# Patient Record
Sex: Male | Born: 1950 | Race: Black or African American | Hispanic: No | Marital: Single | Smoking: Former smoker
Health system: Southern US, Community
[De-identification: ages and names within clinical notes are randomized; demographics above are authoritative.]

## PROBLEM LIST (undated history)

## (undated) DIAGNOSIS — I639 Cerebral infarction, unspecified: Secondary | ICD-10-CM

## (undated) DIAGNOSIS — N2889 Other specified disorders of kidney and ureter: Secondary | ICD-10-CM

## (undated) DIAGNOSIS — F32A Depression, unspecified: Secondary | ICD-10-CM

## (undated) DIAGNOSIS — T4145XA Adverse effect of unspecified anesthetic, initial encounter: Secondary | ICD-10-CM

## (undated) DIAGNOSIS — C3490 Malignant neoplasm of unspecified part of unspecified bronchus or lung: Secondary | ICD-10-CM

## (undated) DIAGNOSIS — F329 Major depressive disorder, single episode, unspecified: Secondary | ICD-10-CM

## (undated) DIAGNOSIS — F419 Anxiety disorder, unspecified: Secondary | ICD-10-CM

## (undated) DIAGNOSIS — T8859XA Other complications of anesthesia, initial encounter: Secondary | ICD-10-CM

## (undated) HISTORY — PX: OTHER SURGICAL HISTORY: SHX169

## (undated) HISTORY — PX: COLONOSCOPY W/ BIOPSIES AND POLYPECTOMY: SHX1376

## (undated) HISTORY — DX: Malignant neoplasm of unspecified part of unspecified bronchus or lung: C34.90

## (undated) HISTORY — DX: Other specified disorders of kidney and ureter: N28.89

## (undated) HISTORY — PX: FOOT AMPUTATION: SHX951

## (undated) NOTE — *Deleted (*Deleted)
Pt had 7 beats of Vtach. Pt asymptomatic. On call provider notified.

---

## 2000-12-23 ENCOUNTER — Ambulatory Visit (HOSPITAL_COMMUNITY): Admission: RE | Admit: 2000-12-23 | Discharge: 2000-12-23 | Payer: Self-pay | Admitting: Orthopedic Surgery

## 2000-12-23 ENCOUNTER — Encounter: Payer: Self-pay | Admitting: Orthopedic Surgery

## 2001-05-15 ENCOUNTER — Encounter: Admission: RE | Admit: 2001-05-15 | Discharge: 2001-05-15 | Payer: Self-pay | Admitting: Orthopedic Surgery

## 2001-05-15 ENCOUNTER — Encounter: Payer: Self-pay | Admitting: Orthopedic Surgery

## 2007-11-22 ENCOUNTER — Encounter: Admission: RE | Admit: 2007-11-22 | Discharge: 2007-11-22 | Payer: Self-pay | Admitting: Gastroenterology

## 2008-01-31 ENCOUNTER — Emergency Department (HOSPITAL_COMMUNITY): Admission: EM | Admit: 2008-01-31 | Discharge: 2008-01-31 | Payer: Self-pay | Admitting: Emergency Medicine

## 2008-02-08 ENCOUNTER — Encounter: Payer: Self-pay | Admitting: Internal Medicine

## 2008-02-08 ENCOUNTER — Ambulatory Visit (HOSPITAL_COMMUNITY): Admission: RE | Admit: 2008-02-08 | Discharge: 2008-02-08 | Payer: Self-pay | Admitting: Internal Medicine

## 2008-02-09 ENCOUNTER — Ambulatory Visit: Payer: Self-pay | Admitting: Internal Medicine

## 2008-02-09 ENCOUNTER — Ambulatory Visit (HOSPITAL_COMMUNITY): Admission: RE | Admit: 2008-02-09 | Discharge: 2008-02-09 | Payer: Self-pay | Admitting: Internal Medicine

## 2008-02-09 DIAGNOSIS — R51 Headache: Secondary | ICD-10-CM | POA: Insufficient documentation

## 2008-02-09 DIAGNOSIS — I10 Essential (primary) hypertension: Secondary | ICD-10-CM | POA: Insufficient documentation

## 2008-02-09 DIAGNOSIS — J449 Chronic obstructive pulmonary disease, unspecified: Secondary | ICD-10-CM

## 2008-02-09 DIAGNOSIS — F329 Major depressive disorder, single episode, unspecified: Secondary | ICD-10-CM

## 2008-02-09 DIAGNOSIS — C3411 Malignant neoplasm of upper lobe, right bronchus or lung: Secondary | ICD-10-CM

## 2008-02-09 DIAGNOSIS — E785 Hyperlipidemia, unspecified: Secondary | ICD-10-CM | POA: Insufficient documentation

## 2008-02-09 DIAGNOSIS — R519 Headache, unspecified: Secondary | ICD-10-CM | POA: Insufficient documentation

## 2008-02-09 LAB — CONVERTED CEMR LAB: Prothrombin Time: 12.1 s (ref 10.9–13.3)

## 2008-02-12 ENCOUNTER — Ambulatory Visit: Admission: RE | Admit: 2008-02-12 | Discharge: 2008-02-12 | Payer: Self-pay | Admitting: Internal Medicine

## 2008-02-12 ENCOUNTER — Encounter: Payer: Self-pay | Admitting: Internal Medicine

## 2008-02-12 ENCOUNTER — Ambulatory Visit: Payer: Self-pay | Admitting: Internal Medicine

## 2008-02-13 ENCOUNTER — Ambulatory Visit: Payer: Self-pay | Admitting: Cardiovascular Disease

## 2008-02-14 ENCOUNTER — Ambulatory Visit: Payer: Self-pay | Admitting: Internal Medicine

## 2008-02-15 ENCOUNTER — Ambulatory Visit: Payer: Self-pay | Admitting: Internal Medicine

## 2008-02-15 ENCOUNTER — Encounter: Payer: Self-pay | Admitting: Internal Medicine

## 2008-02-16 ENCOUNTER — Ambulatory Visit: Admission: RE | Admit: 2008-02-16 | Discharge: 2008-04-22 | Payer: Self-pay | Admitting: Radiation Oncology

## 2008-02-26 LAB — CBC WITH DIFFERENTIAL/PLATELET
EOS%: 3.2 % (ref 0.0–7.0)
Eosinophils Absolute: 0.3 10*3/uL (ref 0.0–0.5)
MCH: 27.8 pg — ABNORMAL LOW (ref 28.0–33.4)
MCV: 85.4 fL (ref 81.6–98.0)
MONO%: 4.8 % (ref 0.0–13.0)
NEUT#: 7.1 10*3/uL — ABNORMAL HIGH (ref 1.5–6.5)
RBC: 4.43 10*6/uL (ref 4.20–5.71)
RDW: 14.9 % — ABNORMAL HIGH (ref 11.2–14.6)

## 2008-02-26 LAB — COMPREHENSIVE METABOLIC PANEL
ALT: 14 U/L (ref 0–53)
AST: 24 U/L (ref 0–37)
Alkaline Phosphatase: 118 U/L — ABNORMAL HIGH (ref 39–117)
Sodium: 135 mEq/L (ref 135–145)
Total Bilirubin: 0.4 mg/dL (ref 0.3–1.2)
Total Protein: 6.6 g/dL (ref 6.0–8.3)

## 2008-02-26 LAB — TECHNOLOGIST REVIEW

## 2008-03-04 LAB — COMPREHENSIVE METABOLIC PANEL
AST: 16 U/L (ref 0–37)
Albumin: 3.7 g/dL (ref 3.5–5.2)
Alkaline Phosphatase: 121 U/L — ABNORMAL HIGH (ref 39–117)
Potassium: 4.3 mEq/L (ref 3.5–5.3)
Sodium: 137 mEq/L (ref 135–145)
Total Protein: 7.5 g/dL (ref 6.0–8.3)

## 2008-03-04 LAB — CBC WITH DIFFERENTIAL/PLATELET
Basophils Absolute: 0 10*3/uL (ref 0.0–0.1)
EOS%: 1.9 % (ref 0.0–7.0)
HGB: 12.4 g/dL — ABNORMAL LOW (ref 13.0–17.1)
MCH: 27.8 pg — ABNORMAL LOW (ref 28.0–33.4)
MCV: 85.3 fL (ref 81.6–98.0)
MONO%: 6.6 % (ref 0.0–13.0)
RBC: 4.44 10*6/uL (ref 4.20–5.71)
RDW: 14.7 % — ABNORMAL HIGH (ref 11.2–14.6)

## 2008-03-04 LAB — TECHNOLOGIST REVIEW

## 2008-03-11 LAB — CBC WITH DIFFERENTIAL/PLATELET
BASO%: 0.6 % (ref 0.0–2.0)
LYMPH%: 13.7 % — ABNORMAL LOW (ref 14.0–48.0)
MCHC: 32.4 g/dL (ref 32.0–35.9)
MONO#: 0.2 10*3/uL (ref 0.1–0.9)
Platelets: 440 10*3/uL — ABNORMAL HIGH (ref 145–400)
RBC: 4.46 10*6/uL (ref 4.20–5.71)
WBC: 5.5 10*3/uL (ref 4.0–10.0)

## 2008-03-11 LAB — COMPREHENSIVE METABOLIC PANEL
ALT: 26 U/L (ref 0–53)
AST: 20 U/L (ref 0–37)
Alkaline Phosphatase: 119 U/L — ABNORMAL HIGH (ref 39–117)
CO2: 24 mEq/L (ref 19–32)
Sodium: 138 mEq/L (ref 135–145)
Total Bilirubin: 0.2 mg/dL — ABNORMAL LOW (ref 0.3–1.2)
Total Protein: 7.1 g/dL (ref 6.0–8.3)

## 2008-03-18 LAB — CBC WITH DIFFERENTIAL/PLATELET
BASO%: 0.1 % (ref 0.0–2.0)
EOS%: 1.3 % (ref 0.0–7.0)
LYMPH%: 8.8 % — ABNORMAL LOW (ref 14.0–48.0)
MCH: 28.3 pg (ref 28.0–33.4)
MCHC: 33.1 g/dL (ref 32.0–35.9)
MCV: 85.6 fL (ref 81.6–98.0)
MONO%: 2.5 % (ref 0.0–13.0)
Platelets: 389 10*3/uL (ref 145–400)
RBC: 4.41 10*6/uL (ref 4.20–5.71)
WBC: 4.5 10*3/uL (ref 4.0–10.0)

## 2008-03-18 LAB — COMPREHENSIVE METABOLIC PANEL
ALT: 15 U/L (ref 0–53)
Alkaline Phosphatase: 113 U/L (ref 39–117)
Creatinine, Ser: 0.68 mg/dL (ref 0.40–1.50)
Sodium: 135 mEq/L (ref 135–145)
Total Bilirubin: 0.3 mg/dL (ref 0.3–1.2)
Total Protein: 7.1 g/dL (ref 6.0–8.3)

## 2008-03-25 ENCOUNTER — Encounter: Payer: Self-pay | Admitting: Internal Medicine

## 2008-03-25 LAB — CBC WITH DIFFERENTIAL/PLATELET
Eosinophils Absolute: 0.1 10*3/uL (ref 0.0–0.5)
HCT: 35.9 % — ABNORMAL LOW (ref 38.4–49.9)
LYMPH%: 8.2 % — ABNORMAL LOW (ref 14.0–49.0)
MCHC: 32.9 g/dL (ref 32.0–36.0)
MCV: 83.3 fL (ref 79.3–98.0)
MONO%: 12.9 % (ref 0.0–14.0)
NEUT%: 76.6 % — ABNORMAL HIGH (ref 39.0–75.0)
Platelets: 201 10*3/uL (ref 140–400)
RBC: 4.31 10*6/uL (ref 4.20–5.82)
nRBC: 0 % (ref 0–0)

## 2008-03-25 LAB — COMPREHENSIVE METABOLIC PANEL
Albumin: 3.8 g/dL (ref 3.5–5.2)
Alkaline Phosphatase: 91 U/L (ref 39–117)
Chloride: 106 mEq/L (ref 96–112)
Glucose, Bld: 115 mg/dL — ABNORMAL HIGH (ref 70–99)
Potassium: 4.7 mEq/L (ref 3.5–5.3)
Sodium: 139 mEq/L (ref 135–145)
Total Protein: 6.9 g/dL (ref 6.0–8.3)

## 2008-04-01 ENCOUNTER — Ambulatory Visit: Payer: Self-pay | Admitting: Internal Medicine

## 2008-04-01 LAB — CBC WITH DIFFERENTIAL/PLATELET
BASO%: 0.9 % (ref 0.0–2.0)
Basophils Absolute: 0 10*3/uL (ref 0.0–0.1)
EOS%: 2.2 % (ref 0.0–7.0)
Eosinophils Absolute: 0.1 10*3/uL (ref 0.0–0.5)
HCT: 36.5 % — ABNORMAL LOW (ref 38.4–49.9)
HGB: 12.2 g/dL — ABNORMAL LOW (ref 13.0–17.1)
LYMPH%: 11.5 % — ABNORMAL LOW (ref 14.0–49.0)
MCH: 27.8 pg (ref 27.2–33.4)
MCHC: 33.4 g/dL (ref 32.0–36.0)
MCV: 83.1 fL (ref 79.3–98.0)
MONO#: 0.3 10*3/uL (ref 0.1–0.9)
MONO%: 9.6 % (ref 0.0–14.0)
NEUT#: 2.4 10*3/uL (ref 1.5–6.5)
NEUT%: 75.8 % — ABNORMAL HIGH (ref 39.0–75.0)
Platelets: 240 10*3/uL (ref 140–400)
RBC: 4.39 10*6/uL (ref 4.20–5.82)
RDW: 16.9 % — ABNORMAL HIGH (ref 11.0–14.6)
WBC: 3.2 10*3/uL — ABNORMAL LOW (ref 4.0–10.3)
lymph#: 0.4 10*3/uL — ABNORMAL LOW (ref 0.9–3.3)
nRBC: 0 % (ref 0–0)

## 2008-04-02 ENCOUNTER — Ambulatory Visit: Payer: Self-pay | Admitting: Internal Medicine

## 2008-04-02 DIAGNOSIS — F17200 Nicotine dependence, unspecified, uncomplicated: Secondary | ICD-10-CM | POA: Insufficient documentation

## 2008-04-08 ENCOUNTER — Encounter: Payer: Self-pay | Admitting: Internal Medicine

## 2008-04-08 LAB — COMPREHENSIVE METABOLIC PANEL
Albumin: 4 g/dL (ref 3.5–5.2)
Alkaline Phosphatase: 96 U/L (ref 39–117)
BUN: 9 mg/dL (ref 6–23)
Calcium: 8.8 mg/dL (ref 8.4–10.5)
Creatinine, Ser: 0.63 mg/dL (ref 0.40–1.50)
Glucose, Bld: 121 mg/dL — ABNORMAL HIGH (ref 70–99)
Potassium: 4 mEq/L (ref 3.5–5.3)

## 2008-04-08 LAB — CBC WITH DIFFERENTIAL/PLATELET
BASO%: 0.6 % (ref 0.0–2.0)
HCT: 36.2 % — ABNORMAL LOW (ref 38.4–49.9)
MCHC: 32.9 g/dL (ref 32.0–36.0)
MONO#: 0.2 10*3/uL (ref 0.1–0.9)
RBC: 4.32 10*6/uL (ref 4.20–5.82)
RDW: 17.8 % — ABNORMAL HIGH (ref 11.0–14.6)
WBC: 3.1 10*3/uL — ABNORMAL LOW (ref 4.0–10.3)
lymph#: 0.4 10*3/uL — ABNORMAL LOW (ref 0.9–3.3)
nRBC: 0 % (ref 0–0)

## 2008-04-22 ENCOUNTER — Encounter: Payer: Self-pay | Admitting: Internal Medicine

## 2008-04-22 LAB — CBC WITH DIFFERENTIAL/PLATELET
Basophils Absolute: 0 10*3/uL (ref 0.0–0.1)
Eosinophils Absolute: 0 10*3/uL (ref 0.0–0.5)
HGB: 12.6 g/dL — ABNORMAL LOW (ref 13.0–17.1)
MCV: 85.7 fL (ref 79.3–98.0)
NEUT#: 2.3 10*3/uL (ref 1.5–6.5)
RDW: 19.7 % — ABNORMAL HIGH (ref 11.0–14.6)
lymph#: 0.6 10*3/uL — ABNORMAL LOW (ref 0.9–3.3)

## 2008-04-22 LAB — COMPREHENSIVE METABOLIC PANEL
Albumin: 4 g/dL (ref 3.5–5.2)
CO2: 27 mEq/L (ref 19–32)
Glucose, Bld: 90 mg/dL (ref 70–99)
Potassium: 4.2 mEq/L (ref 3.5–5.3)
Sodium: 137 mEq/L (ref 135–145)
Total Protein: 7 g/dL (ref 6.0–8.3)

## 2008-05-08 ENCOUNTER — Ambulatory Visit (HOSPITAL_COMMUNITY): Admission: RE | Admit: 2008-05-08 | Discharge: 2008-05-08 | Payer: Self-pay | Admitting: Internal Medicine

## 2008-05-13 ENCOUNTER — Encounter: Payer: Self-pay | Admitting: Internal Medicine

## 2008-05-13 LAB — CBC WITH DIFFERENTIAL/PLATELET
BASO%: 0.1 % (ref 0.0–2.0)
Basophils Absolute: 0 10*3/uL (ref 0.0–0.1)
EOS%: 3.4 % (ref 0.0–7.0)
Eosinophils Absolute: 0.2 10*3/uL (ref 0.0–0.5)
HCT: 41.1 % (ref 38.4–49.9)
HGB: 13.5 g/dL (ref 13.0–17.1)
LYMPH%: 13.6 % — ABNORMAL LOW (ref 14.0–49.0)
MCH: 29.9 pg (ref 27.2–33.4)
MCHC: 32.8 g/dL (ref 32.0–36.0)
MCV: 90.9 fL (ref 79.3–98.0)
MONO#: 0.7 10*3/uL (ref 0.1–0.9)
MONO%: 12.8 % (ref 0.0–14.0)
NEUT#: 3.6 10*3/uL (ref 1.5–6.5)
NEUT%: 70.1 % (ref 39.0–75.0)
Platelets: 406 10*3/uL — ABNORMAL HIGH (ref 140–400)
RBC: 4.52 10*6/uL (ref 4.20–5.82)
RDW: 22 % — ABNORMAL HIGH (ref 11.0–14.6)
WBC: 5.2 10*3/uL (ref 4.0–10.3)
lymph#: 0.7 10*3/uL — ABNORMAL LOW (ref 0.9–3.3)

## 2008-05-13 LAB — COMPREHENSIVE METABOLIC PANEL
Albumin: 4.1 g/dL (ref 3.5–5.2)
BUN: 10 mg/dL (ref 6–23)
CO2: 25 mEq/L (ref 19–32)
Calcium: 9.5 mg/dL (ref 8.4–10.5)
Chloride: 104 mEq/L (ref 96–112)
Creatinine, Ser: 0.85 mg/dL (ref 0.40–1.50)
Glucose, Bld: 78 mg/dL (ref 70–99)
Potassium: 4.4 mEq/L (ref 3.5–5.3)

## 2008-05-15 ENCOUNTER — Ambulatory Visit: Payer: Self-pay | Admitting: Internal Medicine

## 2008-05-20 ENCOUNTER — Ambulatory Visit: Payer: Self-pay | Admitting: Internal Medicine

## 2008-05-22 LAB — COMPREHENSIVE METABOLIC PANEL
Albumin: 3.9 g/dL (ref 3.5–5.2)
BUN: 7 mg/dL (ref 6–23)
CO2: 26 mEq/L (ref 19–32)
Glucose, Bld: 89 mg/dL (ref 70–99)
Potassium: 4.4 mEq/L (ref 3.5–5.3)
Sodium: 138 mEq/L (ref 135–145)
Total Protein: 6.6 g/dL (ref 6.0–8.3)

## 2008-05-22 LAB — CBC WITH DIFFERENTIAL/PLATELET
BASO%: 0 % (ref 0.0–2.0)
Basophils Absolute: 0 10*3/uL (ref 0.0–0.1)
EOS%: 0.3 % (ref 0.0–7.0)
HCT: 38.1 % — ABNORMAL LOW (ref 38.4–49.9)
HGB: 12.4 g/dL — ABNORMAL LOW (ref 13.0–17.1)
MONO#: 1.2 10*3/uL — ABNORMAL HIGH (ref 0.1–0.9)
NEUT%: 90.5 % — ABNORMAL HIGH (ref 39.0–75.0)
RDW: 21.6 % — ABNORMAL HIGH (ref 11.0–14.6)
WBC: 22 10*3/uL — ABNORMAL HIGH (ref 4.0–10.3)
lymph#: 0.8 10*3/uL — ABNORMAL LOW (ref 0.9–3.3)

## 2008-05-29 LAB — CBC WITH DIFFERENTIAL/PLATELET
Eosinophils Absolute: 0 10*3/uL (ref 0.0–0.5)
MONO#: 0.2 10*3/uL (ref 0.1–0.9)
NEUT#: 7.4 10*3/uL — ABNORMAL HIGH (ref 1.5–6.5)
RBC: 4.35 10*6/uL (ref 4.20–5.82)
RDW: 21.4 % — ABNORMAL HIGH (ref 11.0–14.6)
WBC: 8.4 10*3/uL (ref 4.0–10.3)
lymph#: 0.8 10*3/uL — ABNORMAL LOW (ref 0.9–3.3)

## 2008-05-29 LAB — COMPREHENSIVE METABOLIC PANEL
Albumin: 4.1 g/dL (ref 3.5–5.2)
Alkaline Phosphatase: 117 U/L (ref 39–117)
CO2: 24 mEq/L (ref 19–32)
Chloride: 105 mEq/L (ref 96–112)
Glucose, Bld: 81 mg/dL (ref 70–99)
Potassium: 4.4 mEq/L (ref 3.5–5.3)
Sodium: 138 mEq/L (ref 135–145)
Total Protein: 6.7 g/dL (ref 6.0–8.3)

## 2008-06-05 ENCOUNTER — Encounter: Payer: Self-pay | Admitting: Internal Medicine

## 2008-06-05 LAB — COMPREHENSIVE METABOLIC PANEL
ALT: 20 U/L (ref 0–53)
AST: 17 U/L (ref 0–37)
Albumin: 4.2 g/dL (ref 3.5–5.2)
Calcium: 9.3 mg/dL (ref 8.4–10.5)
Chloride: 105 mEq/L (ref 96–112)
Potassium: 4.7 mEq/L (ref 3.5–5.3)
Sodium: 136 mEq/L (ref 135–145)

## 2008-06-05 LAB — CBC WITH DIFFERENTIAL/PLATELET
Basophils Absolute: 0 10*3/uL (ref 0.0–0.1)
Eosinophils Absolute: 0 10*3/uL (ref 0.0–0.5)
HCT: 35.8 % — ABNORMAL LOW (ref 38.4–49.9)
LYMPH%: 4.1 % — ABNORMAL LOW (ref 14.0–49.0)
MCV: 88 fL (ref 79.3–98.0)
MONO#: 0.4 10*3/uL (ref 0.1–0.9)
MONO%: 3.1 % (ref 0.0–14.0)
NEUT#: 10.4 10*3/uL — ABNORMAL HIGH (ref 1.5–6.5)
NEUT%: 92.8 % — ABNORMAL HIGH (ref 39.0–75.0)
Platelets: 301 10*3/uL (ref 140–400)
RBC: 4.07 10*6/uL — ABNORMAL LOW (ref 4.20–5.82)
WBC: 11.2 10*3/uL — ABNORMAL HIGH (ref 4.0–10.3)

## 2008-06-12 LAB — CBC WITH DIFFERENTIAL/PLATELET
BASO%: 0.3 % (ref 0.0–2.0)
EOS%: 0.4 % (ref 0.0–7.0)
MCH: 31 pg (ref 27.2–33.4)
MCHC: 32.8 g/dL (ref 32.0–36.0)
MONO%: 8.5 % (ref 0.0–14.0)
NEUT%: 83.6 % — ABNORMAL HIGH (ref 39.0–75.0)
RDW: 19.7 % — ABNORMAL HIGH (ref 11.0–14.6)
lymph#: 1.1 10*3/uL (ref 0.9–3.3)

## 2008-06-12 LAB — COMPREHENSIVE METABOLIC PANEL
AST: 14 U/L (ref 0–37)
Albumin: 3.7 g/dL (ref 3.5–5.2)
Alkaline Phosphatase: 121 U/L — ABNORMAL HIGH (ref 39–117)
BUN: 6 mg/dL (ref 6–23)
Potassium: 4.2 mEq/L (ref 3.5–5.3)
Sodium: 138 mEq/L (ref 135–145)
Total Bilirubin: 0.2 mg/dL — ABNORMAL LOW (ref 0.3–1.2)

## 2008-06-19 LAB — COMPREHENSIVE METABOLIC PANEL
AST: 14 U/L (ref 0–37)
Alkaline Phosphatase: 147 U/L — ABNORMAL HIGH (ref 39–117)
BUN: 13 mg/dL (ref 6–23)
Creatinine, Ser: 0.96 mg/dL (ref 0.40–1.50)
Glucose, Bld: 92 mg/dL (ref 70–99)
Potassium: 4.7 mEq/L (ref 3.5–5.3)
Total Bilirubin: 0.2 mg/dL — ABNORMAL LOW (ref 0.3–1.2)

## 2008-06-19 LAB — CBC WITH DIFFERENTIAL/PLATELET
Basophils Absolute: 0 10*3/uL (ref 0.0–0.1)
EOS%: 0.1 % (ref 0.0–7.0)
HGB: 12.9 g/dL — ABNORMAL LOW (ref 13.0–17.1)
LYMPH%: 7.2 % — ABNORMAL LOW (ref 14.0–49.0)
MCH: 30.9 pg (ref 27.2–33.4)
MCV: 94.8 fL (ref 79.3–98.0)
MONO%: 3.9 % (ref 0.0–14.0)
NEUT%: 88.6 % — ABNORMAL HIGH (ref 39.0–75.0)
Platelets: 302 10*3/uL (ref 140–400)
RDW: 19.3 % — ABNORMAL HIGH (ref 11.0–14.6)

## 2008-06-26 ENCOUNTER — Encounter: Payer: Self-pay | Admitting: Internal Medicine

## 2008-06-26 LAB — CBC WITH DIFFERENTIAL/PLATELET
Basophils Absolute: 0 10*3/uL (ref 0.0–0.1)
EOS%: 1 % (ref 0.0–7.0)
HCT: 38.4 % (ref 38.4–49.9)
HGB: 12.6 g/dL — ABNORMAL LOW (ref 13.0–17.1)
MCH: 30 pg (ref 27.2–33.4)
MCV: 91.4 fL (ref 79.3–98.0)
MONO%: 16.4 % — ABNORMAL HIGH (ref 0.0–14.0)
NEUT%: 68.5 % (ref 39.0–75.0)
lymph#: 0.8 10*3/uL — ABNORMAL LOW (ref 0.9–3.3)

## 2008-06-26 LAB — COMPREHENSIVE METABOLIC PANEL
AST: 13 U/L (ref 0–37)
BUN: 6 mg/dL (ref 6–23)
Calcium: 8.4 mg/dL (ref 8.4–10.5)
Chloride: 108 mEq/L (ref 96–112)
Creatinine, Ser: 0.75 mg/dL (ref 0.40–1.50)
Glucose, Bld: 108 mg/dL — ABNORMAL HIGH (ref 70–99)

## 2008-06-28 ENCOUNTER — Ambulatory Visit: Payer: Self-pay | Admitting: Internal Medicine

## 2008-07-03 LAB — CBC WITH DIFFERENTIAL/PLATELET
Basophils Absolute: 0 10*3/uL (ref 0.0–0.1)
Eosinophils Absolute: 0.2 10*3/uL (ref 0.0–0.5)
HGB: 12.4 g/dL — ABNORMAL LOW (ref 13.0–17.1)
MONO#: 1.1 10*3/uL — ABNORMAL HIGH (ref 0.1–0.9)
NEUT#: 24.7 10*3/uL — ABNORMAL HIGH (ref 1.5–6.5)
Platelets: 364 10*3/uL (ref 140–400)
RBC: 3.93 10*6/uL — ABNORMAL LOW (ref 4.20–5.82)
RDW: 16.1 % — ABNORMAL HIGH (ref 11.0–14.6)
WBC: 26.8 10*3/uL — ABNORMAL HIGH (ref 4.0–10.3)

## 2008-07-03 LAB — COMPREHENSIVE METABOLIC PANEL
Albumin: 3.8 g/dL (ref 3.5–5.2)
BUN: 11 mg/dL (ref 6–23)
CO2: 22 mEq/L (ref 19–32)
Calcium: 8.9 mg/dL (ref 8.4–10.5)
Glucose, Bld: 92 mg/dL (ref 70–99)
Potassium: 4.4 mEq/L (ref 3.5–5.3)
Sodium: 136 mEq/L (ref 135–145)
Total Protein: 6.5 g/dL (ref 6.0–8.3)

## 2008-07-10 LAB — CBC WITH DIFFERENTIAL/PLATELET
BASO%: 0.4 % (ref 0.0–2.0)
Basophils Absolute: 0 10*3/uL (ref 0.0–0.1)
EOS%: 0.8 % (ref 0.0–7.0)
Eosinophils Absolute: 0.1 10*3/uL (ref 0.0–0.5)
HCT: 37.7 % — ABNORMAL LOW (ref 38.4–49.9)
HGB: 12.6 g/dL — ABNORMAL LOW (ref 13.0–17.1)
LYMPH%: 14.1 % (ref 14.0–49.0)
MCH: 30.4 pg (ref 27.2–33.4)
MCHC: 33.4 g/dL (ref 32.0–36.0)
MCV: 90.8 fL (ref 79.3–98.0)
MONO#: 0.7 10*3/uL (ref 0.1–0.9)
MONO%: 8 % (ref 0.0–14.0)
NEUT#: 7.1 10*3/uL — ABNORMAL HIGH (ref 1.5–6.5)
NEUT%: 76.7 % — ABNORMAL HIGH (ref 39.0–75.0)
Platelets: 316 10*3/uL (ref 140–400)
RBC: 4.15 10*6/uL — ABNORMAL LOW (ref 4.20–5.82)
RDW: 15.7 % — ABNORMAL HIGH (ref 11.0–14.6)
WBC: 9.2 10*3/uL (ref 4.0–10.3)
lymph#: 1.3 10*3/uL (ref 0.9–3.3)

## 2008-07-10 LAB — COMPREHENSIVE METABOLIC PANEL
AST: 16 U/L (ref 0–37)
Alkaline Phosphatase: 137 U/L — ABNORMAL HIGH (ref 39–117)
BUN: 9 mg/dL (ref 6–23)
Creatinine, Ser: 0.83 mg/dL (ref 0.40–1.50)
Potassium: 4.4 mEq/L (ref 3.5–5.3)
Total Bilirubin: 0.3 mg/dL (ref 0.3–1.2)

## 2008-07-15 ENCOUNTER — Ambulatory Visit (HOSPITAL_COMMUNITY): Admission: RE | Admit: 2008-07-15 | Discharge: 2008-07-15 | Payer: Self-pay | Admitting: Internal Medicine

## 2008-07-17 ENCOUNTER — Encounter: Payer: Self-pay | Admitting: Internal Medicine

## 2008-07-17 LAB — CBC WITH DIFFERENTIAL/PLATELET
Basophils Absolute: 0 10*3/uL (ref 0.0–0.1)
EOS%: 0 % (ref 0.0–7.0)
Eosinophils Absolute: 0 10*3/uL (ref 0.0–0.5)
HCT: 35.1 % — ABNORMAL LOW (ref 38.4–49.9)
HGB: 11.9 g/dL — ABNORMAL LOW (ref 13.0–17.1)
MCH: 30.6 pg (ref 27.2–33.4)
MCV: 90.2 fL (ref 79.3–98.0)
MONO%: 6.5 % (ref 0.0–14.0)
NEUT#: 6.9 10*3/uL — ABNORMAL HIGH (ref 1.5–6.5)
NEUT%: 86.9 % — ABNORMAL HIGH (ref 39.0–75.0)
Platelets: 331 10*3/uL (ref 140–400)

## 2008-07-24 LAB — CBC WITH DIFFERENTIAL/PLATELET
BASO%: 0.3 % (ref 0.0–2.0)
Eosinophils Absolute: 0.5 10*3/uL (ref 0.0–0.5)
HCT: 37 % — ABNORMAL LOW (ref 38.4–49.9)
LYMPH%: 9.5 % — ABNORMAL LOW (ref 14.0–49.0)
MCHC: 34.1 g/dL (ref 32.0–36.0)
MCV: 96.4 fL (ref 79.3–98.0)
MONO#: 0.5 10*3/uL (ref 0.1–0.9)
MONO%: 7.4 % (ref 0.0–14.0)
NEUT%: 75.6 % — ABNORMAL HIGH (ref 39.0–75.0)
Platelets: 434 10*3/uL — ABNORMAL HIGH (ref 140–400)
RBC: 3.84 10*6/uL — ABNORMAL LOW (ref 4.20–5.82)

## 2008-07-24 LAB — COMPREHENSIVE METABOLIC PANEL
Alkaline Phosphatase: 93 U/L (ref 39–117)
CO2: 22 mEq/L (ref 19–32)
Creatinine, Ser: 0.97 mg/dL (ref 0.40–1.50)
Glucose, Bld: 99 mg/dL (ref 70–99)
Sodium: 137 mEq/L (ref 135–145)
Total Bilirubin: 0.3 mg/dL (ref 0.3–1.2)
Total Protein: 6.3 g/dL (ref 6.0–8.3)

## 2008-10-11 ENCOUNTER — Ambulatory Visit: Payer: Self-pay | Admitting: Internal Medicine

## 2008-10-15 ENCOUNTER — Ambulatory Visit (HOSPITAL_COMMUNITY): Admission: RE | Admit: 2008-10-15 | Discharge: 2008-10-15 | Payer: Self-pay | Admitting: Internal Medicine

## 2008-10-15 LAB — CBC WITH DIFFERENTIAL/PLATELET
Basophils Absolute: 0 10*3/uL (ref 0.0–0.1)
EOS%: 2.7 % (ref 0.0–7.0)
Eosinophils Absolute: 0.1 10*3/uL (ref 0.0–0.5)
HCT: 43.4 % (ref 38.4–49.9)
HGB: 15 g/dL (ref 13.0–17.1)
MCH: 29.2 pg (ref 27.2–33.4)
MCV: 84.4 fL (ref 79.3–98.0)
MONO%: 7.6 % (ref 0.0–14.0)
NEUT#: 2.5 10*3/uL (ref 1.5–6.5)
NEUT%: 61.1 % (ref 39.0–75.0)
Platelets: 335 10*3/uL (ref 140–400)
RDW: 13.9 % (ref 11.0–14.6)

## 2008-10-15 LAB — COMPREHENSIVE METABOLIC PANEL
Albumin: 3.8 g/dL (ref 3.5–5.2)
BUN: 12 mg/dL (ref 6–23)
CO2: 28 mEq/L (ref 19–32)
Glucose, Bld: 90 mg/dL (ref 70–99)
Sodium: 138 mEq/L (ref 135–145)
Total Bilirubin: 0.4 mg/dL (ref 0.3–1.2)
Total Protein: 7 g/dL (ref 6.0–8.3)

## 2008-10-17 ENCOUNTER — Encounter: Payer: Self-pay | Admitting: Internal Medicine

## 2009-01-10 ENCOUNTER — Ambulatory Visit: Payer: Self-pay | Admitting: Internal Medicine

## 2009-01-14 ENCOUNTER — Ambulatory Visit (HOSPITAL_COMMUNITY): Admission: RE | Admit: 2009-01-14 | Discharge: 2009-01-14 | Payer: Self-pay | Admitting: Internal Medicine

## 2009-01-14 LAB — CBC WITH DIFFERENTIAL/PLATELET
Eosinophils Absolute: 0.2 10*3/uL (ref 0.0–0.5)
HGB: 15.9 g/dL (ref 13.0–17.1)
MCV: 90.2 fL (ref 79.3–98.0)
MONO%: 5.8 % (ref 0.0–14.0)
NEUT#: 6.2 10*3/uL (ref 1.5–6.5)
RBC: 5.35 10*6/uL (ref 4.20–5.82)
RDW: 15 % — ABNORMAL HIGH (ref 11.0–14.6)
WBC: 7.7 10*3/uL (ref 4.0–10.3)
lymph#: 0.8 10*3/uL — ABNORMAL LOW (ref 0.9–3.3)

## 2009-01-14 LAB — COMPREHENSIVE METABOLIC PANEL
AST: 19 U/L (ref 0–37)
Alkaline Phosphatase: 102 U/L (ref 39–117)
BUN: 10 mg/dL (ref 6–23)
Creatinine, Ser: 0.85 mg/dL (ref 0.40–1.50)
Glucose, Bld: 100 mg/dL — ABNORMAL HIGH (ref 70–99)
Potassium: 4.2 mEq/L (ref 3.5–5.3)
Total Bilirubin: 0.6 mg/dL (ref 0.3–1.2)

## 2009-01-21 ENCOUNTER — Encounter: Payer: Self-pay | Admitting: Internal Medicine

## 2009-02-27 ENCOUNTER — Telehealth (INDEPENDENT_AMBULATORY_CARE_PROVIDER_SITE_OTHER): Payer: Self-pay | Admitting: *Deleted

## 2009-03-10 ENCOUNTER — Telehealth: Payer: Self-pay | Admitting: Internal Medicine

## 2009-03-25 ENCOUNTER — Ambulatory Visit: Payer: Self-pay | Admitting: Internal Medicine

## 2009-04-14 ENCOUNTER — Ambulatory Visit: Payer: Self-pay | Admitting: Internal Medicine

## 2009-04-21 ENCOUNTER — Encounter: Payer: Self-pay | Admitting: Internal Medicine

## 2009-07-16 ENCOUNTER — Ambulatory Visit: Payer: Self-pay | Admitting: Internal Medicine

## 2009-07-18 ENCOUNTER — Ambulatory Visit (HOSPITAL_COMMUNITY): Admission: RE | Admit: 2009-07-18 | Discharge: 2009-07-18 | Payer: Self-pay | Admitting: Internal Medicine

## 2009-07-18 LAB — CBC WITH DIFFERENTIAL/PLATELET
EOS%: 5.7 % (ref 0.0–7.0)
MCHC: 33.6 g/dL (ref 32.0–36.0)
MONO#: 0.5 10*3/uL (ref 0.1–0.9)
MONO%: 9 % (ref 0.0–14.0)
NEUT%: 60.9 % (ref 39.0–75.0)
RBC: 4.83 10*6/uL (ref 4.20–5.82)
RDW: 13.5 % (ref 11.0–14.6)
WBC: 5.3 10*3/uL (ref 4.0–10.3)
lymph#: 1.3 10*3/uL (ref 0.9–3.3)

## 2009-07-18 LAB — COMPREHENSIVE METABOLIC PANEL
Albumin: 3.7 g/dL (ref 3.5–5.2)
Alkaline Phosphatase: 88 U/L (ref 39–117)
BUN: 6 mg/dL (ref 6–23)
CO2: 25 mEq/L (ref 19–32)
Glucose, Bld: 100 mg/dL — ABNORMAL HIGH (ref 70–99)
Total Bilirubin: 0.6 mg/dL (ref 0.3–1.2)
Total Protein: 7.1 g/dL (ref 6.0–8.3)

## 2009-07-22 ENCOUNTER — Encounter: Payer: Self-pay | Admitting: Internal Medicine

## 2009-10-15 ENCOUNTER — Ambulatory Visit: Payer: Self-pay | Admitting: Internal Medicine

## 2009-10-15 ENCOUNTER — Ambulatory Visit (HOSPITAL_COMMUNITY): Admission: RE | Admit: 2009-10-15 | Discharge: 2009-10-15 | Payer: Self-pay | Admitting: Internal Medicine

## 2009-10-15 LAB — CBC WITH DIFFERENTIAL/PLATELET
Basophils Absolute: 0 10*3/uL (ref 0.0–0.1)
Eosinophils Absolute: 0.2 10*3/uL (ref 0.0–0.5)
HCT: 46.3 % (ref 38.4–49.9)
HGB: 15.3 g/dL (ref 13.0–17.1)
LYMPH%: 25.1 % (ref 14.0–49.0)
MCV: 88.7 fL (ref 79.3–98.0)
MONO#: 0.4 10*3/uL (ref 0.1–0.9)
MONO%: 7.6 % (ref 0.0–14.0)
NEUT#: 2.9 10*3/uL (ref 1.5–6.5)
NEUT%: 62.2 % (ref 39.0–75.0)
Platelets: 346 10*3/uL (ref 140–400)
RBC: 5.22 10*6/uL (ref 4.20–5.82)
WBC: 4.6 10*3/uL (ref 4.0–10.3)
nRBC: 0 % (ref 0–0)

## 2009-10-15 LAB — COMPREHENSIVE METABOLIC PANEL
ALT: 22 U/L (ref 0–53)
CO2: 25 mEq/L (ref 19–32)
Calcium: 9.5 mg/dL (ref 8.4–10.5)
Chloride: 105 mEq/L (ref 96–112)
Creatinine, Ser: 0.9 mg/dL (ref 0.40–1.50)
Glucose, Bld: 116 mg/dL — ABNORMAL HIGH (ref 70–99)
Total Bilirubin: 0.2 mg/dL — ABNORMAL LOW (ref 0.3–1.2)

## 2009-10-22 ENCOUNTER — Encounter: Payer: Self-pay | Admitting: Internal Medicine

## 2009-10-22 LAB — CBC WITH DIFFERENTIAL/PLATELET
BASO%: 0.6 % (ref 0.0–2.0)
Eosinophils Absolute: 0.2 10*3/uL (ref 0.0–0.5)
HCT: 46 % (ref 38.4–49.9)
MCHC: 33.2 g/dL (ref 32.0–36.0)
MONO#: 0.3 10*3/uL (ref 0.1–0.9)
NEUT#: 3.5 10*3/uL (ref 1.5–6.5)
NEUT%: 68.1 % (ref 39.0–75.0)
RBC: 5.07 10*6/uL (ref 4.20–5.82)
WBC: 5.2 10*3/uL (ref 4.0–10.3)
lymph#: 1.1 10*3/uL (ref 0.9–3.3)

## 2009-10-22 LAB — COMPREHENSIVE METABOLIC PANEL
ALT: 17 U/L (ref 0–53)
Albumin: 4.1 g/dL (ref 3.5–5.2)
CO2: 27 mEq/L (ref 19–32)
Calcium: 9.4 mg/dL (ref 8.4–10.5)
Chloride: 106 mEq/L (ref 96–112)
Glucose, Bld: 98 mg/dL (ref 70–99)
Sodium: 140 mEq/L (ref 135–145)
Total Protein: 6.8 g/dL (ref 6.0–8.3)

## 2010-02-09 ENCOUNTER — Ambulatory Visit: Payer: Self-pay | Admitting: Internal Medicine

## 2010-02-11 ENCOUNTER — Ambulatory Visit (HOSPITAL_COMMUNITY)
Admission: RE | Admit: 2010-02-11 | Discharge: 2010-02-11 | Payer: Self-pay | Source: Home / Self Care | Attending: Internal Medicine | Admitting: Internal Medicine

## 2010-02-11 LAB — CMP (CANCER CENTER ONLY)
ALT(SGPT): 20 U/L (ref 10–47)
AST: 21 U/L (ref 11–38)
Albumin: 3.6 g/dL (ref 3.3–5.5)
Alkaline Phosphatase: 97 U/L — ABNORMAL HIGH (ref 26–84)
BUN, Bld: 11 mg/dL (ref 7–22)
CO2: 28 mEq/L (ref 18–33)
Calcium: 9 mg/dL (ref 8.0–10.3)
Chloride: 101 mEq/L (ref 98–108)
Creat: 0.9 mg/dl (ref 0.6–1.2)
Glucose, Bld: 95 mg/dL (ref 73–118)
Potassium: 4.8 mEq/L — ABNORMAL HIGH (ref 3.3–4.7)
Sodium: 137 mEq/L (ref 128–145)
Total Bilirubin: 0.5 mg/dl (ref 0.20–1.60)
Total Protein: 7.6 g/dL (ref 6.4–8.1)

## 2010-02-11 LAB — CBC WITH DIFFERENTIAL/PLATELET
BASO%: 0.8 % (ref 0.0–2.0)
Basophils Absolute: 0 10*3/uL (ref 0.0–0.1)
EOS%: 3.1 % (ref 0.0–7.0)
Eosinophils Absolute: 0.2 10*3/uL (ref 0.0–0.5)
HCT: 44.8 % (ref 38.4–49.9)
HGB: 15.2 g/dL (ref 13.0–17.1)
LYMPH%: 25.3 % (ref 14.0–49.0)
MCH: 29.9 pg (ref 27.2–33.4)
MCHC: 33.9 g/dL (ref 32.0–36.0)
MCV: 88.2 fL (ref 79.3–98.0)
MONO#: 0.4 10*3/uL (ref 0.1–0.9)
MONO%: 8.4 % (ref 0.0–14.0)
NEUT#: 3.2 10*3/uL (ref 1.5–6.5)
NEUT%: 62.4 % (ref 39.0–75.0)
Platelets: 334 10*3/uL (ref 140–400)
RBC: 5.08 10*6/uL (ref 4.20–5.82)
RDW: 13.8 % (ref 11.0–14.6)
WBC: 5.1 10*3/uL (ref 4.0–10.3)
lymph#: 1.3 10*3/uL (ref 0.9–3.3)

## 2010-02-17 ENCOUNTER — Encounter: Payer: Self-pay | Admitting: Internal Medicine

## 2010-02-21 ENCOUNTER — Other Ambulatory Visit: Payer: Self-pay | Admitting: Internal Medicine

## 2010-02-21 DIAGNOSIS — C3490 Malignant neoplasm of unspecified part of unspecified bronchus or lung: Secondary | ICD-10-CM

## 2010-02-22 ENCOUNTER — Encounter: Payer: Self-pay | Admitting: Internal Medicine
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
## 2010-03-05 NOTE — Letter (Signed)
Summary: [facility]  [facility]   Imported By: Sherian Rein 11/26/2009 14:45:22  _____________________________________________________________________  External Attachment:    Type:   Image     Comment:   External Document

## 2010-03-05 NOTE — Letter (Signed)
[facility]   Imported By: Sherian Rein 05/19/2009 10:14:21  _____________________________________________________________________  External Attachment:    Type:   Image     Comment:   External Document

## 2010-03-05 NOTE — Progress Notes (Signed)
Summary: fu needed  Phone Note Outgoing Call   Summary of Call: Cammie Mcgee stage 2 copd. Not seen since april 2010. Pls give fu Initial call taken by: Kalman Shan MD,  March 10, 2009 4:37 PM  Follow-up for Phone Call        [facility] to schedule appt.Carron Curie CMA  March 11, 2009 2:36 PM pt schedueld to see MR on 03/25/09. Carron Curie CMA  March 14, 2009 12:34 PM

## 2010-03-05 NOTE — Letter (Signed)
[facility]   Imported By: Lester [place] 08/12/2009 09:02:10  _____________________________________________________________________  External Attachment:    Type:   Image     Comment:   External Document

## 2010-03-05 NOTE — Progress Notes (Signed)
Summary: Record request  Request for records received from [facility]. Request forwarded to [facility]. Dena Chavis  February 27, 2009 1:26 PM

## 2010-03-05 NOTE — Letter (Signed)
[facility]   Imported By: Lester [place] 03/14/2009 09:44:37  _____________________________________________________________________  External Attachment:    Type:   Image     Comment:   External Document

## 2010-03-25 NOTE — Letter (Signed)
Summary: [facility]  [facility]   Imported By: Lennie Odor 03/19/2010 17:14:51  _____________________________________________________________________  External Attachment:    Type:   Image     Comment:   External Document

## 2010-04-16 LAB — POCT I-STAT, CHEM 8
Chloride: 104 mEq/L (ref 96–112)
Creatinine, Ser: 0.9 mg/dL (ref 0.4–1.5)
Glucose, Bld: 117 mg/dL — ABNORMAL HIGH (ref 70–99)
HCT: 51 % (ref 39.0–52.0)
Hemoglobin: 17.3 g/dL — ABNORMAL HIGH (ref 13.0–17.0)
Potassium: 4.1 mEq/L (ref 3.5–5.1)
Sodium: 139 mEq/L (ref 135–145)

## 2010-06-16 NOTE — Op Note (Signed)
Evan [name], Evan [name]               ACCOUNT NO.:  [id number]   MEDICAL RECORD NO.:  [id number]          PATIENT TYPE:  AMB   LOCATION:  CARD                         FACILITY:  [facility]   PHYSICIAN:  Kalman Shan, MD   DATE OF BIRTH:  07-Feb-1950   DATE OF PROCEDURE:  02/12/2008  DATE OF DISCHARGE:                               OPERATIVE REPORT   TYPE OF PROCEDURE:  Bronchoscopy with transbronchial needle aspiration  and transbronchial biopsy.   INDICATIONS:  Extensor right upper lobe mass and pretracheal and  precarinal lymphadenopathy suspicious for lung cancer.   CONSENT:  Signed informed consent obtained.   PREPROCEDURE ASSESSMENT:  The history and physical was completed less  than 30 days ago.  No interim changes noted.  Is currently NPO.  Denies  complaints.  Of note, he had significant gagging during preparation with  lidocaine topical anesthesia.   PHYSICAL ASSESSMENT:  Height 65 inches, weight 122 pounds, blood  pressure 130/85, pulse of 75, respiratory rate 18.  ASA class II. Upper  airway Mallampati class I to II.  Physical exam was within normal  limits.   PREPROCEDURE ASSESSMENT:  The patient was considered acceptable for IV  moderate sedation, could undergo procedure. Oral route was going to be  used because of significant gagging.  Therefore, we anticipate a lot of  coughing  and difficulty with the procedure.  Maximum low dose of  lidocaine was between 320 and 430 mg.   Preprocedure sedation planned was of topical fentanyl and Versed  moderate sedation.  Goal sedation as RA sedation score of -2. Maximum  dose lidocaine was between 330 and 430 mg.   PROCEDURE NOTE:  The scope was introduced through the oral route at 1:50  p.m.  Immediately the patient coughed.  A lot of lidocaine was applied  on the hypopharynx and the epiglottis and the vocal cords for over 5  minutes.  Vocal cords were normal.  The trachea was entered but again he  started coughing.  The  trachea was normal.  The carina was sharp.  Detailed upper airway exam was done.  This showed endobronchial lesion  in the right upper lobe apical segment entrance.  There was no obvious  bleeding.  Any instrumentation in any of the airways always resulted in  cough despite lidocaine application.   Following this, the transbronchial needle was introduced into the  precarinal area.  A good core was obtained in the precarinal area.  Attention was turned towards the right pretracheal area.  That goal was  to introduce the needle between 1 o'clock and 3 o'clock positions.  Sample was obtained and a needle was withdrawn.  At this point the  patient coughed violently lifting head out of bed.  This then resulted  in the scope and the needle getting dislodged and almost entering  the  right lower lobe because of cough and movements of the patient.  Some  bleeding noticed from the right lower lobe entrance.  This settled  immediately after application of cold saline.   After this attention was turned to the right upper lobe  endobronchial  lesion and the right upper lobe mass.  Under fluoroscopy needle  transbronchial needle aspirates x2 were done.  There is significant  difficulty again putting the needle past the scope due to the awkward  angle of the scope.  Nevertheless, we got two biopsy pieces.  Then  endobronchial biopsies x4 were obtained from the mass under fluoroscopic  guidance.   Following this, again attempt was made to get one more pretracheal node  but as soon as the needle was introduced, it was noticed that blood was  returning so this procedure was abandoned.   The scope was withdrawn at 2:31 p.m. 14 hours and 31 minutes.   COMPLICATIONS:  None noted.   SPECIAL NOTE:  Significant coughing slowing procedure requiring lot of  sedation and hampering the ability to get adequate tissues, especially  transbronchial needles in the pretracheal and the precarinal region   safely.   POSTPROCEDURE PLAN:  1. Get a chest x-ray.  2. Observe in the recovery area for an hour and a half after the last      sedation dose.  3. Follow biopsy report.  4. He will [facility]      Thursday of this coming week at 2 p.m. is his appointment.      Thursday will be February 15, 2008.  5. Await biopsy reports.  6. Postprocedure plan special note.  The pretracheal and the right      upper lobe transbronchial needle aspirates were mixed into the same      bottles so we would not know which site this would be from.      Kalman Shan, MD  Electronically Signed     MR/MEDQ  D:  02/12/2008  T:  02/12/2008  Job:  [id number]   cc:   Lajuana Matte, MD  Fax: [phone]   Lurline Hare, MD   Artist Pais. Kathrynn Running, M.D.  Fax: [phone]   Ines Bloomer, M.D.  [address]  [place]  [place] [address]   Claris Gladden, Thoracic Oncology Coordin

## 2010-06-17 ENCOUNTER — Ambulatory Visit (HOSPITAL_COMMUNITY)
Admission: RE | Admit: 2010-06-17 | Discharge: 2010-06-17 | Disposition: A | Discharge status: Home / Self Care | Payer: Self-pay | Source: Ambulatory Visit | Attending: Internal Medicine | Admitting: Internal Medicine

## 2010-06-17 ENCOUNTER — Other Ambulatory Visit: Payer: Self-pay | Admitting: Internal Medicine

## 2010-06-17 ENCOUNTER — Encounter (HOSPITAL_BASED_OUTPATIENT_CLINIC_OR_DEPARTMENT_OTHER): Payer: Medicare Other | Admitting: Internal Medicine

## 2010-06-17 DIAGNOSIS — C3410 Malignant neoplasm of upper lobe, unspecified bronchus or lung: Secondary | ICD-10-CM

## 2010-06-17 DIAGNOSIS — C3490 Malignant neoplasm of unspecified part of unspecified bronchus or lung: Secondary | ICD-10-CM | POA: Insufficient documentation

## 2010-06-17 LAB — CMP (CANCER CENTER ONLY)
Albumin: 3.4 g/dL (ref 3.3–5.5)
BUN, Bld: 7 mg/dL (ref 7–22)
CO2: 29 mEq/L (ref 18–33)
Calcium: 9.2 mg/dL (ref 8.0–10.3)
Chloride: 98 mEq/L (ref 98–108)
Glucose, Bld: 87 mg/dL (ref 73–118)
Potassium: 4.6 mEq/L (ref 3.3–4.7)
Sodium: 139 mEq/L (ref 128–145)
Total Protein: 7.3 g/dL (ref 6.4–8.1)

## 2010-06-17 LAB — CBC WITH DIFFERENTIAL/PLATELET
Basophils Absolute: 0 10*3/uL (ref 0.0–0.1)
Eosinophils Absolute: 0.2 10*3/uL (ref 0.0–0.5)
HGB: 15 g/dL (ref 13.0–17.1)
MCV: 91.5 fL (ref 79.3–98.0)
MONO#: 0.3 10*3/uL (ref 0.1–0.9)
NEUT#: 4.8 10*3/uL (ref 1.5–6.5)
RBC: 4.94 10*6/uL (ref 4.20–5.82)
RDW: 14.1 % (ref 11.0–14.6)
WBC: 6.8 10*3/uL (ref 4.0–10.3)

## 2010-06-17 MED ORDER — IOHEXOL 300 MG/ML  SOLN
80.0000 mL | Freq: Once | INTRAMUSCULAR | Status: AC | PRN
  Administered 2010-06-17: 80 mL via INTRAVENOUS

## 2010-06-24 ENCOUNTER — Encounter (HOSPITAL_BASED_OUTPATIENT_CLINIC_OR_DEPARTMENT_OTHER): Payer: Medicare Other | Admitting: Internal Medicine

## 2010-06-24 ENCOUNTER — Other Ambulatory Visit: Payer: Self-pay | Admitting: Internal Medicine

## 2010-06-24 DIAGNOSIS — C3410 Malignant neoplasm of upper lobe, unspecified bronchus or lung: Secondary | ICD-10-CM

## 2010-06-24 DIAGNOSIS — C3490 Malignant neoplasm of unspecified part of unspecified bronchus or lung: Secondary | ICD-10-CM

## 2010-06-24 DIAGNOSIS — I10 Essential (primary) hypertension: Secondary | ICD-10-CM

## 2010-11-06 LAB — BASIC METABOLIC PANEL
CO2: 26 mEq/L (ref 19–32)
Calcium: 9.4 mg/dL (ref 8.4–10.5)
Creatinine, Ser: 0.74 mg/dL (ref 0.4–1.5)
Glucose, Bld: 105 mg/dL — ABNORMAL HIGH (ref 70–99)

## 2010-11-06 LAB — CBC
MCHC: 31.6 g/dL (ref 30.0–36.0)
RDW: 14.5 % (ref 11.5–15.5)

## 2010-11-06 LAB — POCT CARDIAC MARKERS: Myoglobin, poc: 111 ng/mL (ref 12–200)

## 2010-12-15 ENCOUNTER — Telehealth: Payer: Self-pay | Admitting: Internal Medicine

## 2010-12-15 NOTE — Telephone Encounter (Signed)
S/w pt, advised him 11/20 md appt has been cancelled due to Epic. Advised pt to keep all other scheduled appts and we will call back later to r/s md appt.

## 2010-12-17 ENCOUNTER — Ambulatory Visit (HOSPITAL_COMMUNITY)
Admission: RE | Admit: 2010-12-17 | Discharge: 2010-12-17 | Disposition: A | Discharge status: Home / Self Care | Payer: Medicare Other | Source: Ambulatory Visit | Attending: Internal Medicine | Admitting: Internal Medicine

## 2010-12-17 ENCOUNTER — Other Ambulatory Visit (HOSPITAL_BASED_OUTPATIENT_CLINIC_OR_DEPARTMENT_OTHER): Payer: Medicare Other | Admitting: Lab

## 2010-12-17 ENCOUNTER — Other Ambulatory Visit: Payer: Self-pay | Admitting: Internal Medicine

## 2010-12-17 ENCOUNTER — Encounter (HOSPITAL_COMMUNITY): Payer: Self-pay

## 2010-12-17 DIAGNOSIS — Z85118 Personal history of other malignant neoplasm of bronchus and lung: Secondary | ICD-10-CM | POA: Insufficient documentation

## 2010-12-17 DIAGNOSIS — C3410 Malignant neoplasm of upper lobe, unspecified bronchus or lung: Secondary | ICD-10-CM

## 2010-12-17 DIAGNOSIS — C3490 Malignant neoplasm of unspecified part of unspecified bronchus or lung: Secondary | ICD-10-CM

## 2010-12-17 DIAGNOSIS — J984 Other disorders of lung: Secondary | ICD-10-CM | POA: Insufficient documentation

## 2010-12-17 LAB — CBC WITH DIFFERENTIAL/PLATELET
Eosinophils Absolute: 0.3 10*3/uL (ref 0.0–0.5)
HCT: 47.9 % (ref 38.4–49.9)
LYMPH%: 20.3 % (ref 14.0–49.0)
MONO#: 0.4 10*3/uL (ref 0.1–0.9)
NEUT#: 3.4 10*3/uL (ref 1.5–6.5)
NEUT%: 66.1 % (ref 39.0–75.0)
Platelets: 319 10*3/uL (ref 140–400)
RBC: 5.24 10*6/uL (ref 4.20–5.82)
WBC: 5.1 10*3/uL (ref 4.0–10.3)

## 2010-12-17 LAB — CMP (CANCER CENTER ONLY)
Albumin: 3.5 g/dL (ref 3.3–5.5)
CO2: 32 mEq/L (ref 18–33)
Calcium: 9.2 mg/dL (ref 8.0–10.3)
Chloride: 96 mEq/L — ABNORMAL LOW (ref 98–108)
Glucose, Bld: 94 mg/dL (ref 73–118)
Sodium: 143 mEq/L (ref 128–145)
Total Bilirubin: 0.6 mg/dl (ref 0.20–1.60)
Total Protein: 7.7 g/dL (ref 6.4–8.1)

## 2010-12-17 MED ORDER — IOHEXOL 300 MG/ML  SOLN
80.0000 mL | Freq: Once | INTRAMUSCULAR | Status: AC | PRN
  Administered 2010-12-17: 80 mL via INTRAVENOUS

## 2011-01-06 ENCOUNTER — Telehealth: Payer: Self-pay | Admitting: Internal Medicine

## 2011-01-06 NOTE — Telephone Encounter (Signed)
S/w pt, gave appt 03/10/11 @ 3pm r/s'd from 11/20 appt cx'd due to Epic.

## 2011-02-08 DIAGNOSIS — F411 Generalized anxiety disorder: Secondary | ICD-10-CM | POA: Diagnosis not present

## 2011-03-10 ENCOUNTER — Ambulatory Visit: Payer: Medicare Other | Admitting: Internal Medicine

## 2011-03-17 ENCOUNTER — Telehealth: Payer: Self-pay | Admitting: Internal Medicine

## 2011-03-17 NOTE — Telephone Encounter (Signed)
called pt as he left a message to r/s  his 2/6 appt,r/s  to 2/26

## 2011-03-30 ENCOUNTER — Ambulatory Visit: Payer: Medicare Other | Admitting: Internal Medicine

## 2011-03-31 ENCOUNTER — Telehealth: Payer: Self-pay | Admitting: Internal Medicine

## 2011-03-31 NOTE — Telephone Encounter (Signed)
pt called and lm to r/s 2/26 appt,called pt back and lm to call back to r/s  aom

## 2011-04-15 ENCOUNTER — Telehealth: Payer: Self-pay | Admitting: Internal Medicine

## 2011-04-15 NOTE — Telephone Encounter (Signed)
pt called to r/s misses appts and new appt is 3/19     aom

## 2011-04-20 ENCOUNTER — Ambulatory Visit (HOSPITAL_BASED_OUTPATIENT_CLINIC_OR_DEPARTMENT_OTHER): Payer: Medicare Other | Admitting: Internal Medicine

## 2011-04-20 ENCOUNTER — Telehealth: Payer: Self-pay | Admitting: Oncology

## 2011-04-20 VITALS — BP 128/77 | HR 84 | Temp 97.5°F | Ht 65.0 in | Wt 143.8 lb

## 2011-04-20 DIAGNOSIS — C3410 Malignant neoplasm of upper lobe, unspecified bronchus or lung: Secondary | ICD-10-CM

## 2011-04-20 NOTE — Progress Notes (Signed)
[facility] [phone]   [phone]  OFFICE PROGRESS NOTE  PRINCIPAL DIAGNOSIS:  Stage IIIA (T2 N2 Mx) non-small cell lung cancer, adenocarcinoma, diagnosed in June 2010.  PRIOR THERAPY:   1. Status post concurrent chemoradiation with weekly carboplatin and paclitaxel; last dose was given April 08, 2008. 2. Status post 3 cycles of consolidation chemotherapy with docetaxel; last dose was given on Jun 26, 2008.  CURRENT THERAPY:  Observation.  INTERVAL HISTORY: Evan [name] 61 y.o. male returns to the clinic today for followup visit. The patient missed his appointment in November 2012. He is feeling fine today with no specific complaints. He denied having any significant weight loss or night sweats. He gained almost 9 pounds since his last visit in May of 2012. He denied having any significant chest pain or shortness of breath, no cough or hemoptysis. He has repeat CT scan of the chest performed in November of 2012 that showed no evidence for disease recurrence.  MEDICAL HISTORY: Past Medical History  Diagnosis Date  . Cancer     lung ca    ALLERGIES:   has no known allergies.  MEDICATIONS:  Current Outpatient Prescriptions  Medication Sig Dispense Refill  . citalopram (CELEXA) 20 MG tablet Take 20 mg by mouth daily.      . hydrOXYzine (VISTARIL) 25 MG capsule Take 25 mg by mouth as needed.        REVIEW OF SYSTEMS:  A comprehensive review of systems was negative.   PHYSICAL EXAMINATION: General appearance: alert, cooperative and no distress Neck: no adenopathy Resp: clear to auscultation bilaterally Cardio: regular rate and rhythm, S1, S2 normal, no murmur, click, rub or gallop GI: soft, non-tender; bowel sounds normal; no masses,  no organomegaly Extremities: extremities normal, atraumatic, no cyanosis or edema  ECOG PERFORMANCE STATUS: 0 - Asymptomatic  Blood pressure 128/77, pulse 84, temperature 97.5 F (36.4 C), temperature  source Oral, height 5\' 5"  (1.651 m), weight 143 lb 12.8 oz (65.227 kg).  LABORATORY DATA: Lab Results  Component Value Date   WBC 5.1 12/17/2010   HGB 15.7 12/17/2010   HCT 47.9 12/17/2010   MCV 91.3 12/17/2010   PLT 319 12/17/2010      Chemistry      Component Value Date/Time   NA 143 12/17/2010 1217   NA 140 10/22/2009 1317   K 4.5 12/17/2010 1217   K 4.3 10/22/2009 1317   CL 96* 12/17/2010 1217   CL 106 10/22/2009 1317   CO2 32 12/17/2010 1217   CO2 27 10/22/2009 1317   BUN 11 12/17/2010 1217   BUN 12 10/22/2009 1317   CREATININE 0.9 12/17/2010 1217   CREATININE 0.94 10/22/2009 1317      Component Value Date/Time   CALCIUM 9.2 12/17/2010 1217   CALCIUM 9.4 10/22/2009 1317   ALKPHOS 89* 12/17/2010 1217   ALKPHOS 88 10/22/2009 1317   AST 19 12/17/2010 1217   AST 17 10/22/2009 1317   ALT 17 10/22/2009 1317   BILITOT 0.60 12/17/2010 1217   BILITOT 0.4 10/22/2009 1317       RADIOGRAPHIC STUDIES: No results found.  ASSESSMENT: This is a very pleasant 61 years old [place] American male with history of stage IIIa non-small cell lung cancer status post course of concurrent chemoradiation followed by consolidation chemotherapy and the patient has been observation since May 2010. He has no evidence of disease progression on his recent scan.  PLAN: I recommended for him continuous observation  for now with repeat CT scan of the chest with contrast in 3 months. He would come back for followup visit at that time. He was advised to call me immediately if he has any concerning symptoms in the interval.  All questions were answered. The patient knows to call the clinic with any problems, questions or concerns. We can certainly see the patient much sooner if necessary.

## 2011-04-20 NOTE — Telephone Encounter (Signed)
appts made and printed for pt aom °

## 2011-07-14 DIAGNOSIS — F331 Major depressive disorder, recurrent, moderate: Secondary | ICD-10-CM | POA: Diagnosis not present

## 2011-07-20 ENCOUNTER — Other Ambulatory Visit (HOSPITAL_BASED_OUTPATIENT_CLINIC_OR_DEPARTMENT_OTHER): Payer: Medicare Other

## 2011-07-20 ENCOUNTER — Ambulatory Visit (HOSPITAL_COMMUNITY)
Admission: RE | Admit: 2011-07-20 | Discharge: 2011-07-20 | Disposition: A | Discharge status: Home / Self Care | Payer: Medicare Other | Source: Ambulatory Visit | Attending: Internal Medicine | Admitting: Internal Medicine

## 2011-07-20 DIAGNOSIS — C3410 Malignant neoplasm of upper lobe, unspecified bronchus or lung: Secondary | ICD-10-CM

## 2011-07-20 DIAGNOSIS — C3490 Malignant neoplasm of unspecified part of unspecified bronchus or lung: Secondary | ICD-10-CM

## 2011-07-20 DIAGNOSIS — R918 Other nonspecific abnormal finding of lung field: Secondary | ICD-10-CM | POA: Diagnosis not present

## 2011-07-20 LAB — CBC WITH DIFFERENTIAL/PLATELET
BASO%: 1.2 % (ref 0.0–2.0)
Eosinophils Absolute: 0.3 10*3/uL (ref 0.0–0.5)
MONO#: 0.3 10*3/uL (ref 0.1–0.9)
NEUT#: 2.9 10*3/uL (ref 1.5–6.5)
Platelets: 334 10*3/uL (ref 140–400)
RBC: 4.98 10*6/uL (ref 4.20–5.82)
RDW: 14.3 % (ref 11.0–14.6)
WBC: 4.6 10*3/uL (ref 4.0–10.3)
lymph#: 1 10*3/uL (ref 0.9–3.3)

## 2011-07-20 LAB — CMP (CANCER CENTER ONLY)
ALT(SGPT): 27 U/L (ref 10–47)
Albumin: 3.5 g/dL (ref 3.3–5.5)
CO2: 28 mEq/L (ref 18–33)
Chloride: 102 mEq/L (ref 98–108)
Glucose, Bld: 111 mg/dL (ref 73–118)
Potassium: 4.8 mEq/L — ABNORMAL HIGH (ref 3.3–4.7)
Sodium: 139 mEq/L (ref 128–145)
Total Protein: 7.7 g/dL (ref 6.4–8.1)

## 2011-07-20 MED ORDER — IOHEXOL 300 MG/ML  SOLN
100.0000 mL | Freq: Once | INTRAMUSCULAR | Status: AC | PRN
  Administered 2011-07-20: 80 mL via INTRAVENOUS

## 2011-07-21 ENCOUNTER — Telehealth: Payer: Self-pay | Admitting: Internal Medicine

## 2011-07-21 ENCOUNTER — Ambulatory Visit (HOSPITAL_BASED_OUTPATIENT_CLINIC_OR_DEPARTMENT_OTHER): Payer: Medicare Other | Admitting: Internal Medicine

## 2011-07-21 DIAGNOSIS — C3410 Malignant neoplasm of upper lobe, unspecified bronchus or lung: Secondary | ICD-10-CM

## 2011-07-21 NOTE — Progress Notes (Signed)
[facility] [phone]   Fax:([phone]  OFFICE PROGRESS NOTE  PRINCIPAL DIAGNOSIS: Stage IIIA (T2 N2 Mx) non-small cell lung cancer, adenocarcinoma, diagnosed in June 2010.   PRIOR THERAPY:  1. Status post concurrent chemoradiation with weekly carboplatin and paclitaxel; last dose was given April 08, 2008. 2. Status post 3 cycles of consolidation chemotherapy with docetaxel; last dose was given on Jun 26, 2008.  CURRENT THERAPY: Observation.  INTERVAL HISTORY: Evan [name] 62 y.o. male returns to the clinic today for routine six-month followup visit. The patient is doing fine today with no specific complaints except for occasional dizzy spells secondary to dehydration. It improves after he drinks some fluids. He denied having any significant chest pain or shortness of breath, no cough or hemoptysis. He has no weight loss or night sweats. No significant headache or blurry vision. He has repeat CT scan of the chest performed recently and he is here today for evaluation and discussion of his scan results.  MEDICAL HISTORY: Past Medical History  Diagnosis Date  . Cancer     lung ca    ALLERGIES:   has no known allergies.  MEDICATIONS:  Current Outpatient Prescriptions  Medication Sig Dispense Refill  . citalopram (CELEXA) 20 MG tablet Take 20 mg by mouth daily.      . hydrOXYzine (VISTARIL) 25 MG capsule Take 25 mg by mouth as needed.       No current facility-administered medications for this visit.   Facility-Administered Medications Ordered in Other Visits  Medication Dose Route Frequency Provider Last Rate Last Dose  . iohexol (OMNIPAQUE) 300 MG/ML solution 100 mL  100 mL Intravenous Once PRN Medication Radiologist, MD   80 mL at 07/20/11 1410    REVIEW OF SYSTEMS:  A comprehensive review of systems was negative except for: Constitutional: positive for Occasional dizzy spells   PHYSICAL EXAMINATION: General appearance: alert,  cooperative and no distress Head: Normocephalic, without obvious abnormality, atraumatic Neck: no adenopathy Lymph nodes: Cervical, supraclavicular, and axillary nodes normal. Resp: clear to auscultation bilaterally Cardio: regular rate and rhythm, S1, S2 normal, no murmur, click, rub or gallop GI: soft, non-tender; bowel sounds normal; no masses,  no organomegaly Extremities: extremities normal, atraumatic, no cyanosis or edema Neurologic: Alert and oriented X 3, normal strength and tone. Normal symmetric reflexes. Normal coordination and gait  ECOG PERFORMANCE STATUS: 0 - Asymptomatic  There were no vitals taken for this visit.  LABORATORY DATA: Lab Results  Component Value Date   WBC 4.6 07/20/2011   HGB 14.7 07/20/2011   HCT 44.9 07/20/2011   MCV 90.1 07/20/2011   PLT 334 07/20/2011      Chemistry      Component Value Date/Time   NA 139 07/20/2011 1312   NA 140 10/22/2009 1317   K 4.8* 07/20/2011 1312   K 4.3 10/22/2009 1317   CL 102 07/20/2011 1312   CL 106 10/22/2009 1317   CO2 28 07/20/2011 1312   CO2 27 10/22/2009 1317   BUN 9 07/20/2011 1312   BUN 12 10/22/2009 1317   CREATININE 1.3* 07/20/2011 1312   CREATININE 0.94 10/22/2009 1317      Component Value Date/Time   CALCIUM 8.9 07/20/2011 1312   CALCIUM 9.4 10/22/2009 1317   ALKPHOS 88* 07/20/2011 1312   ALKPHOS 88 10/22/2009 1317   AST 21 07/20/2011 1312   AST 17 10/22/2009 1317   ALT 17 10/22/2009 1317   BILITOT 0.60 07/20/2011 1312  BILITOT 0.4 10/22/2009 1317       RADIOGRAPHIC STUDIES: Ct Chest W Contrast  07/20/2011  *RADIOLOGY REPORT*  Clinical Data: Lung cancer.  CT CHEST WITH CONTRAST  Technique:  Multidetector CT imaging of the chest was performed following the standard protocol during bolus administration of intravenous contrast.  Contrast: 80mL OMNIPAQUE IOHEXOL 300 MG/ML  SOLN  Comparison: Chest CT 12/17/2010.  Findings: The chest wall is unremarkable and stable.  No supraclavicular or axillary lymphadenopathy.   The thyroid gland appears normal.  The bony thorax is intact.  No destructive bone lesions or spinal canal compromise.  The heart is normal in size.  No pericardial effusion.  No mediastinal or hilar lymphadenopathy.  The esophagus is grossly normal.  The aorta is normal in caliber.  No dissection.  Examination of the lung parenchyma demonstrates changes of radiation fibrosis involving the right paramediastinal lung.  Mild / early emphysematous changes are noted.  No CT findings for recurrent tumor or metastatic pulmonary disease.  No acute pulmonary findings.  The upper abdomen is unremarkable and stable.  No adrenal gland or liver lesions.  IMPRESSION: Stable CT appearance of the chest.  No findings to suggest recurrent tumor or metastatic pulmonary disease.  Original Report Authenticated By: P. Loralie Champagne, M.D.    ASSESSMENT: This is a very pleasant 61 years old African American male with history of stage IIIa non-small cell lung cancer status post concurrent chemoradiation followed by consolidation chemotherapy. He has been observation since May of 2010 with no evidence for disease recurrence.  PLAN: I discussed the scan results with the patient. I recommended for him continuous observation for now with repeat CT scan of the chest in 6 months. He was advised to call me immediately if he has any concerning symptoms in the interval. He was also advised to call the office if he continues to have frequent episodes of the dizziness as I may consider the patient for brain imaging to rule out brain metastasis.  All questions were answered. The patient knows to call the clinic with any problems, questions or concerns. We can certainly see the patient much sooner if necessary.

## 2011-07-21 NOTE — Telephone Encounter (Signed)
Gv pt appt for dec2013.  scheduled ct scan for 12/17@ [facility]

## 2011-08-23 DIAGNOSIS — F331 Major depressive disorder, recurrent, moderate: Secondary | ICD-10-CM | POA: Diagnosis not present

## 2011-08-30 DIAGNOSIS — F331 Major depressive disorder, recurrent, moderate: Secondary | ICD-10-CM | POA: Diagnosis not present

## 2011-10-05 DIAGNOSIS — F331 Major depressive disorder, recurrent, moderate: Secondary | ICD-10-CM | POA: Diagnosis not present

## 2011-10-18 DIAGNOSIS — F331 Major depressive disorder, recurrent, moderate: Secondary | ICD-10-CM | POA: Diagnosis not present

## 2011-11-22 DIAGNOSIS — F331 Major depressive disorder, recurrent, moderate: Secondary | ICD-10-CM | POA: Diagnosis not present

## 2012-01-06 DIAGNOSIS — F331 Major depressive disorder, recurrent, moderate: Secondary | ICD-10-CM | POA: Diagnosis not present

## 2012-01-18 ENCOUNTER — Ambulatory Visit (HOSPITAL_COMMUNITY)
Admission: RE | Admit: 2012-01-18 | Discharge: 2012-01-18 | Disposition: A | Discharge status: Home / Self Care | Payer: Medicare Other | Source: Ambulatory Visit | Attending: Internal Medicine | Admitting: Internal Medicine

## 2012-01-18 ENCOUNTER — Other Ambulatory Visit (HOSPITAL_BASED_OUTPATIENT_CLINIC_OR_DEPARTMENT_OTHER): Payer: Medicare Other | Admitting: Lab

## 2012-01-18 DIAGNOSIS — R0602 Shortness of breath: Secondary | ICD-10-CM | POA: Insufficient documentation

## 2012-01-18 DIAGNOSIS — C3410 Malignant neoplasm of upper lobe, unspecified bronchus or lung: Secondary | ICD-10-CM | POA: Diagnosis not present

## 2012-01-18 DIAGNOSIS — R05 Cough: Secondary | ICD-10-CM | POA: Insufficient documentation

## 2012-01-18 DIAGNOSIS — R059 Cough, unspecified: Secondary | ICD-10-CM | POA: Diagnosis not present

## 2012-01-18 DIAGNOSIS — J438 Other emphysema: Secondary | ICD-10-CM | POA: Diagnosis not present

## 2012-01-18 DIAGNOSIS — C3490 Malignant neoplasm of unspecified part of unspecified bronchus or lung: Secondary | ICD-10-CM | POA: Diagnosis not present

## 2012-01-18 LAB — CBC WITH DIFFERENTIAL/PLATELET
Basophils Absolute: 0 10*3/uL (ref 0.0–0.1)
EOS%: 6.2 % (ref 0.0–7.0)
Eosinophils Absolute: 0.3 10*3/uL (ref 0.0–0.5)
HGB: 14.9 g/dL (ref 13.0–17.1)
LYMPH%: 23.3 % (ref 14.0–49.0)
MCH: 29.1 pg (ref 27.2–33.4)
MCV: 87.3 fL (ref 79.3–98.0)
MONO%: 9.1 % (ref 0.0–14.0)
Platelets: 328 10*3/uL (ref 140–400)
RBC: 5.12 10*6/uL (ref 4.20–5.82)
RDW: 13.7 % (ref 11.0–14.6)

## 2012-01-18 LAB — COMPREHENSIVE METABOLIC PANEL (CC13)
AST: 22 U/L (ref 5–34)
Albumin: 3.4 g/dL — ABNORMAL LOW (ref 3.5–5.0)
Alkaline Phosphatase: 104 U/L (ref 40–150)
Potassium: 3.9 mEq/L (ref 3.5–5.1)
Sodium: 138 mEq/L (ref 136–145)
Total Bilirubin: 0.24 mg/dL (ref 0.20–1.20)
Total Protein: 7.3 g/dL (ref 6.4–8.3)

## 2012-01-18 MED ORDER — IOHEXOL 300 MG/ML  SOLN
80.0000 mL | Freq: Once | INTRAMUSCULAR | Status: AC | PRN
  Administered 2012-01-18: 80 mL via INTRAVENOUS

## 2012-01-20 ENCOUNTER — Telehealth: Payer: Self-pay | Admitting: Internal Medicine

## 2012-01-20 ENCOUNTER — Encounter: Payer: Self-pay | Admitting: Internal Medicine

## 2012-01-20 ENCOUNTER — Ambulatory Visit (HOSPITAL_BASED_OUTPATIENT_CLINIC_OR_DEPARTMENT_OTHER): Payer: Medicare Other | Admitting: Internal Medicine

## 2012-01-20 VITALS — BP 136/88 | HR 84 | Temp 97.8°F | Resp 20 | Ht 65.0 in | Wt 144.3 lb

## 2012-01-20 DIAGNOSIS — C3410 Malignant neoplasm of upper lobe, unspecified bronchus or lung: Secondary | ICD-10-CM

## 2012-01-20 NOTE — Progress Notes (Signed)
[facility] [phone]   [phone]  OFFICE PROGRESS NOTE  PRINCIPAL DIAGNOSIS: Stage IIIA (T2 N2 Mx) non-small cell lung cancer, adenocarcinoma, diagnosed in June 2010.   PRIOR THERAPY:  1. Status post concurrent chemoradiation with weekly carboplatin and paclitaxel; last dose was given April 08, 2008. 2. Status post 3 cycles of consolidation chemotherapy with docetaxel; last dose was given on Jun 26, 2008.  CURRENT THERAPY: Observation.  INTERVAL HISTORY: Evan [name] 61 y.o. male returns to the clinic today for routine six-month followup visit. The patient is feeling fine today with no specific complaints. He denied having any significant chest pain, shortness breath, cough or hemoptysis. He denied having any weight loss or night sweats. The patient had repeat CT scan of the chest performed recently and he is here for evaluation and discussion of his scan results.  MEDICAL HISTORY: Past Medical History  Diagnosis Date  . Cancer     lung ca    ALLERGIES:   has no known allergies.  MEDICATIONS:  Current Outpatient Prescriptions  Medication Sig Dispense Refill  . citalopram (CELEXA) 20 MG tablet Take 20 mg by mouth daily.      . hydrOXYzine (VISTARIL) 25 MG capsule Take 25 mg by mouth as needed.        REVIEW OF SYSTEMS:  A comprehensive review of systems was negative.   PHYSICAL EXAMINATION: General appearance: alert, cooperative and no distress Head: Normocephalic, without obvious abnormality, atraumatic Neck: no adenopathy Resp: clear to auscultation bilaterally Cardio: regular rate and rhythm, S1, S2 normal, no murmur, click, rub or gallop GI: soft, non-tender; bowel sounds normal; no masses,  no organomegaly Extremities: extremities normal, atraumatic, no cyanosis or edema  ECOG PERFORMANCE STATUS: 1 - Symptomatic but completely ambulatory  There were no vitals taken for this visit.  LABORATORY DATA: Lab Results    Component Value Date   WBC 5.3 01/18/2012   HGB 14.9 01/18/2012   HCT 44.7 01/18/2012   MCV 87.3 01/18/2012   PLT 328 01/18/2012      Chemistry      Component Value Date/Time   NA 138 01/18/2012 1110   NA 139 07/20/2011 1312   NA 140 10/22/2009 1317   K 3.9 01/18/2012 1110   K 4.8* 07/20/2011 1312   K 4.3 10/22/2009 1317   CL 105 01/18/2012 1110   CL 102 07/20/2011 1312   CL 106 10/22/2009 1317   CO2 26 01/18/2012 1110   CO2 28 07/20/2011 1312   CO2 27 10/22/2009 1317   BUN 9.0 01/18/2012 1110   BUN 9 07/20/2011 1312   BUN 12 10/22/2009 1317   CREATININE 0.9 01/18/2012 1110   CREATININE 1.3* 07/20/2011 1312   CREATININE 0.94 10/22/2009 1317      Component Value Date/Time   CALCIUM 9.3 01/18/2012 1110   CALCIUM 8.9 07/20/2011 1312   CALCIUM 9.4 10/22/2009 1317   ALKPHOS 104 01/18/2012 1110   ALKPHOS 88* 07/20/2011 1312   ALKPHOS 88 10/22/2009 1317   AST 22 01/18/2012 1110   AST 21 07/20/2011 1312   AST 17 10/22/2009 1317   ALT 29 01/18/2012 1110   ALT 17 10/22/2009 1317   BILITOT 0.24 01/18/2012 1110   BILITOT 0.60 07/20/2011 1312   BILITOT 0.4 10/22/2009 1317       RADIOGRAPHIC STUDIES: Ct Chest W Contrast  01/18/2012  *RADIOLOGY REPORT*  Clinical Data: Follow-up lung cancer status post chemotherapy and radiation therapy completed in 2010.  Cough and shortness of breath.  CT CHEST WITH CONTRAST  Technique:  Multidetector CT imaging of the chest was performed following the standard protocol during bolus administration of intravenous contrast.  Contrast: 80mL OMNIPAQUE IOHEXOL 300 MG/ML  SOLN  Comparison: Prior CT 07/20/2011 and 12/17/2010.  Findings: There are no enlarged mediastinal or hilar lymph nodes. There is no pleural or pericardial effusion.  Paramediastinal radiation changes in the upper right hemithorax are stable.  There is no suspicious pulmonary nodule or endobronchial lesion.  Mild emphysematous changes are stable.  A low density subcutaneous lesion in the upper right  back on image 17 is unchanged, likely a sebaceous cyst.  The visualized upper abdomen appears unremarkable.  There are no suspicious osseous findings.  IMPRESSION: Stable examination with post radiation paramediastinal changes on the right.  No evidence of local recurrence or metastatic disease.   Original Report Authenticated By: Carey Bullocks, M.D.     ASSESSMENT: This is a very pleasant 61 years old African American male with history of stage IIIa non-small cell lung cancer status post concurrent chemoradiation followed by consolidation chemotherapy and has been observation since May of 2010 with no evidence for disease progression.  PLAN: I discussed the scan results with the patient today. I recommended for him to continue on observation with repeat CT scan of the chest in 6 months.  The patient was advised to call me immediately if he has any concerning symptoms in the interval.  All questions were answered. The patient knows to call the clinic with any problems, questions or concerns. We can certainly see the patient much sooner if necessary.

## 2012-01-20 NOTE — Telephone Encounter (Signed)
appts made and printed for pt aom °

## 2012-01-22 ENCOUNTER — Encounter: Payer: Self-pay | Admitting: Internal Medicine

## 2012-01-22 NOTE — Patient Instructions (Signed)
No evidence for disease progression on his recent scan.  Followup in 6 months with repeat CT scan of the chest dated

## 2012-02-28 DIAGNOSIS — F331 Major depressive disorder, recurrent, moderate: Secondary | ICD-10-CM | POA: Diagnosis not present

## 2012-03-20 ENCOUNTER — Ambulatory Visit: Payer: Medicare Other

## 2012-03-20 ENCOUNTER — Ambulatory Visit (INDEPENDENT_AMBULATORY_CARE_PROVIDER_SITE_OTHER): Payer: Medicare Other | Admitting: Family Medicine

## 2012-03-20 VITALS — BP 125/86 | HR 87 | Temp 98.1°F | Resp 18 | Ht 60.5 in | Wt 150.0 lb

## 2012-03-20 DIAGNOSIS — S82899A Other fracture of unspecified lower leg, initial encounter for closed fracture: Secondary | ICD-10-CM | POA: Diagnosis not present

## 2012-03-20 DIAGNOSIS — S8263XA Displaced fracture of lateral malleolus of unspecified fibula, initial encounter for closed fracture: Secondary | ICD-10-CM | POA: Diagnosis not present

## 2012-03-20 DIAGNOSIS — M25569 Pain in unspecified knee: Secondary | ICD-10-CM

## 2012-03-20 DIAGNOSIS — M25539 Pain in unspecified wrist: Secondary | ICD-10-CM

## 2012-03-20 DIAGNOSIS — S82891A Other fracture of right lower leg, initial encounter for closed fracture: Secondary | ICD-10-CM

## 2012-03-20 DIAGNOSIS — M25579 Pain in unspecified ankle and joints of unspecified foot: Secondary | ICD-10-CM

## 2012-03-20 MED ORDER — HYDROCODONE-ACETAMINOPHEN 5-500 MG PO TABS: 1.0000 | ORAL_TABLET | Freq: Three times a day (TID) | ORAL | Status: DC | PRN

## 2012-03-20 NOTE — Patient Instructions (Addendum)
You will see ortho today for your broken ankle.  Use the pain medication as needed but do not combine it with your vistaril.    You will be seen at [facility] orthopedics, with Dr. Rayburn Ma- head over there now   Address: [address] [place], [place] [address]

## 2012-03-20 NOTE — Progress Notes (Addendum)
[facility] [address], [place] [address] [phone]  Date:  03/20/2012   Name:  Evan [name]   DOB:  [birth year]   MRN:  [id number]  PCP:  [name], MD    Chief Complaint: Foot Pain   History of Present Illness:  Evan [name] is a 62 y.o. very pleasant male patient who presents with the following:  He is here with a right ankle injury that occurred 3 days ago- he slipped and fell on the ice.  He had pain right away, and cannot walk on the ankle.  He has been crawling around the house- his daughter brought him in today for evaluation  He has a history of lung cancer- he is being observed at this time  Patient Active Problem List  Diagnosis  . 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 HYPERLIPIDEMIA  . TOBACCO ABUSE  . DEPRESSION  . HYPERTENSION  . COPD  . HEADACHE    Past Medical History  Diagnosis Date  . Cancer     lung ca    History reviewed. No pertinent past surgical history.  History  Substance Use Topics  . Smoking status: Former Games developer  . Smokeless tobacco: Not on file  . Alcohol Use: Not on file    History reviewed. No pertinent family history.  No Known Allergies  Medication list has been reviewed and updated.  Current Outpatient Prescriptions on File Prior to Visit  Medication Sig Dispense Refill  . citalopram (CELEXA) 20 MG tablet Take 20 mg by mouth daily.      . hydrOXYzine (VISTARIL) 25 MG capsule Take 25 mg by mouth as needed.       No current facility-administered medications on file prior to visit.    Review of Systems:  As per HPI- otherwise negative.   Physical Examination: Filed Vitals:   03/20/12 1150  BP: 125/86  Pulse: 87  Temp: 98.1 F (36.7 C)  Resp: 18   Filed Vitals:   03/20/12 1150  Height: 5' 0.5" (1.537 m)  Weight: 150 lb (68.04 kg)   Body mass index is 28.8 kg/(m^2). Ideal Body Weight: Weight in (lb) to have BMI = 25: 129.9  GEN: WDWN, NAD, Non-toxic, A &  O x 3, sitting in WC HEENT: Atraumatic, Normocephalic. Neck supple. No masses, No LAD. Ears and Nose: No external deformity. CV: RRR, No M/G/R. No JVD. No thrill. No extra heart sounds. PULM: CTA B, no wheezes, crackles, rhonchi. No retractions. No resp. distress. No accessory muscle use. EXTR: No c/c/e NEURO in wheel chair PSYCH: Normally interactive. Conversant. Not depressed or anxious appearing.  Calm demeanor.  Right LE: slight tenderness over proximal tibia, tenderenss and swelling over lateral malleolus.  Swelling over dorsal foot but no tenderness.  Achilles intact, ankle joint stable, normal perfusion of foot  [facility] reading (PRIMARY) by  Dr. Patsy Lager. Right tib/ fib: distal fibula fracture, proximally ok Right ankle: distal fibula fracture, comminuted  Right foot:  negative   RIGHT ANKLE - COMPLETE 3+ VIEW  Comparison: None.  Findings: Three views of the right ankle submitted. There is  oblique nondisplaced fracture in distal right fibula. Ankle  mortise is preserved.  IMPRESSION:  Oblique nondisplaced fracture in distal right fibula.  RIGHT FOOT COMPLETE - 3+ VIEW  Comparison: None.  Findings: Three views of the right foot submitted. No acute fracture or subluxation. No radiopaque foreign body.  IMPRESSION: No acute fracture or subluxation.  *RADIOLOGY REPORT*  Clinical Data: Pain post fall  RIGHT TIBIA AND FIBULA - 2 VIEW  Comparison: None.  Findings: Two views of the right tibia-fibula submitted. There is oblique nondisplaced fracture in distal right fibula.  IMPRESSION: Oblique nondisplaced fracture in distal right fibula.   Assessment and Plan: Pain in joint, ankle and foot - Plan: DG Ankle Complete Right, DG Foot Complete Right  Pain in joint, lower leg - Plan: DG Tibia/Fibula Right  Ankle fracture, right - Plan: HYDROcodone-acetaminophen (VICODIN) 5-500 MG per tablet  Placed in posterior splint.  We do not have crutches to fit him at this time.   See patient instructions for more details.   Sent to ortho now for further evaluation, NWB  COPLAND,JESSICA, MD

## 2012-04-05 DIAGNOSIS — S8263XA Displaced fracture of lateral malleolus of unspecified fibula, initial encounter for closed fracture: Secondary | ICD-10-CM | POA: Diagnosis not present

## 2012-04-19 DIAGNOSIS — S8263XA Displaced fracture of lateral malleolus of unspecified fibula, initial encounter for closed fracture: Secondary | ICD-10-CM | POA: Diagnosis not present

## 2012-05-11 DIAGNOSIS — S8263XA Displaced fracture of lateral malleolus of unspecified fibula, initial encounter for closed fracture: Secondary | ICD-10-CM | POA: Diagnosis not present

## 2012-05-21 ENCOUNTER — Encounter (HOSPITAL_COMMUNITY): Payer: Self-pay

## 2012-05-21 ENCOUNTER — Encounter (HOSPITAL_COMMUNITY)
Admission: EM | Disposition: A | Discharge status: Skilled Nursing Facility | Payer: Self-pay | Source: Home / Self Care | Attending: Orthopedic Surgery

## 2012-05-21 ENCOUNTER — Other Ambulatory Visit: Payer: Self-pay

## 2012-05-21 ENCOUNTER — Inpatient Hospital Stay (HOSPITAL_COMMUNITY)
Admission: EM | Admit: 2012-05-21 | Discharge: 2012-05-24 | DRG: 908 | Disposition: A | Discharge status: Skilled Nursing Facility | Payer: Medicare Other | Attending: Orthopedic Surgery | Admitting: Orthopedic Surgery

## 2012-05-21 ENCOUNTER — Encounter (HOSPITAL_COMMUNITY): Payer: Self-pay | Admitting: Anesthesiology

## 2012-05-21 ENCOUNTER — Emergency Department (HOSPITAL_COMMUNITY): Payer: Medicare Other

## 2012-05-21 ENCOUNTER — Emergency Department (HOSPITAL_COMMUNITY): Payer: Medicare Other | Admitting: Anesthesiology

## 2012-05-21 DIAGNOSIS — Z859 Personal history of malignant neoplasm, unspecified: Secondary | ICD-10-CM

## 2012-05-21 DIAGNOSIS — S92309B Fracture of unspecified metatarsal bone(s), unspecified foot, initial encounter for open fracture: Secondary | ICD-10-CM | POA: Diagnosis not present

## 2012-05-21 DIAGNOSIS — R262 Difficulty in walking, not elsewhere classified: Secondary | ICD-10-CM | POA: Diagnosis not present

## 2012-05-21 DIAGNOSIS — S52599A Other fractures of lower end of unspecified radius, initial encounter for closed fracture: Secondary | ICD-10-CM | POA: Diagnosis present

## 2012-05-21 DIAGNOSIS — S98919A Complete traumatic amputation of unspecified foot, level unspecified, initial encounter: Secondary | ICD-10-CM | POA: Diagnosis not present

## 2012-05-21 DIAGNOSIS — Z87891 Personal history of nicotine dependence: Secondary | ICD-10-CM

## 2012-05-21 DIAGNOSIS — S88119A Complete traumatic amputation at level between knee and ankle, unspecified lower leg, initial encounter: Secondary | ICD-10-CM | POA: Diagnosis not present

## 2012-05-21 DIAGNOSIS — Z4889 Encounter for other specified surgical aftercare: Secondary | ICD-10-CM | POA: Diagnosis not present

## 2012-05-21 DIAGNOSIS — M6281 Muscle weakness (generalized): Secondary | ICD-10-CM | POA: Diagnosis not present

## 2012-05-21 DIAGNOSIS — S88911A Complete traumatic amputation of right lower leg, level unspecified, initial encounter: Secondary | ICD-10-CM | POA: Diagnosis not present

## 2012-05-21 DIAGNOSIS — S8253XB Displaced fracture of medial malleolus of unspecified tibia, initial encounter for open fracture type I or II: Secondary | ICD-10-CM | POA: Diagnosis not present

## 2012-05-21 DIAGNOSIS — S52539A Colles' fracture of unspecified radius, initial encounter for closed fracture: Secondary | ICD-10-CM | POA: Diagnosis not present

## 2012-05-21 DIAGNOSIS — F17200 Nicotine dependence, unspecified, uncomplicated: Secondary | ICD-10-CM | POA: Diagnosis not present

## 2012-05-21 DIAGNOSIS — S42309D Unspecified fracture of shaft of humerus, unspecified arm, subsequent encounter for fracture with routine healing: Secondary | ICD-10-CM | POA: Diagnosis not present

## 2012-05-21 DIAGNOSIS — Y9289 Other specified places as the place of occurrence of the external cause: Secondary | ICD-10-CM

## 2012-05-21 DIAGNOSIS — S298XXA Other specified injuries of thorax, initial encounter: Secondary | ICD-10-CM | POA: Diagnosis not present

## 2012-05-21 DIAGNOSIS — S82899A Other fracture of unspecified lower leg, initial encounter for closed fracture: Secondary | ICD-10-CM | POA: Diagnosis not present

## 2012-05-21 DIAGNOSIS — S8291XC Unspecified fracture of right lower leg, initial encounter for open fracture type IIIA, IIIB, or IIIC: Secondary | ICD-10-CM

## 2012-05-21 DIAGNOSIS — S92309A Fracture of unspecified metatarsal bone(s), unspecified foot, initial encounter for closed fracture: Secondary | ICD-10-CM | POA: Diagnosis not present

## 2012-05-21 DIAGNOSIS — IMO0002 Reserved for concepts with insufficient information to code with codable children: Secondary | ICD-10-CM | POA: Diagnosis not present

## 2012-05-21 DIAGNOSIS — R279 Unspecified lack of coordination: Secondary | ICD-10-CM | POA: Diagnosis not present

## 2012-05-21 DIAGNOSIS — F329 Major depressive disorder, single episode, unspecified: Secondary | ICD-10-CM | POA: Diagnosis not present

## 2012-05-21 HISTORY — DX: Depression, unspecified: F32.A

## 2012-05-21 HISTORY — DX: Major depressive disorder, single episode, unspecified: F32.9

## 2012-05-21 HISTORY — PX: AMPUTATION: SHX166

## 2012-05-21 HISTORY — DX: Adverse effect of unspecified anesthetic, initial encounter: T41.45XA

## 2012-05-21 HISTORY — DX: Other complications of anesthesia, initial encounter: T88.59XA

## 2012-05-21 HISTORY — DX: Anxiety disorder, unspecified: F41.9

## 2012-05-21 LAB — POCT I-STAT, CHEM 8
Calcium, Ion: 1.1 mmol/L — ABNORMAL LOW (ref 1.13–1.30)
Creatinine, Ser: 1 mg/dL (ref 0.50–1.35)
Glucose, Bld: 141 mg/dL — ABNORMAL HIGH (ref 70–99)
HCT: 48 % (ref 39.0–52.0)
Hemoglobin: 16.3 g/dL (ref 13.0–17.0)

## 2012-05-21 LAB — COMPREHENSIVE METABOLIC PANEL
Alkaline Phosphatase: 105 U/L (ref 39–117)
BUN: 6 mg/dL (ref 6–23)
CO2: 25 mEq/L (ref 19–32)
Chloride: 99 mEq/L (ref 96–112)
GFR calc Af Amer: >90 mL/min (ref >90)
GFR calc non Af Amer: 79 mL/min — ABNORMAL LOW (ref >90)
Glucose, Bld: 141 mg/dL — ABNORMAL HIGH (ref 70–99)
Potassium: 3.5 mEq/L (ref 3.5–5.1)
Total Bilirubin: 0.2 mg/dL — ABNORMAL LOW (ref 0.3–1.2)
Total Protein: 7.2 g/dL (ref 6.0–8.3)

## 2012-05-21 LAB — CBC
MCHC: 35.8 g/dL (ref 30.0–36.0)
Platelets: 287 10*3/uL (ref 150–400)
RDW: 14.1 % (ref 11.5–15.5)
WBC: 5.2 10*3/uL (ref 4.0–10.5)

## 2012-05-21 LAB — PROTIME-INR: INR: 1.04 (ref 0.00–1.49)

## 2012-05-21 LAB — SAMPLE TO BLOOD BANK

## 2012-05-21 SURGERY — AMPUTATION BELOW KNEE
Anesthesia: General | Site: Arm Lower | Laterality: Right | Wound class: Clean

## 2012-05-21 MED ORDER — TETANUS-DIPHTHERIA TOXOIDS TD 5-2 LFU IM INJ: 0.5000 mL | INJECTION | Freq: Once | INTRAMUSCULAR | Status: DC

## 2012-05-21 MED ORDER — OXYCODONE HCL 5 MG/5ML PO SOLN: 5.0000 mg | Freq: Once | ORAL | Status: DC | PRN

## 2012-05-21 MED ORDER — HYDROXYZINE PAMOATE 25 MG PO CAPS
25.0000 mg | ORAL_CAPSULE | Freq: Three times a day (TID) | ORAL | Status: DC | PRN
  Filled 2012-05-21: qty 1

## 2012-05-21 MED ORDER — FENTANYL CITRATE 0.05 MG/ML IJ SOLN
INTRAMUSCULAR | Status: AC
  Filled 2012-05-21: qty 2

## 2012-05-21 MED ORDER — SODIUM CHLORIDE 0.9 % IV SOLN
INTRAVENOUS | Status: DC
  Administered 2012-05-21: 1000 mL via INTRAVENOUS

## 2012-05-21 MED ORDER — HYDROMORPHONE HCL PF 1 MG/ML IJ SOLN
INTRAMUSCULAR | Status: AC
  Administered 2012-05-21: 0.5 mg via INTRAVENOUS
  Filled 2012-05-21: qty 1

## 2012-05-21 MED ORDER — FENTANYL CITRATE 0.05 MG/ML IJ SOLN
INTRAMUSCULAR | Status: DC | PRN
  Administered 2012-05-21: 100 ug via INTRAVENOUS
  Administered 2012-05-21: 50 ug via INTRAVENOUS

## 2012-05-21 MED ORDER — WHITE PETROLATUM GEL
Status: AC
  Administered 2012-05-21
  Filled 2012-05-21: qty 5

## 2012-05-21 MED ORDER — CEFAZOLIN SODIUM-DEXTROSE 2-3 GM-% IV SOLR
INTRAVENOUS | Status: DC | PRN
  Administered 2012-05-21: 2 g via INTRAVENOUS

## 2012-05-21 MED ORDER — HYDROCODONE-ACETAMINOPHEN 5-325 MG PO TABS
1.0000 | ORAL_TABLET | ORAL | Status: DC | PRN
  Administered 2012-05-23 – 2012-05-24 (×3): 2 via ORAL
  Filled 2012-05-21 (×3): qty 2

## 2012-05-21 MED ORDER — HYDROMORPHONE HCL PF 1 MG/ML IJ SOLN
1.0000 mg | Freq: Once | INTRAMUSCULAR | Status: AC
  Administered 2012-05-21: 1 mg via INTRAVENOUS
  Filled 2012-05-21: qty 1

## 2012-05-21 MED ORDER — HYDROMORPHONE HCL PF 1 MG/ML IJ SOLN
0.2500 mg | INTRAMUSCULAR | Status: DC | PRN
  Administered 2012-05-21 (×2): 0.5 mg via INTRAVENOUS

## 2012-05-21 MED ORDER — OXYCODONE-ACETAMINOPHEN 5-325 MG PO TABS
ORAL_TABLET | ORAL | Status: AC
  Filled 2012-05-21: qty 2

## 2012-05-21 MED ORDER — BISACODYL 5 MG PO TBEC
5.0000 mg | DELAYED_RELEASE_TABLET | Freq: Every day | ORAL | Status: DC | PRN
  Administered 2012-05-22: 5 mg via ORAL
  Filled 2012-05-21: qty 1

## 2012-05-21 MED ORDER — GABAPENTIN 100 MG PO CAPS
100.0000 mg | ORAL_CAPSULE | Freq: Three times a day (TID) | ORAL | Status: DC
  Administered 2012-05-21 – 2012-05-24 (×8): 100 mg via ORAL
  Filled 2012-05-21 (×10): qty 1

## 2012-05-21 MED ORDER — CEFAZOLIN SODIUM 1-5 GM-% IV SOLN
1.0000 g | Freq: Four times a day (QID) | INTRAVENOUS | Status: AC
  Administered 2012-05-21 – 2012-05-22 (×3): 1 g via INTRAVENOUS
  Filled 2012-05-21 (×4): qty 50

## 2012-05-21 MED ORDER — TETANUS-DIPHTH-ACELL PERTUSSIS 5-2.5-18.5 LF-MCG/0.5 IM SUSP
0.5000 mL | Freq: Once | INTRAMUSCULAR | Status: AC
  Administered 2012-05-21: 0.5 mL via INTRAMUSCULAR
  Filled 2012-05-21: qty 0.5

## 2012-05-21 MED ORDER — SODIUM CHLORIDE 0.9 % IV SOLN
INTRAVENOUS | Status: DC | PRN
  Administered 2012-05-21: 17:00:00 via INTRAVENOUS

## 2012-05-21 MED ORDER — ONDANSETRON HCL 4 MG PO TABS: 4.0000 mg | ORAL_TABLET | Freq: Four times a day (QID) | ORAL | Status: DC | PRN

## 2012-05-21 MED ORDER — ONDANSETRON HCL 4 MG/2ML IJ SOLN: 4.0000 mg | Freq: Four times a day (QID) | INTRAMUSCULAR | Status: DC | PRN

## 2012-05-21 MED ORDER — OXYCODONE HCL 5 MG PO TABS: 5.0000 mg | ORAL_TABLET | Freq: Once | ORAL | Status: DC | PRN

## 2012-05-21 MED ORDER — PROMETHAZINE HCL 25 MG/ML IJ SOLN: 6.2500 mg | INTRAMUSCULAR | Status: DC | PRN

## 2012-05-21 MED ORDER — CEFAZOLIN SODIUM-DEXTROSE 2-3 GM-% IV SOLR
2.0000 g | Freq: Once | INTRAVENOUS | Status: AC
  Administered 2012-05-21: 2 g via INTRAVENOUS
  Filled 2012-05-21: qty 50

## 2012-05-21 MED ORDER — ONDANSETRON HCL 4 MG/2ML IJ SOLN
INTRAMUSCULAR | Status: AC
  Administered 2012-05-21: 4 mg
  Filled 2012-05-21: qty 2

## 2012-05-21 MED ORDER — METOCLOPRAMIDE HCL 5 MG/ML IJ SOLN: 5.0000 mg | Freq: Three times a day (TID) | INTRAMUSCULAR | Status: DC | PRN

## 2012-05-21 MED ORDER — DOCUSATE SODIUM 100 MG PO CAPS
100.0000 mg | ORAL_CAPSULE | Freq: Two times a day (BID) | ORAL | Status: DC
  Administered 2012-05-21 – 2012-05-24 (×6): 100 mg via ORAL
  Filled 2012-05-21 (×9): qty 1

## 2012-05-21 MED ORDER — METOCLOPRAMIDE HCL 10 MG PO TABS: 5.0000 mg | ORAL_TABLET | Freq: Three times a day (TID) | ORAL | Status: DC | PRN

## 2012-05-21 MED ORDER — METHOCARBAMOL 100 MG/ML IJ SOLN
500.0000 mg | Freq: Four times a day (QID) | INTRAVENOUS | Status: DC | PRN
  Administered 2012-05-21 – 2012-05-22 (×2): 500 mg via INTRAVENOUS
  Filled 2012-05-21 (×5): qty 5

## 2012-05-21 MED ORDER — LIDOCAINE HCL (CARDIAC) 20 MG/ML IV SOLN
INTRAVENOUS | Status: DC | PRN
  Administered 2012-05-21: 100 mg via INTRAVENOUS

## 2012-05-21 MED ORDER — PATIENT'S GUIDE TO USING COUMADIN BOOK
Freq: Once | Status: AC
  Administered 2012-05-21: 21:00:00
  Filled 2012-05-21: qty 1

## 2012-05-21 MED ORDER — LACTATED RINGERS IV SOLN
INTRAVENOUS | Status: DC | PRN
  Administered 2012-05-21: 18:00:00 via INTRAVENOUS

## 2012-05-21 MED ORDER — PROPOFOL 10 MG/ML IV BOLUS
INTRAVENOUS | Status: DC | PRN
  Administered 2012-05-21: 130 mg via INTRAVENOUS
  Administered 2012-05-21: 20 mg via INTRAVENOUS

## 2012-05-21 MED ORDER — ALBUMIN HUMAN 5 % IV SOLN
INTRAVENOUS | Status: DC | PRN
  Administered 2012-05-21: 18:00:00 via INTRAVENOUS

## 2012-05-21 MED ORDER — SUCCINYLCHOLINE CHLORIDE 20 MG/ML IJ SOLN
INTRAMUSCULAR | Status: DC | PRN
  Administered 2012-05-21: 120 mg via INTRAVENOUS

## 2012-05-21 MED ORDER — SENNOSIDES-DOCUSATE SODIUM 8.6-50 MG PO TABS: 1.0000 | ORAL_TABLET | Freq: Every evening | ORAL | Status: DC | PRN

## 2012-05-21 MED ORDER — WARFARIN - PHARMACIST DOSING INPATIENT: Freq: Every day | Status: DC

## 2012-05-21 MED ORDER — HYDROMORPHONE HCL PF 1 MG/ML IJ SOLN
0.5000 mg | INTRAMUSCULAR | Status: DC | PRN
  Administered 2012-05-21 – 2012-05-23 (×7): 1 mg via INTRAVENOUS
  Filled 2012-05-21 (×7): qty 1

## 2012-05-21 MED ORDER — METHOCARBAMOL 500 MG PO TABS
500.0000 mg | ORAL_TABLET | Freq: Four times a day (QID) | ORAL | Status: DC | PRN
  Administered 2012-05-23 – 2012-05-24 (×5): 500 mg via ORAL
  Filled 2012-05-21 (×6): qty 1

## 2012-05-21 MED ORDER — 0.9 % SODIUM CHLORIDE (POUR BTL) OPTIME
TOPICAL | Status: DC | PRN
  Administered 2012-05-21: 1000 mL

## 2012-05-21 MED ORDER — WARFARIN SODIUM 5 MG PO TABS
5.0000 mg | ORAL_TABLET | Freq: Every day | ORAL | Status: DC
  Administered 2012-05-21: 5 mg via ORAL
  Filled 2012-05-21 (×2): qty 1

## 2012-05-21 MED ORDER — CITALOPRAM HYDROBROMIDE 20 MG PO TABS
20.0000 mg | ORAL_TABLET | Freq: Every day | ORAL | Status: DC
  Administered 2012-05-22 – 2012-05-24 (×3): 20 mg via ORAL
  Filled 2012-05-21 (×3): qty 1

## 2012-05-21 MED ORDER — FENTANYL CITRATE 0.05 MG/ML IJ SOLN
50.0000 ug | Freq: Once | INTRAMUSCULAR | Status: AC
  Administered 2012-05-21: 50 ug via INTRAVENOUS

## 2012-05-21 MED ORDER — OXYCODONE-ACETAMINOPHEN 5-325 MG PO TABS
1.0000 | ORAL_TABLET | ORAL | Status: DC | PRN
  Administered 2012-05-21 – 2012-05-24 (×5): 2 via ORAL
  Filled 2012-05-21 (×4): qty 2

## 2012-05-21 MED ORDER — WARFARIN VIDEO: Freq: Once | Status: DC

## 2012-05-21 SURGICAL SUPPLY — 48 items
BANDAGE ESMARK 6X9 LF (GAUZE/BANDAGES/DRESSINGS) ×1 IMPLANT
BANDAGE GAUZE ELAST BULKY 4 IN (GAUZE/BANDAGES/DRESSINGS) ×3 IMPLANT
BLADE SAW RECIP 87.9 MT (BLADE) ×2 IMPLANT
BLADE SURG 21 STRL SS (BLADE) ×2 IMPLANT
BNDG CMPR 9X6 STRL LF SNTH (GAUZE/BANDAGES/DRESSINGS) ×1
BNDG COHESIVE 4X5 TAN STRL (GAUZE/BANDAGES/DRESSINGS) ×1 IMPLANT
BNDG COHESIVE 6X5 TAN STRL LF (GAUZE/BANDAGES/DRESSINGS) ×2 IMPLANT
BNDG ESMARK 6X9 LF (GAUZE/BANDAGES/DRESSINGS) ×2
CLOTH BEACON ORANGE TIMEOUT ST (SAFETY) ×2 IMPLANT
COVER SURGICAL LIGHT HANDLE (MISCELLANEOUS) ×2 IMPLANT
CUFF TOURNIQUET SINGLE 34IN LL (TOURNIQUET CUFF) IMPLANT
CUFF TOURNIQUET SINGLE 44IN (TOURNIQUET CUFF) IMPLANT
DRAIN PENROSE 1/2X12 LTX STRL (WOUND CARE) IMPLANT
DRAPE EXTREMITY T 121X128X90 (DRAPE) ×2 IMPLANT
DRAPE PROXIMA HALF (DRAPES) ×2 IMPLANT
DRAPE U-SHAPE 47X51 STRL (DRAPES) ×4 IMPLANT
DRSG ADAPTIC 3X8 NADH LF (GAUZE/BANDAGES/DRESSINGS) ×2 IMPLANT
DRSG PAD ABDOMINAL 8X10 ST (GAUZE/BANDAGES/DRESSINGS) ×2 IMPLANT
DURAPREP 26ML APPLICATOR (WOUND CARE) ×2 IMPLANT
ELECT REM PT RETURN 9FT ADLT (ELECTROSURGICAL) ×2
ELECTRODE REM PT RTRN 9FT ADLT (ELECTROSURGICAL) ×1 IMPLANT
EVACUATOR 1/8 PVC DRAIN (DRAIN) IMPLANT
GLOVE BIOGEL PI IND STRL 9 (GLOVE) ×1 IMPLANT
GLOVE BIOGEL PI INDICATOR 9 (GLOVE) ×1
GLOVE SURG ORTHO 9.0 STRL STRW (GLOVE) ×2 IMPLANT
GOWN PREVENTION PLUS XLARGE (GOWN DISPOSABLE) ×2 IMPLANT
GOWN SRG XL XLNG 56XLVL 4 (GOWN DISPOSABLE) ×1 IMPLANT
GOWN STRL NON-REIN XL XLG LVL4 (GOWN DISPOSABLE) ×2
KIT BASIN OR (CUSTOM PROCEDURE TRAY) ×2 IMPLANT
KIT ROOM TURNOVER OR (KITS) ×2 IMPLANT
MANIFOLD NEPTUNE II (INSTRUMENTS) ×1 IMPLANT
NS IRRIG 1000ML POUR BTL (IV SOLUTION) ×2 IMPLANT
PACK GENERAL/GYN (CUSTOM PROCEDURE TRAY) ×2 IMPLANT
PAD ARMBOARD 7.5X6 YLW CONV (MISCELLANEOUS) ×4 IMPLANT
PAD CAST 4YDX4 CTTN HI CHSV (CAST SUPPLIES) IMPLANT
PADDING CAST COTTON 4X4 STRL (CAST SUPPLIES) ×2
SPONGE GAUZE 4X4 12PLY (GAUZE/BANDAGES/DRESSINGS) ×2 IMPLANT
SPONGE LAP 18X18 X RAY DECT (DISPOSABLE) ×1 IMPLANT
STAPLER VISISTAT 35W (STAPLE) IMPLANT
STOCKINETTE IMPERVIOUS LG (DRAPES) ×2 IMPLANT
SUT PDS AB 1 CT  36 (SUTURE)
SUT PDS AB 1 CT 36 (SUTURE) IMPLANT
SUT SILK 2 0 (SUTURE) ×2
SUT SILK 2-0 18XBRD TIE 12 (SUTURE) ×1 IMPLANT
TOWEL OR 17X24 6PK STRL BLUE (TOWEL DISPOSABLE) ×2 IMPLANT
TOWEL OR 17X26 10 PK STRL BLUE (TOWEL DISPOSABLE) ×2 IMPLANT
TUBE ANAEROBIC SPECIMEN COL (MISCELLANEOUS) IMPLANT
WATER STERILE IRR 1000ML POUR (IV SOLUTION) ×2 IMPLANT
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
## 2012-05-21 NOTE — Anesthesia Postprocedure Evaluation (Signed)
Anesthesia Post Note  Patient: Evan [name]  Procedure(s) Performed: Procedure(s) (LRB): AMPUTATION BELOW KNEE (Right)  Anesthesia type: general  Patient location: PACU  Post pain: Pain level controlled  Post assessment: Patient's Cardiovascular Status Stable  Last Vitals:  Filed Vitals:   05/21/12 1933  BP: 170/106  Pulse: 100  Temp: 36.9 C  Resp: 18    Post vital signs: Reviewed and stable  Level of consciousness: sedated  Complications: No apparent anesthesia complications

## 2012-05-21 NOTE — Anesthesia Procedure Notes (Signed)
Procedure Name: Intubation Date/Time: 05/21/2012 5:26 PM Performed by: Alanda Amass A Pre-anesthesia Checklist: Patient identified, Timeout performed, Emergency Drugs available, Suction available and Patient being monitored Patient Re-evaluated:Patient Re-evaluated prior to inductionOxygen Delivery Method: Circle system utilized Preoxygenation: Pre-oxygenation with 100% oxygen Intubation Type: IV induction, Rapid sequence and Combination inhalational/ intravenous induction Laryngoscope Size: Mac and 3 Grade View: Grade I Tube size: 8.0 mm Number of attempts: 1 Airway Equipment and Method: Stylet Secured at: 21 cm Tube secured with: Tape Dental Injury: Teeth and Oropharynx as per pre-operative assessment

## 2012-05-21 NOTE — ED Notes (Signed)
Family at the bedside to discuss plans with Dr. Lajoyce Corners

## 2012-05-21 NOTE — Anesthesia Preprocedure Evaluation (Addendum)
Anesthesia Evaluation  Patient identified by MRN, date of birth, ID band Patient awake    Reviewed: Allergy & Precautions, H&P , NPO status , Patient's Chart, lab work & pertinent test results  Airway Mallampati: II TM Distance: >3 FB Neck ROM: Full    Dental no notable dental hx. (+) Dental Advisory Given   Pulmonary COPDCurrent Smoker,  Lung Cancer   Pulmonary exam normal       Cardiovascular hypertension,     Neuro/Psych PSYCHIATRIC DISORDERS Depression negative neurological ROS     GI/Hepatic negative GI ROS, Neg liver ROS,   Endo/Other  negative endocrine ROS  Renal/GU negative Renal ROS     Musculoskeletal   Abdominal   Peds  Hematology   Anesthesia Other Findings   Reproductive/Obstetrics                          Anesthesia Physical Anesthesia Plan  ASA: III and emergent  Anesthesia Plan: General   Post-op Pain Management:    Induction: Intravenous, Rapid sequence and Cricoid pressure planned  Airway Management Planned: Oral ETT  Additional Equipment:   Intra-op Plan:   Post-operative Plan: Extubation in OR  Informed Consent: I have reviewed the patients History and Physical, chart, labs and discussed the procedure including the risks, benefits and alternatives for the proposed anesthesia with the patient or authorized representative who has indicated his/her understanding and acceptance.   Dental advisory given  Plan Discussed with: CRNA, Anesthesiologist and Surgeon  Anesthesia Plan Comments:         Anesthesia Quick Evaluation
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
## 2012-05-21 NOTE — Transfer of Care (Signed)
Immediate Anesthesia Transfer of Care Note  Patient: Evan [name]  Procedure(s) Performed: Procedure(s): AMPUTATION BELOW KNEE (Right)  Patient Location: PACU  Anesthesia Type:General  Level of Consciousness: sedated  Airway & Oxygen Therapy: Patient Spontanous Breathing  Post-op Assessment: Report given to PACU RN and Post -op Vital signs reviewed and stable  Post vital signs: Reviewed and stable  Complications: No apparent anesthesia complications

## 2012-05-21 NOTE — Anesthesia Postprocedure Evaluation (Signed)
  Anesthesia Post-op Note  Patient: Evan [name]  Procedure(s) Performed: Procedure(s): AMPUTATION BELOW KNEE (Right)  Patient Location: PACU  Anesthesia Type:General  Level of Consciousness: awake  Airway and Oxygen Therapy: Patient Spontanous Breathing  Post-op Pain: mild  Post-op Assessment: Post-op Vital signs reviewed, Patient's Cardiovascular Status Stable, Respiratory Function Stable, Patent Airway and No signs of Nausea or vomiting  Post-op Vital Signs: Reviewed and stable  Complications: No apparent anesthesia complications
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
## 2012-05-22 ENCOUNTER — Inpatient Hospital Stay (HOSPITAL_COMMUNITY): Payer: Medicare Other

## 2012-05-22 ENCOUNTER — Encounter (HOSPITAL_COMMUNITY): Payer: Self-pay | Admitting: General Practice

## 2012-05-22 LAB — GLUCOSE, CAPILLARY
Glucose-Capillary: 150 mg/dL — ABNORMAL HIGH (ref 70–99)
Glucose-Capillary: 120 mg/dL — ABNORMAL HIGH (ref 70–99)

## 2012-05-22 LAB — PROTIME-INR
INR: 1.09 (ref 0.00–1.49)
Prothrombin Time: 14 seconds (ref 11.6–15.2)

## 2012-05-22 LAB — BASIC METABOLIC PANEL
BUN: 7 mg/dL (ref 6–23)
CO2: 29 mEq/L (ref 19–32)
Calcium: 8.7 mg/dL (ref 8.4–10.5)
Chloride: 98 mEq/L (ref 96–112)
Creatinine, Ser: 0.9 mg/dL (ref 0.50–1.35)
GFR calc Af Amer: >90 mL/min (ref >90)
GFR calc non Af Amer: >90 mL/min (ref >90)
Glucose, Bld: 104 mg/dL — ABNORMAL HIGH (ref 70–99)
Potassium: 3.8 mEq/L (ref 3.5–5.1)
Sodium: 134 mEq/L — ABNORMAL LOW (ref 135–145)

## 2012-05-22 MED ORDER — WARFARIN SODIUM 7.5 MG PO TABS
7.5000 mg | ORAL_TABLET | Freq: Once | ORAL | Status: AC
  Administered 2012-05-22: 7.5 mg via ORAL
  Filled 2012-05-22: qty 1
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
## 2012-05-22 NOTE — Progress Notes (Addendum)
Orthopedic Tech Progress Note Patient Details:  Evan [name] 02-28-1950 [id number] Right velcro wrist splint applied. Tolerated well.  Ortho Devices Type of Ortho Device: Velcro wrist splint Ortho Device/Splint Interventions: Application   Asia R Thompson 05/22/2012, 2:54 PM
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
## 2012-05-22 NOTE — Progress Notes (Signed)
UR COMPLETED  

## 2012-05-22 NOTE — Clinical Social Work Placement (Signed)
Clinical Social Work Department  CLINICAL SOCIAL WORK PLACEMENT NOTE  05/22/2012  Patient: Evan [name] Account Number: [id number]  Admit date: 05/21/2012  Clinical Social Worker: Sabino Niemann MSW Date/time: 05/22/2012 11;30 AM  Clinical Social Work is seeking post-discharge placement for this patient at the following level of care: SKILLED NURSING (*CSW will update this form in Epic as items are completed)  05/22/2012 Patient/family provided with [facility] Department of Clinical Social Work's list of facilities offering this level of care within the geographic area requested by the patient (or if unable, by the patient's family).  05/22/2012 Patient/family informed of their freedom to choose among providers that offer the needed level of care, that participate in Medicare, Medicaid or managed care program needed by the patient, have an available bed and are willing to accept the patient.  05/22/2012 Patient/family informed of [facility]' ownership interest in [facility], as well as of the fact that they are under no obligation to receive care at this facility.  PASARR submitted to EDS on  PASARR number received from EDS on  FL2 transmitted to all facilities in geographic area requested by pt/family on 05/22/2012  FL2 transmitted to all facilities within larger geographic area on  Patient informed that his/her managed care company has contracts with or will negotiate with certain facilities, including the following:  Patient/family informed of bed offers received:  Patient chooses bed at  Physician recommends and patient chooses bed at  Patient to be transferred to on  Patient to be transferred to facility by  The following physician request were entered in Epic:  Additional Comments:
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
## 2012-05-22 NOTE — Clinical Social Work Psychosocial (Signed)
Clinical Social Work Department  BRIEF PSYCHOSOCIAL ASSESSMENT  Patient: Evan [name]  Account Number: [id number]   Admit date: 05/21/12 Clinical Social Worker Della Scrivener Riley Kill, MSW Date/Time:  Referred by: Physician Date Referred:  Referred for   SNF Placement   Other Referral:  Interview type: Patient  Other interview type: Patient's daughter was present PSYCHOSOCIAL DATA  Living Status: Alone Admitted from facility:  Level of care:  Primary support name: Aarons,Sharee  Primary support relationship to patient: Daughter Degree of support available:  Strong and vested  CURRENT CONCERNS  Current Concerns   Post-Acute Placement   Other Concerns:  SOCIAL WORK ASSESSMENT / PLAN  CSW met with pt re: PT recommendation for SNF.   Pt lives alone   CSW explained placement process and answered questions.   Pt reports no preference at this time   CSW completed FL2 and initiated SNF search.     Assessment/plan status: Information/Referral to [facility]  Other assessment/ plan:  Information/referral to community resources:  SNF     PATIENT'S/FAMILY'S RESPONSE TO PLAN OF CARE:  Pt  reports he is agreeable to ST SNF in order to increase strength and independence with mobility prior to returning home  Pt verbalized understanding of placement process and appreciation for CSW assist.   Sabino Niemann, MSW [phone]

## 2012-05-23 ENCOUNTER — Encounter (HOSPITAL_COMMUNITY): Payer: Self-pay | Admitting: Orthopedic Surgery

## 2012-05-23 LAB — PROTIME-INR
INR: 1.05 (ref 0.00–1.49)
Prothrombin Time: 13.6 seconds (ref 11.6–15.2)

## 2012-05-23 LAB — BASIC METABOLIC PANEL
Chloride: 97 mEq/L (ref 96–112)
GFR calc Af Amer: >90 mL/min (ref >90)
Potassium: 4.1 mEq/L (ref 3.5–5.1)
Sodium: 134 mEq/L — ABNORMAL LOW (ref 135–145)

## 2012-05-23 LAB — GLUCOSE, CAPILLARY
Glucose-Capillary: 128 mg/dL — ABNORMAL HIGH (ref 70–99)
Glucose-Capillary: 127 mg/dL — ABNORMAL HIGH (ref 70–99)
Glucose-Capillary: 127 mg/dL — ABNORMAL HIGH (ref 70–99)

## 2012-05-23 MED ORDER — WARFARIN SODIUM 7.5 MG PO TABS
7.5000 mg | ORAL_TABLET | Freq: Once | ORAL | Status: AC
  Administered 2012-05-23: 7.5 mg via ORAL
  Filled 2012-05-23: qty 1

## 2012-05-23 MED ORDER — OXYCODONE-ACETAMINOPHEN 5-325 MG PO TABS: 1.0000 | ORAL_TABLET | ORAL | Status: DC | PRN

## 2012-05-23 MED ORDER — ASPIRIN EC 325 MG PO TBEC: 325.0000 mg | DELAYED_RELEASE_TABLET | Freq: Every day | ORAL | Status: DC

## 2012-05-23 NOTE — Discharge Summary (Signed)
Physician Discharge Summary  Patient ID: Evan [name] MRN: [id number] DOB/AGE: [birth year] 62 y.o.  Admit date: 05/21/2012 Discharge date: 05/23/2012  Admission Diagnoses: Traumatic right foot amputation. Right distal radius intra-articular fracture nondisplaced  Discharge Diagnoses: Same Active Problems:   * No active hospital problems. *   Discharged Condition: stable  Hospital Course: Patient's hospital course was essentially remarkable. Patient presented with a traumatic amputation of the right foot. He underwent a right transtibial amputation. Postoperatively patient noticed some wrist pain. Radiograph showed a nondisplaced intra-articular right distal radius fracture. Patient was placed in a splint. Patient did not have the ability to return to home independently and was discharged to short-term skilled nursing.  Consults: None  Significant Diagnostic Studies: labs: Routine labs  Treatments: surgery: Right transtibial amputation and closed treatment for right distal radius fracture.  Discharge Exam: Blood pressure 146/94, pulse 97, temperature 98.9 F (37.2 C), temperature source Oral, resp. rate 18, height 5\' 4"  (1.626 m), weight 63.504 kg (140 lb), SpO2 92.00%. Incision/Wound: dressing clean dry and intact  Disposition: Final discharge disposition not confirmed   Future Appointments Provider Department Dept Phone   07/19/2012 2:00 PM [facility] Only [facility] [phone]   07/19/2012 3:00 PM [facility] [phone]   Patient to arrive 15 minutes prior to appointment time. No solid food 4 hours prior to exam. Liquids and Medicines are okay.   07/24/2012 3:15 PM Si Gaul, MD [facility] [phone]       Medication List    ASK your doctor about these medications       citalopram 20 MG tablet  Commonly known as:  CELEXA  Take 20 mg by mouth daily.            Follow-up Information   Follow up with DUDA,MARCUS V, MD In 2 weeks.   Contact information:   [address] [place] [address] [phone]       Signed: Nadara Mustard 05/23/2012, 6:18 AM

## 2012-05-23 NOTE — Progress Notes (Signed)
Physical Therapy Treatment Patient Details Name: Evan [name] MRN: [id number] DOB: 03-27-1950 Today's Date: 05/23/2012 Time: 4098-1191 PT Time Calculation (min): 20 min  PT Assessment / Plan / Recommendation Comments on Treatment Session  Patient increased ambulation but required vc while ambulating.  Patient glad to participate in ther ex.    Follow Up Recommendations  SNF     Does the patient have the potential to tolerate intense rehabilitation     Barriers to Discharge        Equipment Recommendations  None recommended by PT    Recommendations for Other Services    Frequency Min 3X/week   Plan Discharge plan remains appropriate;Frequency remains appropriate    Precautions / Restrictions Precautions Precautions: Fall Restrictions Weight Bearing Restrictions: Yes RUE Weight Bearing: Non weight bearing RLE Weight Bearing: Non weight bearing   Pertinent Vitals/Pain no apparent distress     Mobility  Bed Mobility Bed Mobility: Supine to Sit;Sitting - Scoot to Edge of Bed Supine to Sit: 6: Modified independent (Device/Increase time);HOB flat Sitting - Scoot to Edge of Bed: 6: Modified independent (Device/Increase time);With rail Details for Bed Mobility Assistance: demo good technique; required min cues for hand placement and sequencing for sit to supine  Transfers Transfers: Sit to Stand;Stand to Sit Sit to Stand: 4: Min assist;From bed;With upper extremity assist Stand to Sit: 4: Min guard;To chair/3-in-1;With armrests;With upper extremity assist Details for Transfer Assistance: required min (A) for intial sit to stand transfer pt requried vc's for hand placement and safety with platform walker and to adhere to NWB status through R wrist.  pt required cues to wait for spta. Ambulation/Gait Ambulation/Gait Assistance: 4: Min guard Ambulation Distance (Feet): 30 Feet Assistive device: Right platform walker Ambulation/Gait Assistance Details: VC to stand upright and  safety with RW. Gait velocity: Decreased.    Exercises General Exercises - Lower Extremity Quad Sets: AROM;Right;10 reps Heel Slides: Right;10 reps;AAROM Hip ABduction/ADduction: AAROM;Right;10 reps Straight Leg Raises: AAROM;Right;10 reps   PT Diagnosis:    PT Problem List:   PT Treatment Interventions:     PT Goals Acute Rehab PT Goals PT Goal: Supine/Side to Sit - Progress: Progressing toward goal PT Goal: Sit to Stand - Progress: Progressing toward goal PT Goal: Ambulate - Progress: Progressing toward goal PT Goal: Perform Home Exercise Program - Progress: Progressing toward goal  Visit Information  Last PT Received On: 05/23/12 Assistance Needed: +1 PT/OT Co-Evaluation/Treatment: Yes    Subjective Data      Cognition  Cognition Arousal/Alertness: Awake/alert Behavior During Therapy: Flat affect Overall Cognitive Status: Within Functional Limits for tasks assessed    Balance     End of Session PT - End of Session Equipment Utilized During Treatment: Gait belt Activity Tolerance: Patient tolerated treatment well Patient left: in chair;with call bell/phone within reach   GP     [facility], Zebedee Segundo JEAN SPTA 05/23/2012, 2:28 PM
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
## 2012-05-23 NOTE — Progress Notes (Signed)
Patient ID: Evan [name], male   DOB: 06-04-1950, 62 y.o.   MRN: [id number] F. L2 completed. Patient's radiographs shows a nondisplaced intra-articular right distal radius fracture. Will treat this with closed treatment in a splint. Patient to be nonweightbearing to the wrist okay for her weightbearing for the forearm with a platform walker. Paperwork completed for discharge to rehabilitation facility.

## 2012-05-23 NOTE — Care Management Note (Signed)
CARE MANAGEMENT NOTE 05/23/2012  Patient:  LEGACY, LACIVITA   Account Number:  [id number]  Date Initiated:  05/23/2012  Documentation initiated by:  Vance Peper  Subjective/Objective Assessment:   62 yr old male s/p traumatic right foot transtibial amputation.     Action/Plan:   Patient will need shortterm rehab at [facility]. Social worker is aware.   Anticipated DC Date:  05/24/2012   Anticipated DC Plan:  SKILLED NURSING FACILITY  In-house referral  Clinical Social Worker      DC Planning Services  CM consult      [facility] Choice  NA   Choice offered to / List presented to:             Status of service:  Completed, signed off Medicare Important Message given?   (If response is "NO", the following Medicare IM given date fields will be blank) Date Medicare IM given:   Date Additional Medicare IM given:    Discharge Disposition:  SKILLED NURSING FACILITY  Per UR Regulation:    If discussed at Long Length of Stay Meetings, dates discussed:    Comments:

## 2012-05-24 DIAGNOSIS — R279 Unspecified lack of coordination: Secondary | ICD-10-CM | POA: Diagnosis not present

## 2012-05-24 DIAGNOSIS — I70269 Atherosclerosis of native arteries of extremities with gangrene, unspecified extremity: Secondary | ICD-10-CM | POA: Diagnosis not present

## 2012-05-24 DIAGNOSIS — R262 Difficulty in walking, not elsewhere classified: Secondary | ICD-10-CM | POA: Diagnosis not present

## 2012-05-24 DIAGNOSIS — I96 Gangrene, not elsewhere classified: Secondary | ICD-10-CM | POA: Diagnosis not present

## 2012-05-24 DIAGNOSIS — M6281 Muscle weakness (generalized): Secondary | ICD-10-CM | POA: Diagnosis not present

## 2012-05-24 DIAGNOSIS — Z5189 Encounter for other specified aftercare: Secondary | ICD-10-CM | POA: Diagnosis not present

## 2012-05-24 DIAGNOSIS — C3490 Malignant neoplasm of unspecified part of unspecified bronchus or lung: Secondary | ICD-10-CM | POA: Diagnosis not present

## 2012-05-24 DIAGNOSIS — F17200 Nicotine dependence, unspecified, uncomplicated: Secondary | ICD-10-CM | POA: Diagnosis not present

## 2012-05-24 DIAGNOSIS — T8140XA Infection following a procedure, unspecified, initial encounter: Secondary | ICD-10-CM | POA: Diagnosis not present

## 2012-05-24 DIAGNOSIS — S42309D Unspecified fracture of shaft of humerus, unspecified arm, subsequent encounter for fracture with routine healing: Secondary | ICD-10-CM | POA: Diagnosis not present

## 2012-05-24 DIAGNOSIS — I10 Essential (primary) hypertension: Secondary | ICD-10-CM | POA: Diagnosis not present

## 2012-05-24 DIAGNOSIS — S42309S Unspecified fracture of shaft of humerus, unspecified arm, sequela: Secondary | ICD-10-CM | POA: Diagnosis not present

## 2012-05-24 DIAGNOSIS — S88119A Complete traumatic amputation at level between knee and ankle, unspecified lower leg, initial encounter: Secondary | ICD-10-CM | POA: Diagnosis not present

## 2012-05-24 DIAGNOSIS — F329 Major depressive disorder, single episode, unspecified: Secondary | ICD-10-CM | POA: Diagnosis not present

## 2012-05-24 DIAGNOSIS — T8789 Other complications of amputation stump: Secondary | ICD-10-CM | POA: Diagnosis not present

## 2012-05-24 DIAGNOSIS — Z85118 Personal history of other malignant neoplasm of bronchus and lung: Secondary | ICD-10-CM | POA: Diagnosis not present

## 2012-05-24 DIAGNOSIS — I739 Peripheral vascular disease, unspecified: Secondary | ICD-10-CM | POA: Diagnosis not present

## 2012-05-24 DIAGNOSIS — Z4889 Encounter for other specified surgical aftercare: Secondary | ICD-10-CM | POA: Diagnosis not present

## 2012-05-24 DIAGNOSIS — IMO0001 Reserved for inherently not codable concepts without codable children: Secondary | ICD-10-CM | POA: Diagnosis not present

## 2012-05-24 LAB — GLUCOSE, CAPILLARY
Glucose-Capillary: 110 mg/dL — ABNORMAL HIGH (ref 70–99)
Glucose-Capillary: 114 mg/dL — ABNORMAL HIGH (ref 70–99)

## 2012-05-24 LAB — BASIC METABOLIC PANEL
CO2: 30 mEq/L (ref 19–32)
Calcium: 9 mg/dL (ref 8.4–10.5)
Chloride: 99 mEq/L (ref 96–112)
Glucose, Bld: 133 mg/dL — ABNORMAL HIGH (ref 70–99)
Potassium: 4 mEq/L (ref 3.5–5.1)
Sodium: 135 mEq/L (ref 135–145)

## 2012-05-24 LAB — PROTIME-INR: INR: 1.77 — ABNORMAL HIGH (ref 0.00–1.49)

## 2012-05-24 MED ORDER — WARFARIN SODIUM 5 MG PO TABS
5.0000 mg | ORAL_TABLET | Freq: Once | ORAL | Status: DC
  Filled 2012-05-24: qty 1

## 2012-05-24 NOTE — Progress Notes (Signed)
Seen and agreed 05/24/2012 Sabrinna Yearwood Elizabeth PTA [phone] pager [phone] office    

## 2012-05-24 NOTE — Progress Notes (Signed)
Seen and agreed 05/24/2012 Evan [name] PTA [phone] pager [phone] office    

## 2012-05-24 NOTE — Progress Notes (Signed)
Called MD re: progress note for SNF facility per SW request.  Left detailed message with my direct line and SW number. Patient to be discharged as ordered.  Will continue to monitor for status changes.

## 2012-05-24 NOTE — Progress Notes (Signed)
Dr Lajoyce Corners returned call.  Will complete progress note today and no changes to discharge instructions.

## 2012-05-24 NOTE — Progress Notes (Signed)
Physical Therapy Treatment Patient Details Name: Evan [name] MRN: [id number] DOB: [birth year] Today's Date: 05/24/2012 Time: 0347-4259 PT Time Calculation (min): 15 min  PT Assessment / Plan / Recommendation Comments on Treatment Session  Patient increased ambulation but required vc while ambulating.  Patient glad to participate in ther ex.    Follow Up Recommendations  SNF     Does the patient have the potential to tolerate intense rehabilitation     Barriers to Discharge        Equipment Recommendations  None recommended by PT    Recommendations for Other Services    Frequency Min 3X/week   Plan Discharge plan remains appropriate;Frequency remains appropriate    Precautions / Restrictions Precautions Precautions: Fall Restrictions Weight Bearing Restrictions: Yes RUE Weight Bearing: Non weight bearing RLE Weight Bearing: Non weight bearing   Pertinent Vitals/Pain no apparent distress     Mobility  Bed Mobility Bed Mobility: Supine to Sit;Sitting - Scoot to Edge of Bed Supine to Sit: 6: Modified independent (Device/Increase time) Sitting - Scoot to Edge of Bed: 6: Modified independent (Device/Increase time) Details for Bed Mobility Assistance: Required min cues to wait for spta for safety. Transfers Transfers: Sit to Stand;Stand to Sit Sit to Stand: 5: Supervision Stand to Sit: 5: Supervision Details for Transfer Assistance: Supervision resulting from weight bearing status. Ambulation/Gait Ambulation/Gait Assistance: 4: Min guard Ambulation Distance (Feet): 50 Feet Assistive device: Right platform walker Ambulation/Gait Assistance Details: VC for safety with RW and to maintain NWB status of UE. Gait velocity: Decreased.    Exercises General Exercises - Lower Extremity Quad Sets: AROM;Right;10 reps Heel Slides: Right;10 reps;AAROM Hip ABduction/ADduction: AAROM;Right;10 reps   PT Diagnosis:    PT Problem List:   PT Treatment Interventions:     PT  Goals Acute Rehab PT Goals PT Goal: Supine/Side to Sit - Progress: Progressing toward goal PT Goal: Sit to Stand - Progress: Progressing toward goal PT Goal: Ambulate - Progress: Progressing toward goal PT Goal: Perform Home Exercise Program - Progress: Progressing toward goal  Visit Information  Last PT Received On: 05/24/12 Assistance Needed: +1    Subjective Data      Cognition  Cognition Arousal/Alertness: Awake/alert Behavior During Therapy: Flat affect Overall Cognitive Status: Within Functional Limits for tasks assessed    Balance     End of Session PT - End of Session Equipment Utilized During Treatment: Gait belt Activity Tolerance: Patient tolerated treatment well Patient left: in chair;with call bell/phone within reach   GP     [facility], Laren Whaling JEAN SPTA 05/24/2012, 12:15 PM

## 2012-05-24 NOTE — Progress Notes (Signed)
Clinical social worker assisted with patient discharge to skilled nursing facility, [facility].  CSW addressed all family questions and concerns. CSW copied chart and added all important documents. CSW also set up patient transportation with [facility]. Clinical Social Worker will sign off for now as social work intervention is no longer needed.  Gradie Butrick, MSW, [phone] 

## 2012-05-24 NOTE — Progress Notes (Addendum)
ANTICOAGULATION CONSULT NOTE - Follow Up Consult  Pharmacy Consult for coumadin Indication: VTE prophylaxis  No Known Allergies  Patient Measurements: Height: 5\' 4"  (162.6 cm) Weight: 140 lb (63.504 kg) IBW/kg (Calculated) : 59.2   Vital Signs: Temp: 98 F (36.7 C) (04/23 0642) Temp src: Oral (04/23 0642) BP: 139/91 mmHg (04/23 0642) Pulse Rate: 98 (04/23 0642)  Labs:  Recent Labs  05/21/12 1547 05/21/12 1604 05/22/12 0735 05/23/12 0548 05/24/12 0550  HGB 14.6 16.3  --   --   --   HCT 40.8 48.0  --   --   --   PLT 287  --   --   --   --   LABPROT 13.5  --  14.0 13.6 20.0*  INR 1.04  --  1.09 1.05 1.77*  CREATININE 1.01 1.00 0.90 0.79 0.79    Estimated Creatinine Clearance: 82.2 ml/min (by C-G formula based on Cr of 0.79).   Assessment: Patient is  a 62 y.o M on coumadin for VTE prophylaxis.  INR is subtherapeutic but increased noticeably from 1.05 to 1.77 today.  No bleeding noted.  Goal of Therapy:  INR 2-3    Plan:  1) decrease coumadin to 5mg  PO x1 today  Branna Cortina P 05/24/2012,12:01 PM

## 2012-05-24 NOTE — Progress Notes (Signed)
Patient requested pain medication before trip to SNF.

## 2012-05-24 NOTE — ED Provider Notes (Signed)
I have personally seen and examined the patient.  I have discussed the plan of care with the resident.  I have reviewed the documentation on PMH/FH/Soc. History.  I have reviewed the documentation of the resident and agree.  Doug Sou, MD 05/24/12 1731
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
## 2012-05-30 ENCOUNTER — Non-Acute Institutional Stay (SKILLED_NURSING_FACILITY): Payer: Medicare Other | Admitting: Internal Medicine

## 2012-05-30 DIAGNOSIS — F329 Major depressive disorder, single episode, unspecified: Secondary | ICD-10-CM

## 2012-05-30 DIAGNOSIS — C3490 Malignant neoplasm of unspecified part of unspecified bronchus or lung: Secondary | ICD-10-CM | POA: Diagnosis not present

## 2012-05-30 DIAGNOSIS — IMO0001 Reserved for inherently not codable concepts without codable children: Secondary | ICD-10-CM

## 2012-05-30 DIAGNOSIS — S52531S Colles' fracture of right radius, sequela: Secondary | ICD-10-CM

## 2012-05-30 DIAGNOSIS — S42309S Unspecified fracture of shaft of humerus, unspecified arm, sequela: Secondary | ICD-10-CM | POA: Diagnosis not present

## 2012-05-30 DIAGNOSIS — S99921S Unspecified injury of right foot, sequela: Secondary | ICD-10-CM

## 2012-06-07 ENCOUNTER — Other Ambulatory Visit: Payer: Self-pay | Admitting: Geriatric Medicine

## 2012-06-07 MED ORDER — OXYCODONE-ACETAMINOPHEN 5-325 MG PO TABS: 1.0000 | ORAL_TABLET | ORAL | Status: DC | PRN

## 2012-06-15 ENCOUNTER — Other Ambulatory Visit (HOSPITAL_COMMUNITY): Payer: Self-pay | Admitting: Orthopedic Surgery

## 2012-06-15 ENCOUNTER — Encounter (HOSPITAL_COMMUNITY): Payer: Self-pay | Admitting: Pharmacy Technician
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
## 2012-06-17 NOTE — Pre-Procedure Instructions (Signed)
Jamonta L Sindt  06/17/2012   Your procedure is scheduled on:  Wednesday, May 21st  Report to [facility] at 0915 AM.  Call this number if you have problems the morning of surgery: [phone]   Remember:   Do not eat food or drink liquids after midnight.   Take these medicines the morning of surgery with A SIP OF WATER: Celexa, Roxicet if needed   Do not wear jewelry, make-up or nail polish.  Do not wear lotions, powders, or perfumes. You may wear deodorant.  Do not shave 48 hours prior to surgery. Men may shave face and neck.  Do not bring valuables to the hospital.  Contacts, dentures or bridgework may not be worn into surgery.  Leave suitcase in the car. After surgery it may be brought to your room.  For patients admitted to the hospital, checkout time is 11:00 AM the day of  discharge.   Patients discharged the day of surgery will not be allowed to drive  home.    Special Instructions: Shower using CHG 2 nights before surgery and the night before surgery.  If you shower the day of surgery use CHG.  Use special wash - you have one bottle of CHG for all showers.  You should use approximately 1/3 of the bottle for each shower.   Please read over the following fact sheets that you were given: Pain Booklet, Coughing and Deep Breathing, MRSA Information and Surgical Site Infection Prevention

## 2012-06-19 ENCOUNTER — Encounter (HOSPITAL_COMMUNITY)
Admission: RE | Admit: 2012-06-19 | Discharge: 2012-06-19 | Disposition: A | Discharge status: Home / Self Care | Payer: Medicare Other | Source: Ambulatory Visit | Attending: Orthopedic Surgery | Admitting: Orthopedic Surgery

## 2012-06-19 ENCOUNTER — Encounter (HOSPITAL_COMMUNITY): Payer: Self-pay

## 2012-06-19 LAB — CBC
Hemoglobin: 12.4 g/dL — ABNORMAL LOW (ref 13.0–17.0)
MCHC: 33.1 g/dL (ref 30.0–36.0)
Platelets: 350 10*3/uL (ref 150–400)
RBC: 4.3 MIL/uL (ref 4.22–5.81)

## 2012-06-19 LAB — COMPREHENSIVE METABOLIC PANEL
ALT: 29 U/L (ref 0–53)
AST: 24 U/L (ref 0–37)
Alkaline Phosphatase: 110 U/L (ref 39–117)
CO2: 25 mEq/L (ref 19–32)
Calcium: 9.5 mg/dL (ref 8.4–10.5)
GFR calc Af Amer: >90 mL/min (ref >90)
Glucose, Bld: 94 mg/dL (ref 70–99)
Potassium: 4.2 mEq/L (ref 3.5–5.1)
Sodium: 137 mEq/L (ref 135–145)
Total Protein: 7.5 g/dL (ref 6.0–8.3)

## 2012-06-19 LAB — APTT: aPTT: 37 s (ref 24–37)

## 2012-06-19 LAB — SURGICAL PCR SCREEN
MRSA, PCR: NEGATIVE
Staphylococcus aureus: POSITIVE — AB
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
## 2012-06-19 NOTE — Progress Notes (Signed)
Pt needs Mupirocin treatment DOS; according to nurse that completed PAT assessment, unable to reach nurse because disconnected three times.

## 2012-06-20 MED ORDER — CEFAZOLIN SODIUM-DEXTROSE 2-3 GM-% IV SOLR
2.0000 g | INTRAVENOUS | Status: AC
  Administered 2012-06-21: 2 g via INTRAVENOUS
  Filled 2012-06-20: qty 50

## 2012-06-21 ENCOUNTER — Encounter (HOSPITAL_COMMUNITY): Payer: Self-pay | Admitting: *Deleted

## 2012-06-21 ENCOUNTER — Ambulatory Visit (HOSPITAL_COMMUNITY): Payer: Medicare Other | Admitting: Anesthesiology

## 2012-06-21 ENCOUNTER — Encounter (HOSPITAL_COMMUNITY)
Admission: RE | Disposition: A | Discharge status: Skilled Nursing Facility | Payer: Self-pay | Source: Ambulatory Visit | Attending: Orthopedic Surgery

## 2012-06-21 ENCOUNTER — Encounter (HOSPITAL_COMMUNITY): Payer: Self-pay | Admitting: Anesthesiology

## 2012-06-21 ENCOUNTER — Ambulatory Visit (HOSPITAL_COMMUNITY)
Admission: RE | Admit: 2012-06-21 | Discharge: 2012-06-21 | Disposition: A | Discharge status: Skilled Nursing Facility | Payer: Medicare Other | Source: Ambulatory Visit | Attending: Orthopedic Surgery | Admitting: Orthopedic Surgery

## 2012-06-21 DIAGNOSIS — T879 Unspecified complications of amputation stump: Secondary | ICD-10-CM

## 2012-06-21 DIAGNOSIS — I96 Gangrene, not elsewhere classified: Secondary | ICD-10-CM | POA: Diagnosis not present

## 2012-06-21 DIAGNOSIS — Y835 Amputation of limb(s) as the cause of abnormal reaction of the patient, or of later complication, without mention of misadventure at the time of the procedure: Secondary | ICD-10-CM | POA: Insufficient documentation

## 2012-06-21 DIAGNOSIS — T8789 Other complications of amputation stump: Secondary | ICD-10-CM | POA: Insufficient documentation

## 2012-06-21 DIAGNOSIS — I739 Peripheral vascular disease, unspecified: Secondary | ICD-10-CM | POA: Insufficient documentation

## 2012-06-21 DIAGNOSIS — T8140XA Infection following a procedure, unspecified, initial encounter: Secondary | ICD-10-CM | POA: Diagnosis not present

## 2012-06-21 DIAGNOSIS — F3289 Other specified depressive episodes: Secondary | ICD-10-CM | POA: Insufficient documentation

## 2012-06-21 DIAGNOSIS — Z79899 Other long term (current) drug therapy: Secondary | ICD-10-CM | POA: Insufficient documentation

## 2012-06-21 DIAGNOSIS — F17200 Nicotine dependence, unspecified, uncomplicated: Secondary | ICD-10-CM | POA: Insufficient documentation

## 2012-06-21 DIAGNOSIS — F329 Major depressive disorder, single episode, unspecified: Secondary | ICD-10-CM | POA: Insufficient documentation

## 2012-06-21 DIAGNOSIS — I70269 Atherosclerosis of native arteries of extremities with gangrene, unspecified extremity: Secondary | ICD-10-CM | POA: Diagnosis not present

## 2012-06-21 DIAGNOSIS — Z85118 Personal history of other malignant neoplasm of bronchus and lung: Secondary | ICD-10-CM | POA: Diagnosis not present

## 2012-06-21 DIAGNOSIS — F411 Generalized anxiety disorder: Secondary | ICD-10-CM | POA: Insufficient documentation

## 2012-06-21 HISTORY — PX: AMPUTATION: SHX166

## 2012-06-21 SURGERY — AMPUTATION BELOW KNEE
Anesthesia: General | Site: Leg Lower | Laterality: Right | Wound class: Dirty or Infected

## 2012-06-21 MED ORDER — OXYCODONE-ACETAMINOPHEN 5-325 MG PO TABS
ORAL_TABLET | ORAL | Status: AC
  Filled 2012-06-21: qty 1

## 2012-06-21 MED ORDER — LACTATED RINGERS IV SOLN
INTRAVENOUS | Status: DC | PRN
  Administered 2012-06-21 (×2): via INTRAVENOUS

## 2012-06-21 MED ORDER — HYDROMORPHONE HCL PF 1 MG/ML IJ SOLN
0.2500 mg | INTRAMUSCULAR | Status: DC | PRN
  Administered 2012-06-21 (×2): 0.5 mg via INTRAVENOUS

## 2012-06-21 MED ORDER — LIDOCAINE HCL (CARDIAC) 20 MG/ML IV SOLN
INTRAVENOUS | Status: DC | PRN
  Administered 2012-06-21: 70 mg via INTRAVENOUS

## 2012-06-21 MED ORDER — FENTANYL CITRATE 0.05 MG/ML IJ SOLN
INTRAMUSCULAR | Status: DC | PRN
  Administered 2012-06-21: 100 ug via INTRAVENOUS

## 2012-06-21 MED ORDER — MIDAZOLAM HCL 5 MG/5ML IJ SOLN
INTRAMUSCULAR | Status: DC | PRN
  Administered 2012-06-21 (×2): 2 mg via INTRAVENOUS

## 2012-06-21 MED ORDER — 0.9 % SODIUM CHLORIDE (POUR BTL) OPTIME
TOPICAL | Status: DC | PRN
  Administered 2012-06-21: 1000 mL

## 2012-06-21 MED ORDER — OXYCODONE-ACETAMINOPHEN 5-325 MG PO TABS
1.0000 | ORAL_TABLET | Freq: Once | ORAL | Status: AC
  Administered 2012-06-21: 1 via ORAL

## 2012-06-21 MED ORDER — MUPIROCIN 2 % EX OINT: TOPICAL_OINTMENT | Freq: Two times a day (BID) | CUTANEOUS | Status: DC

## 2012-06-21 MED ORDER — MUPIROCIN 2 % EX OINT
TOPICAL_OINTMENT | CUTANEOUS | Status: AC
  Filled 2012-06-21: qty 22

## 2012-06-21 MED ORDER — ONDANSETRON HCL 4 MG/2ML IJ SOLN
INTRAMUSCULAR | Status: DC | PRN
  Administered 2012-06-21: 4 mg via INTRAVENOUS

## 2012-06-21 MED ORDER — OXYCODONE-ACETAMINOPHEN 5-325 MG PO TABS: 1.0000 | ORAL_TABLET | ORAL | Status: DC | PRN

## 2012-06-21 MED ORDER — PROPOFOL 10 MG/ML IV BOLUS
INTRAVENOUS | Status: DC | PRN
  Administered 2012-06-21: 200 mg via INTRAVENOUS

## 2012-06-21 MED ORDER — LACTATED RINGERS IV SOLN
INTRAVENOUS | Status: DC
  Administered 2012-06-21: 11:00:00 via INTRAVENOUS

## 2012-06-21 SURGICAL SUPPLY — 47 items
BANDAGE ESMARK 6X9 LF (GAUZE/BANDAGES/DRESSINGS) ×1 IMPLANT
BANDAGE GAUZE ELAST BULKY 4 IN (GAUZE/BANDAGES/DRESSINGS) ×3 IMPLANT
BLADE SAW RECIP 87.9 MT (BLADE) ×1 IMPLANT
BLADE SURG 21 STRL SS (BLADE) ×2 IMPLANT
BNDG CMPR 9X6 STRL LF SNTH (GAUZE/BANDAGES/DRESSINGS)
BNDG COHESIVE 4X5 TAN STRL (GAUZE/BANDAGES/DRESSINGS) ×1 IMPLANT
BNDG COHESIVE 6X5 TAN STRL LF (GAUZE/BANDAGES/DRESSINGS) ×2 IMPLANT
BNDG ESMARK 6X9 LF (GAUZE/BANDAGES/DRESSINGS)
CLOTH BEACON ORANGE TIMEOUT ST (SAFETY) ×2 IMPLANT
COVER SURGICAL LIGHT HANDLE (MISCELLANEOUS) ×2 IMPLANT
CUFF TOURNIQUET SINGLE 34IN LL (TOURNIQUET CUFF) IMPLANT
CUFF TOURNIQUET SINGLE 44IN (TOURNIQUET CUFF) IMPLANT
DRAIN PENROSE 1/2X12 LTX STRL (WOUND CARE) IMPLANT
DRAPE EXTREMITY T 121X128X90 (DRAPE) ×2 IMPLANT
DRAPE PROXIMA HALF (DRAPES) ×4 IMPLANT
DRAPE U-SHAPE 47X51 STRL (DRAPES) ×3 IMPLANT
DRSG ADAPTIC 3X8 NADH LF (GAUZE/BANDAGES/DRESSINGS) ×2 IMPLANT
DRSG PAD ABDOMINAL 8X10 ST (GAUZE/BANDAGES/DRESSINGS) ×3 IMPLANT
DURAPREP 26ML APPLICATOR (WOUND CARE) ×2 IMPLANT
ELECT REM PT RETURN 9FT ADLT (ELECTROSURGICAL) ×2
ELECTRODE REM PT RTRN 9FT ADLT (ELECTROSURGICAL) ×1 IMPLANT
EVACUATOR 1/8 PVC DRAIN (DRAIN) IMPLANT
GLOVE BIOGEL PI IND STRL 9 (GLOVE) ×1 IMPLANT
GLOVE BIOGEL PI INDICATOR 9 (GLOVE) ×1
GLOVE SURG ORTHO 9.0 STRL STRW (GLOVE) ×2 IMPLANT
GOWN PREVENTION PLUS XLARGE (GOWN DISPOSABLE) ×2 IMPLANT
GOWN SRG XL XLNG 56XLVL 4 (GOWN DISPOSABLE) ×1 IMPLANT
GOWN STRL NON-REIN XL XLG LVL4 (GOWN DISPOSABLE) ×2
KIT BASIN OR (CUSTOM PROCEDURE TRAY) ×2 IMPLANT
KIT ROOM TURNOVER OR (KITS) ×2 IMPLANT
MANIFOLD NEPTUNE II (INSTRUMENTS) ×2 IMPLANT
NS IRRIG 1000ML POUR BTL (IV SOLUTION) ×2 IMPLANT
PACK GENERAL/GYN (CUSTOM PROCEDURE TRAY) ×2 IMPLANT
PAD ARMBOARD 7.5X6 YLW CONV (MISCELLANEOUS) ×3 IMPLANT
SPONGE GAUZE 4X4 12PLY (GAUZE/BANDAGES/DRESSINGS) ×2 IMPLANT
SPONGE LAP 18X18 X RAY DECT (DISPOSABLE) ×1 IMPLANT
STAPLER VISISTAT 35W (STAPLE) IMPLANT
STOCKINETTE IMPERVIOUS LG (DRAPES) ×2 IMPLANT
SUT ETHILON 2 0 PSLX (SUTURE) ×2 IMPLANT
SUT PDS AB 1 CT  36 (SUTURE) ×2
SUT PDS AB 1 CT 36 (SUTURE) IMPLANT
SUT SILK 2 0 (SUTURE)
SUT SILK 2-0 18XBRD TIE 12 (SUTURE) ×1 IMPLANT
TOWEL OR 17X24 6PK STRL BLUE (TOWEL DISPOSABLE) ×2 IMPLANT
TOWEL OR 17X26 10 PK STRL BLUE (TOWEL DISPOSABLE) ×2 IMPLANT
TUBE ANAEROBIC SPECIMEN COL (MISCELLANEOUS) IMPLANT
WATER STERILE IRR 1000ML POUR (IV SOLUTION) ×1 IMPLANT

## 2012-06-21 NOTE — Op Note (Signed)
OPERATIVE REPORT  DATE OF SURGERY: 06/21/2012  PATIENT:  Evan [name],  62 y.o. male  PRE-OPERATIVE DIAGNOSIS:  Dehiscence Right Below Knee Amputation  POST-OPERATIVE DIAGNOSIS:  Dehiscence Right Below Knee Amputation  PROCEDURE:  Procedure(s): Revision AMPUTATION BELOW KNEE Right  SURGEON:  Surgeon(s): Nadara Mustard, MD  ANESTHESIA:   general  EBL:  min ML  SPECIMEN:  No Specimen  TOURNIQUET:  * No tourniquets in log *  PROCEDURE DETAILS: Patient is a 62 year old gentleman with severe peripheral vascular disease who presents with gangrenous changes to his right transtibial amputation. Patient has failed conservative treatment with wound care and presents at this time for revision amputation. Risks and benefits were discussed including nonhealing. Patient states he understands and wishes to proceed at this time. Description of procedure patient was brought to the operating room and underwent a general anesthetic. After adequate levels of anesthesia were obtained patient's right lower extremity was prepped using DuraPrep draped into a sterile field. A fishmouth incision was made around the necrotic gangrenous tissue this block of tissue and 2 cm of the distal tibia and fibula were resected in one block of tissue. Electrocautery was used for hemostasis. The was irrigated with normal saline. The wound is closed using 2-0 nylon. The wound is covered with Adaptic orthopedic sponges AB dressing Kerlix and Coban. Patient was extubated taken to the PACU in stable condition.  PLAN OF CARE: Discharge to skilled nursing  PATIENT DISPOSITION:  PACU - hemodynamically stable.   Nadara Mustard, MD 06/21/2012 12:15 PM

## 2012-06-21 NOTE — Preoperative (Signed)
Beta Blockers   Reason not to administer Beta Blockers:Not Applicable 

## 2012-06-21 NOTE — Anesthesia Preprocedure Evaluation (Addendum)
Anesthesia Evaluation  Patient identified by MRN, date of birth, ID band Patient awake    Reviewed: Allergy & Precautions, H&P , NPO status , Patient's Chart, lab work & pertinent test results  Airway Mallampati: II TM Distance: >3 FB Neck ROM: Full    Dental  (+) Dental Advisory Given   Pulmonary COPD COPD inhaler,  breath sounds clear to auscultation        Cardiovascular hypertension, Pt. on medications Rhythm:Regular Rate:Normal  Pt denies HTN and is not taking antihypertensive medications   Neuro/Psych Anxiety Depression    GI/Hepatic negative GI ROS, Neg liver ROS,   Endo/Other  negative endocrine ROS  Renal/GU negative Renal ROS     Musculoskeletal   Abdominal   Peds  Hematology   Anesthesia Other Findings   Reproductive/Obstetrics                          Anesthesia Physical Anesthesia Plan  ASA: III  Anesthesia Plan: General   Post-op Pain Management:    Induction: Intravenous  Airway Management Planned: LMA  Additional Equipment:   Intra-op Plan:   Post-operative Plan: Extubation in OR  Informed Consent: I have reviewed the patients History and Physical, chart, labs and discussed the procedure including the risks, benefits and alternatives for the proposed anesthesia with the patient or authorized representative who has indicated his/her understanding and acceptance.   Dental advisory given  Plan Discussed with: CRNA, Anesthesiologist and Surgeon  Anesthesia Plan Comments:         Anesthesia Quick Evaluation

## 2012-06-21 NOTE — Transfer of Care (Signed)
Immediate Anesthesia Transfer of Care Note  Patient: Evan [name]  Procedure(s) Performed: Procedure(s) with comments: Revision AMPUTATION BELOW KNEE Right (Right) - Revision right Below Knee Amputation  Patient Location: PACU  Anesthesia Type:General  Level of Consciousness: awake, alert , oriented and patient cooperative  Airway & Oxygen Therapy: Patient Spontanous Breathing and Patient connected to nasal cannula oxygen  Post-op Assessment: Report given to PACU RN, Post -op Vital signs reviewed and stable and Patient moving all extremities X 4  Post vital signs: Reviewed and stable  Complications: No apparent anesthesia complications

## 2012-06-21 NOTE — Discharge Summary (Signed)
Discharge back to skilled nursing in stable condition. Continue preoperative medications. New prescription written for Percocet for pain. Keep dressing clean dry and intact. Followup Dr. Lajoyce Corners in 2 weeks. No change with preoperative therapy for activities. Nonweightbearing right lower extremity.

## 2012-06-21 NOTE — H&P (Signed)
Evan [name] is an 62 y.o. male.   Chief Complaint: Dehiscence right transtibial amputation. HPI: Patient is a 62 year old gentleman with severe peripheral vascular disease who presents with dehiscence of the right transtibial amputation. The wound has not healed with conservative treatment and patient presents at this time for revision amputation.  Past Medical History  Diagnosis Date  . Complication of anesthesia     " sometimes I wake up during surgery "  . Anxiety   . Depression   . Cancer     lung ca    Past Surgical History  Procedure Laterality Date  . Foot amputation Right     traumatic right lower extremity  . Amputation Right 05/21/2012    Procedure: AMPUTATION BELOW KNEE;  Surgeon: Nadara Mustard, MD;  Location: [facility] OR;  Service: Orthopedics;  Laterality: Right;  . Upper jaw      SURGERY FOR INFECTION  . Colonoscopy w/ biopsies and polypectomy      No family history on file. Social History:  reports that he has been smoking Cigarettes.  He has a 22.5 pack-year smoking history. He has never used smokeless tobacco. He reports that  drinks alcohol. He reports that he does not use illicit drugs.  Allergies: No Known Allergies  Medications Prior to Admission  Medication Sig Dispense Refill  . citalopram (CELEXA) 20 MG tablet Take 20 mg by mouth daily.      . clonazePAM (KLONOPIN) 0.5 MG tablet Take 0.5 mg by mouth at bedtime as needed (sleep).      [name] oxyCODONE-acetaminophen (ROXICET) 5-325 MG per tablet Take 1 tablet by mouth every 4 (four) hours as needed for pain.  120 tablet  0    Results for orders placed during the hospital encounter of 06/19/12 (from the past 48 hour(s))  APTT     Status: None   Collection Time    06/19/12  2:20 PM      Result Value Range   aPTT 37  24 - 37 seconds   Comment:            IF BASELINE aPTT IS ELEVATED,     SUGGEST PATIENT RISK ASSESSMENT     BE USED TO DETERMINE APPROPRIATE     ANTICOAGULANT THERAPY.  CBC     Status: Abnormal    Collection Time    06/19/12  2:20 PM      Result Value Range   WBC 5.4  4.0 - 10.5 K/uL   RBC 4.30  4.22 - 5.81 MIL/uL   Hemoglobin 12.4 (*) 13.0 - 17.0 g/dL   HCT 16.1 (*) 09.6 - 04.5 %   MCV 87.2  78.0 - 100.0 fL   MCH 28.8  26.0 - 34.0 pg   MCHC 33.1  30.0 - 36.0 g/dL   RDW 40.9  81.1 - 91.4 %   Platelets 350  150 - 400 K/uL  COMPREHENSIVE METABOLIC PANEL     Status: Abnormal   Collection Time    06/19/12  2:20 PM      Result Value Range   Sodium 137  135 - 145 mEq/L   Potassium 4.2  3.5 - 5.1 mEq/L   Chloride 99  96 - 112 mEq/L   CO2 25  19 - 32 mEq/L   Glucose, Bld 94  70 - 99 mg/dL   BUN 10  6 - 23 mg/dL   Creatinine, Ser 7.82  0.50 - 1.35 mg/dL   Calcium 9.5  8.4 - 95.6 mg/dL  Total Protein 7.5  6.0 - 8.3 g/dL   Albumin 3.3 (*) 3.5 - 5.2 g/dL   AST 24  0 - 37 U/L   ALT 29  0 - 53 U/L   Alkaline Phosphatase 110  39 - 117 U/L   Total Bilirubin 0.1 (*) 0.3 - 1.2 mg/dL   GFR calc non Af Amer 88 (*) >90 mL/min   GFR calc Af Amer >90  >90 mL/min   Comment:            The eGFR has been calculated     using the CKD EPI equation.     This calculation has not been     validated in all clinical     situations.     eGFR's persistently     <90 mL/min signify     possible Chronic Kidney Disease.  PROTIME-INR     Status: None   Collection Time    06/19/12  2:20 PM      Result Value Range   Prothrombin Time 12.6  11.6 - 15.2 seconds   INR 0.95  0.00 - 1.49  SURGICAL PCR SCREEN     Status: Abnormal   Collection Time    06/19/12  2:28 PM      Result Value Range   MRSA, PCR NEGATIVE  NEGATIVE   Staphylococcus aureus POSITIVE (*) NEGATIVE   Comment:            The Xpert SA Assay (FDA     approved for NASAL specimens     in patients over 2 years of age),     is one component of     a comprehensive surveillance     program.  Test performance has     been validated by [employer] for patients greater     than or equal to 18 year old.     It is not intended      to diagnose infection nor to     guide or monitor treatment.   No results found.  Review of Systems  All other systems reviewed and are negative.    Blood pressure 151/98, pulse 63, temperature 98.2 F (36.8 C), temperature source Oral, resp. rate 20, SpO2 96.00%. Physical Exam  On examination there is complete dehiscence of the surgical transtibial amputation wound there is necrotic tissue. Assessment/Plan Assessment: Dehiscence right transtibial amputation.  Plan: We'll plan for revision amputation. Risks and benefits were discussed including a higher level amputation. Patient states he understands was to proceed at this time.  Charda Janis V 06/21/2012, 9:47 AM

## 2012-06-21 NOTE — Anesthesia Postprocedure Evaluation (Signed)
  Anesthesia Post-op Note  Patient: Evan [name]  Procedure(s) Performed: Procedure(s) with comments: Revision AMPUTATION BELOW KNEE Right (Right) - Revision right Below Knee Amputation  Patient Location: PACU  Anesthesia Type:General  Level of Consciousness: awake  Airway and Oxygen Therapy: Patient Spontanous Breathing  Post-op Pain: mild  Post-op Assessment: Post-op Vital signs reviewed  Post-op Vital Signs: Reviewed  Complications: No apparent anesthesia complications

## 2012-06-21 NOTE — Anesthesia Procedure Notes (Signed)
Procedure Name: LMA Insertion Date/Time: 06/21/2012 11:33 AM Performed by: Rogelia Boga Pre-anesthesia Checklist: Patient identified, Emergency Drugs available, Suction available, Patient being monitored and Timeout performed Patient Re-evaluated:Patient Re-evaluated prior to inductionOxygen Delivery Method: Circle system utilized Preoxygenation: Pre-oxygenation with 100% oxygen Intubation Type: IV induction LMA: LMA inserted LMA Size: 4.0 Number of attempts: 1 Placement Confirmation: positive ETCO2 and breath sounds checked- equal and bilateral Tube secured with: Tape Dental Injury: Teeth and Oropharynx as per pre-operative assessment

## 2012-06-21 NOTE — Progress Notes (Signed)
Report given to sylvia hunt nurse at [facility] called ([facility]) will be here appro 15 minutes to pick up pt

## 2012-06-21 NOTE — Progress Notes (Signed)
Report given to teresa rn as caregiver 

## 2012-06-22 ENCOUNTER — Encounter (HOSPITAL_COMMUNITY): Payer: Self-pay | Admitting: Orthopedic Surgery

## 2012-06-28 ENCOUNTER — Non-Acute Institutional Stay (SKILLED_NURSING_FACILITY): Payer: Medicare Other | Admitting: Adult Health

## 2012-06-28 DIAGNOSIS — F329 Major depressive disorder, single episode, unspecified: Secondary | ICD-10-CM | POA: Diagnosis not present

## 2012-06-28 DIAGNOSIS — Z5189 Encounter for other specified aftercare: Secondary | ICD-10-CM | POA: Diagnosis not present

## 2012-06-28 DIAGNOSIS — I10 Essential (primary) hypertension: Secondary | ICD-10-CM | POA: Diagnosis not present

## 2012-06-28 DIAGNOSIS — S98911D Complete traumatic amputation of right foot, level unspecified, subsequent encounter: Secondary | ICD-10-CM

## 2012-07-04 ENCOUNTER — Encounter: Payer: Self-pay | Admitting: Adult Health

## 2012-07-04 DIAGNOSIS — R269 Unspecified abnormalities of gait and mobility: Secondary | ICD-10-CM | POA: Diagnosis not present

## 2012-07-04 DIAGNOSIS — I10 Essential (primary) hypertension: Secondary | ICD-10-CM | POA: Diagnosis not present

## 2012-07-04 DIAGNOSIS — Z4789 Encounter for other orthopedic aftercare: Secondary | ICD-10-CM | POA: Diagnosis not present

## 2012-07-04 DIAGNOSIS — S98911A Complete traumatic amputation of right foot, level unspecified, initial encounter: Secondary | ICD-10-CM | POA: Insufficient documentation

## 2012-07-04 DIAGNOSIS — S98919A Complete traumatic amputation of unspecified foot, level unspecified, initial encounter: Secondary | ICD-10-CM | POA: Diagnosis not present

## 2012-07-04 DIAGNOSIS — I739 Peripheral vascular disease, unspecified: Secondary | ICD-10-CM | POA: Diagnosis not present

## 2012-07-04 NOTE — Progress Notes (Signed)
  Subjective:    Patient ID: Evan [name], male    DOB: May 25, 1950, 62 y.o.   MRN: [id number]  HPI This is a 62 year old male who is for discharge home with Home health Nursing, PT, OT and Social Work. DME: Rolling walker and lightweight wheelchair to assist with ADLs. He has been admitted to [facility] on 05/24/12 from [facility] with traumatic right foot amputation and right distal radius intra-articular fracture nondisplaced S/P right transtibial amputation and closed treatment for right distal radius fracture. He had dehiscence of right BKA and had revision amputation of right BKA last 06/21/12. He had been admitted for a short-term rehabilitation.  Review of Systems  Constitutional: Negative.   HENT: Negative.   Eyes: Negative.   Respiratory: Negative for cough and shortness of breath.   Cardiovascular: Negative.   Gastrointestinal: Negative.   Endocrine: Negative.   Genitourinary: Negative.   Neurological: Negative.   Hematological: Negative for adenopathy. Does not bruise/bleed easily.  Psychiatric/Behavioral: Negative.        Objective:   Physical Exam  Nursing note and vitals reviewed. Constitutional: He is oriented to person, place, and time. He appears well-developed and well-nourished.  HENT:  Head: Normocephalic and atraumatic.  Right Ear: External ear normal.  Left Ear: External ear normal.  Eyes: Conjunctivae are normal. Pupils are equal, round, and reactive to light.  Neck: Normal range of motion. Neck supple.  Cardiovascular: Normal rate, regular rhythm and normal heart sounds.   Pulmonary/Chest: Effort normal and breath sounds normal.  Abdominal: Soft. Bowel sounds are normal.  Musculoskeletal: He exhibits no edema and no tenderness.  R BKA stump covered with ace wrap dressing, dry  Neurological: He is alert and oriented to person, place, and time.  Skin: Skin is warm and dry.  Psychiatric: He has a normal mood and affect. His behavior is normal.  Judgment and thought content normal.   LABS: 05/31/12  Wbc 6.1  hgb 11.0  hct 33.9  Bmp nl   Medications reviewed per [facility]    Assessment & Plan:   Traumatic amputation of right foot S/P right transtibial amputation S/P Revision amputation of right BKA  - will have Home health Nursing, PT, OT and Social Worker   DEPRESSION - stable; continue Celexa 20 mg 1 tab PO Q D  HYPERTENSION - well-controlled without any medication

## 2012-07-06 DIAGNOSIS — Z4789 Encounter for other orthopedic aftercare: Secondary | ICD-10-CM | POA: Diagnosis not present

## 2012-07-06 DIAGNOSIS — I10 Essential (primary) hypertension: Secondary | ICD-10-CM | POA: Diagnosis not present

## 2012-07-06 DIAGNOSIS — I739 Peripheral vascular disease, unspecified: Secondary | ICD-10-CM | POA: Diagnosis not present

## 2012-07-06 DIAGNOSIS — S98919A Complete traumatic amputation of unspecified foot, level unspecified, initial encounter: Secondary | ICD-10-CM | POA: Diagnosis not present

## 2012-07-06 DIAGNOSIS — R269 Unspecified abnormalities of gait and mobility: Secondary | ICD-10-CM | POA: Diagnosis not present

## 2012-07-09 DIAGNOSIS — I739 Peripheral vascular disease, unspecified: Secondary | ICD-10-CM | POA: Diagnosis not present

## 2012-07-09 DIAGNOSIS — I10 Essential (primary) hypertension: Secondary | ICD-10-CM | POA: Diagnosis not present

## 2012-07-09 DIAGNOSIS — S98919A Complete traumatic amputation of unspecified foot, level unspecified, initial encounter: Secondary | ICD-10-CM | POA: Diagnosis not present

## 2012-07-09 DIAGNOSIS — Z4789 Encounter for other orthopedic aftercare: Secondary | ICD-10-CM | POA: Diagnosis not present

## 2012-07-09 DIAGNOSIS — R269 Unspecified abnormalities of gait and mobility: Secondary | ICD-10-CM | POA: Diagnosis not present

## 2012-07-11 DIAGNOSIS — Z4789 Encounter for other orthopedic aftercare: Secondary | ICD-10-CM | POA: Diagnosis not present

## 2012-07-11 DIAGNOSIS — I739 Peripheral vascular disease, unspecified: Secondary | ICD-10-CM | POA: Diagnosis not present

## 2012-07-11 DIAGNOSIS — R269 Unspecified abnormalities of gait and mobility: Secondary | ICD-10-CM | POA: Diagnosis not present

## 2012-07-11 DIAGNOSIS — S98919A Complete traumatic amputation of unspecified foot, level unspecified, initial encounter: Secondary | ICD-10-CM | POA: Diagnosis not present

## 2012-07-11 DIAGNOSIS — I10 Essential (primary) hypertension: Secondary | ICD-10-CM | POA: Diagnosis not present

## 2012-07-12 DIAGNOSIS — S98919A Complete traumatic amputation of unspecified foot, level unspecified, initial encounter: Secondary | ICD-10-CM | POA: Diagnosis not present

## 2012-07-12 DIAGNOSIS — I739 Peripheral vascular disease, unspecified: Secondary | ICD-10-CM | POA: Diagnosis not present

## 2012-07-12 DIAGNOSIS — Z4789 Encounter for other orthopedic aftercare: Secondary | ICD-10-CM | POA: Diagnosis not present

## 2012-07-12 DIAGNOSIS — I10 Essential (primary) hypertension: Secondary | ICD-10-CM | POA: Diagnosis not present

## 2012-07-12 DIAGNOSIS — R269 Unspecified abnormalities of gait and mobility: Secondary | ICD-10-CM | POA: Diagnosis not present

## 2012-07-13 DIAGNOSIS — I10 Essential (primary) hypertension: Secondary | ICD-10-CM | POA: Diagnosis not present

## 2012-07-13 DIAGNOSIS — S98919A Complete traumatic amputation of unspecified foot, level unspecified, initial encounter: Secondary | ICD-10-CM | POA: Diagnosis not present

## 2012-07-13 DIAGNOSIS — R269 Unspecified abnormalities of gait and mobility: Secondary | ICD-10-CM | POA: Diagnosis not present

## 2012-07-13 DIAGNOSIS — I739 Peripheral vascular disease, unspecified: Secondary | ICD-10-CM | POA: Diagnosis not present

## 2012-07-13 DIAGNOSIS — Z4789 Encounter for other orthopedic aftercare: Secondary | ICD-10-CM | POA: Diagnosis not present

## 2012-07-15 DIAGNOSIS — Z4789 Encounter for other orthopedic aftercare: Secondary | ICD-10-CM | POA: Diagnosis not present

## 2012-07-15 DIAGNOSIS — R269 Unspecified abnormalities of gait and mobility: Secondary | ICD-10-CM | POA: Diagnosis not present

## 2012-07-15 DIAGNOSIS — I739 Peripheral vascular disease, unspecified: Secondary | ICD-10-CM | POA: Diagnosis not present

## 2012-07-15 DIAGNOSIS — S98919A Complete traumatic amputation of unspecified foot, level unspecified, initial encounter: Secondary | ICD-10-CM | POA: Diagnosis not present

## 2012-07-15 DIAGNOSIS — I10 Essential (primary) hypertension: Secondary | ICD-10-CM | POA: Diagnosis not present

## 2012-07-17 DIAGNOSIS — S98919A Complete traumatic amputation of unspecified foot, level unspecified, initial encounter: Secondary | ICD-10-CM | POA: Diagnosis not present

## 2012-07-17 DIAGNOSIS — R269 Unspecified abnormalities of gait and mobility: Secondary | ICD-10-CM | POA: Diagnosis not present

## 2012-07-17 DIAGNOSIS — I739 Peripheral vascular disease, unspecified: Secondary | ICD-10-CM | POA: Diagnosis not present

## 2012-07-17 DIAGNOSIS — I10 Essential (primary) hypertension: Secondary | ICD-10-CM | POA: Diagnosis not present

## 2012-07-17 DIAGNOSIS — Z4789 Encounter for other orthopedic aftercare: Secondary | ICD-10-CM | POA: Diagnosis not present

## 2012-07-18 DIAGNOSIS — S98919A Complete traumatic amputation of unspecified foot, level unspecified, initial encounter: Secondary | ICD-10-CM | POA: Diagnosis not present

## 2012-07-18 DIAGNOSIS — I10 Essential (primary) hypertension: Secondary | ICD-10-CM | POA: Diagnosis not present

## 2012-07-18 DIAGNOSIS — I739 Peripheral vascular disease, unspecified: Secondary | ICD-10-CM | POA: Diagnosis not present

## 2012-07-18 DIAGNOSIS — R269 Unspecified abnormalities of gait and mobility: Secondary | ICD-10-CM | POA: Diagnosis not present

## 2012-07-18 DIAGNOSIS — Z4789 Encounter for other orthopedic aftercare: Secondary | ICD-10-CM | POA: Diagnosis not present

## 2012-07-19 ENCOUNTER — Other Ambulatory Visit (HOSPITAL_BASED_OUTPATIENT_CLINIC_OR_DEPARTMENT_OTHER): Payer: Medicare Other

## 2012-07-19 ENCOUNTER — Ambulatory Visit (HOSPITAL_COMMUNITY)
Admission: RE | Admit: 2012-07-19 | Discharge: 2012-07-19 | Disposition: A | Discharge status: Home / Self Care | Payer: Medicare Other | Source: Ambulatory Visit | Attending: Internal Medicine | Admitting: Internal Medicine

## 2012-07-19 DIAGNOSIS — C3410 Malignant neoplasm of upper lobe, unspecified bronchus or lung: Secondary | ICD-10-CM | POA: Insufficient documentation

## 2012-07-19 DIAGNOSIS — C3490 Malignant neoplasm of unspecified part of unspecified bronchus or lung: Secondary | ICD-10-CM | POA: Diagnosis not present

## 2012-07-19 LAB — CBC WITH DIFFERENTIAL/PLATELET
Basophils Absolute: 0.1 10*3/uL (ref 0.0–0.1)
Eosinophils Absolute: 0.1 10*3/uL (ref 0.0–0.5)
HCT: 36.7 % — ABNORMAL LOW (ref 38.4–49.9)
HGB: 12.2 g/dL — ABNORMAL LOW (ref 13.0–17.1)
LYMPH%: 19.1 % (ref 14.0–49.0)
MCV: 88.8 fL (ref 79.3–98.0)
MONO#: 0.4 10*3/uL (ref 0.1–0.9)
NEUT#: 3.4 10*3/uL (ref 1.5–6.5)
Platelets: 453 10*3/uL — ABNORMAL HIGH (ref 140–400)
RBC: 4.13 10*6/uL — ABNORMAL LOW (ref 4.20–5.82)
WBC: 4.9 10*3/uL (ref 4.0–10.3)

## 2012-07-19 LAB — COMPREHENSIVE METABOLIC PANEL (CC13)
Alkaline Phosphatase: 109 U/L (ref 40–150)
BUN: 7.8 mg/dL (ref 7.0–26.0)
CO2: 25 mEq/L (ref 22–29)
Creatinine: 0.9 mg/dL (ref 0.7–1.3)
Glucose: 97 mg/dl (ref 70–99)
Total Bilirubin: 0.26 mg/dL (ref 0.20–1.20)

## 2012-07-19 MED ORDER — IOHEXOL 300 MG/ML  SOLN
80.0000 mL | Freq: Once | INTRAMUSCULAR | Status: AC | PRN
  Administered 2012-07-19: 80 mL via INTRAVENOUS

## 2012-07-20 DIAGNOSIS — S98919A Complete traumatic amputation of unspecified foot, level unspecified, initial encounter: Secondary | ICD-10-CM | POA: Diagnosis not present

## 2012-07-20 DIAGNOSIS — Z4789 Encounter for other orthopedic aftercare: Secondary | ICD-10-CM | POA: Diagnosis not present

## 2012-07-20 DIAGNOSIS — I739 Peripheral vascular disease, unspecified: Secondary | ICD-10-CM | POA: Diagnosis not present

## 2012-07-20 DIAGNOSIS — I10 Essential (primary) hypertension: Secondary | ICD-10-CM | POA: Diagnosis not present

## 2012-07-20 DIAGNOSIS — R269 Unspecified abnormalities of gait and mobility: Secondary | ICD-10-CM | POA: Diagnosis not present

## 2012-07-24 ENCOUNTER — Ambulatory Visit (HOSPITAL_BASED_OUTPATIENT_CLINIC_OR_DEPARTMENT_OTHER): Payer: Medicare Other | Admitting: Internal Medicine

## 2012-07-24 ENCOUNTER — Encounter: Payer: Self-pay | Admitting: Internal Medicine

## 2012-07-24 ENCOUNTER — Telehealth: Payer: Self-pay | Admitting: Internal Medicine

## 2012-07-24 DIAGNOSIS — Z4789 Encounter for other orthopedic aftercare: Secondary | ICD-10-CM | POA: Diagnosis not present

## 2012-07-24 DIAGNOSIS — S98919A Complete traumatic amputation of unspecified foot, level unspecified, initial encounter: Secondary | ICD-10-CM | POA: Diagnosis not present

## 2012-07-24 DIAGNOSIS — I739 Peripheral vascular disease, unspecified: Secondary | ICD-10-CM | POA: Diagnosis not present

## 2012-07-24 DIAGNOSIS — C3410 Malignant neoplasm of upper lobe, unspecified bronchus or lung: Secondary | ICD-10-CM | POA: Diagnosis not present

## 2012-07-24 DIAGNOSIS — I10 Essential (primary) hypertension: Secondary | ICD-10-CM | POA: Diagnosis not present

## 2012-07-24 DIAGNOSIS — R269 Unspecified abnormalities of gait and mobility: Secondary | ICD-10-CM | POA: Diagnosis not present
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
## 2012-07-24 NOTE — Telephone Encounter (Signed)
gv and printed appt sched and avs for pt....pt awre cs will contact to sched ct.Marland KitchenMarland Kitchen

## 2012-07-24 NOTE — Progress Notes (Signed)
Still smoking " off and on"

## 2012-07-24 NOTE — Patient Instructions (Signed)
No evidence for disease progression on his recent scan.  Followup visit in 6 months.

## 2012-07-25 DIAGNOSIS — I10 Essential (primary) hypertension: Secondary | ICD-10-CM | POA: Diagnosis not present

## 2012-07-25 DIAGNOSIS — R269 Unspecified abnormalities of gait and mobility: Secondary | ICD-10-CM | POA: Diagnosis not present

## 2012-07-25 DIAGNOSIS — S98919A Complete traumatic amputation of unspecified foot, level unspecified, initial encounter: Secondary | ICD-10-CM | POA: Diagnosis not present

## 2012-07-25 DIAGNOSIS — I739 Peripheral vascular disease, unspecified: Secondary | ICD-10-CM | POA: Diagnosis not present

## 2012-07-25 DIAGNOSIS — Z4789 Encounter for other orthopedic aftercare: Secondary | ICD-10-CM | POA: Diagnosis not present

## 2012-07-27 DIAGNOSIS — S98919A Complete traumatic amputation of unspecified foot, level unspecified, initial encounter: Secondary | ICD-10-CM | POA: Diagnosis not present

## 2012-07-27 DIAGNOSIS — R269 Unspecified abnormalities of gait and mobility: Secondary | ICD-10-CM | POA: Diagnosis not present

## 2012-07-27 DIAGNOSIS — I10 Essential (primary) hypertension: Secondary | ICD-10-CM | POA: Diagnosis not present

## 2012-07-27 DIAGNOSIS — Z4789 Encounter for other orthopedic aftercare: Secondary | ICD-10-CM | POA: Diagnosis not present

## 2012-07-27 DIAGNOSIS — I739 Peripheral vascular disease, unspecified: Secondary | ICD-10-CM | POA: Diagnosis not present

## 2012-07-31 DIAGNOSIS — R269 Unspecified abnormalities of gait and mobility: Secondary | ICD-10-CM | POA: Diagnosis not present

## 2012-07-31 DIAGNOSIS — Z4789 Encounter for other orthopedic aftercare: Secondary | ICD-10-CM | POA: Diagnosis not present

## 2012-07-31 DIAGNOSIS — I10 Essential (primary) hypertension: Secondary | ICD-10-CM | POA: Diagnosis not present

## 2012-07-31 DIAGNOSIS — S98919A Complete traumatic amputation of unspecified foot, level unspecified, initial encounter: Secondary | ICD-10-CM | POA: Diagnosis not present

## 2012-07-31 DIAGNOSIS — I739 Peripheral vascular disease, unspecified: Secondary | ICD-10-CM | POA: Diagnosis not present

## 2012-08-01 DIAGNOSIS — Z4789 Encounter for other orthopedic aftercare: Secondary | ICD-10-CM | POA: Diagnosis not present

## 2012-08-01 DIAGNOSIS — R269 Unspecified abnormalities of gait and mobility: Secondary | ICD-10-CM | POA: Diagnosis not present

## 2012-08-01 DIAGNOSIS — S98919A Complete traumatic amputation of unspecified foot, level unspecified, initial encounter: Secondary | ICD-10-CM | POA: Diagnosis not present

## 2012-08-01 DIAGNOSIS — I10 Essential (primary) hypertension: Secondary | ICD-10-CM | POA: Diagnosis not present

## 2012-08-01 DIAGNOSIS — I739 Peripheral vascular disease, unspecified: Secondary | ICD-10-CM | POA: Diagnosis not present

## 2012-08-02 DIAGNOSIS — R269 Unspecified abnormalities of gait and mobility: Secondary | ICD-10-CM | POA: Diagnosis not present

## 2012-08-02 DIAGNOSIS — I10 Essential (primary) hypertension: Secondary | ICD-10-CM | POA: Diagnosis not present

## 2012-08-02 DIAGNOSIS — Z4789 Encounter for other orthopedic aftercare: Secondary | ICD-10-CM | POA: Diagnosis not present

## 2012-08-02 DIAGNOSIS — I739 Peripheral vascular disease, unspecified: Secondary | ICD-10-CM | POA: Diagnosis not present

## 2012-08-02 DIAGNOSIS — S98919A Complete traumatic amputation of unspecified foot, level unspecified, initial encounter: Secondary | ICD-10-CM | POA: Diagnosis not present

## 2012-08-07 DIAGNOSIS — R269 Unspecified abnormalities of gait and mobility: Secondary | ICD-10-CM | POA: Diagnosis not present

## 2012-08-07 DIAGNOSIS — S98919A Complete traumatic amputation of unspecified foot, level unspecified, initial encounter: Secondary | ICD-10-CM | POA: Diagnosis not present

## 2012-08-07 DIAGNOSIS — Z4789 Encounter for other orthopedic aftercare: Secondary | ICD-10-CM | POA: Diagnosis not present

## 2012-08-07 DIAGNOSIS — I10 Essential (primary) hypertension: Secondary | ICD-10-CM | POA: Diagnosis not present

## 2012-08-07 DIAGNOSIS — I739 Peripheral vascular disease, unspecified: Secondary | ICD-10-CM | POA: Diagnosis not present

## 2012-08-09 DIAGNOSIS — Z4789 Encounter for other orthopedic aftercare: Secondary | ICD-10-CM | POA: Diagnosis not present

## 2012-08-09 DIAGNOSIS — I10 Essential (primary) hypertension: Secondary | ICD-10-CM | POA: Diagnosis not present

## 2012-08-09 DIAGNOSIS — R269 Unspecified abnormalities of gait and mobility: Secondary | ICD-10-CM | POA: Diagnosis not present

## 2012-08-09 DIAGNOSIS — S98919A Complete traumatic amputation of unspecified foot, level unspecified, initial encounter: Secondary | ICD-10-CM | POA: Diagnosis not present

## 2012-08-09 DIAGNOSIS — I739 Peripheral vascular disease, unspecified: Secondary | ICD-10-CM | POA: Diagnosis not present

## 2012-08-14 ENCOUNTER — Telehealth: Payer: Self-pay | Admitting: Dietician

## 2012-08-14 NOTE — Telephone Encounter (Signed)
Brief Outpatient Oncology Nutrition Note  Patient has been identified to be at risk on malnutrition screen.  Wt Readings from Last 10 Encounters:  07/24/12 125 lb 12.8 oz (57.063 kg)  06/28/12 129 lb 6.4 oz (58.695 kg)  06/19/12 132 lb 12.8 oz (60.238 kg)  05/21/12 140 lb (63.504 kg)  05/21/12 140 lb (63.504 kg)  03/20/12 150 lb (68.04 kg)  01/20/12 144 lb 4.8 oz (65.454 kg)  04/20/11 143 lb 12.8 oz (65.227 kg)  03/25/09 141 lb 8 oz (64.184 kg)  05/15/08 128 lb 8 oz (58.287 kg)    Called patient with hx of Stage IIIA non small cell lung cancer, adenocarcinoma.  Patient s/p tramatic amputation of foot April 2014 which resulted in Right BKA.  IBW is 126 lbs.  Patient is at IBW currently.  Patient unavailable.  [facility] contact information.  Oran Rein, RD, LDN

## 2012-11-13 ENCOUNTER — Ambulatory Visit: Payer: Medicare Other | Attending: Orthopedic Surgery | Admitting: Physical Therapy

## 2012-11-13 DIAGNOSIS — R5381 Other malaise: Secondary | ICD-10-CM | POA: Insufficient documentation

## 2012-11-13 DIAGNOSIS — Z4789 Encounter for other orthopedic aftercare: Secondary | ICD-10-CM | POA: Diagnosis not present

## 2012-11-13 DIAGNOSIS — R269 Unspecified abnormalities of gait and mobility: Secondary | ICD-10-CM | POA: Diagnosis not present

## 2012-11-13 DIAGNOSIS — M2550 Pain in unspecified joint: Secondary | ICD-10-CM | POA: Insufficient documentation

## 2012-11-20 ENCOUNTER — Ambulatory Visit: Payer: Medicare Other | Admitting: Physical Therapy

## 2012-11-20 DIAGNOSIS — R5381 Other malaise: Secondary | ICD-10-CM | POA: Diagnosis not present

## 2012-11-20 DIAGNOSIS — R269 Unspecified abnormalities of gait and mobility: Secondary | ICD-10-CM | POA: Diagnosis not present

## 2012-11-20 DIAGNOSIS — M2550 Pain in unspecified joint: Secondary | ICD-10-CM | POA: Diagnosis not present

## 2012-11-20 DIAGNOSIS — Z4789 Encounter for other orthopedic aftercare: Secondary | ICD-10-CM | POA: Diagnosis not present

## 2012-11-22 ENCOUNTER — Ambulatory Visit: Payer: Medicare Other | Admitting: Physical Therapy

## 2012-11-22 DIAGNOSIS — M2550 Pain in unspecified joint: Secondary | ICD-10-CM | POA: Diagnosis not present

## 2012-11-22 DIAGNOSIS — Z4789 Encounter for other orthopedic aftercare: Secondary | ICD-10-CM | POA: Diagnosis not present

## 2012-11-22 DIAGNOSIS — R5381 Other malaise: Secondary | ICD-10-CM | POA: Diagnosis not present

## 2012-11-22 DIAGNOSIS — R269 Unspecified abnormalities of gait and mobility: Secondary | ICD-10-CM | POA: Diagnosis not present

## 2012-11-27 ENCOUNTER — Ambulatory Visit: Payer: Medicare Other | Admitting: Physical Therapy

## 2012-11-27 DIAGNOSIS — R5381 Other malaise: Secondary | ICD-10-CM | POA: Diagnosis not present

## 2012-11-27 DIAGNOSIS — M2550 Pain in unspecified joint: Secondary | ICD-10-CM | POA: Diagnosis not present

## 2012-11-27 DIAGNOSIS — Z4789 Encounter for other orthopedic aftercare: Secondary | ICD-10-CM | POA: Diagnosis not present

## 2012-11-27 DIAGNOSIS — R269 Unspecified abnormalities of gait and mobility: Secondary | ICD-10-CM | POA: Diagnosis not present

## 2012-11-29 ENCOUNTER — Ambulatory Visit: Payer: Medicare Other | Admitting: Physical Therapy

## 2012-11-29 DIAGNOSIS — R269 Unspecified abnormalities of gait and mobility: Secondary | ICD-10-CM | POA: Diagnosis not present

## 2012-11-29 DIAGNOSIS — Z4789 Encounter for other orthopedic aftercare: Secondary | ICD-10-CM | POA: Diagnosis not present

## 2012-11-29 DIAGNOSIS — M2550 Pain in unspecified joint: Secondary | ICD-10-CM | POA: Diagnosis not present

## 2012-11-29 DIAGNOSIS — R5381 Other malaise: Secondary | ICD-10-CM | POA: Diagnosis not present

## 2012-11-30 DIAGNOSIS — S88911A Complete traumatic amputation of right lower leg, level unspecified, initial encounter: Secondary | ICD-10-CM | POA: Diagnosis not present

## 2012-11-30 DIAGNOSIS — S52539A Colles' fracture of unspecified radius, initial encounter for closed fracture: Secondary | ICD-10-CM | POA: Diagnosis not present

## 2012-12-04 ENCOUNTER — Ambulatory Visit: Payer: Medicare Other | Attending: Orthopedic Surgery | Admitting: Physical Therapy

## 2012-12-04 DIAGNOSIS — R269 Unspecified abnormalities of gait and mobility: Secondary | ICD-10-CM | POA: Insufficient documentation

## 2012-12-04 DIAGNOSIS — M2550 Pain in unspecified joint: Secondary | ICD-10-CM | POA: Insufficient documentation

## 2012-12-04 DIAGNOSIS — R5381 Other malaise: Secondary | ICD-10-CM | POA: Diagnosis not present

## 2012-12-04 DIAGNOSIS — Z4789 Encounter for other orthopedic aftercare: Secondary | ICD-10-CM | POA: Diagnosis not present

## 2012-12-06 ENCOUNTER — Ambulatory Visit: Payer: Medicare Other | Admitting: Physical Therapy

## 2012-12-11 ENCOUNTER — Ambulatory Visit: Payer: Medicare Other | Admitting: Physical Therapy

## 2012-12-13 ENCOUNTER — Ambulatory Visit: Payer: Medicare Other | Admitting: Physical Therapy

## 2012-12-18 ENCOUNTER — Ambulatory Visit: Payer: Medicare Other | Admitting: Physical Therapy

## 2012-12-20 ENCOUNTER — Ambulatory Visit: Payer: Medicare Other | Admitting: Physical Therapy

## 2012-12-25 ENCOUNTER — Ambulatory Visit: Payer: Medicare Other | Admitting: Physical Therapy

## 2012-12-27 ENCOUNTER — Ambulatory Visit: Payer: Medicare Other | Admitting: Physical Therapy

## 2013-01-01 ENCOUNTER — Ambulatory Visit: Payer: Medicare Other | Attending: Orthopedic Surgery | Admitting: Physical Therapy

## 2013-01-01 DIAGNOSIS — Z4789 Encounter for other orthopedic aftercare: Secondary | ICD-10-CM | POA: Insufficient documentation

## 2013-01-01 DIAGNOSIS — M2550 Pain in unspecified joint: Secondary | ICD-10-CM | POA: Insufficient documentation

## 2013-01-01 DIAGNOSIS — R5381 Other malaise: Secondary | ICD-10-CM | POA: Diagnosis not present

## 2013-01-01 DIAGNOSIS — R269 Unspecified abnormalities of gait and mobility: Secondary | ICD-10-CM | POA: Insufficient documentation

## 2013-01-03 ENCOUNTER — Ambulatory Visit: Payer: Medicare Other | Admitting: Physical Therapy

## 2013-01-08 ENCOUNTER — Ambulatory Visit: Payer: Medicare Other | Admitting: Physical Therapy

## 2013-01-10 ENCOUNTER — Ambulatory Visit: Payer: Medicare Other | Admitting: Physical Therapy

## 2013-01-15 ENCOUNTER — Ambulatory Visit (HOSPITAL_COMMUNITY)
Admission: RE | Admit: 2013-01-15 | Discharge: 2013-01-15 | Disposition: A | Discharge status: Home / Self Care | Payer: Medicare Other | Source: Ambulatory Visit | Attending: Internal Medicine | Admitting: Internal Medicine

## 2013-01-15 ENCOUNTER — Encounter (HOSPITAL_COMMUNITY): Payer: Self-pay

## 2013-01-15 ENCOUNTER — Encounter: Payer: Medicare Other | Admitting: Physical Therapy

## 2013-01-15 ENCOUNTER — Other Ambulatory Visit (HOSPITAL_BASED_OUTPATIENT_CLINIC_OR_DEPARTMENT_OTHER): Payer: Medicare Other

## 2013-01-15 DIAGNOSIS — R918 Other nonspecific abnormal finding of lung field: Secondary | ICD-10-CM | POA: Diagnosis not present

## 2013-01-15 DIAGNOSIS — Z9221 Personal history of antineoplastic chemotherapy: Secondary | ICD-10-CM | POA: Insufficient documentation

## 2013-01-15 DIAGNOSIS — Z923 Personal history of irradiation: Secondary | ICD-10-CM | POA: Insufficient documentation

## 2013-01-15 DIAGNOSIS — Z85118 Personal history of other malignant neoplasm of bronchus and lung: Secondary | ICD-10-CM | POA: Insufficient documentation

## 2013-01-15 DIAGNOSIS — J479 Bronchiectasis, uncomplicated: Secondary | ICD-10-CM | POA: Insufficient documentation

## 2013-01-15 DIAGNOSIS — C3410 Malignant neoplasm of upper lobe, unspecified bronchus or lung: Secondary | ICD-10-CM

## 2013-01-15 DIAGNOSIS — J701 Chronic and other pulmonary manifestations due to radiation: Secondary | ICD-10-CM | POA: Diagnosis not present

## 2013-01-15 DIAGNOSIS — Y842 Radiological procedure and radiotherapy as the cause of abnormal reaction of the patient, or of later complication, without mention of misadventure at the time of the procedure: Secondary | ICD-10-CM | POA: Insufficient documentation

## 2013-01-15 LAB — COMPREHENSIVE METABOLIC PANEL (CC13)
Alkaline Phosphatase: 105 U/L (ref 40–150)
Anion Gap: 9 mEq/L (ref 3–11)
CO2: 26 mEq/L (ref 22–29)
Creatinine: 0.8 mg/dL (ref 0.7–1.3)
Glucose: 102 mg/dl (ref 70–140)
Total Bilirubin: 0.35 mg/dL (ref 0.20–1.20)

## 2013-01-15 LAB — CBC WITH DIFFERENTIAL/PLATELET
BASO%: 0.7 % (ref 0.0–2.0)
Eosinophils Absolute: 0.2 10*3/uL (ref 0.0–0.5)
HCT: 45.1 % (ref 38.4–49.9)
LYMPH%: 15.7 % (ref 14.0–49.0)
MCHC: 33 g/dL (ref 32.0–36.0)
MCV: 90.8 fL (ref 79.3–98.0)
MONO#: 0.5 10*3/uL (ref 0.1–0.9)
MONO%: 5.2 % (ref 0.0–14.0)
NEUT%: 75.8 % — ABNORMAL HIGH (ref 39.0–75.0)
Platelets: 334 10*3/uL (ref 140–400)
WBC: 8.8 10*3/uL (ref 4.0–10.3)

## 2013-01-15 MED ORDER — IOHEXOL 300 MG/ML  SOLN
80.0000 mL | Freq: Once | INTRAMUSCULAR | Status: AC | PRN
  Administered 2013-01-15: 80 mL via INTRAVENOUS

## 2013-01-16 ENCOUNTER — Ambulatory Visit: Payer: Medicare Other | Admitting: Physical Therapy

## 2013-01-18 ENCOUNTER — Ambulatory Visit: Payer: Medicare Other | Admitting: Physical Therapy

## 2013-01-18 ENCOUNTER — Encounter: Payer: Medicare Other | Admitting: Physical Therapy

## 2013-01-22 ENCOUNTER — Ambulatory Visit (HOSPITAL_BASED_OUTPATIENT_CLINIC_OR_DEPARTMENT_OTHER): Payer: Medicare Other | Admitting: Internal Medicine

## 2013-01-22 ENCOUNTER — Encounter: Payer: Self-pay | Admitting: Internal Medicine

## 2013-01-22 DIAGNOSIS — C3410 Malignant neoplasm of upper lobe, unspecified bronchus or lung: Secondary | ICD-10-CM | POA: Diagnosis not present

## 2013-01-22 NOTE — Patient Instructions (Signed)
Followup visit in 6 months with repeat CT scan of the chest. 

## 2013-01-22 NOTE — Progress Notes (Signed)
[facility] Telephone:([phone]   Fax:([phone]  OFFICE PROGRESS NOTE  [name], MD [address] [place] [address]  PRINCIPAL DIAGNOSIS: Stage IIIA (T2 N2 Mx) non-small cell lung cancer, adenocarcinoma, diagnosed in June 2010.   PRIOR THERAPY:  1. Status post concurrent chemoradiation with weekly carboplatin and paclitaxel; last dose was given April 08, 2008. 2. Status post 3 cycles of consolidation chemotherapy with docetaxel; last dose was given on Jun 26, 2008.  CURRENT THERAPY: Observation.  INTERVAL HISTORY: [name] 62 y.o. male returns to the clinic today for followup visit accompanied his daughter. 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 He is currently undergoing rehabilitation. He denied having any significant chest pain or shortness of breath. He denied having any cough or hemoptysis. He denied having any significant weight loss or night sweats. The patient has no nausea or vomiting. The patient has repeat CT scan of the chest performed recently and he is here for evaluation and discussion of his scan results.  MEDICAL HISTORY: Past Medical History  Diagnosis Date  . Complication of anesthesia     " sometimes I wake up during surgery "  . Anxiety   . Depression   . Cancer     lung ca    ALLERGIES:  has No Known Allergies.  MEDICATIONS:  Current Outpatient Prescriptions  Medication Sig Dispense Refill  . citalopram (CELEXA) 20 MG tablet Take 20 mg by mouth daily.      . clonazePAM (KLONOPIN) 0.5 MG tablet Take 0.5 mg by mouth at bedtime as needed (sleep).       No current facility-administered medications for this visit.    SURGICAL HISTORY:  Past Surgical History  Procedure Laterality Date  . Foot amputation Right     traumatic right lower extremity  . Amputation Right 05/21/2012    Procedure: AMPUTATION BELOW KNEE;  Surgeon: Nadara Mustard, MD;  [facility];   Service: Orthopedics;  Laterality: Right;  . Upper jaw      SURGERY FOR INFECTION  . Colonoscopy w/ biopsies and polypectomy    . Amputation Right 06/21/2012    Procedure: Revision AMPUTATION BELOW KNEE Right;  Surgeon: Nadara Mustard, MD;  [facility];  Service: Orthopedics;  Laterality: Right;  Revision right Below Knee Amputation    REVIEW OF SYSTEMS:  A comprehensive review of systems was negative.   PHYSICAL EXAMINATION: General appearance: alert, cooperative and no distress Head: Normocephalic, without obvious abnormality, atraumatic Neck: no adenopathy Lymph nodes: Cervical, supraclavicular, and axillary nodes normal. Resp: clear to auscultation bilaterally Cardio: regular rate and rhythm, S1, S2 normal, no murmur, click, rub or gallop GI: soft, non-tender; bowel sounds normal; no masses,  no organomegaly Extremities: Right below knee amputation  ECOG PERFORMANCE STATUS: 1 - Symptomatic but completely ambulatory  Blood pressure 137/80, pulse 81, temperature 97.9 F (36.6 C), temperature source Oral, resp. rate 18, height 5\' 5"  (1.651 m), weight 140 lb 3.2 oz (63.594 kg), SpO2 100.00%.  LABORATORY DATA: Lab Results  Component Value Date   WBC 8.8 01/15/2013   HGB 14.9 01/15/2013   HCT 45.1 01/15/2013   MCV 90.8 01/15/2013   PLT 334 01/15/2013      Chemistry      Component Value Date/Time   NA 138 01/15/2013 1342   NA 137 06/19/2012 1420   NA 139 07/20/2011 1312   K 4.6 01/15/2013 1342  K 4.2 06/19/2012 1420   K 4.8* 07/20/2011 1312   CL 104 07/19/2012 1432   CL 99 06/19/2012 1420   CL 102 07/20/2011 1312   CO2 26 01/15/2013 1342   CO2 25 06/19/2012 1420   CO2 28 07/20/2011 1312   BUN 9.2 01/15/2013 1342   BUN 10 06/19/2012 1420   BUN 9 07/20/2011 1312   CREATININE 0.8 01/15/2013 1342   CREATININE 0.95 06/19/2012 1420   CREATININE 1.3* 07/20/2011 1312      Component Value Date/Time   CALCIUM 9.9 01/15/2013 1342   CALCIUM 9.5 06/19/2012 1420   CALCIUM 8.9 07/20/2011  1312   ALKPHOS 105 01/15/2013 1342   ALKPHOS 110 06/19/2012 1420   ALKPHOS 88* 07/20/2011 1312   AST 20 01/15/2013 1342   AST 24 06/19/2012 1420   AST 21 07/20/2011 1312   ALT 19 01/15/2013 1342   ALT 29 06/19/2012 1420   ALT 27 07/20/2011 1312   BILITOT 0.35 01/15/2013 1342   BILITOT 0.1* 06/19/2012 1420   BILITOT 0.60 07/20/2011 1312       RADIOGRAPHIC STUDIES: Ct Chest W Contrast  01/15/2013   CLINICAL DATA:  History of lung cancer diagnosed in January 2010 status post chemotherapy and radiation therapy which are now complete. Shortness of breath.  EXAM: CT CHEST WITH CONTRAST  TECHNIQUE: Multidetector CT imaging of the chest was performed during intravenous contrast administration.  CONTRAST:  80mL OMNIPAQUE IOHEXOL 300 MG/ML  SOLN  COMPARISON:  Chest CT 07/19/2012.  FINDINGS: Mediastinum: Heart size is normal. There is no significant pericardial fluid, thickening or pericardial calcification. No pathologically enlarged mediastinal or hilar lymph nodes. Esophagus is unremarkable in appearance.  Lungs/Pleura: Post procedural changes of radiation therapy are redemonstrated with areas of paramediastinal radiation fibrosis, most pronounced in the medial aspect of the right upper lobe. Associated with this there is chronic dilatation of the bronchus intermedius, and some chronic bronchiectasis within an otherwise chronically collapsed portion of the right upper lobe (both of which are unchanged). No definite suspicious appearing pulmonary nodules or masses are identified to suggest local recurrence of disease or new metastatic disease within the thorax. No acute consolidative airspace disease. No pleural effusions.  Upper Abdomen: Unremarkable.  Musculoskeletal: There are no aggressive appearing lytic or blastic lesions noted in the visualized portions of the skeleton.  IMPRESSION: 1. Stable examination without findings to suggest local recurrence of disease or new metastatic disease in the thorax, as  detailed above.   Electronically Signed   By: Trudie Reed M.D.   On: 01/15/2013 17:05   ASSESSMENT AND PLAN: This is a very pleasant 62 years old [place] American male with history of stage IIIa non-small cell lung cancer status post concurrent chemoradiation followed by 3 cycles of consolidation chemotherapy and has been observation since May of 2010 with no evidence for disease progression. I discussed the scan results with the patient and his daughter. I recommended for him to continue on observation with repeat CT scan of the chest in 6 months. The patient was advised to call immediately if he has any concerning symptoms in the interval.  All questions were answered. The patient knows to call the clinic with any problems, questions or concerns. We can certainly see the patient much sooner if necessary.

## 2013-01-23 ENCOUNTER — Telehealth: Payer: Self-pay | Admitting: Internal Medicine

## 2013-01-23 NOTE — Telephone Encounter (Signed)
s.w. pt and advised on June 2015 appt...mailed pt avs//appt sched and letter

## 2013-01-29 ENCOUNTER — Ambulatory Visit: Payer: Medicare Other | Admitting: Physical Therapy

## 2013-01-30 ENCOUNTER — Encounter: Payer: Medicare Other | Admitting: Physical Therapy

## 2013-01-31 ENCOUNTER — Ambulatory Visit: Payer: Medicare Other | Admitting: Physical Therapy

## 2013-02-05 ENCOUNTER — Ambulatory Visit: Payer: Medicare Other | Attending: Orthopedic Surgery | Admitting: Physical Therapy

## 2013-02-05 DIAGNOSIS — R269 Unspecified abnormalities of gait and mobility: Secondary | ICD-10-CM | POA: Insufficient documentation

## 2013-02-05 DIAGNOSIS — R5381 Other malaise: Secondary | ICD-10-CM | POA: Insufficient documentation

## 2013-02-05 DIAGNOSIS — Z4789 Encounter for other orthopedic aftercare: Secondary | ICD-10-CM | POA: Diagnosis not present

## 2013-02-05 DIAGNOSIS — M2550 Pain in unspecified joint: Secondary | ICD-10-CM | POA: Diagnosis not present

## 2013-02-07 ENCOUNTER — Ambulatory Visit: Payer: Medicare Other | Admitting: Physical Therapy

## 2013-02-07 DIAGNOSIS — R269 Unspecified abnormalities of gait and mobility: Secondary | ICD-10-CM | POA: Diagnosis not present

## 2013-02-07 DIAGNOSIS — Z4789 Encounter for other orthopedic aftercare: Secondary | ICD-10-CM | POA: Diagnosis not present

## 2013-02-07 DIAGNOSIS — R5381 Other malaise: Secondary | ICD-10-CM | POA: Diagnosis not present

## 2013-02-07 DIAGNOSIS — M2550 Pain in unspecified joint: Secondary | ICD-10-CM | POA: Diagnosis not present

## 2013-02-12 ENCOUNTER — Ambulatory Visit: Payer: Medicare Other | Admitting: Physical Therapy

## 2013-02-12 DIAGNOSIS — Z4789 Encounter for other orthopedic aftercare: Secondary | ICD-10-CM | POA: Diagnosis not present

## 2013-02-12 DIAGNOSIS — R5381 Other malaise: Secondary | ICD-10-CM | POA: Diagnosis not present

## 2013-02-12 DIAGNOSIS — M2550 Pain in unspecified joint: Secondary | ICD-10-CM | POA: Diagnosis not present

## 2013-02-12 DIAGNOSIS — R269 Unspecified abnormalities of gait and mobility: Secondary | ICD-10-CM | POA: Diagnosis not present

## 2013-02-14 ENCOUNTER — Ambulatory Visit: Payer: Medicare Other | Admitting: Physical Therapy

## 2013-02-19 ENCOUNTER — Ambulatory Visit: Payer: Medicare Other | Admitting: Physical Therapy

## 2013-02-21 ENCOUNTER — Ambulatory Visit: Payer: Medicare Other | Admitting: Physical Therapy

## 2013-02-21 DIAGNOSIS — R269 Unspecified abnormalities of gait and mobility: Secondary | ICD-10-CM | POA: Diagnosis not present

## 2013-02-21 DIAGNOSIS — M2550 Pain in unspecified joint: Secondary | ICD-10-CM | POA: Diagnosis not present

## 2013-02-21 DIAGNOSIS — Z4789 Encounter for other orthopedic aftercare: Secondary | ICD-10-CM | POA: Diagnosis not present

## 2013-02-21 DIAGNOSIS — R5381 Other malaise: Secondary | ICD-10-CM | POA: Diagnosis not present

## 2013-02-22 ENCOUNTER — Ambulatory Visit: Payer: Medicare Other | Admitting: Physical Therapy

## 2013-02-22 DIAGNOSIS — M2550 Pain in unspecified joint: Secondary | ICD-10-CM | POA: Diagnosis not present

## 2013-02-22 DIAGNOSIS — R5381 Other malaise: Secondary | ICD-10-CM | POA: Diagnosis not present

## 2013-02-22 DIAGNOSIS — R269 Unspecified abnormalities of gait and mobility: Secondary | ICD-10-CM | POA: Diagnosis not present

## 2013-02-22 DIAGNOSIS — Z4789 Encounter for other orthopedic aftercare: Secondary | ICD-10-CM | POA: Diagnosis not present

## 2013-02-26 ENCOUNTER — Ambulatory Visit: Payer: Medicare Other | Admitting: Physical Therapy

## 2013-02-26 DIAGNOSIS — M2550 Pain in unspecified joint: Secondary | ICD-10-CM | POA: Diagnosis not present

## 2013-02-26 DIAGNOSIS — R269 Unspecified abnormalities of gait and mobility: Secondary | ICD-10-CM | POA: Diagnosis not present

## 2013-02-26 DIAGNOSIS — R5381 Other malaise: Secondary | ICD-10-CM | POA: Diagnosis not present

## 2013-02-26 DIAGNOSIS — Z4789 Encounter for other orthopedic aftercare: Secondary | ICD-10-CM | POA: Diagnosis not present

## 2013-02-28 ENCOUNTER — Ambulatory Visit: Payer: Medicare Other | Admitting: Physical Therapy

## 2013-02-28 DIAGNOSIS — M2550 Pain in unspecified joint: Secondary | ICD-10-CM | POA: Diagnosis not present

## 2013-02-28 DIAGNOSIS — Z4789 Encounter for other orthopedic aftercare: Secondary | ICD-10-CM | POA: Diagnosis not present

## 2013-02-28 DIAGNOSIS — R269 Unspecified abnormalities of gait and mobility: Secondary | ICD-10-CM | POA: Diagnosis not present

## 2013-02-28 DIAGNOSIS — R5381 Other malaise: Secondary | ICD-10-CM | POA: Diagnosis not present

## 2013-03-05 ENCOUNTER — Ambulatory Visit: Payer: Medicare Other | Attending: Orthopedic Surgery | Admitting: Physical Therapy

## 2013-03-05 DIAGNOSIS — R5381 Other malaise: Secondary | ICD-10-CM | POA: Diagnosis not present

## 2013-03-05 DIAGNOSIS — Z4789 Encounter for other orthopedic aftercare: Secondary | ICD-10-CM | POA: Diagnosis not present

## 2013-03-05 DIAGNOSIS — R269 Unspecified abnormalities of gait and mobility: Secondary | ICD-10-CM | POA: Diagnosis not present

## 2013-03-05 DIAGNOSIS — M2550 Pain in unspecified joint: Secondary | ICD-10-CM | POA: Diagnosis not present

## 2013-03-07 ENCOUNTER — Ambulatory Visit: Payer: Medicare Other | Admitting: Physical Therapy

## 2013-03-12 ENCOUNTER — Ambulatory Visit: Payer: Medicare Other | Admitting: Physical Therapy

## 2013-03-13 ENCOUNTER — Ambulatory Visit: Payer: Medicare Other | Admitting: Physical Therapy

## 2013-03-15 ENCOUNTER — Ambulatory Visit: Payer: Medicare Other | Admitting: Physical Therapy

## 2013-03-19 ENCOUNTER — Ambulatory Visit: Payer: Medicare Other | Admitting: Physical Therapy

## 2013-03-21 ENCOUNTER — Ambulatory Visit: Payer: Medicare Other | Admitting: Physical Therapy

## 2013-03-26 ENCOUNTER — Ambulatory Visit: Payer: Medicare Other | Admitting: Physical Therapy

## 2013-03-28 ENCOUNTER — Ambulatory Visit: Payer: Medicare Other | Admitting: Physical Therapy

## 2013-04-02 ENCOUNTER — Ambulatory Visit: Payer: Medicare Other | Attending: Orthopedic Surgery | Admitting: Physical Therapy

## 2013-04-02 DIAGNOSIS — R5381 Other malaise: Secondary | ICD-10-CM | POA: Diagnosis not present

## 2013-04-02 DIAGNOSIS — M2550 Pain in unspecified joint: Secondary | ICD-10-CM | POA: Insufficient documentation

## 2013-04-02 DIAGNOSIS — Z4789 Encounter for other orthopedic aftercare: Secondary | ICD-10-CM | POA: Diagnosis not present

## 2013-04-02 DIAGNOSIS — R269 Unspecified abnormalities of gait and mobility: Secondary | ICD-10-CM | POA: Insufficient documentation

## 2013-04-04 ENCOUNTER — Ambulatory Visit: Payer: Medicare Other | Admitting: Physical Therapy

## 2013-04-09 ENCOUNTER — Encounter: Payer: Medicare Other | Admitting: Physical Therapy

## 2013-04-11 ENCOUNTER — Encounter: Payer: Medicare Other | Admitting: Physical Therapy

## 2013-04-24 ENCOUNTER — Ambulatory Visit: Payer: Self-pay | Admitting: Family Medicine

## 2013-04-24 VITALS — BP 104/82 | HR 76 | Temp 98.0°F | Resp 18 | Ht 64.0 in | Wt 136.8 lb

## 2013-04-24 DIAGNOSIS — Z021 Encounter for pre-employment examination: Secondary | ICD-10-CM

## 2013-04-24 DIAGNOSIS — Z0289 Encounter for other administrative examinations: Secondary | ICD-10-CM

## 2013-04-24 NOTE — Patient Instructions (Signed)
Getting your DOT card is going to be a process; we need to get your Skills performance exam and touch base with your pulmonologist.  In addition, you need to obtain glasses or contacts to ensure your far vision is at least 20/40.  Right now your right eye is at 20/50
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
## 2013-05-09 ENCOUNTER — Telehealth: Payer: Self-pay

## 2013-05-09 NOTE — Telephone Encounter (Signed)
Patient came in for DOT exam on 04/24/2013. States that he cannot locate his exam paper and is unsure if we kept it and faxed it to [facility] for him or if misplaced it.   [phone]

## 2013-05-15 NOTE — Telephone Encounter (Signed)
It is ultimately the patient's responsibility to send for to  [facility]. DOT form not scanned into patient chart yet. Will look for it.

## 2013-05-28 DIAGNOSIS — H2510 Age-related nuclear cataract, unspecified eye: Secondary | ICD-10-CM | POA: Diagnosis not present

## 2013-05-28 DIAGNOSIS — H40039 Anatomical narrow angle, unspecified eye: Secondary | ICD-10-CM | POA: Diagnosis not present

## 2013-05-29 ENCOUNTER — Telehealth: Payer: Self-pay

## 2013-05-29 NOTE — Telephone Encounter (Signed)
Patient called stated he seen Dr.Copland for his DOT Physical. Patient stated he completed his eye exam yesterday 05/28/13 and he is faxing over his results today for Dr.Copland to pass his DOT Physical. [phone]

## 2013-05-30 NOTE — Telephone Encounter (Signed)
Dr. Lorelei Pont, this is in your box
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
## 2013-06-04 NOTE — Telephone Encounter (Signed)
He seems to have lost his long form- this was given to him I believe so he could have it updated by optometry.  I did a new long form for him with the information I have available.  He is not qualified as he needs an update from pulmonology and a SPE for his amputated limb.    Will fax to proper agency

## 2013-06-08 ENCOUNTER — Telehealth: Payer: Self-pay

## 2013-06-08 NOTE — Telephone Encounter (Signed)
COPLAND - Pt had paperwork that had to be filled out by you and then sent to [facility] for his DOT physical.  Please call him to let him know about this [phone]

## 2013-06-08 NOTE — Telephone Encounter (Signed)
Called and let him know that I did fax his DOT form.  He does not pass because he still needs his skills performance exam.  He is aware of this and will let me know if he does not hear about the next step

## 2013-06-18 ENCOUNTER — Telehealth: Payer: Self-pay

## 2013-06-18 NOTE — Telephone Encounter (Signed)
PATIENT CALLED TO ASK DR. COPLAND TO FAX PAPER WORK TO [place] IN ORDER TO BE GIVEN THE SKILLS PERFORMANCE EXAM. FAX: [phone] ATTN:MR. BOLLICK * IF PATIENT IS UNABLE TO ANSWER PHONE, [place], [place], AT  [phone]

## 2013-06-18 NOTE — Telephone Encounter (Signed)
Please advise 

## 2013-06-19 ENCOUNTER — Encounter: Payer: Self-pay | Admitting: Family Medicine

## 2013-06-19 NOTE — Telephone Encounter (Signed)
Pt called to give number for information regarding his evaluation for DOT that Dr. Lorelei Pont needs: [phone] said he can be reached at [phone] or [phone].

## 2013-06-19 NOTE — Telephone Encounter (Signed)
Hi- is there a DOT "long form" for this gentleman?  It should be scanned in but I can't locate it.  Thanks!

## 2013-06-19 NOTE — Telephone Encounter (Signed)
Please call them back- I did fax his DOT physical exam.  Do they have the phone number for this person?  Then I can call and try to find out if there is something else that they need

## 2013-06-20 NOTE — Telephone Encounter (Signed)
This is scanned in, was scanned yesterday. Thanks. Amy

## 2013-06-21 NOTE — Telephone Encounter (Signed)
Faxed letter and copy of long form yesterday.  Called today to let him know but both numbers listed are disconnected

## 2013-07-24 ENCOUNTER — Encounter (HOSPITAL_COMMUNITY): Payer: Self-pay

## 2013-07-24 ENCOUNTER — Other Ambulatory Visit (HOSPITAL_BASED_OUTPATIENT_CLINIC_OR_DEPARTMENT_OTHER): Payer: Medicare Other

## 2013-07-24 ENCOUNTER — Ambulatory Visit (HOSPITAL_COMMUNITY)
Admission: RE | Admit: 2013-07-24 | Discharge: 2013-07-24 | Disposition: A | Discharge status: Home / Self Care | Payer: Medicare Other | Source: Ambulatory Visit | Attending: Internal Medicine | Admitting: Internal Medicine

## 2013-07-24 DIAGNOSIS — C3410 Malignant neoplasm of upper lobe, unspecified bronchus or lung: Secondary | ICD-10-CM

## 2013-07-24 DIAGNOSIS — J479 Bronchiectasis, uncomplicated: Secondary | ICD-10-CM | POA: Insufficient documentation

## 2013-07-24 DIAGNOSIS — Z85118 Personal history of other malignant neoplasm of bronchus and lung: Secondary | ICD-10-CM | POA: Insufficient documentation

## 2013-07-24 DIAGNOSIS — C3490 Malignant neoplasm of unspecified part of unspecified bronchus or lung: Secondary | ICD-10-CM | POA: Insufficient documentation

## 2013-07-24 DIAGNOSIS — Z9221 Personal history of antineoplastic chemotherapy: Secondary | ICD-10-CM | POA: Insufficient documentation

## 2013-07-24 LAB — CBC WITH DIFFERENTIAL/PLATELET
BASO%: 0.7 % (ref 0.0–2.0)
Basophils Absolute: 0.1 10*3/uL (ref 0.0–0.1)
EOS%: 5.6 % (ref 0.0–7.0)
Eosinophils Absolute: 0.4 10*3/uL (ref 0.0–0.5)
HCT: 45.7 % (ref 38.4–49.9)
HGB: 14.9 g/dL (ref 13.0–17.1)
LYMPH%: 16.1 % (ref 14.0–49.0)
MCH: 28.9 pg (ref 27.2–33.4)
MCHC: 32.5 g/dL (ref 32.0–36.0)
MCV: 89.1 fL (ref 79.3–98.0)
MONO#: 0.5 10*3/uL (ref 0.1–0.9)
MONO%: 5.8 % (ref 0.0–14.0)
NEUT%: 71.8 % (ref 39.0–75.0)
NEUTROS ABS: 5.6 10*3/uL (ref 1.5–6.5)
PLATELETS: 338 10*3/uL (ref 140–400)
RBC: 5.13 10*6/uL (ref 4.20–5.82)
RDW: 13.9 % (ref 11.0–14.6)
WBC: 7.9 10*3/uL (ref 4.0–10.3)
lymph#: 1.3 10*3/uL (ref 0.9–3.3)

## 2013-07-24 LAB — COMPREHENSIVE METABOLIC PANEL
ALBUMIN: 3.5 g/dL (ref 3.5–5.2)
ALK PHOS: 113 U/L (ref 39–117)
ALT: 14 U/L (ref 0–53)
AST: 14 U/L (ref 0–37)
BILIRUBIN TOTAL: 0.4 mg/dL (ref 0.2–1.2)
BUN: 11 mg/dL (ref 6–23)
CO2: 23 mEq/L (ref 19–32)
Calcium: 9.4 mg/dL (ref 8.4–10.5)
Chloride: 101 mEq/L (ref 96–112)
Creatinine, Ser: 0.86 mg/dL (ref 0.50–1.35)
Glucose, Bld: 104 mg/dL — ABNORMAL HIGH (ref 70–99)
POTASSIUM: 4.4 meq/L (ref 3.5–5.3)
SODIUM: 138 meq/L (ref 135–145)
TOTAL PROTEIN: 7.2 g/dL (ref 6.0–8.3)

## 2013-07-24 MED ORDER — IOHEXOL 300 MG/ML  SOLN
80.0000 mL | Freq: Once | INTRAMUSCULAR | Status: AC | PRN
  Administered 2013-07-24: 80 mL via INTRAVENOUS

## 2013-07-31 ENCOUNTER — Encounter: Payer: Self-pay | Admitting: Internal Medicine

## 2013-07-31 ENCOUNTER — Ambulatory Visit (HOSPITAL_BASED_OUTPATIENT_CLINIC_OR_DEPARTMENT_OTHER): Payer: Medicare Other | Admitting: Internal Medicine

## 2013-07-31 VITALS — BP 121/79 | HR 55 | Temp 97.4°F | Resp 18 | Ht 64.0 in | Wt 137.3 lb

## 2013-07-31 DIAGNOSIS — C3410 Malignant neoplasm of upper lobe, unspecified bronchus or lung: Secondary | ICD-10-CM

## 2013-07-31 DIAGNOSIS — Z85118 Personal history of other malignant neoplasm of bronchus and lung: Secondary | ICD-10-CM

## 2013-07-31 NOTE — Progress Notes (Signed)
[facility] Telephone:(336) [phone]   Fax:(336) [place], [place] [phone]  PRINCIPAL DIAGNOSIS: Stage IIIA (T2 N2 Mx) non-small cell lung cancer, adenocarcinoma, diagnosed in June 2010.   PRIOR THERAPY:  1. Status post concurrent chemoradiation with weekly carboplatin and paclitaxel; last dose was given April 08, 2008. 2. Status post 3 cycles of consolidation chemotherapy with docetaxel; last dose was given on Jun 26, 2008.  CURRENT THERAPY: Observation.  INTERVAL HISTORY: Evan [name] 63 y.o. male returns to the clinic today for followup visit accompanied his daughter. 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 He is now able to walk at least 2 miles every day. He is currently unemployed. He denied having any significant chest pain or shortness of breath. He denied having any cough or hemoptysis. He denied having any significant weight loss or night sweats. The patient has no nausea or vomiting. The patient has repeat CT scan of the chest performed recently and he is here for evaluation and discussion of his scan results.  MEDICAL HISTORY: Past Medical History  Diagnosis Date  . Complication of anesthesia     " sometimes I wake up during surgery "  . Anxiety   . Depression   . Cancer     lung ca    ALLERGIES:  has No Known Allergies.  MEDICATIONS:  No current outpatient prescriptions on file.   No current facility-administered medications for this visit.    SURGICAL HISTORY:  Past Surgical History  Procedure Laterality Date  . Foot amputation Right     traumatic right lower extremity  . Amputation Right 05/21/2012    Procedure: AMPUTATION BELOW KNEE;  Surgeon: Newt Minion, MD;  Location: [facility];  Service: Orthopedics;  Laterality: Right;  . Upper jaw      SURGERY FOR INFECTION  . Colonoscopy w/ biopsies and polypectomy    . Amputation Right  06/21/2012    Procedure: Revision AMPUTATION BELOW KNEE Right;  Surgeon: Newt Minion, MD;  Location: [facility];  Service: Orthopedics;  Laterality: Right;  Revision right Below Knee Amputation    REVIEW OF SYSTEMS:  A comprehensive review of systems was negative.   PHYSICAL EXAMINATION: General appearance: alert, cooperative and no distress Head: Normocephalic, without obvious abnormality, atraumatic Neck: no adenopathy Lymph nodes: Cervical, supraclavicular, and axillary nodes normal. Resp: clear to auscultation bilaterally Cardio: regular rate and rhythm, S1, S2 normal, no murmur, click, rub or gallop GI: soft, non-tender; bowel sounds normal; no masses,  no organomegaly Extremities: Right below knee amputation  ECOG PERFORMANCE STATUS: 1 - Symptomatic but completely ambulatory  Blood pressure 121/79, pulse 55, temperature 97.4 F (36.3 C), temperature source Oral, resp. rate 18, height 5\' 4"  (1.626 m), weight 137 lb 4.8 oz (62.279 kg).  LABORATORY DATA: Lab Results  Component Value Date   WBC 7.9 07/24/2013   HGB 14.9 07/24/2013   HCT 45.7 07/24/2013   MCV 89.1 07/24/2013   PLT 338 07/24/2013      Chemistry      Component Value Date/Time   NA 138 07/24/2013 1454   NA 138 01/15/2013 1342   NA 139 07/20/2011 1312   K 4.4 07/24/2013 1454   K 4.6 01/15/2013 1342   K 4.8* 07/20/2011 1312   CL 101 07/24/2013 1454   CL 104 07/19/2012 1432   CL 102 07/20/2011 1312  CO2 23 07/24/2013 1454   CO2 26 01/15/2013 1342   CO2 28 07/20/2011 1312   BUN 11 07/24/2013 1454   BUN 9.2 01/15/2013 1342   BUN 9 07/20/2011 1312   CREATININE 0.86 07/24/2013 1454   CREATININE 0.8 01/15/2013 1342   CREATININE 1.3* 07/20/2011 1312      Component Value Date/Time   CALCIUM 9.4 07/24/2013 1454   CALCIUM 9.9 01/15/2013 1342   CALCIUM 8.9 07/20/2011 1312   ALKPHOS 113 07/24/2013 1454   ALKPHOS 105 01/15/2013 1342   ALKPHOS 88* 07/20/2011 1312   AST 14 07/24/2013 1454   AST 20 01/15/2013 1342   AST 21 07/20/2011  1312   ALT 14 07/24/2013 1454   ALT 19 01/15/2013 1342   ALT 27 07/20/2011 1312   BILITOT 0.4 07/24/2013 1454   BILITOT 0.35 01/15/2013 1342   BILITOT 0.60 07/20/2011 1312       RADIOGRAPHIC STUDIES: Ct Chest W Contrast  07/24/2013   CLINICAL DATA:  Lung cancer diagnosed 2010.  Chemotherapy complete  EXAM: CT CHEST WITH CONTRAST  TECHNIQUE: Multidetector CT imaging of the chest was performed during intravenous contrast administration.  CONTRAST:  28mL OMNIPAQUE IOHEXOL 300 MG/ML  SOLN  COMPARISON:  CT 01/15/2013  FINDINGS: There is a posttherapy bronchiectasis and consolidation in the medial aspect of the right upper lobe not changed from prior. No suspicious pulmonary nodules.  No axillary or supraclavicular lymphadenopathy. No mediastinal hilar lymphadenopathy. No pericardial fluid.  Limited view of the upper abdomen demonstrates normal adrenal glands. No focal hepatic lesion.  Review of the skeleton demonstrates no aggressive osseous lesion.  IMPRESSION: 1. Stable posttherapy change in the right upper lobe. 2. No evidence of lung cancer recurrence.   Electronically Signed   By: Suzy Bouchard M.D.   On: 07/24/2013 17:12   ASSESSMENT AND PLAN: This is a very pleasant 63 years old [place] American male with history of stage IIIa non-small cell lung cancer status post concurrent chemoradiation followed by 3 cycles of consolidation chemotherapy and has been observation since May of 2010 with no evidence for disease progression. He is now 5 years since his initial diagnosis with no evidence for disease progression. I discussed the scan results with the patient.  I recommended for him to continue on observation with repeat CT scan of the chest in one year.  The patient was advised to call immediately if he has any concerning symptoms in the interval.  All questions were answered. The patient knows to call the clinic with any problems, questions or concerns. We can certainly see the patient much  sooner if necessary.  Disclaimer: This note was dictated with voice recognition software. Similar sounding words can inadvertently be transcribed and may not be corrected upon review.

## 2013-07-31 NOTE — Patient Instructions (Signed)
You Can Quit Smoking If you are ready to quit smoking or are thinking about it, congratulations! You have chosen to help yourself be healthier and live longer! There are lots of different ways to quit smoking. Nicotine gum, nicotine patches, a nicotine inhaler, or nicotine nasal spray can help with physical craving. Hypnosis, support groups, and medicines help break the habit of smoking. TIPS TO GET OFF AND STAY OFF CIGARETTES  Learn to predict your moods. Do not let a bad situation be your excuse to have a cigarette. Some situations in your life might tempt you to have a cigarette.  Ask friends and co-workers not to smoke around you.  Make your home smoke-free.  Never have "just one" cigarette. It leads to wanting another and another. Remind yourself of your decision to quit.  On a card, make a list of your reasons for not smoking. Read it at least the same number of times a day as you have a cigarette. Tell yourself everyday, "I do not want to smoke. I choose not to smoke."  Ask someone at home or work to help you with your plan to quit smoking.  Have something planned after you eat or have a cup of coffee. Take a walk or get other exercise to perk you up. This will help to keep you from overeating.  Try a relaxation exercise to calm you down and decrease your stress. Remember, you may be tense and nervous the first two weeks after you quit. This will pass.  Find new activities to keep your hands busy. Play with a pen, coin, or rubber band. Doodle or draw things on paper.  Brush your teeth right after eating. This will help cut down the craving for the taste of tobacco after meals. You can try mouthwash too.  Try gum, breath mints, or diet candy to keep something in your mouth. IF YOU SMOKE AND WANT TO QUIT:  Do not stock up on cigarettes. Never buy a carton. Wait until one pack is finished before you buy another.  Never carry cigarettes with you at work or at home.  Keep cigarettes  as far away from you as possible. Leave them with someone else.  Never carry matches or a lighter with you.  Ask yourself, "Do I need this cigarette or is this just a reflex?"  Bet with someone that you can quit. Put cigarette money in a piggy bank every morning. If you smoke, you give up the money. If you do not smoke, by the end of the week, you keep the money.  Keep trying. It takes 21 days to change a habit!  Talk to your doctor about using medicines to help you quit. These include nicotine replacement gum, lozenges, or skin patches. Document Released: 11/14/2008 Document Revised: 04/12/2011 Document Reviewed: 11/14/2008 ExitCare Patient Information 2015 [employer]. This information is not intended to replace advice given to you by your health care provider. Make sure you discuss any questions you have with your health care provider.  

## 2013-08-01 ENCOUNTER — Telehealth: Payer: Self-pay | Admitting: Internal Medicine

## 2013-08-01 NOTE — Telephone Encounter (Signed)
Cld pt to confirm apt per 06/30 POF for labs/ov for 2016

## 2014-07-25 ENCOUNTER — Ambulatory Visit (HOSPITAL_COMMUNITY)
Admission: RE | Admit: 2014-07-25 | Discharge: 2014-07-25 | Disposition: A | Discharge status: Home / Self Care | Payer: Medicare Other | Source: Ambulatory Visit | Attending: Internal Medicine | Admitting: Internal Medicine

## 2014-07-25 ENCOUNTER — Encounter (HOSPITAL_COMMUNITY): Payer: Self-pay

## 2014-07-25 ENCOUNTER — Other Ambulatory Visit (HOSPITAL_BASED_OUTPATIENT_CLINIC_OR_DEPARTMENT_OTHER): Payer: Medicare Other

## 2014-07-25 DIAGNOSIS — C3491 Malignant neoplasm of unspecified part of right bronchus or lung: Secondary | ICD-10-CM | POA: Diagnosis not present

## 2014-07-25 DIAGNOSIS — C3411 Malignant neoplasm of upper lobe, right bronchus or lung: Secondary | ICD-10-CM | POA: Diagnosis not present

## 2014-07-25 DIAGNOSIS — Z9221 Personal history of antineoplastic chemotherapy: Secondary | ICD-10-CM | POA: Insufficient documentation

## 2014-07-25 DIAGNOSIS — C3410 Malignant neoplasm of upper lobe, unspecified bronchus or lung: Secondary | ICD-10-CM

## 2014-07-25 DIAGNOSIS — Z85118 Personal history of other malignant neoplasm of bronchus and lung: Secondary | ICD-10-CM | POA: Diagnosis present

## 2014-07-25 DIAGNOSIS — Z923 Personal history of irradiation: Secondary | ICD-10-CM | POA: Insufficient documentation

## 2014-07-25 LAB — CBC WITH DIFFERENTIAL/PLATELET
BASO%: 0.5 % (ref 0.0–2.0)
Basophils Absolute: 0 10*3/uL (ref 0.0–0.1)
EOS%: 3.7 % (ref 0.0–7.0)
Eosinophils Absolute: 0.3 10*3/uL (ref 0.0–0.5)
HEMATOCRIT: 38.8 % (ref 38.4–49.9)
HGB: 12.5 g/dL — ABNORMAL LOW (ref 13.0–17.1)
LYMPH%: 15.7 % (ref 14.0–49.0)
MCH: 27.9 pg (ref 27.2–33.4)
MCHC: 32.3 g/dL (ref 32.0–36.0)
MCV: 86.5 fL (ref 79.3–98.0)
MONO#: 0.6 10*3/uL (ref 0.1–0.9)
MONO%: 7.5 % (ref 0.0–14.0)
NEUT#: 6 10*3/uL (ref 1.5–6.5)
NEUT%: 72.6 % (ref 39.0–75.0)
PLATELETS: 516 10*3/uL — AB (ref 140–400)
RBC: 4.49 10*6/uL (ref 4.20–5.82)
RDW: 13.3 % (ref 11.0–14.6)
WBC: 8.2 10*3/uL (ref 4.0–10.3)
lymph#: 1.3 10*3/uL (ref 0.9–3.3)

## 2014-07-25 LAB — COMPREHENSIVE METABOLIC PANEL (CC13)
ALK PHOS: 97 U/L (ref 40–150)
ALT: 9 U/L (ref 0–55)
ANION GAP: 7 meq/L (ref 3–11)
AST: 12 U/L (ref 5–34)
Albumin: 3 g/dL — ABNORMAL LOW (ref 3.5–5.0)
BILIRUBIN TOTAL: 0.3 mg/dL (ref 0.20–1.20)
BUN: 7.3 mg/dL (ref 7.0–26.0)
CALCIUM: 9.3 mg/dL (ref 8.4–10.4)
CO2: 26 meq/L (ref 22–29)
Chloride: 105 mEq/L (ref 98–109)
Creatinine: 0.8 mg/dL (ref 0.7–1.3)
GLUCOSE: 102 mg/dL (ref 70–140)
Potassium: 4.2 mEq/L (ref 3.5–5.1)
Sodium: 138 mEq/L (ref 136–145)
Total Protein: 7.6 g/dL (ref 6.4–8.3)

## 2014-07-25 MED ORDER — IOHEXOL 300 MG/ML  SOLN
80.0000 mL | Freq: Once | INTRAMUSCULAR | Status: AC | PRN
  Administered 2014-07-25: 80 mL via INTRAVENOUS

## 2014-08-01 ENCOUNTER — Ambulatory Visit (HOSPITAL_BASED_OUTPATIENT_CLINIC_OR_DEPARTMENT_OTHER): Payer: Medicare Other | Admitting: Internal Medicine

## 2014-08-01 ENCOUNTER — Telehealth: Payer: Self-pay | Admitting: Internal Medicine

## 2014-08-01 ENCOUNTER — Encounter: Payer: Self-pay | Admitting: Internal Medicine

## 2014-08-01 VITALS — BP 136/85 | HR 89 | Temp 98.9°F | Resp 18 | Ht 64.0 in | Wt 121.8 lb

## 2014-08-01 DIAGNOSIS — Z85110 Personal history of malignant carcinoid tumor of bronchus and lung: Secondary | ICD-10-CM

## 2014-08-01 DIAGNOSIS — C3492 Malignant neoplasm of unspecified part of left bronchus or lung: Secondary | ICD-10-CM

## 2014-08-01 NOTE — Telephone Encounter (Signed)
per pof to sch pt appt-sent Mohamed an emailt o adv no openings-adv pt will calll after reply

## 2014-08-01 NOTE — Patient Instructions (Signed)
Smoking Cessation Quitting smoking is important to your health and has many advantages. However, it is not always easy to quit since nicotine is a very addictive drug. Oftentimes, people try 3 times or more before being able to quit. This document explains the best ways for you to prepare to quit smoking. Quitting takes hard work and a lot of effort, but you can do it. ADVANTAGES OF QUITTING SMOKING  You will live longer, feel better, and live better.  Your body will feel the impact of quitting smoking almost immediately.  Within 20 minutes, blood pressure decreases. Your pulse returns to its normal level.  After 8 hours, carbon monoxide levels in the blood return to normal. Your oxygen level increases.  After 24 hours, the chance of having a heart attack starts to decrease. Your breath, hair, and body stop smelling like smoke.  After 48 hours, damaged nerve endings begin to recover. Your sense of taste and smell improve.  After 72 hours, the body is virtually free of nicotine. Your bronchial tubes relax and breathing becomes easier.  After 2 to 12 weeks, lungs can hold more air. Exercise becomes easier and circulation improves.  The risk of having a heart attack, stroke, cancer, or lung disease is greatly reduced.  After 1 year, the risk of coronary heart disease is cut in half.  After 5 years, the risk of stroke falls to the same as a nonsmoker.  After 10 years, the risk of lung cancer is cut in half and the risk of other cancers decreases significantly.  After 15 years, the risk of coronary heart disease drops, usually to the level of a nonsmoker.  If you are pregnant, quitting smoking will improve your chances of having a healthy baby.  The people you live with, especially any children, will be healthier.  You will have extra money to spend on things other than cigarettes. QUESTIONS TO THINK ABOUT BEFORE ATTEMPTING TO QUIT You may want to talk about your answers with your  health care provider.  Why do you want to quit?  If you tried to quit in the past, what helped and what did not?  What will be the most difficult situations for you after you quit? How will you plan to handle them?  Who can help you through the tough times? Your family? Friends? A health care provider?  What pleasures do you get from smoking? What ways can you still get pleasure if you quit? Here are some questions to ask your health care provider:  How can you help me to be successful at quitting?  What medicine do you think would be best for me and how should I take it?  What should I do if I need more help?  What is smoking withdrawal like? How can I get information on withdrawal? GET READY  Set a quit date.  Change your environment by getting rid of all cigarettes, ashtrays, matches, and lighters in your home, car, or work. Do not let people smoke in your home.  Review your past attempts to quit. Think about what worked and what did not. GET SUPPORT AND ENCOURAGEMENT You have a better chance of being successful if you have help. You can get support in many ways.  Tell your family, friends, and coworkers that you are going to quit and need their support. Ask them not to smoke around you.  Get individual, group, or telephone counseling and support. Programs are available at local hospitals and health centers. Call   your local health department for information about programs in your area.  Spiritual beliefs and practices may help some smokers quit.  Download a "quit meter" on your computer to keep track of quit statistics, such as how long you have gone without smoking, cigarettes not smoked, and money saved.  Get a self-help book about quitting smoking and staying off tobacco. LEARN NEW SKILLS AND BEHAVIORS  Distract yourself from urges to smoke. Talk to someone, go for a walk, or occupy your time with a task.  Change your normal routine. Take a different route to work.  Drink tea instead of coffee. Eat breakfast in a different place.  Reduce your stress. Take a hot bath, exercise, or read a book.  Plan something enjoyable to do every day. Reward yourself for not smoking.  Explore interactive web-based programs that specialize in helping you quit. GET MEDICINE AND USE IT CORRECTLY Medicines can help you stop smoking and decrease the urge to smoke. Combining medicine with the above behavioral methods and support can greatly increase your chances of successfully quitting smoking.  Nicotine replacement therapy helps deliver nicotine to your body without the negative effects and risks of smoking. Nicotine replacement therapy includes nicotine gum, lozenges, inhalers, nasal sprays, and skin patches. Some may be available over-the-counter and others require a prescription.  Antidepressant medicine helps people abstain from smoking, but how this works is unknown. This medicine is available by prescription.  Nicotinic receptor partial agonist medicine simulates the effect of nicotine in your brain. This medicine is available by prescription. Ask your health care provider for advice about which medicines to use and how to use them based on your health history. Your health care provider will tell you what side effects to look out for if you choose to be on a medicine or therapy. Carefully read the information on the package. Do not use any other product containing nicotine while using a nicotine replacement product.  RELAPSE OR DIFFICULT SITUATIONS Most relapses occur within the first 3 months after quitting. Do not be discouraged if you start smoking again. Remember, most people try several times before finally quitting. You may have symptoms of withdrawal because your body is used to nicotine. You may crave cigarettes, be irritable, feel very hungry, cough often, get headaches, or have difficulty concentrating. The withdrawal symptoms are only temporary. They are strongest  when you first quit, but they will go away within 10-14 days. To reduce the chances of relapse, try to:  Avoid drinking alcohol. Drinking lowers your chances of successfully quitting.  Reduce the amount of caffeine you consume. Once you quit smoking, the amount of caffeine in your body increases and can give you symptoms, such as a rapid heartbeat, sweating, and anxiety.  Avoid smokers because they can make you want to smoke.  Do not let weight gain distract you. Many smokers will gain weight when they quit, usually less than 10 pounds. Eat a healthy diet and stay active. You can always lose the weight gained after you quit.  Find ways to improve your mood other than smoking. FOR MORE INFORMATION  www.smokefree.gov  Document Released: 01/12/2001 Document Revised: 06/04/2013 Document Reviewed: 04/29/2011 ExitCare Patient Information 2015 [employer]. This information is not intended to replace advice given to you by your health care provider. Make sure you discuss any questions you have with your health care provider.  

## 2014-08-01 NOTE — Progress Notes (Signed)
[facility] Telephone:(336) [phone]   Fax:(336) [place], [place] [phone]  PRINCIPAL DIAGNOSIS: Stage IIIA (T2 N2 Mx) non-small cell lung cancer, adenocarcinoma, diagnosed in June 2010.   PRIOR THERAPY:  1. Status post concurrent chemoradiation with weekly carboplatin and paclitaxel; last dose was given April 08, 2008. 2. Status post 3 cycles of consolidation chemotherapy with docetaxel; last dose was given on Jun 26, 2008.  CURRENT THERAPY: Observation.  INTERVAL HISTORY: Evan [name] 64 y.o. male returns to the clinic today for followup visit. He has more complaints today including increasing fatigue, lack of appetite as well as weight loss of around 20 pounds since his last visit year ago. The patient also has mild cough productive of yellowish sputum but no hemoptysis. He also complaining of pain on the left anterior chest wall. The patient has no nausea or vomiting. The patient has repeat CT scan of the chest performed recently and he is here for evaluation and discussion of his scan results.  MEDICAL HISTORY: Past Medical History  Diagnosis Date  . Complication of anesthesia     " sometimes I wake up during surgery "  . Anxiety   . Depression   . Cancer     lung ca    ALLERGIES:  has No Known Allergies.  MEDICATIONS:  No current outpatient prescriptions on file.   No current facility-administered medications for this visit.    SURGICAL HISTORY:  Past Surgical History  Procedure Laterality Date  . Foot amputation Right     traumatic right lower extremity  . Amputation Right 05/21/2012    Procedure: AMPUTATION BELOW KNEE;  Surgeon: Newt Minion, MD;  Location: [facility];  Service: Orthopedics;  Laterality: Right;  . Upper jaw      SURGERY FOR INFECTION  . Colonoscopy w/ biopsies and polypectomy    . Amputation Right 06/21/2012    Procedure: Revision AMPUTATION BELOW KNEE Right;   Surgeon: Newt Minion, MD;  Location: [facility];  Service: Orthopedics;  Laterality: Right;  Revision right Below Knee Amputation    REVIEW OF SYSTEMS:  A comprehensive review of systems was negative except for: Constitutional: positive for anorexia, fatigue and weight loss Respiratory: positive for cough, dyspnea on exertion and pleurisy/chest pain   PHYSICAL EXAMINATION: General appearance: alert, cooperative and no distress Head: Normocephalic, without obvious abnormality, atraumatic Neck: no adenopathy Lymph nodes: Cervical, supraclavicular, and axillary nodes normal. Resp: clear to auscultation bilaterally Cardio: regular rate and rhythm, S1, S2 normal, no murmur, click, rub or gallop GI: soft, non-tender; bowel sounds normal; no masses,  no organomegaly Extremities: Right below knee amputation  ECOG PERFORMANCE STATUS: 1 - Symptomatic but completely ambulatory  Blood pressure 136/85, pulse 89, temperature 98.9 F (37.2 C), temperature source Oral, resp. rate 18, height '5\' 4"'$  (1.626 m), weight 121 lb 12.8 oz (55.248 kg), SpO2 100 %.  LABORATORY DATA: Lab Results  Component Value Date   WBC 8.2 07/25/2014   HGB 12.5* 07/25/2014   HCT 38.8 07/25/2014   MCV 86.5 07/25/2014   PLT 516* 07/25/2014      Chemistry      Component Value Date/Time   NA 138 07/25/2014 0817   NA 138 07/24/2013 1454   NA 139 07/20/2011 1312   K 4.2 07/25/2014 0817   K 4.4 07/24/2013 1454   K 4.8* 07/20/2011 1312   CL 101 07/24/2013 1454   CL  104 07/19/2012 1432   CL 102 07/20/2011 1312   CO2 26 07/25/2014 0817   CO2 23 07/24/2013 1454   CO2 28 07/20/2011 1312   BUN 7.3 07/25/2014 0817   BUN 11 07/24/2013 1454   BUN 9 07/20/2011 1312   CREATININE 0.8 07/25/2014 0817   CREATININE 0.86 07/24/2013 1454   CREATININE 1.3* 07/20/2011 1312      Component Value Date/Time   CALCIUM 9.3 07/25/2014 0817   CALCIUM 9.4 07/24/2013 1454   CALCIUM 8.9 07/20/2011 1312   ALKPHOS 97 07/25/2014 0817   ALKPHOS  113 07/24/2013 1454   ALKPHOS 88* 07/20/2011 1312   AST 12 07/25/2014 0817   AST 14 07/24/2013 1454   AST 21 07/20/2011 1312   ALT 9 07/25/2014 0817   ALT 14 07/24/2013 1454   ALT 27 07/20/2011 1312   BILITOT 0.30 07/25/2014 0817   BILITOT 0.4 07/24/2013 1454   BILITOT 0.60 07/20/2011 1312       RADIOGRAPHIC STUDIES: Ct Chest W Contrast  07/25/2014   CLINICAL DATA:  Right lung cancer diagnosed 2010, XRT/chemotherapy complete  EXAM: CT CHEST WITH CONTRAST  TECHNIQUE: Multidetector CT imaging of the chest was performed during intravenous contrast administration.  CONTRAST:  62m OMNIPAQUE IOHEXOL 300 MG/ML  SOLN  COMPARISON:  07/24/2013  FINDINGS: Mediastinum/Nodes: The heart is top-normal in size. No pericardial effusion.  Mild atherosclerotic calcifications of the aortic arch.  12 mm short axis prevascular node (series 2/ image 23). Additional prevascular nodal tissue (series 2/image 24).  No suspicious hilar or axillary lymphadenopathy.  Visualized thyroid is unremarkable.  Lungs/Pleura: 4.0 x 3.5 x 3.5 cm thick-walled cavitary lesion in the medial left upper lobe (series 2/image 19), with surrounding interlobular septal thickening/ ground-glass opacity, worrisome for recurrence/metastatic disease.  Radiation changes in the posterior right upper lobe/ paramediastinal region (series 5/image 17).  Underlying mild centrilobular and paraseptal emphysematous changes.  No pleural effusion or pneumothorax.  Upper abdomen: Visualized upper abdomen is unremarkable.  Musculoskeletal: Visualized osseous structures are within normal limits.  IMPRESSION: 4.0 cm thick-walled cavitary lesion in the medial left upper lobe, worrisome for recurrence/metastatic disease.  Associated prevascular nodal metastases.  Radiation changes in the posterior right upper lobe/paramediastinal region.   Electronically Signed   By: SJulian HyM.D.   On: 07/25/2014 11:12    ASSESSMENT AND PLAN: This is a very pleasant 64 years old [place] male with history of stage IIIa non-small cell lung cancer status post concurrent chemoradiation followed by 3 cycles of consolidation chemotherapy and has been observation since May of 2010 with no evidence for disease progression. The patient has been observation for the last 6 years with no evidence for progression except the recent finding on the CT scan of the chest performed on 07/25/2014 which showed 4.0 cm thick-walled cavitary lesion in the medial left upper lobe with associated prevascular nodal metastasis worrisome for disease recurrence. I discussed the scan results with the patient.  I recommended for him to complete the staging workup for his condition by ordering a PET scan as well as MRI of the brain. I would see the patient back for follow-up visit in 2 weeks for reevaluation and discussion of his scan results and further recommendation regarding treatment of his condition. The patient was advised to call immediately if he has any concerning symptoms in the interval.  All questions were answered. The patient knows to call the clinic with any problems, questions or concerns. We can certainly see the patient  much sooner if necessary.  Disclaimer: This note was dictated with voice recognition software. Similar sounding words can inadvertently be transcribed and may not be corrected upon review.

## 2014-08-02 ENCOUNTER — Telehealth: Payer: Self-pay | Admitting: Internal Medicine

## 2014-08-02 NOTE — Telephone Encounter (Signed)
per pof to sch pt appt-per reply from [facility] ok to use MD ONLY-cld & spoke to pt and gave pt time & date-pt understood

## 2014-08-07 ENCOUNTER — Other Ambulatory Visit: Payer: Medicare Other

## 2014-08-12 ENCOUNTER — Other Ambulatory Visit (HOSPITAL_BASED_OUTPATIENT_CLINIC_OR_DEPARTMENT_OTHER): Payer: Medicare Other

## 2014-08-12 ENCOUNTER — Other Ambulatory Visit: Payer: Self-pay | Admitting: Internal Medicine

## 2014-08-12 DIAGNOSIS — C3492 Malignant neoplasm of unspecified part of left bronchus or lung: Secondary | ICD-10-CM

## 2014-08-12 DIAGNOSIS — Z85118 Personal history of other malignant neoplasm of bronchus and lung: Secondary | ICD-10-CM

## 2014-08-12 LAB — CBC WITH DIFFERENTIAL/PLATELET
BASO%: 1.2 % (ref 0.0–2.0)
Basophils Absolute: 0.1 10*3/uL (ref 0.0–0.1)
EOS ABS: 0.4 10*3/uL (ref 0.0–0.5)
EOS%: 3.7 % (ref 0.0–7.0)
HEMATOCRIT: 36.7 % — AB (ref 38.4–49.9)
HGB: 12.2 g/dL — ABNORMAL LOW (ref 13.0–17.1)
LYMPH%: 12.6 % — AB (ref 14.0–49.0)
MCH: 28.1 pg (ref 27.2–33.4)
MCHC: 33.2 g/dL (ref 32.0–36.0)
MCV: 84.8 fL (ref 79.3–98.0)
MONO#: 0.5 10*3/uL (ref 0.1–0.9)
MONO%: 5.2 % (ref 0.0–14.0)
NEUT#: 7.5 10*3/uL — ABNORMAL HIGH (ref 1.5–6.5)
NEUT%: 77.3 % — AB (ref 39.0–75.0)
Platelets: 559 10*3/uL — ABNORMAL HIGH (ref 140–400)
RBC: 4.32 10*6/uL (ref 4.20–5.82)
RDW: 13.7 % (ref 11.0–14.6)
WBC: 9.7 10*3/uL (ref 4.0–10.3)
lymph#: 1.2 10*3/uL (ref 0.9–3.3)

## 2014-08-12 LAB — COMPREHENSIVE METABOLIC PANEL (CC13)
ALK PHOS: 97 U/L (ref 40–150)
ALT: 12 U/L (ref 0–55)
AST: 16 U/L (ref 5–34)
Albumin: 3 g/dL — ABNORMAL LOW (ref 3.5–5.0)
Anion Gap: 8 mEq/L (ref 3–11)
BUN: 8 mg/dL (ref 7.0–26.0)
CHLORIDE: 105 meq/L (ref 98–109)
CO2: 27 meq/L (ref 22–29)
Calcium: 9.8 mg/dL (ref 8.4–10.4)
Creatinine: 0.8 mg/dL (ref 0.7–1.3)
Glucose: 103 mg/dl (ref 70–140)
POTASSIUM: 4.2 meq/L (ref 3.5–5.1)
SODIUM: 139 meq/L (ref 136–145)
TOTAL PROTEIN: 7.8 g/dL (ref 6.4–8.3)
Total Bilirubin: 0.25 mg/dL (ref 0.20–1.20)

## 2014-08-14 ENCOUNTER — Ambulatory Visit: Payer: Medicare Other | Admitting: Internal Medicine

## 2014-08-16 ENCOUNTER — Ambulatory Visit (HOSPITAL_COMMUNITY)
Admission: RE | Admit: 2014-08-16 | Discharge: 2014-08-16 | Disposition: A | Discharge status: Home / Self Care | Payer: Medicare Other | Source: Ambulatory Visit | Attending: Internal Medicine | Admitting: Internal Medicine

## 2014-08-16 ENCOUNTER — Encounter (HOSPITAL_COMMUNITY)
Admission: RE | Admit: 2014-08-16 | Discharge: 2014-08-16 | Disposition: A | Discharge status: Home / Self Care | Payer: Medicare Other | Source: Ambulatory Visit | Attending: Internal Medicine | Admitting: Internal Medicine

## 2014-08-16 DIAGNOSIS — C3432 Malignant neoplasm of lower lobe, left bronchus or lung: Secondary | ICD-10-CM | POA: Diagnosis not present

## 2014-08-16 DIAGNOSIS — Z9221 Personal history of antineoplastic chemotherapy: Secondary | ICD-10-CM | POA: Insufficient documentation

## 2014-08-16 DIAGNOSIS — C3490 Malignant neoplasm of unspecified part of unspecified bronchus or lung: Secondary | ICD-10-CM | POA: Diagnosis not present

## 2014-08-16 DIAGNOSIS — Z923 Personal history of irradiation: Secondary | ICD-10-CM | POA: Diagnosis not present

## 2014-08-16 DIAGNOSIS — R51 Headache: Secondary | ICD-10-CM | POA: Insufficient documentation

## 2014-08-16 DIAGNOSIS — C3492 Malignant neoplasm of unspecified part of left bronchus or lung: Secondary | ICD-10-CM

## 2014-08-16 DIAGNOSIS — Z79899 Other long term (current) drug therapy: Secondary | ICD-10-CM | POA: Insufficient documentation

## 2014-08-16 LAB — GLUCOSE, CAPILLARY: Glucose-Capillary: 92 mg/dL (ref 65–99)

## 2014-08-16 MED ORDER — FLUDEOXYGLUCOSE F - 18 (FDG) INJECTION
6.0000 | Freq: Once | INTRAVENOUS | Status: AC | PRN
  Administered 2014-08-16: 6 via INTRAVENOUS

## 2014-08-16 MED ORDER — GADOBENATE DIMEGLUMINE 529 MG/ML IV SOLN
15.0000 mL | Freq: Once | INTRAVENOUS | Status: AC | PRN
  Administered 2014-08-16: 11 mL via INTRAVENOUS

## 2014-08-19 ENCOUNTER — Encounter: Payer: Self-pay | Admitting: Internal Medicine

## 2014-08-19 ENCOUNTER — Telehealth: Payer: Self-pay | Admitting: Internal Medicine

## 2014-08-19 ENCOUNTER — Telehealth: Payer: Self-pay | Admitting: Pulmonary Disease

## 2014-08-19 ENCOUNTER — Ambulatory Visit (HOSPITAL_BASED_OUTPATIENT_CLINIC_OR_DEPARTMENT_OTHER): Payer: Medicare Other | Admitting: Internal Medicine

## 2014-08-19 VITALS — BP 100/72 | HR 93 | Temp 97.3°F | Resp 17 | Ht 64.0 in | Wt 123.5 lb

## 2014-08-19 DIAGNOSIS — Z85118 Personal history of other malignant neoplasm of bronchus and lung: Secondary | ICD-10-CM | POA: Diagnosis not present

## 2014-08-19 DIAGNOSIS — C3412 Malignant neoplasm of upper lobe, left bronchus or lung: Secondary | ICD-10-CM | POA: Diagnosis not present

## 2014-08-19 DIAGNOSIS — C3492 Malignant neoplasm of unspecified part of left bronchus or lung: Secondary | ICD-10-CM

## 2014-08-19 NOTE — Progress Notes (Signed)
[facility] Telephone:([phone]) [phone]   Fax:(336) [place], [place] [address]  PRINCIPAL DIAGNOSIS: 1) new left upper lobe lung mass with left hilar adenopathy suspicious for lung cancer. 2)  Stage IIIA (T2 N2 Mx) non-small cell lung cancer, adenocarcinoma, involving the right upper lobe and mediastinal lymphadenopathy diagnosed in June 2010.   PRIOR THERAPY:  1. Status post concurrent chemoradiation with weekly carboplatin and paclitaxel; last dose was given April 08, 2008. 2. Status post 3 cycles of consolidation chemotherapy with docetaxel; last dose was given on Jun 26, 2008.  CURRENT THERAPY: Observation.  INTERVAL HISTORY: Evan [name] 64 y.o. male returns to the clinic today for followup visit. The patient is feeling fine today with no specific complaints except for the fatigue and weight loss. He was found on the recent CT scan of the chest to have new left upper lobe lung mass in addition to left hilar lymphadenopathy. I ordered several studies for evaluation of his disease including a PET scan as well as MRI of the brain and he is here today for evaluation and discussion of his scan results and further recommendation regarding his condition.. He he continues to complain of pain on the left anterior chest wall. The patient has no nausea or vomiting.   MEDICAL HISTORY: Past Medical History  Diagnosis Date  . Complication of anesthesia     " sometimes I wake up during surgery "  . Anxiety   . Depression   . Cancer     lung ca    ALLERGIES:  has No Known Allergies.  MEDICATIONS:  No current outpatient prescriptions on file.   No current facility-administered medications for this visit.    SURGICAL HISTORY:  Past Surgical History  Procedure Laterality Date  . Foot amputation Right     traumatic right lower extremity  . Amputation Right 05/21/2012    Procedure: AMPUTATION BELOW KNEE;   Surgeon: Newt Minion, MD;  Location: [facility];  Service: Orthopedics;  Laterality: Right;  . Upper jaw      SURGERY FOR INFECTION  . Colonoscopy w/ biopsies and polypectomy    . Amputation Right 06/21/2012    Procedure: Revision AMPUTATION BELOW KNEE Right;  Surgeon: Newt Minion, MD;  Location: [facility];  Service: Orthopedics;  Laterality: Right;  Revision right Below Knee Amputation    REVIEW OF SYSTEMS:  A comprehensive review of systems was negative except for: Constitutional: positive for anorexia, fatigue and weight loss Respiratory: positive for cough, dyspnea on exertion and pleurisy/chest pain   PHYSICAL EXAMINATION: General appearance: alert, cooperative and no distress Head: Normocephalic, without obvious abnormality, atraumatic Neck: no adenopathy Lymph nodes: Cervical, supraclavicular, and axillary nodes normal. Resp: clear to auscultation bilaterally Cardio: regular rate and rhythm, S1, S2 normal, no murmur, click, rub or gallop GI: soft, non-tender; bowel sounds normal; no masses,  no organomegaly Extremities: Right below knee amputation  ECOG PERFORMANCE STATUS: 1 - Symptomatic but completely ambulatory  Blood pressure 100/72, pulse 93, temperature 97.3 F (36.3 C), temperature source Oral, resp. rate 17, height '5\' 4"'$  (1.626 m), weight 123 lb 8 oz (56.019 kg), SpO2 100 %.  LABORATORY DATA: Lab Results  Component Value Date   WBC 9.7 08/12/2014   HGB 12.2* 08/12/2014   HCT 36.7* 08/12/2014   MCV 84.8 08/12/2014   PLT 559* 08/12/2014      Chemistry      Component  Value Date/Time   NA 139 08/12/2014 0854   NA 138 07/24/2013 1454   NA 139 07/20/2011 1312   K 4.2 08/12/2014 0854   K 4.4 07/24/2013 1454   K 4.8* 07/20/2011 1312   CL 101 07/24/2013 1454   CL 104 07/19/2012 1432   CL 102 07/20/2011 1312   CO2 27 08/12/2014 0854   CO2 23 07/24/2013 1454   CO2 28 07/20/2011 1312   BUN 8.0 08/12/2014 0854   BUN 11 07/24/2013 1454   BUN 9 07/20/2011 1312    CREATININE 0.8 08/12/2014 0854   CREATININE 0.86 07/24/2013 1454   CREATININE 1.3* 07/20/2011 1312      Component Value Date/Time   CALCIUM 9.8 08/12/2014 0854   CALCIUM 9.4 07/24/2013 1454   CALCIUM 8.9 07/20/2011 1312   ALKPHOS 97 08/12/2014 0854   ALKPHOS 113 07/24/2013 1454   ALKPHOS 88* 07/20/2011 1312   AST 16 08/12/2014 0854   AST 14 07/24/2013 1454   AST 21 07/20/2011 1312   ALT 12 08/12/2014 0854   ALT 14 07/24/2013 1454   ALT 27 07/20/2011 1312   BILITOT 0.25 08/12/2014 0854   BILITOT 0.4 07/24/2013 1454   BILITOT 0.60 07/20/2011 1312       RADIOGRAPHIC STUDIES: Ct Chest W Contrast  07/25/2014   CLINICAL DATA:  Right lung cancer diagnosed 2010, XRT/chemotherapy complete  EXAM: CT CHEST WITH CONTRAST  TECHNIQUE: Multidetector CT imaging of the chest was performed during intravenous contrast administration.  CONTRAST:  42m OMNIPAQUE IOHEXOL 300 MG/ML  SOLN  COMPARISON:  07/24/2013  FINDINGS: Mediastinum/Nodes: The heart is top-normal in size. No pericardial effusion.  Mild atherosclerotic calcifications of the aortic arch.  12 mm short axis prevascular node (series 2/ image 23). Additional prevascular nodal tissue (series 2/image 24).  No suspicious hilar or axillary lymphadenopathy.  Visualized thyroid is unremarkable.  Lungs/Pleura: 4.0 x 3.5 x 3.5 cm thick-walled cavitary lesion in the medial left upper lobe (series 2/image 19), with surrounding interlobular septal thickening/ ground-glass opacity, worrisome for recurrence/metastatic disease.  Radiation changes in the posterior right upper lobe/ paramediastinal region (series 5/image 17).  Underlying mild centrilobular and paraseptal emphysematous changes.  No pleural effusion or pneumothorax.  Upper abdomen: Visualized upper abdomen is unremarkable.  Musculoskeletal: Visualized osseous structures are within normal limits.  IMPRESSION: 4.0 cm thick-walled cavitary lesion in the medial left upper lobe, worrisome for  recurrence/metastatic disease.  Associated prevascular nodal metastases.  Radiation changes in the posterior right upper lobe/paramediastinal region.   Electronically Signed   By: SJulian HyM.D.   On: 07/25/2014 11:12   Mr BJeri CosWVQContrast  08/16/2014   CLINICAL DATA:  64year old male with. Lung cancer. Headaches. Staging. Subsequent encounter.  EXAM: MRI HEAD WITHOUT AND WITH CONTRAST  TECHNIQUE: Multiplanar, multiecho pulse sequences of the brain and surrounding structures were obtained without and with intravenous contrast.  CONTRAST:  154mMULTIHANCE GADOBENATE DIMEGLUMINE 529 MG/ML IV SOLN  COMPARISON:  Head CT without and with contrast 02/13/2008  FINDINGS: Mildly motion degraded post-contrast imaging. No abnormal enhancement identified. No midline shift, mass effect, or evidence of intracranial mass lesion. No dural thickening.  Normal cerebral volume. No restricted diffusion to suggest acute infarction. No ventriculomegaly, extra-axial collection or acute intracranial hemorrhage. Cervicomedullary junction and pituitary are within normal limits. Major intracranial vascular flow voids are preserved, dominant distal left vertebral artery. Tiny chronic lacunar infarct in the left cerebellum (series 6, image 9). Otherwise gray and white matter signal is within normal  limits for age throughout the brain.  Visible internal auditory structures appear normal. Mastoids are clear. Mild right maxillary sinus mucous retention cyst. Orbits soft tissues appear normal. Visualized scalp soft tissues are within normal limits. Grossly negative visualized cervical spinal cord. Normal visualized bone marrow signal.  IMPRESSION: No acute or metastatic intracranial abnormality.   Electronically Signed   By: Genevie Ann M.D.   On: 08/16/2014 13:38   Nm Pet Image Restag (ps) Skull Base To Thigh  08/16/2014   CLINICAL DATA:  Subsequent treatment strategy for lung carcinoma. Patient diagnosis stage IIIA lung carcinoma  2010. Patient status post radiotherapy and chemotherapy. Recent CT scan demonstrated concerning right upper lobe mass Send For  EXAM: NUCLEAR MEDICINE PET SKULL BASE TO THIGH  TECHNIQUE: 6.0 mCi F-18 FDG was injected intravenously. Full-ring PET imaging was performed from the skull base to thigh after the radiotracer. CT data was obtained and used for attenuation correction and anatomic localization.  FASTING BLOOD GLUCOSE:  Value: 92 mg/dl  COMPARISON:  CT 07/25/2014, 07/24/2013, PET-CT 02/09/2008  FINDINGS: NECK  No hypermetabolic lymph nodes in the neck.  CHEST  Hypermetabolic mass in the left upper lobe along the mediastinal border measures 3.9 x 3.9 cm with SUV max equal 27.8. There is a hypermetabolic suprahilar lymph node on image 69. There is a hypermetabolic prevascular lymph node with better seen on comparison CT of 07/25/2014.  No additional hypermetabolic activity in the thorax. No hypermetabolic activity associated with the perihilar linear consolidation bronchiectasis in the right lung.  ABDOMEN/PELVIS  No abnormal hypermetabolic activity within the liver, pancreas, adrenal glands, or spleen. No hypermetabolic lymph nodes in the abdomen or pelvis.  SKELETON  No focal hypermetabolic activity to suggest skeletal metastasis.  IMPRESSION: 1. Intensely hypermetabolic left upper lobe mass consistent with bronchogenic carcinoma. Mass abuts the pleural surface. 2. Hypermetabolic left hilar and prevascular lymph node 3. Stable right hilar perihilar consolidation and bronchiectasis without evidence of metabolic activity consists with benign radiation change. 4. No evidence distant metastatic disease.   Electronically Signed   By: Suzy Bouchard M.D.   On: 08/16/2014 12:12    ASSESSMENT AND PLAN: This is a very pleasant 64 years old [place] American male with history of stage IIIa non-small cell lung cancer status post concurrent chemoradiation followed by 3 cycles of consolidation chemotherapy and has been  observation since May of 2010 with no evidence for disease progression. The patient has been observation for the last 6 years with no evidence for progression but the recent CT scan of the chest followed by PET scan showed hypermetabolic left upper lobe lung mass consistent with bronchogenic carcinoma in addition to hypermetabolic left hilar and prevascular lymph node. The previously treated right upper lobe and right hilar lymphadenopathy showed no hypermetabolic activity consistent with good response to the previous concurrent chemoradiation. I discussed the imaging studies with the patient today. His MRI of the brain showed no evidence of metastatic disease to the brain. I would refer the patient to Dr. Elsworth Soho for evaluation and consideration of bronchoscopy with endobronchial ultrasound and biopsies. I would see the patient back for follow-up visit in 2 weeks for reevaluation and discussion of his scan results and further recommendation regarding treatment of his condition. The patient was advised to call immediately if he has any concerning symptoms in the interval.  All questions were answered. The patient knows to call the clinic with any problems, questions or concerns. We can certainly see the patient much sooner if necessary.  Disclaimer: This note was dictated with voice recognition software. Similar sounding words can inadvertently be transcribed and may not be corrected upon review.

## 2014-08-19 NOTE — Patient Instructions (Signed)
Smoking Cessation Quitting smoking is important to your health and has many advantages. However, it is not always easy to quit since nicotine is a very addictive drug. Oftentimes, people try 3 times or more before being able to quit. This document explains the best ways for you to prepare to quit smoking. Quitting takes hard work and a lot of effort, but you can do it. ADVANTAGES OF QUITTING SMOKING  You will live longer, feel better, and live better.  Your body will feel the impact of quitting smoking almost immediately.  Within 20 minutes, blood pressure decreases. Your pulse returns to its normal level.  After 8 hours, carbon monoxide levels in the blood return to normal. Your oxygen level increases.  After 24 hours, the chance of having a heart attack starts to decrease. Your breath, hair, and body stop smelling like smoke.  After 48 hours, damaged nerve endings begin to recover. Your sense of taste and smell improve.  After 72 hours, the body is virtually free of nicotine. Your bronchial tubes relax and breathing becomes easier.  After 2 to 12 weeks, lungs can hold more air. Exercise becomes easier and circulation improves.  The risk of having a heart attack, stroke, cancer, or lung disease is greatly reduced.  After 1 year, the risk of coronary heart disease is cut in half.  After 5 years, the risk of stroke falls to the same as a nonsmoker.  After 10 years, the risk of lung cancer is cut in half and the risk of other cancers decreases significantly.  After 15 years, the risk of coronary heart disease drops, usually to the level of a nonsmoker.  If you are pregnant, quitting smoking will improve your chances of having a healthy baby.  The people you live with, especially any children, will be healthier.  You will have extra money to spend on things other than cigarettes. QUESTIONS TO THINK ABOUT BEFORE ATTEMPTING TO QUIT You may want to talk about your answers with your  health care provider.  Why do you want to quit?  If you tried to quit in the past, what helped and what did not?  What will be the most difficult situations for you after you quit? How will you plan to handle them?  Who can help you through the tough times? Your family? Friends? A health care provider?  What pleasures do you get from smoking? What ways can you still get pleasure if you quit? Here are some questions to ask your health care provider:  How can you help me to be successful at quitting?  What medicine do you think would be best for me and how should I take it?  What should I do if I need more help?  What is smoking withdrawal like? How can I get information on withdrawal? GET READY  Set a quit date.  Change your environment by getting rid of all cigarettes, ashtrays, matches, and lighters in your home, car, or work. Do not let people smoke in your home.  Review your past attempts to quit. Think about what worked and what did not. GET SUPPORT AND ENCOURAGEMENT You have a better chance of being successful if you have help. You can get support in many ways.  Tell your family, friends, and coworkers that you are going to quit and need their support. Ask them not to smoke around you.  Get individual, group, or telephone counseling and support. Programs are available at local hospitals and health centers. Call   your local health department for information about programs in your area.  Spiritual beliefs and practices may help some smokers quit.  Download a "quit meter" on your computer to keep track of quit statistics, such as how long you have gone without smoking, cigarettes not smoked, and money saved.  Get a self-help book about quitting smoking and staying off tobacco. LEARN NEW SKILLS AND BEHAVIORS  Distract yourself from urges to smoke. Talk to someone, go for a walk, or occupy your time with a task.  Change your normal routine. Take a different route to work.  Drink tea instead of coffee. Eat breakfast in a different place.  Reduce your stress. Take a hot bath, exercise, or read a book.  Plan something enjoyable to do every day. Reward yourself for not smoking.  Explore interactive web-based programs that specialize in helping you quit. GET MEDICINE AND USE IT CORRECTLY Medicines can help you stop smoking and decrease the urge to smoke. Combining medicine with the above behavioral methods and support can greatly increase your chances of successfully quitting smoking.  Nicotine replacement therapy helps deliver nicotine to your body without the negative effects and risks of smoking. Nicotine replacement therapy includes nicotine gum, lozenges, inhalers, nasal sprays, and skin patches. Some may be available over-the-counter and others require a prescription.  Antidepressant medicine helps people abstain from smoking, but how this works is unknown. This medicine is available by prescription.  Nicotinic receptor partial agonist medicine simulates the effect of nicotine in your brain. This medicine is available by prescription. Ask your health care provider for advice about which medicines to use and how to use them based on your health history. Your health care provider will tell you what side effects to look out for if you choose to be on a medicine or therapy. Carefully read the information on the package. Do not use any other product containing nicotine while using a nicotine replacement product.  RELAPSE OR DIFFICULT SITUATIONS Most relapses occur within the first 3 months after quitting. Do not be discouraged if you start smoking again. Remember, most people try several times before finally quitting. You may have symptoms of withdrawal because your body is used to nicotine. You may crave cigarettes, be irritable, feel very hungry, cough often, get headaches, or have difficulty concentrating. The withdrawal symptoms are only temporary. They are strongest  when you first quit, but they will go away within 10-14 days. To reduce the chances of relapse, try to:  Avoid drinking alcohol. Drinking lowers your chances of successfully quitting.  Reduce the amount of caffeine you consume. Once you quit smoking, the amount of caffeine in your body increases and can give you symptoms, such as a rapid heartbeat, sweating, and anxiety.  Avoid smokers because they can make you want to smoke.  Do not let weight gain distract you. Many smokers will gain weight when they quit, usually less than 10 pounds. Eat a healthy diet and stay active. You can always lose the weight gained after you quit.  Find ways to improve your mood other than smoking. FOR MORE INFORMATION  www.smokefree.gov  Document Released: 01/12/2001 Document Revised: 06/04/2013 Document Reviewed: 04/29/2011 ExitCare Patient Information 2015 [employer]. This information is not intended to replace advice given to you by your health care provider. Make sure you discuss any questions you have with your health care provider.  

## 2014-08-19 NOTE — Telephone Encounter (Signed)
Gave and printed appt sched adna vs for pt for Aug..Sw. Pulmanary..Dr.  MM emailed Dr. Elsworth Soho adn his office will call me and pt with new d.t

## 2014-08-19 NOTE — Telephone Encounter (Signed)
Called and spoke to pt. Appt made with RA on 7.20.16 at 1330. Pt verbalized understanding and denied any further questions or concerns at this time. (per Sharyn Lull ok to DB per RA)  LMTCB for [facility] at Dr. Worthy Flank office.

## 2014-08-20 NOTE — Telephone Encounter (Signed)
Spoke with [facility] and advised of pt's appt with Dr Elsworth Soho on 08/21/14.

## 2014-08-21 ENCOUNTER — Encounter: Payer: Self-pay | Admitting: Pulmonary Disease

## 2014-08-21 ENCOUNTER — Ambulatory Visit (INDEPENDENT_AMBULATORY_CARE_PROVIDER_SITE_OTHER): Payer: Medicare Other | Admitting: Pulmonary Disease

## 2014-08-21 VITALS — BP 128/64 | HR 84 | Ht 65.0 in | Wt 126.0 lb

## 2014-08-21 DIAGNOSIS — J449 Chronic obstructive pulmonary disease, unspecified: Secondary | ICD-10-CM

## 2014-08-21 DIAGNOSIS — J984 Other disorders of lung: Secondary | ICD-10-CM | POA: Insufficient documentation

## 2014-08-21 DIAGNOSIS — R918 Other nonspecific abnormal finding of lung field: Secondary | ICD-10-CM
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
## 2014-08-21 NOTE — Patient Instructions (Signed)
We reviewed lung mass, difficult location & biopsy options Will discuss with radiologist if they can do biopsy

## 2014-08-22 ENCOUNTER — Telehealth: Payer: Self-pay | Admitting: Pulmonary Disease

## 2014-08-22 NOTE — Telephone Encounter (Signed)
Please let him know that I discussed with radiology and have placed a request to proceed with CT-guided biopsy

## 2014-08-22 NOTE — Assessment & Plan Note (Signed)
Unfortunately, left upper lobe mass seems to indicate a new malignancy. The prevascular lymph node is not reachable by ebus, it would be difficult to navigate to the left upper lobe mass-and I do not see an airway close to it on the CT scan. I discussed with radiology-CT-guided biopsy of left upper lobe mass may be the best approach, that would be a 10% chance of pneumothorax or bleeding and I discussed these complications with the patient in detail.

## 2014-08-22 NOTE — Telephone Encounter (Signed)
Patient notified. Patient scheduled to have biopsy done on 08/28/2014 Nothing further needed.

## 2014-08-22 NOTE — Assessment & Plan Note (Signed)
Perform spirometry testing in the future. He denies any dyspnea at present-does not need any broncho-dilators. He does have radiation changes to right upper lobe

## 2014-08-28 ENCOUNTER — Ambulatory Visit (HOSPITAL_COMMUNITY): Admission: RE | Admit: 2014-08-28 | Payer: Medicare Other | Source: Ambulatory Visit

## 2014-08-28 ENCOUNTER — Ambulatory Visit (HOSPITAL_COMMUNITY): Payer: Medicare Other

## 2014-08-29 ENCOUNTER — Other Ambulatory Visit: Payer: Self-pay | Admitting: Radiology

## 2014-08-30 ENCOUNTER — Encounter (HOSPITAL_COMMUNITY): Payer: Self-pay

## 2014-08-30 ENCOUNTER — Other Ambulatory Visit: Payer: Self-pay | Admitting: *Deleted

## 2014-08-30 ENCOUNTER — Ambulatory Visit (HOSPITAL_COMMUNITY): Payer: Medicare Other

## 2014-08-30 ENCOUNTER — Ambulatory Visit (HOSPITAL_COMMUNITY)
Admission: RE | Admit: 2014-08-30 | Discharge: 2014-08-30 | Disposition: A | Discharge status: Home / Self Care | Payer: Medicare Other | Source: Ambulatory Visit | Attending: Pulmonary Disease | Admitting: Pulmonary Disease

## 2014-08-30 DIAGNOSIS — Z833 Family history of diabetes mellitus: Secondary | ICD-10-CM | POA: Insufficient documentation

## 2014-08-30 DIAGNOSIS — Z9221 Personal history of antineoplastic chemotherapy: Secondary | ICD-10-CM | POA: Diagnosis not present

## 2014-08-30 DIAGNOSIS — J984 Other disorders of lung: Secondary | ICD-10-CM

## 2014-08-30 DIAGNOSIS — Z8249 Family history of ischemic heart disease and other diseases of the circulatory system: Secondary | ICD-10-CM | POA: Insufficient documentation

## 2014-08-30 DIAGNOSIS — Z8042 Family history of malignant neoplasm of prostate: Secondary | ICD-10-CM | POA: Insufficient documentation

## 2014-08-30 DIAGNOSIS — Z8673 Personal history of transient ischemic attack (TIA), and cerebral infarction without residual deficits: Secondary | ICD-10-CM | POA: Insufficient documentation

## 2014-08-30 DIAGNOSIS — Z87891 Personal history of nicotine dependence: Secondary | ICD-10-CM | POA: Diagnosis not present

## 2014-08-30 DIAGNOSIS — C3412 Malignant neoplasm of upper lobe, left bronchus or lung: Secondary | ICD-10-CM | POA: Diagnosis not present

## 2014-08-30 DIAGNOSIS — R918 Other nonspecific abnormal finding of lung field: Secondary | ICD-10-CM | POA: Diagnosis not present

## 2014-08-30 LAB — CBC
HCT: 35.1 % — ABNORMAL LOW (ref 39.0–52.0)
HEMOGLOBIN: 11.7 g/dL — AB (ref 13.0–17.0)
MCH: 27.6 pg (ref 26.0–34.0)
MCHC: 33.3 g/dL (ref 30.0–36.0)
MCV: 82.8 fL (ref 78.0–100.0)
Platelets: 680 10*3/uL — ABNORMAL HIGH (ref 150–400)
RBC: 4.24 MIL/uL (ref 4.22–5.81)
RDW: 15.5 % (ref 11.5–15.5)
WBC: 9 10*3/uL (ref 4.0–10.5)

## 2014-08-30 LAB — PROTIME-INR
INR: 1.03 (ref 0.00–1.49)
Prothrombin Time: 13.7 seconds (ref 11.6–15.2)

## 2014-08-30 LAB — APTT: aPTT: 36 seconds (ref 24–37)

## 2014-08-30 MED ORDER — HYDROCODONE-ACETAMINOPHEN 5-325 MG PO TABS
1.0000 | ORAL_TABLET | ORAL | Status: DC | PRN
  Filled 2014-08-30: qty 1

## 2014-08-30 MED ORDER — FENTANYL CITRATE (PF) 100 MCG/2ML IJ SOLN
INTRAMUSCULAR | Status: AC
  Filled 2014-08-30: qty 4

## 2014-08-30 MED ORDER — MIDAZOLAM HCL 2 MG/2ML IJ SOLN
INTRAMUSCULAR | Status: AC
  Filled 2014-08-30: qty 4

## 2014-08-30 MED ORDER — HYDROCODONE-ACETAMINOPHEN 7.5-325 MG PO TABS
1.0000 | ORAL_TABLET | ORAL | Status: DC | PRN
  Filled 2014-08-30: qty 1

## 2014-08-30 MED ORDER — MIDAZOLAM HCL 2 MG/2ML IJ SOLN
INTRAMUSCULAR | Status: AC | PRN
  Administered 2014-08-30: 1 mg via INTRAVENOUS

## 2014-08-30 MED ORDER — SODIUM CHLORIDE 0.9 % IV SOLN
INTRAVENOUS | Status: DC
  Administered 2014-08-30: 11:00:00 via INTRAVENOUS

## 2014-08-30 MED ORDER — FENTANYL CITRATE (PF) 100 MCG/2ML IJ SOLN
INTRAMUSCULAR | Status: AC | PRN
  Administered 2014-08-30: 50 ug via INTRAVENOUS

## 2014-08-30 MED ORDER — LIDOCAINE HCL (PF) 1 % IJ SOLN
INTRAMUSCULAR | Status: AC
  Filled 2014-08-30: qty 30

## 2014-08-30 NOTE — Procedures (Signed)
Successful medial LUL MASS 18 G CORE BX NO COMP STABLE FULL REPORT IN PACS PATH PENDING

## 2014-08-30 NOTE — Discharge Instructions (Signed)
Needle Biopsy of Lung, Care After °Refer to this sheet in the next few weeks. These instructions provide you with information on caring for yourself after your procedure. Your health care provider may also give you more specific instructions. Your treatment has been planned according to current medical practices, but problems sometimes occur. Call your health care provider if you have any problems or questions after your procedure. °WHAT TO EXPECT AFTER THE PROCEDURE °· A bandage will be applied over the area where the needle was inserted. You may be asked to apply pressure to the bandage for several minutes to ensure there is minimal bleeding. °· In most cases, you can leave when your needle biopsy procedure is completed. Do not drive yourself home. Someone else should take you home. °· If you received an IV sedative or general anesthetic, you will be taken to a comfortable place to relax while the medicine wears off. °· If you have upcoming travel scheduled, talk to your health care provider about when it is safe to travel by air after the procedure. °HOME CARE INSTRUCTIONS °· Expect to take it easy for the rest of the day. °· Protect the area where you received the needle biopsy by keeping the bandage in place for as long as instructed. °· You may feel some mild pain or discomfort in the area, but this should stop in a day or two. °· Take medicines only as directed by your health care provider. °SEEK MEDICAL CARE IF:  °· You have pain at the biopsy site that worsens or is not helped by medicine. °· You have swelling or drainage at the needle biopsy site. °· You have a fever. °SEEK IMMEDIATE MEDICAL CARE IF:  °· You have new or worsening shortness of breath. °· You have chest pain. °· You are coughing up blood. °· You have bleeding that does not stop with pressure or a bandage. °· You develop light-headedness or fainting. °Document Released: 11/15/2006 Document Revised: 06/04/2013 Document Reviewed:  06/12/2012 °Exit[employer]® Patient Information ©2015 [employer]. This information is not intended to replace advice given to you by your health care provider. Make sure you discuss any questions you have with your health care provider. ° °

## 2014-08-30 NOTE — H&P (Signed)
Chief Complaint: Patient was seen in consultation today for CT guided biopsy of left upper lobe lung mass at the request of Alva,Rakesh V  Referring Physician(s): Alva,Rakesh V  History of Present Illness: Evan [name] is a 64 y.o. male here today for CT guided biopsy of his left upper lobe mass that is worrisome for recurrent malignancy.  He had a history of Stage 3 non-small cell lung cancer back in 2010 and underwent chemotherapy at that time and as of 2015, he was in remission.  He was just seen in June by Dr. Julien Nordmann and at that time was complaining of increasing fatigue, lack of appetite as well as weight loss of around 20 pounds since his visit year ago.  Today he reports to me that he can walk to the back of the grocery store without getting short of breath.    He denies any fevers, chill, or recent illnesses.  He is not taking any blood thinners.   Past Medical History  Diagnosis Date  . Complication of anesthesia     " sometimes I wake up during surgery "  . Anxiety   . Depression   . Cancer     lung ca    Past Surgical History  Procedure Laterality Date  . Foot amputation Right     traumatic right lower extremity  . Amputation Right 05/21/2012    Procedure: AMPUTATION BELOW KNEE;  Surgeon: Newt Minion, MD;  Location: [place];  Service: Orthopedics;  Laterality: Right;  . Upper jaw      SURGERY FOR INFECTION  . Colonoscopy w/ biopsies and polypectomy    . Amputation Right 06/21/2012    Procedure: Revision AMPUTATION BELOW KNEE Right;  Surgeon: Newt Minion, MD;  Location: [place];  Service: Orthopedics;  Laterality: Right;  Revision right Below Knee Amputation    Allergies: Review of patient's allergies indicates no known allergies.  Medications: Prior to Admission medications   Medication Sig Start Date End Date Taking? Authorizing Provider  aspirin-acetaminophen-caffeine (EXCEDRIN EXTRA STRENGTH) [phone] MG per tablet Take 1-2 tablets by mouth 3  (three) times daily as needed for headache (shoulder pain).    Historical Provider, MD  OVER THE COUNTER MEDICATION Take 480 mLs by mouth daily. Joint juice    Historical Provider, MD     Family History  Problem Relation Age of Onset  . Heart disease Mother   . Prostate cancer Father   . Diabetes Mother     History   Social History  . Marital Status: Single    Spouse Name: N/A  . Number of Children: N/A  . Years of Education: N/A   Social History Main Topics  . Smoking status: Former Smoker -- 0.50 packs/day for 45 years    Types: Cigarettes    Quit date: 06/21/2014  . Smokeless tobacco: Never Used  . Alcohol Use: No     Comment: rare  . Drug Use: No  . Sexual Activity: Not on file   Other Topics Concern  . None   Social History Narrative    ECOG Status: 1 - Symptomatic but completely ambulatory   Review of Systems  Constitutional: Positive for activity change and appetite change. Negative for chills and fatigue.  Respiratory: Positive for cough and shortness of breath. Negative for apnea.   Cardiovascular: Negative for chest pain.  Gastrointestinal: Negative for nausea, vomiting and abdominal pain.  Musculoskeletal: Negative.   Skin: Negative.   Neurological: Negative.  Psychiatric/Behavioral: Negative.     Vital Signs: BP 123/69 mmHg  Pulse 88  Temp(Src) 97.8 F (36.6 C)  Resp 20  Ht '5\' 5"'$  (1.651 m)  Wt 126 lb (57.153 kg)  BMI 20.97 kg/m2  SpO2 100%  Physical Exam  Constitutional: He is oriented to person, place, and time. He appears well-developed and well-nourished.  HENT:  Head: Normocephalic and atraumatic.  Eyes: EOM are normal.  Neck: Normal range of motion. Neck supple.  Cardiovascular: Normal rate, regular rhythm and normal heart sounds.   Pulmonary/Chest: Effort normal and breath sounds normal.  Abdominal: Soft. Bowel sounds are normal. He exhibits no distension. There is no tenderness.  Musculoskeletal: Normal range of motion.    Neurological: He is alert and oriented to person, place, and time.  Skin: Skin is warm and dry.  Psychiatric: He has a normal mood and affect. His behavior is normal. Judgment and thought content normal.  Vitals reviewed.   Mallampati Score:  MD Evaluation Airway: WNL Heart: WNL Abdomen: WNL Chest/ Lungs: WNL ASA  Classification: 3 Mallampati/Airway Score: Two  Imaging: Mr Kizzie Fantasia Contrast  08/16/2014   CLINICAL DATA:  64 year old male with. Lung cancer. Headaches. Staging. Subsequent encounter.  EXAM: MRI HEAD WITHOUT AND WITH CONTRAST  TECHNIQUE: Multiplanar, multiecho pulse sequences of the brain and surrounding structures were obtained without and with intravenous contrast.  CONTRAST:  54m MULTIHANCE GADOBENATE DIMEGLUMINE 529 MG/ML IV SOLN  COMPARISON:  Head CT without and with contrast 02/13/2008  FINDINGS: Mildly motion degraded post-contrast imaging. No abnormal enhancement identified. No midline shift, mass effect, or evidence of intracranial mass lesion. No dural thickening.  Normal cerebral volume. No restricted diffusion to suggest acute infarction. No ventriculomegaly, extra-axial collection or acute intracranial hemorrhage. Cervicomedullary junction and pituitary are within normal limits. Major intracranial vascular flow voids are preserved, dominant distal left vertebral artery. Tiny chronic lacunar infarct in the left cerebellum (series 6, image 9). Otherwise gray and white matter signal is within normal limits for age throughout the brain.  Visible internal auditory structures appear normal. Mastoids are clear. Mild right maxillary sinus mucous retention cyst. Orbits soft tissues appear normal. Visualized scalp soft tissues are within normal limits. Grossly negative visualized cervical spinal cord. Normal visualized bone marrow signal.  IMPRESSION: No acute or metastatic intracranial abnormality.   Electronically Signed   By: HGenevie AnnM.D.   On: 08/16/2014 13:38   Nm Pet  Image Restag (ps) Skull Base To Thigh  08/16/2014   CLINICAL DATA:  Subsequent treatment strategy for lung carcinoma. Patient diagnosis stage IIIA lung carcinoma 2010. Patient status post radiotherapy and chemotherapy. Recent CT scan demonstrated concerning right upper lobe mass Send For  EXAM: NUCLEAR MEDICINE PET SKULL BASE TO THIGH  TECHNIQUE: 6.0 mCi F-18 FDG was injected intravenously. Full-ring PET imaging was performed from the skull base to thigh after the radiotracer. CT data was obtained and used for attenuation correction and anatomic localization.  FASTING BLOOD GLUCOSE:  Value: 92 mg/dl  COMPARISON:  CT 07/25/2014, 07/24/2013, PET-CT 02/09/2008  FINDINGS: NECK  No hypermetabolic lymph nodes in the neck.  CHEST  Hypermetabolic mass in the left upper lobe along the mediastinal border measures 3.9 x 3.9 cm with SUV max equal 27.8. There is a hypermetabolic suprahilar lymph node on image 69. There is a hypermetabolic prevascular lymph node with better seen on comparison CT of 07/25/2014.  No additional hypermetabolic activity in the thorax. No hypermetabolic activity associated with the perihilar linear consolidation bronchiectasis in  the right lung.  ABDOMEN/PELVIS  No abnormal hypermetabolic activity within the liver, pancreas, adrenal glands, or spleen. No hypermetabolic lymph nodes in the abdomen or pelvis.  SKELETON  No focal hypermetabolic activity to suggest skeletal metastasis.  IMPRESSION: 1. Intensely hypermetabolic left upper lobe mass consistent with bronchogenic carcinoma. Mass abuts the pleural surface. 2. Hypermetabolic left hilar and prevascular lymph node 3. Stable right hilar perihilar consolidation and bronchiectasis without evidence of metabolic activity consists with benign radiation change. 4. No evidence distant metastatic disease.   Electronically Signed   By: Suzy Bouchard M.D.   On: 08/16/2014 12:12    Labs:  CBC:  Recent Labs  07/25/14 0817 08/12/14 0854  08/30/14 1015  WBC 8.2 9.7 9.0  HGB 12.5* 12.2* 11.7*  HCT 38.8 36.7* 35.1*  PLT 516* 559* 680*    COAGS: No results for input(s): INR, APTT in the last 8760 hours.  BMP:  Recent Labs  07/25/14 0817 08/12/14 0854  NA 138 139  K 4.2 4.2  CO2 26 27  GLUCOSE 102 103  BUN 7.3 8.0  CALCIUM 9.3 9.8  CREATININE 0.8 0.8    LIVER FUNCTION TESTS:  Recent Labs  07/25/14 0817 08/12/14 0854  BILITOT 0.30 0.25  AST 12 16  ALT 9 12  ALKPHOS 97 97  PROT 7.6 7.8  ALBUMIN 3.0* 3.0*    TUMOR MARKERS: No results for input(s): AFPTM, CEA, CA199, CHROMGRNA in the last 8760 hours.  Assessment and Plan:  Left upper lobe lung mass worrisome for recurrent lung cancer.  Will proceed with CT guided biopsy of the mass today by Dr. Annamaria Boots  Risks and Benefits discussed with the patient including, but not limited to bleeding, hemoptysis, respiratory failure requiring intubation, infection, pneumothorax requiring chest tube placement, stroke from air embolism or even death.  All of the patient's questions were answered, patient is agreeable to proceed. Consent signed and in chart.  Thank you for this interesting consult.  I greatly enjoyed meeting Evan [name] and look forward to participating in their care.  A copy of this report was sent to the requesting provider on this date.  Signed: Murrell Redden PA-C 08/30/2014, 10:50 AM   I spent a total of  30 Minutes  in face to face in clinical consultation, greater than 50% of which was counseling/coordinating care for CT guided biopsy of the left upper lobe mass.

## 2014-09-02 ENCOUNTER — Encounter: Payer: Self-pay | Admitting: Physician Assistant

## 2014-09-02 ENCOUNTER — Ambulatory Visit (HOSPITAL_BASED_OUTPATIENT_CLINIC_OR_DEPARTMENT_OTHER): Payer: Medicare Other | Admitting: Physician Assistant

## 2014-09-02 ENCOUNTER — Other Ambulatory Visit (HOSPITAL_BASED_OUTPATIENT_CLINIC_OR_DEPARTMENT_OTHER): Payer: Medicare Other

## 2014-09-02 ENCOUNTER — Telehealth: Payer: Self-pay | Admitting: Internal Medicine

## 2014-09-02 VITALS — BP 120/82 | HR 85 | Temp 98.0°F | Resp 18 | Ht 65.0 in | Wt 124.3 lb

## 2014-09-02 DIAGNOSIS — R630 Anorexia: Secondary | ICD-10-CM | POA: Diagnosis not present

## 2014-09-02 DIAGNOSIS — R079 Chest pain, unspecified: Secondary | ICD-10-CM | POA: Diagnosis not present

## 2014-09-02 DIAGNOSIS — Z85118 Personal history of other malignant neoplasm of bronchus and lung: Secondary | ICD-10-CM | POA: Diagnosis not present

## 2014-09-02 DIAGNOSIS — R5383 Other fatigue: Secondary | ICD-10-CM | POA: Diagnosis not present

## 2014-09-02 DIAGNOSIS — C3492 Malignant neoplasm of unspecified part of left bronchus or lung: Secondary | ICD-10-CM

## 2014-09-02 DIAGNOSIS — C3412 Malignant neoplasm of upper lobe, left bronchus or lung: Secondary | ICD-10-CM

## 2014-09-02 LAB — COMPREHENSIVE METABOLIC PANEL (CC13)
ALBUMIN: 3.2 g/dL — AB (ref 3.5–5.0)
ALT: 11 U/L (ref 0–55)
AST: 12 U/L (ref 5–34)
Alkaline Phosphatase: 99 U/L (ref 40–150)
Anion Gap: 6 mEq/L (ref 3–11)
BUN: 8.6 mg/dL (ref 7.0–26.0)
CO2: 28 meq/L (ref 22–29)
CREATININE: 0.8 mg/dL (ref 0.7–1.3)
Calcium: 9.6 mg/dL (ref 8.4–10.4)
Chloride: 105 mEq/L (ref 98–109)
EGFR: >90 mL/min/{1.73_m2} (ref >90)
Glucose: 108 mg/dl (ref 70–140)
POTASSIUM: 4.3 meq/L (ref 3.5–5.1)
Sodium: 139 mEq/L (ref 136–145)
Total Protein: 8.2 g/dL (ref 6.4–8.3)

## 2014-09-02 LAB — CBC WITH DIFFERENTIAL/PLATELET
BASO%: 1 % (ref 0.0–2.0)
Basophils Absolute: 0.1 10*3/uL (ref 0.0–0.1)
EOS%: 3 % (ref 0.0–7.0)
Eosinophils Absolute: 0.3 10*3/uL (ref 0.0–0.5)
HCT: 37.9 % — ABNORMAL LOW (ref 38.4–49.9)
HEMOGLOBIN: 12.4 g/dL — AB (ref 13.0–17.1)
LYMPH#: 2.2 10*3/uL (ref 0.9–3.3)
LYMPH%: 23.6 % (ref 14.0–49.0)
MCH: 27.7 pg (ref 27.2–33.4)
MCHC: 32.8 g/dL (ref 32.0–36.0)
MCV: 84.4 fL (ref 79.3–98.0)
MONO#: 0.6 10*3/uL (ref 0.1–0.9)
MONO%: 6.1 % (ref 0.0–14.0)
NEUT#: 6.2 10*3/uL (ref 1.5–6.5)
NEUT%: 66.3 % (ref 39.0–75.0)
Platelets: 744 10*3/uL — ABNORMAL HIGH (ref 140–400)
RBC: 4.49 10*6/uL (ref 4.20–5.82)
RDW: 16 % — ABNORMAL HIGH (ref 11.0–14.6)
WBC: 9.4 10*3/uL (ref 4.0–10.3)

## 2014-09-02 MED ORDER — HYDROCODONE-ACETAMINOPHEN 5-325 MG PO TABS: 1.0000 | ORAL_TABLET | Freq: Four times a day (QID) | ORAL | Status: DC | PRN

## 2014-09-02 NOTE — Telephone Encounter (Signed)
Appointments made and avs printed for patient °

## 2014-09-02 NOTE — Patient Instructions (Signed)
Follow-up plan within the next week to discuss the results of your recent biopsy and treatment options Take the Vicodin as prescribed only as needed for moderate to severe pain

## 2014-09-02 NOTE — Progress Notes (Addendum)
Evan [name]   Fax:([phone]) [place], [place] [address]  PRINCIPAL DIAGNOSIS: 1) new left upper lobe lung mass with left hilar adenopathy suspicious for lung cancer. 2)  Stage IIIA (T2 N2 Mx) non-small cell lung cancer, adenocarcinoma, involving the right upper lobe and mediastinal lymphadenopathy diagnosed in June 2010.   PRIOR THERAPY:  1. Status post concurrent chemoradiation with weekly carboplatin and paclitaxel; last dose was given April 08, 2008. 2. Status post 3 cycles of consolidation chemotherapy with docetaxel; last dose was given on Jun 26, 2008.  CURRENT THERAPY: Observation.  INTERVAL HISTORY: Evan [name] 64 y.o. male returns to the clinic today for followup visit. He recently (08/30/2014) underwent a core needle biopsy of the left upper lobe mass. Pathology results are pending. He complains of pain in the left anterior chest area also affecting his left upper extremity. He denies any previous cardiac history. He has been unable to manage his pain completely with extra strength Tylenol. The patient is feeling fine today with no specific complaints except for the fatigue and weight loss.  The patient has no nausea or vomiting.   MEDICAL HISTORY: Past Medical History  Diagnosis Date  . Complication of anesthesia     " sometimes I wake up during surgery "  . Anxiety   . Depression   . Cancer     lung ca    ALLERGIES:  has No Known Allergies.  MEDICATIONS:  Current Outpatient Prescriptions  Medication Sig Dispense Refill  . OVER THE COUNTER MEDICATION Take 480 mLs by mouth daily. Joint juice    . aspirin-acetaminophen-caffeine (EXCEDRIN EXTRA STRENGTH) 250-250-65 MG per tablet Take 1-2 tablets by mouth 3 (three) times daily as needed for headache (shoulder pain).    [name] HYDROcodone-acetaminophen (NORCO/VICODIN) 5-325 MG per tablet Take 1 tablet by mouth every 6  (six) hours as needed for moderate pain. 30 tablet 0   No current facility-administered medications for this visit.    SURGICAL HISTORY:  Past Surgical History  Procedure Laterality Date  . Foot amputation Right     traumatic right lower extremity  . Amputation Right 05/21/2012    Procedure: AMPUTATION BELOW KNEE;  Surgeon: Newt Minion, MD;  Location: [facility];  Service: Orthopedics;  Laterality: Right;  . Upper jaw      SURGERY FOR INFECTION  . Colonoscopy w/ biopsies and polypectomy    . Amputation Right 06/21/2012    Procedure: Revision AMPUTATION BELOW KNEE Right;  Surgeon: Newt Minion, MD;  Location: [facility];  Service: Orthopedics;  Laterality: Right;  Revision right Below Knee Amputation    REVIEW OF SYSTEMS:  A comprehensive review of systems was negative except for: Constitutional: positive for anorexia, fatigue and weight loss Respiratory: positive for cough, dyspnea on exertion and pleurisy/chest pain   PHYSICAL EXAMINATION: General appearance: alert, cooperative and no distress Head: Normocephalic, without obvious abnormality, atraumatic Neck: no adenopathy Lymph nodes: Cervical, supraclavicular, and axillary nodes normal. Resp: clear to auscultation bilaterally Cardio: regular rate and rhythm, S1, S2 normal, no murmur, click, rub or gallop GI: soft, non-tender; bowel sounds normal; no masses,  no organomegaly Extremities: Right below knee amputation  ECOG PERFORMANCE STATUS: 1 - Symptomatic but completely ambulatory  Blood pressure 120/82, pulse 85, temperature 98 F (36.7 C), temperature source Oral, resp. rate 18, height '5\' 5"'$  (1.651 m), weight 124 lb 4.8 oz (56.382 kg), SpO2 100 %.  LABORATORY DATA: Lab Results  Component Value Date   WBC 9.4 09/02/2014   HGB 12.4* 09/02/2014   HCT 37.9* 09/02/2014   MCV 84.4 09/02/2014   PLT 744* 09/02/2014      Chemistry      Component Value Date/Time   NA 139 09/02/2014 1030   NA 138 07/24/2013 1454   NA 139  07/20/2011 1312   K 4.3 09/02/2014 1030   K 4.4 07/24/2013 1454   K 4.8* 07/20/2011 1312   CL 101 07/24/2013 1454   CL 104 07/19/2012 1432   CL 102 07/20/2011 1312   CO2 28 09/02/2014 1030   CO2 23 07/24/2013 1454   CO2 28 07/20/2011 1312   BUN 8.6 09/02/2014 1030   BUN 11 07/24/2013 1454   BUN 9 07/20/2011 1312   CREATININE 0.8 09/02/2014 1030   CREATININE 0.86 07/24/2013 1454   CREATININE 1.3* 07/20/2011 1312      Component Value Date/Time   CALCIUM 9.6 09/02/2014 1030   CALCIUM 9.4 07/24/2013 1454   CALCIUM 8.9 07/20/2011 1312   ALKPHOS 99 09/02/2014 1030   ALKPHOS 113 07/24/2013 1454   ALKPHOS 88* 07/20/2011 1312   AST 12 09/02/2014 1030   AST 14 07/24/2013 1454   AST 21 07/20/2011 1312   ALT 11 09/02/2014 1030   ALT 14 07/24/2013 1454   ALT 27 07/20/2011 1312   BILITOT <0.20 09/02/2014 1030   BILITOT 0.4 07/24/2013 1454   BILITOT 0.60 07/20/2011 1312       RADIOGRAPHIC STUDIES: Mr Evan [name] Wo Contrast  09-11-14   CLINICAL DATA:  64 year old male with. Lung cancer. Headaches. Staging. Subsequent encounter.  EXAM: MRI HEAD WITHOUT AND WITH CONTRAST  TECHNIQUE: Multiplanar, multiecho pulse sequences of the brain and surrounding structures were obtained without and with intravenous contrast.  CONTRAST:  96m MULTIHANCE GADOBENATE DIMEGLUMINE 529 MG/ML IV SOLN  COMPARISON:  Head CT without and with contrast 02/13/2008  FINDINGS: Mildly motion degraded post-contrast imaging. No abnormal enhancement identified. No midline shift, mass effect, or evidence of intracranial mass lesion. No dural thickening.  Normal cerebral volume. No restricted diffusion to suggest acute infarction. No ventriculomegaly, extra-axial collection or acute intracranial hemorrhage. Cervicomedullary junction and pituitary are within normal limits. Major intracranial vascular flow voids are preserved, dominant distal left vertebral artery. Tiny chronic lacunar infarct in the left cerebellum (series 6, image  9). Otherwise gray and white matter signal is within normal limits for age throughout the brain.  Visible internal auditory structures appear normal. Mastoids are clear. Mild right maxillary sinus mucous retention cyst. Orbits soft tissues appear normal. Visualized scalp soft tissues are within normal limits. Grossly negative visualized cervical spinal cord. Normal visualized bone marrow signal.  IMPRESSION: No acute or metastatic intracranial abnormality.   Electronically Signed   By: HGenevie AnnM.D.   On: 008-10-201613:38   Nm Pet Image Restag (ps) Skull Base To Thigh  708-11-2014  CLINICAL DATA:  Subsequent treatment strategy for lung carcinoma. Patient diagnosis stage IIIA lung carcinoma 2010. Patient status post radiotherapy and chemotherapy. Recent CT scan demonstrated concerning right upper lobe mass Send For  EXAM: NUCLEAR MEDICINE PET SKULL BASE TO THIGH  TECHNIQUE: 6.0 mCi F-18 FDG was injected intravenously. Full-ring PET imaging was performed from the skull base to thigh after the radiotracer. CT data was obtained and used for attenuation correction and anatomic localization.  FASTING BLOOD GLUCOSE:  Value: 92 mg/dl  COMPARISON:  CT 07/25/2014, 07/24/2013, PET-CT 02/09/2008  FINDINGS: NECK  No hypermetabolic lymph nodes in the neck.  CHEST  Hypermetabolic mass in the left upper lobe along the mediastinal border measures 3.9 x 3.9 cm with SUV max equal 27.8. There is a hypermetabolic suprahilar lymph node on image 69. There is a hypermetabolic prevascular lymph node with better seen on comparison CT of 07/25/2014.  No additional hypermetabolic activity in the thorax. No hypermetabolic activity associated with the perihilar linear consolidation bronchiectasis in the right lung.  ABDOMEN/PELVIS  No abnormal hypermetabolic activity within the liver, pancreas, adrenal glands, or spleen. No hypermetabolic lymph nodes in the abdomen or pelvis.  SKELETON  No focal hypermetabolic activity to suggest skeletal  metastasis.  IMPRESSION: 1. Intensely hypermetabolic left upper lobe mass consistent with bronchogenic carcinoma. Mass abuts the pleural surface. 2. Hypermetabolic left hilar and prevascular lymph node 3. Stable right hilar perihilar consolidation and bronchiectasis without evidence of metabolic activity consists with benign radiation change. 4. No evidence distant metastatic disease.   Electronically Signed   By: Suzy Bouchard M.D.   On: 08/16/2014 12:12   Ct Biopsy  08/30/2014   CLINICAL DATA:  Medial left upper lobe cavitary mass which is PET positive.  EXAM: CT GUIDED CORE BIOPSY OF MEDIAL LEFT UPPER LOBE MASS  ANESTHESIA/SEDATION: 1.0  Mg IV Versed; 50 mcg IV Fentanyl  Total Moderate Sedation Time: 10 minutes.  PROCEDURE: The procedure risks, benefits, and alternatives were explained to the patient. Questions regarding the procedure were encouraged and answered. The patient understands and consents to the procedure.  The anterior chest was prepped with ChloraPrepin a sterile fashion, and a sterile drape was applied covering the operative field. A sterile gown and sterile gloves were used for the procedure. Local anesthesia was provided with 1% Lidocaine.  Previous imaging reviewed. Patient positioned supine. Noncontrast localization CT performed. Medial left upper lobe lung mass was localized. Under sterile conditions and local anesthesia, a 17 gauge 6.8 cm access needle was advanced from a left parasternal approach to the medial margin of the left upper lobe mass along the mediastinal contour. Needle position confirmed with CT. 18 gauge core biopsies obtained and placed in formalin. Needle removed. No immediate complication. Patient tolerated the biopsy well.  Complications: None immediate  FINDINGS: CT imaging confirms needle placement in the medial left upper lobe cavitary mass for core biopsy  IMPRESSION: Successful CT-guided medial left upper lobe mass 18 gauge core biopsies   Electronically Signed    By: Jerilynn Mages.  Shick M.D.   On: 08/30/2014 13:09    ASSESSMENT AND PLAN: This is a very pleasant 64 years old [place] American male with history of stage IIIa non-small cell lung cancer status post concurrent chemoradiation followed by 3 cycles of consolidation chemotherapy and has been observation since May of 2010 with no evidence for disease progression. The patient has been observation for the last 6 years with no evidence for progression but the recent CT scan of the chest followed by PET scan showed hypermetabolic left upper lobe lung mass consistent with bronchogenic carcinoma in addition to hypermetabolic left hilar and prevascular lymph node. The previously treated right upper lobe and right hilar lymphadenopathy showed no hypermetabolic activity consistent with good response to the previous concurrent chemoradiation. Patient was discussed with and also seen by Dr. Julien Nordmann. He is status post left upper lobe core needle biopsy the left upper lobe mass. Pathology results are pending. We will plan to have him return in approximately 7 the 10 days to discuss the results of the pathology and  treatment options. For pain management he was given a prescription for Vicodin 5/325 mg tablets one tablet by mouth every 4-6 hours as needed for moderate to severe pain, total 30 with no refill.  The patient was advised to call immediately if he has any concerning symptoms in the interval.  All questions were answered. The patient knows to call the clinic with any problems, questions or concerns. We can certainly see the patient much sooner if necessary.  Carlton Adam, PA-C 09/02/2014   ADDENDUM: Hematology/Oncology Attending: I had a face to face encounter with the patient. I recommended his care plan. This is a very pleasant 64 years old African-American male with questionable new stage III left upper lobe non-small cell lung cancer awaiting tissue diagnosis. He was referred recently to Dr. Elsworth Soho for  consideration of bronchoscopy by Dr. Henderson Cloud felt that the patient may benefit from CT guided core biopsy of the left upper lobe lung mass which was performed last Friday and the final pathology is not available. I will arrange for the patient to come back for follow-up visit in 7-10 days for reevaluation and discussion of his treatment options based on the final pathology. For pain management was giving a refill of Vicodin. He was advised to call immediately if he has any concerning symptoms in the interval.  Disclaimer: This note was dictated with voice recognition software. Similar sounding words can inadvertently be transcribed and may be missed upon review. Eilleen Kempf., MD 09/02/2014

## 2014-09-03 ENCOUNTER — Telehealth: Payer: Self-pay | Admitting: Medical Oncology

## 2014-09-03 NOTE — Telephone Encounter (Signed)
Note done

## 2014-09-03 NOTE — Telephone Encounter (Signed)
Pt is asking for a note to resume driving after CT bx last week.

## 2014-09-10 ENCOUNTER — Telehealth: Payer: Self-pay | Admitting: *Deleted

## 2014-09-10 ENCOUNTER — Encounter: Payer: Self-pay | Admitting: Internal Medicine

## 2014-09-10 ENCOUNTER — Telehealth: Payer: Self-pay | Admitting: Internal Medicine

## 2014-09-10 ENCOUNTER — Ambulatory Visit (HOSPITAL_BASED_OUTPATIENT_CLINIC_OR_DEPARTMENT_OTHER): Payer: Medicare Other | Admitting: Internal Medicine

## 2014-09-10 ENCOUNTER — Other Ambulatory Visit (HOSPITAL_BASED_OUTPATIENT_CLINIC_OR_DEPARTMENT_OTHER): Payer: Medicare Other

## 2014-09-10 VITALS — BP 107/81 | HR 90 | Temp 98.5°F | Resp 18 | Ht 65.0 in | Wt 123.6 lb

## 2014-09-10 DIAGNOSIS — C3412 Malignant neoplasm of upper lobe, left bronchus or lung: Secondary | ICD-10-CM

## 2014-09-10 DIAGNOSIS — Z85118 Personal history of other malignant neoplasm of bronchus and lung: Secondary | ICD-10-CM

## 2014-09-10 DIAGNOSIS — C3492 Malignant neoplasm of unspecified part of left bronchus or lung: Secondary | ICD-10-CM

## 2014-09-10 DIAGNOSIS — M25512 Pain in left shoulder: Secondary | ICD-10-CM

## 2014-09-10 LAB — CBC WITH DIFFERENTIAL/PLATELET
BASO%: 1 % (ref 0.0–2.0)
BASOS ABS: 0.1 10*3/uL (ref 0.0–0.1)
EOS%: 2 % (ref 0.0–7.0)
Eosinophils Absolute: 0.2 10*3/uL (ref 0.0–0.5)
HCT: 35.7 % — ABNORMAL LOW (ref 38.4–49.9)
HGB: 11.6 g/dL — ABNORMAL LOW (ref 13.0–17.1)
LYMPH%: 15.7 % (ref 14.0–49.0)
MCH: 27.5 pg (ref 27.2–33.4)
MCHC: 32.6 g/dL (ref 32.0–36.0)
MCV: 84.3 fL (ref 79.3–98.0)
MONO#: 0.5 10*3/uL (ref 0.1–0.9)
MONO%: 6.4 % (ref 0.0–14.0)
NEUT#: 6.3 10*3/uL (ref 1.5–6.5)
NEUT%: 74.9 % (ref 39.0–75.0)
Platelets: 744 10*3/uL — ABNORMAL HIGH (ref 140–400)
RBC: 4.24 10*6/uL (ref 4.20–5.82)
RDW: 16.4 % — AB (ref 11.0–14.6)
WBC: 8.4 10*3/uL (ref 4.0–10.3)
lymph#: 1.3 10*3/uL (ref 0.9–3.3)

## 2014-09-10 LAB — COMPREHENSIVE METABOLIC PANEL (CC13)
ALT: 9 U/L (ref 0–55)
AST: 12 U/L (ref 5–34)
Albumin: 3.1 g/dL — ABNORMAL LOW (ref 3.5–5.0)
Alkaline Phosphatase: 98 U/L (ref 40–150)
Anion Gap: 7 mEq/L (ref 3–11)
BUN: 9.2 mg/dL (ref 7.0–26.0)
CALCIUM: 9.2 mg/dL (ref 8.4–10.4)
CO2: 26 meq/L (ref 22–29)
CREATININE: 0.7 mg/dL (ref 0.7–1.3)
Chloride: 104 mEq/L (ref 98–109)
EGFR: >90 mL/min/{1.73_m2} (ref >90)
Glucose: 77 mg/dl (ref 70–140)
POTASSIUM: 3.9 meq/L (ref 3.5–5.1)
SODIUM: 138 meq/L (ref 136–145)
Total Protein: 7.6 g/dL (ref 6.4–8.3)

## 2014-09-10 MED ORDER — PROCHLORPERAZINE MALEATE 10 MG PO TABS: 10.0000 mg | ORAL_TABLET | Freq: Four times a day (QID) | ORAL | Status: DC | PRN

## 2014-09-10 NOTE — Telephone Encounter (Signed)
Per staff message and POF I have scheduled appts. Advised scheduler of appts. JMW  

## 2014-09-10 NOTE — Telephone Encounter (Signed)
per pof to sch pt appt-sent MW emailt ot sch pt trmt-gave pt copy of avs-pt aware of appt-adv Radiation will call to sch appt directly with him

## 2014-09-10 NOTE — Progress Notes (Signed)
[place] [phone]   [phone]) [place], [place] [address]  PRINCIPAL DIAGNOSIS: 1) stage IIIA (T2a, N2, M0) non-small cell lung cancer, squamous cell carcinoma diagnosed in August 2016 2)  Stage IIIA (T2 N2 Mx) non-small cell lung cancer, adenocarcinoma, involving the right upper lobe and mediastinal lymphadenopathy diagnosed in June 2010.   PRIOR THERAPY:  1. Status post concurrent chemoradiation with weekly carboplatin and paclitaxel; last dose was given April 08, 2008. 2. Status post 3 cycles of consolidation chemotherapy with docetaxel; last dose was given on Jun 26, 2008.  CURRENT THERAPY: Concurrent chemoradiation with weekly carboplatin for AUC of 2 and paclitaxel 45 MG/M2. First dose expected on 09/23/2014.  INTERVAL HISTORY: Evan [name] 64 y.o. male returns to the clinic today for followup visit. The patient is feeling fine today with no specific complaints except for the fatigue and weight loss as well as mild pain in the left shoulder area. He is currently on treatment with Vicodin with some mild improvement but he gets more relief with ibuprofen. He denied having any cough, shortness of breath or hemoptysis. The patient denied having any significant weight loss or night sweats. He has no nausea or vomiting. His recent biopsy was consistent with invasive squamous cell carcinoma. The patient is here today for evaluation and discussion of his treatment options.  MEDICAL HISTORY: Past Medical History  Diagnosis Date  . Complication of anesthesia     " sometimes I wake up during surgery "  . Anxiety   . Depression   . Cancer     lung ca    ALLERGIES:  has No Known Allergies.  MEDICATIONS:  Current Outpatient Prescriptions  Medication Sig Dispense Refill  . aspirin-acetaminophen-caffeine (EXCEDRIN EXTRA STRENGTH) 250-250-65 MG per tablet Take 1-2 tablets by mouth 3  (three) times daily as needed for headache (shoulder pain).    [name] HYDROcodone-acetaminophen (NORCO/VICODIN) 5-325 MG per tablet Take 1 tablet by mouth every 6 (six) hours as needed for moderate pain. (Patient not taking: Reported on 09/10/2014) 30 tablet 0  . OVER THE COUNTER MEDICATION Take 480 mLs by mouth daily. Joint juice     No current facility-administered medications for this visit.    SURGICAL HISTORY:  Past Surgical History  Procedure Laterality Date  . Foot amputation Right     traumatic right lower extremity  . Amputation Right 05/21/2012    Procedure: AMPUTATION BELOW KNEE;  Surgeon: Newt Minion, MD;  Location: [facility];  Service: Orthopedics;  Laterality: Right;  . Upper jaw      SURGERY FOR INFECTION  . Colonoscopy w/ biopsies and polypectomy    . Amputation Right 06/21/2012    Procedure: Revision AMPUTATION BELOW KNEE Right;  Surgeon: Newt Minion, MD;  Location: [facility];  Service: Orthopedics;  Laterality: Right;  Revision right Below Knee Amputation    REVIEW OF SYSTEMS:  Constitutional: positive for fatigue Eyes: negative Ears, nose, mouth, throat, and face: negative Respiratory: positive for dyspnea on exertion and pleurisy/chest pain Cardiovascular: negative Gastrointestinal: negative Genitourinary:negative Integument/breast: negative Hematologic/lymphatic: negative Musculoskeletal:negative Neurological: negative Behavioral/Psych: negative Endocrine: negative Allergic/Immunologic: negative   PHYSICAL EXAMINATION: General appearance: alert, cooperative, fatigued and no distress Head: Normocephalic, without obvious abnormality, atraumatic Neck: no adenopathy Lymph nodes: Cervical, supraclavicular, and axillary nodes normal. Resp: clear to auscultation bilaterally Back: symmetric, no curvature. ROM normal. No CVA tenderness. Cardio: regular rate and rhythm, S1, S2  normal, no murmur, click, rub or gallop GI: soft, non-tender; bowel sounds normal; no masses,  no  organomegaly Extremities: Right below knee amputation Neurologic: Alert and oriented X 3, normal strength and tone. Normal symmetric reflexes. Normal coordination and gait  ECOG PERFORMANCE STATUS: 1 - Symptomatic but completely ambulatory  Blood pressure 107/81, pulse 90, temperature 98.5 F (36.9 C), temperature source Oral, resp. rate 18, height '5\' 5"'$  (1.651 m), weight 123 lb 9.6 oz (56.065 kg), SpO2 100 %.  LABORATORY DATA: Lab Results  Component Value Date   WBC 8.4 09/10/2014   HGB 11.6* 09/10/2014   HCT 35.7* 09/10/2014   MCV 84.3 09/10/2014   PLT 744* 09/10/2014      Chemistry      Component Value Date/Time   NA 138 09/10/2014 1124   NA 138 07/24/2013 1454   NA 139 07/20/2011 1312   K 3.9 09/10/2014 1124   K 4.4 07/24/2013 1454   K 4.8* 07/20/2011 1312   CL 101 07/24/2013 1454   CL 104 07/19/2012 1432   CL 102 07/20/2011 1312   CO2 26 09/10/2014 1124   CO2 23 07/24/2013 1454   CO2 28 07/20/2011 1312   BUN 9.2 09/10/2014 1124   BUN 11 07/24/2013 1454   BUN 9 07/20/2011 1312   CREATININE 0.7 09/10/2014 1124   CREATININE 0.86 07/24/2013 1454   CREATININE 1.3* 07/20/2011 1312      Component Value Date/Time   CALCIUM 9.2 09/10/2014 1124   CALCIUM 9.4 07/24/2013 1454   CALCIUM 8.9 07/20/2011 1312   ALKPHOS 98 09/10/2014 1124   ALKPHOS 113 07/24/2013 1454   ALKPHOS 88* 07/20/2011 1312   AST 12 09/10/2014 1124   AST 14 07/24/2013 1454   AST 21 07/20/2011 1312   ALT 9 09/10/2014 1124   ALT 14 07/24/2013 1454   ALT 27 07/20/2011 1312   BILITOT <0.20 09/10/2014 1124   BILITOT 0.4 07/24/2013 1454   BILITOT 0.60 07/20/2011 1312       RADIOGRAPHIC STUDIES: Mr [name] Wo Contrast  Sep 03, 2014   CLINICAL DATA:  64 year old male with. Lung cancer. Headaches. Staging. Subsequent encounter.  EXAM: MRI HEAD WITHOUT AND WITH CONTRAST  TECHNIQUE: Multiplanar, multiecho pulse sequences of the brain and surrounding structures were obtained without and with intravenous  contrast.  CONTRAST:  22m MULTIHANCE GADOBENATE DIMEGLUMINE 529 MG/ML IV SOLN  COMPARISON:  Head CT without and with contrast 02/13/2008  FINDINGS: Mildly motion degraded post-contrast imaging. No abnormal enhancement identified. No midline shift, mass effect, or evidence of intracranial mass lesion. No dural thickening.  Normal cerebral volume. No restricted diffusion to suggest acute infarction. No ventriculomegaly, extra-axial collection or acute intracranial hemorrhage. Cervicomedullary junction and pituitary are within normal limits. Major intracranial vascular flow voids are preserved, dominant distal left vertebral artery. Tiny chronic lacunar infarct in the left cerebellum (series 6, image 9). Otherwise gray and white matter signal is within normal limits for age throughout the brain.  Visible internal auditory structures appear normal. Mastoids are clear. Mild right maxillary sinus mucous retention cyst. Orbits soft tissues appear normal. Visualized scalp soft tissues are within normal limits. Grossly negative visualized cervical spinal cord. Normal visualized bone marrow signal.  IMPRESSION: No acute or metastatic intracranial abnormality.   Electronically Signed   By: HGenevie AnnM.D.   On: 008/03/1611:38   Nm Pet Image Restag (ps) Skull Base To Thigh  7August 02, 2016  CLINICAL DATA:  Subsequent treatment strategy for lung carcinoma. Patient diagnosis stage IIIA lung  carcinoma 2010. Patient status post radiotherapy and chemotherapy. Recent CT scan demonstrated concerning right upper lobe mass Send For  EXAM: NUCLEAR MEDICINE PET SKULL BASE TO THIGH  TECHNIQUE: 6.0 mCi F-18 FDG was injected intravenously. Full-ring PET imaging was performed from the skull base to thigh after the radiotracer. CT data was obtained and used for attenuation correction and anatomic localization.  FASTING BLOOD GLUCOSE:  Value: 92 mg/dl  COMPARISON:  CT 07/25/2014, 07/24/2013, PET-CT 02/09/2008  FINDINGS: NECK  No hypermetabolic  lymph nodes in the neck.  CHEST  Hypermetabolic mass in the left upper lobe along the mediastinal border measures 3.9 x 3.9 cm with SUV max equal 27.8. There is a hypermetabolic suprahilar lymph node on image 69. There is a hypermetabolic prevascular lymph node with better seen on comparison CT of 07/25/2014.  No additional hypermetabolic activity in the thorax. No hypermetabolic activity associated with the perihilar linear consolidation bronchiectasis in the right lung.  ABDOMEN/PELVIS  No abnormal hypermetabolic activity within the liver, pancreas, adrenal glands, or spleen. No hypermetabolic lymph nodes in the abdomen or pelvis.  SKELETON  No focal hypermetabolic activity to suggest skeletal metastasis.  IMPRESSION: 1. Intensely hypermetabolic left upper lobe mass consistent with bronchogenic carcinoma. Mass abuts the pleural surface. 2. Hypermetabolic left hilar and prevascular lymph node 3. Stable right hilar perihilar consolidation and bronchiectasis without evidence of metabolic activity consists with benign radiation change. 4. No evidence distant metastatic disease.   Electronically Signed   By: Suzy Bouchard M.D.   On: 08/16/2014 12:12   Ct Biopsy  08/30/2014   CLINICAL DATA:  Medial left upper lobe cavitary mass which is PET positive.  EXAM: CT GUIDED CORE BIOPSY OF MEDIAL LEFT UPPER LOBE MASS  ANESTHESIA/SEDATION: 1.0  Mg IV Versed; 50 mcg IV Fentanyl  Total Moderate Sedation Time: 10 minutes.  PROCEDURE: The procedure risks, benefits, and alternatives were explained to the patient. Questions regarding the procedure were encouraged and answered. The patient understands and consents to the procedure.  The anterior chest was prepped with ChloraPrepin a sterile fashion, and a sterile drape was applied covering the operative field. A sterile gown and sterile gloves were used for the procedure. Local anesthesia was provided with 1% Lidocaine.  Previous imaging reviewed. Patient positioned supine.  Noncontrast localization CT performed. Medial left upper lobe lung mass was localized. Under sterile conditions and local anesthesia, a 17 gauge 6.8 cm access needle was advanced from a left parasternal approach to the medial margin of the left upper lobe mass along the mediastinal contour. Needle position confirmed with CT. 18 gauge core biopsies obtained and placed in formalin. Needle removed. No immediate complication. Patient tolerated the biopsy well.  Complications: None immediate  FINDINGS: CT imaging confirms needle placement in the medial left upper lobe cavitary mass for core biopsy  IMPRESSION: Successful CT-guided medial left upper lobe mass 18 gauge core biopsies   Electronically Signed   By: Jerilynn Mages.  Shick M.D.   On: 08/30/2014 13:09    ASSESSMENT AND PLAN: This is a very pleasant 64 years old [place] American male with history of stage IIIa non-small cell lung cancer status post concurrent chemoradiation followed by 3 cycles of consolidation chemotherapy and has been observation since May of 2010 with no evidence for disease progression. The patient has been observation for the last 6 years with no evidence for progression but the recent CT scan of the chest followed by PET scan showed hypermetabolic left upper lobe lung mass consistent with bronchogenic carcinoma  in addition to hypermetabolic left hilar and prevascular lymph node. The final pathology was consistent with invasive squamous cell carcinoma. The previously treated right upper lobe and right hilar lymphadenopathy showed no hypermetabolic activity consistent with good response to the previous concurrent chemoradiation. I had a lengthy discussion with the patient today about his current disease status and treatment options. I recommended for the patient a course of concurrent chemoradiation with weekly carboplatin for AUC of 2 and paclitaxel 45 MG/M2. I discussed with the patient adverse effect of the chemotherapy including but not limited  to alopecia, myelosuppression, nausea and vomiting, peripheral neuropathy, liver or renal dysfunction. I will call his pharmacy with prescription for Compazine 10 mg by mouth every 6 hours as needed for nausea. I will refer the patient to Dr. [name] for evaluation and discussion of the radiotherapy option. He is expected to start the first cycle of his treatment on 09/23/2014. The patient would come back for follow-up visit in 3 weeks for reevaluation and management of any adverse effect of his treatment. He was advised to call immediately if she has any concerning symptoms in the interval.  All questions were answered. The patient knows to call the clinic with any problems, questions or concerns. We can certainly see the patient much sooner if necessary.  Disclaimer: This note was dictated with voice recognition software. Similar sounding words can inadvertently be transcribed and may not be corrected upon review.

## 2014-09-11 ENCOUNTER — Encounter: Payer: Self-pay | Admitting: Skilled Nursing Facility1

## 2014-09-11 ENCOUNTER — Telehealth: Payer: Self-pay | Admitting: Internal Medicine

## 2014-09-11 NOTE — Telephone Encounter (Signed)
per pof to sch pt appt-cld & spoke to pt after reply from MW to confirm appt &to adv of appt time & date-pt understood

## 2014-09-11 NOTE — Progress Notes (Signed)
Subjective:     Patient ID: Evan [name], male   DOB: November 29, 1950, 64 y.o.   MRN: [id number]  HPI   Review of Systems     Objective:   Physical Exam To assist the pt in identifying some dietary strategies to gain some lost wt back.    Assessment:     Pt identified as being malnourished due to losing some wt. Pt contacted via the telephone at [phone]. Pt states he has lost a couple pounds and describes his appetite as coming and going. Pt was hard to understand due to background noise and he was at work. Pt states he does avoid hamburger because it is bad for you.    Plan:     Dietitian advised he look into getting ensure/boost for days when he had trouble having meals. Dietitian also advised he stop avoiding foods he traditionally thought of as unhealthy and to also keep his pantry/freezer well stocked with prepared/easy to prepare meals.

## 2014-09-16 ENCOUNTER — Encounter: Payer: Self-pay | Admitting: Radiation Oncology

## 2014-09-16 ENCOUNTER — Ambulatory Visit
Admission: RE | Admit: 2014-09-16 | Discharge: 2014-09-16 | Disposition: A | Discharge status: Home / Self Care | Payer: Medicare Other | Source: Ambulatory Visit | Attending: Radiation Oncology | Admitting: Radiation Oncology

## 2014-09-16 VITALS — BP 124/88 | HR 108 | Resp 18 | Ht 65.0 in | Wt 121.7 lb

## 2014-09-16 DIAGNOSIS — Z510 Encounter for antineoplastic radiation therapy: Secondary | ICD-10-CM | POA: Insufficient documentation

## 2014-09-16 DIAGNOSIS — Z87891 Personal history of nicotine dependence: Secondary | ICD-10-CM | POA: Insufficient documentation

## 2014-09-16 DIAGNOSIS — C3412 Malignant neoplasm of upper lobe, left bronchus or lung: Secondary | ICD-10-CM | POA: Diagnosis not present

## 2014-09-16 DIAGNOSIS — Z85118 Personal history of other malignant neoplasm of bronchus and lung: Secondary | ICD-10-CM | POA: Diagnosis not present

## 2014-09-16 DIAGNOSIS — C3411 Malignant neoplasm of upper lobe, right bronchus or lung: Secondary | ICD-10-CM

## 2014-09-16 DIAGNOSIS — R05 Cough: Secondary | ICD-10-CM | POA: Diagnosis not present

## 2014-09-16 DIAGNOSIS — Z923 Personal history of irradiation: Secondary | ICD-10-CM | POA: Diagnosis not present

## 2014-09-16 DIAGNOSIS — Z9221 Personal history of antineoplastic chemotherapy: Secondary | ICD-10-CM | POA: Insufficient documentation

## 2014-09-16 DIAGNOSIS — R1310 Dysphagia, unspecified: Secondary | ICD-10-CM | POA: Insufficient documentation

## 2014-09-16 DIAGNOSIS — D381 Neoplasm of uncertain behavior of trachea, bronchus and lung: Secondary | ICD-10-CM

## 2014-09-16 NOTE — Progress Notes (Signed)
Reports intermittent headaches, dizziness, and nausea. Denies vomiting, diplopia or ringing in the ears. Reports intermittent left shoulder pain for which he take Advil. Received radiation therapy under the care of Dr. [name] to his right lung six years ago. Reports a productive cough with clear to green sputum with occasional strikes of hemoptysis. Reports SOB with exertion. Denies difficulty swallowing or chest pain. Reports an approximate six pound weight loss in less than a month. Reports he is working for [employer] presently.

## 2014-09-16 NOTE — Progress Notes (Signed)
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 Primary cancer of left upper lobe of lung 162.3 C34.12   2. Primary cancer of right upper lobe of lung 162.3 C34.11     HISTORY OF PRESENT ILLNESS::Evan [name] is a 64 y.o. male who is here in regards to recent diagnosis of squamous cell carcinoma in the left upper lung.   He previously had right upper lung T2N2 adenocarcinoma seen in this staging PET from 2010      He received concurrent chemo radiotherapy in 2010 with the radiation distribution shown below.            In routine follow-up he had an abnormal CT scan on 6/23 showing this left upper lung mass     PET scan on 7/15. This PET scan revealed a tumor in the left upper lung 3.9 cm in size with a high metabolism and SUV 27.8. Lymph node involvement was noted on the left side, also with high metabolism.          A CT guided biopsy was performed on 7/29 by Dr. Annamaria Boots which showed squamous cell carcinoma. Patient saw Dr. Julien Nordmann on 8/9 who recommended chemotherapy and radiotherapy, and the patient has been referred today to discuss this.  PREVIOUS RADIATION THERAPY: Yes, Right lung 6 years ago as above  PAST MEDICAL HISTORY:  has a past medical history of Complication of anesthesia; Anxiety; Depression; and Lung cancer.    PAST SURGICAL HISTORY: Past Surgical History  Procedure Laterality Date  . Foot amputation Right     traumatic right lower extremity  . Amputation Right 05/21/2012    Procedure: AMPUTATION BELOW KNEE;  Surgeon:  Newt Minion, MD;  Location: [facility];  Service: Orthopedics;  Laterality: Right;  . Upper jaw      SURGERY FOR INFECTION  . Colonoscopy w/ biopsies and polypectomy    . Amputation Right 06/21/2012    Procedure: Revision AMPUTATION BELOW KNEE Right;  Surgeon: Newt Minion, MD;  Location: [facility];  Service: Orthopedics;  Laterality: Right;  Revision right Below Knee Amputation    FAMILY HISTORY: family history includes Diabetes in his mother; Heart disease in his mother; Prostate cancer in his father.  SOCIAL HISTORY:  Social History   Social History  . Marital Status: Single    Spouse Name: N/A  . Number of Children: N/A  . Years of Education: N/A   Occupational History  . Not on file.   Social History Main Topics  . Smoking status: Former Smoker -- 0.50 packs/day for 45 years    Types: Cigarettes    Quit date: 06/21/2014  . Smokeless tobacco: Never Used  . Alcohol Use: No     Comment: rare  . Drug Use: No  . Sexual Activity: Not on file   Other Topics Concern  . Not on file   Social History Narrative    ALLERGIES: Review of patient's allergies indicates no known allergies.  MEDICATIONS:  Current Outpatient Prescriptions  Medication Sig Dispense Refill  . ibuprofen (ADVIL,MOTRIN) 200 MG tablet Take  200 mg by mouth every 6 (six) hours as needed.    . Ibuprofen-Diphenhydramine Cit (ADVIL PM PO) Take by mouth.    [name] OVER THE COUNTER MEDICATION Take 480 mLs by mouth daily. Joint juice    . aspirin-acetaminophen-caffeine (EXCEDRIN EXTRA STRENGTH) 250-250-65 MG per tablet Take 1-2 tablets by mouth 3 (three) times daily as needed for headache (shoulder pain).    [name] HYDROcodone-acetaminophen (NORCO/VICODIN) 5-325 MG per tablet Take 1 tablet by mouth every 6 (six) hours as needed for moderate pain. (Patient not taking: Reported on 09/10/2014) 30 tablet 0  . prochlorperazine (COMPAZINE) 10 MG tablet Take 1 tablet (10 mg total) by mouth every 6 (six) hours as needed for nausea or  vomiting. (Patient not taking: Reported on 09/16/2014) 30 tablet 0   No current facility-administered medications for this encounter.    REVIEW OF SYSTEMS:  A 15 point review of systems is documented in the electronic medical record. This was obtained by the nursing staff. However, I reviewed this with the patient to discuss relevant findings and make appropriate changes.  A comprehensive review of systems was negative.   Reports intermittent headaches, dizziness, and nausea. Denies vomiting, diplopia or ringing in the ears. Reports intermittent left shoulder pain for which he takes Advil. Received radiation therapy under the care of Dr. [name] to his right lung upper lung six years ago. He tolerated radiation therapy well previously. Reports a productive cough with clear to green sputum with occasional strikes of hemoptysis. Reports SOB with exertion. Denies difficulty swallowing or chest pain. Reports an approximate six pound weight loss in less than a month. Reports he is working for [employer] presently.   PHYSICAL EXAM:  height is '5\' 5"'$  (1.651 m) and weight is 121 lb 11.2 oz (55.203 kg). His blood pressure is 124/88 and his pulse is 108. His respiration is 18 and oxygen saturation is 100%.   per med-onc  General appearance: alert, cooperative, fatigued and no distress Head: Normocephalic, without obvious abnormality, atraumatic Neck: no adenopathy Lymph nodes: Cervical, supraclavicular, and axillary nodes normal. Resp: clear to auscultation bilaterally Back: symmetric, no curvature. ROM normal. No CVA tenderness. Cardio: regular rate and rhythm, S1, S2 normal, no murmur, click, rub or gallop GI: soft, non-tender; bowel sounds normal; no masses,  no organomegaly Extremities: Right below knee amputation Neurologic: Alert and oriented X 3, normal strength and tone. Normal symmetric reflexes. Normal coordination and gait   KPS = 100  100 - Normal; no complaints; no evidence of disease. 90   -  Able to carry on normal activity; minor signs or symptoms of disease. 80   - Normal activity with effort; some signs or symptoms of disease. 53   - Cares for self; unable to carry on normal activity or to do active work. 60   - Requires occasional assistance, but is able to care for most of his personal needs. 50   - Requires considerable assistance and frequent medical care. 35   - Disabled; requires special care and assistance. 96   - Severely disabled; hospital admission is indicated although death not imminent. 49   - Very sick; hospital admission necessary; active supportive treatment necessary. 10   - Moribund; fatal processes progressing rapidly. 0     - Dead  Karnofsky DA, Abelmann WH, Craver LS and Burchenal [facility] [phone]) The use of the nitrogen mustards in the palliative treatment of carcinoma: with particular reference to bronchogenic carcinoma Cancer 1 634-56  LABORATORY DATA:  Lab Results  Component Value Date   WBC 8.4 09/10/2014   HGB 11.6* 09/10/2014   HCT 35.7* 09/10/2014   MCV 84.3 09/10/2014   PLT 744* 09/10/2014   Lab Results  Component Value Date   NA 138 09/10/2014   K 3.9 09/10/2014   CL 101 07/24/2013   CO2 26 09/10/2014   Lab Results  Component Value Date   ALT 9 09/10/2014   AST 12 09/10/2014   ALKPHOS 98 09/10/2014   BILITOT <0.20 09/10/2014     RADIOGRAPHY: Ct Biopsy  08/30/2014   CLINICAL DATA:  Medial left upper lobe cavitary mass which is PET positive.  EXAM: CT GUIDED CORE BIOPSY OF MEDIAL LEFT UPPER LOBE MASS  ANESTHESIA/SEDATION: 1.0  Mg IV Versed; 50 mcg IV Fentanyl  Total Moderate Sedation Time: 10 minutes.  PROCEDURE: The procedure risks, benefits, and alternatives were explained to the patient. Questions regarding the procedure were encouraged and answered. The patient understands and consents to the procedure.  The anterior chest was prepped with ChloraPrepin a sterile fashion, and a sterile drape was applied covering the operative field. A  sterile gown and sterile gloves were used for the procedure. Local anesthesia was provided with 1% Lidocaine.  Previous imaging reviewed. Patient positioned supine. Noncontrast localization CT performed. Medial left upper lobe lung mass was localized. Under sterile conditions and local anesthesia, a 17 gauge 6.8 cm access needle was advanced from a left parasternal approach to the medial margin of the left upper lobe mass along the mediastinal contour. Needle position confirmed with CT. 18 gauge core biopsies obtained and placed in formalin. Needle removed. No immediate complication. Patient tolerated the biopsy well.  Complications: None immediate  FINDINGS: CT imaging confirms needle placement in the medial left upper lobe cavitary mass for core biopsy  IMPRESSION: Successful CT-guided medial left upper lobe mass 18 gauge core biopsies   Electronically Signed   By: Jerilynn Mages.  Shick M.D.   On: 08/30/2014 13:09   IMPRESSION: Evan [name] is a pleasant 64 yo gentleman with stage T2N2M0 sqamous cell carcinoma of the left upper lung with a history of previous right lung cancer. The patient would potentially benefit from concurrent radiotherapy.  The re-irradiation approach may require TomoTherapy IMRT irradiation therapy to the left lung.   PLAN: Today, I talked to the patient and family about the findings and work-up thus far.  We discussed the natural history of stage III non-small cell lung cancer and general treatment, highlighting the role of radiotherapy in the management.  We discussed the available radiation techniques, and focused on the details of logistics and delivery.  We reviewed the anticipated acute and late sequelae associated with radiation in this setting.  We reviewed the increased risk of re-irradiation.  The patient was encouraged to ask questions that I answered to the best of my ability.  The patient would like to proceed with radiation and will be scheduled for CT simulation.  I spent 60 minutes  minutes face to face with the patient and more than 50% of that time was spent in counseling and/or coordination of care.   The patient is scheduled to begin chemotherapy with Dr. Julien Nordmann next Monday, 8/22. We have scheduled for simulation and treatment planning on Wednesday, 8/17.   I spent 60 minutes minutes face to face with the patient and more than 50% of that time was spent in counseling and/or coordination of care.   This document serves as a record of services personally performed by Tyler Pita, MD. It  was created on his behalf by Arlyce Harman, a trained medical scribe. The creation of this record is based on the scribe's personal observations and the provider's statements to them. This document has been checked and approved by the attending provider.    ------------------------------------------------  [name] [name], M.D.

## 2014-09-16 NOTE — Progress Notes (Signed)
Thoracic Location of Tumor / Histology:  1) stage IIIA (T2a, N2, M0) non-small cell lung cancer, squamous cell carcinoma diagnosed in August 2016 2) Stage IIIA (T2 N2 Mx) non-small cell lung cancer, adenocarcinoma, involving the right upper lobe and mediastinal lymphadenopathy diagnosed in June 2010.   The patient has been observation for the last 6 years with no evidence for progression but the recent CT scan of the chest followed by PET scan showed hypermetabolic left upper lobe lung mass consistent with bronchogenic carcinoma in addition to hypermetabolic left hilar and prevascular lymph node. The final pathology was consistent with invasive squamous cell carcinoma.  Tobacco/Marijuana/Snuff/ETOH use: Former smoker quit May 2016  Past/Anticipated interventions by cardiothoracic surgery, if any: lung biopsy  Past/Anticipated interventions by medical oncology, if any:  PRIOR THERAPY:  1. Status post concurrent chemoradiation with weekly carboplatin and paclitaxel; last dose was given April 08, 2008. 2. Status post 3 cycles of consolidation chemotherapy with docetaxel; last dose was given on Jun 26, 2008.  CURRENT THERAPY: Concurrent chemoradiation with weekly carboplatin for AUC of 2 and paclitaxel 45 MG/M2. First dose expected on 09/23/2014.  Signs/Symptoms  Weight changes, if any: Yes  Respiratory complaints, if any: positive for dyspnea on exertion and pleurisy/chest pain  Hemoptysis, if any: no  Pain issues, if any:  Mild pain in the left shoulder area; prescribed vicodin for pain relief but, motrin is more effective  SAFETY ISSUES:  Prior radiation?   Pacemaker/ICD? no   Possible current pregnancy?no  Is the patient on methotrexate? no  Current Complaints / other details:  64 year old male. Patient has been under observation for the last six years with no evidence of progression until recent CT scan. Patient complains of fatigue and weight loss.

## 2014-09-16 NOTE — Progress Notes (Signed)
See progress note under physician encounter. 

## 2014-09-18 ENCOUNTER — Ambulatory Visit
Admission: RE | Admit: 2014-09-18 | Discharge: 2014-09-18 | Disposition: A | Discharge status: Home / Self Care | Payer: Medicare Other | Source: Ambulatory Visit | Attending: Radiation Oncology | Admitting: Radiation Oncology

## 2014-09-18 DIAGNOSIS — R05 Cough: Secondary | ICD-10-CM | POA: Diagnosis not present

## 2014-09-18 DIAGNOSIS — Z510 Encounter for antineoplastic radiation therapy: Secondary | ICD-10-CM | POA: Diagnosis not present

## 2014-09-18 DIAGNOSIS — C3412 Malignant neoplasm of upper lobe, left bronchus or lung: Secondary | ICD-10-CM | POA: Diagnosis not present

## 2014-09-18 DIAGNOSIS — Z87891 Personal history of nicotine dependence: Secondary | ICD-10-CM | POA: Diagnosis not present

## 2014-09-18 DIAGNOSIS — R1310 Dysphagia, unspecified: Secondary | ICD-10-CM | POA: Diagnosis not present

## 2014-09-18 DIAGNOSIS — Z85118 Personal history of other malignant neoplasm of bronchus and lung: Secondary | ICD-10-CM | POA: Diagnosis not present

## 2014-09-19 DIAGNOSIS — R1310 Dysphagia, unspecified: Secondary | ICD-10-CM | POA: Diagnosis not present

## 2014-09-19 DIAGNOSIS — Z87891 Personal history of nicotine dependence: Secondary | ICD-10-CM | POA: Diagnosis not present

## 2014-09-19 DIAGNOSIS — R05 Cough: Secondary | ICD-10-CM | POA: Diagnosis not present

## 2014-09-19 DIAGNOSIS — Z510 Encounter for antineoplastic radiation therapy: Secondary | ICD-10-CM | POA: Diagnosis not present

## 2014-09-19 DIAGNOSIS — C3412 Malignant neoplasm of upper lobe, left bronchus or lung: Secondary | ICD-10-CM | POA: Diagnosis not present

## 2014-09-19 DIAGNOSIS — Z85118 Personal history of other malignant neoplasm of bronchus and lung: Secondary | ICD-10-CM | POA: Diagnosis not present

## 2014-09-22 DIAGNOSIS — R1310 Dysphagia, unspecified: Secondary | ICD-10-CM | POA: Diagnosis not present

## 2014-09-22 DIAGNOSIS — Z85118 Personal history of other malignant neoplasm of bronchus and lung: Secondary | ICD-10-CM | POA: Diagnosis not present

## 2014-09-22 DIAGNOSIS — C3412 Malignant neoplasm of upper lobe, left bronchus or lung: Secondary | ICD-10-CM | POA: Diagnosis not present

## 2014-09-22 DIAGNOSIS — Z510 Encounter for antineoplastic radiation therapy: Secondary | ICD-10-CM | POA: Diagnosis not present

## 2014-09-22 DIAGNOSIS — R05 Cough: Secondary | ICD-10-CM | POA: Diagnosis not present

## 2014-09-22 DIAGNOSIS — Z87891 Personal history of nicotine dependence: Secondary | ICD-10-CM | POA: Diagnosis not present

## 2014-09-23 ENCOUNTER — Ambulatory Visit
Admission: RE | Admit: 2014-09-23 | Discharge: 2014-09-23 | Disposition: A | Discharge status: Home / Self Care | Payer: Medicare Other | Source: Ambulatory Visit | Attending: Radiation Oncology | Admitting: Radiation Oncology

## 2014-09-23 ENCOUNTER — Other Ambulatory Visit: Payer: Self-pay | Admitting: *Deleted

## 2014-09-23 ENCOUNTER — Ambulatory Visit (HOSPITAL_BASED_OUTPATIENT_CLINIC_OR_DEPARTMENT_OTHER): Payer: Medicare Other

## 2014-09-23 ENCOUNTER — Other Ambulatory Visit (HOSPITAL_BASED_OUTPATIENT_CLINIC_OR_DEPARTMENT_OTHER): Payer: Medicare Other

## 2014-09-23 ENCOUNTER — Other Ambulatory Visit: Payer: Medicare Other

## 2014-09-23 VITALS — BP 139/86 | HR 86 | Temp 97.9°F

## 2014-09-23 DIAGNOSIS — R1310 Dysphagia, unspecified: Secondary | ICD-10-CM | POA: Diagnosis not present

## 2014-09-23 DIAGNOSIS — Z510 Encounter for antineoplastic radiation therapy: Secondary | ICD-10-CM | POA: Diagnosis not present

## 2014-09-23 DIAGNOSIS — Z85118 Personal history of other malignant neoplasm of bronchus and lung: Secondary | ICD-10-CM | POA: Diagnosis not present

## 2014-09-23 DIAGNOSIS — R05 Cough: Secondary | ICD-10-CM | POA: Diagnosis not present

## 2014-09-23 DIAGNOSIS — C3412 Malignant neoplasm of upper lobe, left bronchus or lung: Secondary | ICD-10-CM | POA: Diagnosis present

## 2014-09-23 DIAGNOSIS — Z87891 Personal history of nicotine dependence: Secondary | ICD-10-CM | POA: Diagnosis not present

## 2014-09-23 DIAGNOSIS — Z5111 Encounter for antineoplastic chemotherapy: Secondary | ICD-10-CM | POA: Diagnosis present

## 2014-09-23 DIAGNOSIS — C3492 Malignant neoplasm of unspecified part of left bronchus or lung: Secondary | ICD-10-CM

## 2014-09-23 LAB — COMPREHENSIVE METABOLIC PANEL (CC13)
ALBUMIN: 3.1 g/dL — AB (ref 3.5–5.0)
ALK PHOS: 108 U/L (ref 40–150)
ALT: 15 U/L (ref 0–55)
AST: 12 U/L (ref 5–34)
Anion Gap: 11 mEq/L (ref 3–11)
BUN: 5.7 mg/dL — AB (ref 7.0–26.0)
CHLORIDE: 102 meq/L (ref 98–109)
CO2: 23 mEq/L (ref 22–29)
Calcium: 9.9 mg/dL (ref 8.4–10.4)
Creatinine: 0.7 mg/dL (ref 0.7–1.3)
Glucose: 110 mg/dl (ref 70–140)
POTASSIUM: 4 meq/L (ref 3.5–5.1)
Sodium: 136 mEq/L (ref 136–145)
Total Bilirubin: <0.2 mg/dL (ref 0.20–1.20)
Total Protein: 7.8 g/dL (ref 6.4–8.3)

## 2014-09-23 LAB — CBC WITH DIFFERENTIAL/PLATELET
BASO%: 0.6 % (ref 0.0–2.0)
BASOS ABS: 0.1 10*3/uL (ref 0.0–0.1)
EOS ABS: 0.2 10*3/uL (ref 0.0–0.5)
EOS%: 1.9 % (ref 0.0–7.0)
HCT: 35.1 % — ABNORMAL LOW (ref 38.4–49.9)
HGB: 11.6 g/dL — ABNORMAL LOW (ref 13.0–17.1)
LYMPH%: 14.4 % (ref 14.0–49.0)
MCH: 28 pg (ref 27.2–33.4)
MCHC: 33.1 g/dL (ref 32.0–36.0)
MCV: 84.6 fL (ref 79.3–98.0)
MONO#: 0.6 10*3/uL (ref 0.1–0.9)
MONO%: 5.2 % (ref 0.0–14.0)
NEUT#: 8.8 10*3/uL — ABNORMAL HIGH (ref 1.5–6.5)
NEUT%: 77.9 % — ABNORMAL HIGH (ref 39.0–75.0)
Platelets: 682 10*3/uL — ABNORMAL HIGH (ref 140–400)
RBC: 4.15 10*6/uL — ABNORMAL LOW (ref 4.20–5.82)
RDW: 17 % — ABNORMAL HIGH (ref 11.0–14.6)
WBC: 11.3 10*3/uL — ABNORMAL HIGH (ref 4.0–10.3)
lymph#: 1.6 10*3/uL (ref 0.9–3.3)

## 2014-09-23 MED ORDER — FAMOTIDINE IN NACL 20-0.9 MG/50ML-% IV SOLN
20.0000 mg | Freq: Once | INTRAVENOUS | Status: AC
Start: 2014-09-23 — End: 2014-09-23
  Administered 2014-09-23: 20 mg via INTRAVENOUS

## 2014-09-23 MED ORDER — PACLITAXEL CHEMO INJECTION 300 MG/50ML
45.0000 mg/m2 | Freq: Once | INTRAVENOUS | Status: AC
  Administered 2014-09-23: 72 mg via INTRAVENOUS
  Filled 2014-09-23: qty 12

## 2014-09-23 MED ORDER — SODIUM CHLORIDE 0.9 % IV SOLN
200.0000 mg | Freq: Once | INTRAVENOUS | Status: AC
  Administered 2014-09-23: 200 mg via INTRAVENOUS
  Filled 2014-09-23: qty 20

## 2014-09-23 MED ORDER — SODIUM CHLORIDE 0.9 % IV SOLN
Freq: Once | INTRAVENOUS | Status: AC
  Administered 2014-09-23: 13:00:00 via INTRAVENOUS
  Filled 2014-09-23: qty 8

## 2014-09-23 MED ORDER — DIPHENHYDRAMINE HCL 50 MG/ML IJ SOLN
50.0000 mg | Freq: Once | INTRAMUSCULAR | Status: AC
  Administered 2014-09-23: 50 mg via INTRAVENOUS

## 2014-09-23 MED ORDER — DIPHENHYDRAMINE HCL 50 MG/ML IJ SOLN
INTRAMUSCULAR | Status: AC
  Filled 2014-09-23: qty 1

## 2014-09-23 MED ORDER — FAMOTIDINE IN NACL 20-0.9 MG/50ML-% IV SOLN
INTRAVENOUS | Status: AC
  Filled 2014-09-23: qty 50

## 2014-09-23 MED ORDER — SODIUM CHLORIDE 0.9 % IV SOLN
Freq: Once | INTRAVENOUS | Status: AC
  Administered 2014-09-23: 12:00:00 via INTRAVENOUS

## 2014-09-23 NOTE — Patient Instructions (Signed)
[facility] Discharge Instructions for Patients Receiving Chemotherapy  Today you received the following chemotherapy agents Taxol/Carboplatin To help prevent nausea and vomiting after your treatment, we encourage you to take your nausea medication as prescribed.   If you develop nausea and vomiting that is not controlled by your nausea medication, call the clinic.   BELOW ARE SYMPTOMS THAT SHOULD BE REPORTED IMMEDIATELY:  *FEVER GREATER THAN 100.5 F  *CHILLS WITH OR WITHOUT FEVER  NAUSEA AND VOMITING THAT IS NOT CONTROLLED WITH YOUR NAUSEA MEDICATION  *UNUSUAL SHORTNESS OF BREATH  *UNUSUAL BRUISING OR BLEEDING  TENDERNESS IN MOUTH AND THROAT WITH OR WITHOUT PRESENCE OF ULCERS  *URINARY PROBLEMS  *BOWEL PROBLEMS  UNUSUAL RASH Items with * indicate a potential emergency and should be followed up as soon as possible.  Feel free to call the clinic you have any questions or concerns. The clinic phone number is [phone].  Please show the CHEMO ALERT CARD at check-in to the Emergency Department and triage nurse.   

## 2014-09-24 ENCOUNTER — Ambulatory Visit
Admission: RE | Admit: 2014-09-24 | Discharge: 2014-09-24 | Disposition: A | Discharge status: Home / Self Care | Payer: Medicare Other | Source: Ambulatory Visit | Attending: Radiation Oncology | Admitting: Radiation Oncology

## 2014-09-24 DIAGNOSIS — Z85118 Personal history of other malignant neoplasm of bronchus and lung: Secondary | ICD-10-CM | POA: Diagnosis not present

## 2014-09-24 DIAGNOSIS — R1310 Dysphagia, unspecified: Secondary | ICD-10-CM | POA: Diagnosis not present

## 2014-09-24 DIAGNOSIS — C3412 Malignant neoplasm of upper lobe, left bronchus or lung: Secondary | ICD-10-CM | POA: Diagnosis not present

## 2014-09-24 DIAGNOSIS — Z510 Encounter for antineoplastic radiation therapy: Secondary | ICD-10-CM | POA: Diagnosis not present

## 2014-09-24 DIAGNOSIS — R05 Cough: Secondary | ICD-10-CM | POA: Diagnosis not present

## 2014-09-24 DIAGNOSIS — Z87891 Personal history of nicotine dependence: Secondary | ICD-10-CM | POA: Diagnosis not present

## 2014-09-25 ENCOUNTER — Ambulatory Visit
Admission: RE | Admit: 2014-09-25 | Discharge: 2014-09-25 | Disposition: A | Discharge status: Home / Self Care | Payer: Medicare Other | Source: Ambulatory Visit | Attending: Radiation Oncology | Admitting: Radiation Oncology

## 2014-09-25 DIAGNOSIS — R1310 Dysphagia, unspecified: Secondary | ICD-10-CM | POA: Diagnosis not present

## 2014-09-25 DIAGNOSIS — C3412 Malignant neoplasm of upper lobe, left bronchus or lung: Secondary | ICD-10-CM | POA: Diagnosis not present

## 2014-09-25 DIAGNOSIS — Z87891 Personal history of nicotine dependence: Secondary | ICD-10-CM | POA: Diagnosis not present

## 2014-09-25 DIAGNOSIS — Z510 Encounter for antineoplastic radiation therapy: Secondary | ICD-10-CM | POA: Diagnosis not present

## 2014-09-25 DIAGNOSIS — Z85118 Personal history of other malignant neoplasm of bronchus and lung: Secondary | ICD-10-CM | POA: Diagnosis not present

## 2014-09-25 DIAGNOSIS — R05 Cough: Secondary | ICD-10-CM | POA: Diagnosis not present

## 2014-09-26 ENCOUNTER — Ambulatory Visit: Payer: Medicare Other | Admitting: Radiation Oncology

## 2014-09-26 ENCOUNTER — Ambulatory Visit
Admission: RE | Admit: 2014-09-26 | Discharge: 2014-09-26 | Disposition: A | Discharge status: Home / Self Care | Payer: Medicare Other | Source: Ambulatory Visit | Attending: Radiation Oncology | Admitting: Radiation Oncology

## 2014-09-26 ENCOUNTER — Telehealth: Payer: Self-pay | Admitting: *Deleted

## 2014-09-26 DIAGNOSIS — Z510 Encounter for antineoplastic radiation therapy: Secondary | ICD-10-CM | POA: Diagnosis not present

## 2014-09-26 DIAGNOSIS — R1310 Dysphagia, unspecified: Secondary | ICD-10-CM | POA: Diagnosis not present

## 2014-09-26 DIAGNOSIS — Z85118 Personal history of other malignant neoplasm of bronchus and lung: Secondary | ICD-10-CM | POA: Diagnosis not present

## 2014-09-26 DIAGNOSIS — C3412 Malignant neoplasm of upper lobe, left bronchus or lung: Secondary | ICD-10-CM | POA: Diagnosis not present

## 2014-09-26 DIAGNOSIS — Z87891 Personal history of nicotine dependence: Secondary | ICD-10-CM | POA: Diagnosis not present

## 2014-09-26 DIAGNOSIS — R05 Cough: Secondary | ICD-10-CM | POA: Diagnosis not present

## 2014-09-26 NOTE — Telephone Encounter (Signed)
Attempt to reach unsuccessful. lmovm to call office with any concerns.

## 2014-09-26 NOTE — Telephone Encounter (Signed)
-----   Message from Renford Dills, RN sent at 09/23/2014  1:23 PM EDT ----- Regarding: chemo f/u [facility]. Had in 2013

## 2014-09-27 ENCOUNTER — Ambulatory Visit
Admission: RE | Admit: 2014-09-27 | Discharge: 2014-09-27 | Disposition: A | Discharge status: Home / Self Care | Payer: Medicare Other | Source: Ambulatory Visit | Attending: Radiation Oncology | Admitting: Radiation Oncology

## 2014-09-27 ENCOUNTER — Other Ambulatory Visit: Payer: Self-pay | Admitting: *Deleted

## 2014-09-27 ENCOUNTER — Encounter: Payer: Self-pay | Admitting: Radiation Oncology

## 2014-09-27 VITALS — BP 136/79 | HR 97 | Resp 18 | Wt 122.0 lb

## 2014-09-27 DIAGNOSIS — R1310 Dysphagia, unspecified: Secondary | ICD-10-CM | POA: Diagnosis not present

## 2014-09-27 DIAGNOSIS — C3412 Malignant neoplasm of upper lobe, left bronchus or lung: Secondary | ICD-10-CM | POA: Insufficient documentation

## 2014-09-27 DIAGNOSIS — C3411 Malignant neoplasm of upper lobe, right bronchus or lung: Secondary | ICD-10-CM

## 2014-09-27 DIAGNOSIS — Z85118 Personal history of other malignant neoplasm of bronchus and lung: Secondary | ICD-10-CM | POA: Diagnosis not present

## 2014-09-27 DIAGNOSIS — Z87891 Personal history of nicotine dependence: Secondary | ICD-10-CM | POA: Insufficient documentation

## 2014-09-27 DIAGNOSIS — R05 Cough: Secondary | ICD-10-CM | POA: Diagnosis not present

## 2014-09-27 DIAGNOSIS — Z510 Encounter for antineoplastic radiation therapy: Secondary | ICD-10-CM | POA: Diagnosis not present

## 2014-09-27 MED ORDER — RADIAPLEXRX EX GEL
Freq: Once | CUTANEOUS | Status: AC
Start: 2014-09-27 — End: 2014-09-27
  Administered 2014-09-27: 14:00:00 via TOPICAL

## 2014-09-27 NOTE — Progress Notes (Signed)
  Radiation Oncology         [phone]   Name: Evan [name] MRN: [id number]   Date: 09/27/2014  DOB: November 18, 1950      Weekly Radiation Therapy Management    ICD-9-CM ICD-10-CM   1. 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, stage III - 2016 162.3 C34.12     Current Dose: 10 Gy  Planned Dose:  66 Gy  Narrative The patient presents for routine under treatment assessment. Weight and vitals stable. Report a severe headache for the last several day. Reports his headache was so bad last night it woke him up.  He takes Advil to alleviate headache. Reports dizziness with headache. Denies nausea, vomiting, diplopia or ringing in the ears. Reports productive cough with clear to green sputum. Denies hemoptysis. Denies difficult or painful swallowing. Denies SOB. No skin changes noted within treatment field..  The patient is without complaint. Set-up films were reviewed. The chart was checked.  Physical Findings  weight is 122 lb (55.339 kg). His blood pressure is 136/79 and his pulse is 97. His respiration is 18 and oxygen saturation is 100%. . Weight essentially stable.  No significant changes.  Impression The patient is tolerating radiation.  Plan Continue treatment as planned.    This document serves as a record of services personally performed by Tyler Pita, MD. It was created on his behalf by Janace Hoard, a trained medical scribe. The creation of this record is based on the scribe's personal observations and the provider's statements to them. This document has been checked and approved by the attending provider.      [name] [name], M.D.

## 2014-09-27 NOTE — Progress Notes (Addendum)
Weight and vitals stable. Report a severe headache for the last several day. Reports his headache was so bad last night it woke him up. Reports dizziness with headache. Denies nausea, vomiting, diplopia or ringing in the ears. Reports productive cough with clear to green sputum. Denies hemoptysis. Denies difficult or painful swallowing. Denies SOB. No skin changes noted within treatment field. Oriented patient to staff and routine of the clinic. Provided patient with RADIATION THERAPY AND YOU handbook then, reviewed pertinent information. Educated patient reference potential side effects and management such as, fatigue, skin changes, and throat changes. Provided patient with radiaplex gel and directed upon use. Answered all questions to the best of my ability. Patient verbalized understanding of all reviewed.   BP 136/79 mmHg  Pulse 97  Resp 18  Wt 122 lb (55.339 kg)  SpO2 100% Wt Readings from Last 3 Encounters:  09/27/14 122 lb (55.339 kg)  09/16/14 121 lb 11.2 oz (55.203 kg)  09/10/14 123 lb 9.6 oz (56.065 kg)

## 2014-09-30 ENCOUNTER — Ambulatory Visit (HOSPITAL_BASED_OUTPATIENT_CLINIC_OR_DEPARTMENT_OTHER): Payer: Medicare Other | Admitting: Physician Assistant

## 2014-09-30 ENCOUNTER — Other Ambulatory Visit (HOSPITAL_BASED_OUTPATIENT_CLINIC_OR_DEPARTMENT_OTHER): Payer: Medicare Other

## 2014-09-30 ENCOUNTER — Ambulatory Visit (HOSPITAL_BASED_OUTPATIENT_CLINIC_OR_DEPARTMENT_OTHER): Payer: Medicare Other

## 2014-09-30 ENCOUNTER — Encounter: Payer: Self-pay | Admitting: Physician Assistant

## 2014-09-30 ENCOUNTER — Telehealth: Payer: Self-pay | Admitting: Internal Medicine

## 2014-09-30 ENCOUNTER — Other Ambulatory Visit: Payer: Self-pay | Admitting: Neurology

## 2014-09-30 ENCOUNTER — Ambulatory Visit
Admission: RE | Admit: 2014-09-30 | Discharge: 2014-09-30 | Disposition: A | Discharge status: Home / Self Care | Payer: Medicare Other | Source: Ambulatory Visit | Attending: Radiation Oncology | Admitting: Radiation Oncology

## 2014-09-30 ENCOUNTER — Ambulatory Visit: Payer: Medicare Other | Admitting: Radiation Oncology

## 2014-09-30 VITALS — BP 140/79 | HR 97 | Temp 97.9°F | Resp 18 | Ht 65.0 in | Wt 121.7 lb

## 2014-09-30 DIAGNOSIS — C3492 Malignant neoplasm of unspecified part of left bronchus or lung: Secondary | ICD-10-CM

## 2014-09-30 DIAGNOSIS — Z5111 Encounter for antineoplastic chemotherapy: Secondary | ICD-10-CM | POA: Diagnosis present

## 2014-09-30 DIAGNOSIS — C3412 Malignant neoplasm of upper lobe, left bronchus or lung: Secondary | ICD-10-CM

## 2014-09-30 DIAGNOSIS — R1310 Dysphagia, unspecified: Secondary | ICD-10-CM | POA: Diagnosis not present

## 2014-09-30 DIAGNOSIS — Z85118 Personal history of other malignant neoplasm of bronchus and lung: Secondary | ICD-10-CM | POA: Diagnosis not present

## 2014-09-30 DIAGNOSIS — R05 Cough: Secondary | ICD-10-CM | POA: Diagnosis not present

## 2014-09-30 DIAGNOSIS — Z510 Encounter for antineoplastic radiation therapy: Secondary | ICD-10-CM | POA: Diagnosis not present

## 2014-09-30 DIAGNOSIS — C3411 Malignant neoplasm of upper lobe, right bronchus or lung: Secondary | ICD-10-CM

## 2014-09-30 DIAGNOSIS — Z87891 Personal history of nicotine dependence: Secondary | ICD-10-CM | POA: Diagnosis not present

## 2014-09-30 LAB — CBC WITH DIFFERENTIAL/PLATELET
BASO%: 0.7 % (ref 0.0–2.0)
BASOS ABS: 0.1 10*3/uL (ref 0.0–0.1)
EOS%: 2.8 % (ref 0.0–7.0)
Eosinophils Absolute: 0.2 10*3/uL (ref 0.0–0.5)
HEMATOCRIT: 36 % — AB (ref 38.4–49.9)
HGB: 11.8 g/dL — ABNORMAL LOW (ref 13.0–17.1)
LYMPH#: 0.9 10*3/uL (ref 0.9–3.3)
LYMPH%: 10.9 % — ABNORMAL LOW (ref 14.0–49.0)
MCH: 27.8 pg (ref 27.2–33.4)
MCHC: 32.7 g/dL (ref 32.0–36.0)
MCV: 85.1 fL (ref 79.3–98.0)
MONO#: 0.5 10*3/uL (ref 0.1–0.9)
MONO%: 6.4 % (ref 0.0–14.0)
NEUT#: 6.4 10*3/uL (ref 1.5–6.5)
NEUT%: 79.2 % — AB (ref 39.0–75.0)
PLATELETS: 693 10*3/uL — AB (ref 140–400)
RBC: 4.24 10*6/uL (ref 4.20–5.82)
RDW: 17 % — ABNORMAL HIGH (ref 11.0–14.6)
WBC: 8.1 10*3/uL (ref 4.0–10.3)

## 2014-09-30 LAB — COMPREHENSIVE METABOLIC PANEL (CC13)
ALT: 11 U/L (ref 0–55)
ANION GAP: 9 meq/L (ref 3–11)
AST: 11 U/L (ref 5–34)
Albumin: 3.1 g/dL — ABNORMAL LOW (ref 3.5–5.0)
Alkaline Phosphatase: 99 U/L (ref 40–150)
BUN: 9.5 mg/dL (ref 7.0–26.0)
CALCIUM: 9.7 mg/dL (ref 8.4–10.4)
CHLORIDE: 103 meq/L (ref 98–109)
CO2: 25 mEq/L (ref 22–29)
Creatinine: 0.7 mg/dL (ref 0.7–1.3)
Glucose: 97 mg/dl (ref 70–140)
POTASSIUM: 4.1 meq/L (ref 3.5–5.1)
Sodium: 137 mEq/L (ref 136–145)
Total Bilirubin: 0.3 mg/dL (ref 0.20–1.20)
Total Protein: 7.9 g/dL (ref 6.4–8.3)

## 2014-09-30 MED ORDER — DIPHENHYDRAMINE HCL 50 MG/ML IJ SOLN
INTRAMUSCULAR | Status: AC
Start: 2014-09-30 — End: 2014-09-30
  Filled 2014-09-30: qty 1

## 2014-09-30 MED ORDER — FAMOTIDINE IN NACL 20-0.9 MG/50ML-% IV SOLN
20.0000 mg | Freq: Once | INTRAVENOUS | Status: AC
  Administered 2014-09-30: 20 mg via INTRAVENOUS

## 2014-09-30 MED ORDER — SODIUM CHLORIDE 0.9 % IV SOLN
Freq: Once | INTRAVENOUS | Status: AC
  Administered 2014-09-30: 14:00:00 via INTRAVENOUS
  Filled 2014-09-30: qty 8

## 2014-09-30 MED ORDER — FAMOTIDINE IN NACL 20-0.9 MG/50ML-% IV SOLN
INTRAVENOUS | Status: AC
  Filled 2014-09-30: qty 50

## 2014-09-30 MED ORDER — SODIUM CHLORIDE 0.9 % IV SOLN
200.0000 mg | Freq: Once | INTRAVENOUS | Status: AC
  Administered 2014-09-30: 200 mg via INTRAVENOUS
  Filled 2014-09-30: qty 20

## 2014-09-30 MED ORDER — PACLITAXEL CHEMO INJECTION 300 MG/50ML
45.0000 mg/m2 | Freq: Once | INTRAVENOUS | Status: AC
  Administered 2014-09-30: 72 mg via INTRAVENOUS
  Filled 2014-09-30: qty 12

## 2014-09-30 MED ORDER — DIPHENHYDRAMINE HCL 50 MG/ML IJ SOLN
50.0000 mg | Freq: Once | INTRAMUSCULAR | Status: AC
  Administered 2014-09-30: 50 mg via INTRAVENOUS

## 2014-09-30 MED ORDER — SODIUM CHLORIDE 0.9 % IV SOLN
Freq: Once | INTRAVENOUS | Status: AC
Start: 2014-09-30 — End: 2014-09-30
  Administered 2014-09-30: 13:00:00 via INTRAVENOUS

## 2014-09-30 NOTE — Telephone Encounter (Signed)
Gave and printed appt sched and avs for pt for Aug thru OCT

## 2014-09-30 NOTE — Patient Instructions (Signed)
Evan [name] Discharge Instructions for Patients Receiving Chemotherapy  Today you received the following chemotherapy agents taxol, carboplatin  To help prevent nausea and vomiting after your treatment, we encourage you to take your nausea medication   If you develop nausea and vomiting that is not controlled by your nausea medication, call the clinic.   BELOW ARE SYMPTOMS THAT SHOULD BE REPORTED IMMEDIATELY:  *FEVER GREATER THAN 100.5 F  *CHILLS WITH OR WITHOUT FEVER  NAUSEA AND VOMITING THAT IS NOT CONTROLLED WITH YOUR NAUSEA MEDICATION  *UNUSUAL SHORTNESS OF BREATH  *UNUSUAL BRUISING OR BLEEDING  TENDERNESS IN MOUTH AND THROAT WITH OR WITHOUT PRESENCE OF ULCERS  *URINARY PROBLEMS  *BOWEL PROBLEMS  UNUSUAL RASH Items with * indicate a potential emergency and should be followed up as soon as possible.  Feel free to call the clinic you have any questions or concerns. The clinic phone number is [phone].  Please show the [place] at check-in to the Emergency Department and triage nurse.

## 2014-09-30 NOTE — Progress Notes (Signed)
[facility] [phone]   Fax:([phone]) [place], [place] [address]  PRINCIPAL DIAGNOSIS: 1) stage IIIA (T2a, N2, M0) non-small cell lung cancer, squamous cell carcinoma diagnosed in August 2016 2)  Stage IIIA (T2 N2 Mx) non-small cell lung cancer, adenocarcinoma, involving the right upper lobe and mediastinal lymphadenopathy diagnosed in June 2010.   PRIOR THERAPY:  1. Status post concurrent chemoradiation with weekly carboplatin and paclitaxel; last dose was given April 08, 2008. 2. Status post 3 cycles of consolidation chemotherapy with docetaxel; last dose was given on Jun 26, 2008.  CURRENT THERAPY: Concurrent chemoradiation with weekly carboplatin for AUC of 2 and paclitaxel 45 MG/M2. First dose expected on 09/23/2014.  INTERVAL HISTORY: Evan [name] 64 y.o. male returns to the clinic today for followup visit. He is currently receiving a course of concurrent chemoradiation and thus far is tolerating his treatment without significant difficulty. The patient is feeling fine today with no specific complaints except for the fatigue and weight loss as well as mild pain in the left shoulder area. He is currently on treatment with Vicodin with some mild improvement but he continues to get more relief with ibuprofen. He denied having any cough, shortness of breath or hemoptysis. The patient denied having any significant weight loss or night sweats. He has no nausea or vomiting.   MEDICAL HISTORY: Past Medical History  Diagnosis Date  . Complication of anesthesia     " sometimes I wake up during surgery "  . Anxiety   . Depression   . Lung cancer     non small cell lung cancer    ALLERGIES:  has No Known Allergies.  MEDICATIONS:  Current Outpatient Prescriptions  Medication Sig Dispense Refill  . aspirin-acetaminophen-caffeine (EXCEDRIN EXTRA STRENGTH) 250-250-65 MG per tablet Take 1-2  tablets by mouth 3 (three) times daily as needed for headache (shoulder pain).    [name] HYDROcodone-acetaminophen (NORCO/VICODIN) 5-325 MG per tablet Take 1 tablet by mouth every 6 (six) hours as needed for moderate pain. (Patient not taking: Reported on 09/10/2014) 30 tablet 0  . ibuprofen (ADVIL,MOTRIN) 200 MG tablet Take 200 mg by mouth every 6 (six) hours as needed.    . Ibuprofen-Diphenhydramine Cit (ADVIL PM PO) Take by mouth.    [name] OVER THE COUNTER MEDICATION Take 480 mLs by mouth daily. Joint juice    . prochlorperazine (COMPAZINE) 10 MG tablet Take 1 tablet (10 mg total) by mouth every 6 (six) hours as needed for nausea or vomiting. (Patient not taking: Reported on 09/16/2014) 30 tablet 0   No current facility-administered medications for this visit.   Facility-Administered Medications Ordered in Other Visits  Medication Dose Route Frequency Provider Last Rate Last Dose  . 0.9 %  sodium chloride infusion   Intravenous Once Curt Bears, MD      . CARBOplatin (PARAPLATIN) 200 mg in sodium chloride 0.9 % 100 mL chemo infusion  200 mg Intravenous Once Curt Bears, MD      . diphenhydrAMINE (BENADRYL) injection 50 mg  50 mg Intravenous Once Curt Bears, MD      . famotidine (PEPCID) IVPB 20 mg premix  20 mg Intravenous Once Curt Bears, MD      . ondansetron (ZOFRAN) 16 mg, dexamethasone (DECADRON) 20 mg in sodium chloride 0.9 % 50 mL IVPB   Intravenous Once Curt Bears, MD      . PACLitaxel (TAXOL) 72 mg in  dextrose 5 % 250 mL chemo infusion (</= '80mg'$ /m2)  45 mg/m2 (Treatment Plan Actual) Intravenous Once Curt Bears, MD        SURGICAL HISTORY:  Past Surgical History  Procedure Laterality Date  . Foot amputation Right     traumatic right lower extremity  . Amputation Right 05/21/2012    Procedure: AMPUTATION BELOW KNEE;  Surgeon: Newt Minion, MD;  Location: [facility];  Service: Orthopedics;  Laterality: Right;  . Upper jaw      SURGERY FOR INFECTION  . Colonoscopy w/  biopsies and polypectomy    . Amputation Right 06/21/2012    Procedure: Revision AMPUTATION BELOW KNEE Right;  Surgeon: Newt Minion, MD;  Location: [facility];  Service: Orthopedics;  Laterality: Right;  Revision right Below Knee Amputation    REVIEW OF SYSTEMS:  Constitutional: positive for fatigue Eyes: negative Ears, nose, mouth, throat, and face: negative Respiratory: positive for dyspnea on exertion and pleurisy/chest pain Cardiovascular: negative Gastrointestinal: negative Genitourinary:negative Integument/breast: negative Hematologic/lymphatic: negative Musculoskeletal:negative Neurological: negative Behavioral/Psych: negative Endocrine: negative Allergic/Immunologic: negative   PHYSICAL EXAMINATION: General appearance: alert, cooperative, fatigued and no distress Head: Normocephalic, without obvious abnormality, atraumatic Neck: no adenopathy Lymph nodes: Cervical, supraclavicular, and axillary nodes normal. Resp: clear to auscultation bilaterally Back: symmetric, no curvature. ROM normal. No CVA tenderness. Cardio: regular rate and rhythm, S1, S2 normal, no murmur, click, rub or gallop GI: soft, non-tender; bowel sounds normal; no masses,  no organomegaly Extremities: Right below knee amputation Neurologic: Alert and oriented X 3, normal strength and tone. Normal symmetric reflexes. Normal coordination and gait  ECOG PERFORMANCE STATUS: 1 - Symptomatic but completely ambulatory  Blood pressure 140/79, pulse 97, temperature 97.9 F (36.6 C), temperature source Oral, resp. rate 18, height '5\' 5"'$  (1.651 m), weight 121 lb 11.2 oz (55.203 kg), SpO2 100 %.  LABORATORY DATA: Lab Results  Component Value Date   WBC 8.1 09/30/2014   HGB 11.8* 09/30/2014   HCT 36.0* 09/30/2014   MCV 85.1 09/30/2014   PLT 693* 09/30/2014      Chemistry      Component Value Date/Time   NA 137 09/30/2014 1114   NA 138 07/24/2013 1454   NA 139 07/20/2011 1312   K 4.1 09/30/2014 1114   K 4.4  07/24/2013 1454   K 4.8* 07/20/2011 1312   CL 101 07/24/2013 1454   CL 104 07/19/2012 1432   CL 102 07/20/2011 1312   CO2 25 09/30/2014 1114   CO2 23 07/24/2013 1454   CO2 28 07/20/2011 1312   BUN 9.5 09/30/2014 1114   BUN 11 07/24/2013 1454   BUN 9 07/20/2011 1312   CREATININE 0.7 09/30/2014 1114   CREATININE 0.86 07/24/2013 1454   CREATININE 1.3* 07/20/2011 1312      Component Value Date/Time   CALCIUM 9.7 09/30/2014 1114   CALCIUM 9.4 07/24/2013 1454   CALCIUM 8.9 07/20/2011 1312   ALKPHOS 99 09/30/2014 1114   ALKPHOS 113 07/24/2013 1454   ALKPHOS 88* 07/20/2011 1312   AST 11 09/30/2014 1114   AST 14 07/24/2013 1454   AST 21 07/20/2011 1312   ALT 11 09/30/2014 1114   ALT 14 07/24/2013 1454   ALT 27 07/20/2011 1312   BILITOT 0.30 09/30/2014 1114   BILITOT 0.4 07/24/2013 1454   BILITOT 0.60 07/20/2011 1312       RADIOGRAPHIC STUDIES: No results found.  ASSESSMENT AND PLAN: This is a very pleasant 64 years old [place] American male with history of stage  IIIa non-small cell lung cancer status post concurrent chemoradiation followed by 3 cycles of consolidation chemotherapy and has been observation since May of 2010 with no evidence for disease progression. The patient has been observation for the last 6 years with no evidence for progression but the recent CT scan of the chest followed by PET scan showed hypermetabolic left upper lobe lung mass consistent with bronchogenic carcinoma in addition to hypermetabolic left hilar and prevascular lymph node. The final pathology was consistent with invasive squamous cell carcinoma. The previously treated right upper lobe and right hilar lymphadenopathy showed no hypermetabolic activity consistent with good response to the previous concurrent chemoradiation. He is currently being treated with a course of concurrent chemoradiation with weekly carboplatin for AUC of 2 and paclitaxel 45 MG/M2. His last fraction of radiation is scheduled  for 11/07/2014. He will continue his course of concurrent chemoradiation as scheduled. He will follow-up in 2 weeks for another symptom management visit.  He was advised to call immediately if she has any concerning symptoms in the interval.  All questions were answered. The patient knows to call the clinic with any problems, questions or concerns. We can certainly see the patient much sooner if necessary.  Carlton Adam, PA-C 09/30/2014   Disclaimer: This note was dictated with voice recognition software. Similar sounding words can inadvertently be transcribed and may not be corrected upon review.

## 2014-09-30 NOTE — Telephone Encounter (Signed)
Gave adn pritned appt sched and avs for pt for Aug thru OCT

## 2014-10-01 ENCOUNTER — Ambulatory Visit
Admission: RE | Admit: 2014-10-01 | Discharge: 2014-10-01 | Disposition: A | Discharge status: Home / Self Care | Payer: Medicare Other | Source: Ambulatory Visit | Attending: Radiation Oncology | Admitting: Radiation Oncology

## 2014-10-01 ENCOUNTER — Ambulatory Visit: Payer: Medicare Other

## 2014-10-01 DIAGNOSIS — Z510 Encounter for antineoplastic radiation therapy: Secondary | ICD-10-CM | POA: Diagnosis not present

## 2014-10-01 DIAGNOSIS — C3412 Malignant neoplasm of upper lobe, left bronchus or lung: Secondary | ICD-10-CM | POA: Diagnosis not present

## 2014-10-01 DIAGNOSIS — R1310 Dysphagia, unspecified: Secondary | ICD-10-CM | POA: Diagnosis not present

## 2014-10-01 DIAGNOSIS — R05 Cough: Secondary | ICD-10-CM | POA: Diagnosis not present

## 2014-10-01 DIAGNOSIS — Z87891 Personal history of nicotine dependence: Secondary | ICD-10-CM | POA: Diagnosis not present

## 2014-10-01 DIAGNOSIS — Z85118 Personal history of other malignant neoplasm of bronchus and lung: Secondary | ICD-10-CM | POA: Diagnosis not present

## 2014-10-02 ENCOUNTER — Ambulatory Visit: Payer: Medicare Other

## 2014-10-02 ENCOUNTER — Ambulatory Visit
Admission: RE | Admit: 2014-10-02 | Discharge: 2014-10-02 | Disposition: A | Discharge status: Home / Self Care | Payer: Medicare Other | Source: Ambulatory Visit | Attending: Radiation Oncology | Admitting: Radiation Oncology

## 2014-10-02 DIAGNOSIS — R1310 Dysphagia, unspecified: Secondary | ICD-10-CM | POA: Diagnosis not present

## 2014-10-02 DIAGNOSIS — Z85118 Personal history of other malignant neoplasm of bronchus and lung: Secondary | ICD-10-CM | POA: Diagnosis not present

## 2014-10-02 DIAGNOSIS — Z87891 Personal history of nicotine dependence: Secondary | ICD-10-CM | POA: Diagnosis not present

## 2014-10-02 DIAGNOSIS — Z510 Encounter for antineoplastic radiation therapy: Secondary | ICD-10-CM | POA: Diagnosis not present

## 2014-10-02 DIAGNOSIS — R05 Cough: Secondary | ICD-10-CM | POA: Diagnosis not present

## 2014-10-02 DIAGNOSIS — C3412 Malignant neoplasm of upper lobe, left bronchus or lung: Secondary | ICD-10-CM | POA: Diagnosis not present

## 2014-10-03 ENCOUNTER — Ambulatory Visit: Payer: Medicare Other

## 2014-10-03 ENCOUNTER — Ambulatory Visit
Admission: RE | Admit: 2014-10-03 | Discharge: 2014-10-03 | Disposition: A | Discharge status: Home / Self Care | Payer: Medicare Other | Source: Ambulatory Visit | Attending: Radiation Oncology | Admitting: Radiation Oncology

## 2014-10-03 DIAGNOSIS — C3412 Malignant neoplasm of upper lobe, left bronchus or lung: Secondary | ICD-10-CM | POA: Diagnosis not present

## 2014-10-03 DIAGNOSIS — Z87891 Personal history of nicotine dependence: Secondary | ICD-10-CM | POA: Diagnosis not present

## 2014-10-03 DIAGNOSIS — R1310 Dysphagia, unspecified: Secondary | ICD-10-CM | POA: Diagnosis not present

## 2014-10-03 DIAGNOSIS — Z85118 Personal history of other malignant neoplasm of bronchus and lung: Secondary | ICD-10-CM | POA: Diagnosis not present

## 2014-10-03 DIAGNOSIS — R05 Cough: Secondary | ICD-10-CM | POA: Diagnosis not present

## 2014-10-03 DIAGNOSIS — Z510 Encounter for antineoplastic radiation therapy: Secondary | ICD-10-CM | POA: Diagnosis not present

## 2014-10-04 ENCOUNTER — Ambulatory Visit
Admission: RE | Admit: 2014-10-04 | Discharge: 2014-10-04 | Disposition: A | Discharge status: Home / Self Care | Payer: Medicare Other | Source: Ambulatory Visit | Attending: Radiation Oncology | Admitting: Radiation Oncology

## 2014-10-04 ENCOUNTER — Encounter: Payer: Self-pay | Admitting: Radiation Oncology

## 2014-10-04 ENCOUNTER — Other Ambulatory Visit: Payer: Self-pay | Admitting: *Deleted

## 2014-10-04 ENCOUNTER — Ambulatory Visit: Payer: Medicare Other

## 2014-10-04 VITALS — BP 139/86 | HR 98 | Resp 18 | Wt 117.5 lb

## 2014-10-04 DIAGNOSIS — C3412 Malignant neoplasm of upper lobe, left bronchus or lung: Secondary | ICD-10-CM

## 2014-10-04 DIAGNOSIS — Z85118 Personal history of other malignant neoplasm of bronchus and lung: Secondary | ICD-10-CM | POA: Diagnosis not present

## 2014-10-04 DIAGNOSIS — R05 Cough: Secondary | ICD-10-CM | POA: Diagnosis not present

## 2014-10-04 DIAGNOSIS — Z87891 Personal history of nicotine dependence: Secondary | ICD-10-CM | POA: Diagnosis not present

## 2014-10-04 DIAGNOSIS — R1310 Dysphagia, unspecified: Secondary | ICD-10-CM | POA: Diagnosis not present

## 2014-10-04 DIAGNOSIS — C3411 Malignant neoplasm of upper lobe, right bronchus or lung: Secondary | ICD-10-CM

## 2014-10-04 DIAGNOSIS — Z510 Encounter for antineoplastic radiation therapy: Secondary | ICD-10-CM | POA: Diagnosis not present

## 2014-10-04 NOTE — Progress Notes (Signed)
  Radiation Oncology         [phone]   Name: Evan [name] MRN: [id number]   Date: 10/04/2014  DOB: [birth year]     Weekly Radiation Therapy Management    ICD-9-CM ICD-10-CM   1. 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, stage III - 2016 162.3 C34.12     Current Dose: 20 Gy  Planned Dose:  66 Gy  Narrative The patient presents for routine under treatment assessment. Vitals stable. Four pound weight loss noted. Denies pain. Reports headaches this week have been mild. Reports mild nausea earlier in the week that has seen resolved. Denies vomiting, diplopia or ringing in the ears. Reports a productive cough with clear sputum. Denies hemoptysis. Denies difficult or painful swallowing. Denies SOB. No skin changes within treatment field noted. Reports using radiaplex as directed. The patient is without complaint. Set-up films were reviewed. The chart was checked.  Physical Findings  weight is 117 lb 8 oz (53.298 kg). His blood pressure is 139/86 and his pulse is 98. His respiration is 18 and oxygen saturation is 100%. . Weight essentially stable.  No significant changes.  Impression The patient is tolerating radiation.  Plan Continue treatment as planned.    This document serves as a record of services personally performed by Tyler Pita, MD. It was created on his behalf by Arlyce Harman, a trained medical scribe. The creation of this record is based on the scribe's personal observations and the provider's statements to them. This document has been checked and approved by the attending provider.       [name] [name], M.D.

## 2014-10-04 NOTE — Progress Notes (Signed)
Vitals stable. Four pound weight loss noted. Denies pain. Reports headaches this week have been mild. Reports mild nausea earlier in the week that has seen resolved. Denies vomiting, diplopia or ringing in the ears. Reports a productive cough with clear sputum. Denies hemoptysis. Denies difficult or painful swallowing. Denies SOB. No skin changes within treatment field noted. Reports using radiaplex as directed.  BP 139/86 mmHg  Pulse 98  Resp 18  Wt 117 lb 8 oz (53.298 kg)  SpO2 100% Wt Readings from Last 3 Encounters:  10/04/14 117 lb 8 oz (53.298 kg)  09/30/14 121 lb 11.2 oz (55.203 kg)  09/27/14 122 lb (55.339 kg)

## 2014-10-04 NOTE — Patient Instructions (Signed)
Continue your course of concurrent chemoradiation as scheduled Followup in 2 weeks 

## 2014-10-08 ENCOUNTER — Other Ambulatory Visit (HOSPITAL_BASED_OUTPATIENT_CLINIC_OR_DEPARTMENT_OTHER): Payer: Medicare Other

## 2014-10-08 ENCOUNTER — Ambulatory Visit (HOSPITAL_BASED_OUTPATIENT_CLINIC_OR_DEPARTMENT_OTHER): Payer: Medicare Other

## 2014-10-08 ENCOUNTER — Ambulatory Visit
Admission: RE | Admit: 2014-10-08 | Discharge: 2014-10-08 | Disposition: A | Discharge status: Home / Self Care | Payer: Medicare Other | Source: Ambulatory Visit | Attending: Radiation Oncology | Admitting: Radiation Oncology

## 2014-10-08 VITALS — BP 136/80 | HR 91 | Temp 97.0°F

## 2014-10-08 DIAGNOSIS — C3412 Malignant neoplasm of upper lobe, left bronchus or lung: Secondary | ICD-10-CM

## 2014-10-08 DIAGNOSIS — C3492 Malignant neoplasm of unspecified part of left bronchus or lung: Secondary | ICD-10-CM

## 2014-10-08 DIAGNOSIS — Z510 Encounter for antineoplastic radiation therapy: Secondary | ICD-10-CM | POA: Diagnosis not present

## 2014-10-08 DIAGNOSIS — Z85118 Personal history of other malignant neoplasm of bronchus and lung: Secondary | ICD-10-CM | POA: Diagnosis not present

## 2014-10-08 DIAGNOSIS — R05 Cough: Secondary | ICD-10-CM | POA: Diagnosis not present

## 2014-10-08 DIAGNOSIS — Z87891 Personal history of nicotine dependence: Secondary | ICD-10-CM | POA: Diagnosis not present

## 2014-10-08 DIAGNOSIS — Z5111 Encounter for antineoplastic chemotherapy: Secondary | ICD-10-CM

## 2014-10-08 DIAGNOSIS — R1310 Dysphagia, unspecified: Secondary | ICD-10-CM | POA: Diagnosis not present

## 2014-10-08 LAB — CBC WITH DIFFERENTIAL/PLATELET
BASO%: 1 % (ref 0.0–2.0)
BASOS ABS: 0.1 10*3/uL (ref 0.0–0.1)
EOS ABS: 0.1 10*3/uL (ref 0.0–0.5)
EOS%: 1.1 % (ref 0.0–7.0)
HEMATOCRIT: 35.8 % — AB (ref 38.4–49.9)
HEMOGLOBIN: 11.6 g/dL — AB (ref 13.0–17.1)
LYMPH#: 0.9 10*3/uL (ref 0.9–3.3)
LYMPH%: 13.9 % — ABNORMAL LOW (ref 14.0–49.0)
MCH: 27.8 pg (ref 27.2–33.4)
MCHC: 32.4 g/dL (ref 32.0–36.0)
MCV: 85.8 fL (ref 79.3–98.0)
MONO#: 0.4 10*3/uL (ref 0.1–0.9)
MONO%: 6 % (ref 0.0–14.0)
NEUT#: 5.3 10*3/uL (ref 1.5–6.5)
NEUT%: 78 % — ABNORMAL HIGH (ref 39.0–75.0)
Platelets: 692 10*3/uL — ABNORMAL HIGH (ref 140–400)
RBC: 4.18 10*6/uL — ABNORMAL LOW (ref 4.20–5.82)
RDW: 17.6 % — AB (ref 11.0–14.6)
WBC: 6.8 10*3/uL (ref 4.0–10.3)

## 2014-10-08 LAB — COMPREHENSIVE METABOLIC PANEL (CC13)
ALBUMIN: 3.2 g/dL — AB (ref 3.5–5.0)
ALK PHOS: 111 U/L (ref 40–150)
ALT: 18 U/L (ref 0–55)
AST: 13 U/L (ref 5–34)
Anion Gap: 8 mEq/L (ref 3–11)
BUN: 10.1 mg/dL (ref 7.0–26.0)
CALCIUM: 9.6 mg/dL (ref 8.4–10.4)
CO2: 25 mEq/L (ref 22–29)
CREATININE: 0.8 mg/dL (ref 0.7–1.3)
Chloride: 103 mEq/L (ref 98–109)
EGFR: >90 mL/min/{1.73_m2} (ref >90)
Glucose: 104 mg/dl (ref 70–140)
POTASSIUM: 4.3 meq/L (ref 3.5–5.1)
Sodium: 137 mEq/L (ref 136–145)
Total Protein: 7.6 g/dL (ref 6.4–8.3)

## 2014-10-08 MED ORDER — DIPHENHYDRAMINE HCL 50 MG/ML IJ SOLN
50.0000 mg | Freq: Once | INTRAMUSCULAR | Status: AC
  Administered 2014-10-08: 50 mg via INTRAVENOUS

## 2014-10-08 MED ORDER — DEXAMETHASONE SODIUM PHOSPHATE 100 MG/10ML IJ SOLN
Freq: Once | INTRAMUSCULAR | Status: AC
  Administered 2014-10-08: 13:00:00 via INTRAVENOUS
  Filled 2014-10-08: qty 8

## 2014-10-08 MED ORDER — DIPHENHYDRAMINE HCL 50 MG/ML IJ SOLN
INTRAMUSCULAR | Status: AC
  Filled 2014-10-08: qty 1

## 2014-10-08 MED ORDER — PACLITAXEL CHEMO INJECTION 300 MG/50ML
45.0000 mg/m2 | Freq: Once | INTRAVENOUS | Status: AC
  Administered 2014-10-08: 72 mg via INTRAVENOUS
  Filled 2014-10-08: qty 12

## 2014-10-08 MED ORDER — FAMOTIDINE IN NACL 20-0.9 MG/50ML-% IV SOLN
INTRAVENOUS | Status: AC
  Filled 2014-10-08: qty 50

## 2014-10-08 MED ORDER — SODIUM CHLORIDE 0.9 % IV SOLN
Freq: Once | INTRAVENOUS | Status: AC
  Administered 2014-10-08: 13:00:00 via INTRAVENOUS

## 2014-10-08 MED ORDER — FAMOTIDINE IN NACL 20-0.9 MG/50ML-% IV SOLN
20.0000 mg | Freq: Once | INTRAVENOUS | Status: AC
  Administered 2014-10-08: 20 mg via INTRAVENOUS

## 2014-10-08 MED ORDER — SODIUM CHLORIDE 0.9 % IV SOLN
200.0000 mg | Freq: Once | INTRAVENOUS | Status: AC
  Administered 2014-10-08: 200 mg via INTRAVENOUS
  Filled 2014-10-08: qty 20

## 2014-10-08 NOTE — Patient Instructions (Signed)
[facility] Discharge Instructions for Patients Receiving Chemotherapy  Today you received the following chemotherapy agents taxol, carboplatin  To help prevent nausea and vomiting after your treatment, we encourage you to take your nausea medication    If you develop nausea and vomiting that is not controlled by your nausea medication, call the clinic.   BELOW ARE SYMPTOMS THAT SHOULD BE REPORTED IMMEDIATELY:  *FEVER GREATER THAN 100.5 F  *CHILLS WITH OR WITHOUT FEVER  NAUSEA AND VOMITING THAT IS NOT CONTROLLED WITH YOUR NAUSEA MEDICATION  *UNUSUAL SHORTNESS OF BREATH  *UNUSUAL BRUISING OR BLEEDING  TENDERNESS IN MOUTH AND THROAT WITH OR WITHOUT PRESENCE OF ULCERS  *URINARY PROBLEMS  *BOWEL PROBLEMS  UNUSUAL RASH Items with * indicate a potential emergency and should be followed up as soon as possible.  Feel free to call the clinic you have any questions or concerns. The clinic phone number is [phone].  Please show the [facility] at check-in to the Emergency Department and triage nurse.

## 2014-10-09 ENCOUNTER — Ambulatory Visit
Admission: RE | Admit: 2014-10-09 | Discharge: 2014-10-09 | Disposition: A | Discharge status: Home / Self Care | Payer: Medicare Other | Source: Ambulatory Visit | Attending: Radiation Oncology | Admitting: Radiation Oncology

## 2014-10-09 DIAGNOSIS — Z85118 Personal history of other malignant neoplasm of bronchus and lung: Secondary | ICD-10-CM | POA: Diagnosis not present

## 2014-10-09 DIAGNOSIS — Z87891 Personal history of nicotine dependence: Secondary | ICD-10-CM | POA: Diagnosis not present

## 2014-10-09 DIAGNOSIS — R1310 Dysphagia, unspecified: Secondary | ICD-10-CM | POA: Diagnosis not present

## 2014-10-09 DIAGNOSIS — Z510 Encounter for antineoplastic radiation therapy: Secondary | ICD-10-CM | POA: Diagnosis not present

## 2014-10-09 DIAGNOSIS — R05 Cough: Secondary | ICD-10-CM | POA: Diagnosis not present

## 2014-10-09 DIAGNOSIS — C3412 Malignant neoplasm of upper lobe, left bronchus or lung: Secondary | ICD-10-CM | POA: Diagnosis not present

## 2014-10-10 ENCOUNTER — Ambulatory Visit
Admission: RE | Admit: 2014-10-10 | Discharge: 2014-10-10 | Disposition: A | Discharge status: Home / Self Care | Payer: Medicare Other | Source: Ambulatory Visit | Attending: Radiation Oncology | Admitting: Radiation Oncology

## 2014-10-10 DIAGNOSIS — R1310 Dysphagia, unspecified: Secondary | ICD-10-CM | POA: Diagnosis not present

## 2014-10-10 DIAGNOSIS — Z85118 Personal history of other malignant neoplasm of bronchus and lung: Secondary | ICD-10-CM | POA: Diagnosis not present

## 2014-10-10 DIAGNOSIS — C3412 Malignant neoplasm of upper lobe, left bronchus or lung: Secondary | ICD-10-CM | POA: Diagnosis not present

## 2014-10-10 DIAGNOSIS — Z87891 Personal history of nicotine dependence: Secondary | ICD-10-CM | POA: Diagnosis not present

## 2014-10-10 DIAGNOSIS — Z510 Encounter for antineoplastic radiation therapy: Secondary | ICD-10-CM | POA: Diagnosis not present

## 2014-10-10 DIAGNOSIS — R05 Cough: Secondary | ICD-10-CM | POA: Diagnosis not present

## 2014-10-11 ENCOUNTER — Ambulatory Visit: Payer: Medicare Other

## 2014-10-11 ENCOUNTER — Ambulatory Visit: Payer: Medicare Other | Admitting: Pulmonary Disease

## 2014-10-11 ENCOUNTER — Encounter: Payer: Self-pay | Admitting: Pulmonary Disease

## 2014-10-11 ENCOUNTER — Encounter: Payer: Self-pay | Admitting: Radiation Oncology

## 2014-10-11 ENCOUNTER — Ambulatory Visit (INDEPENDENT_AMBULATORY_CARE_PROVIDER_SITE_OTHER): Payer: Medicare Other | Admitting: Pulmonary Disease

## 2014-10-11 ENCOUNTER — Ambulatory Visit
Admission: RE | Admit: 2014-10-11 | Discharge: 2014-10-11 | Disposition: A | Discharge status: Home / Self Care | Payer: Medicare Other | Source: Ambulatory Visit | Attending: Radiation Oncology | Admitting: Radiation Oncology

## 2014-10-11 VITALS — BP 118/82 | HR 93 | Ht 65.0 in | Wt 121.4 lb

## 2014-10-11 VITALS — BP 123/86 | HR 85 | Resp 16

## 2014-10-11 DIAGNOSIS — Z87891 Personal history of nicotine dependence: Secondary | ICD-10-CM | POA: Diagnosis not present

## 2014-10-11 DIAGNOSIS — Z85118 Personal history of other malignant neoplasm of bronchus and lung: Secondary | ICD-10-CM | POA: Diagnosis not present

## 2014-10-11 DIAGNOSIS — C3412 Malignant neoplasm of upper lobe, left bronchus or lung: Secondary | ICD-10-CM | POA: Diagnosis not present

## 2014-10-11 DIAGNOSIS — Z510 Encounter for antineoplastic radiation therapy: Secondary | ICD-10-CM | POA: Diagnosis not present

## 2014-10-11 DIAGNOSIS — R1310 Dysphagia, unspecified: Secondary | ICD-10-CM | POA: Diagnosis not present

## 2014-10-11 DIAGNOSIS — J449 Chronic obstructive pulmonary disease, unspecified: Secondary | ICD-10-CM | POA: Diagnosis not present

## 2014-10-11 DIAGNOSIS — R05 Cough: Secondary | ICD-10-CM | POA: Diagnosis not present

## 2014-10-11 MED ORDER — ALBUTEROL SULFATE HFA 108 (90 BASE) MCG/ACT IN AERS: 2.0000 | INHALATION_SPRAY | Freq: Four times a day (QID) | RESPIRATORY_TRACT | Status: DC | PRN

## 2014-10-11 NOTE — Patient Instructions (Signed)
Albuterol two puffs every 4 to 6 hours as needed for cough, wheezing, chest congestion, or shortness of breath  Follow up with Dr. Elsworth Soho in 4 months

## 2014-10-11 NOTE — Progress Notes (Signed)
Chief Complaint  Patient presents with  . Follow-up    pt states his breathing is better since he has last been here but he has his days wherre its up and down. pt states he has a lot of coughing dry and productive llike i thick gelly texture. pt states he is having some nausea     History of Present Illness: Evan [name] is a 64 y.o. male former smoker with COPD and Stage IIIA NSCLC (Adenocarcinoma) in Rt lung 2010 and Lt lung 2016.  He is normally followed by Dr. Elsworth Soho.  He has been on chemotherapy with carboplatin and paclitaxel.  He has also been getting XRT.  CT chest from 07/25/14 and PET scan from 08/16/14 reviewed.  He has noticed intermittent episodes of nausea.  He takes dill pickles and this helps.  If his nausea is persistent, then he uses compazine.  He is tolerating chemotherapy much better compared to his experience in 2010.  He gets intermittent episodes of cough and feels like he loses his air.  He does not bring up sputum.  He denies hemoptysis.  He was previously on advair and this seemed to help.  He does not use any inhalers currently.  He has noticed discomfort with swallowing after radiation therapy.  His Lt arm pain and numbness are better since starting cancer therapy.   TESTS: PFT 02/08/08 >> FEV1 1.87 (68%), FEV1% 67, TLC 5.6 (88%), DLCO 91%, +BD  PMhx >> Depression, anxiety  Past surgical hx, Medications, Allergies, Family hx, Social hx all reviewed.   Physical Exam: BP 118/82 mmHg  Pulse 93  Ht '5\' 5"'$  (1.651 m)  Wt 121 lb 6.4 oz (55.067 kg)  BMI 20.20 kg/m2  SpO2 97%  General - No distress, thin ENT - No sinus tenderness, no oral exudate, no LAN Cardiac - s1s2 regular, no murmur Chest - No wheeze/rales/dullness Back - No focal tenderness Abd - Soft, non-tender Ext - No edema Neuro - Normal strength Skin - No rashes Psych - normal mood, and behavior   CMP Latest Ref Rng 10/08/2014 09/30/2014 09/23/2014  Glucose 70 - 140 mg/dl 104 97 110  BUN  7.0 - 26.0 mg/dL 10.1 9.5 5.7(L)  Creatinine 0.7 - 1.3 mg/dL 0.8 0.7 0.7  Sodium 136 - 145 mEq/L 137 137 136  Potassium 3.5 - 5.1 mEq/L 4.3 4.1 4.0  Chloride 96 - 112 mEq/L - - -  CO2 22 - 29 mEq/L '25 25 23  '$ Calcium 8.4 - 10.4 mg/dL 9.6 9.7 9.9  Total Protein 6.4 - 8.3 g/dL 7.6 7.9 7.8  Albumin 3.3 - 5.5 g/dL - - -  Total Bilirubin 0.20 - 1.20 mg/dL <0.20 0.30 <0.20  Alkaline Phos 40 - 150 U/L 111 99 108  AST 5 - 34 U/L '13 11 12  '$ ALT 0 - 55 U/L '18 11 15    '$ CBC Latest Ref Rng 10/08/2014 09/30/2014 09/23/2014  WBC 4.0 - 10.3 10e3/uL 6.8 8.1 11.3(H)  Hemoglobin 13.0 - 17.1 g/dL 11.6(L) 11.8(L) 11.6(L)  Hematocrit 38.4 - 49.9 % 35.8(L) 36.0(L) 35.1(L)  Platelets 140 - 400 10e3/uL 692(H) 693(H) 682(H)       Assessment/Plan:  COPD mixed type. Plan: - will have him try albuterol prn - he can be reassessed at [facility] with Dr. Elsworth Soho for maintenance inhaler therapy and whether he needs repeat PFT - he will check with oncology about when he should get flu shot in relation to his chemotherapy  Nausea likely from chemotherapy. Plan: - he  feels dill pickles help >> can continue this - advised him to discuss with oncology if this gets worse  Stage IIIA NSCLC in Lt upper lung. Plan: - f/u with oncology and radiation oncology   Chesley Mires, MD [place] Pager:  [phone]

## 2014-10-11 NOTE — Progress Notes (Signed)
  Radiation Oncology         [phone]   Name: Evan [name] MRN: [id number]   Date: 10/11/2014  DOB: [birth year]     Weekly Radiation Therapy Management    ICD-9-CM ICD-10-CM   1. 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, stage III - 2016 162.3 C34.12     Current Dose: 28 Gy  Planned Dose:  66 Gy  Narrative The patient presents for routine under treatment assessment. Weight and vitals stable. Denies pain. Reports nausea. Reports taking compazine without much relief. Mild hyperpigmentation without desquamation within treatment field. Reports using radiaplex as directed. Reports intermittent dry cough. Reports occasional difficulty swallowing but, "nothing to complain about." Reports some fatigue. The patient is without complaint. Set-up films were reviewed. The chart was checked.  Physical Findings  blood pressure is 123/86 and his pulse is 85. His respiration is 16 and oxygen saturation is 98%. . Weight essentially stable.  No significant changes.  Impression The patient is tolerating radiation.  Plan Continue treatment as planned.    This document serves as a record of services personally performed by Tyler Pita, MD. It was created on his behalf by Arlyce Harman, a trained medical scribe. The creation of this record is based on the scribe's personal observations and the provider's statements to them. This document has been checked and approved by the attending provider.       [name] [name], M.D.

## 2014-10-11 NOTE — Progress Notes (Signed)
Weight and vitals stable. Denies pain. Reports nausea. Reports taking compazine without much relief. Mild hyperpigmentation without desquamation within treatment field. Reports using radiaplex as directed. Reports intermittent dry cough. Reports occasional difficulty swallowing but, "nothing to complain about." Reports some fatigue.       BP 123/86 mmHg  Pulse 85  Resp 16  SpO2 98% Wt Readings from Last 3 Encounters:  10/11/14 121 lb 6.4 oz (55.067 kg)  10/04/14 117 lb 8 oz (53.298 kg)  09/30/14 121 lb 11.2 oz (55.203 kg)

## 2014-10-14 ENCOUNTER — Ambulatory Visit
Admission: RE | Admit: 2014-10-14 | Discharge: 2014-10-14 | Disposition: A | Discharge status: Home / Self Care | Payer: Medicare Other | Source: Ambulatory Visit | Attending: Radiation Oncology | Admitting: Radiation Oncology

## 2014-10-14 DIAGNOSIS — Z87891 Personal history of nicotine dependence: Secondary | ICD-10-CM | POA: Diagnosis not present

## 2014-10-14 DIAGNOSIS — Z510 Encounter for antineoplastic radiation therapy: Secondary | ICD-10-CM | POA: Diagnosis not present

## 2014-10-14 DIAGNOSIS — Z85118 Personal history of other malignant neoplasm of bronchus and lung: Secondary | ICD-10-CM | POA: Diagnosis not present

## 2014-10-14 DIAGNOSIS — R1310 Dysphagia, unspecified: Secondary | ICD-10-CM | POA: Diagnosis not present

## 2014-10-14 DIAGNOSIS — R05 Cough: Secondary | ICD-10-CM | POA: Diagnosis not present

## 2014-10-14 DIAGNOSIS — C3412 Malignant neoplasm of upper lobe, left bronchus or lung: Secondary | ICD-10-CM | POA: Diagnosis not present

## 2014-10-15 ENCOUNTER — Ambulatory Visit
Admission: RE | Admit: 2014-10-15 | Discharge: 2014-10-15 | Disposition: A | Discharge status: Home / Self Care | Payer: Medicare Other | Source: Ambulatory Visit | Attending: Radiation Oncology | Admitting: Radiation Oncology

## 2014-10-15 ENCOUNTER — Ambulatory Visit (HOSPITAL_BASED_OUTPATIENT_CLINIC_OR_DEPARTMENT_OTHER): Payer: Medicare Other | Admitting: Nurse Practitioner

## 2014-10-15 ENCOUNTER — Encounter: Payer: Self-pay | Admitting: Nurse Practitioner

## 2014-10-15 ENCOUNTER — Ambulatory Visit (HOSPITAL_BASED_OUTPATIENT_CLINIC_OR_DEPARTMENT_OTHER): Payer: Medicare Other

## 2014-10-15 ENCOUNTER — Other Ambulatory Visit (HOSPITAL_BASED_OUTPATIENT_CLINIC_OR_DEPARTMENT_OTHER): Payer: Medicare Other

## 2014-10-15 ENCOUNTER — Ambulatory Visit: Payer: Medicare Other | Admitting: Physician Assistant

## 2014-10-15 ENCOUNTER — Ambulatory Visit: Payer: Medicare Other

## 2014-10-15 VITALS — BP 130/89 | HR 78 | Temp 98.4°F | Resp 18 | Ht 65.0 in | Wt 122.3 lb

## 2014-10-15 DIAGNOSIS — C3412 Malignant neoplasm of upper lobe, left bronchus or lung: Secondary | ICD-10-CM

## 2014-10-15 DIAGNOSIS — Z87891 Personal history of nicotine dependence: Secondary | ICD-10-CM | POA: Diagnosis not present

## 2014-10-15 DIAGNOSIS — Z5111 Encounter for antineoplastic chemotherapy: Secondary | ICD-10-CM | POA: Diagnosis present

## 2014-10-15 DIAGNOSIS — R1310 Dysphagia, unspecified: Secondary | ICD-10-CM | POA: Diagnosis not present

## 2014-10-15 DIAGNOSIS — Z85118 Personal history of other malignant neoplasm of bronchus and lung: Secondary | ICD-10-CM | POA: Diagnosis not present

## 2014-10-15 DIAGNOSIS — R0789 Other chest pain: Secondary | ICD-10-CM | POA: Diagnosis not present

## 2014-10-15 DIAGNOSIS — R05 Cough: Secondary | ICD-10-CM | POA: Diagnosis not present

## 2014-10-15 DIAGNOSIS — Z510 Encounter for antineoplastic radiation therapy: Secondary | ICD-10-CM | POA: Diagnosis not present

## 2014-10-15 LAB — COMPREHENSIVE METABOLIC PANEL (CC13)
ALBUMIN: 3.4 g/dL — AB (ref 3.5–5.0)
ALK PHOS: 110 U/L (ref 40–150)
ALT: 13 U/L (ref 0–55)
AST: 11 U/L (ref 5–34)
Anion Gap: 7 mEq/L (ref 3–11)
BUN: 12.6 mg/dL (ref 7.0–26.0)
CO2: 26 mEq/L (ref 22–29)
CREATININE: 0.9 mg/dL (ref 0.7–1.3)
Calcium: 9.6 mg/dL (ref 8.4–10.4)
Chloride: 106 mEq/L (ref 98–109)
EGFR: >90 mL/min/{1.73_m2} (ref >90)
GLUCOSE: 86 mg/dL (ref 70–140)
POTASSIUM: 4.3 meq/L (ref 3.5–5.1)
SODIUM: 139 meq/L (ref 136–145)
TOTAL PROTEIN: 7.8 g/dL (ref 6.4–8.3)
Total Bilirubin: <0.2 mg/dL (ref 0.20–1.20)

## 2014-10-15 LAB — CBC WITH DIFFERENTIAL/PLATELET
BASO%: 0.7 % (ref 0.0–2.0)
BASOS ABS: 0 10*3/uL (ref 0.0–0.1)
EOS%: 1.5 % (ref 0.0–7.0)
Eosinophils Absolute: 0.1 10*3/uL (ref 0.0–0.5)
HCT: 38 % — ABNORMAL LOW (ref 38.4–49.9)
HEMOGLOBIN: 12.4 g/dL — AB (ref 13.0–17.1)
LYMPH%: 17.6 % (ref 14.0–49.0)
MCH: 28.1 pg (ref 27.2–33.4)
MCHC: 32.6 g/dL (ref 32.0–36.0)
MCV: 86.2 fL (ref 79.3–98.0)
MONO#: 0.4 10*3/uL (ref 0.1–0.9)
MONO%: 8.3 % (ref 0.0–14.0)
NEUT#: 3.8 10*3/uL (ref 1.5–6.5)
NEUT%: 71.9 % (ref 39.0–75.0)
Platelets: 514 10*3/uL — ABNORMAL HIGH (ref 140–400)
RBC: 4.41 10*6/uL (ref 4.20–5.82)
RDW: 18.4 % — AB (ref 11.0–14.6)
WBC: 5.3 10*3/uL (ref 4.0–10.3)
lymph#: 0.9 10*3/uL (ref 0.9–3.3)

## 2014-10-15 MED ORDER — DIPHENHYDRAMINE HCL 50 MG/ML IJ SOLN
50.0000 mg | Freq: Once | INTRAMUSCULAR | Status: AC
  Administered 2014-10-15: 50 mg via INTRAVENOUS

## 2014-10-15 MED ORDER — DEXAMETHASONE SODIUM PHOSPHATE 100 MG/10ML IJ SOLN
Freq: Once | INTRAMUSCULAR | Status: AC
  Administered 2014-10-15: 16:00:00 via INTRAVENOUS
  Filled 2014-10-15: qty 8

## 2014-10-15 MED ORDER — DIPHENHYDRAMINE HCL 50 MG/ML IJ SOLN
INTRAMUSCULAR | Status: AC
  Filled 2014-10-15: qty 1

## 2014-10-15 MED ORDER — FAMOTIDINE IN NACL 20-0.9 MG/50ML-% IV SOLN
20.0000 mg | Freq: Once | INTRAVENOUS | Status: AC
Start: 2014-10-15 — End: 2014-10-15
  Administered 2014-10-15: 20 mg via INTRAVENOUS

## 2014-10-15 MED ORDER — SODIUM CHLORIDE 0.9 % IV SOLN
Freq: Once | INTRAVENOUS | Status: AC
  Administered 2014-10-15: 16:00:00 via INTRAVENOUS

## 2014-10-15 MED ORDER — PACLITAXEL CHEMO INJECTION 300 MG/50ML
45.0000 mg/m2 | Freq: Once | INTRAVENOUS | Status: AC
  Administered 2014-10-15: 72 mg via INTRAVENOUS
  Filled 2014-10-15: qty 12

## 2014-10-15 MED ORDER — SODIUM CHLORIDE 0.9 % IV SOLN
200.0000 mg | Freq: Once | INTRAVENOUS | Status: AC
  Administered 2014-10-15: 200 mg via INTRAVENOUS
  Filled 2014-10-15: qty 20

## 2014-10-15 MED ORDER — FAMOTIDINE IN NACL 20-0.9 MG/50ML-% IV SOLN
INTRAVENOUS | Status: AC
  Filled 2014-10-15: qty 50

## 2014-10-15 NOTE — Progress Notes (Signed)
SYMPTOM MANAGEMENT CLINIC   HPI: Evan [name] 64 y.o. male diagnosed with lung cancer.  Currently undergoing carboplatin/Taxol chemotherapy regimen.  Patient received cycle 3 of his carboplatin/Taxol chemotherapy on 10/08/2014.  He denies any issues with fatigue, appetite, or fever/chills.  He also denies any nausea, vomiting, diarrhea, or constipation.    Patient reports that he saw Dr. Halford Chessman cardiothoracic surgeon last week; and had his left Pleurx catheter removed at that time.  He states that he continually has some chronic left chest wall discomfort which is positional; but denies any active chest pain, chest pressure, shortness of breath, or pain with inspiration.  Patient appears well; with lungs clear bilaterally.  Previous Pleurx catheter drain insertion site well-healed.  Blood counts obtained today reveal a WBC of 5.3, ANC 3.8, hemoglobin 12.4, and platelet count 514.  Patient will proceed today with cycle 4 of his carboplatin/Taxol chemotherapy regimen.  He will also continue daily radiation treatments as directed.  Patient has plans to return on 10/22/2014 for labs, visit, and his next cycle of chemotherapy.  He knows to call in the interim with any new worries or concerns.  HPI  ROS  Past Medical History  Diagnosis Date  . Complication of anesthesia     " sometimes I wake up during surgery "  . Anxiety   . Depression   . Lung cancer     non small cell lung cancer    Past Surgical History  Procedure Laterality Date  . Foot amputation Right     traumatic right lower extremity  . Amputation Right 05/21/2012    Procedure: AMPUTATION BELOW KNEE;  Surgeon: Newt Minion, MD;  Location: [facility];  Service: Orthopedics;  Laterality: Right;  . Upper jaw      SURGERY FOR INFECTION  . Colonoscopy w/ biopsies and polypectomy    . 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    has No Known Allergies.    Medication List       This list is accurate as of: 10/15/14  4:34 PM.  Always use your most recent med list.               ADVIL PM PO  Take by mouth.     albuterol 108 (90 BASE) MCG/ACT inhaler  Commonly known as:  PROVENTIL HFA;VENTOLIN HFA  Inhale 2 puffs into the lungs every 6 (six) hours as needed for wheezing or shortness of breath.     HYDROcodone-acetaminophen 5-325 MG per tablet  Commonly known as:  NORCO/VICODIN  Take 1 tablet by mouth every 6 (six) hours as needed for moderate pain.     ibuprofen 200 MG tablet  Commonly known as:  ADVIL,MOTRIN  Take 200 mg by mouth every 6 (six) hours as needed.     OVER THE COUNTER MEDICATION  Take 480 mLs by mouth daily. Joint juice     prochlorperazine 10 MG tablet  Commonly known as:  COMPAZINE  Take 1 tablet (10 mg total) by mouth every 6 (  six) hours as needed for nausea or vomiting.         PHYSICAL EXAMINATION  Oncology Vitals 10/15/2014 10/11/2014 10/11/2014 10/08/2014 10/04/2014 09/30/2014 09/27/2014  Height 165 cm - 165 cm - - 165 cm -  Weight 55.475 kg - 55.067 kg - 53.298 kg 55.203 kg 55.339 kg  Weight (lbs) 122 lbs 5 oz - 121 lbs 6 oz - 117 lbs 8 oz 121 lbs 11 oz 122 lbs  BMI (kg/m2) 20.35 kg/m2 - 20.2 kg/m2 - - 20.25 kg/m2 -  Temp 98.4 - - 97 - 97.9 -  Pulse 78 85 93 91 98 97 97  Resp 18 16 - - _0 Resp (Historical as of 09/02/11) - - - - - - -  SpO2 100 98 97 100 100 100 100  BSA (m2) 1.6 m2 - 1.59 m2 - - 1.59 m2 -   BP Readings from Last 3 Encounters:  10/15/14 130/89  10/11/14  123/86  10/11/14 118/82    Physical Exam  Constitutional: He is oriented to person, place, and time and well-developed, well-nourished, and in no distress.  HENT:  Head: Normocephalic and atraumatic.  Mouth/Throat: Oropharynx is clear and moist.  Eyes: Conjunctivae and EOM are normal. Pupils are equal, round, and reactive to light. Right eye exhibits no discharge. Left eye exhibits no discharge. No scleral icterus.  Neck: Normal range of motion. Neck supple. No JVD present. No tracheal deviation present. No thyromegaly present.  Cardiovascular: Normal rate, regular rhythm, normal heart sounds and intact distal pulses.   Pulmonary/Chest: Effort normal and breath sounds normal. No respiratory distress. He has no wheezes. He has no rales. He exhibits no tenderness.  Left chest Pleurx drainage catheter insertion site well-healed.  No obvious tenderness with palpation to the left lateral rib site.  Abdominal: Soft. Bowel sounds are normal. He exhibits no distension and no mass. There is no tenderness. There is no rebound and no guarding.  Musculoskeletal: Normal range of motion. He exhibits no edema or tenderness.  Lymphadenopathy:    He has no cervical adenopathy.  Neurological: He is alert and oriented to person, place, and time. Gait normal.  Skin: Skin is warm and dry. No rash noted. No erythema. No pallor.  Psychiatric: Affect normal.  Nursing note and vitals reviewed.   LABORATORY DATA:. Appointment on 10/15/2014  Component Date Value Ref Range Status  . WBC 10/15/2014 5.3  4.0 - 10.3 10e3/uL Final  . NEUT# 10/15/2014 3.8  1.5 - 6.5 10e3/uL Final  . HGB 10/15/2014 12.4* 13.0 - 17.1 g/dL Final  . HCT 10/15/2014 38.0* 38.4 - 49.9 % Final  . Platelets 10/15/2014 514* 140 - 400 10e3/uL Final  . MCV 10/15/2014 86.2  79.3 - 98.0 fL Final  . MCH 10/15/2014 28.1  27.2 - 33.4 pg Final  . MCHC 10/15/2014 32.6  32.0 - 36.0 g/dL Final  . RBC 10/15/2014 4.41  4.20 - 5.82 10e6/uL Final  .  RDW 10/15/2014 18.4* 11.0 - 14.6 % Final  . lymph# 10/15/2014 0.9  0.9 - 3.3 10e3/uL Final  . MONO# 10/15/2014 0.4  0.1 - 0.9 10e3/uL Final  . Eosinophils Absolute 10/15/2014 0.1  0.0 - 0.5 10e3/uL Final  . Basophils Absolute 10/15/2014 0.0  0.0 - 0.1 10e3/uL Final  . NEUT% 10/15/2014 71.9  39.0 - 75.0 % Final  . LYMPH% 10/15/2014 17.6  14.0 - 49.0 % Final  . MONO% 10/15/2014 8.3  0.0 - 14.0 % Final  . EOS% 10/15/2014 1.5  0.0 - 7.0 % Final  . BASO% 10/15/2014 0.7  0.0 - 2.0 % Final  . Sodium 10/15/2014 139  136 - 145 mEq/L Final  . Potassium 10/15/2014 4.3  3.5 - 5.1 mEq/L Final  . Chloride 10/15/2014 106  98 - 109 mEq/L Final  . CO2 10/15/2014 26  22 - 29 mEq/L Final  . Glucose 10/15/2014 86  70 - 140 mg/dl Final   Glucose reference range is for nonfasting patients. Fasting glucose reference range is 70- 100.  [name] BUN 10/15/2014 12.6  7.0 - 26.0 mg/dL Final  . Creatinine 10/15/2014 0.9  0.7 - 1.3 mg/dL Final  . Total Bilirubin 10/15/2014 <0.20  0.20 - 1.20 mg/dL Final  . Alkaline Phosphatase 10/15/2014 110  40 - 150 U/L Final  . AST 10/15/2014 11  5 - 34 U/L Final  . ALT 10/15/2014 13  0 - 55 U/L Final  . Total Protein 10/15/2014 7.8  6.4 - 8.3 g/dL Final  . Albumin 10/15/2014 3.4* 3.5 - 5.0 g/dL Final  . Calcium 10/15/2014 9.6  8.4 - 10.4 mg/dL Final  . Anion Gap 10/15/2014 7  3 - 11 mEq/L Final  . EGFR 10/15/2014 >90  >90 ml/min/1.73 m2 Final   eGFR is calculated using the CKD-EPI Creatinine Equation (2009)     RADIOGRAPHIC STUDIES: No results found.  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  He denies any issues with fatigue, appetite, or fever/chills.  He also denies any nausea, vomiting, diarrhea, or constipation.    Patient reports that he saw Dr. Halford Chessman cardiothoracic surgeon last week; and had his left Pleurx catheter removed at that time.  He states that  he continually has some chronic left chest wall discomfort which is positional; but denies any active chest pain, chest pressure, shortness of breath, or pain with inspiration.  Patient appears well; with lungs clear bilaterally.  Previous Pleurx catheter drain insertion site well-healed.  Blood counts obtained today reveal a WBC of 5.3, ANC 3.8, hemoglobin 12.4, and platelet count 514.  Patient will proceed today with cycle 4 of his carboplatin/Taxol chemotherapy regimen.  He will also continue daily radiation treatments as directed.  Patient has plans to return on 10/22/2014 for labs, visit, and his next cycle of chemotherapy.  He knows to call in the interim with any new worries or concerns.   Patient stated understanding of all instructions; and was in agreement with this plan of care. The patient knows to call the clinic with any problems, questions or concerns.   Review/collaboration with Dr. Julien Nordmann regarding all aspects of patient's visit today.   Total time spent with patient was 25 minutes;  with greater than 75 percent of that time spent in face to face counseling regarding patient's symptoms,  and coordination of care and follow up.  Disclaimer:This dictation was prepared with Dragon/digital dictation along with [employer]. Any transcriptional errors that result from this process are unintentional.  Drue Second, NP 10/15/2014

## 2014-10-15 NOTE — Patient Instructions (Signed)
[facility] Discharge Instructions for Patients Receiving Chemotherapy  Today you received the following chemotherapy agents Taxol and Carboplatin  To help prevent nausea and vomiting after your treatment, we encourage you to take your nausea medication Compazine 10 mg every 6 hours as needed.   If you develop nausea and vomiting that is not controlled by your nausea medication, call the clinic.   BELOW ARE SYMPTOMS THAT SHOULD BE REPORTED IMMEDIATELY:  *FEVER GREATER THAN 100.5 F  *CHILLS WITH OR WITHOUT FEVER  NAUSEA AND VOMITING THAT IS NOT CONTROLLED WITH YOUR NAUSEA MEDICATION  *UNUSUAL SHORTNESS OF BREATH  *UNUSUAL BRUISING OR BLEEDING  TENDERNESS IN MOUTH AND THROAT WITH OR WITHOUT PRESENCE OF ULCERS  *URINARY PROBLEMS  *BOWEL PROBLEMS  UNUSUAL RASH Items with * indicate a potential emergency and should be followed up as soon as possible.  Feel free to call the clinic you have any questions or concerns. The clinic phone number is [phone].  Please show the CHEMO ALERT CARD at check-in to the Emergency Department and triage nurse.   

## 2014-10-15 NOTE — Assessment & Plan Note (Signed)
Patient received cycle 3 of his carboplatin/Taxol chemotherapy on 10/08/2014.  He denies any issues with fatigue, appetite, or fever/chills.  He also denies any nausea, vomiting, diarrhea, or constipation.    Patient reports that he saw Dr. Halford Chessman cardiothoracic surgeon last week; and had his left Pleurx catheter removed at that time.  He states that he continually has some chronic left chest wall discomfort which is positional; but denies any active chest pain, chest pressure, shortness of breath, or pain with inspiration.  Patient appears well; with lungs clear bilaterally.  Previous Pleurx catheter drain insertion site well-healed.  Blood counts obtained today reveal a WBC of 5.3, ANC 3.8, hemoglobin 12.4, and platelet count 514.  Patient will proceed today with cycle 4 of his carboplatin/Taxol chemotherapy regimen.  He will also continue daily radiation treatments as directed.  Patient has plans to return on 10/22/2014 for labs, visit, and his next cycle of chemotherapy.  He knows to call in the interim with any new worries or concerns.

## 2014-10-16 ENCOUNTER — Ambulatory Visit
Admission: RE | Admit: 2014-10-16 | Discharge: 2014-10-16 | Disposition: A | Discharge status: Home / Self Care | Payer: Medicare Other | Source: Ambulatory Visit | Attending: Radiation Oncology | Admitting: Radiation Oncology

## 2014-10-16 DIAGNOSIS — C3412 Malignant neoplasm of upper lobe, left bronchus or lung: Secondary | ICD-10-CM | POA: Diagnosis not present

## 2014-10-16 DIAGNOSIS — Z85118 Personal history of other malignant neoplasm of bronchus and lung: Secondary | ICD-10-CM | POA: Diagnosis not present

## 2014-10-16 DIAGNOSIS — R1310 Dysphagia, unspecified: Secondary | ICD-10-CM | POA: Diagnosis not present

## 2014-10-16 DIAGNOSIS — R05 Cough: Secondary | ICD-10-CM | POA: Diagnosis not present

## 2014-10-16 DIAGNOSIS — Z87891 Personal history of nicotine dependence: Secondary | ICD-10-CM | POA: Diagnosis not present

## 2014-10-16 DIAGNOSIS — Z510 Encounter for antineoplastic radiation therapy: Secondary | ICD-10-CM | POA: Diagnosis not present

## 2014-10-17 ENCOUNTER — Ambulatory Visit
Admission: RE | Admit: 2014-10-17 | Discharge: 2014-10-17 | Disposition: A | Discharge status: Home / Self Care | Payer: Medicare Other | Source: Ambulatory Visit | Attending: Radiation Oncology | Admitting: Radiation Oncology

## 2014-10-17 DIAGNOSIS — C3412 Malignant neoplasm of upper lobe, left bronchus or lung: Secondary | ICD-10-CM | POA: Diagnosis not present

## 2014-10-17 DIAGNOSIS — R05 Cough: Secondary | ICD-10-CM | POA: Diagnosis not present

## 2014-10-17 DIAGNOSIS — Z510 Encounter for antineoplastic radiation therapy: Secondary | ICD-10-CM | POA: Diagnosis not present

## 2014-10-17 DIAGNOSIS — Z85118 Personal history of other malignant neoplasm of bronchus and lung: Secondary | ICD-10-CM | POA: Diagnosis not present

## 2014-10-17 DIAGNOSIS — Z87891 Personal history of nicotine dependence: Secondary | ICD-10-CM | POA: Diagnosis not present

## 2014-10-17 DIAGNOSIS — R1310 Dysphagia, unspecified: Secondary | ICD-10-CM | POA: Diagnosis not present

## 2014-10-18 ENCOUNTER — Ambulatory Visit: Admission: RE | Admit: 2014-10-18 | Payer: Medicare Other | Source: Ambulatory Visit

## 2014-10-18 ENCOUNTER — Ambulatory Visit
Admission: RE | Admit: 2014-10-18 | Discharge: 2014-10-18 | Disposition: A | Discharge status: Home / Self Care | Payer: Medicare Other | Source: Ambulatory Visit | Attending: Radiation Oncology | Admitting: Radiation Oncology

## 2014-10-18 ENCOUNTER — Encounter: Payer: Self-pay | Admitting: Radiation Oncology

## 2014-10-18 VITALS — BP 133/84 | HR 99 | Resp 16 | Wt 122.0 lb

## 2014-10-18 DIAGNOSIS — C3412 Malignant neoplasm of upper lobe, left bronchus or lung: Secondary | ICD-10-CM

## 2014-10-18 NOTE — Progress Notes (Signed)
Weight and vitals stable. Denies pain. Reports nausea despite taking compazine. Mild hyperpigmentation without desquamation within treatment field. Reports using radiaplex as directed. Reports an intermittent dry cough no worse than prior to starting treatment. Reports difficulty and pain associated with swallowing has increased. Reports mild fatigue.   BP 133/84 mmHg  Pulse 99  Resp 16  Wt 122 lb (55.339 kg)  SpO2 100% Wt Readings from Last 3 Encounters:  10/18/14 122 lb (55.339 kg)  10/15/14 122 lb 4.8 oz (55.475 kg)  10/11/14 121 lb 6.4 oz (55.067 kg)

## 2014-10-18 NOTE — Progress Notes (Signed)
  Radiation Oncology         [phone]   Name: Evan [name] MRN: [id number]   Date: 10/18/2014  DOB: 1950-09-05     Weekly Radiation Therapy Management    ICD-9-CM ICD-10-CM   1. 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 He has completed 19 treatments thus far. The patient's weight and vitals are currently stable. He denies symptoms of pain. He reports nausea and fatigue despite taking the medication Compazine. Mild hyperpigmentation is observed without desquamation within treatment field. He reports using radiaplex as directed. He additionally rports an intermittent dry cough no worse than prior to starting treatment. His difficulty and pain associated with swallowing has increased. "My medication intake is a lot less. I need to get my weight up," the patient stated, pleased with his goals. Set-up films were reviewed. The chart was checked. The patient projected axiety and was not accompanied by family for today's radiation oncology appointment. There was something "wrong with the machine today."   Physical Findings  weight is 122 lb (55.339 kg). His blood pressure is 133/84 and his pulse is 99. His respiration is 16 and oxygen saturation is 100%. . Weight essentially stable. There is no significant changes to the status of the paients overall health to be noted at this time.   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 The patient is tolerating radiation. The patient understands that he can access his appointments and medical records via [place] [facility]. The patient understands the results of his most recent scans in which were reviewed today.   Plan The patient has been advised to continue treatment as planned. The patient is aware of his  follow-up appointment with radiation oncology to take place next week as scheduled. All vocalized questions and concerns have been addressed. If the patient develops any further questions or concerns in regards to her treatment and recovery, she has been encouraged to contact Dr. [name].    This document serves as a record of services personally performed by Tyler Pita, MD. It was created on his behalf by Arlyce Harman, a trained medical scribe. The creation of this record is based on the scribe's personal observations and the provider's statements to them. This document has been checked and approved by the attending provider.     ____________________________________________________   [name]. [name], M.D.

## 2014-10-21 ENCOUNTER — Ambulatory Visit
Admission: RE | Admit: 2014-10-21 | Discharge: 2014-10-21 | Disposition: A | Discharge status: Home / Self Care | Payer: Medicare Other | Source: Ambulatory Visit | Attending: Radiation Oncology | Admitting: Radiation Oncology

## 2014-10-21 DIAGNOSIS — R1310 Dysphagia, unspecified: Secondary | ICD-10-CM | POA: Diagnosis not present

## 2014-10-21 DIAGNOSIS — Z85118 Personal history of other malignant neoplasm of bronchus and lung: Secondary | ICD-10-CM | POA: Diagnosis not present

## 2014-10-21 DIAGNOSIS — C3412 Malignant neoplasm of upper lobe, left bronchus or lung: Secondary | ICD-10-CM | POA: Diagnosis not present

## 2014-10-21 DIAGNOSIS — Z87891 Personal history of nicotine dependence: Secondary | ICD-10-CM | POA: Diagnosis not present

## 2014-10-21 DIAGNOSIS — Z510 Encounter for antineoplastic radiation therapy: Secondary | ICD-10-CM | POA: Diagnosis not present

## 2014-10-21 DIAGNOSIS — R05 Cough: Secondary | ICD-10-CM | POA: Diagnosis not present

## 2014-10-22 ENCOUNTER — Other Ambulatory Visit (HOSPITAL_BASED_OUTPATIENT_CLINIC_OR_DEPARTMENT_OTHER): Payer: Medicare Other

## 2014-10-22 ENCOUNTER — Encounter: Payer: Self-pay | Admitting: Nurse Practitioner

## 2014-10-22 ENCOUNTER — Ambulatory Visit (HOSPITAL_BASED_OUTPATIENT_CLINIC_OR_DEPARTMENT_OTHER): Payer: Medicare Other | Admitting: Nurse Practitioner

## 2014-10-22 ENCOUNTER — Ambulatory Visit
Admission: RE | Admit: 2014-10-22 | Discharge: 2014-10-22 | Disposition: A | Discharge status: Home / Self Care | Payer: Medicare Other | Source: Ambulatory Visit | Attending: Radiation Oncology | Admitting: Radiation Oncology

## 2014-10-22 ENCOUNTER — Telehealth: Payer: Self-pay | Admitting: Internal Medicine

## 2014-10-22 ENCOUNTER — Ambulatory Visit (HOSPITAL_BASED_OUTPATIENT_CLINIC_OR_DEPARTMENT_OTHER): Payer: Medicare Other

## 2014-10-22 VITALS — BP 113/88 | HR 85 | Temp 98.3°F | Resp 18 | Ht 65.0 in | Wt 122.4 lb

## 2014-10-22 DIAGNOSIS — R05 Cough: Secondary | ICD-10-CM | POA: Diagnosis not present

## 2014-10-22 DIAGNOSIS — C3492 Malignant neoplasm of unspecified part of left bronchus or lung: Secondary | ICD-10-CM

## 2014-10-22 DIAGNOSIS — C3412 Malignant neoplasm of upper lobe, left bronchus or lung: Secondary | ICD-10-CM

## 2014-10-22 DIAGNOSIS — Z85118 Personal history of other malignant neoplasm of bronchus and lung: Secondary | ICD-10-CM | POA: Diagnosis not present

## 2014-10-22 DIAGNOSIS — R1310 Dysphagia, unspecified: Secondary | ICD-10-CM | POA: Diagnosis not present

## 2014-10-22 DIAGNOSIS — Z5111 Encounter for antineoplastic chemotherapy: Secondary | ICD-10-CM | POA: Diagnosis present

## 2014-10-22 DIAGNOSIS — Z510 Encounter for antineoplastic radiation therapy: Secondary | ICD-10-CM | POA: Diagnosis not present

## 2014-10-22 DIAGNOSIS — Z87891 Personal history of nicotine dependence: Secondary | ICD-10-CM | POA: Diagnosis not present

## 2014-10-22 LAB — COMPREHENSIVE METABOLIC PANEL (CC13)
ALT: 14 U/L (ref 0–55)
ANION GAP: 6 meq/L (ref 3–11)
AST: 14 U/L (ref 5–34)
Albumin: 3.5 g/dL (ref 3.5–5.0)
Alkaline Phosphatase: 98 U/L (ref 40–150)
BUN: 15.6 mg/dL (ref 7.0–26.0)
CHLORIDE: 102 meq/L (ref 98–109)
CO2: 25 meq/L (ref 22–29)
Calcium: 9.5 mg/dL (ref 8.4–10.4)
Creatinine: 0.8 mg/dL (ref 0.7–1.3)
Glucose: 92 mg/dl (ref 70–140)
POTASSIUM: 4.3 meq/L (ref 3.5–5.1)
Sodium: 134 mEq/L — ABNORMAL LOW (ref 136–145)
Total Bilirubin: <0.3 mg/dL (ref 0.20–1.20)
Total Protein: 7.5 g/dL (ref 6.4–8.3)

## 2014-10-22 LAB — CBC WITH DIFFERENTIAL/PLATELET
BASO%: 0.7 % (ref 0.0–2.0)
Basophils Absolute: 0 10*3/uL (ref 0.0–0.1)
EOS ABS: 0.1 10*3/uL (ref 0.0–0.5)
EOS%: 1.3 % (ref 0.0–7.0)
HCT: 37.4 % — ABNORMAL LOW (ref 38.4–49.9)
HGB: 12.2 g/dL — ABNORMAL LOW (ref 13.0–17.1)
LYMPH%: 16.2 % (ref 14.0–49.0)
MCH: 27.9 pg (ref 27.2–33.4)
MCHC: 32.6 g/dL (ref 32.0–36.0)
MCV: 85.4 fL (ref 79.3–98.0)
MONO#: 0.4 10*3/uL (ref 0.1–0.9)
MONO%: 6.7 % (ref 0.0–14.0)
NEUT#: 4 10*3/uL (ref 1.5–6.5)
NEUT%: 75.1 % — AB (ref 39.0–75.0)
PLATELETS: 372 10*3/uL (ref 140–400)
RBC: 4.38 10*6/uL (ref 4.20–5.82)
RDW: 18.9 % — ABNORMAL HIGH (ref 11.0–14.6)
WBC: 5.4 10*3/uL (ref 4.0–10.3)
lymph#: 0.9 10*3/uL (ref 0.9–3.3)

## 2014-10-22 MED ORDER — DIPHENHYDRAMINE HCL 50 MG/ML IJ SOLN
50.0000 mg | Freq: Once | INTRAMUSCULAR | Status: AC
  Administered 2014-10-22: 50 mg via INTRAVENOUS

## 2014-10-22 MED ORDER — PACLITAXEL CHEMO INJECTION 300 MG/50ML
45.0000 mg/m2 | Freq: Once | INTRAVENOUS | Status: AC
  Administered 2014-10-22: 72 mg via INTRAVENOUS
  Filled 2014-10-22: qty 12

## 2014-10-22 MED ORDER — SODIUM CHLORIDE 0.9 % IV SOLN
Freq: Once | INTRAVENOUS | Status: AC
  Administered 2014-10-22: 16:00:00 via INTRAVENOUS

## 2014-10-22 MED ORDER — SODIUM CHLORIDE 0.9 % IV SOLN
200.0000 mg | Freq: Once | INTRAVENOUS | Status: AC
  Administered 2014-10-22: 200 mg via INTRAVENOUS
  Filled 2014-10-22: qty 20

## 2014-10-22 MED ORDER — FAMOTIDINE IN NACL 20-0.9 MG/50ML-% IV SOLN
20.0000 mg | Freq: Once | INTRAVENOUS | Status: AC
  Administered 2014-10-22: 20 mg via INTRAVENOUS

## 2014-10-22 MED ORDER — FAMOTIDINE IN NACL 20-0.9 MG/50ML-% IV SOLN
INTRAVENOUS | Status: AC
  Filled 2014-10-22: qty 50

## 2014-10-22 MED ORDER — DIPHENHYDRAMINE HCL 50 MG/ML IJ SOLN
INTRAMUSCULAR | Status: AC
  Filled 2014-10-22: qty 1

## 2014-10-22 MED ORDER — SODIUM CHLORIDE 0.9 % IV SOLN
Freq: Once | INTRAVENOUS | Status: AC
  Administered 2014-10-22: 16:00:00 via INTRAVENOUS
  Filled 2014-10-22: qty 8

## 2014-10-22 NOTE — Telephone Encounter (Signed)
Appointments made and avs printed for patient °

## 2014-10-22 NOTE — Patient Instructions (Signed)
[facility] Discharge Instructions for Patients Receiving Chemotherapy  Today you received the following chemotherapy agents Taxol and Carboplatin  To help prevent nausea and vomiting after your treatment, we encourage you to take your nausea medication Compazine 10 mg every 6 hours as needed.   If you develop nausea and vomiting that is not controlled by your nausea medication, call the clinic.   BELOW ARE SYMPTOMS THAT SHOULD BE REPORTED IMMEDIATELY:  *FEVER GREATER THAN 100.5 F  *CHILLS WITH OR WITHOUT FEVER  NAUSEA AND VOMITING THAT IS NOT CONTROLLED WITH YOUR NAUSEA MEDICATION  *UNUSUAL SHORTNESS OF BREATH  *UNUSUAL BRUISING OR BLEEDING  TENDERNESS IN MOUTH AND THROAT WITH OR WITHOUT PRESENCE OF ULCERS  *URINARY PROBLEMS  *BOWEL PROBLEMS  UNUSUAL RASH Items with * indicate a potential emergency and should be followed up as soon as possible.  Feel free to call the clinic you have any questions or concerns. The clinic phone number is [phone].  Please show the CHEMO ALERT CARD at check-in to the Emergency Department and triage nurse.   

## 2014-10-22 NOTE — Progress Notes (Signed)
[facility] [phone]   [phone] [place], [place] [address]  PRINCIPAL DIAGNOSIS: 1) stage IIIA (T2a, N2, M0) non-small cell lung cancer, squamous cell carcinoma diagnosed in August 2016 2)  Stage IIIA (T2 N2 Mx) non-small cell lung cancer, adenocarcinoma, involving the right upper lobe and mediastinal lymphadenopathy diagnosed in June 2010.   PRIOR THERAPY:  1. Status post concurrent chemoradiation with weekly carboplatin and paclitaxel; last dose was given April 08, 2008. 2. Status post 3 cycles of consolidation chemotherapy with docetaxel; last dose was given on Jun 26, 2008.  CURRENT THERAPY: Concurrent chemoradiation with weekly carboplatin for AUC of 2 and paclitaxel 45 MG/M2. First given 09/23/2014. Status post 4 cycles.  INTERVAL HISTORY: Evan [name] 64 y.o. male returns to the clinic today for followup visit. He is currently receiving a course of concurrent chemoradiation and thus far is tolerating his treatment without significant difficulty. He is here to begin cycle 5 today. His main complaints is fatigue. His appetite is only fair, but he drinks ell. He has some mild nausea. He denies fevers or chills. He has shortness of breath and a constant cough, but denies hemoptysis or chest pain. He uses an inhaler PRN. He using advil or tylenol PM for his joint pain.   MEDICAL HISTORY: Past Medical History  Diagnosis Date  . Complication of anesthesia     " sometimes I wake up during surgery "  . Anxiety   . Depression   . Lung cancer     non small cell lung cancer    ALLERGIES:  has No Known Allergies.  MEDICATIONS:  Current Outpatient Prescriptions  Medication Sig Dispense Refill  . diphenhydramine-acetaminophen (TYLENOL PM) 25-500 MG TABS Take 1 tablet by mouth at bedtime as needed.    [name] ibuprofen (ADVIL,MOTRIN) 200 MG tablet Take 200 mg by mouth every 6 (six) hours as  needed.    [name] OVER THE COUNTER MEDICATION Take 480 mLs by mouth daily. Joint juice    . prochlorperazine (COMPAZINE) 10 MG tablet Take 1 tablet (10 mg total) by mouth every 6 (six) hours as needed for nausea or vomiting. 30 tablet 0  . albuterol (PROVENTIL HFA;VENTOLIN HFA) 108 (90 BASE) MCG/ACT inhaler Inhale 2 puffs into the lungs every 6 (six) hours as needed for wheezing or shortness of breath. (Patient not taking: Reported on 10/22/2014) 1 Inhaler 2  . HYDROcodone-acetaminophen (NORCO/VICODIN) 5-325 MG per tablet Take 1 tablet by mouth every 6 (six) hours as needed for moderate pain. (Patient not taking: Reported on 10/22/2014) 30 tablet 0   No current facility-administered medications for this visit.    SURGICAL HISTORY:  Past Surgical History  Procedure Laterality Date  . Foot amputation Right     traumatic right lower extremity  . Amputation Right 05/21/2012    Procedure: AMPUTATION BELOW KNEE;  Surgeon: Newt Minion, MD;  Location: [facility];  Service: Orthopedics;  Laterality: Right;  . Upper jaw      SURGERY FOR INFECTION  . Colonoscopy w/ biopsies and polypectomy    . Amputation Right 06/21/2012    Procedure: Revision AMPUTATION BELOW KNEE Right;  Surgeon: Newt Minion, MD;  Location: [facility];  Service: Orthopedics;  Laterality: Right;  Revision right Below Knee Amputation    REVIEW OF SYSTEMS:  Constitutional: positive for fatigue Eyes: negative Ears, nose, mouth, throat, and face: negative Respiratory: positive for cough, dyspnea on  exertion and pleurisy/chest pain Cardiovascular: negative Gastrointestinal: positive for constipation and nausea Genitourinary:negative Integument/breast: negative Hematologic/lymphatic: negative Musculoskeletal:negative Neurological: negative Behavioral/Psych: negative Endocrine: negative Allergic/Immunologic: negative   PHYSICAL EXAMINATION: General appearance: alert, cooperative, fatigued and no distress Head: Normocephalic, without obvious  abnormality, atraumatic Neck: no adenopathy Lymph nodes: Cervical, supraclavicular, and axillary nodes normal. Resp: clear to auscultation bilaterally Back: symmetric, no curvature. ROM normal. No CVA tenderness. Cardio: regular rate and rhythm, S1, S2 normal, no murmur, click, rub or gallop GI: soft, non-tender; bowel sounds normal; no masses,  no organomegaly Extremities: Right below knee amputation Neurologic: Alert and oriented X 3, normal strength and tone. Normal symmetric reflexes. Normal coordination and gait  ECOG PERFORMANCE STATUS: 1 - Symptomatic but completely ambulatory  Blood pressure 113/88, pulse 85, temperature 98.3 F (36.8 C), temperature source Oral, resp. rate 18, height '5\' 5"'$  (1.651 m), weight 122 lb 6.4 oz (55.52 kg), SpO2 100 %.  LABORATORY DATA: Lab Results  Component Value Date   WBC 5.4 10/22/2014   HGB 12.2* 10/22/2014   HCT 37.4* 10/22/2014   MCV 85.4 10/22/2014   PLT 372 10/22/2014      Chemistry      Component Value Date/Time   NA 139 10/15/2014 1413   NA 138 07/24/2013 1454   NA 139 07/20/2011 1312   K 4.3 10/15/2014 1413   K 4.4 07/24/2013 1454   K 4.8* 07/20/2011 1312   CL 101 07/24/2013 1454   CL 104 07/19/2012 1432   CL 102 07/20/2011 1312   CO2 26 10/15/2014 1413   CO2 23 07/24/2013 1454   CO2 28 07/20/2011 1312   BUN 12.6 10/15/2014 1413   BUN 11 07/24/2013 1454   BUN 9 07/20/2011 1312   CREATININE 0.9 10/15/2014 1413   CREATININE 0.86 07/24/2013 1454   CREATININE 1.3* 07/20/2011 1312      Component Value Date/Time   CALCIUM 9.6 10/15/2014 1413   CALCIUM 9.4 07/24/2013 1454   CALCIUM 8.9 07/20/2011 1312   ALKPHOS 110 10/15/2014 1413   ALKPHOS 113 07/24/2013 1454   ALKPHOS 88* 07/20/2011 1312   AST 11 10/15/2014 1413   AST 14 07/24/2013 1454   AST 21 07/20/2011 1312   ALT 13 10/15/2014 1413   ALT 14 07/24/2013 1454   ALT 27 07/20/2011 1312   BILITOT <0.20 10/15/2014 1413   BILITOT 0.4 07/24/2013 1454   BILITOT 0.60  07/20/2011 1312       RADIOGRAPHIC STUDIES: No results found.  ASSESSMENT AND PLAN: This is a very pleasant 64 years old African American male with history of stage IIIa non-small cell lung cancer status post concurrent chemoradiation followed by 3 cycles of consolidation chemotherapy and has been observation since May of 2010 with no evidence for disease progression. The patient has been observation for the last 6 years with no evidence for progression but the recent CT scan of the chest followed by PET scan showed hypermetabolic left upper lobe lung mass consistent with bronchogenic carcinoma in addition to hypermetabolic left hilar and prevascular lymph node.  The final pathology was consistent with invasive squamous cell carcinoma. The previously treated right upper lobe and right hilar lymphadenopathy showed no hypermetabolic activity consistent with good response to the previous concurrent chemoradiation. He is currently being treated with a course of concurrent chemoradiation with weekly carboplatin for AUC of 2 and paclitaxel 45 MG/M2. He is status post 4 cycles. His last fraction of radiation is scheduled for 11/07/2014. He will continue his course of concurrent  chemoradiation as scheduled. He will follow-up in 2 weeks for another symptom management visit.  He was advised to call immediately if she has any concerning symptoms in the interval.  All questions were answered. The patient knows to call the clinic with any problems, questions or concerns. We can certainly see the patient much sooner if necessary.  Laurie Panda, NP 10/22/2014

## 2014-10-23 ENCOUNTER — Ambulatory Visit
Admission: RE | Admit: 2014-10-23 | Discharge: 2014-10-23 | Disposition: A | Discharge status: Home / Self Care | Payer: Medicare Other | Source: Ambulatory Visit | Attending: Radiation Oncology | Admitting: Radiation Oncology

## 2014-10-23 DIAGNOSIS — R1310 Dysphagia, unspecified: Secondary | ICD-10-CM | POA: Diagnosis not present

## 2014-10-23 DIAGNOSIS — Z510 Encounter for antineoplastic radiation therapy: Secondary | ICD-10-CM | POA: Diagnosis not present

## 2014-10-23 DIAGNOSIS — Z85118 Personal history of other malignant neoplasm of bronchus and lung: Secondary | ICD-10-CM | POA: Diagnosis not present

## 2014-10-23 DIAGNOSIS — C3412 Malignant neoplasm of upper lobe, left bronchus or lung: Secondary | ICD-10-CM | POA: Diagnosis not present

## 2014-10-23 DIAGNOSIS — R05 Cough: Secondary | ICD-10-CM | POA: Diagnosis not present

## 2014-10-23 DIAGNOSIS — Z87891 Personal history of nicotine dependence: Secondary | ICD-10-CM | POA: Diagnosis not present

## 2014-10-24 ENCOUNTER — Ambulatory Visit
Admission: RE | Admit: 2014-10-24 | Discharge: 2014-10-24 | Disposition: A | Discharge status: Home / Self Care | Payer: Medicare Other | Source: Ambulatory Visit | Attending: Radiation Oncology | Admitting: Radiation Oncology

## 2014-10-24 DIAGNOSIS — Z87891 Personal history of nicotine dependence: Secondary | ICD-10-CM | POA: Diagnosis not present

## 2014-10-24 DIAGNOSIS — Z85118 Personal history of other malignant neoplasm of bronchus and lung: Secondary | ICD-10-CM | POA: Diagnosis not present

## 2014-10-24 DIAGNOSIS — R1310 Dysphagia, unspecified: Secondary | ICD-10-CM | POA: Diagnosis not present

## 2014-10-24 DIAGNOSIS — C3412 Malignant neoplasm of upper lobe, left bronchus or lung: Secondary | ICD-10-CM | POA: Diagnosis not present

## 2014-10-24 DIAGNOSIS — R05 Cough: Secondary | ICD-10-CM | POA: Diagnosis not present

## 2014-10-24 DIAGNOSIS — Z510 Encounter for antineoplastic radiation therapy: Secondary | ICD-10-CM | POA: Diagnosis not present

## 2014-10-25 ENCOUNTER — Encounter: Payer: Self-pay | Admitting: Radiation Oncology

## 2014-10-25 ENCOUNTER — Ambulatory Visit
Admission: RE | Admit: 2014-10-25 | Discharge: 2014-10-25 | Disposition: A | Discharge status: Home / Self Care | Payer: Medicare Other | Source: Ambulatory Visit | Attending: Radiation Oncology | Admitting: Radiation Oncology

## 2014-10-25 VITALS — BP 150/90 | HR 91 | Resp 16 | Wt 124.1 lb

## 2014-10-25 DIAGNOSIS — C3412 Malignant neoplasm of upper lobe, left bronchus or lung: Secondary | ICD-10-CM | POA: Diagnosis not present

## 2014-10-25 DIAGNOSIS — R05 Cough: Secondary | ICD-10-CM | POA: Diagnosis not present

## 2014-10-25 DIAGNOSIS — Z85118 Personal history of other malignant neoplasm of bronchus and lung: Secondary | ICD-10-CM | POA: Diagnosis not present

## 2014-10-25 DIAGNOSIS — Z87891 Personal history of nicotine dependence: Secondary | ICD-10-CM | POA: Diagnosis not present

## 2014-10-25 DIAGNOSIS — R1310 Dysphagia, unspecified: Secondary | ICD-10-CM | POA: Diagnosis not present

## 2014-10-25 DIAGNOSIS — Z510 Encounter for antineoplastic radiation therapy: Secondary | ICD-10-CM | POA: Diagnosis not present

## 2014-10-25 NOTE — Progress Notes (Signed)
  Radiation Oncology         [phone]   Name: SACRAMENTO MONDS MRN: [id number]   Date: 10/25/2014  DOB: [birth year]     Weekly Radiation Therapy Management    ICD-9-CM ICD-10-CM   1. 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  Denies pain. Reports a productive cough with sticky sputum. Denies hemoptysis or SOB. Reports nausea persist despite taking compazine. The RN questions if the nausea is related to reflux. Patient states, "last night I felt like my food wouldn't go down into my stomach then, without warning I vomited on my desk."  Set-up films were reviewed. The chart was checked.   Physical Findings  weight is 124 lb 1.6 oz (56.291 kg). His blood pressure is 150/90 and his pulse is 91. His respiration is 16 and oxygen saturation is 100%.  Weight essentially stable. There is no significant changes to the status of the paients overall health to be noted at this time.   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 The patient is tolerating radiation.  I believe that his emesis could be attributed to reflux.  Plan The patient has been advised to continue treatment as planned. The patient is aware of his follow-up appointment with radiation oncology to take place next week as scheduled.  I advised the pt to buy OTC Zantac or Prilosec and see if that helps with his emesis.   This document serves as a record of services personally performed by Tyler Pita, MD. It was created on his behalf by Darcus Austin, a trained medical scribe. The creation of this record is based on the scribe's personal observations and the provider's statements to them. This document has been checked and approved by the attending provider.      ____________________________________________________   [name]. [name], M.D.

## 2014-10-25 NOTE — Progress Notes (Signed)
Weight and vitals stable. Denies pain. Reports a productive cough with sticky sputum. Denies hemoptysis. Denies SOB. Reports nausea persist despite taking compazine. This RN questions if the nausea is related to reflux. Patient states, "last night I felt like my food wouldn't go down into my stomach then, without warning I vomited on my desk." No skin changes noted within treatment.   BP 150/90 mmHg  Pulse 91  Resp 16  Wt 124 lb 1.6 oz (56.291 kg)  SpO2 100% Wt Readings from Last 3 Encounters:  10/25/14 124 lb 1.6 oz (56.291 kg)  10/22/14 122 lb 6.4 oz (55.52 kg)  10/18/14 122 lb (55.339 kg)

## 2014-10-27 ENCOUNTER — Ambulatory Visit: Payer: Medicare Other

## 2014-10-28 ENCOUNTER — Ambulatory Visit
Admission: RE | Admit: 2014-10-28 | Discharge: 2014-10-28 | Disposition: A | Discharge status: Home / Self Care | Payer: Medicare Other | Source: Ambulatory Visit | Attending: Radiation Oncology | Admitting: Radiation Oncology

## 2014-10-28 ENCOUNTER — Ambulatory Visit: Payer: Medicare Other

## 2014-10-28 DIAGNOSIS — Z87891 Personal history of nicotine dependence: Secondary | ICD-10-CM | POA: Diagnosis not present

## 2014-10-28 DIAGNOSIS — Z85118 Personal history of other malignant neoplasm of bronchus and lung: Secondary | ICD-10-CM | POA: Diagnosis not present

## 2014-10-28 DIAGNOSIS — R1310 Dysphagia, unspecified: Secondary | ICD-10-CM | POA: Diagnosis not present

## 2014-10-28 DIAGNOSIS — Z510 Encounter for antineoplastic radiation therapy: Secondary | ICD-10-CM | POA: Diagnosis not present

## 2014-10-28 DIAGNOSIS — C3412 Malignant neoplasm of upper lobe, left bronchus or lung: Secondary | ICD-10-CM | POA: Diagnosis not present

## 2014-10-28 DIAGNOSIS — R05 Cough: Secondary | ICD-10-CM | POA: Diagnosis not present

## 2014-10-29 ENCOUNTER — Other Ambulatory Visit (HOSPITAL_BASED_OUTPATIENT_CLINIC_OR_DEPARTMENT_OTHER): Payer: Medicare Other

## 2014-10-29 ENCOUNTER — Ambulatory Visit (HOSPITAL_BASED_OUTPATIENT_CLINIC_OR_DEPARTMENT_OTHER): Payer: Medicare Other

## 2014-10-29 ENCOUNTER — Ambulatory Visit
Admission: RE | Admit: 2014-10-29 | Discharge: 2014-10-29 | Disposition: A | Discharge status: Home / Self Care | Payer: Medicare Other | Source: Ambulatory Visit | Attending: Radiation Oncology | Admitting: Radiation Oncology

## 2014-10-29 ENCOUNTER — Ambulatory Visit: Payer: Medicare Other

## 2014-10-29 VITALS — BP 134/79 | HR 84 | Temp 98.6°F | Resp 20

## 2014-10-29 DIAGNOSIS — Z510 Encounter for antineoplastic radiation therapy: Secondary | ICD-10-CM | POA: Diagnosis not present

## 2014-10-29 DIAGNOSIS — Z5111 Encounter for antineoplastic chemotherapy: Secondary | ICD-10-CM | POA: Diagnosis present

## 2014-10-29 DIAGNOSIS — C3412 Malignant neoplasm of upper lobe, left bronchus or lung: Secondary | ICD-10-CM

## 2014-10-29 DIAGNOSIS — Z87891 Personal history of nicotine dependence: Secondary | ICD-10-CM | POA: Diagnosis not present

## 2014-10-29 DIAGNOSIS — R1310 Dysphagia, unspecified: Secondary | ICD-10-CM | POA: Diagnosis not present

## 2014-10-29 DIAGNOSIS — C3492 Malignant neoplasm of unspecified part of left bronchus or lung: Secondary | ICD-10-CM

## 2014-10-29 DIAGNOSIS — Z85118 Personal history of other malignant neoplasm of bronchus and lung: Secondary | ICD-10-CM | POA: Diagnosis not present

## 2014-10-29 DIAGNOSIS — R05 Cough: Secondary | ICD-10-CM | POA: Diagnosis not present

## 2014-10-29 LAB — CBC WITH DIFFERENTIAL/PLATELET
BASO%: 1.3 % (ref 0.0–2.0)
Basophils Absolute: 0.1 10*3/uL (ref 0.0–0.1)
EOS ABS: 0 10*3/uL (ref 0.0–0.5)
EOS%: 0.8 % (ref 0.0–7.0)
HCT: 35.7 % — ABNORMAL LOW (ref 38.4–49.9)
HGB: 11.7 g/dL — ABNORMAL LOW (ref 13.0–17.1)
LYMPH%: 10.6 % — AB (ref 14.0–49.0)
MCH: 28.8 pg (ref 27.2–33.4)
MCHC: 32.6 g/dL (ref 32.0–36.0)
MCV: 88.4 fL (ref 79.3–98.0)
MONO#: 0.3 10*3/uL (ref 0.1–0.9)
MONO%: 6.1 % (ref 0.0–14.0)
NEUT#: 3.6 10*3/uL (ref 1.5–6.5)
NEUT%: 81.2 % — AB (ref 39.0–75.0)
PLATELETS: 344 10*3/uL (ref 140–400)
RBC: 4.04 10*6/uL — AB (ref 4.20–5.82)
RDW: 20.2 % — ABNORMAL HIGH (ref 11.0–14.6)
WBC: 4.4 10*3/uL (ref 4.0–10.3)
lymph#: 0.5 10*3/uL — ABNORMAL LOW (ref 0.9–3.3)

## 2014-10-29 LAB — COMPREHENSIVE METABOLIC PANEL (CC13)
ALT: 16 U/L (ref 0–55)
ANION GAP: 7 meq/L (ref 3–11)
AST: 13 U/L (ref 5–34)
Albumin: 3.4 g/dL — ABNORMAL LOW (ref 3.5–5.0)
Alkaline Phosphatase: 96 U/L (ref 40–150)
BUN: 16 mg/dL (ref 7.0–26.0)
CHLORIDE: 105 meq/L (ref 98–109)
CO2: 22 meq/L (ref 22–29)
Calcium: 9 mg/dL (ref 8.4–10.4)
Creatinine: 0.7 mg/dL (ref 0.7–1.3)
Glucose: 102 mg/dl (ref 70–140)
POTASSIUM: 4 meq/L (ref 3.5–5.1)
Sodium: 135 mEq/L — ABNORMAL LOW (ref 136–145)
Total Protein: 7.1 g/dL (ref 6.4–8.3)

## 2014-10-29 MED ORDER — FAMOTIDINE IN NACL 20-0.9 MG/50ML-% IV SOLN
20.0000 mg | Freq: Once | INTRAVENOUS | Status: AC
  Administered 2014-10-29: 20 mg via INTRAVENOUS

## 2014-10-29 MED ORDER — DIPHENHYDRAMINE HCL 50 MG/ML IJ SOLN
INTRAMUSCULAR | Status: AC
  Filled 2014-10-29: qty 1

## 2014-10-29 MED ORDER — PACLITAXEL CHEMO INJECTION 300 MG/50ML
45.0000 mg/m2 | Freq: Once | INTRAVENOUS | Status: AC
  Administered 2014-10-29: 72 mg via INTRAVENOUS
  Filled 2014-10-29: qty 12

## 2014-10-29 MED ORDER — FAMOTIDINE IN NACL 20-0.9 MG/50ML-% IV SOLN
INTRAVENOUS | Status: AC
  Filled 2014-10-29: qty 50

## 2014-10-29 MED ORDER — SODIUM CHLORIDE 0.9 % IV SOLN
Freq: Once | INTRAVENOUS | Status: AC
  Administered 2014-10-29: 14:00:00 via INTRAVENOUS
  Filled 2014-10-29: qty 8

## 2014-10-29 MED ORDER — SODIUM CHLORIDE 0.9 % IV SOLN
200.0000 mg | Freq: Once | INTRAVENOUS | Status: AC
  Administered 2014-10-29: 200 mg via INTRAVENOUS
  Filled 2014-10-29: qty 20

## 2014-10-29 MED ORDER — SODIUM CHLORIDE 0.9 % IV SOLN
Freq: Once | INTRAVENOUS | Status: AC
  Administered 2014-10-29: 14:00:00 via INTRAVENOUS

## 2014-10-29 MED ORDER — DIPHENHYDRAMINE HCL 50 MG/ML IJ SOLN
50.0000 mg | Freq: Once | INTRAMUSCULAR | Status: AC
  Administered 2014-10-29: 50 mg via INTRAVENOUS

## 2014-10-29 NOTE — Patient Instructions (Signed)
[facility] Discharge Instructions for Patients Receiving Chemotherapy  Today you received the following chemotherapy agents:  Taxol and Carboplatin  To help prevent nausea and vomiting after your treatment, we encourage you to take your nausea medication as ordered per MD.   If you develop nausea and vomiting that is not controlled by your nausea medication, call the clinic.   BELOW ARE SYMPTOMS THAT SHOULD BE REPORTED IMMEDIATELY:  *FEVER GREATER THAN 100.5 F  *CHILLS WITH OR WITHOUT FEVER  NAUSEA AND VOMITING THAT IS NOT CONTROLLED WITH YOUR NAUSEA MEDICATION  *UNUSUAL SHORTNESS OF BREATH  *UNUSUAL BRUISING OR BLEEDING  TENDERNESS IN MOUTH AND THROAT WITH OR WITHOUT PRESENCE OF ULCERS  *URINARY PROBLEMS  *BOWEL PROBLEMS  UNUSUAL RASH Items with * indicate a potential emergency and should be followed up as soon as possible.  Feel free to call the clinic you have any questions or concerns. The clinic phone number is [phone].  Please show the CHEMO ALERT CARD at check-in to the Emergency Department and triage nurse.   

## 2014-10-30 ENCOUNTER — Ambulatory Visit
Admission: RE | Admit: 2014-10-30 | Discharge: 2014-10-30 | Disposition: A | Discharge status: Home / Self Care | Payer: Medicare Other | Source: Ambulatory Visit | Attending: Radiation Oncology | Admitting: Radiation Oncology

## 2014-10-30 ENCOUNTER — Ambulatory Visit: Payer: Medicare Other

## 2014-10-30 DIAGNOSIS — R05 Cough: Secondary | ICD-10-CM | POA: Diagnosis not present

## 2014-10-30 DIAGNOSIS — Z85118 Personal history of other malignant neoplasm of bronchus and lung: Secondary | ICD-10-CM | POA: Diagnosis not present

## 2014-10-30 DIAGNOSIS — Z87891 Personal history of nicotine dependence: Secondary | ICD-10-CM | POA: Diagnosis not present

## 2014-10-30 DIAGNOSIS — Z510 Encounter for antineoplastic radiation therapy: Secondary | ICD-10-CM | POA: Diagnosis not present

## 2014-10-30 DIAGNOSIS — C3412 Malignant neoplasm of upper lobe, left bronchus or lung: Secondary | ICD-10-CM | POA: Diagnosis not present

## 2014-10-30 DIAGNOSIS — R1310 Dysphagia, unspecified: Secondary | ICD-10-CM | POA: Diagnosis not present

## 2014-10-31 ENCOUNTER — Ambulatory Visit
Admission: RE | Admit: 2014-10-31 | Discharge: 2014-10-31 | Disposition: A | Discharge status: Home / Self Care | Payer: Medicare Other | Source: Ambulatory Visit | Attending: Radiation Oncology | Admitting: Radiation Oncology

## 2014-10-31 ENCOUNTER — Ambulatory Visit: Payer: Medicare Other

## 2014-10-31 DIAGNOSIS — Z85118 Personal history of other malignant neoplasm of bronchus and lung: Secondary | ICD-10-CM | POA: Diagnosis not present

## 2014-10-31 DIAGNOSIS — Z510 Encounter for antineoplastic radiation therapy: Secondary | ICD-10-CM | POA: Diagnosis not present

## 2014-10-31 DIAGNOSIS — R1310 Dysphagia, unspecified: Secondary | ICD-10-CM | POA: Diagnosis not present

## 2014-10-31 DIAGNOSIS — Z87891 Personal history of nicotine dependence: Secondary | ICD-10-CM | POA: Diagnosis not present

## 2014-10-31 DIAGNOSIS — C3412 Malignant neoplasm of upper lobe, left bronchus or lung: Secondary | ICD-10-CM | POA: Diagnosis not present

## 2014-10-31 DIAGNOSIS — R05 Cough: Secondary | ICD-10-CM | POA: Diagnosis not present

## 2014-11-01 ENCOUNTER — Ambulatory Visit: Payer: Medicare Other

## 2014-11-01 ENCOUNTER — Ambulatory Visit
Admission: RE | Admit: 2014-11-01 | Discharge: 2014-11-01 | Disposition: A | Discharge status: Home / Self Care | Payer: Medicare Other | Source: Ambulatory Visit | Attending: Radiation Oncology | Admitting: Radiation Oncology

## 2014-11-01 ENCOUNTER — Encounter: Payer: Self-pay | Admitting: Radiation Oncology

## 2014-11-01 VITALS — BP 139/89 | HR 56 | Resp 16 | Wt 124.6 lb

## 2014-11-01 DIAGNOSIS — Z87891 Personal history of nicotine dependence: Secondary | ICD-10-CM | POA: Diagnosis not present

## 2014-11-01 DIAGNOSIS — Z85118 Personal history of other malignant neoplasm of bronchus and lung: Secondary | ICD-10-CM | POA: Diagnosis not present

## 2014-11-01 DIAGNOSIS — R1310 Dysphagia, unspecified: Secondary | ICD-10-CM | POA: Diagnosis not present

## 2014-11-01 DIAGNOSIS — Z510 Encounter for antineoplastic radiation therapy: Secondary | ICD-10-CM | POA: Diagnosis not present

## 2014-11-01 DIAGNOSIS — C3412 Malignant neoplasm of upper lobe, left bronchus or lung: Secondary | ICD-10-CM | POA: Diagnosis not present

## 2014-11-01 DIAGNOSIS — R05 Cough: Secondary | ICD-10-CM | POA: Diagnosis not present

## 2014-11-01 NOTE — Progress Notes (Signed)
  Radiation Oncology         [phone]   Name: Evan [name] MRN: [id number]   Date: 11/01/2014  DOB: January 24, 1951     Weekly Radiation Therapy Management    ICD-9-CM ICD-10-CM   1. 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  He denies pain, hemoptysis, and SOB. The patient reports a productive cough with sticky sputum, and nausea persist. "Food don't taste great," but he has been eating, with some reported difficulty swallowing Set-up films were reviewed. The chart was checked. The patient projected a health mental status and was accompanied by family. Ambulatory status is with a cane. He is excited that his last radiation treatment is next week!   Physical Findings  weight is 124 lb 9.6 oz (56.518 kg). His blood pressure is 139/89 and his pulse is 56. His respiration is 16 and oxygen saturation is 97%.  Weight essentially stable. There is no significant changes to the status of overall health to be noted at this time. The patient is alert and oriented.   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   The patient is tolerating radiation. He is pleased and understands the results of his most recent scan. The patient understands that he can access his appointments and medical records via [place].   Plan The patient has been advised to continue treatment as planned. Healthy methods of management in regards to reported symptoms were reviewed in detail. He is aware of his follow-up appointment with radiation oncology to take place next week, as scheduled.  All vocalized questions and concerns have been addressed. If the patient develops any further questions or concerns in regards  to his treatment and recovery, he has been encouraged to contact Dr. [name], MD.    This document serves as a record of services personally performed by Tyler Pita, MD. It was created on his behalf by Lenn Cal, a trained medical scribe. The creation of this record is based on the scribe's personal observations and the provider's statements to them. This document has been checked and approved by the attending provider.    ____________________________________________________   [name]. [name], M.D.

## 2014-11-01 NOTE — Progress Notes (Signed)
Weight and vitals stable. Denies pain. Reports a productive cough with sticky sputum. Denies hemoptysis. Denies SOB. Reports nausea persist. No skin changes note within treatment field.   BP 139/89 mmHg  Pulse 56  Resp 16  Wt 124 lb 9.6 oz (56.518 kg)  SpO2 97% Wt Readings from Last 3 Encounters:  11/01/14 124 lb 9.6 oz (56.518 kg)  10/25/14 124 lb 1.6 oz (56.291 kg)  10/22/14 122 lb 6.4 oz (55.52 kg)

## 2014-11-04 ENCOUNTER — Other Ambulatory Visit (HOSPITAL_BASED_OUTPATIENT_CLINIC_OR_DEPARTMENT_OTHER): Payer: Medicare Other

## 2014-11-04 ENCOUNTER — Ambulatory Visit (HOSPITAL_BASED_OUTPATIENT_CLINIC_OR_DEPARTMENT_OTHER): Payer: Medicare Other | Admitting: Internal Medicine

## 2014-11-04 ENCOUNTER — Ambulatory Visit
Admission: RE | Admit: 2014-11-04 | Discharge: 2014-11-04 | Disposition: A | Discharge status: Home / Self Care | Payer: Medicare Other | Source: Ambulatory Visit | Attending: Radiation Oncology | Admitting: Radiation Oncology

## 2014-11-04 ENCOUNTER — Telehealth: Payer: Self-pay | Admitting: Internal Medicine

## 2014-11-04 ENCOUNTER — Ambulatory Visit (HOSPITAL_BASED_OUTPATIENT_CLINIC_OR_DEPARTMENT_OTHER): Payer: Medicare Other

## 2014-11-04 ENCOUNTER — Encounter: Payer: Self-pay | Admitting: Internal Medicine

## 2014-11-04 VITALS — BP 140/94 | HR 83 | Temp 98.0°F | Resp 18 | Ht 65.0 in | Wt 123.9 lb

## 2014-11-04 DIAGNOSIS — C3412 Malignant neoplasm of upper lobe, left bronchus or lung: Secondary | ICD-10-CM

## 2014-11-04 DIAGNOSIS — Z85118 Personal history of other malignant neoplasm of bronchus and lung: Secondary | ICD-10-CM | POA: Diagnosis not present

## 2014-11-04 DIAGNOSIS — C3492 Malignant neoplasm of unspecified part of left bronchus or lung: Secondary | ICD-10-CM

## 2014-11-04 DIAGNOSIS — Z510 Encounter for antineoplastic radiation therapy: Secondary | ICD-10-CM | POA: Diagnosis not present

## 2014-11-04 DIAGNOSIS — Z5111 Encounter for antineoplastic chemotherapy: Secondary | ICD-10-CM

## 2014-11-04 DIAGNOSIS — R05 Cough: Secondary | ICD-10-CM | POA: Diagnosis not present

## 2014-11-04 DIAGNOSIS — Z9221 Personal history of antineoplastic chemotherapy: Secondary | ICD-10-CM | POA: Diagnosis not present

## 2014-11-04 DIAGNOSIS — R1310 Dysphagia, unspecified: Secondary | ICD-10-CM | POA: Diagnosis not present

## 2014-11-04 DIAGNOSIS — Z7189 Other specified counseling: Secondary | ICD-10-CM | POA: Insufficient documentation

## 2014-11-04 DIAGNOSIS — Z515 Encounter for palliative care: Secondary | ICD-10-CM | POA: Insufficient documentation

## 2014-11-04 DIAGNOSIS — Z87891 Personal history of nicotine dependence: Secondary | ICD-10-CM | POA: Diagnosis not present

## 2014-11-04 DIAGNOSIS — Z923 Personal history of irradiation: Secondary | ICD-10-CM | POA: Diagnosis not present

## 2014-11-04 DIAGNOSIS — Z789 Other specified health status: Secondary | ICD-10-CM

## 2014-11-04 LAB — COMPREHENSIVE METABOLIC PANEL (CC13)
ALT: 17 U/L (ref 0–55)
ANION GAP: 6 meq/L (ref 3–11)
AST: 14 U/L (ref 5–34)
Albumin: 3.4 g/dL — ABNORMAL LOW (ref 3.5–5.0)
Alkaline Phosphatase: 89 U/L (ref 40–150)
BUN: 10.4 mg/dL (ref 7.0–26.0)
CALCIUM: 9 mg/dL (ref 8.4–10.4)
CHLORIDE: 107 meq/L (ref 98–109)
CO2: 23 meq/L (ref 22–29)
CREATININE: 0.7 mg/dL (ref 0.7–1.3)
EGFR: >90 mL/min/{1.73_m2} (ref >90)
Glucose: 107 mg/dl (ref 70–140)
Potassium: 3.6 mEq/L (ref 3.5–5.1)
Sodium: 137 mEq/L (ref 136–145)
TOTAL PROTEIN: 7.1 g/dL (ref 6.4–8.3)

## 2014-11-04 LAB — CBC WITH DIFFERENTIAL/PLATELET
BASO%: 1 % (ref 0.0–2.0)
Basophils Absolute: 0 10*3/uL (ref 0.0–0.1)
EOS%: 1.6 % (ref 0.0–7.0)
Eosinophils Absolute: 0.1 10*3/uL (ref 0.0–0.5)
HEMATOCRIT: 37.4 % — AB (ref 38.4–49.9)
HGB: 12.3 g/dL — ABNORMAL LOW (ref 13.0–17.1)
LYMPH#: 0.4 10*3/uL — AB (ref 0.9–3.3)
LYMPH%: 12.6 % — ABNORMAL LOW (ref 14.0–49.0)
MCH: 29.2 pg (ref 27.2–33.4)
MCHC: 32.8 g/dL (ref 32.0–36.0)
MCV: 88.9 fL (ref 79.3–98.0)
MONO#: 0.2 10*3/uL (ref 0.1–0.9)
MONO%: 5.8 % (ref 0.0–14.0)
NEUT%: 79 % — ABNORMAL HIGH (ref 39.0–75.0)
NEUTROS ABS: 2.5 10*3/uL (ref 1.5–6.5)
PLATELETS: 289 10*3/uL (ref 140–400)
RBC: 4.21 10*6/uL (ref 4.20–5.82)
RDW: 21 % — ABNORMAL HIGH (ref 11.0–14.6)
WBC: 3.2 10*3/uL — AB (ref 4.0–10.3)

## 2014-11-04 MED ORDER — SODIUM CHLORIDE 0.9 % IV SOLN
Freq: Once | INTRAVENOUS | Status: AC
  Administered 2014-11-04: 14:00:00 via INTRAVENOUS

## 2014-11-04 MED ORDER — FAMOTIDINE IN NACL 20-0.9 MG/50ML-% IV SOLN
20.0000 mg | Freq: Once | INTRAVENOUS | Status: AC
  Administered 2014-11-04: 20 mg via INTRAVENOUS

## 2014-11-04 MED ORDER — PACLITAXEL CHEMO INJECTION 300 MG/50ML
45.0000 mg/m2 | Freq: Once | INTRAVENOUS | Status: AC
  Administered 2014-11-04: 72 mg via INTRAVENOUS
  Filled 2014-11-04: qty 12

## 2014-11-04 MED ORDER — FAMOTIDINE IN NACL 20-0.9 MG/50ML-% IV SOLN
INTRAVENOUS | Status: AC
  Filled 2014-11-04: qty 50

## 2014-11-04 MED ORDER — DIPHENHYDRAMINE HCL 50 MG/ML IJ SOLN
INTRAMUSCULAR | Status: AC
  Filled 2014-11-04: qty 1

## 2014-11-04 MED ORDER — DIPHENHYDRAMINE HCL 50 MG/ML IJ SOLN
50.0000 mg | Freq: Once | INTRAMUSCULAR | Status: AC
  Administered 2014-11-04: 50 mg via INTRAVENOUS

## 2014-11-04 MED ORDER — SODIUM CHLORIDE 0.9 % IV SOLN
200.0000 mg | Freq: Once | INTRAVENOUS | Status: AC
  Administered 2014-11-04: 200 mg via INTRAVENOUS
  Filled 2014-11-04: qty 20

## 2014-11-04 MED ORDER — SODIUM CHLORIDE 0.9 % IV SOLN
Freq: Once | INTRAVENOUS | Status: AC
  Administered 2014-11-04: 15:00:00 via INTRAVENOUS
  Filled 2014-11-04: qty 8

## 2014-11-04 NOTE — Patient Instructions (Signed)
[facility] Discharge Instructions for Patients Receiving Chemotherapy  Today you received the following chemotherapy agents taxol, carboplatin  To help prevent nausea and vomiting after your treatment, we encourage you to take your nausea medication   If you develop nausea and vomiting that is not controlled by your nausea medication, call the clinic.   BELOW ARE SYMPTOMS THAT SHOULD BE REPORTED IMMEDIATELY:  *FEVER GREATER THAN 100.5 F  *CHILLS WITH OR WITHOUT FEVER  NAUSEA AND VOMITING THAT IS NOT CONTROLLED WITH YOUR NAUSEA MEDICATION  *UNUSUAL SHORTNESS OF BREATH  *UNUSUAL BRUISING OR BLEEDING  TENDERNESS IN MOUTH AND THROAT WITH OR WITHOUT PRESENCE OF ULCERS  *URINARY PROBLEMS  *BOWEL PROBLEMS  UNUSUAL RASH Items with * indicate a potential emergency and should be followed up as soon as possible.  Feel free to call the clinic you have any questions or concerns. The clinic phone number is [phone].  Please show the [facility] at check-in to the Emergency Department and triage nurse.

## 2014-11-04 NOTE — Telephone Encounter (Signed)
per pof to sch pt appt-gave pt copy of avs °

## 2014-11-04 NOTE — Progress Notes (Signed)
[facility] [phone]   Fax:(336) [place], [place] [address]  PRINCIPAL DIAGNOSIS: 1) stage IIIA (T2a, N2, M0) non-small cell lung cancer, squamous cell carcinoma diagnosed in August 2016 2)  Stage IIIA (T2 N2 Mx) non-small cell lung cancer, adenocarcinoma, involving the right upper lobe and mediastinal lymphadenopathy diagnosed in June 2010.   PRIOR THERAPY:  1. Status post concurrent chemoradiation with weekly carboplatin and paclitaxel; last dose was given April 08, 2008. 2. Status post 3 cycles of consolidation chemotherapy with docetaxel; last dose was given on Jun 26, 2008.  CURRENT THERAPY: Concurrent chemoradiation with weekly carboplatin for AUC of 2 and paclitaxel 45 MG/M2. First dose expected on 09/23/2014. Status post 6 cycles.  INTERVAL HISTORY: Evan [name] 64 y.o. male returns to the clinic today for followup visit. The patient is currently undergoing concurrent chemoradiation with weekly carboplatin and paclitaxel and tolerating his treatment fairly well. He noticed significant improvement in the pain on the left side of the chest. But he has some difficulty swallowing with mild odynophagia. He denied having any cough, shortness of breath or hemoptysis. The patient denied having any significant weight loss or night sweats. He has no nausea or vomiting. He is here today to receive cycle #7 of his concurrent chemoradiation.  MEDICAL HISTORY: Past Medical History  Diagnosis Date  . Complication of anesthesia     " sometimes I wake up during surgery "  . Anxiety   . Depression   . Lung cancer ([place])     non small cell lung cancer    ALLERGIES:  has No Known Allergies.  MEDICATIONS:  Current Outpatient Prescriptions  Medication Sig Dispense Refill  . acetaminophen (TYLENOL) 500 MG tablet Take 500 mg by mouth every 6 (six) hours as needed.    [name] albuterol (PROVENTIL  HFA;VENTOLIN HFA) 108 (90 BASE) MCG/ACT inhaler Inhale 2 puffs into the lungs every 6 (six) hours as needed for wheezing or shortness of breath. 1 Inhaler 2  . prochlorperazine (COMPAZINE) 10 MG tablet Take 1 tablet (10 mg total) by mouth every 6 (six) hours as needed for nausea or vomiting. 30 tablet 0  . diphenhydramine-acetaminophen (TYLENOL PM) 25-500 MG TABS Take 1 tablet by mouth at bedtime as needed.    [name] HYDROcodone-acetaminophen (NORCO/VICODIN) 5-325 MG per tablet Take 1 tablet by mouth every 6 (six) hours as needed for moderate pain. (Patient not taking: Reported on 10/22/2014) 30 tablet 0  . ibuprofen (ADVIL,MOTRIN) 200 MG tablet Take 200 mg by mouth every 6 (six) hours as needed.    [name] OVER THE COUNTER MEDICATION Take 480 mLs by mouth daily. Joint juice     No current facility-administered medications for this visit.    SURGICAL HISTORY:  Past Surgical History  Procedure Laterality Date  . Foot amputation Right     traumatic right lower extremity  . Amputation Right 05/21/2012    Procedure: AMPUTATION BELOW KNEE;  Surgeon: Newt Minion, MD;  Location: [facility];  Service: Orthopedics;  Laterality: Right;  . Upper jaw      SURGERY FOR INFECTION  . Colonoscopy w/ biopsies and polypectomy    . Amputation Right 06/21/2012    Procedure: Revision AMPUTATION BELOW KNEE Right;  Surgeon: Newt Minion, MD;  Location: [facility];  Service: Orthopedics;  Laterality: Right;  Revision right Below Knee Amputation    REVIEW OF SYSTEMS:  Constitutional: positive for fatigue  Eyes: negative Ears, nose, mouth, throat, and face: negative Respiratory: negative Cardiovascular: negative Gastrointestinal: positive for dysphagia and odynophagia Genitourinary:negative Integument/breast: negative Hematologic/lymphatic: negative Musculoskeletal:negative Neurological: negative Behavioral/Psych: negative Endocrine: negative Allergic/Immunologic: negative   PHYSICAL EXAMINATION: General appearance: alert,  cooperative, fatigued and no distress Head: Normocephalic, without obvious abnormality, atraumatic Neck: no adenopathy Lymph nodes: Cervical, supraclavicular, and axillary nodes normal. Resp: clear to auscultation bilaterally Back: symmetric, no curvature. ROM normal. No CVA tenderness. Cardio: regular rate and rhythm, S1, S2 normal, no murmur, click, rub or gallop GI: soft, non-tender; bowel sounds normal; no masses,  no organomegaly Extremities: Right below knee amputation Neurologic: Alert and oriented X 3, normal strength and tone. Normal symmetric reflexes. Normal coordination and gait  ECOG PERFORMANCE STATUS: 1 - Symptomatic but completely ambulatory  Blood pressure 140/94, pulse 83, temperature 98 F (36.7 C), temperature source Oral, resp. rate 18, height '5\' 5"'$  (1.651 m), weight 123 lb 14.4 oz (56.201 kg), SpO2 100 %.  LABORATORY DATA: Lab Results  Component Value Date   WBC 3.2* 11/04/2014   HGB 12.3* 11/04/2014   HCT 37.4* 11/04/2014   MCV 88.9 11/04/2014   PLT 289 11/04/2014      Chemistry      Component Value Date/Time   NA 137 11/04/2014 1027   NA 138 07/24/2013 1454   NA 139 07/20/2011 1312   K 3.6 11/04/2014 1027   K 4.4 07/24/2013 1454   K 4.8* 07/20/2011 1312   CL 101 07/24/2013 1454   CL 104 07/19/2012 1432   CL 102 07/20/2011 1312   CO2 23 11/04/2014 1027   CO2 23 07/24/2013 1454   CO2 28 07/20/2011 1312   BUN 10.4 11/04/2014 1027   BUN 11 07/24/2013 1454   BUN 9 07/20/2011 1312   CREATININE 0.7 11/04/2014 1027   CREATININE 0.86 07/24/2013 1454   CREATININE 1.3* 07/20/2011 1312      Component Value Date/Time   CALCIUM 9.0 11/04/2014 1027   CALCIUM 9.4 07/24/2013 1454   CALCIUM 8.9 07/20/2011 1312   ALKPHOS 89 11/04/2014 1027   ALKPHOS 113 07/24/2013 1454   ALKPHOS 88* 07/20/2011 1312   AST 14 11/04/2014 1027   AST 14 07/24/2013 1454   AST 21 07/20/2011 1312   ALT 17 11/04/2014 1027   ALT 14 07/24/2013 1454   ALT 27 07/20/2011 1312    BILITOT <0.30 11/04/2014 1027   BILITOT 0.4 07/24/2013 1454   BILITOT 0.60 07/20/2011 1312       RADIOGRAPHIC STUDIES: No results found.  ASSESSMENT AND PLAN: This is a very pleasant 64 years old African American male with history of stage IIIa non-small cell lung cancer status post concurrent chemoradiation followed by 3 cycles of consolidation chemotherapy and has been observation since May of 2010 with no evidence for disease progression. The patient is currently undergoing a course of concurrent chemoradiation for recurrent non-small cell lung cancer, squamous cell carcinoma involving the left upper lobe and mediastinal lymphadenopathy. He is status post 6 cycles of concurrent chemoradiation. I recommended for him to proceed with the last cycle of his concurrent chemoradiation today as scheduled. For the dysphagia and odynophagia, I recommended for the patient to continue treatment with Carafate as prescribed by Dr. [name]. I will see the patient back for follow-up visit in 6 weeks for reevaluation after repeating CT scan of the chest for restaging of his disease. CODE STATUS was discussed with the patient and he requested a full CODE STATUS. The patient was advised to call  immediately if he has any concerning symptoms in the interval. He was advised to call immediately if she has any concerning symptoms in the interval.  All questions were answered. The patient knows to call the clinic with any problems, questions or concerns. We can certainly see the patient much sooner if necessary.  Disclaimer: This note was dictated with voice recognition software. Similar sounding words can inadvertently be transcribed and may not be corrected upon review.

## 2014-11-05 ENCOUNTER — Ambulatory Visit
Admission: RE | Admit: 2014-11-05 | Discharge: 2014-11-05 | Disposition: A | Discharge status: Home / Self Care | Payer: Medicare Other | Source: Ambulatory Visit | Attending: Radiation Oncology | Admitting: Radiation Oncology

## 2014-11-05 DIAGNOSIS — R1310 Dysphagia, unspecified: Secondary | ICD-10-CM | POA: Diagnosis not present

## 2014-11-05 DIAGNOSIS — Z510 Encounter for antineoplastic radiation therapy: Secondary | ICD-10-CM | POA: Diagnosis not present

## 2014-11-05 DIAGNOSIS — C3412 Malignant neoplasm of upper lobe, left bronchus or lung: Secondary | ICD-10-CM | POA: Diagnosis not present

## 2014-11-05 DIAGNOSIS — Z87891 Personal history of nicotine dependence: Secondary | ICD-10-CM | POA: Diagnosis not present

## 2014-11-05 DIAGNOSIS — R05 Cough: Secondary | ICD-10-CM | POA: Diagnosis not present

## 2014-11-05 DIAGNOSIS — Z85118 Personal history of other malignant neoplasm of bronchus and lung: Secondary | ICD-10-CM | POA: Diagnosis not present

## 2014-11-06 ENCOUNTER — Ambulatory Visit
Admission: RE | Admit: 2014-11-06 | Discharge: 2014-11-06 | Disposition: A | Discharge status: Home / Self Care | Payer: Medicare Other | Source: Ambulatory Visit | Attending: Radiation Oncology | Admitting: Radiation Oncology

## 2014-11-06 DIAGNOSIS — Z85118 Personal history of other malignant neoplasm of bronchus and lung: Secondary | ICD-10-CM | POA: Diagnosis not present

## 2014-11-06 DIAGNOSIS — Z87891 Personal history of nicotine dependence: Secondary | ICD-10-CM | POA: Diagnosis not present

## 2014-11-06 DIAGNOSIS — R05 Cough: Secondary | ICD-10-CM | POA: Diagnosis not present

## 2014-11-06 DIAGNOSIS — Z510 Encounter for antineoplastic radiation therapy: Secondary | ICD-10-CM | POA: Diagnosis not present

## 2014-11-06 DIAGNOSIS — R1310 Dysphagia, unspecified: Secondary | ICD-10-CM | POA: Diagnosis not present

## 2014-11-06 DIAGNOSIS — C3412 Malignant neoplasm of upper lobe, left bronchus or lung: Secondary | ICD-10-CM | POA: Diagnosis not present

## 2014-11-07 ENCOUNTER — Ambulatory Visit
Admission: RE | Admit: 2014-11-07 | Discharge: 2014-11-07 | Disposition: A | Discharge status: Home / Self Care | Payer: Medicare Other | Source: Ambulatory Visit | Attending: Radiation Oncology | Admitting: Radiation Oncology

## 2014-11-07 DIAGNOSIS — Z87891 Personal history of nicotine dependence: Secondary | ICD-10-CM | POA: Diagnosis not present

## 2014-11-07 DIAGNOSIS — R05 Cough: Secondary | ICD-10-CM | POA: Diagnosis not present

## 2014-11-07 DIAGNOSIS — Z85118 Personal history of other malignant neoplasm of bronchus and lung: Secondary | ICD-10-CM | POA: Diagnosis not present

## 2014-11-07 DIAGNOSIS — R1310 Dysphagia, unspecified: Secondary | ICD-10-CM | POA: Diagnosis not present

## 2014-11-07 DIAGNOSIS — C3412 Malignant neoplasm of upper lobe, left bronchus or lung: Secondary | ICD-10-CM | POA: Diagnosis not present

## 2014-11-07 DIAGNOSIS — Z510 Encounter for antineoplastic radiation therapy: Secondary | ICD-10-CM | POA: Diagnosis not present

## 2014-11-08 ENCOUNTER — Ambulatory Visit: Payer: Medicare Other

## 2014-11-08 ENCOUNTER — Ambulatory Visit
Admission: RE | Admit: 2014-11-08 | Discharge: 2014-11-08 | Disposition: A | Discharge status: Home / Self Care | Payer: Medicare Other | Source: Ambulatory Visit | Attending: Radiation Oncology | Admitting: Radiation Oncology

## 2014-11-08 ENCOUNTER — Encounter: Payer: Self-pay | Admitting: Radiation Oncology

## 2014-11-08 VITALS — BP 151/94 | HR 88 | Resp 16 | Wt 125.2 lb

## 2014-11-08 DIAGNOSIS — Z510 Encounter for antineoplastic radiation therapy: Secondary | ICD-10-CM | POA: Diagnosis not present

## 2014-11-08 DIAGNOSIS — C3412 Malignant neoplasm of upper lobe, left bronchus or lung: Secondary | ICD-10-CM | POA: Diagnosis not present

## 2014-11-08 DIAGNOSIS — Z85118 Personal history of other malignant neoplasm of bronchus and lung: Secondary | ICD-10-CM | POA: Diagnosis not present

## 2014-11-08 DIAGNOSIS — R1310 Dysphagia, unspecified: Secondary | ICD-10-CM | POA: Diagnosis not present

## 2014-11-08 DIAGNOSIS — R05 Cough: Secondary | ICD-10-CM | POA: Diagnosis not present

## 2014-11-08 DIAGNOSIS — Z87891 Personal history of nicotine dependence: Secondary | ICD-10-CM | POA: Diagnosis not present

## 2014-11-08 NOTE — Progress Notes (Signed)
Weight stable. BP elevated. Patient reports a headache. Reports a productive cough with sticky sputum. Denies hemoptysis. Denies SOB. Reports difficulty and discomfort associated with swallowing. Reports nausea persist. No skin changes note within treatment field. Understands to continue to use radiaplex bid for next two weeks. One month follow up appointment card given.  BP 151/94 mmHg  Pulse 88  Resp 16  Wt 125 lb 3.2 oz (56.79 kg) Wt Readings from Last 3 Encounters:  11/08/14 125 lb 3.2 oz (56.79 kg)  11/04/14 123 lb 14.4 oz (56.201 kg)  11/01/14 124 lb 9.6 oz (56.518 kg)

## 2014-11-08 NOTE — Progress Notes (Signed)
  Radiation Oncology         [phone] ________________________________  Name: Evan [name] MRN: [id number]  Date: 11/08/2014  DOB: [birth year]  Weekly Radiation Therapy Management    ICD-9-CM ICD-10-CM   1. 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 . . . . . . . The patient presents for the final under treatment assessment. The patient has had some continuation of previously noted symptoms. Patient reports a headache, persisting nausea, a productive cough with sticky sputum, and difficulty and discomfort associated with swallowing. Denies hemoptysis and sob. He understands to continue using radiaplex bid for the next two weeks. One month follow up appointment card given.                                 Set-up films were reviewed.                                 The chart was checked. Physical Findings. . Tonette Bihari with BMI 11/08/2014  Height   Weight 125 lbs 3 oz  BMI   Systolic 811  Diastolic 94  Pulse 88  Respirations 16   Weight essentially stable.  No significant changes. Impression . . . . . . . The patient tolerated radiation relatively well. Plan . . . . . . . . . . . . Complete radiation today as scheduled, and follow-up in one month. The patient was encouraged to call or return to the clinic in the interim for any worsening symptoms.  This document serves as a record of services personally performed by Tyler Pita, MD. It was created on his behalf by Darcus Austin, a trained medical scribe. The creation of this record is based on the scribe's personal observations and the provider's statements to them. This document has been checked and approved by the attending provider.     ________________________________  [name]. [name], M.D.

## 2014-11-11 ENCOUNTER — Ambulatory Visit: Payer: Medicare Other

## 2014-11-11 NOTE — Progress Notes (Signed)
  Radiation Oncology         [phone] ________________________________  Name: Evan [name] MRN: [id number]  Date: 11/08/2014  DOB: 15-Jul-1950  End of Treatment Note  Diagnosis:     ICD-9-CM ICD-10-CM    1. 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  IGRT was done with pre-fraction MV-CT.  Narrative: The patient tolerated radiation treatment relatively well.   Denied hemoptysis. Denied SOB. Reported difficulty and discomfort associated with swallowing. Reported nausea persisted. No skin changes noted within treatment field  Plan: The patient has completed radiation treatment. The patient will return to radiation oncology clinic for routine followup in one month. I advised him to call or return sooner if he has any questions or concerns related to his recovery or treatment. ________________________________  [name]. [name], M.D.

## 2014-11-12 ENCOUNTER — Ambulatory Visit: Payer: Medicare Other

## 2014-11-13 ENCOUNTER — Ambulatory Visit: Payer: Medicare Other

## 2014-11-14 ENCOUNTER — Ambulatory Visit: Payer: Medicare Other

## 2014-11-24 NOTE — Addendum Note (Signed)
Encounter addended by: Tyler Pita, MD on: 11/24/2014  6:26 PM<BR>     Documentation filed: Notes Section

## 2014-11-24 NOTE — Progress Notes (Signed)
  Radiation Oncology         [phone] ________________________________  Name: Evan [name] MRN: [id number]  Date: 09/18/2014  DOB: 10/03/1950  SIMULATION AND TREATMENT PLANNING NOTE  DIAGNOSIS:   64 yo gentleman with Stage T2N2M0 sqamous cell carcinoma of the Left upper lung with a history of previous Right lung cancer treated with chemoradiotherapy in 2010  NARRATIVE:  The patient was brought to the [facility].  Identity was confirmed.  All relevant records and images related to the planned course of therapy were reviewed.  The patient freely provided informed written consent to proceed with treatment after reviewing the details related to the planned course of therapy. The consent form was witnessed and verified by the simulation staff.  Then, the patient was set-up in a stable reproducible  supine position for radiation therapy.  CT images were obtained.  Surface markings were placed.  The CT images were loaded into the planning software.  Then the target and avoidance structures were contoured.  Treatment planning then occurred.  The radiation prescription was entered and confirmed.  Then, I designed and supervised the construction of a total of 6 medically necessary complex treatment devices, including a BodyFix immobilization mold custom fitted to the patient along with 5 multileaf collimators conformally shaped radiation around the treatment target while shielding critical structures such as the heart and spinal cord maximally.  I have requested : 3D Simulation  I have requested a DVH of the following structures: Left lung, right lung, spinal cord, heart, esophagus, and target.  I have ordered:Nutrition Consult  SPECIAL TREATMENT PROCEDURE:  The planned course of therapy using radiation constitutes a special treatment procedure. Special care is required in the management of this patient for the following reasons.  The patient will be receiving concurrent chemotherapy  requiring careful monitoring for increased toxicities of treatment including periodic laboratory values.  The special nature of the planned course of radiotherapy will require increased physician supervision and oversight to ensure patient's safety with optimal treatment outcomes.  Also STP due to previous thoracic radiotherapy and care in delivering further dose to chest.  PLAN:  The patient will receive 66 Gy in 33 fractions.  ________________________________  [name] [name], M.D.

## 2014-12-12 ENCOUNTER — Ambulatory Visit
Admission: RE | Admit: 2014-12-12 | Discharge: 2014-12-12 | Disposition: A | Discharge status: Home / Self Care | Payer: Medicare Other | Source: Ambulatory Visit | Attending: Radiation Oncology | Admitting: Radiation Oncology

## 2014-12-12 ENCOUNTER — Encounter: Payer: Self-pay | Admitting: Radiation Oncology

## 2014-12-12 VITALS — BP 135/86 | HR 80 | Resp 16 | Wt 127.5 lb

## 2014-12-12 DIAGNOSIS — C3412 Malignant neoplasm of upper lobe, left bronchus or lung: Secondary | ICD-10-CM

## 2014-12-12 NOTE — Progress Notes (Signed)
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, stage III - 2016 162.3 C34.12     Interval Since Last Radiation:  1  months  Narrative:  The patient returns today for routine follow-up.  Weight and vitals stable. Drinks Associate Professor milk daily. Denies pain. Steady gait with assistance of cane. Patient reports using only his ventolin inhaler and denies taking any other medication at this time. Reports a productive cough with clear sputum. Denies hemoptysis. Reports he continues to use radiaplex for hyperpigmented skin within treatment field. Reports SOB with exertion. Reports reflux continues. Reports energy level is slowly improving. Scheduled for ct of chest tomorrow and follow up with Dr. Julien Nordmann on 11/14.                               ALLERGIES:  has No Known Allergies.  Meds: Current Outpatient Prescriptions  Medication Sig Dispense Refill  . albuterol (PROVENTIL HFA;VENTOLIN HFA) 108 (90 BASE) MCG/ACT inhaler Inhale 2 puffs into the lungs every 6 (six) hours as needed for wheezing or shortness of breath. 1 Inhaler 2  . acetaminophen (TYLENOL) 500 MG tablet Take 500 mg by mouth every 6 (six) hours as needed.    . diphenhydramine-acetaminophen (TYLENOL PM) 25-500 MG TABS Take 1 tablet by mouth at bedtime as needed.    [name] HYDROcodone-acetaminophen (NORCO/VICODIN) 5-325 MG per tablet Take 1 tablet by mouth every 6 (six) hours as needed for moderate pain. (Patient not taking: Reported on 10/22/2014) 30 tablet 0  . ibuprofen (ADVIL,MOTRIN) 200 MG tablet Take 200 mg by mouth every 6  (six) hours as needed.    [name] OVER THE COUNTER MEDICATION Take 480 mLs by mouth daily. Joint juice    . prochlorperazine (COMPAZINE) 10 MG tablet Take 1 tablet (10 mg total) by mouth every 6 (six) hours as needed for nausea or vomiting. (Patient not taking: Reported on 12/12/2014) 30 tablet 0   No current facility-administered medications for this encounter.    Physical Findings: The patient is in no acute distress. Patient is alert and oriented.  weight is 127 lb 8 oz (57.834 kg). His blood pressure is 135/86 and his pulse is 80. His respiration is 16 and oxygen saturation is 100%. .  No significant changes.  Lab Findings: Lab Results  Component Value Date   WBC 3.2* 11/04/2014   WBC 9.0 08/30/2014   HGB 12.3* 11/04/2014   HGB 11.7* 08/30/2014   HCT 37.4* 11/04/2014   HCT 35.1* 08/30/2014   PLT 289 11/04/2014   PLT 680* 08/30/2014    Lab Results  Component Value Date   NA 137 11/04/2014   NA 138 07/24/2013   NA 139 07/20/2011   K 3.6 11/04/2014   K 4.4 07/24/2013   K 4.8* 07/20/2011   CHLORIDE 107 11/04/2014   CO2 23 11/04/2014   CO2 23 07/24/2013   CO2 28 07/20/2011   GLUCOSE 107 11/04/2014   GLUCOSE 104* 07/24/2013   GLUCOSE 97 07/19/2012   GLUCOSE 111  07/20/2011   BUN 10.4 11/04/2014   BUN 11 07/24/2013   BUN 9 07/20/2011   CREATININE 0.7 11/04/2014   CREATININE 0.86 07/24/2013   CREATININE 1.3* 07/20/2011   BILITOT <0.30 11/04/2014   BILITOT 0.4 07/24/2013   BILITOT 0.60 07/20/2011   ALKPHOS 89 11/04/2014   ALKPHOS 113 07/24/2013   ALKPHOS 88* 07/20/2011   AST 14 11/04/2014   AST 14 07/24/2013   AST 21 07/20/2011   ALT 17 11/04/2014   ALT 14 07/24/2013   ALT 27 07/20/2011   PROT 7.1 11/04/2014   PROT 7.2 07/24/2013   PROT 7.7 07/20/2011   ALBUMIN 3.4* 11/04/2014   ALBUMIN 3.5 07/24/2013   ALBUMIN 3.5 07/20/2011   CALCIUM 9.0 11/04/2014   CALCIUM 9.4 07/24/2013   CALCIUM 8.9 07/20/2011   ANIONGAP 6 11/04/2014    Radiographic Findings: No  results found.  Impression:  The patient is recovering from the effects of radiation.   Plan:  Scheduled for CT of chest tomorrow and follow up with Dr. Julien Nordmann on 11/14. Will follow up in rad onc as needed. Will continue to follow up in med onc with Dr. Julien Nordmann.  _____________________________________  [name]. [name], M.D.   This document serves as a record of services personally performed by Tyler Pita, MD. It was created on his behalf by Arlyce Harman, a trained medical scribe. The creation of this record is based on the scribe's personal observations and the provider's statements to them. This document has been checked and approved by the attending provider.

## 2014-12-12 NOTE — Progress Notes (Addendum)
Weight and vitals stable. Drinks Associate Professor milk daily.  Denies pain. Steady gait with assistance of cane. Patient reports using only his ventolin inhaler and denies taking any other medication at this time. Reports a productive cough with clear sputum. Denies hemoptysis. Reports he continues to use radiaplex for hyperpigmented skin within treatment field. Reports SOB with exertion. Reports reflux continues. Reports energy level is slowly improving. Scheduled for ct of chest tomorrow and follow up with Dr. Julien Nordmann on 11/14.  BP 135/86 mmHg  Pulse 80  Resp 16  Wt 127 lb 8 oz (57.834 kg)  SpO2 100% Wt Readings from Last 3 Encounters:  12/12/14 127 lb 8 oz (57.834 kg)  11/08/14 125 lb 3.2 oz (56.79 kg)  11/04/14 123 lb 14.4 oz (56.201 kg)

## 2014-12-13 ENCOUNTER — Ambulatory Visit (HOSPITAL_COMMUNITY)
Admission: RE | Admit: 2014-12-13 | Discharge: 2014-12-13 | Disposition: A | Discharge status: Home / Self Care | Payer: Medicare Other | Source: Ambulatory Visit | Attending: Internal Medicine | Admitting: Internal Medicine

## 2014-12-13 ENCOUNTER — Encounter (HOSPITAL_COMMUNITY): Payer: Self-pay

## 2014-12-13 ENCOUNTER — Other Ambulatory Visit (HOSPITAL_BASED_OUTPATIENT_CLINIC_OR_DEPARTMENT_OTHER): Payer: Medicare Other

## 2014-12-13 DIAGNOSIS — Z789 Other specified health status: Secondary | ICD-10-CM | POA: Diagnosis present

## 2014-12-13 DIAGNOSIS — C3412 Malignant neoplasm of upper lobe, left bronchus or lung: Secondary | ICD-10-CM | POA: Diagnosis not present

## 2014-12-13 DIAGNOSIS — Z923 Personal history of irradiation: Secondary | ICD-10-CM | POA: Insufficient documentation

## 2014-12-13 LAB — CBC WITH DIFFERENTIAL/PLATELET
BASO%: 1.9 % (ref 0.0–2.0)
BASOS ABS: 0.1 10*3/uL (ref 0.0–0.1)
EOS ABS: 0.6 10*3/uL — AB (ref 0.0–0.5)
EOS%: 12.6 % — AB (ref 0.0–7.0)
HEMATOCRIT: 42.1 % (ref 38.4–49.9)
HEMOGLOBIN: 13.9 g/dL (ref 13.0–17.1)
LYMPH#: 0.8 10*3/uL — AB (ref 0.9–3.3)
LYMPH%: 17.2 % (ref 14.0–49.0)
MCH: 29.7 pg (ref 27.2–33.4)
MCHC: 33 g/dL (ref 32.0–36.0)
MCV: 90 fL (ref 79.3–98.0)
MONO#: 0.5 10*3/uL (ref 0.1–0.9)
MONO%: 11.1 % (ref 0.0–14.0)
NEUT#: 2.7 10*3/uL (ref 1.5–6.5)
NEUT%: 57.2 % (ref 39.0–75.0)
PLATELETS: 347 10*3/uL (ref 140–400)
RBC: 4.68 10*6/uL (ref 4.20–5.82)
RDW: 17.2 % — AB (ref 11.0–14.6)
WBC: 4.8 10*3/uL (ref 4.0–10.3)

## 2014-12-13 LAB — COMPREHENSIVE METABOLIC PANEL (CC13)
ALBUMIN: 3.6 g/dL (ref 3.5–5.0)
ALK PHOS: 93 U/L (ref 40–150)
ALT: 14 U/L (ref 0–55)
AST: 16 U/L (ref 5–34)
Anion Gap: 10 mEq/L (ref 3–11)
BILIRUBIN TOTAL: 0.35 mg/dL (ref 0.20–1.20)
BUN: 12.5 mg/dL (ref 7.0–26.0)
CALCIUM: 9.4 mg/dL (ref 8.4–10.4)
CO2: 22 mEq/L (ref 22–29)
Chloride: 106 mEq/L (ref 98–109)
Creatinine: 0.9 mg/dL (ref 0.7–1.3)
GLUCOSE: 105 mg/dL (ref 70–140)
Potassium: 3.9 mEq/L (ref 3.5–5.1)
SODIUM: 138 meq/L (ref 136–145)
TOTAL PROTEIN: 7 g/dL (ref 6.4–8.3)

## 2014-12-13 LAB — LACTATE DEHYDROGENASE (CC13): LDH: 145 U/L (ref 125–245)

## 2014-12-13 MED ORDER — IOHEXOL 300 MG/ML  SOLN
75.0000 mL | Freq: Once | INTRAMUSCULAR | Status: AC | PRN
  Administered 2014-12-13: 75 mL via INTRAVENOUS

## 2014-12-16 ENCOUNTER — Ambulatory Visit (HOSPITAL_BASED_OUTPATIENT_CLINIC_OR_DEPARTMENT_OTHER): Payer: Medicare Other

## 2014-12-16 ENCOUNTER — Encounter: Payer: Self-pay | Admitting: Internal Medicine

## 2014-12-16 ENCOUNTER — Telehealth: Payer: Self-pay | Admitting: *Deleted

## 2014-12-16 ENCOUNTER — Telehealth: Payer: Self-pay | Admitting: Internal Medicine

## 2014-12-16 ENCOUNTER — Ambulatory Visit (HOSPITAL_BASED_OUTPATIENT_CLINIC_OR_DEPARTMENT_OTHER): Payer: Medicare Other | Admitting: Internal Medicine

## 2014-12-16 ENCOUNTER — Other Ambulatory Visit: Payer: Self-pay | Admitting: Medical Oncology

## 2014-12-16 VITALS — BP 118/80 | HR 73 | Temp 97.8°F | Resp 17 | Ht 66.0 in | Wt 127.0 lb

## 2014-12-16 DIAGNOSIS — C3412 Malignant neoplasm of upper lobe, left bronchus or lung: Secondary | ICD-10-CM

## 2014-12-16 DIAGNOSIS — Z23 Encounter for immunization: Secondary | ICD-10-CM

## 2014-12-16 DIAGNOSIS — C3411 Malignant neoplasm of upper lobe, right bronchus or lung: Secondary | ICD-10-CM

## 2014-12-16 MED ORDER — INFLUENZA VAC SPLIT QUAD 0.5 ML IM SUSY
0.5000 mL | PREFILLED_SYRINGE | Freq: Once | INTRAMUSCULAR | Status: AC
  Administered 2014-12-16: 0.5 mL via INTRAMUSCULAR
  Filled 2014-12-16: qty 0.5

## 2014-12-16 NOTE — Telephone Encounter (Signed)
cld pt & spoke to pt and gave pt time and date of 12/1 appt

## 2014-12-16 NOTE — Telephone Encounter (Signed)
per pof tos ch pt appt-sent MW email to sch trmt-pt aware of 12/1 appt

## 2014-12-16 NOTE — Progress Notes (Signed)
[facility] [phone]   [phone]  OFFICE PROGRESS NOTE  No PCP Per Patient No address on file  PRINCIPAL DIAGNOSIS: 1) stage IIIA (T2a, N2, M0) non-small cell lung cancer, squamous cell carcinoma diagnosed in August 2016 2)  Stage IIIA (T2 N2 Mx) non-small cell lung cancer, adenocarcinoma, involving the right upper lobe and mediastinal lymphadenopathy diagnosed in June 2010.   PRIOR THERAPY:  1. Status post concurrent chemoradiation with weekly carboplatin and paclitaxel; last dose was given April 08, 2008. 2. Status post 3 cycles of consolidation chemotherapy with docetaxel; last dose was given on Jun 26, 2008. Concurrent chemoradiation with weekly carboplatin for AUC of 2 and paclitaxel 45 MG/M2. First dose expected on 09/23/2014 status post 7 cycles. Last dose was given 12/01/2014 with partial response.  CURRENT THERAPY: Consolidation chemotherapy with carboplatin for AUC of 5 and paclitaxel 175 MG/M2 every C weeks with Neulasta support. First dose 01/02/2015.  INTERVAL HISTORY: Evan [name] 64 y.o. male returns to the clinic today for followup visit. The patient completed a course of concurrent chemoradiation with weekly carboplatin and paclitaxel and tolerating his treatment fairly well. He is feeling much better today. He denied having any cough, shortness of breath or hemoptysis. The patient denied having any significant weight loss or night sweats. He has no nausea or vomiting. He had repeat CT scan of the chest performed recently and he is here for evaluation and discussion of his scan results.  MEDICAL HISTORY: Past Medical History  Diagnosis Date  . Complication of anesthesia     " sometimes I wake up during surgery "  . Anxiety   . Depression   . Lung cancer ([place])     non small cell lung cancer    ALLERGIES:  has No Known Allergies.  MEDICATIONS:  Current Outpatient Prescriptions  Medication Sig Dispense Refill  .  albuterol (PROVENTIL HFA;VENTOLIN HFA) 108 (90 BASE) MCG/ACT inhaler Inhale 2 puffs into the lungs every 6 (six) hours as needed for wheezing or shortness of breath. 1 Inhaler 2  . acetaminophen (TYLENOL) 500 MG tablet Take 500 mg by mouth every 6 (six) hours as needed.    . diphenhydramine-acetaminophen (TYLENOL PM) 25-500 MG TABS Take 1 tablet by mouth at bedtime as needed.    [name] HYDROcodone-acetaminophen (NORCO/VICODIN) 5-325 MG per tablet Take 1 tablet by mouth every 6 (six) hours as needed for moderate pain. (Patient not taking: Reported on 10/22/2014) 30 tablet 0  . ibuprofen (ADVIL,MOTRIN) 200 MG tablet Take 200 mg by mouth every 6 (six) hours as needed.    [name] OVER THE COUNTER MEDICATION Take 480 mLs by mouth daily. Joint juice    . prochlorperazine (COMPAZINE) 10 MG tablet Take 1 tablet (10 mg total) by mouth every 6 (six) hours as needed for nausea or vomiting. (Patient not taking: Reported on 12/12/2014) 30 tablet 0   Current Facility-Administered Medications  Medication Dose Route Frequency Provider Last Rate Last Dose  . Influenza vac split quadrivalent PF (FLUARIX) injection 0.5 mL  0.5 mL Intramuscular Once Curt Bears, MD        SURGICAL HISTORY:  Past Surgical History  Procedure Laterality Date  . Foot amputation Right     traumatic right lower extremity  . Amputation Right 05/21/2012    Procedure: AMPUTATION BELOW KNEE;  Surgeon: Newt Minion, MD;  Location: [facility];  Service: Orthopedics;  Laterality: Right;  . Upper jaw      SURGERY FOR  INFECTION  . Colonoscopy w/ biopsies and polypectomy    . Amputation Right 06/21/2012    Procedure: Revision AMPUTATION BELOW KNEE Right;  Surgeon: Newt Minion, MD;  Location: [facility];  Service: Orthopedics;  Laterality: Right;  Revision right Below Knee Amputation    REVIEW OF SYSTEMS:  Constitutional: negative Eyes: negative Ears, nose, mouth, throat, and face: negative Respiratory: negative Cardiovascular: negative Gastrointestinal:  negative Genitourinary:negative Integument/breast: negative Hematologic/lymphatic: negative Musculoskeletal:negative Neurological: negative Behavioral/Psych: negative Endocrine: negative Allergic/Immunologic: negative   PHYSICAL EXAMINATION: General appearance: alert, cooperative, fatigued and no distress Head: Normocephalic, without obvious abnormality, atraumatic Neck: no adenopathy Lymph nodes: Cervical, supraclavicular, and axillary nodes normal. Resp: clear to auscultation bilaterally Back: symmetric, no curvature. ROM normal. No CVA tenderness. Cardio: regular rate and rhythm, S1, S2 normal, no murmur, click, rub or gallop GI: soft, non-tender; bowel sounds normal; no masses,  no organomegaly Extremities: Right below knee amputation Neurologic: Alert and oriented X 3, normal strength and tone. Normal symmetric reflexes. Normal coordination and gait  ECOG PERFORMANCE STATUS: 1 - Symptomatic but completely ambulatory  Blood pressure 118/80, pulse 73, temperature 97.8 F (36.6 C), temperature source Oral, resp. rate 17, height '5\' 6"'$  (1.676 m), weight 127 lb (57.607 kg), SpO2 100 %.  LABORATORY DATA: Lab Results  Component Value Date   WBC 4.8 12/13/2014   HGB 13.9 12/13/2014   HCT 42.1 12/13/2014   MCV 90.0 12/13/2014   PLT 347 12/13/2014      Chemistry      Component Value Date/Time   NA 138 12/13/2014 1037   NA 138 07/24/2013 1454   NA 139 07/20/2011 1312   K 3.9 12/13/2014 1037   K 4.4 07/24/2013 1454   K 4.8* 07/20/2011 1312   CL 101 07/24/2013 1454   CL 104 07/19/2012 1432   CL 102 07/20/2011 1312   CO2 22 12/13/2014 1037   CO2 23 07/24/2013 1454   CO2 28 07/20/2011 1312   BUN 12.5 12/13/2014 1037   BUN 11 07/24/2013 1454   BUN 9 07/20/2011 1312   CREATININE 0.9 12/13/2014 1037   CREATININE 0.86 07/24/2013 1454   CREATININE 1.3* 07/20/2011 1312      Component Value Date/Time   CALCIUM 9.4 12/13/2014 1037   CALCIUM 9.4 07/24/2013 1454   CALCIUM 8.9  07/20/2011 1312   ALKPHOS 93 12/13/2014 1037   ALKPHOS 113 07/24/2013 1454   ALKPHOS 88* 07/20/2011 1312   AST 16 12/13/2014 1037   AST 14 07/24/2013 1454   AST 21 07/20/2011 1312   ALT 14 12/13/2014 1037   ALT 14 07/24/2013 1454   ALT 27 07/20/2011 1312   BILITOT 0.35 12/13/2014 1037   BILITOT 0.4 07/24/2013 1454   BILITOT 0.60 07/20/2011 1312       RADIOGRAPHIC STUDIES: Ct Chest W Contrast  12/13/2014  CLINICAL DATA:  Restaging recurrent left upper lobe lung cancer, diagnosed 2010/2012/2016, XRT/chemotherapy complete EXAM: CT CHEST WITH CONTRAST TECHNIQUE: Multidetector CT imaging of the chest was performed during intravenous contrast administration. CONTRAST:  72m OMNIPAQUE IOHEXOL 300 MG/ML  SOLN COMPARISON:  PET-CT dated 08/16/2014 FINDINGS: Mediastinum/Nodes: The heart is normal in size. No pericardial effusion. Atherosclerotic calcifications of the aortic arch. 13 mm short axis AP window node (series 2/image 20), previously 15 mm. No suspicious hilar or axillary lymphadenopathy. Visualized thyroid is unremarkable. Lungs/Pleura: 3.6 x 2.0 cm mass in the medial left upper lobe adjacent to the mediastinum (series 5/image 19), previously 3.9 x 3.9 cm, corresponding to known squamous cell  lung cancer. Radiation changes in the right paramediastinal region/medial right lower lobe. Underlying mild centrilobular and paraseptal emphysematous changes. Mildly eventration of the left hemidiaphragm. No focal consolidation.  No pleural effusion or pneumothorax. Upper abdomen: Visualized upper abdomen is within normal limits. Musculoskeletal: Mild degenerative changes of the lower thoracic spine. IMPRESSION: 3.6 x 2.0 cm mass in the medial left upper lobe, decreased, corresponding to known squamous cell lung cancer. 13 mm short axis AP window node, stable versus mildly decreased. Radiation changes in the right paramediastinal region/medial right lower lobe. Electronically Signed   By: Julian Hy  M.D.   On: 12/13/2014 14:54    ASSESSMENT AND PLAN: This is a very pleasant 64 years old [place] American male with history of stage IIIa non-small cell lung cancer status post concurrent chemoradiation followed by 3 cycles of consolidation chemotherapy and has been observation since May of 2010 with no evidence for disease progression. The patient completed a course of concurrent chemoradiation for recurrent non-small cell lung cancer, squamous cell carcinoma involving the left upper lobe and mediastinal lymphadenopathy. He is status post 7 cycles with partial response. The recent CT scan of the chest showed improvement in the left upper lobe lung mass in addition to the mediastinal lymphadenopathy. I had a lengthy discussion with the patient today about his scan results, current condition and treatment options. I gave the patient the option of close observation and monitoring versus proceeding with 3 cycles of consolidation chemotherapy with carboplatin for AUC of 5 and paclitaxel 175 MG/M2 every C weeks with Neulasta support. The patient is interested in proceeding with the consolidation chemotherapy. I discussed with him the adverse effect of this treatment including but not limited to alopecia, myelosuppression, nausea and vomiting, peripheral neuropathy, liver or renal dysfunction. He is expected to start the first cycle of this treatment on 01/02/2015. The patient would come back for follow-up visit in one month with the start of cycle #2. CODE STATUS was discussed with the patient and he requested a full CODE STATUS. The patient was advised to call immediately if he has any concerning symptoms in the interval. He was advised to call immediately if she has any concerning symptoms in the interval.  All questions were answered. The patient knows to call the clinic with any problems, questions or concerns. We can certainly see the patient much sooner if necessary.  Disclaimer: This note was  dictated with voice recognition software. Similar sounding words can inadvertently be transcribed and may not be corrected upon review.

## 2014-12-16 NOTE — Telephone Encounter (Signed)
Per staff message and POF I have scheduled appts. Advised scheduler of appts. JMW  

## 2015-01-02 ENCOUNTER — Other Ambulatory Visit: Payer: Medicare Other

## 2015-01-02 ENCOUNTER — Ambulatory Visit (HOSPITAL_BASED_OUTPATIENT_CLINIC_OR_DEPARTMENT_OTHER): Payer: Medicare Other

## 2015-01-02 ENCOUNTER — Other Ambulatory Visit (HOSPITAL_BASED_OUTPATIENT_CLINIC_OR_DEPARTMENT_OTHER): Payer: Medicare Other

## 2015-01-02 VITALS — BP 143/90 | HR 78 | Temp 98.1°F | Resp 18

## 2015-01-02 DIAGNOSIS — Z5111 Encounter for antineoplastic chemotherapy: Secondary | ICD-10-CM | POA: Diagnosis not present

## 2015-01-02 DIAGNOSIS — C3412 Malignant neoplasm of upper lobe, left bronchus or lung: Secondary | ICD-10-CM

## 2015-01-02 DIAGNOSIS — R110 Nausea: Secondary | ICD-10-CM

## 2015-01-02 LAB — COMPREHENSIVE METABOLIC PANEL (CC13)
ALBUMIN: 3.7 g/dL (ref 3.5–5.0)
ALK PHOS: 109 U/L (ref 40–150)
ALT: 13 U/L (ref 0–55)
AST: 17 U/L (ref 5–34)
Anion Gap: 7 mEq/L (ref 3–11)
BILIRUBIN TOTAL: 0.31 mg/dL (ref 0.20–1.20)
BUN: 8.1 mg/dL (ref 7.0–26.0)
CO2: 24 mEq/L (ref 22–29)
Calcium: 9.4 mg/dL (ref 8.4–10.4)
Chloride: 106 mEq/L (ref 98–109)
Creatinine: 0.9 mg/dL (ref 0.7–1.3)
GLUCOSE: 91 mg/dL (ref 70–140)
Potassium: 4.1 mEq/L (ref 3.5–5.1)
SODIUM: 137 meq/L (ref 136–145)
TOTAL PROTEIN: 7.5 g/dL (ref 6.4–8.3)

## 2015-01-02 LAB — CBC WITH DIFFERENTIAL/PLATELET
BASO%: 0.9 % (ref 0.0–2.0)
Basophils Absolute: 0 10*3/uL (ref 0.0–0.1)
EOS ABS: 0.4 10*3/uL (ref 0.0–0.5)
EOS%: 8.6 % — ABNORMAL HIGH (ref 0.0–7.0)
HEMATOCRIT: 45.2 % (ref 38.4–49.9)
HEMOGLOBIN: 14.5 g/dL (ref 13.0–17.1)
LYMPH#: 0.8 10*3/uL — AB (ref 0.9–3.3)
LYMPH%: 16.5 % (ref 14.0–49.0)
MCH: 29.3 pg (ref 27.2–33.4)
MCHC: 32 g/dL (ref 32.0–36.0)
MCV: 91.6 fL (ref 79.3–98.0)
MONO#: 0.5 10*3/uL (ref 0.1–0.9)
MONO%: 9.5 % (ref 0.0–14.0)
NEUT%: 64.5 % (ref 39.0–75.0)
NEUTROS ABS: 3.1 10*3/uL (ref 1.5–6.5)
PLATELETS: 348 10*3/uL (ref 140–400)
RBC: 4.94 10*6/uL (ref 4.20–5.82)
RDW: 16.1 % — AB (ref 11.0–14.6)
WBC: 4.9 10*3/uL (ref 4.0–10.3)

## 2015-01-02 MED ORDER — FAMOTIDINE IN NACL 20-0.9 MG/50ML-% IV SOLN
20.0000 mg | Freq: Once | INTRAVENOUS | Status: AC
  Administered 2015-01-02: 20 mg via INTRAVENOUS

## 2015-01-02 MED ORDER — SODIUM CHLORIDE 0.9 % IV SOLN
Freq: Once | INTRAVENOUS | Status: AC
  Administered 2015-01-02: 13:00:00 via INTRAVENOUS
  Filled 2015-01-02: qty 8

## 2015-01-02 MED ORDER — DIPHENHYDRAMINE HCL 50 MG/ML IJ SOLN
50.0000 mg | Freq: Once | INTRAMUSCULAR | Status: AC
  Administered 2015-01-02: 50 mg via INTRAVENOUS

## 2015-01-02 MED ORDER — PROCHLORPERAZINE MALEATE 10 MG PO TABS: 10.0000 mg | ORAL_TABLET | Freq: Four times a day (QID) | ORAL | Status: DC | PRN

## 2015-01-02 MED ORDER — DEXTROSE 5 % IV SOLN
175.0000 mg/m2 | Freq: Once | INTRAVENOUS | Status: AC
  Administered 2015-01-02: 288 mg via INTRAVENOUS
  Filled 2015-01-02: qty 48

## 2015-01-02 MED ORDER — SODIUM CHLORIDE 0.9 % IV SOLN
Freq: Once | INTRAVENOUS | Status: AC
  Administered 2015-01-02: 13:00:00 via INTRAVENOUS

## 2015-01-02 MED ORDER — DIPHENHYDRAMINE HCL 50 MG/ML IJ SOLN
INTRAMUSCULAR | Status: AC
  Filled 2015-01-02: qty 1

## 2015-01-02 MED ORDER — FAMOTIDINE IN NACL 20-0.9 MG/50ML-% IV SOLN
INTRAVENOUS | Status: AC
  Filled 2015-01-02: qty 50

## 2015-01-02 MED ORDER — SODIUM CHLORIDE 0.9 % IV SOLN
467.0000 mg | Freq: Once | INTRAVENOUS | Status: AC
  Administered 2015-01-02: 470 mg via INTRAVENOUS
  Filled 2015-01-02: qty 47

## 2015-01-02 NOTE — Patient Instructions (Signed)
[facility] Discharge Instructions for Patients Receiving Chemotherapy  Today you received the following chemotherapy agents taxol, carboplatin  To help prevent nausea and vomiting after your treatment, we encourage you to take your nausea medication   If you develop nausea and vomiting that is not controlled by your nausea medication, call the clinic.   BELOW ARE SYMPTOMS THAT SHOULD BE REPORTED IMMEDIATELY:  *FEVER GREATER THAN 100.5 F  *CHILLS WITH OR WITHOUT FEVER  NAUSEA AND VOMITING THAT IS NOT CONTROLLED WITH YOUR NAUSEA MEDICATION  *UNUSUAL SHORTNESS OF BREATH  *UNUSUAL BRUISING OR BLEEDING  TENDERNESS IN MOUTH AND THROAT WITH OR WITHOUT PRESENCE OF ULCERS  *URINARY PROBLEMS  *BOWEL PROBLEMS  UNUSUAL RASH Items with * indicate a potential emergency and should be followed up as soon as possible.  Feel free to call the clinic you have any questions or concerns. The clinic phone number is [phone].  Please show the [facility] at check-in to the Emergency Department and triage nurse.

## 2015-01-04 ENCOUNTER — Ambulatory Visit (HOSPITAL_BASED_OUTPATIENT_CLINIC_OR_DEPARTMENT_OTHER): Payer: Medicare Other

## 2015-01-04 VITALS — BP 156/85 | HR 107 | Temp 97.9°F

## 2015-01-04 DIAGNOSIS — Z5189 Encounter for other specified aftercare: Secondary | ICD-10-CM | POA: Diagnosis not present

## 2015-01-04 DIAGNOSIS — C3412 Malignant neoplasm of upper lobe, left bronchus or lung: Secondary | ICD-10-CM | POA: Diagnosis present

## 2015-01-04 MED ORDER — PEGFILGRASTIM INJECTION 6 MG/0.6ML ~~LOC~~
6.0000 mg | PREFILLED_SYRINGE | Freq: Once | SUBCUTANEOUS | Status: AC
  Administered 2015-01-04: 6 mg via SUBCUTANEOUS

## 2015-01-23 ENCOUNTER — Ambulatory Visit: Payer: Medicare Other | Admitting: Internal Medicine

## 2015-01-23 ENCOUNTER — Ambulatory Visit: Payer: Medicare Other

## 2015-01-23 ENCOUNTER — Other Ambulatory Visit: Payer: Medicare Other

## 2015-01-23 ENCOUNTER — Telehealth: Payer: Self-pay | Admitting: Internal Medicine

## 2015-01-23 ENCOUNTER — Other Ambulatory Visit (HOSPITAL_BASED_OUTPATIENT_CLINIC_OR_DEPARTMENT_OTHER): Payer: Medicare Other

## 2015-01-23 DIAGNOSIS — C3412 Malignant neoplasm of upper lobe, left bronchus or lung: Secondary | ICD-10-CM

## 2015-01-23 LAB — CBC WITH DIFFERENTIAL/PLATELET
BASO%: 0.5 % (ref 0.0–2.0)
Basophils Absolute: 0 10*3/uL (ref 0.0–0.1)
EOS%: 2.3 % (ref 0.0–7.0)
Eosinophils Absolute: 0.2 10*3/uL (ref 0.0–0.5)
HCT: 43.7 % (ref 38.4–49.9)
HGB: 14.2 g/dL (ref 13.0–17.1)
LYMPH%: 5.5 % — AB (ref 14.0–49.0)
MCH: 29.5 pg (ref 27.2–33.4)
MCHC: 32.4 g/dL (ref 32.0–36.0)
MCV: 91.1 fL (ref 79.3–98.0)
MONO#: 0.6 10*3/uL (ref 0.1–0.9)
MONO%: 6.9 % (ref 0.0–14.0)
NEUT%: 84.8 % — ABNORMAL HIGH (ref 39.0–75.0)
NEUTROS ABS: 7.2 10*3/uL — AB (ref 1.5–6.5)
Platelets: 342 10*3/uL (ref 140–400)
RBC: 4.8 10*6/uL (ref 4.20–5.82)
RDW: 15.2 % — AB (ref 11.0–14.6)
WBC: 8.5 10*3/uL (ref 4.0–10.3)
lymph#: 0.5 10*3/uL — ABNORMAL LOW (ref 0.9–3.3)

## 2015-01-23 LAB — COMPREHENSIVE METABOLIC PANEL
ALK PHOS: 151 U/L — AB (ref 40–150)
ALT: 12 U/L (ref 0–55)
AST: 14 U/L (ref 5–34)
Albumin: 3.5 g/dL (ref 3.5–5.0)
Anion Gap: 10 mEq/L (ref 3–11)
BUN: 9.4 mg/dL (ref 7.0–26.0)
CO2: 22 mEq/L (ref 22–29)
Calcium: 9.2 mg/dL (ref 8.4–10.4)
Chloride: 106 mEq/L (ref 98–109)
Creatinine: 0.9 mg/dL (ref 0.7–1.3)
GLUCOSE: 127 mg/dL (ref 70–140)
Potassium: 3.8 mEq/L (ref 3.5–5.1)
SODIUM: 138 meq/L (ref 136–145)
TOTAL PROTEIN: 7.2 g/dL (ref 6.4–8.3)
Total Bilirubin: 0.36 mg/dL (ref 0.20–1.20)

## 2015-01-23 NOTE — Telephone Encounter (Signed)
Pt came in today for appts but they were moved to 12/23. Pt is ok with coming back tomorrow but asks to do the labs today. Pt added to walkin lab and advised to come in tomorrow at 8.45 for md appt and treatment.

## 2015-01-24 ENCOUNTER — Other Ambulatory Visit: Payer: Medicare Other

## 2015-01-24 ENCOUNTER — Encounter: Payer: Self-pay | Admitting: Internal Medicine

## 2015-01-24 ENCOUNTER — Telehealth: Payer: Self-pay | Admitting: *Deleted

## 2015-01-24 ENCOUNTER — Telehealth: Payer: Self-pay | Admitting: Internal Medicine

## 2015-01-24 ENCOUNTER — Ambulatory Visit (HOSPITAL_BASED_OUTPATIENT_CLINIC_OR_DEPARTMENT_OTHER): Payer: Medicare Other | Admitting: Internal Medicine

## 2015-01-24 ENCOUNTER — Ambulatory Visit (HOSPITAL_BASED_OUTPATIENT_CLINIC_OR_DEPARTMENT_OTHER): Payer: Medicare Other

## 2015-01-24 VITALS — BP 123/88 | HR 100 | Temp 97.0°F | Resp 18 | Ht 66.0 in | Wt 126.6 lb

## 2015-01-24 DIAGNOSIS — C3412 Malignant neoplasm of upper lobe, left bronchus or lung: Secondary | ICD-10-CM | POA: Diagnosis not present

## 2015-01-24 DIAGNOSIS — Z5112 Encounter for antineoplastic immunotherapy: Secondary | ICD-10-CM

## 2015-01-24 DIAGNOSIS — Z5111 Encounter for antineoplastic chemotherapy: Secondary | ICD-10-CM

## 2015-01-24 MED ORDER — SODIUM CHLORIDE 0.9 % IV SOLN
Freq: Once | INTRAVENOUS | Status: AC
  Administered 2015-01-24: 10:00:00 via INTRAVENOUS

## 2015-01-24 MED ORDER — PACLITAXEL CHEMO INJECTION 300 MG/50ML
175.0000 mg/m2 | Freq: Once | INTRAVENOUS | Status: AC
  Administered 2015-01-24: 288 mg via INTRAVENOUS
  Filled 2015-01-24: qty 48

## 2015-01-24 MED ORDER — SODIUM CHLORIDE 0.9 % IV SOLN
467.0000 mg | Freq: Once | INTRAVENOUS | Status: AC
  Administered 2015-01-24: 470 mg via INTRAVENOUS
  Filled 2015-01-24: qty 47

## 2015-01-24 MED ORDER — FAMOTIDINE IN NACL 20-0.9 MG/50ML-% IV SOLN
INTRAVENOUS | Status: AC
  Filled 2015-01-24: qty 50

## 2015-01-24 MED ORDER — FAMOTIDINE IN NACL 20-0.9 MG/50ML-% IV SOLN
20.0000 mg | Freq: Once | INTRAVENOUS | Status: AC
  Administered 2015-01-24: 20 mg via INTRAVENOUS

## 2015-01-24 MED ORDER — CARBOPLATIN CHEMO INTRADERMAL TEST DOSE 100MCG/0.02ML
100.0000 ug | Freq: Once | INTRADERMAL | Status: AC
  Administered 2015-01-24: 100 ug via INTRADERMAL
  Filled 2015-01-24: qty 0.01

## 2015-01-24 MED ORDER — DIPHENHYDRAMINE HCL 50 MG/ML IJ SOLN
50.0000 mg | Freq: Once | INTRAMUSCULAR | Status: AC
  Administered 2015-01-24: 50 mg via INTRAVENOUS

## 2015-01-24 MED ORDER — SODIUM CHLORIDE 0.9 % IV SOLN
Freq: Once | INTRAVENOUS | Status: AC
  Administered 2015-01-24: 11:00:00 via INTRAVENOUS
  Filled 2015-01-24: qty 8

## 2015-01-24 MED ORDER — DIPHENHYDRAMINE HCL 50 MG/ML IJ SOLN
INTRAMUSCULAR | Status: AC
  Filled 2015-01-24: qty 1

## 2015-01-24 NOTE — Telephone Encounter (Signed)
per pof to sch pt appt-cld MW to sch trmt-pt will get pdated copy b4 leaving

## 2015-01-24 NOTE — Patient Instructions (Signed)
[facility] Discharge Instructions for Patients Receiving Chemotherapy  Today you received the following chemotherapy agents Taxol/Carboplatin.  To help prevent nausea and vomiting after your treatment, we encourage you to take your nausea medication as directed.   If you develop nausea and vomiting that is not controlled by your nausea medication, call the clinic.   BELOW ARE SYMPTOMS THAT SHOULD BE REPORTED IMMEDIATELY:  *FEVER GREATER THAN 100.5 F  *CHILLS WITH OR WITHOUT FEVER  NAUSEA AND VOMITING THAT IS NOT CONTROLLED WITH YOUR NAUSEA MEDICATION  *UNUSUAL SHORTNESS OF BREATH  *UNUSUAL BRUISING OR BLEEDING  TENDERNESS IN MOUTH AND THROAT WITH OR WITHOUT PRESENCE OF ULCERS  *URINARY PROBLEMS  *BOWEL PROBLEMS  UNUSUAL RASH Items with * indicate a potential emergency and should be followed up as soon as possible.  Feel free to call the clinic you have any questions or concerns. The clinic phone number is [phone].  Please show the [facility] at check-in to the Emergency Department and triage nurse.

## 2015-01-24 NOTE — Telephone Encounter (Signed)
per pof to sch pt appt-gave pt copy of avs-MW sch appt

## 2015-01-24 NOTE — Telephone Encounter (Signed)
Per staff phone call and POF I have schedueld appts. Scheduler advised of appts.  JMW  

## 2015-01-24 NOTE — Progress Notes (Signed)
[facility] [phone]   [phone]  OFFICE PROGRESS NOTE  No PCP Per Patient No address on file  PRINCIPAL DIAGNOSIS: 1) stage IIIA (T2a, N2, M0) non-small cell lung cancer, squamous cell carcinoma diagnosed in August 2016 2)  Stage IIIA (T2 N2 Mx) non-small cell lung cancer, adenocarcinoma, involving the right upper lobe and mediastinal lymphadenopathy diagnosed in June 2010.   PRIOR THERAPY:  1. Status post concurrent chemoradiation with weekly carboplatin and paclitaxel; last dose was given April 08, 2008. 2. Status post 3 cycles of consolidation chemotherapy with docetaxel; last dose was given on Jun 26, 2008. Concurrent chemoradiation with weekly carboplatin for AUC of 2 and paclitaxel 45 MG/M2. First dose expected on 09/23/2014 status post 7 cycles. Last dose was given 12/01/2014 with partial response.  CURRENT THERAPY: Consolidation chemotherapy with carboplatin for AUC of 5 and paclitaxel 175 MG/M2 every 3 weeks with Neulasta support. First dose 01/02/2015. Status post one cycle.  INTERVAL HISTORY: Evan [name] 64 y.o. male returns to the clinic today for followup visit. The patient is currently on consolidation chemotherapy with carboplatin and paclitaxel status post 1 cycle and tolerating his treatment fairly well. He is feeling much better today. He denied having any cough, shortness of breath or hemoptysis. The patient denied having any significant weight loss or night sweats. He has no nausea or vomiting. He denied having any significant peripheral neuropathy. The patient is here today to start cycle #2 of his treatment.  MEDICAL HISTORY: Past Medical History  Diagnosis Date  . Complication of anesthesia     " sometimes I wake up during surgery "  . Anxiety   . Depression   . Lung cancer ([place])     non small cell lung cancer    ALLERGIES:  has No Known Allergies.  MEDICATIONS:  Current Outpatient Prescriptions  Medication  Sig Dispense Refill  . acetaminophen (TYLENOL) 500 MG tablet Take 500 mg by mouth every 6 (six) hours as needed.    [name] albuterol (PROVENTIL HFA;VENTOLIN HFA) 108 (90 BASE) MCG/ACT inhaler Inhale 2 puffs into the lungs every 6 (six) hours as needed for wheezing or shortness of breath. 1 Inhaler 2  . diphenhydramine-acetaminophen (TYLENOL PM) 25-500 MG TABS Take 1 tablet by mouth at bedtime as needed.    [name] HYDROcodone-acetaminophen (NORCO/VICODIN) 5-325 MG per tablet Take 1 tablet by mouth every 6 (six) hours as needed for moderate pain. (Patient not taking: Reported on 10/22/2014) 30 tablet 0  . ibuprofen (ADVIL,MOTRIN) 200 MG tablet Take 200 mg by mouth every 6 (six) hours as needed.    [name] OVER THE COUNTER MEDICATION Take 480 mLs by mouth daily. Joint juice    . prochlorperazine (COMPAZINE) 10 MG tablet Take 1 tablet (10 mg total) by mouth every 6 (six) hours as needed for nausea or vomiting. 30 tablet 0   No current facility-administered medications for this visit.    SURGICAL HISTORY:  Past Surgical History  Procedure Laterality Date  . Foot amputation Right     traumatic right lower extremity  . Amputation Right 05/21/2012    Procedure: AMPUTATION BELOW KNEE;  Surgeon: Newt Minion, MD;  Location: [facility];  Service: Orthopedics;  Laterality: Right;  . Upper jaw      SURGERY FOR INFECTION  . Colonoscopy w/ biopsies and polypectomy    . Amputation Right 06/21/2012    Procedure: Revision AMPUTATION BELOW KNEE Right;  Surgeon: Newt Minion, MD;  Location:  [facility];  Service: Orthopedics;  Laterality: Right;  Revision right Below Knee Amputation    REVIEW OF SYSTEMS:  A comprehensive review of systems was negative except for: Constitutional: positive for fatigue   PHYSICAL EXAMINATION: General appearance: alert, cooperative, fatigued and no distress Head: Normocephalic, without obvious abnormality, atraumatic Neck: no adenopathy Lymph nodes: Cervical, supraclavicular, and axillary nodes  normal. Resp: clear to auscultation bilaterally Back: symmetric, no curvature. ROM normal. No CVA tenderness. Cardio: regular rate and rhythm, S1, S2 normal, no murmur, click, rub or gallop GI: soft, non-tender; bowel sounds normal; no masses,  no organomegaly Extremities: Right below knee amputation Neurologic: Alert and oriented X 3, normal strength and tone. Normal symmetric reflexes. Normal coordination and gait  ECOG PERFORMANCE STATUS: 1 - Symptomatic but completely ambulatory  Blood pressure 123/88, pulse 100, temperature 97 F (36.1 C), temperature source Oral, resp. rate 18, height '5\' 6"'$  (1.676 m), weight 126 lb 9.6 oz (57.425 kg), SpO2 100 %.  LABORATORY DATA: Lab Results  Component Value Date   WBC 8.5 01/23/2015   HGB 14.2 01/23/2015   HCT 43.7 01/23/2015   MCV 91.1 01/23/2015   PLT 342 01/23/2015      Chemistry      Component Value Date/Time   NA 138 01/23/2015 1143   NA 138 07/24/2013 1454   NA 139 07/20/2011 1312   K 3.8 01/23/2015 1143   K 4.4 07/24/2013 1454   K 4.8* 07/20/2011 1312   CL 101 07/24/2013 1454   CL 104 07/19/2012 1432   CL 102 07/20/2011 1312   CO2 22 01/23/2015 1143   CO2 23 07/24/2013 1454   CO2 28 07/20/2011 1312   BUN 9.4 01/23/2015 1143   BUN 11 07/24/2013 1454   BUN 9 07/20/2011 1312   CREATININE 0.9 01/23/2015 1143   CREATININE 0.86 07/24/2013 1454   CREATININE 1.3* 07/20/2011 1312      Component Value Date/Time   CALCIUM 9.2 01/23/2015 1143   CALCIUM 9.4 07/24/2013 1454   CALCIUM 8.9 07/20/2011 1312   ALKPHOS 151* 01/23/2015 1143   ALKPHOS 113 07/24/2013 1454   ALKPHOS 88* 07/20/2011 1312   AST 14 01/23/2015 1143   AST 14 07/24/2013 1454   AST 21 07/20/2011 1312   ALT 12 01/23/2015 1143   ALT 14 07/24/2013 1454   ALT 27 07/20/2011 1312   BILITOT 0.36 01/23/2015 1143   BILITOT 0.4 07/24/2013 1454   BILITOT 0.60 07/20/2011 1312       RADIOGRAPHIC STUDIES: No results found.  ASSESSMENT AND PLAN: This is a very  pleasant 64 years old African American male with history of stage IIIa non-small cell lung cancer status post concurrent chemoradiation followed by 3 cycles of consolidation chemotherapy and has been observation since May of 2010 with no evidence for disease progression. The patient completed a course of concurrent chemoradiation for recurrent non-small cell lung cancer, squamous cell carcinoma involving the left upper lobe and mediastinal lymphadenopathy. He is status post 7 cycles with partial response. The patient is currently undergoing consolidation chemotherapy with carboplatin and paclitaxel status post 1 cycle. I recommended for him to proceed with cycle #2 today as scheduled. He would come back for follow-up visit in 3 weeks for reevaluation before starting cycle #3. CODE STATUS was discussed with the patient and he requested a full CODE STATUS. The patient was advised to call immediately if he has any concerning symptoms in the interval. He was advised to call immediately if she has any concerning  symptoms in the interval.  All questions were answered. The patient knows to call the clinic with any problems, questions or concerns. We can certainly see the patient much sooner if necessary.  Disclaimer: This note was dictated with voice recognition software. Similar sounding words can inadvertently be transcribed and may not be corrected upon review.

## 2015-01-25 ENCOUNTER — Ambulatory Visit: Payer: Medicare Other

## 2015-01-28 ENCOUNTER — Ambulatory Visit (HOSPITAL_BASED_OUTPATIENT_CLINIC_OR_DEPARTMENT_OTHER): Payer: Medicare Other | Admitting: Nurse Practitioner

## 2015-01-28 ENCOUNTER — Telehealth: Payer: Self-pay | Admitting: *Deleted

## 2015-01-28 ENCOUNTER — Ambulatory Visit (HOSPITAL_BASED_OUTPATIENT_CLINIC_OR_DEPARTMENT_OTHER): Payer: Medicare Other

## 2015-01-28 VITALS — BP 121/89 | HR 102 | Temp 98.2°F

## 2015-01-28 DIAGNOSIS — C3412 Malignant neoplasm of upper lobe, left bronchus or lung: Secondary | ICD-10-CM

## 2015-01-28 DIAGNOSIS — I809 Phlebitis and thrombophlebitis of unspecified site: Secondary | ICD-10-CM

## 2015-01-28 DIAGNOSIS — Z5189 Encounter for other specified aftercare: Secondary | ICD-10-CM | POA: Diagnosis not present

## 2015-01-28 MED ORDER — PEGFILGRASTIM INJECTION 6 MG/0.6ML ~~LOC~~
6.0000 mg | PREFILLED_SYRINGE | Freq: Once | SUBCUTANEOUS | Status: AC
  Administered 2015-01-28: 6 mg via SUBCUTANEOUS
  Filled 2015-01-28: qty 0.6

## 2015-01-28 NOTE — Telephone Encounter (Signed)
Called patient about scheduled appointment at 10:00 this morning.  Stated that he had called this morning to reschedule. Rescheduled for this afternoon at 2:00 pm

## 2015-01-28 NOTE — Progress Notes (Signed)
Hanz here for Neulasta injection.  Complained of irritation in left forearm where he had chemotherapy on 01/03/15.  Area is tender and slightly raised, vein is dark and corded.   Selena Lesser NP looked at he arm and told him that it looked like a phlebitis..  Instructed to watch it and let us know if it gets worse, not to use this arm for IV's or blood draw or BP if at all possible.  Also told that it could take quite a while for it to get better.  Patient seems to understand and able to repeat back instructions.

## 2015-01-29 ENCOUNTER — Telehealth: Payer: Self-pay | Admitting: Internal Medicine

## 2015-01-29 NOTE — Telephone Encounter (Signed)
S/w pt, advised md ordered wkly labs. Gave pt appt for 12/30 + 02/07/15.

## 2015-01-31 ENCOUNTER — Other Ambulatory Visit: Payer: Medicare Other

## 2015-02-04 ENCOUNTER — Encounter: Payer: Self-pay | Admitting: Nurse Practitioner

## 2015-02-04 ENCOUNTER — Telehealth: Payer: Self-pay | Admitting: *Deleted

## 2015-02-04 DIAGNOSIS — I809 Phlebitis and thrombophlebitis of unspecified site: Secondary | ICD-10-CM | POA: Insufficient documentation

## 2015-02-04 NOTE — Telephone Encounter (Signed)
Lm to rtn call to check status following [facility] visit.

## 2015-02-04 NOTE — Assessment & Plan Note (Signed)
Patient received peripheral chemotherapy to his left forearm on 01/03/2015.  Patient return to the [facility] today to receive his Neulasta injection.  He notes some increased mild erythema and edema to his left forearm for the past few days.  He denies any specific discomfort to the arm.  He also denies any recent fevers or chills.  Exam revealed healed IV insertion site to the left forearm; with some mild surrounding edema and trace erythema only.  There were no red streaks noted.  Also, there were some corded veins as well.  Most likely, patient is experiencing some mild phlebitis.  Blood counts obtained recently were within normal limits.  Patient was advised to try taking some low-dose ibuprofen to see if this helps with inflammation.  Also, patient was advised to call/return for difficulty to the emergency department for any worsening symptoms whatsoever.

## 2015-02-04 NOTE — Progress Notes (Signed)
[facility]TOM MANAGEMENT CLINIC   HPI: Evan [name] 65 y.o. male diagnosed with lung cancer.  Currently undergoing carboplatin/paclitaxel chemotherapy regimen.  Patient received peripheral chemotherapy to his left forearm on 01/03/2015.  Patient return to the [facility] today to receive his Neulasta injection.  He notes some increased mild erythema and edema to his left forearm for the past few days.  He denies any specific discomfort to the arm.  He also denies any recent fevers or chills.  Exam revealed healed IV insertion site to the left forearm; with some mild surrounding edema and trace erythema only.  There were no red streaks noted.  Also, there were some corded veins as well.  Most likely, patient is experiencing some mild phlebitis.  Blood counts obtained recently were within normal limits.  Patient was advised to try taking some low-dose ibuprofen to see if this helps with inflammation.  Also, patient was advised to call/return for difficulty to the emergency department for any worsening symptoms whatsoever.   HPI  ROS  Past Medical History  Diagnosis Date  . Complication of anesthesia     " sometimes I wake up during surgery "  . Anxiety   . Depression   . Lung cancer ([place])     non small cell lung cancer    Past Surgical History  Procedure Laterality Date  . Foot amputation Right     traumatic right lower extremity  . Amputation Right 05/21/2012    Procedure: AMPUTATION BELOW KNEE;  Surgeon: Newt Minion, MD;  Location: [facility];  Service: Orthopedics;  Laterality: Right;  . Upper jaw      SURGERY FOR INFECTION  . Colonoscopy w/ biopsies and polypectomy    . 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    has No Known Allergies.    Medication List       This list is accurate as of: 01/28/15 11:59 PM.  Always use your most recent med list.               acetaminophen 500 MG tablet  Commonly known as:  TYLENOL  Take 500 mg by mouth every 6 (six) hours as needed.     albuterol 108 (90 Base) MCG/ACT inhaler  Commonly known as:  PROVENTIL HFA;VENTOLIN HFA  Inhale 2 puffs into the lungs every 6 (six) hours as needed for wheezing or shortness of breath.     diphenhydramine-acetaminophen 25-500 MG Tabs tablet  Commonly known as:  TYLENOL PM  Take 1 tablet by mouth at bedtime as needed.     HYDROcodone-acetaminophen 5-325 MG tablet  Commonly known as:  NORCO/VICODIN  Take 1 tablet by mouth every 6 (six) hours as needed for moderate pain.     ibuprofen 200 MG tablet  Commonly known as:  ADVIL,MOTRIN  Take 200 mg by mouth every 6 (six) hours as needed.     OVER THE COUNTER MEDICATION  Take 480 mLs by mouth daily. Joint  juice     prochlorperazine 10 MG tablet  Commonly known as:  COMPAZINE  Take 1 tablet (10 mg total) by mouth every 6 (six) hours as needed for nausea or vomiting.         PHYSICAL EXAMINATION  Oncology Vitals 01/28/2015 01/24/2015  Height - 168 cm  Weight - 57.425 kg  Weight (lbs) - 126 lbs 10 oz  BMI (kg/m2) - 20.43 kg/m2  Temp 98.2 97  Pulse 102 100  Resp - 18  Resp (Historical as of 09/02/11) - -  SpO2 - 100  BSA (m2) - 1.63 m2   BP Readings from Last 2 Encounters:  01/28/15 121/89  01/24/15 123/88    Physical Exam  Constitutional: He is oriented to person, place, and time and well-developed, well-nourished, and in no distress.    HENT:  Head: Normocephalic and atraumatic.  Eyes: Conjunctivae and EOM are normal. Pupils are equal, round, and reactive to light. Right eye exhibits no discharge. Left eye exhibits no discharge. No scleral icterus.  Pulmonary/Chest: Effort normal. No respiratory distress.  Musculoskeletal: Normal range of motion. He exhibits edema. He exhibits no tenderness.  Neurological: He is alert and oriented to person, place, and time. Gait normal.  Skin: Skin is warm and dry. No rash noted. There is erythema. No pallor.  Exam revealed healed IV insertion site to the left forearm; with some mild surrounding edema and trace erythema only.  There were no red streaks noted.  Also, there were some corded veins as well.  Most likely, patient is experiencing some mild phlebitis.    Psychiatric: Affect normal.  Nursing note and vitals reviewed.   LABORATORY DATA:. No visits with results within 3 Day(s) from this visit. Latest known visit with results is:  Appointment on 01/23/2015  Component Date Value Ref Range Status  . WBC 01/23/2015 8.5  4.0 - 10.3 10e3/uL Final  . NEUT# 01/23/2015 7.2* 1.5 - 6.5 10e3/uL Final  . HGB 01/23/2015 14.2  13.0 - 17.1 g/dL Final  . HCT 01/23/2015 43.7  38.4 - 49.9 % Final  . Platelets 01/23/2015 342  140 - 400 10e3/uL Final  . MCV 01/23/2015 91.1  79.3 - 98.0 fL Final  . MCH 01/23/2015 29.5  27.2 - 33.4 pg Final  . MCHC 01/23/2015 32.4  32.0 - 36.0 g/dL Final  . RBC 01/23/2015 4.80  4.20 - 5.82 10e6/uL Final  . RDW 01/23/2015 15.2* 11.0 - 14.6 % Final  . lymph# 01/23/2015 0.5* 0.9 - 3.3 10e3/uL Final  . MONO# 01/23/2015 0.6  0.1 - 0.9 10e3/uL Final  . Eosinophils Absolute 01/23/2015 0.2  0.0 - 0.5 10e3/uL Final  . Basophils Absolute 01/23/2015 0.0  0.0 - 0.1 10e3/uL Final  . NEUT% 01/23/2015 84.8* 39.0 - 75.0 % Final  . LYMPH% 01/23/2015 5.5* 14.0 - 49.0 % Final  . MONO% 01/23/2015 6.9  0.0 - 14.0 % Final  . EOS% 01/23/2015 2.3  0.0 - 7.0 % Final  . BASO%  01/23/2015 0.5  0.0 - 2.0 % Final  . Sodium 01/23/2015 138  136 - 145 mEq/L Final  . Potassium 01/23/2015 3.8  3.5 - 5.1 mEq/L Final  . Chloride 01/23/2015 106  98 - 109 mEq/L Final  . CO2 01/23/2015 22  22 - 29 mEq/L Final  . Glucose 01/23/2015 127  70 - 140 mg/dl Final   Glucose reference range is for nonfasting patients. Fasting glucose reference range is 70- 100.  [name] BUN 01/23/2015 9.4  7.0 - 26.0  mg/dL Final  . Creatinine 01/23/2015 0.9  0.7 - 1.3 mg/dL Final  . Total Bilirubin 01/23/2015 0.36  0.20 - 1.20 mg/dL Final  . Alkaline Phosphatase 01/23/2015 151* 40 - 150 U/L Final  . AST 01/23/2015 14  5 - 34 U/L Final  . ALT 01/23/2015 12  0 - 55 U/L Final  . Total Protein 01/23/2015 7.2  6.4 - 8.3 g/dL Final  . Albumin 01/23/2015 3.5  3.5 - 5.0 g/dL Final  . Calcium 01/23/2015 9.2  8.4 - 10.4 mg/dL Final  . Anion Gap 01/23/2015 10  3 - 11 mEq/L Final  . EGFR 01/23/2015 >90  >90 ml/min/1.73 m2 Final   eGFR is calculated using the CKD-EPI Creatinine Equation (2009)     RADIOGRAPHIC STUDIES: No results found.  ASSESSMENT/PLAN:    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, stage III - 2016 Patient received cycle 2 of his carboplatin/paclitaxel chemotherapy regimen on 01/24/2015.  Patient is scheduled for labs only on 02/07/2015.  Patient is scheduled for labs, visit, and chemotherapy again on 02/13/2015.  Phlebitis Patient received peripheral chemotherapy to his left forearm on 01/03/2015.  Patient return to the [facility] today to receive his Neulasta injection.  He notes some increased mild erythema and edema to his left forearm for the past few days.  He denies any specific discomfort to the arm.  He also denies any recent fevers or chills.  Exam revealed healed IV insertion site to the left forearm; with some mild surrounding edema and trace erythema only.  There were no red streaks noted.  Also, there were some corded veins as well.  Most likely, patient is experiencing some  mild phlebitis.  Blood counts obtained recently were within normal limits.  Patient was advised to try taking some low-dose ibuprofen to see if this helps with inflammation.  Also, patient was advised to call/return for difficulty to the emergency department for any worsening symptoms whatsoever.  Patient stated understanding of all instructions; and was in agreement with this plan of care. The patient knows to call the clinic with any problems, questions or concerns.   Review/collaboration with Dr. Julien Nordmann regarding all aspects of patient's visit today.   Total time spent with patient was 15 minutes;  with greater than 75 percent of that time spent in face to face counseling regarding patient's symptoms,  and coordination of care and follow up.  Disclaimer:This dictation was prepared with Dragon/digital dictation along with [employer]. Any transcriptional errors that result from this process are unintentional.  Drue Second, NP 02/04/2015

## 2015-02-04 NOTE — Assessment & Plan Note (Signed)
Patient received cycle 2 of his carboplatin/paclitaxel chemotherapy regimen on 01/24/2015.  Patient is scheduled for labs only on 02/07/2015.  Patient is scheduled for labs, visit, and chemotherapy again on 02/13/2015.

## 2015-02-05 ENCOUNTER — Telehealth: Payer: Self-pay | Admitting: Nurse Practitioner

## 2015-02-05 NOTE — Telephone Encounter (Signed)
per pof pt to follow appts as sch

## 2015-02-07 ENCOUNTER — Other Ambulatory Visit (HOSPITAL_BASED_OUTPATIENT_CLINIC_OR_DEPARTMENT_OTHER): Payer: Medicare Other

## 2015-02-07 DIAGNOSIS — C3412 Malignant neoplasm of upper lobe, left bronchus or lung: Secondary | ICD-10-CM | POA: Diagnosis not present

## 2015-02-07 LAB — CBC WITH DIFFERENTIAL/PLATELET
BASO%: 0.2 % (ref 0.0–2.0)
BASOS ABS: 0 10*3/uL (ref 0.0–0.1)
EOS ABS: 0.1 10*3/uL (ref 0.0–0.5)
EOS%: 0.6 % (ref 0.0–7.0)
HEMATOCRIT: 40.8 % (ref 38.4–49.9)
HEMOGLOBIN: 13.4 g/dL (ref 13.0–17.1)
LYMPH#: 1.3 10*3/uL (ref 0.9–3.3)
LYMPH%: 7 % — ABNORMAL LOW (ref 14.0–49.0)
MCH: 29.9 pg (ref 27.2–33.4)
MCHC: 32.8 g/dL (ref 32.0–36.0)
MCV: 91.1 fL (ref 79.3–98.0)
MONO#: 0.8 10*3/uL (ref 0.1–0.9)
MONO%: 4.1 % (ref 0.0–14.0)
NEUT#: 16.3 10*3/uL — ABNORMAL HIGH (ref 1.5–6.5)
NEUT%: 88.1 % — AB (ref 39.0–75.0)
Platelets: 322 10*3/uL (ref 140–400)
RBC: 4.48 10*6/uL (ref 4.20–5.82)
RDW: 14.8 % — ABNORMAL HIGH (ref 11.0–14.6)
WBC: 18.5 10*3/uL — ABNORMAL HIGH (ref 4.0–10.3)

## 2015-02-07 LAB — COMPREHENSIVE METABOLIC PANEL
ALBUMIN: 3.9 g/dL (ref 3.5–5.0)
ALK PHOS: 240 U/L — AB (ref 40–150)
ALT: 16 U/L (ref 0–55)
ANION GAP: 10 meq/L (ref 3–11)
AST: 15 U/L (ref 5–34)
BUN: 11.4 mg/dL (ref 7.0–26.0)
CALCIUM: 9.4 mg/dL (ref 8.4–10.4)
CHLORIDE: 105 meq/L (ref 98–109)
CO2: 24 mEq/L (ref 22–29)
Creatinine: 0.9 mg/dL (ref 0.7–1.3)
Glucose: 67 mg/dl — ABNORMAL LOW (ref 70–140)
Potassium: 4.5 mEq/L (ref 3.5–5.1)
Sodium: 139 mEq/L (ref 136–145)
Total Bilirubin: <0.3 mg/dL (ref 0.20–1.20)
Total Protein: 7.8 g/dL (ref 6.4–8.3)

## 2015-02-13 ENCOUNTER — Encounter: Payer: Self-pay | Admitting: Internal Medicine

## 2015-02-13 ENCOUNTER — Other Ambulatory Visit (HOSPITAL_BASED_OUTPATIENT_CLINIC_OR_DEPARTMENT_OTHER): Payer: Medicare Other

## 2015-02-13 ENCOUNTER — Telehealth: Payer: Self-pay | Admitting: Internal Medicine

## 2015-02-13 ENCOUNTER — Ambulatory Visit (HOSPITAL_BASED_OUTPATIENT_CLINIC_OR_DEPARTMENT_OTHER): Payer: Medicare Other

## 2015-02-13 ENCOUNTER — Ambulatory Visit (HOSPITAL_BASED_OUTPATIENT_CLINIC_OR_DEPARTMENT_OTHER): Payer: Medicare Other | Admitting: Internal Medicine

## 2015-02-13 VITALS — BP 112/70 | HR 85 | Temp 98.0°F | Resp 18 | Ht 66.0 in | Wt 125.0 lb

## 2015-02-13 DIAGNOSIS — Z5111 Encounter for antineoplastic chemotherapy: Secondary | ICD-10-CM

## 2015-02-13 DIAGNOSIS — C3412 Malignant neoplasm of upper lobe, left bronchus or lung: Secondary | ICD-10-CM | POA: Diagnosis not present

## 2015-02-13 LAB — CBC WITH DIFFERENTIAL/PLATELET
BASO%: 1.1 % (ref 0.0–2.0)
Basophils Absolute: 0.1 10*3/uL (ref 0.0–0.1)
EOS%: 2.5 % (ref 0.0–7.0)
Eosinophils Absolute: 0.2 10*3/uL (ref 0.0–0.5)
HEMATOCRIT: 40.9 % (ref 38.4–49.9)
HEMOGLOBIN: 13.6 g/dL (ref 13.0–17.1)
LYMPH#: 0.9 10*3/uL (ref 0.9–3.3)
LYMPH%: 14.2 % (ref 14.0–49.0)
MCH: 29.9 pg (ref 27.2–33.4)
MCHC: 33.3 g/dL (ref 32.0–36.0)
MCV: 89.9 fL (ref 79.3–98.0)
MONO#: 0.5 10*3/uL (ref 0.1–0.9)
MONO%: 8.3 % (ref 0.0–14.0)
NEUT#: 4.8 10*3/uL (ref 1.5–6.5)
NEUT%: 73.9 % (ref 39.0–75.0)
NRBC: 0 % (ref 0–0)
Platelets: 312 10*3/uL (ref 140–400)
RBC: 4.55 10*6/uL (ref 4.20–5.82)
RDW: 14.8 % — AB (ref 11.0–14.6)
WBC: 6.4 10*3/uL (ref 4.0–10.3)

## 2015-02-13 LAB — COMPREHENSIVE METABOLIC PANEL
ALBUMIN: 3.8 g/dL (ref 3.5–5.0)
ALK PHOS: 159 U/L — AB (ref 40–150)
ALT: 24 U/L (ref 0–55)
AST: 20 U/L (ref 5–34)
Anion Gap: 11 mEq/L (ref 3–11)
BUN: 16.1 mg/dL (ref 7.0–26.0)
CALCIUM: 9.4 mg/dL (ref 8.4–10.4)
CO2: 18 mEq/L — ABNORMAL LOW (ref 22–29)
CREATININE: 0.9 mg/dL (ref 0.7–1.3)
Chloride: 107 mEq/L (ref 98–109)
EGFR: >90 mL/min/{1.73_m2} (ref >90)
GLUCOSE: 117 mg/dL (ref 70–140)
Potassium: 4.9 mEq/L (ref 3.5–5.1)
SODIUM: 137 meq/L (ref 136–145)
TOTAL PROTEIN: 7.8 g/dL (ref 6.4–8.3)

## 2015-02-13 MED ORDER — SODIUM CHLORIDE 0.9 % IV SOLN
467.0000 mg | Freq: Once | INTRAVENOUS | Status: AC
  Administered 2015-02-13: 470 mg via INTRAVENOUS
  Filled 2015-02-13: qty 47

## 2015-02-13 MED ORDER — PACLITAXEL CHEMO INJECTION 300 MG/50ML
175.0000 mg/m2 | Freq: Once | INTRAVENOUS | Status: AC
  Administered 2015-02-13: 288 mg via INTRAVENOUS
  Filled 2015-02-13: qty 48

## 2015-02-13 MED ORDER — FAMOTIDINE IN NACL 20-0.9 MG/50ML-% IV SOLN
20.0000 mg | Freq: Once | INTRAVENOUS | Status: AC
  Administered 2015-02-13: 20 mg via INTRAVENOUS

## 2015-02-13 MED ORDER — DIPHENHYDRAMINE HCL 50 MG/ML IJ SOLN
50.0000 mg | Freq: Once | INTRAMUSCULAR | Status: AC
  Administered 2015-02-13: 50 mg via INTRAVENOUS

## 2015-02-13 MED ORDER — SODIUM CHLORIDE 0.9 % IV SOLN
Freq: Once | INTRAVENOUS | Status: AC
  Administered 2015-02-13: 11:00:00 via INTRAVENOUS
  Filled 2015-02-13: qty 8

## 2015-02-13 MED ORDER — CARBOPLATIN CHEMO INTRADERMAL TEST DOSE 100MCG/0.02ML
100.0000 ug | Freq: Once | INTRADERMAL | Status: AC
  Administered 2015-02-13: 100 ug via INTRADERMAL
  Filled 2015-02-13: qty 0.01

## 2015-02-13 MED ORDER — FAMOTIDINE IN NACL 20-0.9 MG/50ML-% IV SOLN
INTRAVENOUS | Status: AC
  Filled 2015-02-13: qty 50

## 2015-02-13 MED ORDER — SODIUM CHLORIDE 0.9 % IV SOLN
Freq: Once | INTRAVENOUS | Status: AC
  Administered 2015-02-13: 10:00:00 via INTRAVENOUS

## 2015-02-13 MED ORDER — DIPHENHYDRAMINE HCL 50 MG/ML IJ SOLN
INTRAMUSCULAR | Status: AC
  Filled 2015-02-13: qty 1

## 2015-02-13 NOTE — Patient Instructions (Addendum)
[facility] Discharge Instructions for Patients Receiving Chemotherapy  Today you received the following chemotherapy agents Taxol and Carboplatin. To help prevent nausea and vomiting after your treatment, we encourage you to take your nausea medication as directed.  If you develop nausea and vomiting that is not controlled by your nausea medication, call the clinic.   BELOW ARE SYMPTOMS THAT SHOULD BE REPORTED IMMEDIATELY:  *FEVER GREATER THAN 100.5 F  *CHILLS WITH OR WITHOUT FEVER  NAUSEA AND VOMITING THAT IS NOT CONTROLLED WITH YOUR NAUSEA MEDICATION  *UNUSUAL SHORTNESS OF BREATH  *UNUSUAL BRUISING OR BLEEDING  TENDERNESS IN MOUTH AND THROAT WITH OR WITHOUT PRESENCE OF ULCERS  *URINARY PROBLEMS  *BOWEL PROBLEMS  UNUSUAL RASH Items with * indicate a potential emergency and should be followed up as soon as possible.  Feel free to call the clinic you have any questions or concerns. The clinic phone number is [phone].  Please show the CHEMO ALERT CARD at check-in to the Emergency Department and triage nurse.    

## 2015-02-13 NOTE — Telephone Encounter (Signed)
Gv pt appts for Jan - Feb. Advised c-sched will call for make scan appt.

## 2015-02-13 NOTE — Progress Notes (Signed)
[place] Telephone:([phone]) [phone]   [phone]) [phone]  OFFICE PROGRESS NOTE  No PCP Per Patient No address on file  PRINCIPAL DIAGNOSIS: 1) stage IIIA (T2a, N2, M0) non-small cell lung cancer, squamous cell carcinoma diagnosed in August 2016 2)  Stage IIIA (T2 N2 Mx) non-small cell lung cancer, adenocarcinoma, involving the right upper lobe and mediastinal lymphadenopathy diagnosed in June 2010.   PRIOR THERAPY:  1. Status post concurrent chemoradiation with weekly carboplatin and paclitaxel; last dose was given April 08, 2008. 2. Status post 3 cycles of consolidation chemotherapy with docetaxel; last dose was given on Jun 26, 2008. Concurrent chemoradiation with weekly carboplatin for AUC of 2 and paclitaxel 45 MG/M2. First dose expected on 09/23/2014 status post 7 cycles. Last dose was given 12/01/2014 with partial response.  CURRENT THERAPY: Consolidation chemotherapy with carboplatin for AUC of 5 and paclitaxel 175 MG/M2 every 3 weeks with Neulasta support. First dose 01/02/2015. Status post 2 cycles.  INTERVAL HISTORY: Evan [name] 65 y.o. male returns to the clinic today for followup visit. The patient is currently on consolidation chemotherapy with carboplatin and paclitaxel status post 2 cycles and tolerating his treatment fairly well. He is feeling much better today. He denied having any cough, shortness of breath or hemoptysis. The patient denied having any significant weight loss or night sweats. He has no nausea or vomiting. He denied having any significant peripheral neuropathy. The patient is here today to start cycle #3 of his treatment.  MEDICAL HISTORY: Past Medical History  Diagnosis Date  . Complication of anesthesia     " sometimes I wake up during surgery "  . Anxiety   . Depression   . Lung cancer ([place])     non small cell lung cancer    ALLERGIES:  has No Known Allergies.  MEDICATIONS:  Current Outpatient Prescriptions  Medication  Sig Dispense Refill  . acetaminophen (TYLENOL) 500 MG tablet Take 500 mg by mouth every 6 (six) hours as needed.    [name] albuterol (PROVENTIL HFA;VENTOLIN HFA) 108 (90 BASE) MCG/ACT inhaler Inhale 2 puffs into the lungs every 6 (six) hours as needed for wheezing or shortness of breath. 1 Inhaler 2  . diphenhydramine-acetaminophen (TYLENOL PM) 25-500 MG TABS Take 1 tablet by mouth at bedtime as needed.    [name] HYDROcodone-acetaminophen (NORCO/VICODIN) 5-325 MG per tablet Take 1 tablet by mouth every 6 (six) hours as needed for moderate pain. 30 tablet 0  . ibuprofen (ADVIL,MOTRIN) 200 MG tablet Take 200 mg by mouth every 6 (six) hours as needed.    [name] OVER THE COUNTER MEDICATION Take 480 mLs by mouth daily. Joint juice    . prochlorperazine (COMPAZINE) 10 MG tablet Take 1 tablet (10 mg total) by mouth every 6 (six) hours as needed for nausea or vomiting. 30 tablet 0   No current facility-administered medications for this visit.    SURGICAL HISTORY:  Past Surgical History  Procedure Laterality Date  . Foot amputation Right     traumatic right lower extremity  . Amputation Right 05/21/2012    Procedure: AMPUTATION BELOW KNEE;  Surgeon: Newt Minion, MD;  Location: [facility];  Service: Orthopedics;  Laterality: Right;  . Upper jaw      SURGERY FOR INFECTION  . Colonoscopy w/ biopsies and polypectomy    . Amputation Right 06/21/2012    Procedure: Revision AMPUTATION BELOW KNEE Right;  Surgeon: Newt Minion, MD;  Location: [facility];  Service: Orthopedics;  Laterality: Right;  Revision right Below Knee Amputation    REVIEW OF SYSTEMS:  A comprehensive review of systems was negative except for: Constitutional: positive for fatigue   PHYSICAL EXAMINATION: General appearance: alert, cooperative, fatigued and no distress Head: Normocephalic, without obvious abnormality, atraumatic Neck: no adenopathy Lymph nodes: Cervical, supraclavicular, and axillary nodes normal. Resp: clear to auscultation  bilaterally Back: symmetric, no curvature. ROM normal. No CVA tenderness. Cardio: regular rate and rhythm, S1, S2 normal, no murmur, click, rub or gallop GI: soft, non-tender; bowel sounds normal; no masses,  no organomegaly Extremities: Right below knee amputation Neurologic: Alert and oriented X 3, normal strength and tone. Normal symmetric reflexes. Normal coordination and gait  ECOG PERFORMANCE STATUS: 1 - Symptomatic but completely ambulatory  Blood pressure 112/70, pulse 85, temperature 98 F (36.7 C), temperature source Oral, resp. rate 18, height '5\' 6"'$  (1.676 m), weight 125 lb (56.7 kg), SpO2 100 %.  LABORATORY DATA: Lab Results  Component Value Date   WBC 18.5* 02/07/2015   HGB 13.4 02/07/2015   HCT 40.8 02/07/2015   MCV 91.1 02/07/2015   PLT 322 02/07/2015      Chemistry      Component Value Date/Time   NA 139 02/07/2015 1316   NA 138 07/24/2013 1454   NA 139 07/20/2011 1312   K 4.5 02/07/2015 1316   K 4.4 07/24/2013 1454   K 4.8* 07/20/2011 1312   CL 101 07/24/2013 1454   CL 104 07/19/2012 1432   CL 102 07/20/2011 1312   CO2 24 02/07/2015 1316   CO2 23 07/24/2013 1454   CO2 28 07/20/2011 1312   BUN 11.4 02/07/2015 1316   BUN 11 07/24/2013 1454   BUN 9 07/20/2011 1312   CREATININE 0.9 02/07/2015 1316   CREATININE 0.86 07/24/2013 1454   CREATININE 1.3* 07/20/2011 1312      Component Value Date/Time   CALCIUM 9.4 02/07/2015 1316   CALCIUM 9.4 07/24/2013 1454   CALCIUM 8.9 07/20/2011 1312   ALKPHOS 240* 02/07/2015 1316   ALKPHOS 113 07/24/2013 1454   ALKPHOS 88* 07/20/2011 1312   AST 15 02/07/2015 1316   AST 14 07/24/2013 1454   AST 21 07/20/2011 1312   ALT 16 02/07/2015 1316   ALT 14 07/24/2013 1454   ALT 27 07/20/2011 1312   BILITOT <0.30 02/07/2015 1316   BILITOT 0.4 07/24/2013 1454   BILITOT 0.60 07/20/2011 1312       RADIOGRAPHIC STUDIES: No results found.  ASSESSMENT AND PLAN: This is a very pleasant 65 years old African American male  with history of stage IIIa non-small cell lung cancer status post concurrent chemoradiation followed by 3 cycles of consolidation chemotherapy and has been observation since May of 2010 with no evidence for disease progression. The patient completed a course of concurrent chemoradiation for recurrent non-small cell lung cancer, squamous cell carcinoma involving the left upper lobe and mediastinal lymphadenopathy. He is status post 7 cycles with partial response. The patient is currently undergoing consolidation chemotherapy with carboplatin and paclitaxel status post 2 cycles. I recommended for him to proceed with cycle #3 today as scheduled. The patient would come back for follow-up visit in one month for reevaluation after repeating CT scan of the chest for restaging of his disease. The patient was advised to call immediately if he has any concerning symptoms in the interval. He was advised to call immediately if she has any concerning symptoms in the interval.  All questions were answered. The patient knows to  call the clinic with any problems, questions or concerns. We can certainly see the patient much sooner if necessary.  Disclaimer: This note was dictated with voice recognition software. Similar sounding words can inadvertently be transcribed and may not be corrected upon review.

## 2015-02-14 ENCOUNTER — Ambulatory Visit (INDEPENDENT_AMBULATORY_CARE_PROVIDER_SITE_OTHER): Payer: Medicare Other | Admitting: Adult Health

## 2015-02-14 ENCOUNTER — Encounter: Payer: Self-pay | Admitting: Adult Health

## 2015-02-14 VITALS — BP 122/82 | HR 96 | Temp 98.3°F | Ht 65.0 in | Wt 124.8 lb

## 2015-02-14 DIAGNOSIS — J449 Chronic obstructive pulmonary disease, unspecified: Secondary | ICD-10-CM | POA: Diagnosis not present

## 2015-02-14 DIAGNOSIS — C3412 Malignant neoplasm of upper lobe, left bronchus or lung: Secondary | ICD-10-CM

## 2015-02-14 MED ORDER — SPIRIVA RESPIMAT 2.5 MCG/ACT IN AERS: 2.0000 | INHALATION_SPRAY | Freq: Every day | RESPIRATORY_TRACT | Status: DC

## 2015-02-14 NOTE — Assessment & Plan Note (Signed)
Symptomatic on rescue inhaler  Trial of Spiriva daily   Plan  Begin Spiriva daily. Rinse after inhaler use Continue with follow-up with Dr. Earlie Server as directed. Follow-up for planned CT chest , next month .  Follow up with Dr. Elsworth Soho  3-4 months and As needed

## 2015-02-14 NOTE — Progress Notes (Signed)
Subjective:    Patient ID: Evan [name], male    DOB: May 16, 1950, 65 y.o.   MRN: [id number]  HPI 65 yo male former smoker with COPD and Stage IIIA NSCLC (Adenocarcinoma) in Rt lung 2010 and Lt lung 2016. Undergoing concurrent chemotherapy with carboplatin and paclitaxel.  He has also been getting XRT.   TESTS: PFT 02/08/08 >> FEV1 1.87 (68%), FEV1% 67, TLC 5.6 (88%), DLCO 91%, +BD   02/14/2015 Follow up : COPD and Lung Cancer  Returns for  4 month follow up .  Pt states breathing is up and down. Still SOB, using albuterol inhaler. Wheezing ,worse when laying down at times. No fever or discolored mucus.   Sim Boast any cp/tightness. Since last visit pt states that he has occ burning feeling in lungs, can be noticed with weather changes. Weak from chemo  Following with Dr. Julien Nordmann for Lung cancer.  Has upcoming CT chest next month for staging.  Finished XRT in Oct . Had chemo yesterday .  Last CT chest showed partial response with decreased LUL mass (3.6cm)  Denies any hemoptysis, orthopnea, PND, or increased leg swelling.   Past Medical History  Diagnosis Date  . Complication of anesthesia     " sometimes I wake up during surgery "  . Anxiety   . Depression   . Lung cancer ([place])     non small cell lung cancer   Current Outpatient Prescriptions on File Prior to Visit  Medication Sig Dispense Refill  . acetaminophen (TYLENOL) 500 MG tablet Take 500 mg by mouth every 6 (six) hours as needed.    [name] albuterol (PROVENTIL HFA;VENTOLIN HFA) 108 (90 BASE) MCG/ACT inhaler Inhale 2 puffs into the lungs every 6 (six) hours as needed for wheezing or shortness of breath. 1 Inhaler 2  . diphenhydramine-acetaminophen (TYLENOL PM) 25-500 MG TABS Take 1 tablet by mouth at bedtime as needed.    [name] HYDROcodone-acetaminophen (NORCO/VICODIN) 5-325 MG per tablet Take 1 tablet by mouth every 6 (six) hours as needed for moderate pain. 30 tablet 0  . ibuprofen (ADVIL,MOTRIN) 200 MG tablet Take 200 mg by  mouth every 6 (six) hours as needed.    [name] OVER THE COUNTER MEDICATION Take 480 mLs by mouth daily. Joint juice    . prochlorperazine (COMPAZINE) 10 MG tablet Take 1 tablet (10 mg total) by mouth every 6 (six) hours as needed for nausea or vomiting. 30 tablet 0   No current facility-administered medications on file prior to visit.       Review of Systems Constitutional:   No  weight loss, night sweats,  Fevers, chills, fatigue, or  lassitude.  HEENT:   No headaches,  Difficulty swallowing,  Tooth/dental problems, or  Sore throat,                No sneezing, itching, ear ache, nasal congestion, post nasal drip,   CV:  No chest pain,  Orthopnea, PND, swelling in lower extremities, anasarca, dizziness, palpitations, syncope.   GI  No heartburn, indigestion, abdominal pain, nausea, vomiting, diarrhea, change in bowel habits, loss of appetite, bloody stools.   Resp:   No chest wall deformity  Skin: no rash or lesions.  GU: no dysuria, change in color of urine, no urgency or frequency.  No flank pain, no hematuria   MS:  No joint pain or swelling.  No decreased range of motion.  No back pain.  Psych:  No change in mood or affect. No depression or  anxiety.  No memory loss.         Objective:   Physical Exam Filed Vitals:   02/14/15 1449  BP: 122/82  Pulse: 96  Temp: 98.3 F (36.8 C)  TempSrc: Oral  Height: '5\' 5"'$  (1.651 m)  Weight: 124 lb 12.8 oz (56.609 kg)  SpO2: 100%     GEN: A/Ox3; pleasant , NAD,   HEENT:  [place]/AT,  EACs-clear, TMs-wnl, NOSE-clear, THROAT-clear, no lesions, no postnasal drip or exudate noted.   NECK:  Supple w/ fair ROM; no JVD; normal carotid impulses w/o bruits; no thyromegaly or nodules palpated; no lymphadenopathy.  RESP  Decreased BS in bases w/o, wheezes/ rales/ or rhonchi.no accessory muscle use, no dullness to percussion  CARD:  RRR, no m/r/g  , no peripheral edema, pulses intact, no cyanosis or clubbing.  GI:   Soft & nt; nml bowel  sounds; no organomegaly or masses detected.  Musco: Warm bil,  Right LE prosthesis   Neuro: alert, no focal deficits noted.    Skin: Warm, no lesions or rashes         Assessment & Plan:

## 2015-02-14 NOTE — Addendum Note (Signed)
Addended by: Osa Craver on: 02/14/2015 03:28 PM   Modules accepted: Orders

## 2015-02-14 NOTE — Patient Instructions (Addendum)
Begin Spiriva daily. Rinse after inhaler use Continue with follow-up with Dr. Earlie Server as directed. Follow-up for planned CT chest , next month .  Follow up with Dr. Elsworth Soho  3-4 months and As needed

## 2015-02-14 NOTE — Assessment & Plan Note (Signed)
Cont w/ chemo as directed Plan  Continue with follow-up with Dr. Earlie Server as directed. Follow-up for planned CT chest , next month .  Follow up with Dr. Elsworth Soho  3-4 months and As needed

## 2015-02-15 ENCOUNTER — Ambulatory Visit (HOSPITAL_BASED_OUTPATIENT_CLINIC_OR_DEPARTMENT_OTHER): Payer: Medicare Other

## 2015-02-15 VITALS — BP 128/93 | HR 101 | Temp 97.9°F | Resp 18

## 2015-02-15 DIAGNOSIS — C3412 Malignant neoplasm of upper lobe, left bronchus or lung: Secondary | ICD-10-CM | POA: Diagnosis not present

## 2015-02-15 MED ORDER — PEGFILGRASTIM INJECTION 6 MG/0.6ML ~~LOC~~
6.0000 mg | PREFILLED_SYRINGE | Freq: Once | SUBCUTANEOUS | Status: AC
  Administered 2015-02-15: 6 mg via SUBCUTANEOUS

## 2015-02-15 NOTE — Patient Instructions (Signed)
Pegfilgrastim injection What is this medicine? PEGFILGRASTIM (PEG fil gra stim) is a long-acting granulocyte colony-stimulating factor that stimulates the growth of neutrophils, a type of white blood cell important in the body's fight against infection. It is used to reduce the incidence of fever and infection in patients with certain types of cancer who are receiving chemotherapy that affects the bone marrow, and to increase survival after being exposed to high doses of radiation. This medicine may be used for other purposes; ask your health care provider or pharmacist if you have questions. What should I tell my health care provider before I take this medicine? They need to know if you have any of these conditions: -kidney disease -latex allergy -ongoing radiation therapy -sickle cell disease -skin reactions to acrylic adhesives (On-Body Injector only) -an unusual or allergic reaction to pegfilgrastim, filgrastim, other medicines, foods, dyes, or preservatives -pregnant or trying to get pregnant -breast-feeding How should I use this medicine? This medicine is for injection under the skin. If you get this medicine at home, you will be taught how to prepare and give the pre-filled syringe or how to use the On-body Injector. Refer to the patient Instructions for Use for detailed instructions. Use exactly as directed. Take your medicine at regular intervals. Do not take your medicine more often than directed. It is important that you put your used needles and syringes in a special sharps container. Do not put them in a trash can. If you do not have a sharps container, call your pharmacist or healthcare provider to get one. Talk to your pediatrician regarding the use of this medicine in children. While this drug may be prescribed for selected conditions, precautions do apply. Overdosage: If you think you have taken too much of this medicine contact a poison control center or emergency room at  once. NOTE: This medicine is only for you. Do not share this medicine with others. What if I miss a dose? It is important not to miss your dose. Call your doctor or health care professional if you miss your dose. If you miss a dose due to an On-body Injector failure or leakage, a new dose should be administered as soon as possible using a single prefilled syringe for manual use. What may interact with this medicine? Interactions have not been studied. Give your health care provider a list of all the medicines, herbs, non-prescription drugs, or dietary supplements you use. Also tell them if you smoke, drink alcohol, or use illegal drugs. Some items may interact with your medicine. This list may not describe all possible interactions. Give your health care provider a list of all the medicines, herbs, non-prescription drugs, or dietary supplements you use. Also tell them if you smoke, drink alcohol, or use illegal drugs. Some items may interact with your medicine. What should I watch for while using this medicine? You may need blood work done while you are taking this medicine. If you are going to need a MRI, CT scan, or other procedure, tell your doctor that you are using this medicine (On-Body Injector only). What side effects may I notice from receiving this medicine? Side effects that you should report to your doctor or health care professional as soon as possible: -allergic reactions like skin rash, itching or hives, swelling of the face, lips, or tongue -dizziness -fever -pain, redness, or irritation at site where injected -pinpoint red spots on the skin -red or dark-brown urine -shortness of breath or breathing problems -stomach or side pain, or pain   at the shoulder -swelling -tiredness -trouble passing urine or change in the amount of urine Side effects that usually do not require medical attention (report to your doctor or health care professional if they continue or are  bothersome): -bone pain -muscle pain This list may not describe all possible side effects. Call your doctor for medical advice about side effects. You may report side effects to FDA at 1-800-FDA-1088. Where should I keep my medicine? Keep out of the reach of children. Store pre-filled syringes in a refrigerator between 2 and 8 degrees C (36 and 46 degrees F). Do not freeze. Keep in carton to protect from light. Throw away this medicine if it is left out of the refrigerator for more than 48 hours. Throw away any unused medicine after the expiration date. NOTE: This sheet is a summary. It may not cover all possible information. If you have questions about this medicine, talk to your doctor, pharmacist, or health care provider.    2016, [employer]. (2014-02-07 14:30:14)  

## 2015-02-17 NOTE — Progress Notes (Signed)
Reviewed & agree with plan  

## 2015-02-20 ENCOUNTER — Other Ambulatory Visit (HOSPITAL_BASED_OUTPATIENT_CLINIC_OR_DEPARTMENT_OTHER): Payer: Medicare Other

## 2015-02-20 DIAGNOSIS — C3492 Malignant neoplasm of unspecified part of left bronchus or lung: Secondary | ICD-10-CM | POA: Diagnosis not present

## 2015-02-20 LAB — CBC WITH DIFFERENTIAL/PLATELET
BASO%: 0.5 % (ref 0.0–2.0)
BASOS ABS: 0.1 10*3/uL (ref 0.0–0.1)
EOS%: 2.5 % (ref 0.0–7.0)
Eosinophils Absolute: 0.4 10*3/uL (ref 0.0–0.5)
HCT: 36.7 % — ABNORMAL LOW (ref 38.4–49.9)
HEMOGLOBIN: 12.5 g/dL — AB (ref 13.0–17.1)
LYMPH%: 6.1 % — ABNORMAL LOW (ref 14.0–49.0)
MCH: 30.6 pg (ref 27.2–33.4)
MCHC: 34.1 g/dL (ref 32.0–36.0)
MCV: 89.7 fL (ref 79.3–98.0)
MONO#: 1.3 10*3/uL — ABNORMAL HIGH (ref 0.1–0.9)
MONO%: 8.6 % (ref 0.0–14.0)
NEUT#: 12.3 10*3/uL — ABNORMAL HIGH (ref 1.5–6.5)
NEUT%: 82.3 % — ABNORMAL HIGH (ref 39.0–75.0)
Platelets: 167 10*3/uL (ref 140–400)
RBC: 4.09 10*6/uL — AB (ref 4.20–5.82)
RDW: 14.4 % (ref 11.0–14.6)
WBC: 14.9 10*3/uL — ABNORMAL HIGH (ref 4.0–10.3)
lymph#: 0.9 10*3/uL (ref 0.9–3.3)

## 2015-02-20 LAB — COMPREHENSIVE METABOLIC PANEL
ALBUMIN: 3.8 g/dL (ref 3.5–5.0)
ALT: 13 U/L (ref 0–55)
AST: 12 U/L (ref 5–34)
Alkaline Phosphatase: 179 U/L — ABNORMAL HIGH (ref 40–150)
Anion Gap: 8 mEq/L (ref 3–11)
BUN: 16.6 mg/dL (ref 7.0–26.0)
CHLORIDE: 105 meq/L (ref 98–109)
CO2: 24 meq/L (ref 22–29)
Calcium: 9.3 mg/dL (ref 8.4–10.4)
Creatinine: 0.8 mg/dL (ref 0.7–1.3)
EGFR: >90 mL/min/{1.73_m2} (ref >90)
GLUCOSE: 85 mg/dL (ref 70–140)
POTASSIUM: 4.1 meq/L (ref 3.5–5.1)
SODIUM: 137 meq/L (ref 136–145)
Total Bilirubin: <0.3 mg/dL (ref 0.20–1.20)
Total Protein: 7.5 g/dL (ref 6.4–8.3)

## 2015-02-27 ENCOUNTER — Other Ambulatory Visit (HOSPITAL_BASED_OUTPATIENT_CLINIC_OR_DEPARTMENT_OTHER): Payer: Medicare Other

## 2015-02-27 DIAGNOSIS — C3492 Malignant neoplasm of unspecified part of left bronchus or lung: Secondary | ICD-10-CM | POA: Diagnosis not present

## 2015-02-27 LAB — CBC WITH DIFFERENTIAL/PLATELET
BASO%: 0.2 % (ref 0.0–2.0)
BASOS ABS: 0 10*3/uL (ref 0.0–0.1)
EOS%: 0.7 % (ref 0.0–7.0)
Eosinophils Absolute: 0.1 10*3/uL (ref 0.0–0.5)
HEMATOCRIT: 39.8 % (ref 38.4–49.9)
HGB: 13.2 g/dL (ref 13.0–17.1)
LYMPH%: 10.2 % — AB (ref 14.0–49.0)
MCH: 30.2 pg (ref 27.2–33.4)
MCHC: 33.2 g/dL (ref 32.0–36.0)
MCV: 91.1 fL (ref 79.3–98.0)
MONO#: 0.6 10*3/uL (ref 0.1–0.9)
MONO%: 4.6 % (ref 0.0–14.0)
NEUT#: 10.3 10*3/uL — ABNORMAL HIGH (ref 1.5–6.5)
NEUT%: 84.3 % — AB (ref 39.0–75.0)
Platelets: 357 10*3/uL (ref 140–400)
RBC: 4.37 10*6/uL (ref 4.20–5.82)
RDW: 15.2 % — ABNORMAL HIGH (ref 11.0–14.6)
WBC: 12.3 10*3/uL — ABNORMAL HIGH (ref 4.0–10.3)
lymph#: 1.3 10*3/uL (ref 0.9–3.3)

## 2015-02-27 LAB — COMPREHENSIVE METABOLIC PANEL
ALT: 10 U/L (ref 0–55)
AST: 12 U/L (ref 5–34)
Albumin: 3.7 g/dL (ref 3.5–5.0)
Alkaline Phosphatase: 206 U/L — ABNORMAL HIGH (ref 40–150)
Anion Gap: 9 mEq/L (ref 3–11)
BUN: 7.4 mg/dL (ref 7.0–26.0)
CALCIUM: 9.4 mg/dL (ref 8.4–10.4)
CHLORIDE: 105 meq/L (ref 98–109)
CO2: 23 mEq/L (ref 22–29)
Creatinine: 0.8 mg/dL (ref 0.7–1.3)
EGFR: >90 mL/min/{1.73_m2} (ref >90)
Glucose: 69 mg/dl — ABNORMAL LOW (ref 70–140)
POTASSIUM: 3.8 meq/L (ref 3.5–5.1)
SODIUM: 138 meq/L (ref 136–145)
Total Bilirubin: <0.3 mg/dL (ref 0.20–1.20)
Total Protein: 8.1 g/dL (ref 6.4–8.3)

## 2015-03-06 ENCOUNTER — Other Ambulatory Visit (HOSPITAL_BASED_OUTPATIENT_CLINIC_OR_DEPARTMENT_OTHER): Payer: Medicare Other

## 2015-03-06 DIAGNOSIS — C3412 Malignant neoplasm of upper lobe, left bronchus or lung: Secondary | ICD-10-CM | POA: Diagnosis not present

## 2015-03-06 LAB — CBC WITH DIFFERENTIAL/PLATELET
BASO%: 0.8 % (ref 0.0–2.0)
BASOS ABS: 0.1 10*3/uL (ref 0.0–0.1)
EOS ABS: 0.1 10*3/uL (ref 0.0–0.5)
EOS%: 1.7 % (ref 0.0–7.0)
HEMATOCRIT: 40.5 % (ref 38.4–49.9)
HEMOGLOBIN: 13.3 g/dL (ref 13.0–17.1)
LYMPH#: 1 10*3/uL (ref 0.9–3.3)
LYMPH%: 16.6 % (ref 14.0–49.0)
MCH: 29.7 pg (ref 27.2–33.4)
MCHC: 32.8 g/dL (ref 32.0–36.0)
MCV: 90.4 fL (ref 79.3–98.0)
MONO#: 0.6 10*3/uL (ref 0.1–0.9)
MONO%: 9.7 % (ref 0.0–14.0)
NEUT%: 71.2 % (ref 39.0–75.0)
NEUTROS ABS: 4.2 10*3/uL (ref 1.5–6.5)
Platelets: 420 10*3/uL — ABNORMAL HIGH (ref 140–400)
RBC: 4.48 10*6/uL (ref 4.20–5.82)
RDW: 15.7 % — ABNORMAL HIGH (ref 11.0–14.6)
WBC: 6 10*3/uL (ref 4.0–10.3)

## 2015-03-06 LAB — COMPREHENSIVE METABOLIC PANEL
ALBUMIN: 3.8 g/dL (ref 3.5–5.0)
ALK PHOS: 164 U/L — AB (ref 40–150)
ALT: 12 U/L (ref 0–55)
AST: 15 U/L (ref 5–34)
Anion Gap: 11 mEq/L (ref 3–11)
BUN: 10.3 mg/dL (ref 7.0–26.0)
CALCIUM: 9.3 mg/dL (ref 8.4–10.4)
CO2: 22 mEq/L (ref 22–29)
CREATININE: 0.9 mg/dL (ref 0.7–1.3)
Chloride: 105 mEq/L (ref 98–109)
EGFR: >90 mL/min/{1.73_m2} (ref >90)
GLUCOSE: 55 mg/dL — AB (ref 70–140)
POTASSIUM: 3.9 meq/L (ref 3.5–5.1)
SODIUM: 138 meq/L (ref 136–145)
TOTAL PROTEIN: 8.1 g/dL (ref 6.4–8.3)

## 2015-03-12 ENCOUNTER — Encounter: Payer: Self-pay | Admitting: Internal Medicine

## 2015-03-12 ENCOUNTER — Telehealth: Payer: Self-pay | Admitting: Internal Medicine

## 2015-03-12 ENCOUNTER — Other Ambulatory Visit (HOSPITAL_BASED_OUTPATIENT_CLINIC_OR_DEPARTMENT_OTHER): Payer: Medicare Other

## 2015-03-12 ENCOUNTER — Ambulatory Visit (HOSPITAL_BASED_OUTPATIENT_CLINIC_OR_DEPARTMENT_OTHER): Payer: Medicare Other | Admitting: Internal Medicine

## 2015-03-12 VITALS — BP 123/90 | HR 100 | Temp 98.3°F | Resp 18 | Ht 65.0 in | Wt 122.5 lb

## 2015-03-12 DIAGNOSIS — C3412 Malignant neoplasm of upper lobe, left bronchus or lung: Secondary | ICD-10-CM

## 2015-03-12 DIAGNOSIS — C3411 Malignant neoplasm of upper lobe, right bronchus or lung: Secondary | ICD-10-CM

## 2015-03-12 LAB — COMPREHENSIVE METABOLIC PANEL
ALT: 14 U/L (ref 0–55)
AST: 14 U/L (ref 5–34)
Albumin: 3.8 g/dL (ref 3.5–5.0)
Alkaline Phosphatase: 135 U/L (ref 40–150)
Anion Gap: 11 mEq/L (ref 3–11)
BUN: 14.1 mg/dL (ref 7.0–26.0)
CHLORIDE: 104 meq/L (ref 98–109)
CO2: 23 meq/L (ref 22–29)
Calcium: 9.4 mg/dL (ref 8.4–10.4)
Creatinine: 0.9 mg/dL (ref 0.7–1.3)
GLUCOSE: 78 mg/dL (ref 70–140)
POTASSIUM: 4 meq/L (ref 3.5–5.1)
Sodium: 138 mEq/L (ref 136–145)
Total Bilirubin: 0.31 mg/dL (ref 0.20–1.20)
Total Protein: 8 g/dL (ref 6.4–8.3)

## 2015-03-12 LAB — CBC WITH DIFFERENTIAL/PLATELET
BASO%: 1.1 % (ref 0.0–2.0)
BASOS ABS: 0.1 10*3/uL (ref 0.0–0.1)
EOS%: 3.1 % (ref 0.0–7.0)
Eosinophils Absolute: 0.2 10*3/uL (ref 0.0–0.5)
HEMATOCRIT: 42.5 % (ref 38.4–49.9)
HEMOGLOBIN: 13.7 g/dL (ref 13.0–17.1)
LYMPH#: 0.9 10*3/uL (ref 0.9–3.3)
LYMPH%: 13.2 % — ABNORMAL LOW (ref 14.0–49.0)
MCH: 29.6 pg (ref 27.2–33.4)
MCHC: 32.3 g/dL (ref 32.0–36.0)
MCV: 91.6 fL (ref 79.3–98.0)
MONO#: 0.8 10*3/uL (ref 0.1–0.9)
MONO%: 12.1 % (ref 0.0–14.0)
NEUT#: 4.9 10*3/uL (ref 1.5–6.5)
NEUT%: 70.5 % (ref 39.0–75.0)
PLATELETS: 459 10*3/uL — AB (ref 140–400)
RBC: 4.64 10*6/uL (ref 4.20–5.82)
RDW: 16 % — AB (ref 11.0–14.6)
WBC: 6.9 10*3/uL (ref 4.0–10.3)

## 2015-03-12 NOTE — Telephone Encounter (Signed)
per pof to sch pt appt-gave pt copy of avs-adv [facility] will call to sch trmt °

## 2015-03-12 NOTE — Progress Notes (Signed)
[place] [phone]   [phone]  OFFICE PROGRESS NOTE  No PCP Per Patient No address on file  PRINCIPAL DIAGNOSIS: 1) stage IIIA (T2a, N2, M0) non-small cell lung cancer, squamous cell carcinoma diagnosed in August 2016 2)  Stage IIIA (T2 N2 Mx) non-small cell lung cancer, adenocarcinoma, involving the right upper lobe and mediastinal lymphadenopathy diagnosed in June 2010.   PRIOR THERAPY:  1. Status post concurrent chemoradiation with weekly carboplatin and paclitaxel; last dose was given April 08, 2008. 2. Status post 3 cycles of consolidation chemotherapy with docetaxel; last dose was given on Jun 26, 2008. Concurrent chemoradiation with weekly carboplatin for AUC of 2 and paclitaxel 45 MG/M2. First dose expected on 09/23/2014 status post 7 cycles. Last dose was given 12/01/2014 with partial response.  CURRENT THERAPY: Consolidation chemotherapy with carboplatin for AUC of 5 and paclitaxel 175 MG/M2 every 3 weeks with Neulasta support. First dose 01/02/2015. Status post 3 cycles.  INTERVAL HISTORY: Evan [name] 65 y.o. male returns to the clinic today for followup visit. The patient recently completed consolidation chemotherapy with carboplatin and paclitaxel status post 3 cycles and tolerating his treatment fairly well. He is feeling fine with no complaints except for occasional left parasternal chest pain probably secondary to his recent radiation. He denied having any cough, shortness of breath or hemoptysis. The patient denied having any significant weight loss or night sweats. He has no nausea or vomiting. He denied having any significant peripheral neuropathy. He was supposed to have repeat CT scan of the chest before this visit but unfortunately it is a scheduled for 03/14/2015.  MEDICAL HISTORY: Past Medical History  Diagnosis Date  . Complication of anesthesia     " sometimes I wake up during surgery "  . Anxiety   . Depression    . Lung cancer ([place])     non small cell lung cancer    ALLERGIES:  has No Known Allergies.  MEDICATIONS:  Current Outpatient Prescriptions  Medication Sig Dispense Refill  . acetaminophen (TYLENOL) 500 MG tablet Take 500 mg by mouth every 6 (six) hours as needed.    [name] albuterol (PROVENTIL HFA;VENTOLIN HFA) 108 (90 BASE) MCG/ACT inhaler Inhale 2 puffs into the lungs every 6 (six) hours as needed for wheezing or shortness of breath. 1 Inhaler 2  . diphenhydramine-acetaminophen (TYLENOL PM) 25-500 MG TABS Take 1 tablet by mouth at bedtime as needed.    [name] HYDROcodone-acetaminophen (NORCO/VICODIN) 5-325 MG per tablet Take 1 tablet by mouth every 6 (six) hours as needed for moderate pain. 30 tablet 0  . ibuprofen (ADVIL,MOTRIN) 200 MG tablet Take 200 mg by mouth every 6 (six) hours as needed.    [name] OVER THE COUNTER MEDICATION Take 480 mLs by mouth daily. Joint juice    . prochlorperazine (COMPAZINE) 10 MG tablet Take 1 tablet (10 mg total) by mouth every 6 (six) hours as needed for nausea or vomiting. 30 tablet 0  . SPIRIVA RESPIMAT 2.5 MCG/ACT AERS Inhale 2 puffs into the lungs daily. 1 Inhaler 5   No current facility-administered medications for this visit.    SURGICAL HISTORY:  Past Surgical History  Procedure Laterality Date  . Foot amputation Right     traumatic right lower extremity  . Amputation Right 05/21/2012    Procedure: AMPUTATION BELOW KNEE;  Surgeon: Newt Minion, MD;  Location: [facility];  Service: Orthopedics;  Laterality: Right;  . Upper jaw  SURGERY FOR INFECTION  . Colonoscopy w/ biopsies and polypectomy    . Amputation Right 06/21/2012    Procedure: Revision AMPUTATION BELOW KNEE Right;  Surgeon: Newt Minion, MD;  Location: [place];  Service: Orthopedics;  Laterality: Right;  Revision right Below Knee Amputation    REVIEW OF SYSTEMS:  A comprehensive review of systems was negative except for: Constitutional: positive for fatigue Respiratory: positive for  pleurisy/chest pain   PHYSICAL EXAMINATION: General appearance: alert, cooperative, fatigued and no distress Head: Normocephalic, without obvious abnormality, atraumatic Neck: no adenopathy Lymph nodes: Cervical, supraclavicular, and axillary nodes normal. Resp: clear to auscultation bilaterally Back: symmetric, no curvature. ROM normal. No CVA tenderness. Cardio: regular rate and rhythm, S1, S2 normal, no murmur, click, rub or gallop GI: soft, non-tender; bowel sounds normal; no masses,  no organomegaly Extremities: Right below knee amputation Neurologic: Alert and oriented X 3, normal strength and tone. Normal symmetric reflexes. Normal coordination and gait  ECOG PERFORMANCE STATUS: 1 - Symptomatic but completely ambulatory  Blood pressure 123/90, pulse 100, temperature 98.3 F (36.8 C), temperature source Oral, resp. rate 18, height '5\' 5"'$  (1.651 m), weight 122 lb 8 oz (55.566 kg), SpO2 100 %.  LABORATORY DATA: Lab Results  Component Value Date   WBC 6.9 03/12/2015   HGB 13.7 03/12/2015   HCT 42.5 03/12/2015   MCV 91.6 03/12/2015   PLT 459* 03/12/2015      Chemistry      Component Value Date/Time   NA 138 03/06/2015 1352   NA 138 07/24/2013 1454   NA 139 07/20/2011 1312   K 3.9 03/06/2015 1352   K 4.4 07/24/2013 1454   K 4.8* 07/20/2011 1312   CL 101 07/24/2013 1454   CL 104 07/19/2012 1432   CL 102 07/20/2011 1312   CO2 22 03/06/2015 1352   CO2 23 07/24/2013 1454   CO2 28 07/20/2011 1312   BUN 10.3 03/06/2015 1352   BUN 11 07/24/2013 1454   BUN 9 07/20/2011 1312   CREATININE 0.9 03/06/2015 1352   CREATININE 0.86 07/24/2013 1454   CREATININE 1.3* 07/20/2011 1312      Component Value Date/Time   CALCIUM 9.3 03/06/2015 1352   CALCIUM 9.4 07/24/2013 1454   CALCIUM 8.9 07/20/2011 1312   ALKPHOS 164* 03/06/2015 1352   ALKPHOS 113 07/24/2013 1454   ALKPHOS 88* 07/20/2011 1312   AST 15 03/06/2015 1352   AST 14 07/24/2013 1454   AST 21 07/20/2011 1312   ALT 12  03/06/2015 1352   ALT 14 07/24/2013 1454   ALT 27 07/20/2011 1312   BILITOT <0.30 03/06/2015 1352   BILITOT 0.4 07/24/2013 1454   BILITOT 0.60 07/20/2011 1312       RADIOGRAPHIC STUDIES: No results found.  ASSESSMENT AND PLAN: This is a very pleasant 65 years old African American male with history of stage IIIa non-small cell lung cancer status post concurrent chemoradiation followed by 3 cycles of consolidation chemotherapy and has been observation since May of 2010 with no evidence for disease progression. The patient completed a course of concurrent chemoradiation for recurrent non-small cell lung cancer, squamous cell carcinoma involving the left upper lobe and mediastinal lymphadenopathy. He is status post 7 cycles with partial response, followed by consolidation chemotherapy with carboplatin and paclitaxel status post 3 cycles. He is scheduled to have repeat CT scan of the chest in 2 days for restaging of his disease. If there is no evidence for disease progression on the upcoming scan, he will  continue on observation and I will see him back for follow-up visit in 3 months with repeat CT scan of the chest. If the scan showed any concerning findings, I will call the patient for follow-up appointment sooner for discussion of his treatment options. The patient was advised to call immediately if he has any concerning symptoms in the interval. He was advised to call immediately if she has any concerning symptoms in the interval.  All questions were answered. The patient knows to call the clinic with any problems, questions or concerns. We can certainly see the patient much sooner if necessary.  Disclaimer: This note was dictated with voice recognition software. Similar sounding words can inadvertently be transcribed and may not be corrected upon review.

## 2015-03-14 ENCOUNTER — Ambulatory Visit (HOSPITAL_COMMUNITY)
Admission: RE | Admit: 2015-03-14 | Discharge: 2015-03-14 | Disposition: A | Discharge status: Home / Self Care | Payer: Medicare Other | Source: Ambulatory Visit | Attending: Internal Medicine | Admitting: Internal Medicine

## 2015-03-14 DIAGNOSIS — R918 Other nonspecific abnormal finding of lung field: Secondary | ICD-10-CM | POA: Diagnosis not present

## 2015-03-14 DIAGNOSIS — C3412 Malignant neoplasm of upper lobe, left bronchus or lung: Secondary | ICD-10-CM | POA: Insufficient documentation

## 2015-03-14 DIAGNOSIS — R079 Chest pain, unspecified: Secondary | ICD-10-CM | POA: Insufficient documentation

## 2015-03-14 MED ORDER — IOHEXOL 300 MG/ML  SOLN
75.0000 mL | Freq: Once | INTRAMUSCULAR | Status: AC | PRN
  Administered 2015-03-14: 75 mL via INTRAVENOUS

## 2015-05-19 ENCOUNTER — Encounter: Payer: Self-pay | Admitting: *Deleted

## 2015-05-28 ENCOUNTER — Encounter: Payer: Self-pay | Admitting: Pulmonary Disease

## 2015-05-28 ENCOUNTER — Ambulatory Visit (INDEPENDENT_AMBULATORY_CARE_PROVIDER_SITE_OTHER): Payer: Medicare Other | Admitting: Pulmonary Disease

## 2015-05-28 VITALS — BP 124/74 | HR 70 | Ht 65.5 in | Wt 127.2 lb

## 2015-05-28 DIAGNOSIS — J449 Chronic obstructive pulmonary disease, unspecified: Secondary | ICD-10-CM

## 2015-05-28 DIAGNOSIS — C3412 Malignant neoplasm of upper lobe, left bronchus or lung: Secondary | ICD-10-CM | POA: Diagnosis not present

## 2015-05-28 NOTE — Patient Instructions (Addendum)
Trial of STIOLTO instead of Spiriva Use albuterol MDI 2 puffs every 6 hours as needed for rescue
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
## 2015-05-28 NOTE — Assessment & Plan Note (Signed)
Trial of STIOLTO instead of Spiriva Use albuterol MDI 2 puffs every 6 hours as needed for rescue

## 2015-05-29 NOTE — Assessment & Plan Note (Signed)
Surveillance CT per oncology

## 2015-06-10 ENCOUNTER — Other Ambulatory Visit (HOSPITAL_BASED_OUTPATIENT_CLINIC_OR_DEPARTMENT_OTHER): Payer: Medicare Other

## 2015-06-10 ENCOUNTER — Encounter (HOSPITAL_COMMUNITY): Payer: Self-pay

## 2015-06-10 ENCOUNTER — Ambulatory Visit (HOSPITAL_COMMUNITY)
Admission: RE | Admit: 2015-06-10 | Discharge: 2015-06-10 | Disposition: A | Discharge status: Home / Self Care | Payer: Medicare Other | Source: Ambulatory Visit | Attending: Internal Medicine | Admitting: Internal Medicine

## 2015-06-10 DIAGNOSIS — J948 Other specified pleural conditions: Secondary | ICD-10-CM | POA: Diagnosis not present

## 2015-06-10 DIAGNOSIS — R911 Solitary pulmonary nodule: Secondary | ICD-10-CM | POA: Diagnosis not present

## 2015-06-10 DIAGNOSIS — C3411 Malignant neoplasm of upper lobe, right bronchus or lung: Secondary | ICD-10-CM

## 2015-06-10 DIAGNOSIS — C3412 Malignant neoplasm of upper lobe, left bronchus or lung: Secondary | ICD-10-CM | POA: Diagnosis not present

## 2015-06-10 LAB — CBC WITH DIFFERENTIAL/PLATELET
BASO%: 1 % (ref 0.0–2.0)
BASOS ABS: 0 10*3/uL (ref 0.0–0.1)
EOS%: 6.5 % (ref 0.0–7.0)
Eosinophils Absolute: 0.3 10*3/uL (ref 0.0–0.5)
HEMATOCRIT: 44.8 % (ref 38.4–49.9)
HGB: 14.2 g/dL (ref 13.0–17.1)
LYMPH#: 0.9 10*3/uL (ref 0.9–3.3)
LYMPH%: 18.6 % (ref 14.0–49.0)
MCH: 28.2 pg (ref 27.2–33.4)
MCHC: 31.6 g/dL — AB (ref 32.0–36.0)
MCV: 89 fL (ref 79.3–98.0)
MONO#: 0.5 10*3/uL (ref 0.1–0.9)
MONO%: 9.6 % (ref 0.0–14.0)
NEUT#: 3 10*3/uL (ref 1.5–6.5)
NEUT%: 64.3 % (ref 39.0–75.0)
Platelets: 386 10*3/uL (ref 140–400)
RBC: 5.04 10*6/uL (ref 4.20–5.82)
RDW: 13.7 % (ref 11.0–14.6)
WBC: 4.7 10*3/uL (ref 4.0–10.3)

## 2015-06-10 LAB — COMPREHENSIVE METABOLIC PANEL
ALT: 13 U/L (ref 0–55)
ANION GAP: 7 meq/L (ref 3–11)
AST: 15 U/L (ref 5–34)
Albumin: 3.6 g/dL (ref 3.5–5.0)
Alkaline Phosphatase: 95 U/L (ref 40–150)
BUN: 14 mg/dL (ref 7.0–26.0)
CALCIUM: 9.1 mg/dL (ref 8.4–10.4)
CHLORIDE: 108 meq/L (ref 98–109)
CO2: 25 mEq/L (ref 22–29)
Creatinine: 0.8 mg/dL (ref 0.7–1.3)
Glucose: 79 mg/dl (ref 70–140)
POTASSIUM: 4 meq/L (ref 3.5–5.1)
Sodium: 139 mEq/L (ref 136–145)
Total Bilirubin: <0.3 mg/dL (ref 0.20–1.20)
Total Protein: 7.2 g/dL (ref 6.4–8.3)

## 2015-06-10 MED ORDER — IOPAMIDOL (ISOVUE-300) INJECTION 61%
75.0000 mL | Freq: Once | INTRAVENOUS | Status: AC | PRN
  Administered 2015-06-10: 75 mL via INTRAVENOUS

## 2015-06-17 ENCOUNTER — Telehealth: Payer: Self-pay | Admitting: Internal Medicine

## 2015-06-17 ENCOUNTER — Encounter: Payer: Self-pay | Admitting: Internal Medicine

## 2015-06-17 ENCOUNTER — Ambulatory Visit (HOSPITAL_BASED_OUTPATIENT_CLINIC_OR_DEPARTMENT_OTHER): Payer: Medicare Other | Admitting: Internal Medicine

## 2015-06-17 VITALS — BP 119/82 | HR 82 | Temp 98.4°F | Resp 18 | Ht 65.5 in | Wt 127.8 lb

## 2015-06-17 DIAGNOSIS — C3412 Malignant neoplasm of upper lobe, left bronchus or lung: Secondary | ICD-10-CM | POA: Diagnosis not present

## 2015-06-17 NOTE — Progress Notes (Signed)
[facility] [phone]   [phone]) [phone]  OFFICE PROGRESS NOTE  No PCP Per Patient No address on file  PRINCIPAL DIAGNOSIS: 1) stage IIIA (T2a, N2, M0) non-small cell lung cancer, squamous cell carcinoma of the left upper lobe diagnosed in August 2016 2)  Stage IIIA (T2 N2 Mx) non-small cell lung cancer, adenocarcinoma, involving the right upper lobe and mediastinal lymphadenopathy diagnosed in June 2010.   PRIOR THERAPY:  1. Status post concurrent chemoradiation with weekly carboplatin and paclitaxel; last dose was given April 08, 2008. 2. Status post 3 cycles of consolidation chemotherapy with docetaxel; last dose was given on Jun 26, 2008. 3. Concurrent chemoradiation with weekly carboplatin for AUC of 2 and paclitaxel 45 MG/M2. First dose expected on 09/23/2014 status post 7 cycles. Last dose was given 12/01/2014 with partial response. 4. Consolidation chemotherapy with carboplatin for AUC of 5 and paclitaxel 175 MG/M2 every 3 weeks with Neulasta support. First dose 01/02/2015. Status post 3 cycles.  CURRENT THERAPY: Observation.  INTERVAL HISTORY: Evan [name] 65 y.o. male returns to the clinic today for followup visit. The patient recently completed consolidation chemotherapy with carboplatin and paclitaxel status post 3 cycles and tolerating his treatment fairly well. He is feeling fine with no complaints except for occasional left parasternal chest pain probably secondary to his recent radiation. He denied having any cough, shortness of breath or hemoptysis. The patient denied having any significant weight loss or night sweats. He has no nausea or vomiting. He denied having any significant peripheral neuropathy. He was supposed to have repeat CT scan of the chest before this visit but unfortunately it is a scheduled for 03/14/2015.  MEDICAL HISTORY: Past Medical History  Diagnosis Date  . Complication of anesthesia     " sometimes I wake up  during surgery "  . Anxiety   . Depression   . Lung cancer ([place])     non small cell lung cancer    ALLERGIES:  has No Known Allergies.  MEDICATIONS:  Current Outpatient Prescriptions  Medication Sig Dispense Refill  . acetaminophen (TYLENOL) 500 MG tablet Take 500 mg by mouth every 6 (six) hours as needed.    [name] albuterol (PROVENTIL HFA;VENTOLIN HFA) 108 (90 BASE) MCG/ACT inhaler Inhale 2 puffs into the lungs every 6 (six) hours as needed for wheezing or shortness of breath. 1 Inhaler 2  . diphenhydramine-acetaminophen (TYLENOL PM) 25-500 MG TABS Take 1 tablet by mouth at bedtime as needed.    [name] HYDROcodone-acetaminophen (NORCO/VICODIN) 5-325 MG per tablet Take 1 tablet by mouth every 6 (six) hours as needed for moderate pain. 30 tablet 0  . ibuprofen (ADVIL,MOTRIN) 200 MG tablet Take 200 mg by mouth every 6 (six) hours as needed.    . Nutritional Supplements (CARNATION BREAKFAST ESSENTIALS PO) Take by mouth.    . prochlorperazine (COMPAZINE) 10 MG tablet Take 1 tablet (10 mg total) by mouth every 6 (six) hours as needed for nausea or vomiting. 30 tablet 0  . SPIRIVA RESPIMAT 2.5 MCG/ACT AERS Inhale 2 puffs into the lungs daily. 1 Inhaler 5   No current facility-administered medications for this visit.    SURGICAL HISTORY:  Past Surgical History  Procedure Laterality Date  . Foot amputation Right     traumatic right lower extremity  . Amputation Right 05/21/2012    Procedure: AMPUTATION BELOW KNEE;  Surgeon: Newt Minion, MD;  Location: [place];  Service: Orthopedics;  Laterality: Right;  . Upper jaw  SURGERY FOR INFECTION  . Colonoscopy w/ biopsies and polypectomy    . Amputation Right 06/21/2012    Procedure: Revision AMPUTATION BELOW KNEE Right;  Surgeon: Newt Minion, MD;  Location: [facility];  Service: Orthopedics;  Laterality: Right;  Revision right Below Knee Amputation    REVIEW OF SYSTEMS:  A comprehensive review of systems was negative except for: Constitutional: positive  for fatigue Respiratory: positive for pleurisy/chest pain   PHYSICAL EXAMINATION: General appearance: alert, cooperative, fatigued and no distress Head: Normocephalic, without obvious abnormality, atraumatic Neck: no adenopathy Lymph nodes: Cervical, supraclavicular, and axillary nodes normal. Resp: clear to auscultation bilaterally Back: symmetric, no curvature. ROM normal. No CVA tenderness. Cardio: regular rate and rhythm, S1, S2 normal, no murmur, click, rub or gallop GI: soft, non-tender; bowel sounds normal; no masses,  no organomegaly Extremities: Right below knee amputation Neurologic: Alert and oriented X 3, normal strength and tone. Normal symmetric reflexes. Normal coordination and gait  ECOG PERFORMANCE STATUS: 1 - Symptomatic but completely ambulatory  Blood pressure 119/82, pulse 82, temperature 98.4 F (36.9 C), temperature source Oral, resp. rate 18, height 5' 5.5" (1.664 m), weight 127 lb 12.8 oz (57.97 kg), SpO2 100 %.  LABORATORY DATA: Lab Results  Component Value Date   WBC 4.7 06/10/2015   HGB 14.2 06/10/2015   HCT 44.8 06/10/2015   MCV 89.0 06/10/2015   PLT 386 06/10/2015      Chemistry      Component Value Date/Time   NA 139 06/10/2015 1151   NA 138 07/24/2013 1454   NA 139 07/20/2011 1312   K 4.0 06/10/2015 1151   K 4.4 07/24/2013 1454   K 4.8* 07/20/2011 1312   CL 101 07/24/2013 1454   CL 104 07/19/2012 1432   CL 102 07/20/2011 1312   CO2 25 06/10/2015 1151   CO2 23 07/24/2013 1454   CO2 28 07/20/2011 1312   BUN 14.0 06/10/2015 1151   BUN 11 07/24/2013 1454   BUN 9 07/20/2011 1312   CREATININE 0.8 06/10/2015 1151   CREATININE 0.86 07/24/2013 1454   CREATININE 1.3* 07/20/2011 1312      Component Value Date/Time   CALCIUM 9.1 06/10/2015 1151   CALCIUM 9.4 07/24/2013 1454   CALCIUM 8.9 07/20/2011 1312   ALKPHOS 95 06/10/2015 1151   ALKPHOS 113 07/24/2013 1454   ALKPHOS 88* 07/20/2011 1312   AST 15 06/10/2015 1151   AST 14 07/24/2013  1454   AST 21 07/20/2011 1312   ALT 13 06/10/2015 1151   ALT 14 07/24/2013 1454   ALT 27 07/20/2011 1312   BILITOT <0.30 06/10/2015 1151   BILITOT 0.4 07/24/2013 1454   BILITOT 0.60 07/20/2011 1312       RADIOGRAPHIC STUDIES: Ct Chest W Contrast  06/10/2015  CLINICAL DATA:  Lung cancer. Chemotherapy radiation therapy complete. EXAM: CT CHEST WITH CONTRAST TECHNIQUE: Multidetector CT imaging of the chest was performed during intravenous contrast administration. CONTRAST:  20m ISOVUE-300 IOPAMIDOL (ISOVUE-300) INJECTION 61% COMPARISON:  CT 03/13/2005, PET-CT 08/16/2014. FINDINGS: Mediastinum/Nodes: No axillary supraclavicular adenopathy. Mediastinal adenopathy. Lungs/Pleura: There is bilateral perihilar linear consolidation with bronchiectasis not changed in pattern from comparison exam and consists with radiation change in the lungs. Nodular component in the LEFT upper lobe along the mediastinum measures 16 mm (image 42, series 60) and unchanged. Upper abdomen: Adrenal glands are normal. Kidneys are normal. Liver is normal. Musculoskeletal: No aggressive osseous lesion. IMPRESSION: 1. Stable perihilar postradiation change in the lungs. 2. Stable medial LEFT upper lobe  nodule lesion along the mediastinal pleural surface. This was hypermetabolic on comparison PET-CT scan 3. No evidence of lung cancer recurrence. Electronically Signed   By: Suzy Bouchard M.D.   On: 06/10/2015 15:54    ASSESSMENT AND PLAN: This is a very pleasant 66 years old [place] American male with history of stage IIIA non-small cell lung cancer of the right upper lobe status post concurrent chemoradiation followed by 3 cycles of consolidation chemotherapy and has been observation since May of 2010 with no evidence for disease progression. The patient completed a course of concurrent chemoradiation for recurrent non-small cell lung cancer, squamous cell carcinoma involving the left upper lobe and mediastinal lymphadenopathy. He  is status 3 cycles of consolidation chemotherapy. He is currently on observation and has no evidence for disease recurrence on the recent CT scan of the chest. I discussed the scan results with the patient today. I recommended for him to continue on observation with repeat CT scan of the chest in 4 months. The patient was advised to call immediately if he has any concerning symptoms in the interval. He was advised to call immediately if she has any concerning symptoms in the interval.  All questions were answered. The patient knows to call the clinic with any problems, questions or concerns. We can certainly see the patient much sooner if necessary.  Disclaimer: This note was dictated with voice recognition software. Similar sounding words can inadvertently be transcribed and may not be corrected upon review.

## 2015-06-17 NOTE — Telephone Encounter (Signed)
Gave pt apt & avs °

## 2015-06-25 ENCOUNTER — Encounter: Payer: Self-pay | Admitting: Internal Medicine

## 2015-06-25 NOTE — Progress Notes (Signed)
forms left in box 06/23/15

## 2015-06-26 ENCOUNTER — Encounter: Payer: Self-pay | Admitting: Internal Medicine

## 2015-06-26 NOTE — Progress Notes (Signed)
forms left in box 06/23/15- left for dr. Julien Nordmann to sign

## 2015-06-27 ENCOUNTER — Encounter: Payer: Self-pay | Admitting: Internal Medicine

## 2015-06-27 NOTE — Progress Notes (Signed)
forms left in box 06/23/15- left for dr. Julien Nordmann to sign-faxed [phone] and sent copy to patient and medical records

## 2015-07-06 ENCOUNTER — Emergency Department (HOSPITAL_COMMUNITY): Payer: Medicare Other

## 2015-07-06 ENCOUNTER — Encounter (HOSPITAL_COMMUNITY): Payer: Self-pay | Admitting: Emergency Medicine

## 2015-07-06 ENCOUNTER — Emergency Department (HOSPITAL_COMMUNITY)
Admission: EM | Admit: 2015-07-06 | Discharge: 2015-07-07 | Disposition: A | Discharge status: Home / Self Care | Payer: Medicare Other | Attending: Emergency Medicine | Admitting: Emergency Medicine

## 2015-07-06 DIAGNOSIS — S161XXA Strain of muscle, fascia and tendon at neck level, initial encounter: Secondary | ICD-10-CM | POA: Diagnosis not present

## 2015-07-06 DIAGNOSIS — Z87891 Personal history of nicotine dependence: Secondary | ICD-10-CM | POA: Diagnosis not present

## 2015-07-06 DIAGNOSIS — Y9389 Activity, other specified: Secondary | ICD-10-CM | POA: Insufficient documentation

## 2015-07-06 DIAGNOSIS — S199XXA Unspecified injury of neck, initial encounter: Secondary | ICD-10-CM | POA: Diagnosis present

## 2015-07-06 DIAGNOSIS — Z85118 Personal history of other malignant neoplasm of bronchus and lung: Secondary | ICD-10-CM | POA: Insufficient documentation

## 2015-07-06 DIAGNOSIS — J449 Chronic obstructive pulmonary disease, unspecified: Secondary | ICD-10-CM | POA: Insufficient documentation

## 2015-07-06 DIAGNOSIS — F419 Anxiety disorder, unspecified: Secondary | ICD-10-CM | POA: Diagnosis not present

## 2015-07-06 DIAGNOSIS — S0990XA Unspecified injury of head, initial encounter: Secondary | ICD-10-CM | POA: Diagnosis not present

## 2015-07-06 DIAGNOSIS — T148 Other injury of unspecified body region: Secondary | ICD-10-CM | POA: Diagnosis not present

## 2015-07-06 DIAGNOSIS — R51 Headache: Secondary | ICD-10-CM | POA: Diagnosis not present

## 2015-07-06 DIAGNOSIS — Y92410 Unspecified street and highway as the place of occurrence of the external cause: Secondary | ICD-10-CM | POA: Diagnosis not present

## 2015-07-06 DIAGNOSIS — R079 Chest pain, unspecified: Secondary | ICD-10-CM | POA: Diagnosis not present

## 2015-07-06 DIAGNOSIS — S299XXA Unspecified injury of thorax, initial encounter: Secondary | ICD-10-CM | POA: Diagnosis not present

## 2015-07-06 DIAGNOSIS — Z79899 Other long term (current) drug therapy: Secondary | ICD-10-CM | POA: Insufficient documentation

## 2015-07-06 DIAGNOSIS — S29001A Unspecified injury of muscle and tendon of front wall of thorax, initial encounter: Secondary | ICD-10-CM | POA: Diagnosis not present

## 2015-07-06 DIAGNOSIS — Y998 Other external cause status: Secondary | ICD-10-CM | POA: Diagnosis not present

## 2015-07-06 DIAGNOSIS — M542 Cervicalgia: Secondary | ICD-10-CM | POA: Diagnosis not present

## 2015-07-06 MED ORDER — HYDROCODONE-ACETAMINOPHEN 5-325 MG PO TABS
2.0000 | ORAL_TABLET | Freq: Once | ORAL | Status: AC
  Administered 2015-07-06: 2 via ORAL
  Filled 2015-07-06: qty 2
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
## 2015-07-06 NOTE — ED Notes (Signed)
EDP at bedside  
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
## 2015-07-07 NOTE — ED Notes (Signed)
C-Collar removed

## 2015-07-07 NOTE — Discharge Instructions (Signed)
If you were given medicines take as directed.  If you are on coumadin or contraceptives realize their levels and effectiveness is altered by many different medicines.  If you have any reaction (rash, tongues swelling, other) to the medicines stop taking and see a physician.   Take tylenol, motrin and use ice for pain. If your blood pressure was elevated in the ER make sure you follow up for management with a primary doctor or return for chest pain, shortness of breath or stroke symptoms.  Please follow up as directed and return to the ER or see a physician for new or worsening symptoms.  Thank you. Filed Vitals:   07/06/15 2200 07/06/15 2230 07/06/15 2300 07/06/15 2345  BP: 182/124 159/106 143/107 144/93  Pulse: 102 93 92 95  Temp:      Resp: '22 21 20 28  '$ Height:      Weight:      SpO2: 96% 97% 96% 96%

## 2015-09-29 ENCOUNTER — Ambulatory Visit: Payer: Medicare Other | Admitting: Adult Health

## 2015-10-02 ENCOUNTER — Ambulatory Visit (INDEPENDENT_AMBULATORY_CARE_PROVIDER_SITE_OTHER): Payer: Medicare Other | Admitting: Adult Health

## 2015-10-02 ENCOUNTER — Encounter: Payer: Self-pay | Admitting: Adult Health

## 2015-10-02 DIAGNOSIS — Z23 Encounter for immunization: Secondary | ICD-10-CM | POA: Diagnosis not present

## 2015-10-02 DIAGNOSIS — J439 Emphysema, unspecified: Secondary | ICD-10-CM

## 2015-10-02 MED ORDER — TIOTROPIUM BROMIDE-OLODATEROL 2.5-2.5 MCG/ACT IN AERS: 2.0000 | INHALATION_SPRAY | Freq: Every day | RESPIRATORY_TRACT | 5 refills | Status: DC

## 2015-10-02 NOTE — Assessment & Plan Note (Signed)
Compensated on present regimen  Smoking cessation   Plan  Patient Instructions  Flu shot today .  Continue on current regimen .  Follow up for CT next month .  follow up Dr. Elsworth Soho  In 4 months and As needed

## 2015-10-02 NOTE — Addendum Note (Signed)
Addended by: Osa Craver on: 10/02/2015 03:36 PM   Modules accepted: Orders

## 2015-10-02 NOTE — Patient Instructions (Signed)
Flu shot today .  Continue on current regimen .  Follow up for CT next month .  follow up Dr. Elsworth Soho  In 4 months and As needed

## 2015-10-02 NOTE — Progress Notes (Signed)
Subjective:    Patient ID: Evan [name], male    DOB: [birth year], 65 y.o.   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 S/p concurrent chemotherapy with carboplatin and paclitaxel.  And XRT   TESTS: PFT 02/08/08 >> FEV1 1.87 (68%), FEV1% 67, TLC 5.6 (88%), DLCO 91%, +BD   10/02/2015 Follow up : COPD and Lung Cancer  Returns for  4 month follow up .  Pt states breathing is up and down. Has good and bad days. Has intermittent wheezing /prod cough.  . Following with Dr. Julien Nordmann for Lung cancer.  Last CT chest 06/2015 showed stable lung nodule on LUL . Post radiation changes.  Has CT next month planned .  Denies any hemoptysis, orthopnea, PND, or increased leg swelling. Occasional cig , encouarged on cesstation .  PVX is utd.    Past Medical History:  Diagnosis Date  . Anxiety   . Complication of anesthesia    " sometimes I wake up during surgery "  . Depression   . Lung cancer ([place])    non small cell lung cancer   Current Outpatient Prescriptions on File Prior to Visit  Medication Sig Dispense Refill  . albuterol (PROVENTIL HFA;VENTOLIN HFA) 108 (90 BASE) MCG/ACT inhaler Inhale 2 puffs into the lungs every 6 (six) hours as needed for wheezing or shortness of breath. 1 Inhaler 2  . ibuprofen (ADVIL,MOTRIN) 200 MG tablet Take 600 mg by mouth every 6 (six) hours as needed (pain).     . Ibuprofen-Diphenhydramine Cit (IBUPROFEN PM PO) Take 1 tablet by mouth at bedtime as needed (pain/sleep).    . Multiple Vitamin (MULTIVITAMIN WITH MINERALS) TABS tablet Take 1 tablet by mouth daily.    . Nutritional Supplements (CARNATION BREAKFAST ESSENTIALS PO) Take 1 packet by mouth daily.     [name] HYDROcodone-acetaminophen (NORCO/VICODIN) 5-325 MG per tablet Take 1 tablet by mouth every 6 (six) hours as needed for moderate pain. (Patient not taking: Reported on 10/02/2015) 30 tablet 0  . prochlorperazine (COMPAZINE)  10 MG tablet Take 1 tablet (10 mg total) by mouth every 6 (six) hours as needed for nausea or vomiting. (Patient not taking: Reported on 07/06/2015) 30 tablet 0  . SPIRIVA RESPIMAT 2.5 MCG/ACT AERS Inhale 2 puffs into the lungs daily. (Patient not taking: Reported on 10/02/2015) 1 Inhaler 5   No current facility-administered medications on file prior to visit.        Review of Systems Constitutional:   No  weight loss, night sweats,  Fevers, chills, fatigue, or  lassitude.  HEENT:   No headaches,  Difficulty swallowing,  Tooth/dental problems, or  Sore throat,                No sneezing, itching, ear ache, nasal congestion, post nasal drip,   CV:  No chest pain,  Orthopnea, PND, swelling in lower extremities, anasarca, dizziness, palpitations, syncope.   GI  No heartburn, indigestion, abdominal pain, nausea, vomiting, diarrhea, change in bowel habits, loss of appetite, bloody stools.   Resp:   No chest wall deformity  Skin: no rash or lesions.  GU: no dysuria, change in color of urine, no urgency or frequency.  No flank pain, no hematuria   MS:  No joint pain or swelling.  No decreased range of motion.  No back pain.  Psych:  No change in mood or affect. No depression or anxiety.  No memory loss.         Objective:   Physical Exam Vitals:   10/02/15 1512  BP: 122/70  Pulse: 83  Temp: 98 F (36.7 C)  TempSrc: Oral  SpO2: 98%  Weight: 126 lb (57.2 kg)  Height: 5' 5.5" (1.664 m)     GEN: A/Ox3; pleasant , NAD,    HEENT:  [place]/AT,  EACs-clear, TMs-wnl, NOSE-clear, THROAT-clear, no lesions, no postnasal drip or exudate noted.   NECK:  Supple w/ fair ROM; no JVD; normal carotid impulses w/o bruits; no thyromegaly or nodules palpated; no lymphadenopathy.    RESP  Decreased BS in bases w/o, wheezes/ rales/ or rhonchi. no accessory muscle use, no dullness to percussion  CARD:  RRR, no m/r/g  , no peripheral edema, pulses intact, no cyanosis or clubbing.  GI:   Soft & nt;  nml bowel sounds; no organomegaly or masses detected.   Musco: Warm bil,  Right LE prosthesis   Neuro: alert, no focal deficits noted.    Skin: Warm, no lesions or rashes        Ferd Horrigan NP-C  [facility]  10/02/2015

## 2015-10-09 ENCOUNTER — Ambulatory Visit (HOSPITAL_COMMUNITY)
Admission: RE | Admit: 2015-10-09 | Discharge: 2015-10-09 | Disposition: A | Discharge status: Home / Self Care | Payer: Medicare Other | Source: Ambulatory Visit | Attending: Internal Medicine | Admitting: Internal Medicine

## 2015-10-09 ENCOUNTER — Other Ambulatory Visit (HOSPITAL_BASED_OUTPATIENT_CLINIC_OR_DEPARTMENT_OTHER): Payer: Medicare Other

## 2015-10-09 ENCOUNTER — Encounter (HOSPITAL_COMMUNITY): Payer: Self-pay

## 2015-10-09 DIAGNOSIS — C3412 Malignant neoplasm of upper lobe, left bronchus or lung: Secondary | ICD-10-CM | POA: Insufficient documentation

## 2015-10-09 DIAGNOSIS — C3492 Malignant neoplasm of unspecified part of left bronchus or lung: Secondary | ICD-10-CM

## 2015-10-09 LAB — CBC WITH DIFFERENTIAL/PLATELET
BASO%: 1.3 % (ref 0.0–2.0)
BASOS ABS: 0.1 10*3/uL (ref 0.0–0.1)
EOS ABS: 0.3 10*3/uL (ref 0.0–0.5)
EOS%: 8.3 % — ABNORMAL HIGH (ref 0.0–7.0)
HCT: 45.4 % (ref 38.4–49.9)
HEMOGLOBIN: 14.5 g/dL (ref 13.0–17.1)
LYMPH%: 26 % (ref 14.0–49.0)
MCH: 28.2 pg (ref 27.2–33.4)
MCHC: 31.9 g/dL — ABNORMAL LOW (ref 32.0–36.0)
MCV: 88.5 fL (ref 79.3–98.0)
MONO#: 0.3 10*3/uL (ref 0.1–0.9)
MONO%: 7.9 % (ref 0.0–14.0)
NEUT#: 2.4 10*3/uL (ref 1.5–6.5)
NEUT%: 56.5 % (ref 39.0–75.0)
Platelets: 356 10*3/uL (ref 140–400)
RBC: 5.12 10*6/uL (ref 4.20–5.82)
RDW: 14.5 % (ref 11.0–14.6)
WBC: 4.2 10*3/uL (ref 4.0–10.3)
lymph#: 1.1 10*3/uL (ref 0.9–3.3)

## 2015-10-09 LAB — COMPREHENSIVE METABOLIC PANEL
ALBUMIN: 3.5 g/dL (ref 3.5–5.0)
ALK PHOS: 99 U/L (ref 40–150)
ALT: 12 U/L (ref 0–55)
ANION GAP: 8 meq/L (ref 3–11)
AST: 15 U/L (ref 5–34)
BUN: 20.7 mg/dL (ref 7.0–26.0)
CALCIUM: 9.4 mg/dL (ref 8.4–10.4)
CO2: 25 mEq/L (ref 22–29)
Chloride: 106 mEq/L (ref 98–109)
Creatinine: 1 mg/dL (ref 0.7–1.3)
GLUCOSE: 92 mg/dL (ref 70–140)
POTASSIUM: 4.3 meq/L (ref 3.5–5.1)
SODIUM: 138 meq/L (ref 136–145)
Total Bilirubin: 0.3 mg/dL (ref 0.20–1.20)
Total Protein: 7.5 g/dL (ref 6.4–8.3)

## 2015-10-09 MED ORDER — IOPAMIDOL (ISOVUE-300) INJECTION 61%
75.0000 mL | Freq: Once | INTRAVENOUS | Status: AC | PRN
  Administered 2015-10-09: 75 mL via INTRAVENOUS

## 2015-10-16 ENCOUNTER — Telehealth: Payer: Self-pay | Admitting: Internal Medicine

## 2015-10-16 ENCOUNTER — Ambulatory Visit (HOSPITAL_BASED_OUTPATIENT_CLINIC_OR_DEPARTMENT_OTHER): Payer: Medicare Other | Admitting: Internal Medicine

## 2015-10-16 VITALS — BP 103/77 | HR 76 | Temp 97.6°F | Resp 18 | Ht 65.5 in | Wt 125.9 lb

## 2015-10-16 DIAGNOSIS — Z85118 Personal history of other malignant neoplasm of bronchus and lung: Secondary | ICD-10-CM

## 2015-10-16 DIAGNOSIS — C3412 Malignant neoplasm of upper lobe, left bronchus or lung: Secondary | ICD-10-CM | POA: Diagnosis not present

## 2015-10-16 DIAGNOSIS — C3411 Malignant neoplasm of upper lobe, right bronchus or lung: Secondary | ICD-10-CM

## 2015-10-16 NOTE — Telephone Encounter (Signed)
Avs report and schedule given to patient, per 10/16/15 los.

## 2015-10-16 NOTE — Progress Notes (Signed)
[facility] [phone]   [phone]  OFFICE PROGRESS NOTE  No PCP Per Patient No address on file  PRINCIPAL DIAGNOSIS: 1) stage IIIA (T2a, N2, M0) non-small cell lung cancer, squamous cell carcinoma of the left upper lobe diagnosed in August 2016 2)  Stage IIIA (T2 N2 Mx) non-small cell lung cancer, adenocarcinoma, involving the right upper lobe and mediastinal lymphadenopathy diagnosed in June 2010.   PRIOR THERAPY:  1. Status post concurrent chemoradiation with weekly carboplatin and paclitaxel; last dose was given April 08, 2008. 2. Status post 3 cycles of consolidation chemotherapy with docetaxel; last dose was given on Jun 26, 2008. 3. Concurrent chemoradiation with weekly carboplatin for AUC of 2 and paclitaxel 45 MG/M2. First dose expected on 09/23/2014 status post 7 cycles. Last dose was given 12/01/2014 with partial response. 4. Consolidation chemotherapy with carboplatin for AUC of 5 and paclitaxel 175 MG/M2 every 3 weeks with Neulasta support. First dose 01/02/2015. Status post 3 cycles.  CURRENT THERAPY: Observation.  INTERVAL HISTORY: Evan [name] 65 y.o. male returns to the clinic today for followup visit accompanied by his son. The patient has been observation for the last few months.. He is feeling fine with no complaints except for occasional bilateral parasternal chest pain probably secondary to his radiation. He denied having any cough, shortness of breath or hemoptysis. The patient denied having any significant weight loss or night sweats. He has no nausea or vomiting. He denied having any significant peripheral neuropathy. He had repeat CT scan of the chest performed recently and he is here today for evaluation and discussion of his scan results.  MEDICAL HISTORY: Past Medical History:  Diagnosis Date  . Anxiety   . Complication of anesthesia    " sometimes I wake up during surgery "  . Depression   . Lung cancer ([place])    non small cell lung cancer    ALLERGIES:  has No Known Allergies.  MEDICATIONS:  Current Outpatient Prescriptions  Medication Sig Dispense Refill  . albuterol (PROVENTIL HFA;VENTOLIN HFA) 108 (90 BASE) MCG/ACT inhaler Inhale 2 puffs into the lungs every 6 (six) hours as needed for wheezing or shortness of breath. 1 Inhaler 2  . HYDROcodone-acetaminophen (NORCO/VICODIN) 5-325 MG per tablet Take 1 tablet by mouth every 6 (six) hours as needed for moderate pain. (Patient not taking: Reported on 10/02/2015) 30 tablet 0  . ibuprofen (ADVIL,MOTRIN) 200 MG tablet Take 600 mg by mouth every 6 (six) hours as needed (pain).     . Ibuprofen-Diphenhydramine Cit (IBUPROFEN PM PO) Take 1 tablet by mouth at bedtime as needed (pain/sleep).    . Multiple Vitamin (MULTIVITAMIN WITH MINERALS) TABS tablet Take 1 tablet by mouth daily.    . Nutritional Supplements (CARNATION BREAKFAST ESSENTIALS PO) Take 1 packet by mouth daily.     . prochlorperazine (COMPAZINE) 10 MG tablet Take 1 tablet (10 mg total) by mouth every 6 (six) hours as needed for nausea or vomiting. (Patient not taking: Reported on 07/06/2015) 30 tablet 0  . Tiotropium Bromide-Olodaterol (STIOLTO RESPIMAT) 2.5-2.5 MCG/ACT AERS Inhale 2 puffs into the lungs daily. 1 Inhaler 5   No current facility-administered medications for this visit.     SURGICAL HISTORY:  Past Surgical History:  Procedure Laterality Date  . AMPUTATION Right 05/21/2012   Procedure: AMPUTATION BELOW KNEE;  Surgeon: Newt Minion, MD;  Location: [facility];  Service: Orthopedics;  Laterality: Right;  . AMPUTATION Right 06/21/2012   Procedure: Revision  AMPUTATION BELOW KNEE Right;  Surgeon: Newt Minion, MD;  Location: [facility];  Service: Orthopedics;  Laterality: Right;  Revision right Below Knee Amputation  . COLONOSCOPY W/ BIOPSIES AND POLYPECTOMY    . FOOT AMPUTATION Right    traumatic right lower extremity  . UPPER JAW     SURGERY FOR INFECTION    REVIEW OF SYSTEMS:  A  comprehensive review of systems was negative except for: Constitutional: positive for fatigue Respiratory: positive for pleurisy/chest pain   PHYSICAL EXAMINATION: General appearance: alert, cooperative, fatigued and no distress Head: Normocephalic, without obvious abnormality, atraumatic Neck: no adenopathy Lymph nodes: Cervical, supraclavicular, and axillary nodes normal. Resp: clear to auscultation bilaterally Back: symmetric, no curvature. ROM normal. No CVA tenderness. Cardio: regular rate and rhythm, S1, S2 normal, no murmur, click, rub or gallop GI: soft, non-tender; bowel sounds normal; no masses,  no organomegaly Extremities: Right below knee amputation Neurologic: Alert and oriented X 3, normal strength and tone. Normal symmetric reflexes. Normal coordination and gait  ECOG PERFORMANCE STATUS: 1 - Symptomatic but completely ambulatory  Blood pressure 103/77, pulse 76, temperature 97.6 F (36.4 C), temperature source Oral, resp. rate 18, height 5' 5.5" (1.664 m), weight 125 lb 14.4 oz (57.1 kg), SpO2 100 %.  LABORATORY DATA: Lab Results  Component Value Date   WBC 4.2 10/09/2015   HGB 14.5 10/09/2015   HCT 45.4 10/09/2015   MCV 88.5 10/09/2015   PLT 356 10/09/2015      Chemistry      Component Value Date/Time   NA 138 10/09/2015 1313   K 4.3 10/09/2015 1313   CL 101 07/24/2013 1454   CL 104 07/19/2012 1432   CO2 25 10/09/2015 1313   BUN 20.7 10/09/2015 1313   CREATININE 1.0 10/09/2015 1313      Component Value Date/Time   CALCIUM 9.4 10/09/2015 1313   ALKPHOS 99 10/09/2015 1313   AST 15 10/09/2015 1313   ALT 12 10/09/2015 1313   BILITOT 0.30 10/09/2015 1313       RADIOGRAPHIC STUDIES: Ct Chest W Contrast  Result Date: 10/09/2015 CLINICAL DATA:  Followup non-small-cell left upper lobe lung carcinoma. Increasing anterior chest pain for 2 weeks. Previous chemotherapy and radiation therapy. EXAM: CT CHEST WITH CONTRAST TECHNIQUE: Multidetector CT imaging of  the chest was performed during intravenous contrast administration. CONTRAST:  22m ISOVUE-300 IOPAMIDOL (ISOVUE-300) INJECTION 61% COMPARISON:  06/10/2015 FINDINGS: Cardiovascular: Normal heart size.  Aortic atherosclerosis. Mediastinum/Nodes: No masses, pathologically enlarged lymph nodes, or other significant abnormality. Lungs/Pleura: Bilateral paramediastinal radiation changes remains stable. Soft tissue nodule in the medial left upper lobe abutting the mediastinum measures 1.7 x 3.4 cm on image 41/2 compared to 1.8 by 3.4 cm previously. No new or enlarging pulmonary nodules or masses are identified. No evidence of acute infiltrate or pleural effusion. Upper Abdomen: No acute findings. Musculoskeletal: No chest wall mass or suspicious bone lesions identified. IMPRESSION: Stable soft tissue nodule in the medial left upper lobe abutting the mediastinum. Stable bilateral paramediastinal radiation changes. No new or progressive disease within the thorax. Electronically Signed   By: JEarle GellM.D.   On: 10/09/2015 19:45    ASSESSMENT AND PLAN: This is a very pleasant 65years old [place] male with history of stage IIIA non-small cell lung cancer of the right upper lobe status post concurrent chemoradiation followed by 3 cycles of consolidation chemotherapy and has been observation since May of 2010 with no evidence for disease progression. The patient completed a course  of concurrent chemoradiation for recurrent non-small cell lung cancer, squamous cell carcinoma involving the left upper lobe and mediastinal lymphadenopathy. He is status 3 cycles of consolidation chemotherapy. He is currently on observation for the last 6 months and has no evidence for disease recurrence on the recent CT scan of the chest. I discussed the scan results with the patient and his son today. I recommended for him to continue on observation with repeat CT scan of the chest in 3 months. The patient was advised to call  immediately if he has any concerning symptoms in the interval. He was advised to call immediately if she has any concerning symptoms in the interval.  All questions were answered. The patient knows to call the clinic with any problems, questions or concerns. We can certainly see the patient much sooner if necessary.  Disclaimer: This note was dictated with voice recognition software. Similar sounding words can inadvertently be transcribed and may not be corrected upon review.

## 2015-10-17 ENCOUNTER — Encounter: Payer: Self-pay | Admitting: Internal Medicine

## 2015-10-18 NOTE — Progress Notes (Signed)
Reviewed & agree with plan  

## 2016-01-14 ENCOUNTER — Encounter (HOSPITAL_COMMUNITY): Payer: Self-pay

## 2016-01-14 ENCOUNTER — Ambulatory Visit (HOSPITAL_COMMUNITY): Payer: Medicare Other

## 2016-01-14 ENCOUNTER — Ambulatory Visit (HOSPITAL_COMMUNITY)
Admission: RE | Admit: 2016-01-14 | Discharge: 2016-01-14 | Disposition: A | Discharge status: Home / Self Care | Payer: Medicare Other | Source: Ambulatory Visit | Attending: Internal Medicine | Admitting: Internal Medicine

## 2016-01-14 ENCOUNTER — Other Ambulatory Visit (HOSPITAL_BASED_OUTPATIENT_CLINIC_OR_DEPARTMENT_OTHER): Payer: Medicare Other

## 2016-01-14 DIAGNOSIS — C3412 Malignant neoplasm of upper lobe, left bronchus or lung: Secondary | ICD-10-CM

## 2016-01-14 DIAGNOSIS — C3411 Malignant neoplasm of upper lobe, right bronchus or lung: Secondary | ICD-10-CM | POA: Diagnosis not present

## 2016-01-14 DIAGNOSIS — C3490 Malignant neoplasm of unspecified part of unspecified bronchus or lung: Secondary | ICD-10-CM | POA: Diagnosis not present

## 2016-01-14 LAB — CBC WITH DIFFERENTIAL/PLATELET
BASO%: 1.1 % (ref 0.0–2.0)
Basophils Absolute: 0 10*3/uL (ref 0.0–0.1)
EOS%: 9 % — AB (ref 0.0–7.0)
Eosinophils Absolute: 0.4 10*3/uL (ref 0.0–0.5)
HEMATOCRIT: 46.7 % (ref 38.4–49.9)
HEMOGLOBIN: 14.9 g/dL (ref 13.0–17.1)
LYMPH#: 1 10*3/uL (ref 0.9–3.3)
LYMPH%: 22.4 % (ref 14.0–49.0)
MCH: 28.4 pg (ref 27.2–33.4)
MCHC: 31.8 g/dL — ABNORMAL LOW (ref 32.0–36.0)
MCV: 89.4 fL (ref 79.3–98.0)
MONO#: 0.4 10*3/uL (ref 0.1–0.9)
MONO%: 8.2 % (ref 0.0–14.0)
NEUT%: 59.3 % (ref 39.0–75.0)
NEUTROS ABS: 2.6 10*3/uL (ref 1.5–6.5)
Platelets: 362 10*3/uL (ref 140–400)
RBC: 5.23 10*6/uL (ref 4.20–5.82)
RDW: 14.3 % (ref 11.0–14.6)
WBC: 4.3 10*3/uL (ref 4.0–10.3)

## 2016-01-14 LAB — COMPREHENSIVE METABOLIC PANEL
ALBUMIN: 3.7 g/dL (ref 3.5–5.0)
ALK PHOS: 121 U/L (ref 40–150)
ALT: 11 U/L (ref 0–55)
AST: 16 U/L (ref 5–34)
Anion Gap: 8 mEq/L (ref 3–11)
BUN: 13 mg/dL (ref 7.0–26.0)
CALCIUM: 9.4 mg/dL (ref 8.4–10.4)
CHLORIDE: 108 meq/L (ref 98–109)
CO2: 25 mEq/L (ref 22–29)
CREATININE: 0.9 mg/dL (ref 0.7–1.3)
EGFR: >90 mL/min/{1.73_m2} (ref >90)
Glucose: 94 mg/dl (ref 70–140)
Potassium: 4.2 mEq/L (ref 3.5–5.1)
Sodium: 142 mEq/L (ref 136–145)
Total Bilirubin: 0.36 mg/dL (ref 0.20–1.20)
Total Protein: 7.9 g/dL (ref 6.4–8.3)

## 2016-01-14 MED ORDER — SODIUM CHLORIDE 0.9 % IJ SOLN
INTRAMUSCULAR | Status: AC
  Filled 2016-01-14: qty 50

## 2016-01-14 MED ORDER — IOPAMIDOL (ISOVUE-300) INJECTION 61%
INTRAVENOUS | Status: AC
  Filled 2016-01-14: qty 75

## 2016-01-14 MED ORDER — IOPAMIDOL (ISOVUE-300) INJECTION 61%
75.0000 mL | Freq: Once | INTRAVENOUS | Status: AC | PRN
  Administered 2016-01-14: 75 mL via INTRAVENOUS

## 2016-01-19 ENCOUNTER — Telehealth: Payer: Self-pay | Admitting: Internal Medicine

## 2016-01-19 ENCOUNTER — Encounter: Payer: Self-pay | Admitting: Internal Medicine

## 2016-01-19 ENCOUNTER — Ambulatory Visit (HOSPITAL_BASED_OUTPATIENT_CLINIC_OR_DEPARTMENT_OTHER): Payer: Medicare Other | Admitting: Internal Medicine

## 2016-01-19 VITALS — BP 123/87 | HR 72 | Temp 97.5°F | Resp 18 | Ht 65.5 in | Wt 128.7 lb

## 2016-01-19 DIAGNOSIS — Z85118 Personal history of other malignant neoplasm of bronchus and lung: Secondary | ICD-10-CM | POA: Diagnosis not present

## 2016-01-19 DIAGNOSIS — C3412 Malignant neoplasm of upper lobe, left bronchus or lung: Secondary | ICD-10-CM

## 2016-01-19 DIAGNOSIS — C3411 Malignant neoplasm of upper lobe, right bronchus or lung: Secondary | ICD-10-CM | POA: Diagnosis not present

## 2016-01-19 DIAGNOSIS — R05 Cough: Secondary | ICD-10-CM | POA: Diagnosis not present

## 2016-01-19 NOTE — Progress Notes (Signed)
[place] Telephone:([phone]) [phone]   Fax:(336) [phone]  OFFICE PROGRESS NOTE  No PCP Per Patient No address on file  PRINCIPAL DIAGNOSIS: 1) stage IIIA (T2a, N2, M0) non-small cell lung cancer, squamous cell carcinoma of the left upper lobe diagnosed in August 2016 2)  Stage IIIA (T2 N2 Mx) non-small cell lung cancer, adenocarcinoma, involving the right upper lobe and mediastinal lymphadenopathy diagnosed in June 2010.   PRIOR THERAPY:  1. Status post concurrent chemoradiation with weekly carboplatin and paclitaxel; last dose was given April 08, 2008. 2. Status post 3 cycles of consolidation chemotherapy with docetaxel; last dose was given on Jun 26, 2008. 3. Concurrent chemoradiation with weekly carboplatin for AUC of 2 and paclitaxel 45 MG/M2. First dose expected on 09/23/2014 status post 7 cycles. Last dose was given 12/01/2014 with partial response. 4. Consolidation chemotherapy with carboplatin for AUC of 5 and paclitaxel 175 MG/M2 every 3 weeks with Neulasta support. First dose 01/02/2015. Status post 3 cycles.  CURRENT THERAPY: Observation.  INTERVAL HISTORY: Evan [name] 65 y.o. male came today to the clinic for three-month follow-up visit. The patient is feeling fine today with no specific complaints except for mild cough with occasional sputum production. He denied having any significant chest pain or shortness of breath. He has no hemoptysis. He denied having any significant weight loss or night sweats. He has no fever or chills. He denied having any headache or visual changes. He had repeat CT scan of the chest performed recently and he is here for evaluation and discussion of his scan results.  MEDICAL HISTORY: Past Medical History:  Diagnosis Date  . Anxiety   . Complication of anesthesia    " sometimes I wake up during surgery "  . Depression   . Lung cancer ([place])    non small cell lung cancer    ALLERGIES:  has No Known  Allergies.  MEDICATIONS:  Current Outpatient Prescriptions  Medication Sig Dispense Refill  . albuterol (PROVENTIL HFA;VENTOLIN HFA) 108 (90 BASE) MCG/ACT inhaler Inhale 2 puffs into the lungs every 6 (six) hours as needed for wheezing or shortness of breath. 1 Inhaler 2  . HYDROcodone-acetaminophen (NORCO/VICODIN) 5-325 MG per tablet Take 1 tablet by mouth every 6 (six) hours as needed for moderate pain. (Patient not taking: Reported on 10/02/2015) 30 tablet 0  . ibuprofen (ADVIL,MOTRIN) 200 MG tablet Take 600 mg by mouth every 6 (six) hours as needed (pain).     . Ibuprofen-Diphenhydramine Cit (IBUPROFEN PM PO) Take 1 tablet by mouth at bedtime as needed (pain/sleep).    . Multiple Vitamin (MULTIVITAMIN WITH MINERALS) TABS tablet Take 1 tablet by mouth daily.    . Nutritional Supplements (CARNATION BREAKFAST ESSENTIALS PO) Take 1 packet by mouth daily.     . prochlorperazine (COMPAZINE) 10 MG tablet Take 1 tablet (10 mg total) by mouth every 6 (six) hours as needed for nausea or vomiting. (Patient not taking: Reported on 07/06/2015) 30 tablet 0  . Tiotropium Bromide-Olodaterol (STIOLTO RESPIMAT) 2.5-2.5 MCG/ACT AERS Inhale 2 puffs into the lungs daily. 1 Inhaler 5   No current facility-administered medications for this visit.     SURGICAL HISTORY:  Past Surgical History:  Procedure Laterality Date  . AMPUTATION Right 05/21/2012   Procedure: AMPUTATION BELOW KNEE;  Surgeon: Newt Minion, MD;  Location: [facility];  Service: Orthopedics;  Laterality: Right;  . AMPUTATION Right 06/21/2012   Procedure: Revision AMPUTATION BELOW KNEE Right;  Surgeon: Newt Minion,  MD;  Location: [facility];  Service: Orthopedics;  Laterality: Right;  Revision right Below Knee Amputation  . COLONOSCOPY W/ BIOPSIES AND POLYPECTOMY    . FOOT AMPUTATION Right    traumatic right lower extremity  . UPPER JAW     SURGERY FOR INFECTION    REVIEW OF SYSTEMS:  A comprehensive review of systems was negative except for:  Respiratory: positive for cough and sputum   PHYSICAL EXAMINATION: General appearance: alert, cooperative and no distress Head: Normocephalic, without obvious abnormality, atraumatic Neck: no adenopathy Lymph nodes: Cervical, supraclavicular, and axillary nodes normal. Resp: clear to auscultation bilaterally Back: symmetric, no curvature. ROM normal. No CVA tenderness. Cardio: regular rate and rhythm, S1, S2 normal, no murmur, click, rub or gallop GI: soft, non-tender; bowel sounds normal; no masses,  no organomegaly Extremities: extremities normal, atraumatic, no cyanosis or edema  ECOG PERFORMANCE STATUS: 0 - Asymptomatic  Blood pressure 123/87, pulse 72, temperature 97.5 F (36.4 C), temperature source Oral, resp. rate 18, height 5' 5.5" (1.664 m), weight 128 lb 11.2 oz (58.4 kg), SpO2 100 %.  LABORATORY DATA: Lab Results  Component Value Date   WBC 4.3 01/14/2016   HGB 14.9 01/14/2016   HCT 46.7 01/14/2016   MCV 89.4 01/14/2016   PLT 362 01/14/2016      Chemistry      Component Value Date/Time   NA 142 01/14/2016 1218   K 4.2 01/14/2016 1218   CL 101 07/24/2013 1454   CL 104 07/19/2012 1432   CO2 25 01/14/2016 1218   BUN 13.0 01/14/2016 1218   CREATININE 0.9 01/14/2016 1218      Component Value Date/Time   CALCIUM 9.4 01/14/2016 1218   ALKPHOS 121 01/14/2016 1218   AST 16 01/14/2016 1218   ALT 11 01/14/2016 1218   BILITOT 0.36 01/14/2016 1218       RADIOGRAPHIC STUDIES: Ct Chest W Contrast  Result Date: 01/14/2016 CLINICAL DATA:  Restaging lung cancer. Initial diagnosis 2010. Right-sided chest pain for 3 months with cough and shortness of breath. EXAM: CT CHEST WITH CONTRAST TECHNIQUE: Multidetector CT imaging of the chest was performed during intravenous contrast administration. CONTRAST:  88m ISOVUE-300 IOPAMIDOL (ISOVUE-300) INJECTION 61% COMPARISON:  10/09/2015 FINDINGS: Chest wall: No chest wall mass, supraclavicular or axillary lymphadenopathy. The  thyroid gland appears normal. Cardiovascular: The heart is normal in size. No pericardial effusion. Stable mild tortuosity of the thoracic aorta. No dissection. The branch vessels are patent. The pulmonary artery is appear normal. Mediastinum/Nodes: Stable old small scattered mediastinal and hilar lymph nodes. No mass or overt adenopathy. Lungs/Pleura: Stable extensive paramediastinal radiation fibrosis changes. Matted soft tissue density adjacent to the aortic arch is unchanged and is likely treated tumor. Stable advanced emphysematous changes and pulmonary scarring. No worrisome pulmonary nodules to suggest metastatic disease. No pleural effusion. Upper Abdomen: No significant upper abdominal findings. No findings for hepatic or adrenal gland metastasis. There is a stable small nodule associated with the left adrenal gland which is likely a benign adenoma. Musculoskeletal: No significant bony findings. IMPRESSION: Stable CT appearance of the chest when compared to prior examinations. Stable radiation changes involving the paramediastinal lung bilaterally and stable soft tissue density adjacent to the aortic arch. No findings suspicious for recurrent tumor or metastatic disease. Electronically Signed   By: PMarijo SanesM.D.   On: 01/14/2016 15:43    ASSESSMENT AND PLAN: This is a very pleasant 65years old African-American male with history of stage IIIa non-small cell lung cancer of  the right upper lobe status post concurrent chemoradiation followed by 3 cycles of consolidation chemotherapy completed in May 2010. 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 He is feeling fine and the recent CT scan of the chest showed no evidence for disease progression. I discussed the scan results with the patient today. I recommended for him to continue on observation with repeat CT  scan of the chest in 6 months. He was advised to call immediately if he has any concerning symptoms in the interval. All questions were answered. The patient knows to call the clinic with any problems, questions or concerns. We can certainly see the patient much sooner if necessary. I spent 10 minutes counseling the patient face to face. The total time spent in the appointment was 15 minutes. Disclaimer: This note was dictated with voice recognition software. Similar sounding words can inadvertently be transcribed and may not be corrected upon review.

## 2016-01-19 NOTE — Telephone Encounter (Signed)
Appointments scheduled per 12/18 LOS. Patient given AVS report and calendars with future scheduled appointments. The patient is aware of CT scan appointment to be scheduled. Stated that he has the prep/ contrast intravenously, contrast not given.

## 2016-01-29 ENCOUNTER — Ambulatory Visit: Payer: Medicare Other | Admitting: Pulmonary Disease

## 2016-02-18 ENCOUNTER — Other Ambulatory Visit: Payer: Self-pay | Admitting: Nurse Practitioner

## 2016-03-02 ENCOUNTER — Ambulatory Visit: Payer: Medicare Other | Admitting: Pulmonary Disease

## 2016-03-26 ENCOUNTER — Ambulatory Visit: Payer: Medicare Other | Admitting: Pulmonary Disease

## 2016-07-16 ENCOUNTER — Other Ambulatory Visit (HOSPITAL_BASED_OUTPATIENT_CLINIC_OR_DEPARTMENT_OTHER): Payer: Medicare PPO

## 2016-07-16 ENCOUNTER — Encounter (HOSPITAL_COMMUNITY): Payer: Self-pay

## 2016-07-16 ENCOUNTER — Ambulatory Visit (HOSPITAL_COMMUNITY)
Admission: RE | Admit: 2016-07-16 | Discharge: 2016-07-16 | Disposition: A | Discharge status: Home / Self Care | Payer: Medicare PPO | Source: Ambulatory Visit | Attending: Internal Medicine | Admitting: Internal Medicine

## 2016-07-16 DIAGNOSIS — J432 Centrilobular emphysema: Secondary | ICD-10-CM | POA: Diagnosis not present

## 2016-07-16 DIAGNOSIS — C3411 Malignant neoplasm of upper lobe, right bronchus or lung: Secondary | ICD-10-CM | POA: Insufficient documentation

## 2016-07-16 DIAGNOSIS — C3412 Malignant neoplasm of upper lobe, left bronchus or lung: Secondary | ICD-10-CM | POA: Insufficient documentation

## 2016-07-16 DIAGNOSIS — M47814 Spondylosis without myelopathy or radiculopathy, thoracic region: Secondary | ICD-10-CM | POA: Insufficient documentation

## 2016-07-16 DIAGNOSIS — I6523 Occlusion and stenosis of bilateral carotid arteries: Secondary | ICD-10-CM | POA: Insufficient documentation

## 2016-07-16 DIAGNOSIS — I700 Atherosclerosis of aorta: Secondary | ICD-10-CM | POA: Insufficient documentation

## 2016-07-16 DIAGNOSIS — J439 Emphysema, unspecified: Secondary | ICD-10-CM | POA: Diagnosis not present

## 2016-07-16 DIAGNOSIS — J479 Bronchiectasis, uncomplicated: Secondary | ICD-10-CM | POA: Diagnosis not present

## 2016-07-16 LAB — CBC WITH DIFFERENTIAL/PLATELET
BASO%: 1.3 % (ref 0.0–2.0)
BASOS ABS: 0.1 10*3/uL (ref 0.0–0.1)
EOS ABS: 0.4 10*3/uL (ref 0.0–0.5)
EOS%: 10.3 % — ABNORMAL HIGH (ref 0.0–7.0)
HCT: 44.7 % (ref 38.4–49.9)
HEMOGLOBIN: 14.6 g/dL (ref 13.0–17.1)
LYMPH%: 24.6 % (ref 14.0–49.0)
MCH: 29.1 pg (ref 27.2–33.4)
MCHC: 32.7 g/dL (ref 32.0–36.0)
MCV: 89 fL (ref 79.3–98.0)
MONO#: 0.3 10*3/uL (ref 0.1–0.9)
MONO%: 7.8 % (ref 0.0–14.0)
NEUT#: 2.4 10*3/uL (ref 1.5–6.5)
NEUT%: 56 % (ref 39.0–75.0)
Platelets: 367 10*3/uL (ref 140–400)
RBC: 5.02 10*6/uL (ref 4.20–5.82)
RDW: 14.3 % (ref 11.0–14.6)
WBC: 4.2 10*3/uL (ref 4.0–10.3)
lymph#: 1 10*3/uL (ref 0.9–3.3)

## 2016-07-16 LAB — COMPREHENSIVE METABOLIC PANEL
ALBUMIN: 3.8 g/dL (ref 3.5–5.0)
ALK PHOS: 92 U/L (ref 40–150)
ALT: 14 U/L (ref 0–55)
AST: 18 U/L (ref 5–34)
Anion Gap: 10 mEq/L (ref 3–11)
BUN: 8.8 mg/dL (ref 7.0–26.0)
CO2: 24 mEq/L (ref 22–29)
Calcium: 9.3 mg/dL (ref 8.4–10.4)
Chloride: 107 mEq/L (ref 98–109)
Creatinine: 1 mg/dL (ref 0.7–1.3)
GLUCOSE: 89 mg/dL (ref 70–140)
POTASSIUM: 4 meq/L (ref 3.5–5.1)
SODIUM: 141 meq/L (ref 136–145)
Total Bilirubin: 0.39 mg/dL (ref 0.20–1.20)
Total Protein: 7.5 g/dL (ref 6.4–8.3)

## 2016-07-16 MED ORDER — IOPAMIDOL (ISOVUE-300) INJECTION 61%
INTRAVENOUS | Status: AC
  Filled 2016-07-16: qty 75

## 2016-07-16 MED ORDER — IOPAMIDOL (ISOVUE-300) INJECTION 61%
75.0000 mL | Freq: Once | INTRAVENOUS | Status: AC | PRN
  Administered 2016-07-16: 75 mL via INTRAVENOUS

## 2016-07-19 ENCOUNTER — Ambulatory Visit (HOSPITAL_BASED_OUTPATIENT_CLINIC_OR_DEPARTMENT_OTHER): Payer: Medicare PPO | Admitting: Internal Medicine

## 2016-07-19 ENCOUNTER — Telehealth: Payer: Self-pay | Admitting: Internal Medicine

## 2016-07-19 ENCOUNTER — Encounter: Payer: Self-pay | Admitting: Internal Medicine

## 2016-07-19 VITALS — BP 132/79 | HR 76 | Temp 98.4°F | Resp 18 | Ht 65.5 in | Wt 129.6 lb

## 2016-07-19 DIAGNOSIS — C3411 Malignant neoplasm of upper lobe, right bronchus or lung: Secondary | ICD-10-CM

## 2016-07-19 DIAGNOSIS — C3412 Malignant neoplasm of upper lobe, left bronchus or lung: Secondary | ICD-10-CM

## 2016-07-19 DIAGNOSIS — Z85118 Personal history of other malignant neoplasm of bronchus and lung: Secondary | ICD-10-CM

## 2016-07-19 DIAGNOSIS — N2889 Other specified disorders of kidney and ureter: Secondary | ICD-10-CM

## 2016-07-19 HISTORY — DX: Other specified disorders of kidney and ureter: N28.89

## 2016-07-19 NOTE — Progress Notes (Signed)
[place] [place] Telephone:(336) [phone]   Fax:(336) [phone]  OFFICE PROGRESS NOTE  Patient, No Pcp Per No address on file  PRINCIPAL DIAGNOSIS: 1) stage IIIA (T2a, N2, M0) non-small cell lung cancer, squamous cell carcinoma of the left upper lobe diagnosed in August 2016 2)  Stage IIIA (T2 N2 Mx) non-small cell lung cancer, adenocarcinoma, involving the right upper lobe and mediastinal lymphadenopathy diagnosed in June 2010.   PRIOR THERAPY:  1. Status post concurrent chemoradiation with weekly carboplatin and paclitaxel; last dose was given April 08, 2008. 2. Status post 3 cycles of consolidation chemotherapy with docetaxel; last dose was given on Jun 26, 2008. 3. Concurrent chemoradiation with weekly carboplatin for AUC of 2 and paclitaxel 45 MG/M2. First dose expected on 09/23/2014 status post 7 cycles. Last dose was given 12/01/2014 with partial response. 4. Consolidation chemotherapy with carboplatin for AUC of 5 and paclitaxel 175 MG/M2 every 3 weeks with Neulasta support. First dose 01/02/2015. Status post 3 cycles.  CURRENT THERAPY: Observation.  INTERVAL HISTORY: Evan [name] 66 y.o. male returns to the clinic today for six-month follow-up visit. The patient is feeling fine with no specific complaints. He denied having any chest pain, shortness of breath, cough or hemoptysis. He has no fever or chills. He denied having any weight loss or night sweats. He has no chest pain, shortness breath, cough or hemoptysis. He denied having any nausea, vomiting, diarrhea or constipation. He denied having any dysuria or hematuria. He had repeat CT scan of the chest performed recently and he is here for evaluation and discussion of his scan results.  MEDICAL HISTORY: Past Medical History:  Diagnosis Date  . Anxiety   . Complication of anesthesia    " sometimes I wake up during surgery "  . Depression   . Lung cancer ([place])    non small cell lung cancer    ALLERGIES:   has No Known Allergies.  MEDICATIONS:  Current Outpatient Prescriptions  Medication Sig Dispense Refill  . albuterol (PROVENTIL HFA;VENTOLIN HFA) 108 (90 BASE) MCG/ACT inhaler Inhale 2 puffs into the lungs every 6 (six) hours as needed for wheezing or shortness of breath. 1 Inhaler 2  . HYDROcodone-acetaminophen (NORCO/VICODIN) 5-325 MG per tablet Take 1 tablet by mouth every 6 (six) hours as needed for moderate pain. (Patient not taking: Reported on 10/02/2015) 30 tablet 0  . ibuprofen (ADVIL,MOTRIN) 200 MG tablet Take 600 mg by mouth every 6 (six) hours as needed (pain).     . Ibuprofen-Diphenhydramine Cit (IBUPROFEN PM PO) Take 1 tablet by mouth at bedtime as needed (pain/sleep).    . Multiple Vitamin (MULTIVITAMIN WITH MINERALS) TABS tablet Take 1 tablet by mouth daily.    . Nutritional Supplements (CARNATION BREAKFAST ESSENTIALS PO) Take 1 packet by mouth daily.     . prochlorperazine (COMPAZINE) 10 MG tablet Take 1 tablet (10 mg total) by mouth every 6 (six) hours as needed for nausea or vomiting. (Patient not taking: Reported on 07/06/2015) 30 tablet 0  . Tiotropium Bromide-Olodaterol (STIOLTO RESPIMAT) 2.5-2.5 MCG/ACT AERS Inhale 2 puffs into the lungs daily. 1 Inhaler 5   No current facility-administered medications for this visit.     SURGICAL HISTORY:  Past Surgical History:  Procedure Laterality Date  . AMPUTATION Right 05/21/2012   Procedure: AMPUTATION BELOW KNEE;  Surgeon: Newt Minion, MD;  Location: [facility];  Service: Orthopedics;  Laterality: Right;  . AMPUTATION Right 06/21/2012   Procedure: Revision AMPUTATION BELOW KNEE Right;  Surgeon: Newt Minion, MD;  Location: [facility];  Service: Orthopedics;  Laterality: Right;  Revision right Below Knee Amputation  . COLONOSCOPY W/ BIOPSIES AND POLYPECTOMY    . FOOT AMPUTATION Right    traumatic right lower extremity  . UPPER JAW     SURGERY FOR INFECTION    REVIEW OF SYSTEMS:  Constitutional: negative Eyes: negative Ears, nose,  mouth, throat, and face: negative Respiratory: negative Cardiovascular: negative Gastrointestinal: negative Genitourinary:negative Integument/breast: negative Hematologic/lymphatic: negative Musculoskeletal:negative Neurological: negative Behavioral/Psych: negative Endocrine: negative Allergic/Immunologic: negative   PHYSICAL EXAMINATION: General appearance: alert, cooperative and no distress Head: Normocephalic, without obvious abnormality, atraumatic Neck: no adenopathy Lymph nodes: Cervical, supraclavicular, and axillary nodes normal. Resp: clear to auscultation bilaterally Back: symmetric, no curvature. ROM normal. No CVA tenderness. Cardio: regular rate and rhythm, S1, S2 normal, no murmur, click, rub or gallop GI: soft, non-tender; bowel sounds normal; no masses,  no organomegaly Extremities: extremities normal, atraumatic, no cyanosis or edema Neurologic: Alert and oriented X 3, normal strength and tone. Normal symmetric reflexes. Normal coordination and gait  ECOG PERFORMANCE STATUS: 0 - Asymptomatic  Blood pressure 132/79, pulse 76, temperature 98.4 F (36.9 C), temperature source Oral, resp. rate 18, height 5' 5.5" (1.664 m), weight 129 lb 9.6 oz (58.8 kg), SpO2 99 %.  LABORATORY DATA: Lab Results  Component Value Date   WBC 4.2 07/16/2016   HGB 14.6 07/16/2016   HCT 44.7 07/16/2016   MCV 89.0 07/16/2016   PLT 367 07/16/2016      Chemistry      Component Value Date/Time   NA 141 07/16/2016 1220   K 4.0 07/16/2016 1220   CL 101 07/24/2013 1454   CL 104 07/19/2012 1432   CO2 24 07/16/2016 1220   BUN 8.8 07/16/2016 1220   CREATININE 1.0 07/16/2016 1220      Component Value Date/Time   CALCIUM 9.3 07/16/2016 1220   ALKPHOS 92 07/16/2016 1220   AST 18 07/16/2016 1220   ALT 14 07/16/2016 1220   BILITOT 0.39 07/16/2016 1220       RADIOGRAPHIC STUDIES: Ct Chest W Contrast  Result Date: 07/16/2016 CLINICAL DATA:  Stage IIIA primary bronchogenic  adenocarcinoma of the right upper lobe diagnosed June 2010 status post concurrent chemoradiation therapy and consolidation chemotherapy. Stage IIIA squamous cell left upper lobe primary bronchogenic carcinoma diagnosed August 2016 status post concurrent chemoradiation therapy and consolidation chemotherapy. Patient presents for restaging on interval observation. EXAM: CT CHEST WITH CONTRAST TECHNIQUE: Multidetector CT imaging of the chest was performed during intravenous contrast administration. CONTRAST:  45mL ISOVUE-300 IOPAMIDOL (ISOVUE-300) INJECTION 61% COMPARISON:  01/14/2016 chest CT. FINDINGS: Cardiovascular: Normal heart size. No significant pericardial fluid/thickening. Stable appearing bilateral common carotid artery stenoses (series 2/image 18 on the right and image 23 on the left). Atherosclerotic nonaneurysmal thoracic aorta. Normal caliber pulmonary arteries. No central pulmonary emboli. Mediastinum/Nodes: No discrete thyroid nodules. Unremarkable esophagus. No pathologically enlarged axillary, mediastinal or hilar lymph nodes. Lungs/Pleura: No pneumothorax. No pleural effusion. Moderate centrilobular emphysema with mild diffuse bronchial wall thickening. Stable sharply marginated consolidation with associated volume loss, bronchiectasis and distortion in the paramediastinal right upper lung, compatible with radiation fibrosis. Stable masslike fibrosis in the anteromedial left upper lobe measuring 2.7 x 1.3 cm (series 5/ image 45), with associated volume loss and distortion. No acute consolidative airspace disease or new significant pulmonary nodules. Upper abdomen: Hypodense 1.7 x 1.2 cm renal cortical lesion in the lateral lower left kidney (series 2/ image 159), not included on  prior chest CT studies. Musculoskeletal: No aggressive appearing focal osseous lesions. Mild thoracic spondylosis. IMPRESSION: 1. Stable radiation changes in the medial upper lung lobes bilaterally. No findings to suggest  local tumor recurrence. 2. No evidence of metastatic disease in the chest. 3. Indeterminate hypodense 1.7 cm renal cortical lesion in the lateral lower left kidney, not included on prior chest CT studies. Recommend MRI (preferred) or CT abdomen without and with IV contrast for further characterization. Renal cell carcinoma not excluded. 4. Stable bilateral common carotid artery stenoses. Aortic Atherosclerosis (ICD10-I70.0) and Emphysema (ICD10-J43.9). Electronically Signed   By: Ilona Sorrel M.D.   On: 07/16/2016 16:26    ASSESSMENT AND PLAN:  This is a very pleasant 66 years old [place] American male with history of 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 The patient developed another stage IIIa non-small cell lung cancer, squamous cell carcinoma of the left upper lobe status post concurrent chemoradiation followed by consolidation chemotherapy. The patient is currently on observation. Recent CT scan of the chest showed no clear evidence for disease recurrence but it showed questionable mass in the left kidney suspicious for renal cell carcinoma.. I personally and independently reviewed the scan images and discuss the results with the patient today. I recommended for him to have MRI of the abdomen for further evaluation of the left kidney lesion. I will see him back for follow-up visit in 3 weeks for reevaluation and discussion of his MRI results and further recommendation regarding his treatment. He was advised to call immediately if he has any concerning symptoms in the interval. All questions were answered. The patient knows to call the clinic with any problems, questions or concerns. We can certainly see the patient much sooner if necessary. I spent 15 minutes counseling the patient face to face. The total time spent in the appointment was 25 minutes.  Disclaimer: This note was  dictated with voice recognition software. Similar sounding words can inadvertently be transcribed and may not be corrected upon review.

## 2016-07-19 NOTE — Telephone Encounter (Signed)
Appointments scheduled per 07/19/16 los. Patient was given a copy of the AVS report and appointment schedule, per 07/19/16 los.

## 2016-07-27 ENCOUNTER — Telehealth: Payer: Self-pay | Admitting: Medical Oncology

## 2016-07-27 NOTE — Telephone Encounter (Signed)
Has not heard aobut ct scan . I gave pt phone number for central scheduling.

## 2016-08-03 ENCOUNTER — Ambulatory Visit (HOSPITAL_COMMUNITY)
Admission: RE | Admit: 2016-08-03 | Discharge: 2016-08-03 | Disposition: A | Discharge status: Home / Self Care | Payer: Medicare PPO | Source: Ambulatory Visit | Attending: Internal Medicine | Admitting: Internal Medicine

## 2016-08-03 DIAGNOSIS — N2889 Other specified disorders of kidney and ureter: Secondary | ICD-10-CM | POA: Diagnosis not present

## 2016-08-03 DIAGNOSIS — C3411 Malignant neoplasm of upper lobe, right bronchus or lung: Secondary | ICD-10-CM | POA: Insufficient documentation

## 2016-08-03 DIAGNOSIS — C3412 Malignant neoplasm of upper lobe, left bronchus or lung: Secondary | ICD-10-CM | POA: Insufficient documentation

## 2016-08-03 MED ORDER — GADOBENATE DIMEGLUMINE 529 MG/ML IV SOLN
15.0000 mL | Freq: Once | INTRAVENOUS | Status: AC | PRN
  Administered 2016-08-03: 11 mL via INTRAVENOUS

## 2016-08-09 ENCOUNTER — Telehealth: Payer: Self-pay | Admitting: Internal Medicine

## 2016-08-09 ENCOUNTER — Ambulatory Visit (HOSPITAL_BASED_OUTPATIENT_CLINIC_OR_DEPARTMENT_OTHER): Payer: Medicare PPO | Admitting: Internal Medicine

## 2016-08-09 ENCOUNTER — Encounter: Payer: Self-pay | Admitting: Internal Medicine

## 2016-08-09 VITALS — BP 142/89 | HR 87 | Temp 97.7°F | Resp 20 | Ht 65.5 in | Wt 129.3 lb

## 2016-08-09 DIAGNOSIS — C3412 Malignant neoplasm of upper lobe, left bronchus or lung: Secondary | ICD-10-CM | POA: Diagnosis not present

## 2016-08-09 DIAGNOSIS — N2889 Other specified disorders of kidney and ureter: Secondary | ICD-10-CM

## 2016-08-09 DIAGNOSIS — C3411 Malignant neoplasm of upper lobe, right bronchus or lung: Secondary | ICD-10-CM

## 2016-08-09 DIAGNOSIS — Z85118 Personal history of other malignant neoplasm of bronchus and lung: Secondary | ICD-10-CM

## 2016-08-09 NOTE — Progress Notes (Signed)
[place] [phone]   [phone]  OFFICE PROGRESS NOTE  Patient, No Pcp Per No address on file  PRINCIPAL DIAGNOSIS: 1) stage IIIA (T2a, N2, M0) non-small cell lung cancer, squamous cell carcinoma of the left upper lobe diagnosed in August 2016 2)  Stage IIIA (T2 N2 Mx) non-small cell lung cancer, adenocarcinoma, involving the right upper lobe and mediastinal lymphadenopathy diagnosed in June 2010.   PRIOR THERAPY:  1. Status post concurrent chemoradiation with weekly carboplatin and paclitaxel; last dose was given April 08, 2008. 2. Status post 3 cycles of consolidation chemotherapy with docetaxel; last dose was given on Jun 26, 2008. 3. Concurrent chemoradiation with weekly carboplatin for AUC of 2 and paclitaxel 45 MG/M2. First dose expected on 09/23/2014 status post 7 cycles. Last dose was given 12/01/2014 with partial response. 4. Consolidation chemotherapy with carboplatin for AUC of 5 and paclitaxel 175 MG/M2 every 3 weeks with Neulasta support. First dose 01/02/2015. Status post 3 cycles.  CURRENT THERAPY: Observation.  INTERVAL HISTORY: Evan [name] 66 y.o. male returns to the clinic today for follow-up visit accompanied by his daughter. The patient is feeling fine today with no specific complaints. He denied having any chest pain, shortness of breath, cough or hemoptysis. He denied having any fever or chills. He has no nausea, vomiting, diarrhea or constipation. He has no significant weight loss or night sweats. He was found on previous CT scan of the chest to have questionable lesion in left kidney. I ordered MRI of the abdomen for evaluation of this lesion and the patient is here today for discussion of his MRI results and treatment options.  MEDICAL HISTORY: Past Medical History:  Diagnosis Date  . Anxiety   . Complication of anesthesia    " sometimes I wake up during surgery "  . Depression   . Left renal mass 07/19/2016  .  Lung cancer ([place])    non small cell lung cancer    ALLERGIES:  has No Known Allergies.  MEDICATIONS:  Current Outpatient Prescriptions  Medication Sig Dispense Refill  . albuterol (PROVENTIL HFA;VENTOLIN HFA) 108 (90 BASE) MCG/ACT inhaler Inhale 2 puffs into the lungs every 6 (six) hours as needed for wheezing or shortness of breath. (Patient not taking: Reported on 07/19/2016) 1 Inhaler 2  . HYDROcodone-acetaminophen (NORCO/VICODIN) 5-325 MG per tablet Take 1 tablet by mouth every 6 (six) hours as needed for moderate pain. (Patient not taking: Reported on 10/02/2015) 30 tablet 0  . ibuprofen (ADVIL,MOTRIN) 200 MG tablet Take 600 mg by mouth every 6 (six) hours as needed (pain).     . Ibuprofen-Diphenhydramine Cit (IBUPROFEN PM PO) Take 1 tablet by mouth at bedtime as needed (pain/sleep).    . Multiple Vitamin (MULTIVITAMIN WITH MINERALS) TABS tablet Take 1 tablet by mouth daily.    . Nutritional Supplements (CARNATION BREAKFAST ESSENTIALS PO) Take 1 packet by mouth daily.     . prochlorperazine (COMPAZINE) 10 MG tablet Take 1 tablet (10 mg total) by mouth every 6 (six) hours as needed for nausea or vomiting. (Patient not taking: Reported on 07/06/2015) 30 tablet 0  . Tiotropium Bromide-Olodaterol (STIOLTO RESPIMAT) 2.5-2.5 MCG/ACT AERS Inhale 2 puffs into the lungs daily. 1 Inhaler 5   No current facility-administered medications for this visit.     SURGICAL HISTORY:  Past Surgical History:  Procedure Laterality Date  . AMPUTATION Right 05/21/2012   Procedure: AMPUTATION BELOW KNEE;  Surgeon: Newt Minion, MD;  Location: [facility]  OR;  Service: Orthopedics;  Laterality: Right;  . AMPUTATION Right 06/21/2012   Procedure: Revision AMPUTATION BELOW KNEE Right;  Surgeon: Newt Minion, MD;  Location: [facility];  Service: Orthopedics;  Laterality: Right;  Revision right Below Knee Amputation  . COLONOSCOPY W/ BIOPSIES AND POLYPECTOMY    . FOOT AMPUTATION Right    traumatic right lower extremity  . UPPER  JAW     SURGERY FOR INFECTION    REVIEW OF SYSTEMS:  A comprehensive review of systems was negative.   PHYSICAL EXAMINATION: General appearance: alert, cooperative and no distress Head: Normocephalic, without obvious abnormality, atraumatic Neck: no adenopathy Lymph nodes: Cervical, supraclavicular, and axillary nodes normal. Resp: clear to auscultation bilaterally Back: symmetric, no curvature. ROM normal. No CVA tenderness. Cardio: regular rate and rhythm, S1, S2 normal, no murmur, click, rub or gallop GI: soft, non-tender; bowel sounds normal; no masses,  no organomegaly Extremities: extremities normal, atraumatic, no cyanosis or edema  ECOG PERFORMANCE STATUS: 0 - Asymptomatic  Blood pressure (!) 142/89, pulse 87, temperature 97.7 F (36.5 C), temperature source Oral, resp. rate 20, height 5' 5.5" (1.664 m), weight 129 lb 4.8 oz (58.7 kg), SpO2 99 %.  LABORATORY DATA: Lab Results  Component Value Date   WBC 4.2 07/16/2016   HGB 14.6 07/16/2016   HCT 44.7 07/16/2016   MCV 89.0 07/16/2016   PLT 367 07/16/2016      Chemistry      Component Value Date/Time   NA 141 07/16/2016 1220   K 4.0 07/16/2016 1220   CL 101 07/24/2013 1454   CL 104 07/19/2012 1432   CO2 24 07/16/2016 1220   BUN 8.8 07/16/2016 1220   CREATININE 1.0 07/16/2016 1220      Component Value Date/Time   CALCIUM 9.3 07/16/2016 1220   ALKPHOS 92 07/16/2016 1220   AST 18 07/16/2016 1220   ALT 14 07/16/2016 1220   BILITOT 0.39 07/16/2016 1220       RADIOGRAPHIC STUDIES: Ct Chest W Contrast  Result Date: 07/16/2016 CLINICAL DATA:  Stage IIIA primary bronchogenic adenocarcinoma of the right upper lobe diagnosed June 2010 status post concurrent chemoradiation therapy and consolidation chemotherapy. Stage IIIA squamous cell left upper lobe primary bronchogenic carcinoma diagnosed August 2016 status post concurrent chemoradiation therapy and consolidation chemotherapy. Patient presents for restaging on  interval observation. EXAM: CT CHEST WITH CONTRAST TECHNIQUE: Multidetector CT imaging of the chest was performed during intravenous contrast administration. CONTRAST:  1mL ISOVUE-300 IOPAMIDOL (ISOVUE-300) INJECTION 61% COMPARISON:  01/14/2016 chest CT. FINDINGS: Cardiovascular: Normal heart size. No significant pericardial fluid/thickening. Stable appearing bilateral common carotid artery stenoses (series 2/image 18 on the right and image 23 on the left). Atherosclerotic nonaneurysmal thoracic aorta. Normal caliber pulmonary arteries. No central pulmonary emboli. Mediastinum/Nodes: No discrete thyroid nodules. Unremarkable esophagus. No pathologically enlarged axillary, mediastinal or hilar lymph nodes. Lungs/Pleura: No pneumothorax. No pleural effusion. Moderate centrilobular emphysema with mild diffuse bronchial wall thickening. Stable sharply marginated consolidation with associated volume loss, bronchiectasis and distortion in the paramediastinal right upper lung, compatible with radiation fibrosis. Stable masslike fibrosis in the anteromedial left upper lobe measuring 2.7 x 1.3 cm (series 5/ image 45), with associated volume loss and distortion. No acute consolidative airspace disease or new significant pulmonary nodules. Upper abdomen: Hypodense 1.7 x 1.2 cm renal cortical lesion in the lateral lower left kidney (series 2/ image 159), not included on prior chest CT studies. Musculoskeletal: No aggressive appearing focal osseous lesions. Mild thoracic spondylosis. IMPRESSION: 1. Stable radiation changes  in the medial upper lung lobes bilaterally. No findings to suggest local tumor recurrence. 2. No evidence of metastatic disease in the chest. 3. Indeterminate hypodense 1.7 cm renal cortical lesion in the lateral lower left kidney, not included on prior chest CT studies. Recommend MRI (preferred) or CT abdomen without and with IV contrast for further characterization. Renal cell carcinoma not excluded. 4.  Stable bilateral common carotid artery stenoses. Aortic Atherosclerosis (ICD10-I70.0) and Emphysema (ICD10-J43.9). Electronically Signed   By: Ilona Sorrel M.D.   On: 07/16/2016 16:26   Mr Abdomen W Wo Contrast  Result Date: 08/03/2016 CLINICAL DATA:  Indeterminate left renal lesion on recent chest CT. Bilateral lung carcinoma . EXAM: MRI ABDOMEN WITHOUT AND WITH CONTRAST TECHNIQUE: Multiplanar multisequence MR imaging of the abdomen was performed both before and after the administration of intravenous contrast. CONTRAST:  40mL MULTIHANCE GADOBENATE DIMEGLUMINE 529 MG/ML IV SOLN COMPARISON:  Chest CT on 07/16/2016 FINDINGS: Lower chest: No acute findings. Moderate elevation of left hemidiaphragm again noted. Hepatobiliary: No masses identified. Pancreas:  No mass or inflammatory changes. Spleen:  Within normal limits in size and appearance. Adrenals/Urinary Tract: Normal adrenal glands and right kidney. A wedge-shaped area is seen in the lateral lower pole the left kidney which shows T1 and T2 hypointensity, as well as lack of contrast enhancement. This corresponds with the lesion seen on recent CT, and is consistent with chronic scarring or old infarct. No evidence of renal mass or hydronephrosis. Stomach/Bowel: Visualized portions within the abdomen are unremarkable. Vascular/Lymphatic: No pathologically enlarged lymph nodes identified. No abdominal aortic aneurysm. Other:  None. Musculoskeletal:  No suspicious bone lesions identified. 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 No evidence of renal neoplasm or other significant abnormality. Electronically Signed   By: Earle Gell M.D.   On: 08/03/2016 13:44    ASSESSMENT AND PLAN:  This is a very pleasant 66 years old African-American male with history of stage IIIa non-small cell lung cancer of the right upper lobe status post concurrent chemoradiation followed by consolidation chemotherapy  completed in May 2010. The patient has disease recurrence on the left side presented as a stage IIIa non-small cell lung cancer, squamous cell carcinoma status post concurrent chemoradiation followed by consolidation chemotherapy. Completed in March 2017. He is currently on observation and feeling fine but imaging studies of the chest few weeks ago showed suspicious lesion in the left kidney. The patient had MRI of the abdomen and it showed left renal parenchymal scarring or old infarct was no evidence of renal neoplasm or other significant abnormalities. I discussed the results with the patient and his daughter. I recommended for him to continue on observation with repeat CT scan of the chest in 6 months for restaging of his disease. He was advised to call immediately if he has any concerning symptoms in the interval. All questions were answered. The patient knows to call the clinic with any problems, questions or concerns. We can certainly see the patient much sooner if necessary. I spent 10 minutes counseling the patient face to face. The total time spent in the appointment was 15 minutes.  Disclaimer: This note was dictated with voice recognition software. Similar sounding words can inadvertently be transcribed and may not be corrected upon review.

## 2016-08-09 NOTE — Telephone Encounter (Signed)
Gv pt appts for 02/07/17 + 02/09/17. Advised Radiology will call with scan appt.

## 2016-12-20 ENCOUNTER — Encounter (INDEPENDENT_AMBULATORY_CARE_PROVIDER_SITE_OTHER): Payer: Self-pay | Admitting: Orthopedic Surgery

## 2016-12-20 ENCOUNTER — Ambulatory Visit (INDEPENDENT_AMBULATORY_CARE_PROVIDER_SITE_OTHER): Payer: Medicare PPO | Admitting: Orthopedic Surgery

## 2016-12-20 VITALS — Ht 65.0 in | Wt 129.0 lb

## 2016-12-20 DIAGNOSIS — Z89511 Acquired absence of right leg below knee: Secondary | ICD-10-CM

## 2016-12-20 NOTE — Progress Notes (Signed)
Office Visit Note   Patient: Evan [name]           Date of Birth: [birth year]           MRN: [id number] Visit Date: 12/20/2016              Requested by: No referring provider defined for this encounter. PCP: Patient, No Pcp Per  Chief Complaint  Patient presents with  . Right Leg - Follow-up    Right BKA 06/21/12      HPI: Presents in follow-up for right transtibial amputation.  Patient has lost residual volume he does not have lateral rotation stability has episodes of his leg giving way due to the leg twisting in the socket.  Patient states that due to his new antalgic gait he also has some lower back pain.  Assessment & Plan: Visit Diagnoses:  1. S/P BKA (below knee amputation) unilateral, right ([place])     Plan: Provided for a biotech for a note K3 level prosthesis with socket liner foot and ankle as well as new extra-depth shoes and custom orthotics for the left foot.  Discussed that with improved gait his back symptoms should resolve.  If they do not we would have to consider radiographs and possible MRI scan.  Follow-Up Instructions: Return if symptoms worsen or fail to improve.   Ortho Exam  Patient is alert, oriented, no adenopathy, well-dressed, normal affect, normal respiratory effort. .  He does not have rotational stability for his residual limb he has lost volume there is no redness no cellulitis no open ulcers no signs of infection.  He has a negative straight leg raise bilaterally no focal motor weakness.  No radicular symptoms.  Imaging: No results found. No images are attached to the encounter.  Labs: No results found for: HGBA1C, ESRSEDRATE, CRP, LABURIC, REPTSTATUS, GRAMSTAIN, CULT, LABORGA  Orders:  No orders of the defined types were placed in this encounter.  No orders of the defined types were placed in this encounter.    Procedures: No procedures performed  Clinical Data: No additional findings.  ROS:  All other systems negative,  except as noted in the HPI. Review of Systems  Objective: Vital Signs: Ht 5\' 5"  (1.651 m)   Wt 129 lb (58.5 kg)   BMI 21.47 kg/m   Specialty Comments:  No specialty comments available.  PMFS History: Patient Active Problem List   Diagnosis Date Noted  . Left renal mass 07/19/2016  . Phlebitis 02/04/2015  . Full code status 11/04/2014  . Encounter for antineoplastic chemotherapy 11/04/2014  . 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 TOBACCO ABUSE 04/02/2008  . 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 HYPERLIPIDEMIA 02/09/2008  . DEPRESSION 02/09/2008  . HYPERTENSION 02/09/2008  . COPD (chronic obstructive pulmonary disease) ([place]) 02/09/2008  . HEADACHE 02/09/2008   Past Medical History:  Diagnosis Date  . Anxiety   . Complication of anesthesia    " sometimes I wake up during surgery "  . Depression   . Left renal mass 07/19/2016  . Lung cancer ([place])    non small cell lung cancer    Family History  Problem Relation Age of Onset  . Heart disease Mother   . Diabetes Mother   . Prostate cancer Father     Past Surgical History:  Procedure Laterality Date  . AMPUTATION BELOW KNEE Right  05/21/2012   Performed by Newt Minion, MD at [facility]    . FOOT AMPUTATION Right    traumatic right lower extremity  . Revision AMPUTATION BELOW KNEE Right Right 06/21/2012   Performed by Newt Minion, MD at [facility]  . UPPER JAW     SURGERY FOR INFECTION   Social History   Occupational History  . Not on file  Tobacco Use  . Smoking status: Former Smoker    Packs/day: 0.50    Years: 45.00    Pack years: 22.50    Types: Cigarettes    Last attempt to quit: 06/21/2014    Years since quitting: 2.5  . Smokeless tobacco: Never Used  Substance and Sexual Activity  . Alcohol use: No    Alcohol/week: 0.0 oz    Comment: rare  .  Drug use: No  . Sexual activity: Not on file

## 2017-02-07 ENCOUNTER — Inpatient Hospital Stay: Payer: Medicare PPO | Attending: Internal Medicine

## 2017-02-07 ENCOUNTER — Ambulatory Visit (HOSPITAL_COMMUNITY)
Admission: RE | Admit: 2017-02-07 | Discharge: 2017-02-07 | Disposition: A | Discharge status: Home / Self Care | Payer: Medicare PPO | Source: Ambulatory Visit | Attending: Internal Medicine | Admitting: Internal Medicine

## 2017-02-07 ENCOUNTER — Other Ambulatory Visit: Payer: Self-pay | Admitting: *Deleted

## 2017-02-07 ENCOUNTER — Encounter (HOSPITAL_COMMUNITY): Payer: Self-pay

## 2017-02-07 DIAGNOSIS — C3412 Malignant neoplasm of upper lobe, left bronchus or lung: Secondary | ICD-10-CM

## 2017-02-07 DIAGNOSIS — Z85118 Personal history of other malignant neoplasm of bronchus and lung: Secondary | ICD-10-CM | POA: Diagnosis not present

## 2017-02-07 DIAGNOSIS — N2889 Other specified disorders of kidney and ureter: Secondary | ICD-10-CM

## 2017-02-07 DIAGNOSIS — Z89511 Acquired absence of right leg below knee: Secondary | ICD-10-CM | POA: Diagnosis not present

## 2017-02-07 DIAGNOSIS — I6523 Occlusion and stenosis of bilateral carotid arteries: Secondary | ICD-10-CM | POA: Insufficient documentation

## 2017-02-07 DIAGNOSIS — Z87891 Personal history of nicotine dependence: Secondary | ICD-10-CM | POA: Insufficient documentation

## 2017-02-07 DIAGNOSIS — J439 Emphysema, unspecified: Secondary | ICD-10-CM | POA: Diagnosis not present

## 2017-02-07 DIAGNOSIS — I288 Other diseases of pulmonary vessels: Secondary | ICD-10-CM | POA: Insufficient documentation

## 2017-02-07 DIAGNOSIS — C3411 Malignant neoplasm of upper lobe, right bronchus or lung: Secondary | ICD-10-CM

## 2017-02-07 DIAGNOSIS — I700 Atherosclerosis of aorta: Secondary | ICD-10-CM | POA: Insufficient documentation

## 2017-02-07 DIAGNOSIS — Z9889 Other specified postprocedural states: Secondary | ICD-10-CM | POA: Diagnosis not present

## 2017-02-07 LAB — COMPREHENSIVE METABOLIC PANEL
ALT: 16 U/L (ref 0–55)
AST: 16 U/L (ref 5–34)
Albumin: 3.9 g/dL (ref 3.5–5.0)
Alkaline Phosphatase: 93 U/L (ref 40–150)
Anion gap: 8 (ref 3–11)
BUN: 16 mg/dL (ref 7–26)
CHLORIDE: 105 mmol/L (ref 98–109)
CO2: 27 mmol/L (ref 22–29)
Calcium: 9.6 mg/dL (ref 8.4–10.4)
Creatinine, Ser: 1.15 mg/dL (ref 0.70–1.30)
GFR calc Af Amer: >60 mL/min (ref >60)
Glucose, Bld: 75 mg/dL (ref 70–140)
POTASSIUM: 4.6 mmol/L (ref 3.5–5.1)
Sodium: 140 mmol/L (ref 136–145)
Total Bilirubin: 0.3 mg/dL (ref 0.2–1.2)
Total Protein: 7.6 g/dL (ref 6.4–8.3)

## 2017-02-07 LAB — CBC WITH DIFFERENTIAL/PLATELET
ABS GRANULOCYTE: 2.6 10*3/uL (ref 1.5–6.5)
Basophils Absolute: 0.1 10*3/uL (ref 0.0–0.1)
Basophils Relative: 1 %
Eosinophils Absolute: 0.8 10*3/uL — ABNORMAL HIGH (ref 0.0–0.5)
Eosinophils Relative: 14 %
HEMATOCRIT: 45.7 % (ref 38.4–49.9)
Hemoglobin: 14.7 g/dL (ref 13.0–17.1)
LYMPHS PCT: 29 %
Lymphs Abs: 1.6 10*3/uL (ref 0.9–3.3)
MCH: 29.1 pg (ref 27.2–33.4)
MCHC: 32.2 g/dL (ref 32.0–36.0)
MCV: 90.4 fL (ref 79.3–98.0)
Monocytes Absolute: 0.4 10*3/uL (ref 0.1–0.9)
Monocytes Relative: 8 %
NEUTROS ABS: 2.6 10*3/uL (ref 1.5–6.5)
Neutrophils Relative %: 48 %
Platelets: 352 10*3/uL (ref 140–400)
RBC: 5.06 MIL/uL (ref 4.20–5.82)
RDW: 14.3 % (ref 11.0–15.6)
WBC: 5.5 10*3/uL (ref 4.0–10.3)

## 2017-02-07 MED ORDER — IOPAMIDOL (ISOVUE-300) INJECTION 61%
75.0000 mL | Freq: Once | INTRAVENOUS | Status: AC | PRN
  Administered 2017-02-07: 75 mL via INTRAVENOUS

## 2017-02-07 MED ORDER — IOPAMIDOL (ISOVUE-300) INJECTION 61%
INTRAVENOUS | Status: AC
  Filled 2017-02-07: qty 75

## 2017-02-08 ENCOUNTER — Ambulatory Visit: Payer: Medicare PPO | Admitting: Internal Medicine

## 2017-02-09 ENCOUNTER — Ambulatory Visit: Payer: Medicare PPO | Admitting: Oncology

## 2017-02-09 ENCOUNTER — Ambulatory Visit: Payer: Medicare PPO | Admitting: Internal Medicine

## 2017-02-10 ENCOUNTER — Telehealth: Payer: Self-pay

## 2017-02-10 ENCOUNTER — Other Ambulatory Visit: Payer: Self-pay | Admitting: *Deleted

## 2017-02-10 ENCOUNTER — Other Ambulatory Visit: Payer: Self-pay | Admitting: Internal Medicine

## 2017-02-10 DIAGNOSIS — I6523 Occlusion and stenosis of bilateral carotid arteries: Secondary | ICD-10-CM

## 2017-02-10 NOTE — Telephone Encounter (Signed)
Called patient 02/09/17 at 1:00pm to make him aware he missed his 12:20 appointment with Dr. Mickeal Skinner. Patient stated he did not remember he had an appointment. Informed patient that he could still be seen and he declined stating he was cleaning and could not come. Patient stated he will reschedule appointment. Dr. Mickeal Skinner and Erasmo Downer, NP made aware. Scheduling message sent.

## 2017-02-11 ENCOUNTER — Telehealth: Payer: Self-pay | Admitting: Internal Medicine

## 2017-02-11 NOTE — Telephone Encounter (Signed)
Spoke to patient regarding upcoming January appointments per 1/9 sch message.

## 2017-02-15 ENCOUNTER — Other Ambulatory Visit: Payer: Self-pay | Admitting: *Deleted

## 2017-02-16 ENCOUNTER — Other Ambulatory Visit: Payer: Medicare PPO

## 2017-02-16 ENCOUNTER — Ambulatory Visit (HOSPITAL_COMMUNITY): Payer: Medicare PPO

## 2017-02-16 ENCOUNTER — Other Ambulatory Visit: Payer: Self-pay | Admitting: *Deleted

## 2017-02-16 ENCOUNTER — Ambulatory Visit (HOSPITAL_COMMUNITY)
Admission: RE | Admit: 2017-02-16 | Discharge: 2017-02-16 | Disposition: A | Discharge status: Home / Self Care | Payer: Medicare PPO | Source: Ambulatory Visit | Attending: Internal Medicine | Admitting: Internal Medicine

## 2017-02-16 ENCOUNTER — Inpatient Hospital Stay: Payer: Medicare PPO

## 2017-02-16 DIAGNOSIS — I6523 Occlusion and stenosis of bilateral carotid arteries: Secondary | ICD-10-CM

## 2017-02-16 DIAGNOSIS — Y842 Radiological procedure and radiotherapy as the cause of abnormal reaction of the patient, or of later complication, without mention of misadventure at the time of the procedure: Secondary | ICD-10-CM | POA: Diagnosis not present

## 2017-02-16 DIAGNOSIS — J701 Chronic and other pulmonary manifestations due to radiation: Secondary | ICD-10-CM | POA: Insufficient documentation

## 2017-02-16 DIAGNOSIS — C3490 Malignant neoplasm of unspecified part of unspecified bronchus or lung: Secondary | ICD-10-CM

## 2017-02-16 LAB — COMPREHENSIVE METABOLIC PANEL
ALBUMIN: 4 g/dL (ref 3.5–5.0)
ALT: 13 U/L — ABNORMAL LOW (ref 17–63)
AST: 17 U/L (ref 15–41)
Alkaline Phosphatase: 93 U/L (ref 38–126)
Anion gap: 7 (ref 5–15)
BILIRUBIN TOTAL: 0.6 mg/dL (ref 0.3–1.2)
BUN: 18 mg/dL (ref 6–20)
CALCIUM: 9.1 mg/dL (ref 8.9–10.3)
CHLORIDE: 104 mmol/L (ref 101–111)
CO2: 24 mmol/L (ref 22–32)
Creatinine, Ser: 1.13 mg/dL (ref 0.61–1.24)
GFR calc Af Amer: >60 mL/min (ref >60)
GFR calc non Af Amer: >60 mL/min (ref >60)
GLUCOSE: 89 mg/dL (ref 65–99)
POTASSIUM: 4 mmol/L (ref 3.5–5.1)
SODIUM: 135 mmol/L (ref 135–145)
TOTAL PROTEIN: 7.8 g/dL (ref 6.5–8.1)

## 2017-02-16 LAB — LIPID PANEL
CHOLESTEROL: 260 mg/dL — AB (ref 0–200)
HDL: 47 mg/dL (ref >40)
LDL CALC: 184 mg/dL — AB (ref 0–99)
Total CHOL/HDL Ratio: 5.5 RATIO
Triglycerides: 146 mg/dL (ref <150)
VLDL: 29 mg/dL (ref 0–40)

## 2017-02-16 MED ORDER — IOPAMIDOL (ISOVUE-370) INJECTION 76%
INTRAVENOUS | Status: AC
  Administered 2017-02-16: 100 mL
  Filled 2017-02-16: qty 100

## 2017-02-18 ENCOUNTER — Inpatient Hospital Stay (HOSPITAL_BASED_OUTPATIENT_CLINIC_OR_DEPARTMENT_OTHER): Payer: Medicare PPO | Admitting: Internal Medicine

## 2017-02-18 ENCOUNTER — Encounter: Payer: Self-pay | Admitting: Internal Medicine

## 2017-02-18 ENCOUNTER — Other Ambulatory Visit: Payer: Self-pay

## 2017-02-18 ENCOUNTER — Telehealth: Payer: Self-pay | Admitting: Internal Medicine

## 2017-02-18 VITALS — BP 121/73 | HR 66 | Temp 97.5°F | Resp 18

## 2017-02-18 DIAGNOSIS — Z89511 Acquired absence of right leg below knee: Secondary | ICD-10-CM

## 2017-02-18 DIAGNOSIS — I6523 Occlusion and stenosis of bilateral carotid arteries: Secondary | ICD-10-CM | POA: Insufficient documentation

## 2017-02-18 DIAGNOSIS — Z85118 Personal history of other malignant neoplasm of bronchus and lung: Secondary | ICD-10-CM

## 2017-02-18 DIAGNOSIS — C3412 Malignant neoplasm of upper lobe, left bronchus or lung: Secondary | ICD-10-CM

## 2017-02-18 MED ORDER — ATORVASTATIN CALCIUM 40 MG PO TABS: 40.0000 mg | ORAL_TABLET | Freq: Every day | ORAL | 5 refills | Status: DC

## 2017-02-18 MED ORDER — ASPIRIN EC 325 MG PO TBEC: 325.0000 mg | DELAYED_RELEASE_TABLET | Freq: Every day | ORAL | 5 refills | Status: DC

## 2017-02-18 NOTE — Progress Notes (Signed)
[facility] [place], [place] [name] [address] [phone]   New Patient Evaluation  Date of Service: 02/18/17 Patient Name: Evan [name] Patient MRN: [id number] Patient DOB: [birth year] Provider: Ventura Sellers, MD  Identifying Statement:  Evan [name] is a 67 y.o. male with Bilateral carotid artery stenosis [I65.23] who presents for initial consultation and evaluation regarding cancer associated neurologic deficits.    Referring Provider: No referring provider defined for this encounter.  Primary Cancer:  stage IIIA (T2a, N2, M0) non-small cell lung cancer  Oncologic History:  1. Status post concurrent chemoradiation with weekly carboplatin and paclitaxel; last dose was given April 08, 2008. 2. Status post 3 cycles of consolidation chemotherapy with docetaxel; last dose was given on Jun 26, 2008. 3. Concurrent chemoradiation with weekly carboplatin for AUC of 2 and paclitaxel 45 MG/M2. First dose expected on 09/23/2014 status post 7 cycles. Last dose was given 12/01/2014 with partial response. 4. Consolidation chemotherapy with carboplatin for AUC of 5 and paclitaxel 175 MG/M2 every 3 weeks with Neulasta support. First dose 01/02/2015. Status post 3 cycles.  History of Present Illness: The patient's records from the referring physician were obtained and reviewed and the patient interviewed to confirm this HPI.  Evan [name] presents today to review recent imaging findings.  A routine follow-up enhanced chest CT performed on 02/07/17 demonstrated incidental stenosis of bilateral carotid arteries at their origin at the aortic arch.  This was followed by dedicated neurovascular CT angio which will be reviewed today.  He denies any neurologic symptoms.  There is no history of TIA symptoms, seizures.  He is on observation for his lung cancer, although he was exposed to mediastinal radiation in both 2010 and 2016.  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  Medications: Current Outpatient Medications on File Prior to Visit  Medication Sig Dispense Refill  . albuterol (PROVENTIL HFA;VENTOLIN HFA) 108 (90 BASE) MCG/ACT inhaler Inhale 2 puffs into the lungs every 6 (six) hours as needed for wheezing or shortness of breath. 1 Inhaler 2  . ibuprofen (ADVIL,MOTRIN) 600 MG tablet Take 600 mg by mouth every 6 (six) hours as needed.    . Multiple Vitamin (MULTIVITAMIN WITH MINERALS) TABS tablet Take 1 tablet by mouth daily.    . Tiotropium Bromide-Olodaterol (STIOLTO RESPIMAT) 2.5-2.5 MCG/ACT AERS Inhale 2 puffs into the lungs daily. 1 Inhaler 5  . Nutritional Supplements (CARNATION BREAKFAST ESSENTIALS PO) Take 1 packet by mouth daily.      No current facility-administered medications on file prior to visit.     Allergies: No Known Allergies Past Medical History:  Past Medical History:  Diagnosis Date  . Anxiety   . Complication of anesthesia    " sometimes I wake up during surgery "  . Depression   . Left renal mass 07/19/2016  . Lung cancer ([place])    non small cell lung cancer   Past Surgical History:  Past Surgical History:  Procedure Laterality Date  . AMPUTATION Right 05/21/2012   Procedure: AMPUTATION BELOW KNEE;  Surgeon: Newt Minion, MD;  Location: [facility];  Service: Orthopedics;  Laterality: Right;  . AMPUTATION Right 06/21/2012   Procedure: Revision AMPUTATION BELOW KNEE Right;  Surgeon: Newt Minion, MD;  Location: [facility];  Service: Orthopedics;  Laterality: Right;  Revision right Below Knee Amputation  . COLONOSCOPY W/ BIOPSIES AND POLYPECTOMY    . FOOT AMPUTATION Right  traumatic right lower extremity  . UPPER JAW     SURGERY FOR INFECTION   Social History:  Social History   Socioeconomic History  . Marital status: Single    Spouse name: Not on file  . Number of children: Not on file  . Years of education: Not on file  . Highest education level: Not on file  Social Needs  .  Financial resource strain: Not on file  . Food insecurity - worry: Not on file  . Food insecurity - inability: Not on file  . Transportation needs - medical: Not on file  . Transportation needs - non-medical: Not on file  Occupational History  . Not on file  Tobacco Use  . Smoking status: Former Smoker    Packs/day: 0.50    Years: 45.00    Pack years: 22.50    Types: Cigarettes    Last attempt to quit: 06/21/2014    Years since quitting: 2.6  . Smokeless tobacco: Never Used  Substance and Sexual Activity  . Alcohol use: No    Alcohol/week: 0.0 oz    Comment: rare  . Drug use: No  . Sexual activity: Not on file  Other Topics Concern  . Not on file  Social History Narrative  . Not on file   Family History:  Family History  Problem Relation Age of Onset  . Heart disease Mother   . Diabetes Mother   . Prostate cancer Father     Review of Systems: Constitutional: Denies fevers, chills or abnormal weight loss Eyes: Denies blurriness of vision Ears, nose, mouth, throat, and face: Denies mucositis or sore throat Respiratory: Denies cough, dyspnea or wheezes Cardiovascular: Denies palpitation, chest discomfort or lower extremity swelling Gastrointestinal:  Denies nausea, constipation, diarrhea GU: Denies dysuria or incontinence Skin: Denies abnormal skin rashes Neurological: Per HPI Musculoskeletal: Denies joint pain, back or neck discomfort. No decrease in ROM Behavioral/Psych: Denies anxiety, disturbance in thought content, and mood instability   Physical Exam: Vitals:   02/18/17 1234  BP: 121/73  Pulse: 66  Resp: 18  Temp: (!) 97.5 F (36.4 C)  SpO2: 100%   KPS: 80. General: Alert, cooperative, pleasant, in no acute distress Head: Craniotomy scar noted, dry and intact. EENT: No conjunctival injection or scleral icterus. Oral mucosa moist Lungs: Resp effort normal Cardiac: Regular rate and rhythm Abdomen: Soft, non-distended abdomen Skin: No rashes cyanosis  or petechiae. Extremities: No clubbing or edema  Neurologic Exam: Mental Status: Awake, alert, attentive to examiner. Oriented to self and environment. Language is fluent with intact comprehension.  Cranial Nerves: Visual acuity is grossly normal. Visual fields are full. Extra-ocular movements intact. No ptosis. Face is symmetric, tongue midline. Motor: Tone and bulk are normal. Power is full in both arms and legs. Reflexes are symmetric, no pathologic reflexes present. Intact finger to nose bilaterally Sensory: Intact to light touch and temperature Gait: Normal and tandem gait is normal.   Labs: I have reviewed the data as listed    Component Value Date/Time   NA 135 02/16/2017 1517   NA 141 07/16/2016 1220   K 4.0 02/16/2017 1517   K 4.0 07/16/2016 1220   CL 104 02/16/2017 1517   CL 104 07/19/2012 1432   CO2 24 02/16/2017 1517   CO2 24 07/16/2016 1220   GLUCOSE 89 02/16/2017 1517   GLUCOSE 89 07/16/2016 1220   GLUCOSE 97 07/19/2012 1432   BUN 18 02/16/2017 1517   BUN 8.8 07/16/2016 1220   CREATININE 1.13  02/16/2017 1517   CREATININE 1.0 07/16/2016 1220   CALCIUM 9.1 02/16/2017 1517   CALCIUM 9.3 07/16/2016 1220   PROT 7.8 02/16/2017 1517   PROT 7.5 07/16/2016 1220   ALBUMIN 4.0 02/16/2017 1517   ALBUMIN 3.8 07/16/2016 1220   AST 17 02/16/2017 1517   AST 18 07/16/2016 1220   ALT 13 (L) 02/16/2017 1517   ALT 14 07/16/2016 1220   ALKPHOS 93 02/16/2017 1517   ALKPHOS 92 07/16/2016 1220   BILITOT 0.6 02/16/2017 1517   BILITOT 0.39 07/16/2016 1220   GFRNONAA >60 02/16/2017 1517   GFRAA >60 02/16/2017 1517   Lab Results  Component Value Date   WBC 5.5 02/07/2017   NEUTROABS 2.6 02/07/2017   HGB 14.7 02/07/2017   HCT 45.7 02/07/2017   MCV 90.4 02/07/2017   PLT 352 02/07/2017   Lab Results  Component Value Date   CHOL 260 (H) 02/16/2017   HDL 47 02/16/2017   LDLCALC 184 (H) 02/16/2017   TRIG 146 02/16/2017   CHOLHDL 5.5 02/16/2017    Imaging:  Ct Angio Head  W Or Wo Contrast  Result Date: 02/17/2017 CLINICAL DATA:  67 y/o M; carotid stenosis. History of lung cancer post chemo radiation. EXAM: CT ANGIOGRAPHY HEAD AND NECK TECHNIQUE: Multidetector CT imaging of the head and neck was performed using the standard protocol during bolus administration of intravenous contrast. Multiplanar CT image reconstructions and MIPs were obtained to evaluate the vascular anatomy. Carotid stenosis measurements (when applicable) are obtained utilizing NASCET criteria, using the distal internal carotid diameter as the denominator. CONTRAST:  155mL ISOVUE-370 IOPAMIDOL (ISOVUE-370) INJECTION 76% COMPARISON:  07/06/2015 CT of the head. 02/07/2017 CT chest. 08/16/2014 PET-CT. FINDINGS: CT HEAD FINDINGS Brain: No evidence of acute infarction, hemorrhage, hydrocephalus, extra-axial collection or mass lesion/mass effect. Vascular: As below. Skull: Normal. Negative for fracture or focal lesion. Sinuses: Imaged portions are clear. Orbits: No acute finding. Review of the MIP images confirms the above findings CTA NECK FINDINGS Aortic arch: Bovine variant branching. Imaged portion shows no evidence of aneurysm or dissection. No significant stenosis of the major arch vessel origins. Mild calcific atherosclerosis. Right carotid system: Long segment of severe 80% stenosis of right proximal common carotid artery. Right carotid system is otherwise widely patent without dissection, significant stenosis, or aneurysm. Left carotid system: Long segment of severe 90% stenosis of proximal left common carotid artery. Left carotid system is otherwise widely patent without dissection, significant stenosis, or aneurysm. Vertebral arteries: Left dominant. No evidence of dissection, stenosis (50% or greater) or occlusion. Right V1 and proximal V2 segments are obscured by streak artifact from contrast bolus. Skeleton: C6-7 minimal retrolisthesis. Mild cervical spondylosis with loss of disc space height greatest at  the C3-4 level. Other neck: Negative. Upper chest: Paramediastinal lung collapse likely representing radiation port fibrosis. Soft tissue and left upper lobe region of prior tumor is decreased in size from PET-CT stable from prior CT chest. Review of the MIP images confirms the above findings CTA HEAD FINDINGS Anterior circulation: No significant stenosis, proximal occlusion, aneurysm, or vascular malformation. Mild calcific atherosclerosis of carotid siphons with minimal less than 30% stenosis. Posterior circulation: No significant stenosis, proximal occlusion, aneurysm, or vascular malformation. Venous sinuses: As permitted by contrast timing, patent. Anatomic variants: Patent bilateral posterior communicating arteries and anterior communicating artery. Delayed phase: No abnormal intracranial enhancement. Review of the MIP images confirms the above findings IMPRESSION: 1. 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 Probable  sequelae of radiation vasculopathy if lower neck was included in the radiation port. 2. Otherwise patent carotid and vertebral arteries of the neck. No dissection, aneurysm, or additional segment of high-grade stenosis by NASCET criteria. 3. Patent anterior and posterior intracranial circulation. No large vessel occlusion, aneurysm, or significant stenosis. 4. No acute intracranial process or abnormal enhancement of the brain. 5. Paramediastinal radiation fibrosis of upper lungs. Soft tissue in left upper lobe corresponding to region of prior tumor is stable from prior chest CT. Electronically Signed   By: Kristine Garbe M.D.   On: 02/17/2017 04:02   Ct Angio Neck W Or Wo Contrast  Result Date: 02/17/2017 CLINICAL DATA:  67 y/o M; carotid stenosis. History of lung cancer post chemo radiation. EXAM: CT ANGIOGRAPHY HEAD AND NECK TECHNIQUE: Multidetector CT imaging of the head and neck was performed using the standard protocol during  bolus administration of intravenous contrast. Multiplanar CT image reconstructions and MIPs were obtained to evaluate the vascular anatomy. Carotid stenosis measurements (when applicable) are obtained utilizing NASCET criteria, using the distal internal carotid diameter as the denominator. CONTRAST:  166mL ISOVUE-370 IOPAMIDOL (ISOVUE-370) INJECTION 76% COMPARISON:  07/06/2015 CT of the head. 02/07/2017 CT chest. 08/16/2014 PET-CT. FINDINGS: CT HEAD FINDINGS Brain: No evidence of acute infarction, hemorrhage, hydrocephalus, extra-axial collection or mass lesion/mass effect. Vascular: As below. Skull: Normal. Negative for fracture or focal lesion. Sinuses: Imaged portions are clear. Orbits: No acute finding. Review of the MIP images confirms the above findings CTA NECK FINDINGS Aortic arch: Bovine variant branching. Imaged portion shows no evidence of aneurysm or dissection. No significant stenosis of the major arch vessel origins. Mild calcific atherosclerosis. Right carotid system: Long segment of severe 80% stenosis of right proximal common carotid artery. Right carotid system is otherwise widely patent without dissection, significant stenosis, or aneurysm. Left carotid system: Long segment of severe 90% stenosis of proximal left common carotid artery. Left carotid system is otherwise widely patent without dissection, significant stenosis, or aneurysm. Vertebral arteries: Left dominant. No evidence of dissection, stenosis (50% or greater) or occlusion. Right V1 and proximal V2 segments are obscured by streak artifact from contrast bolus. Skeleton: C6-7 minimal retrolisthesis. Mild cervical spondylosis with loss of disc space height greatest at the C3-4 level. Other neck: Negative. Upper chest: Paramediastinal lung collapse likely representing radiation port fibrosis. Soft tissue and left upper lobe region of prior tumor is decreased in size from PET-CT stable from prior CT chest. Review of the MIP images confirms  the above findings CTA HEAD FINDINGS Anterior circulation: No significant stenosis, proximal occlusion, aneurysm, or vascular malformation. Mild calcific atherosclerosis of carotid siphons with minimal less than 30% stenosis. Posterior circulation: No significant stenosis, proximal occlusion, aneurysm, or vascular malformation. Venous sinuses: As permitted by contrast timing, patent. Anatomic variants: Patent bilateral posterior communicating arteries and anterior communicating artery. Delayed phase: No abnormal intracranial enhancement. Review of the MIP images confirms the above findings IMPRESSION: 1. 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 Probable sequelae of radiation vasculopathy if lower neck was included in the radiation port. 2. Otherwise patent carotid and vertebral arteries of the neck. No dissection, aneurysm, or additional segment of high-grade stenosis by NASCET criteria. 3. Patent anterior and posterior intracranial circulation. No large vessel occlusion, aneurysm, or significant stenosis. 4. No acute intracranial process or abnormal enhancement of the brain. 5. Paramediastinal radiation fibrosis of upper lungs. Soft tissue in left upper lobe corresponding to region of prior tumor is  stable from prior chest CT. Electronically Signed   By: Kristine Garbe M.D.   On: 02/17/2017 04:02   Ct Chest W Contrast  Result Date: 02/07/2017 CLINICAL DATA:  Right lung adenocarcinoma 2010, left squamous cell lung cancer 2016, prior chemotherapy and radiation therapy. Cough, shortness of breath, and chest pain for 1 month. EXAM: CT CHEST WITH CONTRAST TECHNIQUE: Multidetector CT imaging of the chest was performed during intravenous contrast administration. CONTRAST:  17mL ISOVUE-300 IOPAMIDOL (ISOVUE-300) INJECTION 61% COMPARISON:  07/16/2016 FINDINGS: Cardiovascular: Severe luminal narrowing of both proximal common carotid arteries, significantly  worsened on the left compared to the prior exam narrow left vertebral artery and questionably occluded right vertebral artery Atherosclerotic calcification of the aortic arch. Mediastinum/Nodes: Stable soft tissue density in the AP window adjacent to a region of treated tumor in the left upper lobe. No significant progression in this disease to suggest active process. Lungs/Pleura: Stable right paramediastinal and perihilar densities favoring radiation pneumonitis and chronic atelectasis. This is primarily in the right upper lobe although with components in the medial portion of the right lower lobe. Mildly elevated left hemidiaphragm with adjacent atelectasis. Therapy related findings along the left paramediastinal border with stable residual soft tissue density in this region measuring 1.3 cm in thickness. Centrilobular emphysema. Upper Abdomen: Mild prominence of stool in the visualized part of the colon. Stable appearance of scarring in the left mid kidney. Musculoskeletal: Unremarkable IMPRESSION: 1. 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 The patient is at high risk for brain hypoperfusion or stroke. Neurology consultation recommended, also considered CT angiography of the neck for definitive characterization. The carotid stenoses are more cephalad than I would expect for a radiation port unless the patient previously received radiation to the lower neck. 2. Stable post therapy related findings in the right paramediastinal and perihilar regions and in the left paramediastinal region. No growth to suggest active tumor at this time. 3. Other imaging findings of potential clinical significance: Aortic Atherosclerosis (ICD10-I70.0) and Emphysema (ICD10-J43.9). Stable scarring in the left mid kidney. These results will  be called to the ordering clinician or representative by the Radiologist Assistant, and communication documented in the PACS or zVision Dashboard. Electronically Signed   By: Van Clines M.D.   On: 02/07/2017 15:21    Assessment/Plan 1. 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  In addition, there are stenoses of both vertebral arteries.  These changes are apparent over time on review of contrast enhanced chest CT studies over the past two years.  He is at very high risk for stroke, although he is presently without clinical or radiographic signs of stroke.   At this time we recommend initiating full dose ASA and high dose statin (Lipitor 40mg  daily).  Given the complexity of this case and the need for multidisciplinary neurovascular review, referral will be placed for Dr. Larwance Rote at [facility].  It is possible that surgical or minimally invasive intervention could be considered in lieu of medical therapy alone.    Extensive counseling was delivered regarding secondary stroke prevention, including reducing smoking, healthy diet and exercise.  Blood pressure control should be done cautiously in a patient with this degree of neurovascular disease burden.  We will continue to follow along closely with Evan [name], and anticipate hearing more input from colleagues on this case.   We spent twenty additional minutes teaching regarding the natural history, biology, and historical experience in the treatment of neurologic complications of cancer. We also provided teaching sheets for the patient to take home as an additional resource.  We appreciate the opportunity to participate in the care of Evan [name].   All questions were answered. The patient knows to call the clinic with any problems, questions or concerns. No  barriers to learning were detected.  The total time spent in the encounter was 60 minutes and more than 50% was on counseling and review of test results   Ventura Sellers, MD Medical Director of Neuro-Oncology [facility] [place] 02/18/17 12:56 PM

## 2017-02-18 NOTE — Telephone Encounter (Signed)
Per 1/18 no los

## 2017-02-22 ENCOUNTER — Other Ambulatory Visit: Payer: Self-pay | Admitting: *Deleted

## 2017-02-22 ENCOUNTER — Encounter: Payer: Self-pay | Admitting: *Deleted

## 2017-02-22 DIAGNOSIS — I6523 Occlusion and stenosis of bilateral carotid arteries: Secondary | ICD-10-CM

## 2017-02-22 NOTE — Progress Notes (Signed)
Referral for [facility] sent on 02/18/2017.  Appt scheduled for April 4 @ 830 earliest with Dr. Audrea Muscat [place].  They will send new patient paperwork to patient to advise.

## 2017-02-23 ENCOUNTER — Other Ambulatory Visit: Payer: Self-pay

## 2017-02-23 ENCOUNTER — Telehealth: Payer: Self-pay | Admitting: Internal Medicine

## 2017-02-23 ENCOUNTER — Inpatient Hospital Stay (HOSPITAL_BASED_OUTPATIENT_CLINIC_OR_DEPARTMENT_OTHER): Payer: Medicare PPO | Admitting: Oncology

## 2017-02-23 ENCOUNTER — Encounter: Payer: Self-pay | Admitting: Oncology

## 2017-02-23 VITALS — BP 121/80 | HR 81 | Resp 18 | Ht 65.0 in | Wt 131.6 lb

## 2017-02-23 DIAGNOSIS — C3411 Malignant neoplasm of upper lobe, right bronchus or lung: Secondary | ICD-10-CM

## 2017-02-23 DIAGNOSIS — Z85118 Personal history of other malignant neoplasm of bronchus and lung: Secondary | ICD-10-CM

## 2017-02-23 DIAGNOSIS — I6523 Occlusion and stenosis of bilateral carotid arteries: Secondary | ICD-10-CM | POA: Diagnosis not present

## 2017-02-23 DIAGNOSIS — C3412 Malignant neoplasm of upper lobe, left bronchus or lung: Secondary | ICD-10-CM | POA: Diagnosis not present

## 2017-02-23 NOTE — Telephone Encounter (Signed)
Gave avs and calendar for july °

## 2017-02-23 NOTE — Progress Notes (Signed)
[place] OFFICE PROGRESS NOTE  Patient, No Pcp Per No address on file  DIAGNOSIS:  1) stage IIIA (T2a, N2, M0) non-small cell lung cancer, squamous cell carcinoma of the left upper lobe diagnosed in August 2016 2)  Stage IIIA (T2 N2 Mx) non-small cell lung cancer, adenocarcinoma, involving the right upper lobe and mediastinal lymphadenopathy diagnosed in June 2010.   PRIOR THERAPY: 1. Status post concurrent chemoradiation with weekly carboplatin and paclitaxel; last dose was given April 08, 2008. 2. Status post 3 cycles of consolidation chemotherapy with docetaxel; last dose was given on Jun 26, 2008. 3. Concurrent chemoradiation with weekly carboplatin for AUC of 2 and paclitaxel 45 MG/M2. First dose expected on 09/23/2014 status post 7 cycles. Last dose was given 12/01/2014 with partial response. 4. Consolidation chemotherapy with carboplatin for AUC of 5 and paclitaxel 175 MG/M2 every 3 weeks with Neulasta support. First dose 01/02/2015. Status post 3 cycles.  CURRENT THERAPY: Observation.   INTERVAL HISTORY: [name] 67 y.o. male returns for routine follow-up visit by himself.  The patient is feeling fine today has no specific complaints.  Patient denies fevers and chills.  Denies chest pain, shortness breath, cough, hemoptysis.  Denies nausea, vomiting, constipation, diarrhea.  He denies any significant weight loss or night sweats.  The patient had a recent restaging CT scan of the chest and is here to discuss the results.  MEDICAL HISTORY: Past Medical History:  Diagnosis Date  . Anxiety   . Complication of anesthesia    " sometimes I wake up during surgery "  . Depression   . Left renal mass 07/19/2016  . Lung cancer ([place])    non small cell lung cancer    ALLERGIES:  has No Known Allergies.  MEDICATIONS:  Current Outpatient Medications  Medication Sig Dispense Refill  . albuterol (PROVENTIL HFA;VENTOLIN HFA) 108 (90 BASE) MCG/ACT inhaler Inhale 2  puffs into the lungs every 6 (six) hours as needed for wheezing or shortness of breath. 1 Inhaler 2  . aspirin EC 325 MG tablet Take 1 tablet (325 mg total) by mouth daily. 30 tablet 5  . atorvastatin (LIPITOR) 40 MG tablet Take 1 tablet (40 mg total) by mouth daily. 30 tablet 5  . ibuprofen (ADVIL,MOTRIN) 600 MG tablet Take 600 mg by mouth every 6 (six) hours as needed.    . Multiple Vitamin (MULTIVITAMIN WITH MINERALS) TABS tablet Take 1 tablet by mouth daily.    . Nutritional Supplements (CARNATION BREAKFAST ESSENTIALS PO) Take 1 packet by mouth daily.     . Tiotropium Bromide-Olodaterol (STIOLTO RESPIMAT) 2.5-2.5 MCG/ACT AERS Inhale 2 puffs into the lungs daily. 1 Inhaler 5   No current facility-administered medications for this visit.     SURGICAL HISTORY:  Past Surgical History:  Procedure Laterality Date  . AMPUTATION Right 05/21/2012   Procedure: AMPUTATION BELOW KNEE;  Surgeon: Newt Minion, MD;  Location: [facility];  Service: Orthopedics;  Laterality: Right;  . AMPUTATION Right 06/21/2012   Procedure: Revision AMPUTATION BELOW KNEE Right;  Surgeon: Newt Minion, MD;  Location: [facility];  Service: Orthopedics;  Laterality: Right;  Revision right Below Knee Amputation  . COLONOSCOPY W/ BIOPSIES AND POLYPECTOMY    . FOOT AMPUTATION Right    traumatic right lower extremity  . UPPER JAW     SURGERY FOR INFECTION    REVIEW OF SYSTEMS:   Review of Systems  Constitutional: Negative for appetite change, chills, fatigue, fever and unexpected weight change.  HENT:   Negative for mouth sores, nosebleeds, sore throat and trouble swallowing.   Eyes: Negative for eye problems and icterus.  Respiratory: Negative for cough, hemoptysis, shortness of breath and wheezing.   Cardiovascular: Negative for chest pain and leg swelling.  Gastrointestinal: Negative for abdominal pain, constipation, diarrhea, nausea and vomiting.  Genitourinary: Negative for bladder incontinence, difficulty urinating,  dysuria, frequency and hematuria.   Musculoskeletal: Negative for back pain, gait problem, neck pain and neck stiffness.  Skin: Negative for itching and rash.  Neurological: Negative for dizziness, extremity weakness, gait problem, headaches, light-headedness and seizures.  Hematological: Negative for adenopathy. Does not bruise/bleed easily.  Psychiatric/Behavioral: Negative for confusion, depression and sleep disturbance. The patient is not nervous/anxious.     PHYSICAL EXAMINATION:  Blood pressure 121/80, pulse 81, resp. rate 18, height 5\' 5"  (1.651 m), weight 131 lb 9.6 oz (59.7 kg), SpO2 100 %.  ECOG PERFORMANCE STATUS: 0 - Asymptomatic  Physical Exam  Constitutional: Oriented to person, place, and time and well-developed, well-nourished, and in no distress. No distress.  HENT:  Head: Normocephalic and atraumatic.  Mouth/Throat: Oropharynx is clear and moist. No oropharyngeal exudate.  Eyes: Conjunctivae are normal. Right eye exhibits no discharge. Left eye exhibits no discharge. No scleral icterus.  Neck: Normal range of motion. Neck supple.  Cardiovascular: Normal rate, regular rhythm, normal heart sounds and intact distal pulses.   Pulmonary/Chest: Effort normal and breath sounds normal. No respiratory distress. No wheezes. No rales.  Abdominal: Soft. Bowel sounds are normal. Exhibits no distension and no mass. There is no tenderness.  Musculoskeletal: Normal range of motion. Exhibits no edema. Right below the knee amputation. Lymphadenopathy:    No cervical adenopathy.  Neurological: Alert and oriented to person, place, and time. Exhibits normal muscle tone. Gait normal. Coordination normal.  Skin: Skin is warm and dry. No rash noted. Not diaphoretic. No erythema. No pallor.  Psychiatric: Mood, memory and judgment normal.  Vitals reviewed.  LABORATORY DATA: Lab Results  Component Value Date   WBC 5.5 02/07/2017   HGB 14.7 02/07/2017   HCT 45.7 02/07/2017   MCV 90.4  02/07/2017   PLT 352 02/07/2017      Chemistry      Component Value Date/Time   NA 135 02/16/2017 1517   NA 141 07/16/2016 1220   K 4.0 02/16/2017 1517   K 4.0 07/16/2016 1220   CL 104 02/16/2017 1517   CL 104 07/19/2012 1432   CO2 24 02/16/2017 1517   CO2 24 07/16/2016 1220   BUN 18 02/16/2017 1517   BUN 8.8 07/16/2016 1220   CREATININE 1.13 02/16/2017 1517   CREATININE 1.0 07/16/2016 1220      Component Value Date/Time   CALCIUM 9.1 02/16/2017 1517   CALCIUM 9.3 07/16/2016 1220   ALKPHOS 93 02/16/2017 1517   ALKPHOS 92 07/16/2016 1220   AST 17 02/16/2017 1517   AST 18 07/16/2016 1220   ALT 13 (L) 02/16/2017 1517   ALT 14 07/16/2016 1220   BILITOT 0.6 02/16/2017 1517   BILITOT 0.39 07/16/2016 1220       RADIOGRAPHIC STUDIES:  Ct Angio Head W Or Wo Contrast  Result Date: 02/17/2017 CLINICAL DATA:  67 y/o M; carotid stenosis. History of lung cancer post chemo radiation. EXAM: CT ANGIOGRAPHY HEAD AND NECK TECHNIQUE: Multidetector CT imaging of the head and neck was performed using the standard protocol during bolus administration of intravenous contrast. Multiplanar CT image reconstructions and MIPs were obtained to evaluate the vascular anatomy.  Carotid stenosis measurements (when applicable) are obtained utilizing NASCET criteria, using the distal internal carotid diameter as the denominator. CONTRAST:  157mL ISOVUE-370 IOPAMIDOL (ISOVUE-370) INJECTION 76% COMPARISON:  07/06/2015 CT of the head. 02/07/2017 CT chest. 08/16/2014 PET-CT. FINDINGS: CT HEAD FINDINGS Brain: No evidence of acute infarction, hemorrhage, hydrocephalus, extra-axial collection or mass lesion/mass effect. Vascular: As below. Skull: Normal. Negative for fracture or focal lesion. Sinuses: Imaged portions are clear. Orbits: No acute finding. Review of the MIP images confirms the above findings CTA NECK FINDINGS Aortic arch: Bovine variant branching. Imaged portion shows no evidence of aneurysm or dissection.  No significant stenosis of the major arch vessel origins. Mild calcific atherosclerosis. Right carotid system: Long segment of severe 80% stenosis of right proximal common carotid artery. Right carotid system is otherwise widely patent without dissection, significant stenosis, or aneurysm. Left carotid system: Long segment of severe 90% stenosis of proximal left common carotid artery. Left carotid system is otherwise widely patent without dissection, significant stenosis, or aneurysm. Vertebral arteries: Left dominant. No evidence of dissection, stenosis (50% or greater) or occlusion. Right V1 and proximal V2 segments are obscured by streak artifact from contrast bolus. Skeleton: C6-7 minimal retrolisthesis. Mild cervical spondylosis with loss of disc space height greatest at the C3-4 level. Other neck: Negative. Upper chest: Paramediastinal lung collapse likely representing radiation port fibrosis. Soft tissue and left upper lobe region of prior tumor is decreased in size from PET-CT stable from prior CT chest. Review of the MIP images confirms the above findings CTA HEAD FINDINGS Anterior circulation: No significant stenosis, proximal occlusion, aneurysm, or vascular malformation. Mild calcific atherosclerosis of carotid siphons with minimal less than 30% stenosis. Posterior circulation: No significant stenosis, proximal occlusion, aneurysm, or vascular malformation. Venous sinuses: As permitted by contrast timing, patent. Anatomic variants: Patent bilateral posterior communicating arteries and anterior communicating artery. Delayed phase: No abnormal intracranial enhancement. Review of the MIP images confirms the above findings IMPRESSION: 1. 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 Probable sequelae of radiation vasculopathy if lower neck was included in the radiation port. 2. Otherwise patent carotid and vertebral arteries of the neck. No dissection,  aneurysm, or additional segment of high-grade stenosis by NASCET criteria. 3. Patent anterior and posterior intracranial circulation. No large vessel occlusion, aneurysm, or significant stenosis. 4. No acute intracranial process or abnormal enhancement of the brain. 5. Paramediastinal radiation fibrosis of upper lungs. Soft tissue in left upper lobe corresponding to region of prior tumor is stable from prior chest CT. Electronically Signed   By: Kristine Garbe M.D.   On: 02/17/2017 04:02   Ct Angio Neck W Or Wo Contrast  Result Date: 02/17/2017 CLINICAL DATA:  67 y/o M; carotid stenosis. History of lung cancer post chemo radiation. EXAM: CT ANGIOGRAPHY HEAD AND NECK TECHNIQUE: Multidetector CT imaging of the head and neck was performed using the standard protocol during bolus administration of intravenous contrast. Multiplanar CT image reconstructions and MIPs were obtained to evaluate the vascular anatomy. Carotid stenosis measurements (when applicable) are obtained utilizing NASCET criteria, using the distal internal carotid diameter as the denominator. CONTRAST:  181mL ISOVUE-370 IOPAMIDOL (ISOVUE-370) INJECTION 76% COMPARISON:  07/06/2015 CT of the head. 02/07/2017 CT chest. 08/16/2014 PET-CT. FINDINGS: CT HEAD FINDINGS Brain: No evidence of acute infarction, hemorrhage, hydrocephalus, extra-axial collection or mass lesion/mass effect. Vascular: As below. Skull: Normal. Negative for fracture or focal lesion. Sinuses: Imaged portions are clear. Orbits: No acute finding. Review of the MIP  images confirms the above findings CTA NECK FINDINGS Aortic arch: Bovine variant branching. Imaged portion shows no evidence of aneurysm or dissection. No significant stenosis of the major arch vessel origins. Mild calcific atherosclerosis. Right carotid system: Long segment of severe 80% stenosis of right proximal common carotid artery. Right carotid system is otherwise widely patent without dissection, significant  stenosis, or aneurysm. Left carotid system: Long segment of severe 90% stenosis of proximal left common carotid artery. Left carotid system is otherwise widely patent without dissection, significant stenosis, or aneurysm. Vertebral arteries: Left dominant. No evidence of dissection, stenosis (50% or greater) or occlusion. Right V1 and proximal V2 segments are obscured by streak artifact from contrast bolus. Skeleton: C6-7 minimal retrolisthesis. Mild cervical spondylosis with loss of disc space height greatest at the C3-4 level. Other neck: Negative. Upper chest: Paramediastinal lung collapse likely representing radiation port fibrosis. Soft tissue and left upper lobe region of prior tumor is decreased in size from PET-CT stable from prior CT chest. Review of the MIP images confirms the above findings CTA HEAD FINDINGS Anterior circulation: No significant stenosis, proximal occlusion, aneurysm, or vascular malformation. Mild calcific atherosclerosis of carotid siphons with minimal less than 30% stenosis. Posterior circulation: No significant stenosis, proximal occlusion, aneurysm, or vascular malformation. Venous sinuses: As permitted by contrast timing, patent. Anatomic variants: Patent bilateral posterior communicating arteries and anterior communicating artery. Delayed phase: No abnormal intracranial enhancement. Review of the MIP images confirms the above findings IMPRESSION: 1. 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 Probable sequelae of radiation vasculopathy if lower neck was included in the radiation port. 2. Otherwise patent carotid and vertebral arteries of the neck. No dissection, aneurysm, or additional segment of high-grade stenosis by NASCET criteria. 3. Patent anterior and posterior intracranial circulation. No large vessel occlusion, aneurysm, or significant stenosis. 4. No acute intracranial process or abnormal enhancement of the brain. 5.  Paramediastinal radiation fibrosis of upper lungs. Soft tissue in left upper lobe corresponding to region of prior tumor is stable from prior chest CT. Electronically Signed   By: Kristine Garbe M.D.   On: 02/17/2017 04:02   Ct Chest W Contrast  Result Date: 02/07/2017 CLINICAL DATA:  Right lung adenocarcinoma 2010, left squamous cell lung cancer 2016, prior chemotherapy and radiation therapy. Cough, shortness of breath, and chest pain for 1 month. EXAM: CT CHEST WITH CONTRAST TECHNIQUE: Multidetector CT imaging of the chest was performed during intravenous contrast administration. CONTRAST:  3mL ISOVUE-300 IOPAMIDOL (ISOVUE-300) INJECTION 61% COMPARISON:  07/16/2016 FINDINGS: Cardiovascular: Severe luminal narrowing of both proximal common carotid arteries, significantly worsened on the left compared to the prior exam narrow left vertebral artery and questionably occluded right vertebral artery Atherosclerotic calcification of the aortic arch. Mediastinum/Nodes: Stable soft tissue density in the AP window adjacent to a region of treated tumor in the left upper lobe. No significant progression in this disease to suggest active process. Lungs/Pleura: Stable right paramediastinal and perihilar densities favoring radiation pneumonitis and chronic atelectasis. This is primarily in the right upper lobe although with components in the medial portion of the right lower lobe. Mildly elevated left hemidiaphragm with adjacent atelectasis. Therapy related findings along the left paramediastinal border with stable residual soft tissue density in this region measuring 1.3 cm in thickness. Centrilobular emphysema. Upper Abdomen: Mild prominence of stool in the visualized part of the colon. Stable appearance of scarring in the left mid kidney. Musculoskeletal: Unremarkable IMPRESSION: 1. 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 The patient is at high risk for brain hypoperfusion or stroke. Neurology consultation recommended, also considered CT angiography of the neck for definitive characterization. The carotid stenoses are more cephalad than I would expect for a radiation port unless the patient previously received radiation to the lower neck. 2. Stable post therapy related findings in the right paramediastinal and perihilar regions and in the left paramediastinal region. No growth to suggest active tumor at this time. 3. Other imaging findings of potential clinical significance: Aortic Atherosclerosis (ICD10-I70.0) and Emphysema (ICD10-J43.9). Stable scarring in the left mid kidney. These results will be called to the ordering clinician or representative by the Radiologist Assistant, and communication documented in the PACS or zVision Dashboard. Electronically Signed   By: Van Clines M.D.   On: 02/07/2017 15:21     ASSESSMENT/PLAN:  This is a very pleasant 67 years old African-American male with history of 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 The patient has disease recurrence on the left side presented as a stage IIIa non-small cell lung cancer, squamous cell carcinoma status post concurrent chemoradiation followed by consolidation chemotherapy. Completed in March 2017. He is currently on observation and feeling fine.  He had a recent restaging CT scan of the chest.  The patient was seen with Dr. Julien Nordmann.  Restaging CT scan shows no evidence of disease recurrence.  CT of the chest does show severe stenoses of the bilateral common carotid arteries.  He has already been evaluated by neuro-oncology and has been started on aspirin 325 mg with Lipitor 40 mg daily.  He has been taking these  medications without any difficulty and no missed doses.  He has a referral pending with neurology at [facility].  For a lung cancer standpoint, recommend that he continue observation and he will have a follow-up in 6 months with a restaging CT scan of the chest prior to the visit.  He was advised to call immediately if he has any concerning symptoms in the interval. All questions were answered. The patient knows to call the clinic with any problems, questions or concerns. We can certainly see the patient much sooner if necessary.  Orders Placed This Encounter  Procedures  . CT CHEST W CONTRAST    Standing Status:   Future    Standing Expiration Date:   02/23/2018    Order Specific Question:   If indicated for the ordered procedure, I authorize the administration of contrast media per Radiology protocol    Answer:   Yes    Order Specific Question:   Preferred imaging location?    Answer:   [facility]    Order Specific Question:   Radiology Contrast Protocol - do NOT remove file path    Answer:   [email or web address]    Order Specific Question:   Reason for Exam additional comments    Answer:   lung cancer. restaging.  [name] CBC with Differential (Cancer Center Only)    Standing Status:   Future    Standing Expiration Date:   02/23/2018  . CMP ([place] only)    Standing Status:   Future    Standing Expiration Date:   02/23/2018    Mikey Bussing, DNP, AGPCNP-BC, AOCNP 02/23/17  ADDENDUM: Hematology/Oncology  Attending: I had a face-to-face encounter with the patient today.  I recommended his care plan.  This is a very pleasant 67 years old African-American male with bilateral stage IIIa non-small cell lung cancer status post a course of concurrent chemoradiation twice. The patient is currently on observation and he has no significant complaints.  He had repeat CT scan of the chest performed recently that showed no evidence for disease progression but there  was concerning severe stenosis of the bilateral common carotid arteries.  He was seen by Dr. Mickeal Skinner from neurology for evaluation of his condition. I discussed the scan results with the patient and recommended for him to continue in observation with repeat CT scan of the chest in 6 months. He was advised to call immediately if he has any concerning symptoms in the interval. Disclaimer: This note was dictated with voice recognition software. Similar sounding words can inadvertently be transcribed and may be missed upon review. Eilleen Kempf, MD 02/23/17

## 2017-06-10 ENCOUNTER — Telehealth: Payer: Self-pay | Admitting: *Deleted

## 2017-06-10 NOTE — Telephone Encounter (Signed)
Call from pt reporting Dr. Mickeal Skinner has him on aspirin daily and Lipitor 40mg . He reports he has noticed L hip pain that extends down his leg for past 1 month. Throbbing pain wakes him up at night. Pt takes Tylenol 1,000 mg for this pain. Pt wonders if this pain is related to the lipitor and aspirin.  Informed him I will send message to MD for review.

## 2017-06-16 ENCOUNTER — Telehealth: Payer: Self-pay | Admitting: *Deleted

## 2017-06-16 NOTE — Telephone Encounter (Signed)
Patient called concerned about some side effects that seem to be getting worse since starting Lipitor.  He states he is having a lot of muscle hip pain that sometimes gets severe enough that he needs to take some OTC pain reliever.  He wasn't sure if it was because he had lost some weight and was having some issues because he had gotten smaller.    Reviewed with Dr Mickeal Skinner, advised patient to stop taking medication for next couple of weeks and see if the myalgia resolves and if it does then we can assume it was a side effect of the Lipitor.  Advised apteitn to notify us either way so that we could make further recommendations.

## 2017-08-23 ENCOUNTER — Inpatient Hospital Stay: Payer: Medicare PPO | Attending: Internal Medicine

## 2017-08-23 ENCOUNTER — Ambulatory Visit (HOSPITAL_COMMUNITY)
Admission: RE | Admit: 2017-08-23 | Discharge: 2017-08-23 | Disposition: A | Discharge status: Home / Self Care | Payer: Medicare PPO | Source: Ambulatory Visit | Attending: Oncology | Admitting: Oncology

## 2017-08-23 DIAGNOSIS — J439 Emphysema, unspecified: Secondary | ICD-10-CM | POA: Insufficient documentation

## 2017-08-23 DIAGNOSIS — Z85118 Personal history of other malignant neoplasm of bronchus and lung: Secondary | ICD-10-CM | POA: Insufficient documentation

## 2017-08-23 DIAGNOSIS — Z923 Personal history of irradiation: Secondary | ICD-10-CM | POA: Diagnosis not present

## 2017-08-23 DIAGNOSIS — I6523 Occlusion and stenosis of bilateral carotid arteries: Secondary | ICD-10-CM | POA: Diagnosis not present

## 2017-08-23 DIAGNOSIS — R5383 Other fatigue: Secondary | ICD-10-CM | POA: Insufficient documentation

## 2017-08-23 DIAGNOSIS — C3412 Malignant neoplasm of upper lobe, left bronchus or lung: Secondary | ICD-10-CM | POA: Insufficient documentation

## 2017-08-23 DIAGNOSIS — I700 Atherosclerosis of aorta: Secondary | ICD-10-CM | POA: Diagnosis not present

## 2017-08-23 LAB — CMP (CANCER CENTER ONLY)
ALK PHOS: 88 U/L (ref 38–126)
ALT: 16 U/L (ref 0–44)
AST: 17 U/L (ref 15–41)
Albumin: 3.8 g/dL (ref 3.5–5.0)
Anion gap: 6 (ref 5–15)
BUN: 15 mg/dL (ref 8–23)
CALCIUM: 9.4 mg/dL (ref 8.9–10.3)
CO2: 25 mmol/L (ref 22–32)
CREATININE: 0.96 mg/dL (ref 0.61–1.24)
Chloride: 109 mmol/L (ref 98–111)
Glucose, Bld: 100 mg/dL — ABNORMAL HIGH (ref 70–99)
Potassium: 4.1 mmol/L (ref 3.5–5.1)
Sodium: 140 mmol/L (ref 135–145)
Total Bilirubin: 0.3 mg/dL (ref 0.3–1.2)
Total Protein: 7.2 g/dL (ref 6.5–8.1)

## 2017-08-23 LAB — CBC WITH DIFFERENTIAL (CANCER CENTER ONLY)
Basophils Absolute: 0.1 10*3/uL (ref 0.0–0.1)
Basophils Relative: 2 %
Eosinophils Absolute: 0.9 10*3/uL — ABNORMAL HIGH (ref 0.0–0.5)
Eosinophils Relative: 19 %
HEMATOCRIT: 45.7 % (ref 38.4–49.9)
HEMOGLOBIN: 14.7 g/dL (ref 13.0–17.1)
LYMPHS ABS: 1.2 10*3/uL (ref 0.9–3.3)
LYMPHS PCT: 25 %
MCH: 29 pg (ref 27.2–33.4)
MCHC: 32.2 g/dL (ref 32.0–36.0)
MCV: 90.2 fL (ref 79.3–98.0)
Monocytes Absolute: 0.3 10*3/uL (ref 0.1–0.9)
Monocytes Relative: 6 %
NEUTROS ABS: 2.3 10*3/uL (ref 1.5–6.5)
Neutrophils Relative %: 48 %
Platelet Count: 331 10*3/uL (ref 140–400)
RBC: 5.07 MIL/uL (ref 4.20–5.82)
RDW: 13.9 % (ref 11.0–14.6)
WBC Count: 4.9 10*3/uL (ref 4.0–10.3)

## 2017-08-23 MED ORDER — IOHEXOL 300 MG/ML  SOLN
75.0000 mL | Freq: Once | INTRAMUSCULAR | Status: AC | PRN
  Administered 2017-08-23: 75 mL via INTRAVENOUS

## 2017-08-25 ENCOUNTER — Inpatient Hospital Stay (HOSPITAL_BASED_OUTPATIENT_CLINIC_OR_DEPARTMENT_OTHER): Payer: Medicare PPO | Admitting: Internal Medicine

## 2017-08-25 VITALS — BP 121/79 | HR 89 | Temp 97.9°F | Resp 18 | Ht 65.0 in | Wt 128.1 lb

## 2017-08-25 DIAGNOSIS — C3411 Malignant neoplasm of upper lobe, right bronchus or lung: Secondary | ICD-10-CM

## 2017-08-25 DIAGNOSIS — I10 Essential (primary) hypertension: Secondary | ICD-10-CM

## 2017-08-25 DIAGNOSIS — C3412 Malignant neoplasm of upper lobe, left bronchus or lung: Secondary | ICD-10-CM | POA: Diagnosis not present

## 2017-08-25 DIAGNOSIS — Z85118 Personal history of other malignant neoplasm of bronchus and lung: Secondary | ICD-10-CM

## 2017-08-25 DIAGNOSIS — R5383 Other fatigue: Secondary | ICD-10-CM

## 2017-08-25 DIAGNOSIS — C3490 Malignant neoplasm of unspecified part of unspecified bronchus or lung: Secondary | ICD-10-CM

## 2017-08-25 NOTE — Progress Notes (Signed)
[place] Telephone:(336) [phone]   Fax:(336) [phone]  OFFICE PROGRESS NOTE  Patient, No Pcp Per No address on file  PRINCIPAL DIAGNOSIS: 1) stage IIIA (T2a, N2, M0) non-small cell lung cancer, squamous cell carcinoma of the left upper lobe diagnosed in August 2016 2)  Stage IIIA (T2 N2 Mx) non-small cell lung cancer, adenocarcinoma, involving the right upper lobe and mediastinal lymphadenopathy diagnosed in June 2010.   PRIOR THERAPY:  1. Status post concurrent chemoradiation with weekly carboplatin and paclitaxel; last dose was given April 08, 2008. 2. Status post 3 cycles of consolidation chemotherapy with docetaxel; last dose was given on Jun 26, 2008. 3. Concurrent chemoradiation with weekly carboplatin for AUC of 2 and paclitaxel 45 MG/M2. First dose expected on 09/23/2014 status post 7 cycles. Last dose was given 12/01/2014 with partial response. 4. Consolidation chemotherapy with carboplatin for AUC of 5 and paclitaxel 175 MG/M2 every 3 weeks with Neulasta support. First dose 01/02/2015. Status post 3 cycles.  CURRENT THERAPY: Observation.  INTERVAL HISTORY: Evan [name] 67 y.o. male returns to the clinic today for six-month follow-up visit.  The patient has no complaints today except for mild fatigue.  He denied having any chest pain, shortness of breath, cough or hemoptysis.  He denied having any fever or chills.  He has no nausea, vomiting, diarrhea or constipation.  He denied having any recent weight loss or night sweats.  He had repeat CT scan of the chest performed recently and is here for evaluation and discussion of his discuss results.  MEDICAL HISTORY: Past Medical History:  Diagnosis Date  . Anxiety   . Complication of anesthesia    " sometimes I wake up during surgery "  . Depression   . Left renal mass 07/19/2016  . Lung cancer ([place])    non small cell lung cancer    ALLERGIES:  has No Known Allergies.  MEDICATIONS:  Current  Outpatient Medications  Medication Sig Dispense Refill  . albuterol (PROVENTIL HFA;VENTOLIN HFA) 108 (90 BASE) MCG/ACT inhaler Inhale 2 puffs into the lungs every 6 (six) hours as needed for wheezing or shortness of breath. 1 Inhaler 2  . aspirin EC 325 MG tablet Take 1 tablet (325 mg total) by mouth daily. 30 tablet 5  . atorvastatin (LIPITOR) 40 MG tablet Take 1 tablet (40 mg total) by mouth daily. 30 tablet 5  . ibuprofen (ADVIL,MOTRIN) 600 MG tablet Take 600 mg by mouth every 6 (six) hours as needed.    . Multiple Vitamin (MULTIVITAMIN WITH MINERALS) TABS tablet Take 1 tablet by mouth daily.    . Nutritional Supplements (CARNATION BREAKFAST ESSENTIALS PO) Take 1 packet by mouth daily.     . Tiotropium Bromide-Olodaterol (STIOLTO RESPIMAT) 2.5-2.5 MCG/ACT AERS Inhale 2 puffs into the lungs daily. 1 Inhaler 5   No current facility-administered medications for this visit.     SURGICAL HISTORY:  Past Surgical History:  Procedure Laterality Date  . AMPUTATION Right 05/21/2012   Procedure: AMPUTATION BELOW KNEE;  Surgeon: Newt Minion, MD;  Location: [facility];  Service: Orthopedics;  Laterality: Right;  . AMPUTATION Right 06/21/2012   Procedure: Revision AMPUTATION BELOW KNEE Right;  Surgeon: Newt Minion, MD;  Location: [facility];  Service: Orthopedics;  Laterality: Right;  Revision right Below Knee Amputation  . COLONOSCOPY W/ BIOPSIES AND POLYPECTOMY    . FOOT AMPUTATION Right    traumatic right lower extremity  . UPPER JAW     SURGERY  FOR INFECTION    REVIEW OF SYSTEMS:  A comprehensive review of systems was negative.   PHYSICAL EXAMINATION: General appearance: alert, cooperative and no distress Head: Normocephalic, without obvious abnormality, atraumatic Neck: no adenopathy Lymph nodes: Cervical, supraclavicular, and axillary nodes normal. Resp: clear to auscultation bilaterally Back: symmetric, no curvature. ROM normal. No CVA tenderness. Cardio: regular rate and rhythm, S1, S2  normal, no murmur, click, rub or gallop GI: soft, non-tender; bowel sounds normal; no masses,  no organomegaly Extremities: extremities normal, atraumatic, no cyanosis or edema  ECOG PERFORMANCE STATUS: 0 - Asymptomatic  Blood pressure 121/79, pulse 89, temperature 97.9 F (36.6 C), temperature source Oral, resp. rate 18, height 5\' 5"  (1.651 m), weight 128 lb 1.6 oz (58.1 kg), SpO2 97 %.  LABORATORY DATA: Lab Results  Component Value Date   WBC 4.9 08/23/2017   HGB 14.7 08/23/2017   HCT 45.7 08/23/2017   MCV 90.2 08/23/2017   PLT 331 08/23/2017      Chemistry      Component Value Date/Time   NA 140 08/23/2017 1113   NA 141 07/16/2016 1220   K 4.1 08/23/2017 1113   K 4.0 07/16/2016 1220   CL 109 08/23/2017 1113   CL 104 07/19/2012 1432   CO2 25 08/23/2017 1113   CO2 24 07/16/2016 1220   BUN 15 08/23/2017 1113   BUN 8.8 07/16/2016 1220   CREATININE 0.96 08/23/2017 1113   CREATININE 1.0 07/16/2016 1220      Component Value Date/Time   CALCIUM 9.4 08/23/2017 1113   CALCIUM 9.3 07/16/2016 1220   ALKPHOS 88 08/23/2017 1113   ALKPHOS 92 07/16/2016 1220   AST 17 08/23/2017 1113   AST 18 07/16/2016 1220   ALT 16 08/23/2017 1113   ALT 14 07/16/2016 1220   BILITOT 0.3 08/23/2017 1113   BILITOT 0.39 07/16/2016 1220       RADIOGRAPHIC STUDIES: Ct Chest W Contrast  Result Date: 08/23/2017 CLINICAL DATA:  Followup bilateral lung cancer. EXAM: CT CHEST WITH CONTRAST TECHNIQUE: Multidetector CT imaging of the chest was performed during intravenous contrast administration. CONTRAST:  48mL OMNIPAQUE IOHEXOL 300 MG/ML  SOLN COMPARISON:  02/07/2017 FINDINGS: Cardiovascular: The heart is normal in size and stable. No pericardial effusion. Stable tortuosity, ectasia and calcification of the thoracic aorta. No dissection. Again noted is significant stenosis of both carotid arteries. Significant chest wall collaterals likely due to pinching of these left subclavian vein with the patient's  arm up. The left brachiocephalic vein and SVC are patent. Mediastinum/Nodes: No mediastinal or hilar mass or lymphadenopathy. Small scattered lymph nodes are stable. Stable bilateral paramediastinal soft tissue density which is likely treated tumor and radiation fibrosis. I do not see any areas of in large mint to suggest recurrent tumor. Lungs/Pleura: Stable severe emphysematous changes and pulmonary scarring. Stable appearing radiation changes involving both lungs medially. I do not see any findings suspicious for recurrent tumor. 8 mm curvilinear density in the right upper lobe on image number 55 is somewhat tenting the major fissure and is probably a small focus of scarring change. It was fairly similar on the prior study. 4.5 mm subpleural nodular density in the right lower lobe on image number 95. On the prior study this appeared to be 2 small adjacent nodules. Recommend attention on follow-up exams. Stable eventration of both hemidiaphragms, left greater than right. Upper Abdomen: No significant upper abdominal findings. No hepatic or adrenal gland metastasis are identified. Musculoskeletal: No chest wall mass, supraclavicular or axillary adenopathy.  Stable lytic process involving the right aspect of the manubrium with a remote pathologic fracture. This could be due to treated metastatic disease or radiation. I do not see any other definite bone lesions. IMPRESSION: 1. Stable paramediastinal soft tissue density bilaterally consistent with treated tumor/radiation fibrosis. No findings suspicious for recurrent tumor. 2. Small lesions in the right lung appear relatively stable but recommend attention on follow-up scans. 3. Stable emphysematous changes and pulmonary scarring. 4. No enlarged mediastinal or hilar lymph nodes. 5. Stable lytic process involving the right aspect of the manubrium with a chronic pathologic fracture. Since I do not see any other bone lesions I suspect this is due to radiation. 6. Stable  critical bilateral carotid artery stenosis. Aortic Atherosclerosis (ICD10-I70.0) and Emphysema (ICD10-J43.9). Electronically Signed   By: Marijo Sanes M.D.   On: 08/23/2017 16:55    ASSESSMENT AND PLAN:  This is a very pleasant 67 years old African-American male with history of 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 The patient has disease recurrence on the left side presented as a stage IIIa non-small cell lung cancer, squamous cell carcinoma status post concurrent chemoradiation followed by consolidation chemotherapy. Completed in March 2017. The patient continues on observation and he is feeling fine. Repeat CT scan of the chest showed no concerning findings for disease progression. I discussed the scan results with the patient and recommended for him to continue on observation with repeat CT scan of the chest in 6 months. He was advised to call immediately if he has any concerning symptoms in the interval. All questions were answered. The patient knows to call the clinic with any problems, questions or concerns. We can certainly see the patient much sooner if necessary. I spent 10 minutes counseling the patient face to face. The total time spent in the appointment was 15 minutes.  Disclaimer: This note was dictated with voice recognition software. Similar sounding words can inadvertently be transcribed and may not be corrected upon review.

## 2017-08-26 ENCOUNTER — Encounter: Payer: Self-pay | Admitting: Internal Medicine

## 2017-08-26 ENCOUNTER — Telehealth: Payer: Self-pay | Admitting: Internal Medicine

## 2017-08-26 NOTE — Telephone Encounter (Signed)
Left vm for pt re appts that were added. Sending confirmation letter in the mail.

## 2017-09-13 ENCOUNTER — Other Ambulatory Visit: Payer: Self-pay | Admitting: *Deleted

## 2017-09-13 MED ORDER — ASPIRIN EC 325 MG PO TBEC: 325.0000 mg | DELAYED_RELEASE_TABLET | Freq: Every day | ORAL | 5 refills | Status: DC

## 2018-02-22 ENCOUNTER — Other Ambulatory Visit: Payer: Self-pay | Admitting: Internal Medicine

## 2018-02-24 ENCOUNTER — Inpatient Hospital Stay: Payer: Medicare PPO | Attending: Internal Medicine

## 2018-02-24 ENCOUNTER — Ambulatory Visit (HOSPITAL_COMMUNITY)
Admission: RE | Admit: 2018-02-24 | Discharge: 2018-02-24 | Disposition: A | Discharge status: Home / Self Care | Payer: Medicare PPO | Source: Ambulatory Visit | Attending: Internal Medicine | Admitting: Internal Medicine

## 2018-02-24 ENCOUNTER — Encounter (HOSPITAL_COMMUNITY): Payer: Self-pay

## 2018-02-24 DIAGNOSIS — Z85118 Personal history of other malignant neoplasm of bronchus and lung: Secondary | ICD-10-CM | POA: Diagnosis not present

## 2018-02-24 DIAGNOSIS — C3490 Malignant neoplasm of unspecified part of unspecified bronchus or lung: Secondary | ICD-10-CM

## 2018-02-24 DIAGNOSIS — R0989 Other specified symptoms and signs involving the circulatory and respiratory systems: Secondary | ICD-10-CM | POA: Insufficient documentation

## 2018-02-24 DIAGNOSIS — R05 Cough: Secondary | ICD-10-CM | POA: Insufficient documentation

## 2018-02-24 DIAGNOSIS — R911 Solitary pulmonary nodule: Secondary | ICD-10-CM | POA: Diagnosis not present

## 2018-02-24 DIAGNOSIS — C3412 Malignant neoplasm of upper lobe, left bronchus or lung: Secondary | ICD-10-CM | POA: Insufficient documentation

## 2018-02-24 LAB — CMP (CANCER CENTER ONLY)
ALT: 20 U/L (ref 0–44)
AST: 17 U/L (ref 15–41)
Albumin: 3.9 g/dL (ref 3.5–5.0)
Alkaline Phosphatase: 103 U/L (ref 38–126)
Anion gap: 8 (ref 5–15)
BUN: 14 mg/dL (ref 8–23)
CALCIUM: 9.6 mg/dL (ref 8.9–10.3)
CO2: 27 mmol/L (ref 22–32)
CREATININE: 1.04 mg/dL (ref 0.61–1.24)
Chloride: 104 mmol/L (ref 98–111)
GFR, Est AFR Am: >60 mL/min (ref >60)
Glucose, Bld: 89 mg/dL (ref 70–99)
Potassium: 4.5 mmol/L (ref 3.5–5.1)
Sodium: 139 mmol/L (ref 135–145)
Total Bilirubin: 0.3 mg/dL (ref 0.3–1.2)
Total Protein: 7.8 g/dL (ref 6.5–8.1)

## 2018-02-24 LAB — CBC WITH DIFFERENTIAL (CANCER CENTER ONLY)
Abs Immature Granulocytes: 0.01 10*3/uL (ref 0.00–0.07)
BASOS ABS: 0.1 10*3/uL (ref 0.0–0.1)
Basophils Relative: 1 %
EOS ABS: 0.9 10*3/uL — AB (ref 0.0–0.5)
EOS PCT: 14 %
HEMATOCRIT: 46.2 % (ref 39.0–52.0)
Hemoglobin: 15 g/dL (ref 13.0–17.0)
Immature Granulocytes: 0 %
LYMPHS ABS: 1.5 10*3/uL (ref 0.7–4.0)
LYMPHS PCT: 24 %
MCH: 29 pg (ref 26.0–34.0)
MCHC: 32.5 g/dL (ref 30.0–36.0)
MCV: 89.4 fL (ref 80.0–100.0)
MONO ABS: 0.5 10*3/uL (ref 0.1–1.0)
Monocytes Relative: 7 %
Neutro Abs: 3.4 10*3/uL (ref 1.7–7.7)
Neutrophils Relative %: 54 %
Platelet Count: 374 10*3/uL (ref 150–400)
RBC: 5.17 MIL/uL (ref 4.22–5.81)
RDW: 15 % (ref 11.5–15.5)
WBC Count: 6.3 10*3/uL (ref 4.0–10.5)
nRBC: 0 % (ref 0.0–0.2)

## 2018-02-24 MED ORDER — IOHEXOL 300 MG/ML  SOLN
75.0000 mL | Freq: Once | INTRAMUSCULAR | Status: AC | PRN
  Administered 2018-02-24: 75 mL via INTRAVENOUS

## 2018-02-24 MED ORDER — SODIUM CHLORIDE (PF) 0.9 % IJ SOLN
INTRAMUSCULAR | Status: AC
  Filled 2018-02-24: qty 50

## 2018-02-27 ENCOUNTER — Encounter: Payer: Self-pay | Admitting: Internal Medicine

## 2018-02-27 ENCOUNTER — Inpatient Hospital Stay (HOSPITAL_BASED_OUTPATIENT_CLINIC_OR_DEPARTMENT_OTHER): Payer: Medicare PPO | Admitting: Internal Medicine

## 2018-02-27 ENCOUNTER — Other Ambulatory Visit: Payer: Self-pay

## 2018-02-27 ENCOUNTER — Telehealth: Payer: Self-pay

## 2018-02-27 VITALS — BP 141/93 | HR 84 | Temp 98.1°F | Resp 20 | Wt 133.7 lb

## 2018-02-27 DIAGNOSIS — R911 Solitary pulmonary nodule: Secondary | ICD-10-CM

## 2018-02-27 DIAGNOSIS — R05 Cough: Secondary | ICD-10-CM

## 2018-02-27 DIAGNOSIS — Z5111 Encounter for antineoplastic chemotherapy: Secondary | ICD-10-CM

## 2018-02-27 DIAGNOSIS — Z85118 Personal history of other malignant neoplasm of bronchus and lung: Secondary | ICD-10-CM

## 2018-02-27 DIAGNOSIS — C3411 Malignant neoplasm of upper lobe, right bronchus or lung: Secondary | ICD-10-CM

## 2018-02-27 DIAGNOSIS — R0989 Other specified symptoms and signs involving the circulatory and respiratory systems: Secondary | ICD-10-CM | POA: Diagnosis not present

## 2018-02-27 DIAGNOSIS — C3412 Malignant neoplasm of upper lobe, left bronchus or lung: Secondary | ICD-10-CM

## 2018-02-27 DIAGNOSIS — C3490 Malignant neoplasm of unspecified part of unspecified bronchus or lung: Secondary | ICD-10-CM

## 2018-02-27 NOTE — Telephone Encounter (Signed)
Printed avs and calender of upcoming appointment. Per 1/27 los

## 2018-02-27 NOTE — Progress Notes (Signed)
[place] Telephone:(336) [phone]   Fax:(336) [phone]  OFFICE PROGRESS NOTE  Patient, No Pcp Per No address on file  PRINCIPAL DIAGNOSIS: 1) stage IIIA (T2a, N2, M0) non-small cell lung cancer, squamous cell carcinoma of the left upper lobe diagnosed in August 2016 2)  Stage IIIA (T2 N2 Mx) non-small cell lung cancer, adenocarcinoma, involving the right upper lobe and mediastinal lymphadenopathy diagnosed in June 2010.   PRIOR THERAPY:  1. Status post concurrent chemoradiation with weekly carboplatin and paclitaxel; last dose was given April 08, 2008. 2. Status post 3 cycles of consolidation chemotherapy with docetaxel; last dose was given on Jun 26, 2008. 3. Concurrent chemoradiation with weekly carboplatin for AUC of 2 and paclitaxel 45 MG/M2. First dose expected on 09/23/2014 status post 7 cycles. Last dose was given 12/01/2014 with partial response. 4. Consolidation chemotherapy with carboplatin for AUC of 5 and paclitaxel 175 MG/M2 every 3 weeks with Neulasta support. First dose 01/02/2015. Status post 3 cycles.  CURRENT THERAPY: Observation.  INTERVAL HISTORY: Evan [name] 68 y.o. male returns to the clinic today for 6 months follow-up visit.  The patient is feeling fine today with no concerning complaints except for chest congestion and mild cough.  He denied having any chest pain or hemoptysis.  He denied having any recent weight loss or night sweats.  He has no nausea, vomiting, diarrhea or constipation.  He denied having any headache or visual changes.  He is here today for evaluation with repeat CT scan of the chest for restaging of his disease.  MEDICAL HISTORY: Past Medical History:  Diagnosis Date  . Anxiety   . Complication of anesthesia    " sometimes I wake up during surgery "  . Depression   . Left renal mass 07/19/2016  . Lung cancer ([place]) dx'd 2010    NSCL CA right  . Lung cancer ([place]) dx'd 2017   left    ALLERGIES:  has No Known  Allergies.  MEDICATIONS:  Current Outpatient Medications  Medication Sig Dispense Refill  . acetaminophen (TYLENOL) 500 MG tablet Take by mouth.    [name] albuterol (PROVENTIL HFA;VENTOLIN HFA) 108 (90 BASE) MCG/ACT inhaler Inhale 2 puffs into the lungs every 6 (six) hours as needed for wheezing or shortness of breath. 1 Inhaler 2  . atorvastatin (LIPITOR) 40 MG tablet Take 1 tablet (40 mg total) by mouth daily. 30 tablet 5  . [employer] ASPIRIN EC 325 MG EC tablet TAKE 1 TABLET BY MOUTH EVERY DAY 90 tablet 1  . ibuprofen (ADVIL,MOTRIN) 600 MG tablet Take 600 mg by mouth every 6 (six) hours as needed.    . Multiple Vitamin (MULTIVITAMIN WITH MINERALS) TABS tablet Take 1 tablet by mouth daily.    . Nutritional Supplements (CARNATION BREAKFAST ESSENTIALS PO) Take 1 packet by mouth daily.     . Tiotropium Bromide-Olodaterol (STIOLTO RESPIMAT) 2.5-2.5 MCG/ACT AERS Inhale 2 puffs into the lungs daily. (Patient not taking: Reported on 02/27/2018) 1 Inhaler 5   No current facility-administered medications for this visit.     SURGICAL HISTORY:  Past Surgical History:  Procedure Laterality Date  . AMPUTATION Right 05/21/2012   Procedure: AMPUTATION BELOW KNEE;  Surgeon: Newt Minion, MD;  Location: [facility];  Service: Orthopedics;  Laterality: Right;  . AMPUTATION Right 06/21/2012   Procedure: Revision AMPUTATION BELOW KNEE Right;  Surgeon: Newt Minion, MD;  Location: [facility];  Service: Orthopedics;  Laterality: Right;  Revision right Below Knee Amputation  .  COLONOSCOPY W/ BIOPSIES AND POLYPECTOMY    . FOOT AMPUTATION Right    traumatic right lower extremity  . UPPER JAW     SURGERY FOR INFECTION    REVIEW OF SYSTEMS:  Constitutional: positive for fatigue Eyes: negative Ears, nose, mouth, throat, and face: negative Respiratory: positive for cough and dyspnea on exertion Cardiovascular: negative Gastrointestinal: negative Genitourinary:negative Integument/breast: negative Hematologic/lymphatic:  negative Musculoskeletal:negative Neurological: negative Behavioral/Psych: negative Endocrine: negative Allergic/Immunologic: negative   PHYSICAL EXAMINATION: General appearance: alert, cooperative, fatigued and no distress Head: Normocephalic, without obvious abnormality, atraumatic Neck: no adenopathy Lymph nodes: Cervical, supraclavicular, and axillary nodes normal. Resp: clear to auscultation bilaterally Back: symmetric, no curvature. ROM normal. No CVA tenderness. Cardio: regular rate and rhythm, S1, S2 normal, no murmur, click, rub or gallop GI: soft, non-tender; bowel sounds normal; no masses,  no organomegaly Extremities: extremities normal, atraumatic, no cyanosis or edema Neurologic: Alert and oriented X 3, normal strength and tone. Normal symmetric reflexes. Normal coordination and gait  ECOG PERFORMANCE STATUS: 0 - Asymptomatic  Blood pressure (!) 141/93, pulse 84, temperature 98.1 F (36.7 C), temperature source Oral, resp. rate 20, weight 133 lb 11.2 oz (60.6 kg), SpO2 98 %.  LABORATORY DATA: Lab Results  Component Value Date   WBC 6.3 02/24/2018   HGB 15.0 02/24/2018   HCT 46.2 02/24/2018   MCV 89.4 02/24/2018   PLT 374 02/24/2018      Chemistry      Component Value Date/Time   NA 139 02/24/2018 1121   NA 141 07/16/2016 1220   K 4.5 02/24/2018 1121   K 4.0 07/16/2016 1220   CL 104 02/24/2018 1121   CL 104 07/19/2012 1432   CO2 27 02/24/2018 1121   CO2 24 07/16/2016 1220   BUN 14 02/24/2018 1121   BUN 8.8 07/16/2016 1220   CREATININE 1.04 02/24/2018 1121   CREATININE 1.0 07/16/2016 1220      Component Value Date/Time   CALCIUM 9.6 02/24/2018 1121   CALCIUM 9.3 07/16/2016 1220   ALKPHOS 103 02/24/2018 1121   ALKPHOS 92 07/16/2016 1220   AST 17 02/24/2018 1121   AST 18 07/16/2016 1220   ALT 20 02/24/2018 1121   ALT 14 07/16/2016 1220   BILITOT 0.3 02/24/2018 1121   BILITOT 0.39 07/16/2016 1220       RADIOGRAPHIC STUDIES: Ct Chest W  Contrast  Result Date: 02/24/2018 CLINICAL DATA:  History of bilateral lung cancer. EXAM: CT CHEST WITH CONTRAST TECHNIQUE: Multidetector CT imaging of the chest was performed during intravenous contrast administration. CONTRAST:  74mL OMNIPAQUE IOHEXOL 300 MG/ML  SOLN COMPARISON:  08/03/2017 FINDINGS: Cardiovascular: The heart is normal in size. No pericardial effusion. Stable mild tortuosity and calcification of the thoracic aorta but no dissection. The branch vessels are patent. Again noted is critical bilateral carotid artery stenosis. Mediastinum/Nodes: No mediastinal lymphadenopathy. There are stable radiation changes involving the upper anterior left mediastinum and adjacent paramediastinal lung and also the right suprahilar and suprahilar regions and adjacent paramediastinal lung. I do not see any findings suspicious for recurrent tumor Lungs/Pleura: Stable emphysematous changes and pulmonary scarring. New appearing 6.5 mm nodule in the left lung apex on image number 29. There may have been some subtle nodularity in this area in the prior study. 8.5 mm nodular lesion in the left lung apex anterior to the aortic arch on image number 36. This appears progressive since the prior study where it measured a maximum of 4.5 mm. These findings are somewhat suspicious for recurrent  tumor. In the right upper lobe there is a nodular density somewhat tenting the major fissure on image number 68. This has increased slightly in size measuring a maximum levin mm and previously measuring 8 mm. It also has a thickened appearing posterior border measuring 4.5 mm. This previously measured 2.5 mm. Stable 4.5 mm subpleural nodule in the right lower lobe on image number 109. Upper Abdomen: No significant upper abdominal findings. Musculoskeletal: No chest wall mass, supraclavicular or axillary adenopathy. No significant bony findings. IMPRESSION: 1. Interval enlargement of 2 left apical lung nodules and enlargement of a right  upper lobe nodule. Findings are somewhat worrisome and I would recommend a PET-CT for further evaluation. 2. No enlarged mediastinal or hilar lymph nodes. 3. Stable critical bilateral carotid artery stenosis. Aortic Atherosclerosis (ICD10-I70.0) and Emphysema (ICD10-J43.9). Electronically Signed   By: Marijo Sanes M.D.   On: 02/24/2018 14:31    ASSESSMENT AND PLAN:  This is a very pleasant 68 years old African-American male with history of 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 The patient has disease recurrence on the left side presented as a stage IIIa non-small cell lung cancer, squamous cell carcinoma status post concurrent chemoradiation followed by consolidation chemotherapy. Completed in March 2017. The patient is currently on observation and he is feeling fine. He had repeat CT scan of the chest performed recently.  I personally and independently reviewed the scan images and discussed the results with the patient and showed him the images today. His scan showed no concerning findings for disease progression except for enlargement of 2 left apical pulmonary nodules that could be concerning for disease progression. I recommended for the patient to have repeat CT scan of the chest in 3 months for further evaluation of these nodules. He was advised to call immediately if he has any concerning symptoms in the interval. For hypertension, the patient will continue his current blood pressure medication. All questions were answered. The patient knows to call the clinic with any problems, questions or concerns. We can certainly see the patient much sooner if necessary.   Disclaimer: This note was dictated with voice recognition software. Similar sounding words can inadvertently be transcribed and may not be corrected upon review.

## 2018-02-27 NOTE — Telephone Encounter (Signed)
Error opening  

## 2018-05-16 ENCOUNTER — Telehealth: Payer: Self-pay | Admitting: Internal Medicine

## 2018-05-16 NOTE — Telephone Encounter (Signed)
Tried to call, no voicemail setup

## 2018-05-26 ENCOUNTER — Inpatient Hospital Stay: Payer: Medicare PPO | Attending: Internal Medicine

## 2018-05-26 ENCOUNTER — Ambulatory Visit (HOSPITAL_COMMUNITY)
Admission: RE | Admit: 2018-05-26 | Discharge: 2018-05-26 | Disposition: A | Discharge status: Home / Self Care | Payer: Medicare PPO | Source: Ambulatory Visit | Attending: Internal Medicine | Admitting: Internal Medicine

## 2018-05-26 ENCOUNTER — Ambulatory Visit (HOSPITAL_COMMUNITY): Admission: RE | Admit: 2018-05-26 | Payer: Medicare PPO | Source: Ambulatory Visit

## 2018-05-26 ENCOUNTER — Other Ambulatory Visit: Payer: Self-pay

## 2018-05-26 DIAGNOSIS — C3411 Malignant neoplasm of upper lobe, right bronchus or lung: Secondary | ICD-10-CM | POA: Diagnosis present

## 2018-05-26 DIAGNOSIS — C3490 Malignant neoplasm of unspecified part of unspecified bronchus or lung: Secondary | ICD-10-CM | POA: Insufficient documentation

## 2018-05-26 DIAGNOSIS — R0609 Other forms of dyspnea: Secondary | ICD-10-CM | POA: Insufficient documentation

## 2018-05-26 DIAGNOSIS — I10 Essential (primary) hypertension: Secondary | ICD-10-CM | POA: Insufficient documentation

## 2018-05-26 DIAGNOSIS — R5383 Other fatigue: Secondary | ICD-10-CM | POA: Diagnosis not present

## 2018-05-26 LAB — CBC WITH DIFFERENTIAL (CANCER CENTER ONLY)
Abs Immature Granulocytes: 0.01 10*3/uL (ref 0.00–0.07)
Basophils Absolute: 0.1 10*3/uL (ref 0.0–0.1)
Basophils Relative: 1 %
Eosinophils Absolute: 0.9 10*3/uL — ABNORMAL HIGH (ref 0.0–0.5)
Eosinophils Relative: 16 %
HCT: 43.7 % (ref 39.0–52.0)
Hemoglobin: 14 g/dL (ref 13.0–17.0)
Immature Granulocytes: 0 %
Lymphocytes Relative: 26 %
Lymphs Abs: 1.5 10*3/uL (ref 0.7–4.0)
MCH: 28.4 pg (ref 26.0–34.0)
MCHC: 32 g/dL (ref 30.0–36.0)
MCV: 88.6 fL (ref 80.0–100.0)
Monocytes Absolute: 0.6 10*3/uL (ref 0.1–1.0)
Monocytes Relative: 10 %
Neutro Abs: 2.6 10*3/uL (ref 1.7–7.7)
Neutrophils Relative %: 47 %
Platelet Count: 374 10*3/uL (ref 150–400)
RBC: 4.93 MIL/uL (ref 4.22–5.81)
RDW: 14.1 % (ref 11.5–15.5)
WBC Count: 5.6 10*3/uL (ref 4.0–10.5)
nRBC: 0 % (ref 0.0–0.2)

## 2018-05-26 LAB — CMP (CANCER CENTER ONLY)
ALT: 26 U/L (ref 0–44)
AST: 21 U/L (ref 15–41)
Albumin: 3.6 g/dL (ref 3.5–5.0)
Alkaline Phosphatase: 97 U/L (ref 38–126)
Anion gap: 9 (ref 5–15)
BUN: 12 mg/dL (ref 8–23)
CO2: 24 mmol/L (ref 22–32)
Calcium: 8 mg/dL — ABNORMAL LOW (ref 8.9–10.3)
Chloride: 104 mmol/L (ref 98–111)
Creatinine: 1.02 mg/dL (ref 0.61–1.24)
GFR, Est AFR Am: >60 mL/min (ref >60)
GFR, Estimated: >60 mL/min (ref >60)
Glucose, Bld: 87 mg/dL (ref 70–99)
Potassium: 4.1 mmol/L (ref 3.5–5.1)
Sodium: 137 mmol/L (ref 135–145)
Total Bilirubin: 0.3 mg/dL (ref 0.3–1.2)
Total Protein: 7.3 g/dL (ref 6.5–8.1)

## 2018-05-26 MED ORDER — SODIUM CHLORIDE (PF) 0.9 % IJ SOLN
INTRAMUSCULAR | Status: AC
  Filled 2018-05-26: qty 50

## 2018-05-26 MED ORDER — IOHEXOL 300 MG/ML  SOLN
75.0000 mL | Freq: Once | INTRAMUSCULAR | Status: AC | PRN
  Administered 2018-05-26: 13:00:00 75 mL via INTRAVENOUS

## 2018-05-29 ENCOUNTER — Encounter: Payer: Self-pay | Admitting: Internal Medicine

## 2018-05-29 ENCOUNTER — Inpatient Hospital Stay (HOSPITAL_BASED_OUTPATIENT_CLINIC_OR_DEPARTMENT_OTHER): Payer: Medicare PPO | Admitting: Internal Medicine

## 2018-05-29 ENCOUNTER — Other Ambulatory Visit: Payer: Self-pay

## 2018-05-29 VITALS — BP 135/95 | HR 97 | Temp 97.8°F | Resp 18 | Wt 136.0 lb

## 2018-05-29 DIAGNOSIS — C3412 Malignant neoplasm of upper lobe, left bronchus or lung: Secondary | ICD-10-CM

## 2018-05-29 DIAGNOSIS — R0609 Other forms of dyspnea: Secondary | ICD-10-CM | POA: Diagnosis not present

## 2018-05-29 DIAGNOSIS — C3411 Malignant neoplasm of upper lobe, right bronchus or lung: Secondary | ICD-10-CM

## 2018-05-29 DIAGNOSIS — C3490 Malignant neoplasm of unspecified part of unspecified bronchus or lung: Secondary | ICD-10-CM

## 2018-05-29 DIAGNOSIS — I10 Essential (primary) hypertension: Secondary | ICD-10-CM

## 2018-05-29 DIAGNOSIS — R911 Solitary pulmonary nodule: Secondary | ICD-10-CM

## 2018-05-29 DIAGNOSIS — R5383 Other fatigue: Secondary | ICD-10-CM

## 2018-05-29 NOTE — Progress Notes (Signed)
[place] [phone]   [phone]  OFFICE PROGRESS NOTE  Default, Provider, MD No address on file  PRINCIPAL DIAGNOSIS: 1) stage IIIA (T2a, N2, M0) non-small cell lung cancer, squamous cell carcinoma of the left upper lobe diagnosed in August 2016 2)  Stage IIIA (T2 N2 Mx) non-small cell lung cancer, adenocarcinoma, involving the right upper lobe and mediastinal lymphadenopathy diagnosed in June 2010.   PRIOR THERAPY:  1. Status post concurrent chemoradiation with weekly carboplatin and paclitaxel; last dose was given April 08, 2008. 2. Status post 3 cycles of consolidation chemotherapy with docetaxel; last dose was given on Jun 26, 2008. 3. Concurrent chemoradiation with weekly carboplatin for AUC of 2 and paclitaxel 45 MG/M2. First dose expected on 09/23/2014 status post 7 cycles. Last dose was given 12/01/2014 with partial response. 4. Consolidation chemotherapy with carboplatin for AUC of 5 and paclitaxel 175 MG/M2 every 3 weeks with Neulasta support. First dose 01/02/2015. Status post 3 cycles.  CURRENT THERAPY: Observation.  INTERVAL HISTORY: Evan [name] 68 y.o. male returns to the clinic today for follow-up visit.  The patient is feeling fine today with no concerning complaints except for shortness of breath with exertion and mild fatigue.  She denied having any current chest pain, cough or hemoptysis.  He denied having any recent weight loss or night sweats.  He has no nausea, vomiting, diarrhea or constipation.  He has no fever or chills.  He has no headache or visual changes.  The patient had repeat CT scan of the chest performed recently and is here for evaluation and discussion of his scan results.  MEDICAL HISTORY: Past Medical History:  Diagnosis Date   Anxiety    Complication of anesthesia    " sometimes I wake up during surgery "   Depression    Left renal mass 07/19/2016   Lung cancer ([place]) dx'd 2010    NSCL CA right     Lung cancer ([place]) dx'd 2017   left    ALLERGIES:  has No Known Allergies.  MEDICATIONS:  Current Outpatient Medications  Medication Sig Dispense Refill   acetaminophen (TYLENOL) 500 MG tablet Take by mouth.     albuterol (PROVENTIL HFA;VENTOLIN HFA) 108 (90 BASE) MCG/ACT inhaler Inhale 2 puffs into the lungs every 6 (six) hours as needed for wheezing or shortness of breath. 1 Inhaler 2   atorvastatin (LIPITOR) 40 MG tablet Take 1 tablet (40 mg total) by mouth daily. 30 tablet 5   [employer] ASPIRIN EC 325 MG EC tablet TAKE 1 TABLET BY MOUTH EVERY DAY 90 tablet 1   ibuprofen (ADVIL,MOTRIN) 600 MG tablet Take 600 mg by mouth every 6 (six) hours as needed.     Multiple Vitamin (MULTIVITAMIN WITH MINERALS) TABS tablet Take 1 tablet by mouth daily.     Nutritional Supplements (CARNATION BREAKFAST ESSENTIALS PO) Take 1 packet by mouth daily.      Tiotropium Bromide-Olodaterol (STIOLTO RESPIMAT) 2.5-2.5 MCG/ACT AERS Inhale 2 puffs into the lungs daily. (Patient not taking: Reported on 02/27/2018) 1 Inhaler 5   No current facility-administered medications for this visit.     SURGICAL HISTORY:  Past Surgical History:  Procedure Laterality Date   AMPUTATION Right 05/21/2012   Procedure: AMPUTATION BELOW KNEE;  Surgeon: Newt Minion, MD;  Location: [facility];  Service: Orthopedics;  Laterality: Right;   AMPUTATION Right 06/21/2012   Procedure: Revision AMPUTATION BELOW KNEE Right;  Surgeon: Newt Minion, MD;  Location: [facility]  OR;  Service: Orthopedics;  Laterality: Right;  Revision right Below Knee Amputation   COLONOSCOPY W/ BIOPSIES AND POLYPECTOMY     FOOT AMPUTATION Right    traumatic right lower extremity   UPPER JAW     SURGERY FOR INFECTION    REVIEW OF SYSTEMS:  Constitutional: positive for fatigue Eyes: negative Ears, nose, mouth, throat, and face: negative Respiratory: positive for dyspnea on exertion Cardiovascular: negative Gastrointestinal:  negative Genitourinary:negative Integument/breast: negative Hematologic/lymphatic: negative Musculoskeletal:negative Neurological: negative Behavioral/Psych: negative Endocrine: negative Allergic/Immunologic: negative   PHYSICAL EXAMINATION: General appearance: alert, cooperative, fatigued and no distress Head: Normocephalic, without obvious abnormality, atraumatic Neck: no adenopathy Lymph nodes: Cervical, supraclavicular, and axillary nodes normal. Resp: clear to auscultation bilaterally Back: symmetric, no curvature. ROM normal. No CVA tenderness. Cardio: regular rate and rhythm, S1, S2 normal, no murmur, click, rub or gallop GI: soft, non-tender; bowel sounds normal; no masses,  no organomegaly Extremities: extremities normal, atraumatic, no cyanosis or edema Neurologic: Alert and oriented X 3, normal strength and tone. Normal symmetric reflexes. Normal coordination and gait  ECOG PERFORMANCE STATUS: 0 - Asymptomatic  Blood pressure (!) 135/95, pulse 97, temperature 97.8 F (36.6 C), temperature source Oral, resp. rate 18, weight 136 lb (61.7 kg), SpO2 97 %.  LABORATORY DATA: Lab Results  Component Value Date   WBC 5.6 05/26/2018   HGB 14.0 05/26/2018   HCT 43.7 05/26/2018   MCV 88.6 05/26/2018   PLT 374 05/26/2018      Chemistry      Component Value Date/Time   NA 137 05/26/2018 1202   NA 141 07/16/2016 1220   K 4.1 05/26/2018 1202   K 4.0 07/16/2016 1220   CL 104 05/26/2018 1202   CL 104 07/19/2012 1432   CO2 24 05/26/2018 1202   CO2 24 07/16/2016 1220   BUN 12 05/26/2018 1202   BUN 8.8 07/16/2016 1220   CREATININE 1.02 05/26/2018 1202   CREATININE 1.0 07/16/2016 1220      Component Value Date/Time   CALCIUM 8.0 (L) 05/26/2018 1202   CALCIUM 9.3 07/16/2016 1220   ALKPHOS 97 05/26/2018 1202   ALKPHOS 92 07/16/2016 1220   AST 21 05/26/2018 1202   AST 18 07/16/2016 1220   ALT 26 05/26/2018 1202   ALT 14 07/16/2016 1220   BILITOT 0.3 05/26/2018 1202    BILITOT 0.39 07/16/2016 1220       RADIOGRAPHIC STUDIES: Ct Chest W Contrast  Result Date: 05/26/2018 CLINICAL DATA:  Lung cancer diagnosed in 2010. Chemotherapy and radiation therapy completed. Right upper chest pain. EXAM: CT CHEST WITH CONTRAST TECHNIQUE: Multidetector CT imaging of the chest was performed during intravenous contrast administration. CONTRAST:  39mL OMNIPAQUE IOHEXOL 300 MG/ML  SOLN COMPARISON:  CT 02/24/2018 and 08/23/2017. FINDINGS: Cardiovascular: Stable mild atherosclerosis of the aorta, great vessels and coronary arteries. Severe long segment stenosis of both common carotid arteries is again noted, similar to previous chest CT and neck CTA. the heart size is normal. There is no pericardial effusion. Mediastinum/Nodes: There is stable soft tissue thickening in the anterior mediastinum and right perihilar regions attributed to previous radiation therapy. No discretely enlarged mediastinal, hilar or axillary lymph nodes are seen. Stable mild diffuse esophageal wall thickening, likely related to previous radiation therapy. Lungs/Pleura: There is no pleural effusion or pneumothorax. Moderate centrilobular emphysema. As above, there is stable chronic volume loss, bronchiectasis and paramediastinal scarring in both upper lobes attributed to radiation therapy. On today study, there are overall lower lung volumes with resulting  mosaic attenuation in both lungs, likely due to atelectasis. The new left apical nodularity on the most recent study has not significantly changed with a 10 x 6 mm component on image 26/7 and a 7 mm component on image 19/7. The right lung nodule (which is located in the MIDDLE lobe) appears slightly larger, measuring 11 x 8 mm on image 57/7 (previously 11 x 4 mm). No other new or enlarging nodules. Upper abdomen: The visualized upper abdomen appears stable without significant findings. Musculoskeletal/Chest wall: There are stable lytic lesions and a chronic pathologic  fracture involving the right aspect of the sternal manubrium. This may be related to previous radiation therapy. No chest wall mass or new osseous findings. IMPRESSION: 1. Progressive enlargement of an irregular spiculated right middle lobe nodule, suspicious for primary lung cancer. 2. The left atypical nodularity has not significantly changed over the last 3 months, but progressed from older prior studies. These are less specific, although potentially neoplastic as well, and are near the recurrence demonstrated on PET-CT 08/16/2014. Consider follow-up PET-CT. 3. Otherwise stable paramediastinal radiation changes in both upper lobes and underlying emphysema. Overall lung volumes are lower on the current study. 4. Stable lytic lesions and chronic pathologic fracture of the right sternal manubrium. 5. Stable long segment common carotid stenosis bilaterally, likely related to radiation therapy. Electronically Signed   By: Richardean Sale M.D.   On: 05/26/2018 19:25    ASSESSMENT AND PLAN:  This is a very pleasant 68 years old African-American male with history of 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 The patient has disease recurrence on the left side presented as a stage IIIa non-small cell lung cancer, squamous cell carcinoma status post concurrent chemoradiation followed by consolidation chemotherapy. Completed in March 2017. The patient has been on observation since that time. Repeat CT scan of the chest performed recently showed no significant changes in the left apical nodularity but there was progressive enlargement of an irregular spiculated right middle lobe nodule suspicious for primary lung cancer. I personally and independently reviewed the scan images and discussed the result and showed the images to the patient today. I recommended for the patient to see Dr. [name] for  consideration of SBRT to the enlarging right middle lobe lung nodule. I will see the patient back for follow-up visit in 4 months for evaluation or repeat CT scan of the chest for restaging of his disease. For hypertension, the patient will continue his current blood pressure medication. The patient was advised to call immediately if he has any concerning symptoms in the interval. All questions were answered. The patient knows to call the clinic with any problems, questions or concerns. We can certainly see the patient much sooner if necessary.   Disclaimer: This note was dictated with voice recognition software. Similar sounding words can inadvertently be transcribed and may not be corrected upon review.

## 2018-05-30 ENCOUNTER — Telehealth: Payer: Self-pay | Admitting: Internal Medicine

## 2018-05-30 NOTE — Telephone Encounter (Signed)
Tried to reach voicemail was not setup

## 2018-06-19 NOTE — Progress Notes (Signed)
Thoracic Location of Tumor / Histology:  1) stage IIIA (T2a, N2, M0) non-small cell lung cancer, squamous cell carcinoma of the left upper lobe diagnosed in August 2016 2)  Stage IIIA (T2 N2 Mx) non-small cell lung cancer, adenocarcinoma, involving the right upper lobe and mediastinal lymphadenopathy diagnosed in June 2010.   Patient had a repeat CT scan of the chest revealed progressive enlargement of an irregular spiculated right middle lobe nodule suspicious for primary lung cancer.    Tobacco/Marijuana/Snuff/ETOH use: Former smoker. Quit 06/21/2014 after 45 year hx of smoking half a ppd.  Past/Anticipated interventions by cardiothoracic surgery, if any: no  Past/Anticipated interventions by medical oncology, if any:  1. Status post concurrent chemoradiation with weekly carboplatin and paclitaxel; last dose was given April 08, 2008. 2. Status post 3 cycles of consolidation chemotherapy with docetaxel; last dose was given on Jun 26, 2008. 3. Concurrent chemoradiation with weekly carboplatin for AUC of 2 and paclitaxel 45 MG/M2. First dose expected on 09/23/2014 status post 7 cycles. Last dose was given 12/01/2014 with partial response. 4. Consolidation chemotherapy with carboplatin for AUC of 5 and paclitaxel 175 MG/M2 every 3 weeks with Neulasta support. First dose 01/02/2015. Status post 3 cycles.  Signs/Symptoms  Weight changes, if any: Yes but unable to quantify.   Respiratory complaints, if any: Reports increasing SOB with exertion. Reports a productive cough with thick white sputum.   Hemoptysis, if any: Denies  Pain issues, if any:  Reports intermittent mild chest pain on the left side. Reports managing this pain with Tylenol and aspirin.   Denies night sweats  Reports increasing fatigue.  SAFETY ISSUES:  Prior radiation? Yes Radiation treatment dates:   09/23/2014-11/08/2014  Site/dose:   The Left lung tumor was treated to 66 Gy in 33 fractions of 2 Gy  Beams/energy:    TomoTherapy was used to deliver helical 6 MV IMRT due to previous right hemithorax radiation.  IGRT was done with pre-fraction MV-CT.   Pacemaker/ICD? no   Possible current pregnancy?no, male patient  Is the patient on methotrexate? no  Current Complaints / other details:  68 year old male. Single. Denies transportation needs.

## 2018-06-20 ENCOUNTER — Ambulatory Visit
Admission: RE | Admit: 2018-06-20 | Discharge: 2018-06-20 | Disposition: A | Discharge status: Home / Self Care | Payer: Medicare PPO | Source: Ambulatory Visit | Attending: Radiation Oncology | Admitting: Radiation Oncology

## 2018-06-20 ENCOUNTER — Other Ambulatory Visit: Payer: Self-pay

## 2018-06-20 ENCOUNTER — Encounter: Payer: Self-pay | Admitting: Radiation Oncology

## 2018-06-20 DIAGNOSIS — C342 Malignant neoplasm of middle lobe, bronchus or lung: Secondary | ICD-10-CM

## 2018-06-20 DIAGNOSIS — C3412 Malignant neoplasm of upper lobe, left bronchus or lung: Secondary | ICD-10-CM

## 2018-06-20 DIAGNOSIS — R911 Solitary pulmonary nodule: Secondary | ICD-10-CM | POA: Insufficient documentation

## 2018-06-20 DIAGNOSIS — C3411 Malignant neoplasm of upper lobe, right bronchus or lung: Secondary | ICD-10-CM

## 2018-06-20 NOTE — Progress Notes (Signed)
See progress note under physician encounter. 

## 2018-06-20 NOTE — Progress Notes (Signed)
Radiation Oncology         [phone] ________________________________  Initial Outpatient Consultation - Conducted via telephone due to current COVID-19 concerns for limiting patient exposure  Name: Evan [name] MRN: [id number]  Date of Service: 06/20/2018 DOB: Dec 29, 1950  [id number], Provider, MD  Curt Bears, MD   REFERRING PHYSICIAN: Curt Bears, MD  DIAGNOSIS: 68 y.o. male with an enlarging right middle lobe lung nodule concerning for recurrence of non-small cell lung cancer    ICD-10-CM   1. 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 Nodule of middle lobe of right lung R91.1     HISTORY OF PRESENT ILLNESS: Evan [name] is a 68 y.o. male seen at the request of Dr. Julien Nordmann. He has a history of Stage IIIA (T2 N2 Mx) non-small cell lung cancer, adenocarcinoma, involving the right upper lobe and mediastinal lymphadenopathy initially diagnosed in 2010 and treated with concurrent chemoradiation followed by consolidation chemotherapy which was completed 06/26/2008. He also has a history of stage IIIA (T2a, N2, M0) non-small cell lung cancer, squamous cell carcinoma of the left upper lobe diagnosed in August 2016 and treated with concurrent chemoradiation followed by consolidation chemotherapy which was completed in February 2017. He has been under observation since that time with stable disease on surveillance imaging, until January 2020, when his follow-up CT chest from 02/24/2018 showed interval enlargement of two left apical lung nodules and enlargement of a right upper lobe nodule but without enlarged mediastinal or hilar lymph nodes. A repeat chest CT scan on 05/26/2018 showed progressive enlargement of an irregular, spiculated right middle lobe nodule, measuring 11 x 8 mm, suspicious for primary lung  cancer. The left atypical nodularity had not significantly changed over the prior 3 months, but progressed from older prior studies. These are less specific but are near the recurrence demonstrated on PET-CT in 2016. Otherwise stable paramediastinal radiation changes in both upper lobes and underlying emphysema.            The patient reviewed imaging results with Dr. Julien Nordmann and has kindly been referred today for consideration of SBRT to the enlarging right middle lobe lung nodule.  PREVIOUS RADIATION THERAPY: Yes - 09/23/2014-11/08/2014: The Left upper lung tumor was treated to 66 Gy in 33 fractions of 2 Gy, concurrent with chemotherapy.  2010: Right upper lung (concurrent with systemic chemotherapy):   PAST MEDICAL HISTORY:  Past Medical History:  Diagnosis Date   Anxiety    Complication of anesthesia    " sometimes I wake up during surgery "   Depression    Left renal mass 07/19/2016   Lung cancer ([place]) dx'd 2010    NSCL CA right   Lung cancer ([facility]) dx'd 2017   left      PAST SURGICAL HISTORY: Past Surgical History:  Procedure Laterality Date   AMPUTATION Right 05/21/2012   Procedure: AMPUTATION BELOW KNEE;  Surgeon: Newt Minion, MD;  Location: [facility];  Service: Orthopedics;  Laterality: Right;   AMPUTATION Right 06/21/2012   Procedure: Revision AMPUTATION BELOW KNEE Right;  Surgeon: Newt Minion, MD;  Location: [facility];  Service: Orthopedics;  Laterality: Right;  Revision right Below Knee Amputation   COLONOSCOPY W/ BIOPSIES AND POLYPECTOMY     FOOT AMPUTATION Right    traumatic right  lower extremity   UPPER JAW     SURGERY FOR INFECTION    FAMILY HISTORY:  Family History  Problem Relation Age of Onset   Heart disease Mother    Diabetes Mother    Prostate cancer Father     SOCIAL HISTORY:  Social History   Socioeconomic History   Marital status: Single    Spouse name: Not on file   Number of children: Not on file   Years of education: Not on  file   Highest education level: Not on file  Occupational History   Not on file  Social Needs   Financial resource strain: Not on file   Food insecurity:    Worry: Not on file    Inability: Not on file   Transportation needs:    Medical: Not on file    Non-medical: Not on file  Tobacco Use   Smoking status: Former Smoker    Packs/day: 0.50    Years: 45.00    Pack years: 22.50    Types: Cigarettes    Last attempt to quit: 06/21/2014    Years since quitting: 4.0   Smokeless tobacco: Never Used  Substance and Sexual Activity   Alcohol use: No    Alcohol/week: 0.0 standard drinks    Comment: rare   Drug use: No   Sexual activity: Not Currently  Lifestyle   Physical activity:    Days per week: Not on file    Minutes per session: Not on file   Stress: Not on file  Relationships   Social connections:    Talks on phone: Not on file    Gets together: Not on file    Attends religious service: Not on file    Active member of club or organization: Not on file    Attends meetings of clubs or organizations: Not on file    Relationship status: Not on file   Intimate partner violence:    Fear of current or ex partner: Not on file    Emotionally abused: Not on file    Physically abused: Not on file    Forced sexual activity: Not on file  Other Topics Concern   Not on file  Social History Narrative   Not on file    ALLERGIES: Patient has no known allergies.  MEDICATIONS:  Current Outpatient Medications  Medication Sig Dispense Refill   acetaminophen (TYLENOL) 500 MG tablet Take by mouth.     [employer] ASPIRIN EC 325 MG EC tablet TAKE 1 TABLET BY MOUTH EVERY DAY 90 tablet 1   Multiple Vitamin (MULTIVITAMIN WITH MINERALS) TABS tablet Take 1 tablet by mouth daily.     Nutritional Supplements (CARNATION BREAKFAST ESSENTIALS PO) Take 1 packet by mouth daily.      albuterol (PROVENTIL HFA;VENTOLIN HFA) 108 (90 BASE) MCG/ACT inhaler Inhale 2 puffs into the lungs every  6 (six) hours as needed for wheezing or shortness of breath. (Patient not taking: Reported on 05/29/2018) 1 Inhaler 2   ibuprofen (ADVIL,MOTRIN) 600 MG tablet Take 600 mg by mouth every 6 (six) hours as needed.     Tiotropium Bromide-Olodaterol (STIOLTO RESPIMAT) 2.5-2.5 MCG/ACT AERS Inhale 2 puffs into the lungs daily. (Patient not taking: Reported on 02/27/2018) 1 Inhaler 5   No current facility-administered medications for this encounter.     REVIEW OF SYSTEMS:  On review of systems, the patient reports that he is doing well overall. He denies any fevers, chills, or night sweats. He reports increasing fatigue. He reports  intermittent mild chest pain on the left side, managed with Tylenol and Aspirin. He reports increasing shortness of breath with exertion and productive cough with thick white sputum, exacerbated by allergies. He denies hemoptysis. He reports weight changes but unable to quantify. He denies any bowel or bladder disturbances, and denies abdominal pain or nausea. He reports frequent vomiting after harsh coughing episodes. He denies any new musculoskeletal or joint aches or pains. A complete review of systems is obtained and is otherwise negative.  PHYSICAL EXAM: Not performed in light of telephone consultation.  Wt Readings from Last 3 Encounters:  05/29/18 136 lb (61.7 kg)  02/27/18 133 lb 11.2 oz (60.6 kg)  08/25/17 128 lb 1.6 oz (58.1 kg)   Temp Readings from Last 3 Encounters:  05/29/18 97.8 F (36.6 C) (Oral)  02/27/18 98.1 F (36.7 C) (Oral)  08/25/17 97.9 F (36.6 C) (Oral)   BP Readings from Last 3 Encounters:  05/29/18 (!) 135/95  02/27/18 (!) 141/93  08/25/17 121/79   Pulse Readings from Last 3 Encounters:  05/29/18 97  02/27/18 84  08/25/17 89   Pain Assessment Pain Score: 2  Pain Frequency: Intermittent Pain Loc: Chest/10   LABORATORY DATA:  Lab Results  Component Value Date   WBC 5.6 05/26/2018   HGB 14.0 05/26/2018   HCT 43.7 05/26/2018    MCV 88.6 05/26/2018   PLT 374 05/26/2018   Lab Results  Component Value Date   NA 137 05/26/2018   K 4.1 05/26/2018   CL 104 05/26/2018   CO2 24 05/26/2018   Lab Results  Component Value Date   ALT 26 05/26/2018   AST 21 05/26/2018   ALKPHOS 97 05/26/2018   BILITOT 0.3 05/26/2018     RADIOGRAPHY: Ct Chest W Contrast  Result Date: 05/26/2018 CLINICAL DATA:  Lung cancer diagnosed in 2010. Chemotherapy and radiation therapy completed. Right upper chest pain. EXAM: CT CHEST WITH CONTRAST TECHNIQUE: Multidetector CT imaging of the chest was performed during intravenous contrast administration. CONTRAST:  41mL OMNIPAQUE IOHEXOL 300 MG/ML  SOLN COMPARISON:  CT 02/24/2018 and 08/23/2017. FINDINGS: Cardiovascular: Stable mild atherosclerosis of the aorta, great vessels and coronary arteries. Severe long segment stenosis of both common carotid arteries is again noted, similar to previous chest CT and neck CTA. the heart size is normal. There is no pericardial effusion. Mediastinum/Nodes: There is stable soft tissue thickening in the anterior mediastinum and right perihilar regions attributed to previous radiation therapy. No discretely enlarged mediastinal, hilar or axillary lymph nodes are seen. Stable mild diffuse esophageal wall thickening, likely related to previous radiation therapy. Lungs/Pleura: There is no pleural effusion or pneumothorax. Moderate centrilobular emphysema. As above, there is stable chronic volume loss, bronchiectasis and paramediastinal scarring in both upper lobes attributed to radiation therapy. On today study, there are overall lower lung volumes with resulting mosaic attenuation in both lungs, likely due to atelectasis. The new left apical nodularity on the most recent study has not significantly changed with a 10 x 6 mm component on image 26/7 and a 7 mm component on image 19/7. The right lung nodule (which is located in the MIDDLE lobe) appears slightly larger, measuring 11 x  8 mm on image 57/7 (previously 11 x 4 mm). No other new or enlarging nodules. Upper abdomen: The visualized upper abdomen appears stable without significant findings. Musculoskeletal/Chest wall: There are stable lytic lesions and a chronic pathologic fracture involving the right aspect of the sternal manubrium. This may be related to previous radiation therapy.  No chest wall mass or new osseous findings. IMPRESSION: 1. Progressive enlargement of an irregular spiculated right middle lobe nodule, suspicious for primary lung cancer. 2. The left atypical nodularity has not significantly changed over the last 3 months, but progressed from older prior studies. These are less specific, although potentially neoplastic as well, and are near the recurrence demonstrated on PET-CT 08/16/2014. Consider follow-up PET-CT. 3. Otherwise stable paramediastinal radiation changes in both upper lobes and underlying emphysema. Overall lung volumes are lower on the current study. 4. Stable lytic lesions and chronic pathologic fracture of the right sternal manubrium. 5. Stable long segment common carotid stenosis bilaterally, likely related to radiation therapy. Electronically Signed   By: Richardean Sale M.D.   On: 05/26/2018 19:25      IMPRESSION/PLAN: This visit was conducted via telephone to spare the patient unnecessary potential exposure in the healthcare setting during the current COVID-19 pandemic. 1. 68 y.o. male with an enlarging right middle lobe lung nodule concerning for recurrence of non-small cell lung cancer. Today, we talked to the patient about the findings and workup thus far. We discussed the natural history of non-small cell lung carcinoma and general treatment, highlighting the role of radiotherapy in the management. The patient was encouraged to ask questions that were answered to his satisfaction. At this time, we recommend proceeding with PET scan for further evaluation and will plan to present his case in the  multidisciplinary lung conference to discuss the potential for biopsy and/or treatment options. We will follow up with the patient by phone following review of his imaging and move forward with biopsy versus treatment planning accordingly at that time. The patient is in agreement with this recommendation and would like to proceed with PET scan in the near future.   Given current concerns for patient exposure during the COVID-19 pandemic, this encounter was conducted via telephone. The patient was notified in advance and was offered a [facility] meeting to allow for face to face communication but unfortunately reported that he did not have the appropriate resources/technology to support such a visit and instead preferred to proceed with telephone consult.  The patient has given verbal consent for this type of encounter. The time spent during this encounter was 45 minutes. The attendants for this meeting include Tyler Pita MD, Ashlyn Bruning PA-C, [place], and patient, Evan [name]. During the encounter, Tyler Pita MD, Ashlyn Bruning PA-C, and scribe, Rae Lips were located at [facility] Radiation Oncology Department.  Patient, Evan [name] was located at home.   Nicholos Johns, PA-C    Tyler Pita, MD  [facility] Direct Dial: [phone]   Fax: [phone] [email or web address]   Skype   [email or web address]  This document serves as a record of services personally performed by Tyler Pita, MD and Freeman Caldron, PA-C. It was created on their behalf by Rae Lips, a trained medical scribe. The creation of this record is based on the scribe's personal observations and the providers' statements to them. This document has been checked and approved by the attending providers.

## 2018-06-21 ENCOUNTER — Telehealth: Payer: Self-pay | Admitting: *Deleted

## 2018-06-21 NOTE — Telephone Encounter (Signed)
Called patient to inform of Pet Scan for 06-29-18- arrival time- 4:45 pm @ [facility], patient to be NPO- 6 hrs. prior to test, patient to have water only, and patient to avoid carbs, patient to receive a phone call on 06-30-18 for results, spoke with patient and he verified understanding this

## 2018-06-29 ENCOUNTER — Ambulatory Visit (HOSPITAL_COMMUNITY)
Admission: RE | Admit: 2018-06-29 | Discharge: 2018-06-29 | Disposition: A | Discharge status: Home / Self Care | Payer: Medicare PPO | Source: Ambulatory Visit | Attending: Urology | Admitting: Urology

## 2018-06-29 ENCOUNTER — Other Ambulatory Visit: Payer: Self-pay

## 2018-06-29 DIAGNOSIS — I700 Atherosclerosis of aorta: Secondary | ICD-10-CM | POA: Diagnosis not present

## 2018-06-29 DIAGNOSIS — C3411 Malignant neoplasm of upper lobe, right bronchus or lung: Secondary | ICD-10-CM

## 2018-06-29 DIAGNOSIS — N400 Enlarged prostate without lower urinary tract symptoms: Secondary | ICD-10-CM | POA: Diagnosis not present

## 2018-06-29 DIAGNOSIS — C3412 Malignant neoplasm of upper lobe, left bronchus or lung: Secondary | ICD-10-CM | POA: Diagnosis present

## 2018-06-29 LAB — GLUCOSE, CAPILLARY: Glucose-Capillary: 96 mg/dL (ref 70–99)

## 2018-06-29 MED ORDER — FLUDEOXYGLUCOSE F - 18 (FDG) INJECTION
6.7500 | Freq: Once | INTRAVENOUS | Status: AC | PRN
  Administered 2018-06-29: 12:00:00 6.75 via INTRAVENOUS

## 2018-06-30 ENCOUNTER — Ambulatory Visit: Payer: Medicare PPO | Attending: Radiation Oncology

## 2018-06-30 ENCOUNTER — Ambulatory Visit: Payer: Self-pay | Admitting: Radiation Oncology

## 2018-07-06 ENCOUNTER — Other Ambulatory Visit: Payer: Self-pay | Admitting: *Deleted

## 2018-07-06 ENCOUNTER — Ambulatory Visit: Payer: Self-pay | Admitting: Urology

## 2018-07-06 NOTE — Progress Notes (Signed)
The proposed treatment discussed in cancer conference 07/06/2018 is for discussion purpose only and is not a binding recommendation.  The patient was not physically examined nor present for their treatment options.  Therefore, final treatment plans cannot be decided.

## 2018-07-07 ENCOUNTER — Ambulatory Visit
Admission: RE | Admit: 2018-07-07 | Discharge: 2018-07-07 | Disposition: A | Discharge status: Home / Self Care | Payer: Medicare PPO | Source: Ambulatory Visit | Attending: Urology | Admitting: Urology

## 2018-07-07 ENCOUNTER — Other Ambulatory Visit: Payer: Self-pay

## 2018-07-07 DIAGNOSIS — C3411 Malignant neoplasm of upper lobe, right bronchus or lung: Secondary | ICD-10-CM

## 2018-07-07 DIAGNOSIS — R911 Solitary pulmonary nodule: Secondary | ICD-10-CM

## 2018-07-07 DIAGNOSIS — C3412 Malignant neoplasm of upper lobe, left bronchus or lung: Secondary | ICD-10-CM

## 2018-07-07 NOTE — Progress Notes (Signed)
Radiation Oncology         [phone] ________________________________  Follow Up New - Conducted via telephone due to current COVID-19 concerns for limiting patient exposure  Name: Evan [name] MRN: [id number]  Date of Service: 07/07/2018 DOB: December 05, 1950  QQ:[id number], Provider, MD  Curt Bears, MD   REFERRING PHYSICIAN: Curt Bears, MD  DIAGNOSIS: 68 y.o. male with an enlarging right middle lobe lung nodule concerning for recurrence of non-small cell lung cancer  No diagnosis found.  HISTORY OF PRESENT ILLNESS: Evan [name] is a 68 y.o. male seen at the request of Dr. Julien Nordmann. He has a history of Stage IIIA (T2 N2 Mx) non-small cell lung cancer, adenocarcinoma, involving the right upper lobe and mediastinal lymphadenopathy initially diagnosed in 2010 and treated with concurrent chemoradiation followed by consolidation chemotherapy which was completed 06/26/2008. He also has a history of stage IIIA (T2a, N2, M0) non-small cell lung cancer, squamous cell carcinoma of the left upper lobe diagnosed in August 2016 and treated with concurrent chemoradiation followed by consolidation chemotherapy which was completed in February 2017. He has been under observation since that time with stable disease on surveillance imaging, until January 2020, when his follow-up CT chest from 02/24/2018 showed interval enlargement of two left apical lung nodules and enlargement of a right upper lobe nodule but without enlarged mediastinal or hilar lymph nodes. A repeat chest CT scan on 05/26/2018 showed progressive enlargement of an irregular, spiculated right middle lobe nodule, measuring 11 x 8 mm, suspicious for primary lung cancer. The left atypical nodularity had not significantly changed over the prior 3 months, but progressed from older prior studies. These are less specific but are near the recurrence demonstrated on PET-CT in 2016. Otherwise stable paramediastinal radiation changes in both upper lobes  and underlying emphysema.            He underwent PET scan on 06/29/2018 for further evaluation and this confirmed a hypermetabolic 11 mm irregular posterior right middle lobe pulmonary nodule as well as a hypermetabolic 10 mm anterior left upper lobe nodule with curvilinear scarring, concerning for recurrent or metastatic disease.  There is a second 7 mm left apical nodule that is not hypermetabolic as well as a small focus of asymmetric hypermetabolism identified in the posterior left nasopharynx and focal hypermetabolism in an enlarged prostate gland towards he apex.   His case was presented and discussed at the recent Multidisciplinary Thoracic Oncology Conference on 07/06/2018. The consensus recommendation is to proceed with SBRT to the hypermetabolic enlarging right middle lobe lung nodule as well as the 1 cm hypermetabolic left upper lobe lung nodule. Due to the small size and location of these lesions, no biopsy was recommended.  The telephone follow up visit scheduled today is to discuss the findings and recommendations from tumor board.  PREVIOUS RADIATION THERAPY: Yes - 09/23/2014-11/08/2014: The Left upper lung tumor was treated to 66 Gy in 33 fractions of 2 Gy, concurrent with chemotherapy.  2010: Right upper lung (concurrent with systemic chemotherapy):   PAST MEDICAL HISTORY:  Past Medical History:  Diagnosis Date   Anxiety    Complication of anesthesia    " sometimes I wake up during surgery "   Depression    Left renal mass 07/19/2016   Lung cancer ([place]) dx'd 2010    NSCL CA right   Lung cancer ([place]) dx'd 2017   left      PAST SURGICAL HISTORY: Past Surgical History:  Procedure Laterality Date   AMPUTATION  Right 05/21/2012   Procedure: AMPUTATION BELOW KNEE;  Surgeon: Newt Minion, MD;  Location: [facility];  Service: Orthopedics;  Laterality: Right;   AMPUTATION Right 06/21/2012   Procedure: Revision AMPUTATION BELOW KNEE Right;  Surgeon: Newt Minion, MD;   Location: [facility];  Service: Orthopedics;  Laterality: Right;  Revision right Below Knee Amputation   COLONOSCOPY W/ BIOPSIES AND POLYPECTOMY     FOOT AMPUTATION Right    traumatic right lower extremity   UPPER JAW     SURGERY FOR INFECTION    FAMILY HISTORY:  Family History  Problem Relation Age of Onset   Heart disease Mother    Diabetes Mother    Prostate cancer Father     SOCIAL HISTORY:  Social History   Socioeconomic History   Marital status: Single    Spouse name: Not on file   Number of children: Not on file   Years of education: Not on file   Highest education level: Not on file  Occupational History   Not on file  Social Needs   Financial resource strain: Not on file   Food insecurity:    Worry: Not on file    Inability: Not on file   Transportation needs:    Medical: Not on file    Non-medical: Not on file  Tobacco Use   Smoking status: Former Smoker    Packs/day: 0.50    Years: 45.00    Pack years: 22.50    Types: Cigarettes    Last attempt to quit: 06/21/2014    Years since quitting: 4.0   Smokeless tobacco: Never Used  Substance and Sexual Activity   Alcohol use: No    Alcohol/week: 0.0 standard drinks    Comment: rare   Drug use: No   Sexual activity: Not Currently  Lifestyle   Physical activity:    Days per week: Not on file    Minutes per session: Not on file   Stress: Not on file  Relationships   Social connections:    Talks on phone: Not on file    Gets together: Not on file    Attends religious service: Not on file    Active member of club or organization: Not on file    Attends meetings of clubs or organizations: Not on file    Relationship status: Not on file   Intimate partner violence:    Fear of current or ex partner: Not on file    Emotionally abused: Not on file    Physically abused: Not on file    Forced sexual activity: Not on file  Other Topics Concern   Not on file  Social History Narrative    Not on file    ALLERGIES: Patient has no known allergies.  MEDICATIONS:  Current Outpatient Medications  Medication Sig Dispense Refill   acetaminophen (TYLENOL) 500 MG tablet Take by mouth.     albuterol (PROVENTIL HFA;VENTOLIN HFA) 108 (90 BASE) MCG/ACT inhaler Inhale 2 puffs into the lungs every 6 (six) hours as needed for wheezing or shortness of breath. (Patient not taking: Reported on 05/29/2018) 1 Inhaler 2   [employer] ASPIRIN EC 325 MG EC tablet TAKE 1 TABLET BY MOUTH EVERY DAY 90 tablet 1   ibuprofen (ADVIL,MOTRIN) 600 MG tablet Take 600 mg by mouth every 6 (six) hours as needed.     Multiple Vitamin (MULTIVITAMIN WITH MINERALS) TABS tablet Take 1 tablet by mouth daily.     Nutritional Supplements (CARNATION BREAKFAST ESSENTIALS PO)  Take 1 packet by mouth daily.      Tiotropium Bromide-Olodaterol (STIOLTO RESPIMAT) 2.5-2.5 MCG/ACT AERS Inhale 2 puffs into the lungs daily. (Patient not taking: Reported on 02/27/2018) 1 Inhaler 5   No current facility-administered medications for this encounter.     REVIEW OF SYSTEMS:  On review of systems, the patient reports that he is doing well overall. He denies any fevers, chills, or night sweats. He reports increasing fatigue. He reports intermittent mild chest pain on the left side, managed with Tylenol and Aspirin. He reports persistent shortness of breath with exertion and productive cough with thick white sputum, exacerbated by allergies but denies hemoptysis. He reports decreased appetite with associated weight loss (unable to quantify) and decrease in muscle mass. He denies any bowel or bladder disturbances, and denies abdominal pain or nausea. He reports frequent vomiting after harsh coughing episodes, unchanged recently. He denies any new musculoskeletal or joint aches or pains. A complete review of systems is obtained and is otherwise negative.  PHYSICAL EXAM: Not performed in light of telephone consultation.  Wt Readings from Last 3  Encounters:  05/29/18 136 lb (61.7 kg)  02/27/18 133 lb 11.2 oz (60.6 kg)  08/25/17 128 lb 1.6 oz (58.1 kg)   Temp Readings from Last 3 Encounters:  05/29/18 97.8 F (36.6 C) (Oral)  02/27/18 98.1 F (36.7 C) (Oral)  08/25/17 97.9 F (36.6 C) (Oral)   BP Readings from Last 3 Encounters:  05/29/18 (!) 135/95  02/27/18 (!) 141/93  08/25/17 121/79   Pulse Readings from Last 3 Encounters:  05/29/18 97  02/27/18 84  08/25/17 89    LABORATORY DATA:  Lab Results  Component Value Date   WBC 5.6 05/26/2018   HGB 14.0 05/26/2018   HCT 43.7 05/26/2018   MCV 88.6 05/26/2018   PLT 374 05/26/2018   Lab Results  Component Value Date   NA 137 05/26/2018   K 4.1 05/26/2018   CL 104 05/26/2018   CO2 24 05/26/2018   Lab Results  Component Value Date   ALT 26 05/26/2018   AST 21 05/26/2018   ALKPHOS 97 05/26/2018   BILITOT 0.3 05/26/2018     RADIOGRAPHY: Nm Pet Image Restag (ps) Skull Base To Thigh  Result Date: 06/29/2018 CLINICAL DATA:  Subsequent treatment strategy for right upper lobe non-small cell lung cancer. EXAM: NUCLEAR MEDICINE PET SKULL BASE TO THIGH TECHNIQUE: 6.8 mCi F-18 FDG was injected intravenously. Full-ring PET imaging was performed from the skull base to thigh after the radiotracer. CT data was obtained and used for attenuation correction and anatomic localization. Fasting blood glucose: 96 mg/dl COMPARISON:  Chest CT 05/26/2018.  PET-CT 08/16/2014. FINDINGS: Mediastinal blood pool activity: SUV max 2.0 Liver activity: SUV max NA NECK: Focus of asymmetric hypermetabolism identified in the posterior left nasopharynx, new in the interval with SUV max = 4.1.No hypermetabolic lymph nodes in the neck. Incidental CT findings: none CHEST: No hypermetabolic mediastinal or hilar nodes. 7 mm left apical nodule identified on chest CT of 05/26/2018 shows no hypermetabolism on PET imaging but is below the size threshold for reliable resolution on PET imaging. 10 x 6 mm nodular  component associated with scarring in the medial left upper lobe identified on the recent diagnostic chest CT is hypermetabolic with SUV max = 4.1. Similar low level uptake identified in the right parahilar scarring. 8 x 11 mm irregular posterior right middle lobe nodule identified on the recent diagnostic chest CT is hypermetabolic with SUV max = 3.7.  Incidental CT findings: Atherosclerotic calcification is noted in the wall of the thoracic aorta. Centrilobular emphsyema noted. No pleural effusion. ABDOMEN/PELVIS: No abnormal hypermetabolic activity within the liver, pancreas, adrenal glands, or spleen. No hypermetabolic lymph nodes in the abdomen or pelvis. Focal hypermetabolic uptake identified in the prostate gland towards the apex. Incidental CT findings: There is abdominal aortic atherosclerosis without aneurysm. Mild bladder wall thickening evident. SKELETON: No focal hypermetabolic activity to suggest skeletal metastasis. Incidental CT findings: none IMPRESSION: 1. 11 mm irregular posterior right middle lobe pulmonary nodule is hypermetabolic. This could represent metastatic disease or metachronous primary. 2. 10 mm nodule in the anterior left upper lobe, associated with curvilinear scarring is hypermetabolic, concerning for recurrent or metastatic disease. A second left apically nodule measuring 7 mm shows no hypermetabolism but is below threshold for reliable resolution on PET imaging. 3. Small focus of asymmetric hypermetabolism identified in the posterior left nasopharynx. Direct visualization may be warranted to exclude mucosal lesion. 4. Focal hypermetabolism in the prostate gland towards the apex. Prostate gland is enlarged and if the patient is status post TURP, this could be urine pooling within the TURP defect. As prostate cancer cannot be excluded, correlation with PSA may be warranted. 5. Mild bladder wall thickening. Given prostatomegaly, component of bladder outlet obstruction a consideration.  6.  Aortic Atherosclerois (ICD10-170.0) Electronically Signed   By: Misty Stanley M.D.   On: 06/29/2018 14:50      IMPRESSION/PLAN: This visit was conducted via telephone to spare the patient unnecessary potential exposure in the healthcare setting during the current COVID-19 pandemic. 1. 68 y.o. male with putative recurrence of Stage I NSCLC in an enlarging, hypermetabolic right middle lobe lung nodule and a 1 cm hypermetabolic left upper lobe lung nodule. Today, we reviewed the findings of his recent PET scan and work up thus far. We discussed the natural history of non-small cell carcinoma and general treatment, highlighting the role of radiotherapy in the management. We discussed the available radiation techniques, and focused on the details of logistics and delivery.  His case and imaging were reviewed recently at the multidisciplinary thoracic oncology tumor board and the consensus recommendation was to proceed with a 3 fx course of stereotactic body radiation (SBRT) to the hypermetabolic lesions in the right upper and left upper lobes.  We reviewed the anticipated acute and late sequelae associated with radiation in this setting. The patient was encouraged to ask questions that were answered to his satisfaction.  At the end of our conversation, the patient is comfortable with proceeding with the recommended SBRT treatments to both lungs. He has given verbal consent to proceed and will return on 07/11/2018 at 2 pm for CT simulation in anticipation of beginning treatment the following week. He will receive 3 SBRT treatments to the right upper/middle lobe and left upper lobe nodules.  We will sign formal written consent to proceed at the time of his simulation/treatment planning.  Given current concerns for patient exposure during the COVID-19 pandemic, this encounter was conducted via telephone. The patient was notified in advance and was offered a [place] meeting to allow for face to face communication  but unfortunately reported that he did not have the appropriate resources/technology to support such a visit and instead preferred to proceed with telephone consult.  The patient has given verbal consent for this type of encounter. The time spent during this encounter was 25 minutes with 50% of that time spent in coordinating the patient's care. The attendants for this meeting  include Andris Brothers PA-C, [place], and patient, [name]. During the encounter, Charonda Hefter PA-C, and scribe, Wilburn Mylar were located at [place].  Patient, Evan [name] was located at home.    Nicholos Johns, MMS, PA-C [facility]: [phone]   Fax: [phone]   This document serves as a record of services personally performed by Brittnae Aschenbrenner, PA-C. It was created on her behalf by Wilburn Mylar, a trained medical scribe. The creation of this record is based on the scribe's personal observations and the provider's statements to them. This document has been checked and approved by the attending provider.

## 2018-07-11 ENCOUNTER — Ambulatory Visit
Admission: RE | Admit: 2018-07-11 | Discharge: 2018-07-11 | Disposition: A | Discharge status: Home / Self Care | Payer: Medicare PPO | Source: Ambulatory Visit | Attending: Radiation Oncology | Admitting: Radiation Oncology

## 2018-07-11 ENCOUNTER — Other Ambulatory Visit: Payer: Self-pay

## 2018-07-11 DIAGNOSIS — C3412 Malignant neoplasm of upper lobe, left bronchus or lung: Secondary | ICD-10-CM | POA: Diagnosis not present

## 2018-07-11 DIAGNOSIS — C342 Malignant neoplasm of middle lobe, bronchus or lung: Secondary | ICD-10-CM | POA: Diagnosis not present

## 2018-07-11 DIAGNOSIS — Z510 Encounter for antineoplastic radiation therapy: Secondary | ICD-10-CM | POA: Insufficient documentation

## 2018-07-16 DIAGNOSIS — C342 Malignant neoplasm of middle lobe, bronchus or lung: Secondary | ICD-10-CM | POA: Insufficient documentation

## 2018-07-16 NOTE — Progress Notes (Signed)
  Radiation Oncology         [phone] ________________________________  Name: DEAUNDRE ALLSTON MRN: [id number]  Date: 07/11/2018  DOB: 1950/04/19  STEREOTACTIC BODY RADIOTHERAPY SIMULATION AND TREATMENT PLANNING NOTE    ICD-10-CM   1. Primary cancer of right middle lobe of lung (HCC)  C34.2   2. Primary cancer of left upper lobe of lung (HCC)  C34.12     DIAGNOSIS:  68 yo gentlemen with synchronous bilateral stage I non-small cell lung cancer.  NARRATIVE:  The patient was brought to the [facility].  Identity was confirmed.  All relevant records and images related to the planned course of therapy were reviewed.  The patient freely provided informed written consent to proceed with treatment after reviewing the details related to the planned course of therapy. The consent form was witnessed and verified by the simulation staff.  Then, the patient was set-up in a stable reproducible  supine position for radiation therapy.  A BodyFix immobilization pillow was fabricated for reproducible positioning.  Then I personally applied the abdominal compression paddle to limit respiratory excursion.  4D respiratoy motion management CT images were obtained.  Surface markings were placed.  The CT images were loaded into the planning software.  Then, using Cine, MIP, and standard views, the internal target volume (ITV) and planning target volumes (PTV) were delinieated, and avoidance structures were contoured.  Treatment planning then occurred.  The radiation prescription was entered and confirmed.  A total of two complex treatment devices were fabricated in the form of the BodyFix immobilization pillow and a neck accuform cushion.  I have requested : 3D Simulation  I have requested a DVH of the following structures: Heart, Lungs, Esophagus, Chest Wall, Brachial Plexus, Major Blood Vessels, and targets.  SPECIAL TREATMENT PROCEDURE:  The planned course of therapy using radiation constitutes a  special treatment procedure. Special care is required in the management of this patient for the following reasons. This treatment constitutes a Special Treatment Procedure for the following reason: [ High dose per fraction requiring special monitoring for increased toxicities of treatment including daily imaging..  The special nature of the planned course of radiotherapy will require increased physician supervision and oversight to ensure patient's safety with optimal treatment outcomes.  RESPIRATORY MOTION MANAGEMENT SIMULATION:  In order to account for effect of respiratory motion on target structures and other organs in the planning and delivery of radiotherapy, this patient underwent respiratory motion management simulation.  To accomplish this, when the patient was brought to the CT simulation planning suite, 4D respiratoy motion management CT images were obtained.  The CT images were loaded into the planning software.  Then, using a variety of tools including Cine, MIP, and standard views, the target volume and planning target volumes (PTV) were delineated.  Avoidance structures were contoured.  Treatment planning then occurred.  Dose volume histograms were generated and reviewed for each of the requested structure.  The resulting plan was carefully reviewed and approved today.  PLAN:  The patient will receive 54 Gy in 3 fractions.  ________________________________  [name] [name], M.D.

## 2018-07-17 ENCOUNTER — Other Ambulatory Visit: Payer: Self-pay

## 2018-07-17 ENCOUNTER — Ambulatory Visit
Admission: RE | Admit: 2018-07-17 | Discharge: 2018-07-17 | Disposition: A | Discharge status: Home / Self Care | Payer: Medicare PPO | Source: Ambulatory Visit | Attending: Radiation Oncology | Admitting: Radiation Oncology

## 2018-07-17 DIAGNOSIS — Z510 Encounter for antineoplastic radiation therapy: Secondary | ICD-10-CM | POA: Diagnosis not present

## 2018-07-19 ENCOUNTER — Other Ambulatory Visit: Payer: Self-pay

## 2018-07-19 ENCOUNTER — Ambulatory Visit
Admission: RE | Admit: 2018-07-19 | Discharge: 2018-07-19 | Disposition: A | Discharge status: Home / Self Care | Payer: Medicare PPO | Source: Ambulatory Visit | Attending: Radiation Oncology | Admitting: Radiation Oncology

## 2018-07-19 DIAGNOSIS — Z510 Encounter for antineoplastic radiation therapy: Secondary | ICD-10-CM | POA: Diagnosis not present

## 2018-07-24 ENCOUNTER — Encounter: Payer: Self-pay | Admitting: Radiation Oncology

## 2018-07-24 ENCOUNTER — Ambulatory Visit
Admission: RE | Admit: 2018-07-24 | Discharge: 2018-07-24 | Disposition: A | Discharge status: Home / Self Care | Payer: Medicare PPO | Source: Ambulatory Visit | Attending: Radiation Oncology | Admitting: Radiation Oncology

## 2018-07-24 ENCOUNTER — Other Ambulatory Visit: Payer: Self-pay

## 2018-07-24 DIAGNOSIS — Z510 Encounter for antineoplastic radiation therapy: Secondary | ICD-10-CM | POA: Diagnosis not present

## 2018-08-24 ENCOUNTER — Other Ambulatory Visit: Payer: Self-pay

## 2018-08-24 ENCOUNTER — Ambulatory Visit
Admission: RE | Admit: 2018-08-24 | Discharge: 2018-08-24 | Disposition: A | Discharge status: Home / Self Care | Payer: Medicare PPO | Source: Ambulatory Visit | Attending: Urology | Admitting: Urology

## 2018-08-24 ENCOUNTER — Other Ambulatory Visit: Payer: Self-pay | Admitting: Radiation Therapy

## 2018-08-24 ENCOUNTER — Other Ambulatory Visit: Payer: Self-pay | Admitting: Internal Medicine

## 2018-08-24 ENCOUNTER — Telehealth: Payer: Self-pay | Admitting: Radiation Therapy

## 2018-08-24 DIAGNOSIS — R251 Tremor, unspecified: Secondary | ICD-10-CM

## 2018-08-24 DIAGNOSIS — R259 Unspecified abnormal involuntary movements: Secondary | ICD-10-CM | POA: Insufficient documentation

## 2018-08-24 DIAGNOSIS — C342 Malignant neoplasm of middle lobe, bronchus or lung: Secondary | ICD-10-CM

## 2018-08-24 DIAGNOSIS — C3412 Malignant neoplasm of upper lobe, left bronchus or lung: Secondary | ICD-10-CM

## 2018-08-24 NOTE — Progress Notes (Addendum)
Radiation Oncology         [phone] ________________________________  Name: Evan [name] MRN: [id number]  Date: 08/24/2018  DOB: May 26, 1950  Post Treatment Note  CC: Default, Provider, MD  Curt Bears, MD  Diagnosis:   68 y.o. male with an enlarging right middle lobe lung nodule and 1 cm LUL nodule concerning for recurrence of non-small cell lung cancer  Interval Since Last Radiation:  4 weeks  07/17/18, 07/19/18, 07/24/18//SBRT: The RML and LUL targets were treated to 54 Gy in 3 fractions of 18 Gy  09/23/2014-11/08/2014: The Left upper lung tumor was treated to 66 Gy in 33 fractions of 2 Gy, concurrent with chemotherapy.  2010: Right upper lung (concurrent with systemic chemotherapy):   Narrative:  I spoke with the patient to conduct his routine scheduled 1 month follow up visit via telephone to spare the patient unnecessary potential exposure in the healthcare setting during the current COVID-19 pandemic.  The patient was notified in advance and gave permission to proceed with this visit format. He tolerated radiation treatment relatively well and denied any ill side effects.                             In summary, he has a history of Stage IIIA (T2 N2 Mx) non-small cell lung cancer, adenocarcinoma, involving the right upper lobe and mediastinal lymphadenopathy initially diagnosed in 2010 and treated with concurrent chemoradiation followed by consolidation chemotherapy which was completed 06/26/2008. He also has a history of stage IIIA (T2a, N2, M0) non-small cell lung cancer, squamous cell carcinoma of the left upper lobe diagnosed in August 2016 and treated with concurrent chemoradiation followed by consolidation chemotherapy which was completed in February 2017. He had been under observation since that time with stable disease on surveillance imaging, until January 2020, when his follow-up CT chest from 02/24/2018 showed interval enlargement of two left apical lung nodules and  enlargement of a right upper lobe nodule but without enlarged mediastinal or hilar lymph nodes. A repeat chest CT scan on 05/26/2018 showed progressive enlargement of an irregular, spiculated right middle lobe nodule, measuring 11 x 8 mm, suspicious for primary lung cancer. The left atypical nodularity had not significantly changed over the prior 3 months, but progressed from older prior studies. These were less specific but were near the recurrence demonstrated on PET-CT in 2016. Otherwise, stable paramediastinal radiation changes in both upper lobes and underlying emphysema.  He underwent PET scan on 06/29/2018 for further evaluation and this confirmed a hypermetabolic 11 mm irregular posterior right middle lobe pulmonary nodule as well as a hypermetabolic 10 mm anterior left upper lobe nodule with curvilinear scarring, concerning for recurrent or metastatic disease. There was a second 7 mm left apical nodule that was not hypermetabolic as well as a small focus of asymmetric hypermetabolism identified in the posterior left nasopharynx and focal hypermetabolism in an enlarged prostate gland towards the apex.  His case was presented and discussed at the Multidisciplinary Thoracic Oncology Conference on 07/06/2018. The consensus recommendation was to proceed with SBRT to the hypermetabolic enlarging right middle lobe lung nodule as well as the 1 cm hypermetabolic left upper lobe lung nodule. Due to the small size and location of these lesions, no biopsy was recommended.  The patient was in agreement and completed SBRT treatment to these two lung lesions on 07/24/18, which he tolerated well.  On review of systems, the patient states that he is doing  fair.  He reports occasional episodes of shortness of breath when he is out in the heat.  He has also had some soreness in his chest wall in the areas that were recently treated on the right and left.  He denies chest pain, dyspnea, productive cough or hemoptysis.  He has  noticed gradual improvement in his appetite recently but admits that he has lost weight over the past month- cannot quantify.  He denies fevers, chills or night sweats.  He reports 3-4 episodes of "uncontrollable shaking all over" and feeling like he "is about to go out" over the past month.  He reports that these resolve spontaneously and he does feel tired afterwards but denies any actual syncopal episodes or loss of consciousness.  He denies any numbness, tingling, focal weakness or imbalance and has not had recent headaches, difficulty with speech or facial droop.  He reports a history of carotid artery stenosis which was evaluated at [facility] last year but no intervention was recommended at that time.  He feels like he has noticed intermittent "fullness or swelling in the neck veins" ongoing for quite some time and unchanged recently.  ALLERGIES:  has No Known Allergies.  Meds: Current Outpatient Medications  Medication Sig Dispense Refill   acetaminophen (TYLENOL) 500 MG tablet Take by mouth.     albuterol (PROVENTIL HFA;VENTOLIN HFA) 108 (90 BASE) MCG/ACT inhaler Inhale 2 puffs into the lungs every 6 (six) hours as needed for wheezing or shortness of breath. (Patient not taking: Reported on 05/29/2018) 1 Inhaler 2   [employer] ASPIRIN EC 325 MG EC tablet TAKE 1 TABLET BY MOUTH EVERY DAY 90 tablet 1   ibuprofen (ADVIL,MOTRIN) 600 MG tablet Take 600 mg by mouth every 6 (six) hours as needed.     Multiple Vitamin (MULTIVITAMIN WITH MINERALS) TABS tablet Take 1 tablet by mouth daily.     Nutritional Supplements (CARNATION BREAKFAST ESSENTIALS PO) Take 1 packet by mouth daily.      Tiotropium Bromide-Olodaterol (STIOLTO RESPIMAT) 2.5-2.5 MCG/ACT AERS Inhale 2 puffs into the lungs daily. (Patient not taking: Reported on 02/27/2018) 1 Inhaler 5   No current facility-administered medications for this encounter.     Physical Findings:  vitals were not taken for this visit.   /Unable to assess due to  telephone follow up visit format.   Lab Findings: Lab Results  Component Value Date   WBC 5.6 05/26/2018   HGB 14.0 05/26/2018   HCT 43.7 05/26/2018   MCV 88.6 05/26/2018   PLT 374 05/26/2018     Radiographic Findings: No results found.  Impression/Plan: 1. 68 y.o. male with an enlarging right middle lobe lung nodule and 1 cm LUL nodule concerning for recurrence of non-small cell lung cancer. He appears to be recovering well from the effects of radiation.  We discussed the plan to obtain a follow-up CT chest scan to assess his treatment response in the next 1 to 2 weeks and I will plan to call him with those results.  If this scan is stable, he will continue in routine follow up under the care and direction of Dr. Julien Nordmann with serial CT chest scans every 3 to 6 months for monitoring and management his systemic disease.  He appears to have a good understanding of his overall disease and our recommendations and is comfortable and in agreement with the stated plan.  He knows to call at anytime with any questions or concerns.  2. Tremors/questionable seizure activity.  The patient reports 3-4 episodes  of "shaking all over" that resolved spontaneously.  He has not sought any evaluation and I am concerned that these could potentially be seizures.  I am going to order an MRI brain to rule out metastatic disease or other etiology contributing to the symptoms.  If this is negative, I will make a referral to Dr. Mickeal Skinner for further neuro evaluation.     Nicholos Johns, PA-C

## 2018-08-24 NOTE — Telephone Encounter (Signed)
Spoke with Evan [name] about his upcoming brain MRI on Tuesday 7/28, arrive at 7:30 pm for an 8:00 scan at [facility].  Mr. Reggio was thankful for the call and plans to make the appointment.   Mont Dutton R.T.(R)(T) Special Procedures Navigator

## 2018-08-25 NOTE — Telephone Encounter (Signed)
See refill request.

## 2018-08-29 ENCOUNTER — Ambulatory Visit (HOSPITAL_COMMUNITY)
Admission: RE | Admit: 2018-08-29 | Discharge: 2018-08-29 | Disposition: A | Discharge status: Home / Self Care | Payer: Medicare PPO | Source: Ambulatory Visit | Attending: Urology | Admitting: Urology

## 2018-08-29 ENCOUNTER — Other Ambulatory Visit: Payer: Self-pay

## 2018-08-29 DIAGNOSIS — R251 Tremor, unspecified: Secondary | ICD-10-CM | POA: Diagnosis present

## 2018-08-29 DIAGNOSIS — C342 Malignant neoplasm of middle lobe, bronchus or lung: Secondary | ICD-10-CM | POA: Diagnosis not present

## 2018-08-29 DIAGNOSIS — C3412 Malignant neoplasm of upper lobe, left bronchus or lung: Secondary | ICD-10-CM | POA: Insufficient documentation

## 2018-08-29 LAB — POCT I-STAT CREATININE: Creatinine, Ser: 1 mg/dL (ref 0.61–1.24)

## 2018-08-29 MED ORDER — GADOBUTROL 1 MMOL/ML IV SOLN
6.0000 mL | Freq: Once | INTRAVENOUS | Status: AC | PRN
  Administered 2018-08-29: 20:00:00 6 mL via INTRAVENOUS

## 2018-08-29 NOTE — Progress Notes (Signed)
  Radiation Oncology         [phone] ________________________________  Name: Evan [name] MRN: [id number]  Date: 07/24/2018  DOB: Apr 02, 1950  End of Treatment Note  Diagnosis:   68 yo gentlemen with synchronous bilateral stage I non-small cell lung cancer     Indication for treatment:  Curative, Definitive SBRT       Radiation treatment dates:   07/17/18, 07/19/18 and 07/24/18  Site/dose:   The target was treated to 54 Gy in 3 fractions of 18 Gy  Beams/energy:   The patient was treated using stereotactic body radiotherapy according to a 3D conformal radiotherapy plan.  Volumetric arc fields were employed to deliver 6 MV X-rays.  Image guidance was performed with per fraction cone beam CT prior to treatment under personal MD supervision.  Immobilization was achieved using BodyFix Pillow.  Narrative: The patient tolerated radiation treatment relatively well.   No complications noted  Plan: The patient has completed radiation treatment. The patient will return to radiation oncology clinic for routine followup in one month. I advised them to call or return sooner if they have any questions or concerns related to their recovery or treatment. ________________________________  [name]. [name], M.D.

## 2018-08-30 ENCOUNTER — Inpatient Hospital Stay (HOSPITAL_BASED_OUTPATIENT_CLINIC_OR_DEPARTMENT_OTHER): Payer: Medicare PPO | Admitting: Internal Medicine

## 2018-08-30 ENCOUNTER — Inpatient Hospital Stay: Payer: Medicare PPO | Attending: Radiation Oncology

## 2018-08-30 DIAGNOSIS — R259 Unspecified abnormal involuntary movements: Secondary | ICD-10-CM

## 2018-08-30 NOTE — Progress Notes (Signed)
I connected with Evan [name] Care on 08/30/18 at 10:30 AM EDT by telephone visit and verified that I am speaking with the correct person using two identifiers.  I discussed the limitations, risks, security and privacy concerns of performing an evaluation and management service by telemedicine and the availability of in-person appointments. I also discussed with the patient that there may be a patient responsible charge related to this service. The patient expressed understanding and agreed to proceed.  Other persons participating in the visit and their role in the encounter:  n/a  Patient's location:  Home  Provider's location:  Office  Chief Complaint:  Trembling episodes  History of Present Ilness: Evan [name] describes episodes of "both hands trembling" accompanied by "lightheaded feeling" which resolves after 1-2 minutes.  Frequency is sporadic, onset was 2 months ago.  He feels episodes may be triggered by dehydration or following periods out in the heat.  No change in awareness or loss of consciousness during events or following.  He has full memory and is confident that both hands are involved in the shaking.  Otherwise no stroke-like symptoms or seizures.  MRI brain was ordered and available for review.  Observations: Language and cognition normal  Imaging:  [facility] Clinician Interpretation: I have personally reviewed the CNS images as listed (MRI brain 08/30/18).  My interpretation, in the context of the patient's clinical presentation, is normal enhanced brain MRI, pending official read  No results found.  Assessment and Plan:   I do not suspect these events are epileptic in etiology, based on semiology and aspects of patient history.  They are more consistent with tremor, myoclonus or asterixis, which may be related to transient periods of poor CNS perfusion due to severe bilateral carotid stenosis at the vascular origin approximating the aorta.  Of note, his vascular disease is secondary  thought to be secondary to radiation exposure rather than traditional atheromatous disease.  He does feel improved symptoms with hydration or adopting prone/suping position, which is more consistent with vascular/flow phenomena.  Brain MRI is normal without any evidence of structural or parenchymal vascular disease.  Follow Up Instructions:  Counseled him to be very cautious about hydration status.  If episodes increase in frequency or worsen in nature, he will call us for in-person evaluation.    I discussed the assessment and treatment plan with the patient.  The patient was provided an opportunity to ask questions and all were answered.  The patient agreed with the plan and demonstrated understanding of the instructions.    The patient was advised to call back or seek an in-person evaluation if the symptoms worsen or if the condition fails to improve as anticipated.  I provided 20 minutes of non-face-to-face time during this enocunter.  Ventura Sellers, MD   I provided 20 minutes of non face-to-face telephone visit time during this encounter, and > 50% was spent counseling as documented under my assessment & plan.

## 2018-08-31 ENCOUNTER — Telehealth: Payer: Self-pay | Admitting: Internal Medicine

## 2018-08-31 ENCOUNTER — Ambulatory Visit: Payer: Medicare PPO | Admitting: Internal Medicine

## 2018-08-31 NOTE — Telephone Encounter (Signed)
No los per 7/29.

## 2018-09-26 ENCOUNTER — Inpatient Hospital Stay: Payer: Medicare PPO | Attending: Radiation Oncology

## 2018-09-26 ENCOUNTER — Ambulatory Visit (HOSPITAL_COMMUNITY)
Admission: RE | Admit: 2018-09-26 | Discharge: 2018-09-26 | Disposition: A | Discharge status: Home / Self Care | Payer: Medicare PPO | Source: Ambulatory Visit | Attending: Internal Medicine | Admitting: Internal Medicine

## 2018-09-26 ENCOUNTER — Encounter (HOSPITAL_COMMUNITY): Payer: Self-pay

## 2018-09-26 ENCOUNTER — Other Ambulatory Visit: Payer: Self-pay

## 2018-09-26 DIAGNOSIS — R0609 Other forms of dyspnea: Secondary | ICD-10-CM | POA: Diagnosis not present

## 2018-09-26 DIAGNOSIS — Z85118 Personal history of other malignant neoplasm of bronchus and lung: Secondary | ICD-10-CM | POA: Diagnosis not present

## 2018-09-26 DIAGNOSIS — C3412 Malignant neoplasm of upper lobe, left bronchus or lung: Secondary | ICD-10-CM | POA: Diagnosis present

## 2018-09-26 DIAGNOSIS — I10 Essential (primary) hypertension: Secondary | ICD-10-CM | POA: Insufficient documentation

## 2018-09-26 DIAGNOSIS — C3490 Malignant neoplasm of unspecified part of unspecified bronchus or lung: Secondary | ICD-10-CM | POA: Insufficient documentation

## 2018-09-26 DIAGNOSIS — C3411 Malignant neoplasm of upper lobe, right bronchus or lung: Secondary | ICD-10-CM

## 2018-09-26 LAB — CMP (CANCER CENTER ONLY)
ALT: 16 U/L (ref 0–44)
AST: 17 U/L (ref 15–41)
Albumin: 3.7 g/dL (ref 3.5–5.0)
Alkaline Phosphatase: 95 U/L (ref 38–126)
Anion gap: 8 (ref 5–15)
BUN: 14 mg/dL (ref 8–23)
CO2: 24 mmol/L (ref 22–32)
Calcium: 8.7 mg/dL — ABNORMAL LOW (ref 8.9–10.3)
Chloride: 106 mmol/L (ref 98–111)
Creatinine: 1.01 mg/dL (ref 0.61–1.24)
GFR, Est AFR Am: >60 mL/min (ref >60)
GFR, Estimated: >60 mL/min (ref >60)
Glucose, Bld: 91 mg/dL (ref 70–99)
Potassium: 4.2 mmol/L (ref 3.5–5.1)
Sodium: 138 mmol/L (ref 135–145)
Total Bilirubin: 0.3 mg/dL (ref 0.3–1.2)
Total Protein: 7.2 g/dL (ref 6.5–8.1)

## 2018-09-26 LAB — CBC WITH DIFFERENTIAL (CANCER CENTER ONLY)
Abs Immature Granulocytes: 0 10*3/uL (ref 0.00–0.07)
Basophils Absolute: 0.1 10*3/uL (ref 0.0–0.1)
Basophils Relative: 1 %
Eosinophils Absolute: 1.1 10*3/uL — ABNORMAL HIGH (ref 0.0–0.5)
Eosinophils Relative: 18 %
HCT: 42.9 % (ref 39.0–52.0)
Hemoglobin: 13.8 g/dL (ref 13.0–17.0)
Immature Granulocytes: 0 %
Lymphocytes Relative: 19 %
Lymphs Abs: 1.2 10*3/uL (ref 0.7–4.0)
MCH: 28.2 pg (ref 26.0–34.0)
MCHC: 32.2 g/dL (ref 30.0–36.0)
MCV: 87.7 fL (ref 80.0–100.0)
Monocytes Absolute: 0.5 10*3/uL (ref 0.1–1.0)
Monocytes Relative: 8 %
Neutro Abs: 3.2 10*3/uL (ref 1.7–7.7)
Neutrophils Relative %: 54 %
Platelet Count: 284 10*3/uL (ref 150–400)
RBC: 4.89 MIL/uL (ref 4.22–5.81)
RDW: 13.7 % (ref 11.5–15.5)
WBC Count: 6 10*3/uL (ref 4.0–10.5)
nRBC: 0 % (ref 0.0–0.2)

## 2018-09-26 MED ORDER — SODIUM CHLORIDE (PF) 0.9 % IJ SOLN
INTRAMUSCULAR | Status: AC
  Filled 2018-09-26: qty 50

## 2018-09-26 MED ORDER — IOHEXOL 300 MG/ML  SOLN
75.0000 mL | Freq: Once | INTRAMUSCULAR | Status: AC | PRN
  Administered 2018-09-26: 14:00:00 75 mL via INTRAVENOUS

## 2018-09-27 ENCOUNTER — Other Ambulatory Visit: Payer: Self-pay

## 2018-09-27 ENCOUNTER — Inpatient Hospital Stay (HOSPITAL_BASED_OUTPATIENT_CLINIC_OR_DEPARTMENT_OTHER): Payer: Medicare PPO | Admitting: Internal Medicine

## 2018-09-27 ENCOUNTER — Encounter: Payer: Self-pay | Admitting: Internal Medicine

## 2018-09-27 VITALS — BP 131/87 | HR 91 | Temp 98.3°F | Resp 16 | Ht 65.0 in | Wt 134.3 lb

## 2018-09-27 DIAGNOSIS — I10 Essential (primary) hypertension: Secondary | ICD-10-CM

## 2018-09-27 DIAGNOSIS — C3490 Malignant neoplasm of unspecified part of unspecified bronchus or lung: Secondary | ICD-10-CM | POA: Diagnosis not present

## 2018-09-27 DIAGNOSIS — C3412 Malignant neoplasm of upper lobe, left bronchus or lung: Secondary | ICD-10-CM | POA: Diagnosis not present

## 2018-09-27 DIAGNOSIS — C3411 Malignant neoplasm of upper lobe, right bronchus or lung: Secondary | ICD-10-CM | POA: Diagnosis not present

## 2018-09-27 DIAGNOSIS — C342 Malignant neoplasm of middle lobe, bronchus or lung: Secondary | ICD-10-CM

## 2018-09-27 NOTE — Progress Notes (Signed)
[facility] [phone]   Fax:([phone]) [phone]  OFFICE PROGRESS NOTE  Default, Provider, MD No address on file  PRINCIPAL DIAGNOSIS: 1) stage IIIA (T2a, N2, M0) non-small cell lung cancer, squamous cell carcinoma of the left upper lobe diagnosed in August 2016 2)  Stage IIIA (T2 N2 Mx) non-small cell lung cancer, adenocarcinoma, involving the right upper lobe and mediastinal lymphadenopathy diagnosed in June 2010.   PRIOR THERAPY:  1. Status post concurrent chemoradiation with weekly carboplatin and paclitaxel; last dose was given April 08, 2008. 2. Status post 3 cycles of consolidation chemotherapy with docetaxel; last dose was given on Jun 26, 2008. 3. Concurrent chemoradiation with weekly carboplatin for AUC of 2 and paclitaxel 45 MG/M2. First dose expected on 09/23/2014 status post 7 cycles. Last dose was given 12/01/2014 with partial response. 4. Consolidation chemotherapy with carboplatin for AUC of 5 and paclitaxel 175 MG/M2 every 3 weeks with Neulasta support. First dose 01/02/2015. Status post 3 cycles.   5. Curative SBRT to right middle lobe lung nodule under the care of Dr. [name] completed on 07/24/2018   CURRENT THERAPY: Observation.  INTERVAL HISTORY: Evan [name] 68 y.o. male returns to the clinic today for follow-up visit.  The patient is feeling fine today with no concerning complaints except for occasional shortness of breath with exertion.  He denied having any chest pain, cough or hemoptysis.  He denied having any fever or chills.  He has no nausea, vomiting, diarrhea or constipation.  He denied having any headache or visual changes.  He had repeat CT scan of the chest performed recently and is here for evaluation and discussion of his scan results.  MEDICAL HISTORY: Past Medical History:  Diagnosis Date  . Anxiety   . Complication of anesthesia    " sometimes I wake up during surgery "  . Depression   . Left renal mass 07/19/2016   . Lung cancer ([place]) dx'd 2010    NSCL CA right  . Lung cancer ([place]) dx'd 2017   left    ALLERGIES:  has No Known Allergies.  MEDICATIONS:  Current Outpatient Medications  Medication Sig Dispense Refill  . acetaminophen (TYLENOL) 500 MG tablet Take by mouth.    [name] albuterol (PROVENTIL HFA;VENTOLIN HFA) 108 (90 BASE) MCG/ACT inhaler Inhale 2 puffs into the lungs every 6 (six) hours as needed for wheezing or shortness of breath. (Patient not taking: Reported on 05/29/2018) 1 Inhaler 2  . [employer] ASPIRIN EC 325 MG EC tablet TAKE 1 TABLET BY MOUTH EVERY DAY 90 tablet 1  . ibuprofen (ADVIL,MOTRIN) 600 MG tablet Take 600 mg by mouth every 6 (six) hours as needed.    . Multiple Vitamin (MULTIVITAMIN WITH MINERALS) TABS tablet Take 1 tablet by mouth daily.    . Nutritional Supplements (CARNATION BREAKFAST ESSENTIALS PO) Take 1 packet by mouth daily.     . Tiotropium Bromide-Olodaterol (STIOLTO RESPIMAT) 2.5-2.5 MCG/ACT AERS Inhale 2 puffs into the lungs daily. (Patient not taking: Reported on 02/27/2018) 1 Inhaler 5   No current facility-administered medications for this visit.     SURGICAL HISTORY:  Past Surgical History:  Procedure Laterality Date  . AMPUTATION Right 05/21/2012   Procedure: AMPUTATION BELOW KNEE;  Surgeon: Newt Minion, MD;  Location: [place];  Service: Orthopedics;  Laterality: Right;  . AMPUTATION Right 06/21/2012   Procedure: Revision AMPUTATION BELOW KNEE Right;  Surgeon: Newt Minion, MD;  Location: [facility];  Service: Orthopedics;  Laterality:  Right;  Revision right Below Knee Amputation  . COLONOSCOPY W/ BIOPSIES AND POLYPECTOMY    . FOOT AMPUTATION Right    traumatic right lower extremity  . UPPER JAW     SURGERY FOR INFECTION    REVIEW OF SYSTEMS:  A comprehensive review of systems was negative except for: Respiratory: positive for dyspnea on exertion   PHYSICAL EXAMINATION: General appearance: alert, cooperative and no distress Head: Normocephalic, without obvious  abnormality, atraumatic Neck: no adenopathy Lymph nodes: Cervical, supraclavicular, and axillary nodes normal. Resp: clear to auscultation bilaterally Back: symmetric, no curvature. ROM normal. No CVA tenderness. Cardio: regular rate and rhythm, S1, S2 normal, no murmur, click, rub or gallop GI: soft, non-tender; bowel sounds normal; no masses,  no organomegaly Extremities: extremities normal, atraumatic, no cyanosis or edema  ECOG PERFORMANCE STATUS: 1 - Symptomatic but completely ambulatory  Blood pressure 131/87, pulse 91, temperature 98.3 F (36.8 C), resp. rate 16, height 5\' 5"  (1.651 m), weight 134 lb 4.8 oz (60.9 kg), SpO2 98 %.  LABORATORY DATA: Lab Results  Component Value Date   WBC 6.0 09/26/2018   HGB 13.8 09/26/2018   HCT 42.9 09/26/2018   MCV 87.7 09/26/2018   PLT 284 09/26/2018      Chemistry      Component Value Date/Time   NA 138 09/26/2018 1036   NA 141 07/16/2016 1220   K 4.2 09/26/2018 1036   K 4.0 07/16/2016 1220   CL 106 09/26/2018 1036   CL 104 07/19/2012 1432   CO2 24 09/26/2018 1036   CO2 24 07/16/2016 1220   BUN 14 09/26/2018 1036   BUN 8.8 07/16/2016 1220   CREATININE 1.01 09/26/2018 1036   CREATININE 1.0 07/16/2016 1220      Component Value Date/Time   CALCIUM 8.7 (L) 09/26/2018 1036   CALCIUM 9.3 07/16/2016 1220   ALKPHOS 95 09/26/2018 1036   ALKPHOS 92 07/16/2016 1220   AST 17 09/26/2018 1036   AST 18 07/16/2016 1220   ALT 16 09/26/2018 1036   ALT 14 07/16/2016 1220   BILITOT 0.3 09/26/2018 1036   BILITOT 0.39 07/16/2016 1220       RADIOGRAPHIC STUDIES: Ct Chest W Contrast  Result Date: 09/26/2018 CLINICAL DATA:  Right-sided lung cancer diagnosed in 2010. Left-sided lung cancer diagnosed in 2017. Asymptomatic. EXAM: CT CHEST WITH CONTRAST TECHNIQUE: Multidetector CT imaging of the chest was performed during intravenous contrast administration. CONTRAST:  94mL OMNIPAQUE IOHEXOL 300 MG/ML  SOLN COMPARISON:  05/26/2018 FINDINGS:  Cardiovascular: Aortic and branch vessel atherosclerosis. Normal heart size, without pericardial effusion. No central pulmonary embolism, on this non-dedicated study. Mediastinum/Nodes: No supraclavicular adenopathy. No mediastinal or hilar adenopathy. Right carotid artery narrowing again identified, including on 20/2. Lungs/Pleura: Moderate left hemidiaphragm elevation. Centrilobular emphysema. No pleural fluid. Spiculated posterior right middle lobe pulmonary nodule measures 10 x 7 mm on 72/7. Similar to on the prior exam. Subpleural right lower lobe pulmonary nodule measures 5 mm on 106/7 and is not significantly changed. Similar configuration of right paramediastinal consolidation and traction bronchiectasis, presumably radiation induced. Unchanged presumably radiation induced consolidation in the medial left upper lobe. The left apical pulmonary nodularity decreased centrally, including on 30/7. The more anterior component is also slightly decreased, including at 6 mm on 36/7 versus maximally 11 mm on the prior exam (when remeasured). Upper Abdomen: Normal imaged portions of the liver, spleen, stomach, pancreas, adrenal glands, kidneys. Musculoskeletal: Sternal manubrial presumed pathologic fracture with underlying mild lucency, similar on of 41/2. IMPRESSION: 1.  Similar radiation induced consolidation within the medial upper lungs bilaterally. 2. Similar appearance of right middle and right lower lobe pulmonary nodules, for which metachronous primaries or metastatic disease cannot be excluded. 3. The left apical pulmonary nodularity has improved. 4. Aortic atherosclerosis (ICD10-I70.0) and emphysema (ICD10-J43.9). 5. Similar sternal manubrial fracture, likely due to radiation induced necrosis. Electronically Signed   By: Abigail Miyamoto M.D.   On: 09/26/2018 17:09   Mr [name] RN Contrast  Result Date: 08/30/2018 CLINICAL DATA:  Non-small-cell lung cancer staging EXAM: MRI HEAD WITHOUT AND WITH CONTRAST  TECHNIQUE: Multiplanar, multiecho pulse sequences of the brain and surrounding structures were obtained without and with intravenous contrast. CONTRAST:  6 mL Gadovist IV COMPARISON:  MRI head 08/16/2014 FINDINGS: Brain: No acute infarction, hemorrhage, hydrocephalus, extra-axial collection or mass lesion. Normal enhancement. No enhancing metastatic deposits identified. No edema or midline shift. Vascular: Normal arterial flow voids. Skull and upper cervical spine: Negative Sinuses/Orbits: Mild mucosal edema paranasal sinuses.  Normal orbit Other: None IMPRESSION: Negative for metastatic disease.  No acute intracranial abnormality. Electronically Signed   By: Franchot Gallo M.D.   On: 08/30/2018 15:00    ASSESSMENT AND PLAN:  This is a very pleasant 68 years old African-American male with history of 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 The patient has disease recurrence on the left side presented as a stage IIIa non-small cell lung cancer, squamous cell carcinoma status post concurrent chemoradiation followed by consolidation chemotherapy. Completed in March 2017. He also underwent SBRT to the right middle lobe lung nodule under the care of Dr. [name] completed on 07/24/2018. The patient is currently on observation. He had repeat CT scan of the chest performed recently.  I personally and independently reviewed the scans and discussed the results with the patient.  His scan showed no concerning findings for disease progression. I recommended for the patient to continue on observation with repeat CT scan of the chest in 6 months. For hypertension, the patient will continue his current blood pressure medication. He was advised to call immediately if he has any concerning symptoms in the interval. All questions were answered. The patient knows to call the clinic with any problems,  questions or concerns. We can certainly see the patient much sooner if necessary.   Disclaimer: This note was dictated with voice recognition software. Similar sounding words can inadvertently be transcribed and may not be corrected upon review.

## 2018-09-28 ENCOUNTER — Telehealth: Payer: Self-pay | Admitting: Internal Medicine

## 2018-09-28 NOTE — Telephone Encounter (Signed)
Scheduled appt per 8/26 los - mailed letter with appt date and time

## 2019-02-21 ENCOUNTER — Other Ambulatory Visit: Payer: Self-pay | Admitting: Internal Medicine

## 2019-02-21 NOTE — Telephone Encounter (Signed)
Efill request

## 2019-02-26 ENCOUNTER — Telehealth: Payer: Self-pay | Admitting: Medical Oncology

## 2019-02-26 NOTE — Telephone Encounter (Signed)
Yes we can see him sooner with repeat his scan.  Thank you.

## 2019-02-26 NOTE — Telephone Encounter (Signed)
Symptomatic- " I think the tumor in my chest is growing because I 'm losing weight, more tired and more short of breath". He does not sound to be in acute distress. He is coughing on the phone  during conversation .  He is asking for a rescue inhaler. I directed him to contact Dr Halford Chessman for a refill on his inhaler. Scan expected 2/26.

## 2019-02-27 NOTE — Telephone Encounter (Signed)
Pt notified and to expect scheduling calls.Scheduling message sent.

## 2019-03-01 ENCOUNTER — Telehealth: Payer: Self-pay | Admitting: Internal Medicine

## 2019-03-01 NOTE — Telephone Encounter (Signed)
I talk with patient regarding lab 2/2

## 2019-03-06 ENCOUNTER — Ambulatory Visit (HOSPITAL_COMMUNITY)
Admission: RE | Admit: 2019-03-06 | Discharge: 2019-03-06 | Disposition: A | Discharge status: Home / Self Care | Payer: Medicare PPO | Source: Ambulatory Visit | Attending: Internal Medicine | Admitting: Internal Medicine

## 2019-03-06 ENCOUNTER — Other Ambulatory Visit: Payer: Self-pay

## 2019-03-06 ENCOUNTER — Inpatient Hospital Stay: Payer: Medicare PPO | Attending: Internal Medicine

## 2019-03-06 ENCOUNTER — Encounter (HOSPITAL_COMMUNITY): Payer: Self-pay

## 2019-03-06 DIAGNOSIS — R079 Chest pain, unspecified: Secondary | ICD-10-CM | POA: Insufficient documentation

## 2019-03-06 DIAGNOSIS — C3490 Malignant neoplasm of unspecified part of unspecified bronchus or lung: Secondary | ICD-10-CM

## 2019-03-06 DIAGNOSIS — C3412 Malignant neoplasm of upper lobe, left bronchus or lung: Secondary | ICD-10-CM | POA: Insufficient documentation

## 2019-03-06 DIAGNOSIS — R05 Cough: Secondary | ICD-10-CM | POA: Diagnosis not present

## 2019-03-06 DIAGNOSIS — Z85118 Personal history of other malignant neoplasm of bronchus and lung: Secondary | ICD-10-CM | POA: Insufficient documentation

## 2019-03-06 DIAGNOSIS — R911 Solitary pulmonary nodule: Secondary | ICD-10-CM | POA: Diagnosis not present

## 2019-03-06 LAB — CMP (CANCER CENTER ONLY)
ALT: 13 U/L (ref 0–44)
AST: 16 U/L (ref 15–41)
Albumin: 3.5 g/dL (ref 3.5–5.0)
Alkaline Phosphatase: 98 U/L (ref 38–126)
Anion gap: 9 (ref 5–15)
BUN: 16 mg/dL (ref 8–23)
CO2: 22 mmol/L (ref 22–32)
Calcium: 8.7 mg/dL — ABNORMAL LOW (ref 8.9–10.3)
Chloride: 107 mmol/L (ref 98–111)
Creatinine: 0.93 mg/dL (ref 0.61–1.24)
GFR, Est AFR Am: >60 mL/min (ref >60)
GFR, Estimated: >60 mL/min (ref >60)
Glucose, Bld: 92 mg/dL (ref 70–99)
Potassium: 3.8 mmol/L (ref 3.5–5.1)
Sodium: 138 mmol/L (ref 135–145)
Total Bilirubin: <0.2 mg/dL — ABNORMAL LOW (ref 0.3–1.2)
Total Protein: 7.5 g/dL (ref 6.5–8.1)

## 2019-03-06 LAB — CBC WITH DIFFERENTIAL (CANCER CENTER ONLY)
Abs Immature Granulocytes: 0.01 10*3/uL (ref 0.00–0.07)
Basophils Absolute: 0.1 10*3/uL (ref 0.0–0.1)
Basophils Relative: 1 %
Eosinophils Absolute: 0.6 10*3/uL — ABNORMAL HIGH (ref 0.0–0.5)
Eosinophils Relative: 10 %
HCT: 43.1 % (ref 39.0–52.0)
Hemoglobin: 13.9 g/dL (ref 13.0–17.0)
Immature Granulocytes: 0 %
Lymphocytes Relative: 21 %
Lymphs Abs: 1.2 10*3/uL (ref 0.7–4.0)
MCH: 28.3 pg (ref 26.0–34.0)
MCHC: 32.3 g/dL (ref 30.0–36.0)
MCV: 87.8 fL (ref 80.0–100.0)
Monocytes Absolute: 0.5 10*3/uL (ref 0.1–1.0)
Monocytes Relative: 8 %
Neutro Abs: 3.4 10*3/uL (ref 1.7–7.7)
Neutrophils Relative %: 60 %
Platelet Count: 379 10*3/uL (ref 150–400)
RBC: 4.91 MIL/uL (ref 4.22–5.81)
RDW: 13.9 % (ref 11.5–15.5)
WBC Count: 5.7 10*3/uL (ref 4.0–10.5)
nRBC: 0 % (ref 0.0–0.2)

## 2019-03-06 MED ORDER — IOHEXOL 300 MG/ML  SOLN
75.0000 mL | Freq: Once | INTRAMUSCULAR | Status: AC | PRN
  Administered 2019-03-06: 75 mL via INTRAVENOUS

## 2019-03-06 MED ORDER — SODIUM CHLORIDE (PF) 0.9 % IJ SOLN
INTRAMUSCULAR | Status: AC
  Filled 2019-03-06: qty 50

## 2019-03-14 ENCOUNTER — Telehealth: Payer: Self-pay | Admitting: Medical Oncology

## 2019-03-14 NOTE — Telephone Encounter (Signed)
Asking about  CT results. Next appt 3/1

## 2019-03-20 ENCOUNTER — Inpatient Hospital Stay (HOSPITAL_BASED_OUTPATIENT_CLINIC_OR_DEPARTMENT_OTHER): Payer: Medicare PPO | Admitting: Internal Medicine

## 2019-03-20 ENCOUNTER — Encounter: Payer: Self-pay | Admitting: Internal Medicine

## 2019-03-20 ENCOUNTER — Other Ambulatory Visit: Payer: Self-pay

## 2019-03-20 VITALS — BP 116/88 | HR 96 | Temp 98.0°F | Resp 17 | Ht 65.0 in | Wt 133.9 lb

## 2019-03-20 DIAGNOSIS — C3412 Malignant neoplasm of upper lobe, left bronchus or lung: Secondary | ICD-10-CM | POA: Diagnosis not present

## 2019-03-20 DIAGNOSIS — C3411 Malignant neoplasm of upper lobe, right bronchus or lung: Secondary | ICD-10-CM

## 2019-03-20 DIAGNOSIS — C3490 Malignant neoplasm of unspecified part of unspecified bronchus or lung: Secondary | ICD-10-CM

## 2019-03-20 DIAGNOSIS — I10 Essential (primary) hypertension: Secondary | ICD-10-CM | POA: Diagnosis not present

## 2019-03-20 NOTE — Progress Notes (Signed)
[place] [phone]   Fax:([phone]) [phone]  OFFICE PROGRESS NOTE  Default, Provider, MD No address on file  PRINCIPAL DIAGNOSIS: 1) stage IIIA (T2a, N2, M0) non-small cell lung cancer, squamous cell carcinoma of the left upper lobe diagnosed in August 2016 2)  Stage IIIA (T2 N2 Mx) non-small cell lung cancer, adenocarcinoma, involving the right upper lobe and mediastinal lymphadenopathy diagnosed in June 2010.   PRIOR THERAPY:  1. Status post concurrent chemoradiation with weekly carboplatin and paclitaxel; last dose was given April 08, 2008. 2. Status post 3 cycles of consolidation chemotherapy with docetaxel; last dose was given on Jun 26, 2008. 3. Concurrent chemoradiation with weekly carboplatin for AUC of 2 and paclitaxel 45 MG/M2. First dose expected on 09/23/2014 status post 7 cycles. Last dose was given 12/01/2014 with partial response. 4. Consolidation chemotherapy with carboplatin for AUC of 5 and paclitaxel 175 MG/M2 every 3 weeks with Neulasta support. First dose 01/02/2015. Status post 3 cycles.   5. Curative SBRT to right middle lobe lung nodule under the care of Dr. [name] completed on 07/24/2018   CURRENT THERAPY: Observation.  INTERVAL HISTORY: Evan [name] 69 y.o. male returns to the clinic today for follow-up visit.  The patient continues to have intermittent pain on the left side of the chest close to the lower rib cage.  He denied having any current shortness of breath.  He has mild cough with no hemoptysis.  He denied having any recent weight loss or night sweats.  He has no nausea, vomiting, diarrhea or constipation.  He has no headache or visual changes.  He had repeat CT scan of the chest performed recently and is here for evaluation and discussion of his scan results.   MEDICAL HISTORY: Past Medical History:  Diagnosis Date  . Anxiety   . Complication of anesthesia    " sometimes I wake up during surgery "  . Depression     . Left renal mass 07/19/2016  . Lung cancer ([place]) dx'd 2010    NSCL CA right  . Lung cancer ([place]) dx'd 2017   left    ALLERGIES:  has No Known Allergies.  MEDICATIONS:  Current Outpatient Medications  Medication Sig Dispense Refill  . acetaminophen (TYLENOL) 500 MG tablet Take by mouth.    [name] albuterol (PROVENTIL HFA;VENTOLIN HFA) 108 (90 BASE) MCG/ACT inhaler Inhale 2 puffs into the lungs every 6 (six) hours as needed for wheezing or shortness of breath. 1 Inhaler 2  . [employer] ASPIRIN EC 325 MG EC tablet TAKE 1 TABLET BY MOUTH EVERY DAY 90 tablet 1  . ibuprofen (ADVIL,MOTRIN) 600 MG tablet Take 600 mg by mouth every 6 (six) hours as needed.    . Multiple Vitamin (MULTIVITAMIN WITH MINERALS) TABS tablet Take 1 tablet by mouth daily.    . Nutritional Supplements (CARNATION BREAKFAST ESSENTIALS PO) Take 1 packet by mouth daily.     . Tiotropium Bromide-Olodaterol (STIOLTO RESPIMAT) 2.5-2.5 MCG/ACT AERS Inhale 2 puffs into the lungs daily. 1 Inhaler 5   No current facility-administered medications for this visit.    SURGICAL HISTORY:  Past Surgical History:  Procedure Laterality Date  . AMPUTATION Right 05/21/2012   Procedure: AMPUTATION BELOW KNEE;  Surgeon: Newt Minion, MD;  Location: [facility];  Service: Orthopedics;  Laterality: Right;  . AMPUTATION Right 06/21/2012   Procedure: Revision AMPUTATION BELOW KNEE Right;  Surgeon: Newt Minion, MD;  Location: [facility];  Service: Orthopedics;  Laterality:  Right;  Revision right Below Knee Amputation  . COLONOSCOPY W/ BIOPSIES AND POLYPECTOMY    . FOOT AMPUTATION Right    traumatic right lower extremity  . UPPER JAW     SURGERY FOR INFECTION    REVIEW OF SYSTEMS:  A comprehensive review of systems was negative except for: Respiratory: positive for dyspnea on exertion and pleurisy/chest pain   PHYSICAL EXAMINATION: General appearance: alert, cooperative and no distress Head: Normocephalic, without obvious abnormality, atraumatic Neck: no  adenopathy Lymph nodes: Cervical, supraclavicular, and axillary nodes normal. Resp: clear to auscultation bilaterally Back: symmetric, no curvature. ROM normal. No CVA tenderness. Cardio: regular rate and rhythm, S1, S2 normal, no murmur, click, rub or gallop GI: soft, non-tender; bowel sounds normal; no masses,  no organomegaly Extremities: extremities normal, atraumatic, no cyanosis or edema  ECOG PERFORMANCE STATUS: 1 - Symptomatic but completely ambulatory  Blood pressure 116/88, pulse 96, temperature 98 F (36.7 C), temperature source Temporal, resp. rate 17, height 5\' 5"  (1.651 m), weight 133 lb 14.4 oz (60.7 kg), SpO2 97 %.  LABORATORY DATA: Lab Results  Component Value Date   WBC 5.7 03/06/2019   HGB 13.9 03/06/2019   HCT 43.1 03/06/2019   MCV 87.8 03/06/2019   PLT 379 03/06/2019      Chemistry      Component Value Date/Time   NA 138 03/06/2019 1350   NA 141 07/16/2016 1220   K 3.8 03/06/2019 1350   K 4.0 07/16/2016 1220   CL 107 03/06/2019 1350   CL 104 07/19/2012 1432   CO2 22 03/06/2019 1350   CO2 24 07/16/2016 1220   BUN 16 03/06/2019 1350   BUN 8.8 07/16/2016 1220   CREATININE 0.93 03/06/2019 1350   CREATININE 1.0 07/16/2016 1220      Component Value Date/Time   CALCIUM 8.7 (L) 03/06/2019 1350   CALCIUM 9.3 07/16/2016 1220   ALKPHOS 98 03/06/2019 1350   ALKPHOS 92 07/16/2016 1220   AST 16 03/06/2019 1350   AST 18 07/16/2016 1220   ALT 13 03/06/2019 1350   ALT 14 07/16/2016 1220   BILITOT <0.2 (L) 03/06/2019 1350   BILITOT 0.39 07/16/2016 1220       RADIOGRAPHIC STUDIES: CT Chest W Contrast  Result Date: 03/06/2019 CLINICAL DATA:  Non-small cell lung cancer staging EXAM: CT CHEST WITH CONTRAST TECHNIQUE: Multidetector CT imaging of the chest was performed during intravenous contrast administration. CONTRAST:  44mL OMNIPAQUE IOHEXOL 300 MG/ML  SOLN COMPARISON:  09/26/2018, PET-CT, 06/29/2018 FINDINGS: Cardiovascular: Aortic atherosclerosis. Normal  heart size. No pericardial effusion. Mediastinum/Nodes: No discretely enlarged mediastinal lymph nodes. Unchanged post treatment appearance of bilateral hilar soft tissue. Interval increase in size and fullness of paramedian left upper lobe mass/prevascular anterior mediastinal soft tissue, measuring approximately 3.7 x 2.8 cm in greatest axial extent, previously 3.2 x 2.1 cm when measured similarly (series 2, image 45). Thyroid gland, trachea, and esophagus demonstrate no significant findings. Lungs/Pleura: No significant change in dense paramedian post treatment fibrotic consolidation of the perihilar lungs and bilateral upper lobes (series 7, image 46). There is a new 6 mm spiculated nodule of the superior segment left lower lobe (series 7, image 42). Unchanged spiculated nodule of the lateral segment right middle lobe measuring 1.0 x 0.6 cm (series 7, image 67). Unchanged 5-6 mm subpleural nodule of the medial right lower lobe (series 7, image 99). Emphysema. No pleural effusion or pneumothorax. Upper Abdomen: No acute abnormality. Musculoskeletal: No chest wall mass. Unchanged sclerotic appearance of the right  aspect of the manubrium (series 7, image 38). IMPRESSION: 1. Interval increase in size and fullness of paramedian left upper lobe mass/prevascular anterior mediastinal soft tissue, concerning for local recurrence of disease. FDG PET may be useful to assess for recurrent metabolic activity. 2. New 6 mm spiculated nodule of the superior segment left lower lobe, concerning for metachronous primary malignancy or metastasis. 3. Unchanged spiculated nodule of the lateral segment right middle lobe and subpleural nodule of the medial right lower lobe, likewise concerning for metastatic for metachronous disease. 4. Unchanged sclerotic appearance of the right aspect of the manubrium, which may reflect underlying metastatic or radiation necrosis. 5.  Emphysema (ICD10-J43.9). 6.  Aortic Atherosclerosis (ICD10-I70.0).  Electronically Signed   By: Eddie Candle M.D.   On: 03/06/2019 17:00    ASSESSMENT AND PLAN:  This is a very pleasant 69 years old African-American male with history of 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 The patient has disease recurrence on the left side presented as a stage IIIa non-small cell lung cancer, squamous cell carcinoma status post concurrent chemoradiation followed by consolidation chemotherapy. Completed in March 2017. He also underwent SBRT to the right middle lobe lung nodule under the care of Dr. [name] completed on 07/24/2018. The patient is currently on observation and he is doing fine except for the intermittent pain on the left side of the chest. He had repeat CT scan of the chest performed recently.  I personally and independently reviewed the scans and discussed the results with the patient today. His scan showed evidence for disease progression and the left upper lobe pulmonary mass which could be remodeling of the scarring in that area versus disease recurrence. I recommended for the patient to have a PET scan for further evaluation of this lesion. I will see him back for follow-up visit in 2 weeks for evaluation and discussion of his PET scan results and further recommendation regarding his condition. He was advised to call immediately if he has any concerning symptoms in the interval. All questions were answered. The patient knows to call the clinic with any problems, questions or concerns. We can certainly see the patient much sooner if necessary.   Disclaimer: This note was dictated with voice recognition software. Similar sounding words can inadvertently be transcribed and may not be corrected upon review.

## 2019-03-22 ENCOUNTER — Telehealth: Payer: Self-pay | Admitting: Internal Medicine

## 2019-03-22 NOTE — Telephone Encounter (Signed)
Scheduled per los. Called and spoke with patient. Confirmed appt 

## 2019-03-30 ENCOUNTER — Other Ambulatory Visit: Payer: Self-pay

## 2019-03-30 ENCOUNTER — Other Ambulatory Visit: Payer: Medicare PPO

## 2019-03-30 ENCOUNTER — Encounter (HOSPITAL_COMMUNITY)
Admission: RE | Admit: 2019-03-30 | Discharge: 2019-03-30 | Disposition: A | Discharge status: Home / Self Care | Payer: Medicare PPO | Source: Ambulatory Visit | Attending: Internal Medicine | Admitting: Internal Medicine

## 2019-03-30 DIAGNOSIS — Z79899 Other long term (current) drug therapy: Secondary | ICD-10-CM | POA: Diagnosis not present

## 2019-03-30 DIAGNOSIS — R918 Other nonspecific abnormal finding of lung field: Secondary | ICD-10-CM | POA: Insufficient documentation

## 2019-03-30 DIAGNOSIS — I700 Atherosclerosis of aorta: Secondary | ICD-10-CM | POA: Diagnosis not present

## 2019-03-30 DIAGNOSIS — C3490 Malignant neoplasm of unspecified part of unspecified bronchus or lung: Secondary | ICD-10-CM | POA: Diagnosis present

## 2019-03-30 LAB — GLUCOSE, CAPILLARY: Glucose-Capillary: 104 mg/dL — ABNORMAL HIGH (ref 70–99)

## 2019-03-30 MED ORDER — FLUDEOXYGLUCOSE F - 18 (FDG) INJECTION
6.8000 | Freq: Once | INTRAVENOUS | Status: AC | PRN
  Administered 2019-03-30: 11:00:00 6.8 via INTRAVENOUS

## 2019-04-02 ENCOUNTER — Ambulatory Visit: Payer: Medicare PPO | Admitting: Internal Medicine

## 2019-04-03 ENCOUNTER — Inpatient Hospital Stay: Payer: Medicare PPO | Attending: Internal Medicine | Admitting: Internal Medicine

## 2019-04-03 ENCOUNTER — Encounter: Payer: Self-pay | Admitting: Internal Medicine

## 2019-04-03 ENCOUNTER — Other Ambulatory Visit: Payer: Self-pay

## 2019-04-03 VITALS — BP 125/92 | HR 102 | Temp 98.3°F | Resp 18 | Ht 65.0 in | Wt 132.6 lb

## 2019-04-03 DIAGNOSIS — I10 Essential (primary) hypertension: Secondary | ICD-10-CM | POA: Diagnosis not present

## 2019-04-03 DIAGNOSIS — C3412 Malignant neoplasm of upper lobe, left bronchus or lung: Secondary | ICD-10-CM | POA: Insufficient documentation

## 2019-04-03 DIAGNOSIS — C3411 Malignant neoplasm of upper lobe, right bronchus or lung: Secondary | ICD-10-CM | POA: Diagnosis not present

## 2019-04-03 DIAGNOSIS — C342 Malignant neoplasm of middle lobe, bronchus or lung: Secondary | ICD-10-CM

## 2019-04-03 DIAGNOSIS — R0602 Shortness of breath: Secondary | ICD-10-CM | POA: Insufficient documentation

## 2019-04-03 DIAGNOSIS — J449 Chronic obstructive pulmonary disease, unspecified: Secondary | ICD-10-CM | POA: Insufficient documentation

## 2019-04-03 DIAGNOSIS — Z85118 Personal history of other malignant neoplasm of bronchus and lung: Secondary | ICD-10-CM | POA: Insufficient documentation

## 2019-04-03 DIAGNOSIS — R05 Cough: Secondary | ICD-10-CM | POA: Insufficient documentation

## 2019-04-03 DIAGNOSIS — R911 Solitary pulmonary nodule: Secondary | ICD-10-CM | POA: Diagnosis not present

## 2019-04-03 DIAGNOSIS — C3490 Malignant neoplasm of unspecified part of unspecified bronchus or lung: Secondary | ICD-10-CM | POA: Diagnosis not present

## 2019-04-03 DIAGNOSIS — F17200 Nicotine dependence, unspecified, uncomplicated: Secondary | ICD-10-CM

## 2019-04-03 NOTE — Progress Notes (Signed)
[facility] Telephone:(336) [phone]   Fax:(336) [phone]  OFFICE PROGRESS NOTE  Default, Provider, MD No address on file  PRINCIPAL DIAGNOSIS: 1) stage IIIA (T2a, N2, M0) non-small cell lung cancer, squamous cell carcinoma of the left upper lobe diagnosed in August 2016 2)  Stage IIIA (T2 N2 Mx) non-small cell lung cancer, adenocarcinoma, involving the right upper lobe and mediastinal lymphadenopathy diagnosed in June 2010.   PRIOR THERAPY:  1. Status post concurrent chemoradiation with weekly carboplatin and paclitaxel; last dose was given April 08, 2008. 2. Status post 3 cycles of consolidation chemotherapy with docetaxel; last dose was given on Jun 26, 2008. 3. Concurrent chemoradiation with weekly carboplatin for AUC of 2 and paclitaxel 45 MG/M2. First dose expected on 09/23/2014 status post 7 cycles. Last dose was given 12/01/2014 with partial response. 4. Consolidation chemotherapy with carboplatin for AUC of 5 and paclitaxel 175 MG/M2 every 3 weeks with Neulasta support. First dose 01/02/2015. Status post 3 cycles.   5. Curative SBRT to right middle lobe lung nodule under the care of Dr. [name] completed on 07/24/2018   CURRENT THERAPY: Observation.  INTERVAL HISTORY: Evan [name] 69 y.o. male returns to the clinic today for follow-up visit.  The patient is feeling fine today with no concerning complaints except for the baseline shortness of breath increased with exertion as well as cough productive of whitish sputum.  He denied having any chest pain or hemoptysis.  He denied having any fever or chills.  He has no nausea, vomiting, diarrhea or constipation.  He denied having any headache or visual changes.  He had a PET scan performed recently for evaluation of suspicious disease recurrence in the left lung and the patient is here today for discussion of his PET scan results and recommendation regarding his condition.  MEDICAL HISTORY: Past Medical History:    Diagnosis Date  . Anxiety   . Complication of anesthesia    " sometimes I wake up during surgery "  . Depression   . Left renal mass 07/19/2016  . Lung cancer ([place]) dx'd 2010    NSCL CA right  . Lung cancer ([place]) dx'd 2017   left    ALLERGIES:  has No Known Allergies.  MEDICATIONS:  Current Outpatient Medications  Medication Sig Dispense Refill  . acetaminophen (TYLENOL) 500 MG tablet Take by mouth.    [name] albuterol (PROVENTIL HFA;VENTOLIN HFA) 108 (90 BASE) MCG/ACT inhaler Inhale 2 puffs into the lungs every 6 (six) hours as needed for wheezing or shortness of breath. 1 Inhaler 2  . [employer]S ASPIRIN EC 325 MG EC tablet TAKE 1 TABLET BY MOUTH EVERY DAY 90 tablet 1  . ibuprofen (ADVIL,MOTRIN) 600 MG tablet Take 600 mg by mouth every 6 (six) hours as needed.    . Multiple Vitamin (MULTIVITAMIN WITH MINERALS) TABS tablet Take 1 tablet by mouth daily.    . Nutritional Supplements (CARNATION BREAKFAST ESSENTIALS PO) Take 1 packet by mouth daily.     . Tiotropium Bromide-Olodaterol (STIOLTO RESPIMAT) 2.5-2.5 MCG/ACT AERS Inhale 2 puffs into the lungs daily. 1 Inhaler 5   No current facility-administered medications for this visit.    SURGICAL HISTORY:  Past Surgical History:  Procedure Laterality Date  . AMPUTATION Right 05/21/2012   Procedure: AMPUTATION BELOW KNEE;  Surgeon: Newt Minion, MD;  Location: [facility];  Service: Orthopedics;  Laterality: Right;  . AMPUTATION Right 06/21/2012   Procedure: Revision AMPUTATION BELOW KNEE Right;  Surgeon: Illene Regulus  Sharol Given, MD;  Location: [facility];  Service: Orthopedics;  Laterality: Right;  Revision right Below Knee Amputation  . COLONOSCOPY W/ BIOPSIES AND POLYPECTOMY    . FOOT AMPUTATION Right    traumatic right lower extremity  . UPPER JAW     SURGERY FOR INFECTION    REVIEW OF SYSTEMS:  Constitutional: positive for fatigue Eyes: negative Ears, nose, mouth, throat, and face: negative Respiratory: positive for cough and dyspnea on  exertion Cardiovascular: negative Gastrointestinal: negative Genitourinary:negative Integument/breast: negative Hematologic/lymphatic: negative Musculoskeletal:negative Neurological: negative Behavioral/Psych: negative Endocrine: negative Allergic/Immunologic: negative   PHYSICAL EXAMINATION: General appearance: alert, cooperative, fatigued and no distress Head: Normocephalic, without obvious abnormality, atraumatic Neck: no adenopathy Lymph nodes: Cervical, supraclavicular, and axillary nodes normal. Resp: clear to auscultation bilaterally Back: symmetric, no curvature. ROM normal. No CVA tenderness. Cardio: regular rate and rhythm, S1, S2 normal, no murmur, click, rub or gallop GI: soft, non-tender; bowel sounds normal; no masses,  no organomegaly Extremities: extremities normal, atraumatic, no cyanosis or edema Neurologic: Alert and oriented X 3, normal strength and tone. Normal symmetric reflexes. Normal coordination and gait  ECOG PERFORMANCE STATUS: 1 - Symptomatic but completely ambulatory  Blood pressure (!) 125/92, pulse (!) 102, temperature 98.3 F (36.8 C), temperature source Temporal, resp. rate 18, height 5\' 5"  (1.651 m), weight 132 lb 9.6 oz (60.1 kg), SpO2 99 %.  LABORATORY DATA: Lab Results  Component Value Date   WBC 5.7 03/06/2019   HGB 13.9 03/06/2019   HCT 43.1 03/06/2019   MCV 87.8 03/06/2019   PLT 379 03/06/2019      Chemistry      Component Value Date/Time   NA 138 03/06/2019 1350   NA 141 07/16/2016 1220   K 3.8 03/06/2019 1350   K 4.0 07/16/2016 1220   CL 107 03/06/2019 1350   CL 104 07/19/2012 1432   CO2 22 03/06/2019 1350   CO2 24 07/16/2016 1220   BUN 16 03/06/2019 1350   BUN 8.8 07/16/2016 1220   CREATININE 0.93 03/06/2019 1350   CREATININE 1.0 07/16/2016 1220      Component Value Date/Time   CALCIUM 8.7 (L) 03/06/2019 1350   CALCIUM 9.3 07/16/2016 1220   ALKPHOS 98 03/06/2019 1350   ALKPHOS 92 07/16/2016 1220   AST 16 03/06/2019  1350   AST 18 07/16/2016 1220   ALT 13 03/06/2019 1350   ALT 14 07/16/2016 1220   BILITOT <0.2 (L) 03/06/2019 1350   BILITOT 0.39 07/16/2016 1220       RADIOGRAPHIC STUDIES: CT Chest W Contrast  Result Date: 03/06/2019 CLINICAL DATA:  Non-small cell lung cancer staging EXAM: CT CHEST WITH CONTRAST TECHNIQUE: Multidetector CT imaging of the chest was performed during intravenous contrast administration. CONTRAST:  53mL OMNIPAQUE IOHEXOL 300 MG/ML  SOLN COMPARISON:  09/26/2018, PET-CT, 06/29/2018 FINDINGS: Cardiovascular: Aortic atherosclerosis. Normal heart size. No pericardial effusion. Mediastinum/Nodes: No discretely enlarged mediastinal lymph nodes. Unchanged post treatment appearance of bilateral hilar soft tissue. Interval increase in size and fullness of paramedian left upper lobe mass/prevascular anterior mediastinal soft tissue, measuring approximately 3.7 x 2.8 cm in greatest axial extent, previously 3.2 x 2.1 cm when measured similarly (series 2, image 45). Thyroid gland, trachea, and esophagus demonstrate no significant findings. Lungs/Pleura: No significant change in dense paramedian post treatment fibrotic consolidation of the perihilar lungs and bilateral upper lobes (series 7, image 46). There is a new 6 mm spiculated nodule of the superior segment left lower lobe (series 7, image 42). Unchanged spiculated nodule of  the lateral segment right middle lobe measuring 1.0 x 0.6 cm (series 7, image 67). Unchanged 5-6 mm subpleural nodule of the medial right lower lobe (series 7, image 99). Emphysema. No pleural effusion or pneumothorax. Upper Abdomen: No acute abnormality. Musculoskeletal: No chest wall mass. Unchanged sclerotic appearance of the right aspect of the manubrium (series 7, image 38). IMPRESSION: 1. Interval increase in size and fullness of paramedian left upper lobe mass/prevascular anterior mediastinal soft tissue, concerning for local recurrence of disease. FDG PET may be useful  to assess for recurrent metabolic activity. 2. New 6 mm spiculated nodule of the superior segment left lower lobe, concerning for metachronous primary malignancy or metastasis. 3. Unchanged spiculated nodule of the lateral segment right middle lobe and subpleural nodule of the medial right lower lobe, likewise concerning for metastatic for metachronous disease. 4. Unchanged sclerotic appearance of the right aspect of the manubrium, which may reflect underlying metastatic or radiation necrosis. 5.  Emphysema (ICD10-J43.9). 6.  Aortic Atherosclerosis (ICD10-I70.0). Electronically Signed   By: Eddie Candle M.D.   On: 03/06/2019 17:00   NM PET Image Restag (PS) Skull Base To Thigh  Result Date: 03/30/2019 CLINICAL DATA:  Subsequent treatment strategy for non-small-cell lung cancer. EXAM: NUCLEAR MEDICINE PET SKULL BASE TO THIGH TECHNIQUE: 6.8 mCi F-18 FDG was injected intravenously. Full-ring PET imaging was performed from the skull base to thigh after the radiotracer. CT data was obtained and used for attenuation correction and anatomic localization. Fasting blood glucose: 104 mg/dl COMPARISON:  06/29/2018 FINDINGS: Mediastinal blood pool activity: SUV max 2.2 Liver activity: SUV max NA NECK: No hypermetabolic lymph nodes in the neck. Incidental CT findings: none CHEST: No hypermetabolic mediastinal or hilar lymph nodes. 7 mm left apical nodule seen previously is no longer evident. 10 x 6 mm nodular component associated with scarring in the medial left upper lobe previously is no longer evident may have become incorporated into the left paramediastinal soft tissue density presumably treatment related fibrosis (image 57/4). This region shows low level uptake with SUV max = 3.9. Similar low level FDG uptake is identified in the right parahilar scarring. Posterior right middle lobe 8 x 11 mm nodule identified previously is 8 x 8 mm today. This shows low level FDG uptake with SUV max = 1.6 compared to 3.7 previously. 6  mm nodule in the superior segment left lower lobe (21/8) is new in the interval and demonstrates hypermetabolism with SUV max = 4.6. CT findings: Atherosclerotic calcification is noted in the wall of the thoracic aorta. No pleural effusion. Centrilobular emphsyema noted. ABDOMEN/PELVIS: No abnormal hypermetabolic activity within the liver, pancreas, adrenal glands, or spleen. No hypermetabolic lymph nodes in the abdomen or pelvis. Incidental CT findings: There is abdominal aortic atherosclerosis without aneurysm. Prostate gland is dramatically enlarged. SKELETON: No focal hypermetabolic activity to suggest skeletal metastasis. Incidental CT findings: none IMPRESSION: 1. Interval development of a new hypermetabolic 6 mm nodule in the superior segment left lower lobe. Imaging features highly concerning for metastatic disease. Metachronous primary considered less likely. 2. Interval decrease in size and hypermetabolism of the posterior right middle lobe pulmonary nodule identified previously. 3. 7 mm nodule seen previously in the left apex has resolved in the interval. 4. 10 mm nodule in the anterior left upper lobe adjacent to the post treatment scarring is no longer evident and may have been, incorporated into the area of scarring. This region shows only low level FDG accumulation. Continued attention on follow-up recommended. 5. No evidence for hypermetabolic  metastatic disease in the neck, abdomen, or pelvis. 6.  Aortic Atherosclerois (ICD10-170.0) Electronically Signed   By: Misty Stanley M.D.   On: 03/30/2019 14:17    ASSESSMENT AND PLAN:  This is a very pleasant 69 years old African-American male with history of 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 The patient has disease recurrence on the left side presented as a stage IIIa non-small cell lung cancer, squamous cell carcinoma status  post concurrent chemoradiation followed by consolidation chemotherapy. Completed in March 2017. He also underwent SBRT to the right middle lobe lung nodule under the care of Dr. [name] completed on 07/24/2018. The patient has been on observation since that time.  He had a recent CT scan of the chest that showed concerning disease progression in the left upper lobe pulmonary mass versus radiation changes.  I ordered a PET scan which was performed recently and showed no significant hypermetabolic activity in that area but there was new 6 mm superior segment left lower lobe pulmonary nodule that is hypermetabolic and suspicious for new primary lung cancer. I personally and independently reviewed the scan images and discussed the results with the patient today. I recommended for the patient to see Dr. [name] for consideration of SBRT to the new hypermetabolic right lower lobe lung nodule. I will see the patient back for follow-up visit in 4 months for evaluation with repeat CT scan of the chest for restaging of his disease after the treatment. For the COPD in persistent shortness of breath, he was advised to follow with Dr. Elsworth Soho for adjustment of his inhaler and treatment of his condition. The patient was advised to call immediately if he has any other concerning symptoms in the interval. His scan showed evidence for disease progression and the left upper lobe pulmonary mass which could be remodeling of the scarring in that area versus disease recurrence.  All questions were answered. The patient knows to call the clinic with any problems, questions or concerns. We can certainly see the patient much sooner if necessary.   Disclaimer: This note was dictated with voice recognition software. Similar sounding words can inadvertently be transcribed and may not be corrected upon review.

## 2019-04-04 ENCOUNTER — Telehealth: Payer: Self-pay | Admitting: Internal Medicine

## 2019-04-04 NOTE — Telephone Encounter (Signed)
Scheduled per los. Called and spoke with patient. Confirmed appt 

## 2019-04-06 ENCOUNTER — Ambulatory Visit
Admission: RE | Admit: 2019-04-06 | Discharge: 2019-04-06 | Disposition: A | Discharge status: Home / Self Care | Payer: Medicare PPO | Source: Ambulatory Visit | Attending: Urology | Admitting: Urology

## 2019-04-06 ENCOUNTER — Encounter: Payer: Self-pay | Admitting: Urology

## 2019-04-06 ENCOUNTER — Ambulatory Visit
Admission: RE | Admit: 2019-04-06 | Discharge: 2019-04-06 | Disposition: A | Discharge status: Home / Self Care | Payer: Medicare PPO | Source: Ambulatory Visit | Attending: Radiation Oncology | Admitting: Radiation Oncology

## 2019-04-06 ENCOUNTER — Other Ambulatory Visit: Payer: Self-pay

## 2019-04-06 DIAGNOSIS — C342 Malignant neoplasm of middle lobe, bronchus or lung: Secondary | ICD-10-CM

## 2019-04-06 DIAGNOSIS — C3432 Malignant neoplasm of lower lobe, left bronchus or lung: Secondary | ICD-10-CM

## 2019-04-06 NOTE — Progress Notes (Addendum)
Radiation Oncology         (336) [phone] ________________________________  Name: Evan [name] MRN: [id number]  Date: 04/06/2019  DOB: Apr 01, 1950  Re-Consultation Note - Conducted via telephone due to current COVID-19 concerns for limiting patient exposure  CC: Default, Provider, MD  Curt Bears, MD  Diagnosis:   69 y.o. male with a new, 56m LLL nodule concerning for recurrence of non-small cell lung cancer.  Interval Since Last Radiation:  8 months, 2 weeks  07/17/18, 07/19/18, 07/24/18//SBRT: The RML and LUL targets were treated to 54 Gy in 3 fractions of 18 Gy  09/23/2014-11/08/2014: The Left upper lung tumor was treated to 66 Gy in 33 fractions of 2 Gy, concurrent with chemotherapy.  2010: Right upper lung (concurrent with systemic chemotherapy):   Narrative:  The patient returns today for re-consultation for consideration of SBRT to a new hypermetabolic 670mleft lower lobe lung nodule. In summary, he has a history of Stage IIIA (T2 N2 Mx) non-small cell lung cancer, adenocarcinoma, involving the right upper lobe and mediastinal lymphadenopathy initially diagnosed in 2010 and treated with concurrent chemoradiation followed by consolidation chemotherapy which was completed 06/26/2008. He also has a history of stage IIIA (T2a, N2, M0) non-small cell lung cancer, squamous cell carcinoma of the left upper lobe diagnosed in August 2016 and treated with concurrent chemoradiation followed by consolidation chemotherapy which was completed in February 2017. He continued in observation with stable disease on surveillance imaging, until January 2020, when his follow-up CT chest from 02/24/2018 showed interval enlargement of two left apical lung nodules and enlargement of a right upper lobe nodule but without enlarged mediastinal or hilar lymph nodes. A repeat chest CT scan on 05/26/2018 showed progressive enlargement of an irregular, spiculated right middle lobe nodule, measuring 11 x 8 mm, suspicious  for primary lung cancer. The left atypical nodularity had not significantly changed over the prior 3 months, but progressed from older prior studies. These were less specific but were near the recurrence demonstrated on PET-CT in 2016. Otherwise, stable paramediastinal radiation changes in both upper lobes and underlying emphysema. He underwent PET scan on 06/29/2018 for further evaluation and this confirmed a hypermetabolic 11 mm irregular posterior right middle lobe pulmonary nodule as well as a hypermetabolic 10 mm anterior left upper lobe nodule with curvilinear scarring, concerning for recurrent or metastatic disease. There was a second 7 mm left apical nodule that was not hypermetabolic as well as a small focus of asymmetric hypermetabolism identified in the posterior left nasopharynx and focal hypermetabolism in an enlarged prostate gland towards the apex.  His case was presented and discussed at the Multidisciplinary Thoracic Oncology Conference on 07/06/2018. The consensus recommendation was to proceed with SBRT to the hypermetabolic enlarging right middle lobe lung nodule as well as the 1 cm hypermetabolic left upper lobe lung nodule. Due to the small size and location of these lesions, no biopsy was recommended.  The patient was in agreement and completed SBRT treatment to these two lung lesions on 07/24/18, which he tolerated well.  INTERVAL HISTORY: Since his most recent follow up here on 08/24/2018, he has continued in observation under the care and direction of Dr. MoJulien NordmannA surveillance chest CT scan performed on 03/06/2019 revealed an interval increase in size and fullness of a paramedian LUL mass/prevascular anterior mediastinal soft tissue, concerning for local recurrence of disease.  Additionally, there was a new 6 mm spiculated LLL nodule, concerning for metachronous primary or metastasis, an unchanged spiculated RML nodule and  subpleural RLL nodule and unchanged sclerotic appearance of the  manubrium. PET scan was performed on 03/30/2019 for further evaluation and this confirmed interval development of a new, hypermetabolic 6 mm nodule in the LLL, highly concerning for metastatic disease. There was interval decrease in size and metabolism in the RML nodule. The left apex nodule and LUL nodule were no longer evident and there was no evidence for hypermetabolic disease in the neck, abdomen, or pelvis. The paramediastinal soft tissue density is presumably treatment related fibrosis due to low level uptake, similar to that seen in the right perihilar scarring. Given the patient's known history of NSCLC, and the small size of this nodule, tissue biopsy is not likely to be successful and not felt to be necessary in his case.  He met with Dr. Julien Nordmann on 04/03/2019 to discuss these results and has been kindly referred back to [place] for consideration of SBRT to the new hypermetabolic LLL lung nodule.  Of note, brain MRI performed on 08/29/2018 was negative for metastatic disease and acute intracranial abnormality.  On review of systems, the patient reports that he is doing well in general.  He has noted progressive shortness of breath both at rest and with activity. He has not seen his pulmonologist in quite some time and thinks he needs to have his inhalers refilled as these have improved similar symptoms in the past.  He denies any recent fevers, chills or night sweats. He does have a chronic, dry cough with occasional production of clear mucous. He has not had chest pain or hemoptysis and denies N/V/D.  He denies headaches, changes in auditory or visual acuity, dizziness or tremor.  ALLERGIES:  has No Known Allergies.  Meds: Current Outpatient Medications  Medication Sig Dispense Refill  . acetaminophen (TYLENOL) 500 MG tablet Take by mouth.    [name] albuterol (PROVENTIL HFA;VENTOLIN HFA) 108 (90 BASE) MCG/ACT inhaler Inhale 2 puffs into the lungs every 6 (six) hours as needed for wheezing or shortness of  breath. 1 Inhaler 2  . [employer] ASPIRIN EC 325 MG EC tablet TAKE 1 TABLET BY MOUTH EVERY DAY 90 tablet 1  . ibuprofen (ADVIL,MOTRIN) 600 MG tablet Take 600 mg by mouth every 6 (six) hours as needed.    . Multiple Vitamin (MULTIVITAMIN WITH MINERALS) TABS tablet Take 1 tablet by mouth daily.    . Nutritional Supplements (CARNATION BREAKFAST ESSENTIALS PO) Take 1 packet by mouth daily.     . Tiotropium Bromide-Olodaterol (STIOLTO RESPIMAT) 2.5-2.5 MCG/ACT AERS Inhale 2 puffs into the lungs daily. 1 Inhaler 5   No current facility-administered medications for this encounter.    Physical Findings:  vitals were not taken for this visit.   /Unable to assess due to telephone visit format.   Lab Findings: Lab Results  Component Value Date   WBC 5.7 03/06/2019   HGB 13.9 03/06/2019   HCT 43.1 03/06/2019   MCV 87.8 03/06/2019   PLT 379 03/06/2019     Radiographic Findings: NM PET Image Restag (PS) Skull Base To Thigh  Result Date: 03/30/2019 CLINICAL DATA:  Subsequent treatment strategy for non-small-cell lung cancer. EXAM: NUCLEAR MEDICINE PET SKULL BASE TO THIGH TECHNIQUE: 6.8 mCi F-18 FDG was injected intravenously. Full-ring PET imaging was performed from the skull base to thigh after the radiotracer. CT data was obtained and used for attenuation correction and anatomic localization. Fasting blood glucose: 104 mg/dl COMPARISON:  06/29/2018 FINDINGS: Mediastinal blood pool activity: SUV max 2.2 Liver activity: SUV max NA NECK: No hypermetabolic  lymph nodes in the neck. Incidental CT findings: none CHEST: No hypermetabolic mediastinal or hilar lymph nodes. 7 mm left apical nodule seen previously is no longer evident. 10 x 6 mm nodular component associated with scarring in the medial left upper lobe previously is no longer evident may have become incorporated into the left paramediastinal soft tissue density presumably treatment related fibrosis (image 57/4). This region shows low level uptake with SUV  max = 3.9. Similar low level FDG uptake is identified in the right parahilar scarring. Posterior right middle lobe 8 x 11 mm nodule identified previously is 8 x 8 mm today. This shows low level FDG uptake with SUV max = 1.6 compared to 3.7 previously. 6 mm nodule in the superior segment left lower lobe (21/8) is new in the interval and demonstrates hypermetabolism with SUV max = 4.6. CT findings: Atherosclerotic calcification is noted in the wall of the thoracic aorta. No pleural effusion. Centrilobular emphsyema noted. ABDOMEN/PELVIS: No abnormal hypermetabolic activity within the liver, pancreas, adrenal glands, or spleen. No hypermetabolic lymph nodes in the abdomen or pelvis. Incidental CT findings: There is abdominal aortic atherosclerosis without aneurysm. Prostate gland is dramatically enlarged. SKELETON: No focal hypermetabolic activity to suggest skeletal metastasis. Incidental CT findings: none IMPRESSION: 1. Interval development of a new hypermetabolic 6 mm nodule in the superior segment left lower lobe. Imaging features highly concerning for metastatic disease. Metachronous primary considered less likely. 2. Interval decrease in size and hypermetabolism of the posterior right middle lobe pulmonary nodule identified previously. 3. 7 mm nodule seen previously in the left apex has resolved in the interval. 4. 10 mm nodule in the anterior left upper lobe adjacent to the post treatment scarring is no longer evident and may have been, incorporated into the area of scarring. This region shows only low level FDG accumulation. Continued attention on follow-up recommended. 5. No evidence for hypermetabolic metastatic disease in the neck, abdomen, or pelvis. 6.  Aortic Atherosclerois (ICD10-170.0) Electronically Signed   By: Misty Stanley M.D.   On: 03/30/2019 14:17    Impression/Plan: This visit was conducted via telephone to spare the patient unnecessary potential exposure in the healthcare setting during the  current COVID-19 pandemic. 1. 68 y.o. male with a new, 31m LLL nodule concerning for recurrence of non-small cell lung cancer.  Today, we talked to the patient about the findings and workup thus far. We reviewed the natural history of non-small cell lung cancer and general treatment, highlighting the role of radiotherapy in the management. He is very familiar with radiation therapy. We discussed the available radiation techniques, and focused on the details of logistics and delivery. The recommendation is to proceed with another short course of SBRT to the new hypermetabolic 6 mm LLL nodule, anticipating 3-5 fractions. We reviewed the anticipated acute and late sequelae associated with radiation in this setting. The patient was encouraged to ask questions that were answered to his stated satisfaction.  At the end of our conversation, the patient would like to proceed with treatment. He has provided verbal consent to proceed today and is tentatively scheduled for CT Simulation/treatment planning at 2:30pm on Wednesday 04/11/19.  He will sign formal written consent at that time and a copy of this document will be placed in his chart. We will share our discussion with Dr. MJulien Nordmannand move forward with treatment planning in anticipation of beginning his radiation treatment in the near future.   Given current concerns for patient exposure during the COVID-19 pandemic, this encounter  was conducted via telephone. The patient was notified in advance and was offered a [place] meeting to allow for face to face communication but unfortunately reported that he did not have the appropriate resources/technology to support such a visit and instead preferred to proceed with telephone consult. The patient has given verbal consent for this type of encounter. The time spent during this encounter was 30 minutes. The attendants for this meeting include Tyler Pita MD, Sharron Petruska PA-C, and patient, [name]. During the encounter, Tyler Pita MD and Freeman Caldron PA-C were located at [facility].  Patient, RACHEL SAMPLES was located at home.     Nicholos Johns, PA-C    Tyler Pita, MD  [facility] Direct Dial: [phone]  Fax: [phone] [email or web address]  Skype  LinkedIn   This document serves as a record of services personally performed by Tyler Pita, MD and Freeman Caldron, PA-C. It was created on their behalf by Wilburn Mylar, a trained medical scribe. The creation of this record is based on the scribe's personal observations and the provider's statements to them. This document has been checked and approved by the attending provider.

## 2019-04-09 ENCOUNTER — Telehealth: Payer: Self-pay | Admitting: *Deleted

## 2019-04-09 NOTE — Telephone Encounter (Signed)
Patient wanted inhalers refilled.  Patient is existing patient with Dr. Elsworth Soho pulmonologist.  Advised to get follow up appt with Dr. Elsworth Soho to evaluate need for inhalers.

## 2019-04-11 ENCOUNTER — Ambulatory Visit
Admission: RE | Admit: 2019-04-11 | Discharge: 2019-04-11 | Disposition: A | Discharge status: Home / Self Care | Payer: Medicare PPO | Source: Ambulatory Visit | Attending: Radiation Oncology | Admitting: Radiation Oncology

## 2019-04-11 ENCOUNTER — Other Ambulatory Visit: Payer: Self-pay

## 2019-04-11 DIAGNOSIS — C3432 Malignant neoplasm of lower lobe, left bronchus or lung: Secondary | ICD-10-CM | POA: Insufficient documentation

## 2019-04-11 DIAGNOSIS — Z510 Encounter for antineoplastic radiation therapy: Secondary | ICD-10-CM | POA: Diagnosis not present

## 2019-04-11 NOTE — Progress Notes (Signed)
  Radiation Oncology         [phone]) [phone] ________________________________  Name: ADHRIT KRENZ MRN: [id number]  Date: 04/11/2019  DOB: 06-06-1950  STEREOTACTIC BODY RADIOTHERAPY SIMULATION AND TREATMENT PLANNING NOTE    ICD-10-CM   1. Primary cancer of left lower lobe of lung (HCC)  C34.32     DIAGNOSIS:  69 yo man with a new primary stage cT1a (6 mm) cN0 cM0 non-small cell carcinoma of the left lower lung - Stage IA1  NARRATIVE:  The patient was brought to the [place].  Identity was confirmed.  All relevant records and images related to the planned course of therapy were reviewed.  The patient freely provided informed written consent to proceed with treatment after reviewing the details related to the planned course of therapy. The consent form was witnessed and verified by the simulation staff.  Then, the patient was set-up in a stable reproducible  supine position for radiation therapy.  A BodyFix immobilization pillow was fabricated for reproducible positioning.  Then I personally applied the abdominal compression paddle to limit respiratory excursion.  4D respiratoy motion management CT images were obtained.  Surface markings were placed.  The CT images were loaded into the planning software.  Then, using Cine, MIP, and standard views, the internal target volume (ITV) and planning target volumes (PTV) were delinieated, and avoidance structures were contoured.  Treatment planning then occurred.  The radiation prescription was entered and confirmed.  A total of two complex treatment devices were fabricated in the form of the BodyFix immobilization pillow and a neck accuform cushion.  I have requested : 3D Simulation  I have requested a DVH of the following structures: Heart, Lungs, Esophagus, Chest Wall, Brachial Plexus, Major Blood Vessels, and targets.  SPECIAL TREATMENT PROCEDURE:  The planned course of therapy using radiation constitutes a special treatment procedure.  Special care is required in the management of this patient for the following reasons. This treatment constitutes a Special Treatment Procedure for the following reason: [ High dose per fraction requiring special monitoring for increased toxicities of treatment including daily imaging..  The special nature of the planned course of radiotherapy will require increased physician supervision and oversight to ensure patient's safety with optimal treatment outcomes.  RESPIRATORY MOTION MANAGEMENT SIMULATION:  In order to account for effect of respiratory motion on target structures and other organs in the planning and delivery of radiotherapy, this patient underwent respiratory motion management simulation.  To accomplish this, when the patient was brought to the CT simulation planning suite, 4D respiratoy motion management CT images were obtained.  The CT images were loaded into the planning software.  Then, using a variety of tools including Cine, MIP, and standard views, the target volume and planning target volumes (PTV) were delineated.  Avoidance structures were contoured.  Treatment planning then occurred.  Dose volume histograms were generated and reviewed for each of the requested structure.  The resulting plan was carefully reviewed and approved today.  PLAN:  The patient will receive 54 Gy in 3 fractions.  ________________________________  [name] [name], M.D.

## 2019-04-18 DIAGNOSIS — Z510 Encounter for antineoplastic radiation therapy: Secondary | ICD-10-CM | POA: Diagnosis not present

## 2019-04-20 ENCOUNTER — Other Ambulatory Visit: Payer: Self-pay

## 2019-04-20 ENCOUNTER — Ambulatory Visit
Admission: RE | Admit: 2019-04-20 | Discharge: 2019-04-20 | Disposition: A | Discharge status: Home / Self Care | Payer: Medicare PPO | Source: Ambulatory Visit | Attending: Radiation Oncology | Admitting: Radiation Oncology

## 2019-04-20 DIAGNOSIS — Z510 Encounter for antineoplastic radiation therapy: Secondary | ICD-10-CM | POA: Diagnosis not present

## 2019-04-24 ENCOUNTER — Other Ambulatory Visit: Payer: Self-pay

## 2019-04-24 ENCOUNTER — Institutional Professional Consult (permissible substitution): Payer: Medicare PPO | Admitting: Pulmonary Disease

## 2019-04-24 ENCOUNTER — Ambulatory Visit
Admission: RE | Admit: 2019-04-24 | Discharge: 2019-04-24 | Disposition: A | Discharge status: Home / Self Care | Payer: Medicare PPO | Source: Ambulatory Visit | Attending: Radiation Oncology | Admitting: Radiation Oncology

## 2019-04-24 DIAGNOSIS — Z510 Encounter for antineoplastic radiation therapy: Secondary | ICD-10-CM | POA: Diagnosis not present

## 2019-04-25 ENCOUNTER — Ambulatory Visit (INDEPENDENT_AMBULATORY_CARE_PROVIDER_SITE_OTHER): Payer: Medicare PPO | Admitting: Pulmonary Disease

## 2019-04-25 ENCOUNTER — Encounter: Payer: Self-pay | Admitting: Pulmonary Disease

## 2019-04-25 DIAGNOSIS — C3412 Malignant neoplasm of upper lobe, left bronchus or lung: Secondary | ICD-10-CM | POA: Diagnosis not present

## 2019-04-25 DIAGNOSIS — J441 Chronic obstructive pulmonary disease with (acute) exacerbation: Secondary | ICD-10-CM

## 2019-04-25 DIAGNOSIS — F17200 Nicotine dependence, unspecified, uncomplicated: Secondary | ICD-10-CM | POA: Diagnosis not present

## 2019-04-25 MED ORDER — ALBUTEROL SULFATE HFA 108 (90 BASE) MCG/ACT IN AERS: 2.0000 | INHALATION_SPRAY | Freq: Four times a day (QID) | RESPIRATORY_TRACT | 3 refills | Status: DC | PRN

## 2019-04-25 MED ORDER — PREDNISONE 10 MG PO TABS: ORAL_TABLET | ORAL | 0 refills | Status: DC

## 2019-04-25 MED ORDER — ANORO ELLIPTA 62.5-25 MCG/INH IN AEPB: 1.0000 | INHALATION_SPRAY | Freq: Every day | RESPIRATORY_TRACT | 0 refills | Status: DC

## 2019-04-25 NOTE — Addendum Note (Signed)
Addended by: Lia Foyer R on: 04/25/2019 12:35 PM   Modules accepted: Orders
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
## 2019-04-25 NOTE — Assessment & Plan Note (Signed)
We will treat him as COPD exacerbation with steroids, does not appear to be infective so does not need antibiotics  Prednisone 10 mg tabs Take 4 tabs  daily with food x 4 days, then 3 tabs daily x 4 days, then 2 tabs daily x 4 days, then 1 tab daily x4 days then stop. #40  We will need maintenance inhaler  - trial of Anoro -1 puff daily, call me for prescription if this works.  Prescription for albuterol MDI 2 puffs every 6 hours as needed for wheezing/shortness of breath. If this does not work, contact and we will get you nebulizer machine

## 2019-04-25 NOTE — Assessment & Plan Note (Signed)
Tragic that he is still smoking intermittently in spite of having cancer 4 times. Emphasized complete cessation of tobacco

## 2019-04-25 NOTE — Patient Instructions (Signed)
Prednisone 10 mg tabs Take 4 tabs  daily with food x 4 days, then 3 tabs daily x 4 days, then 2 tabs daily x 4 days, then 1 tab daily x4 days then stop. #40  Trial of Anoro -1 puff daily, call me for prescription if this works.  Prescription for albuterol MDI 2 puffs every 6 hours as needed for wheezing/shortness of breath. If this does not work, contact and we will get you nebulizer machine  You have to quit smoking!

## 2019-04-25 NOTE — Assessment & Plan Note (Signed)
Undergoing SBRT to left lower lobe nodule. Some concern of recurrence in left upper lobe, SUV was 3.9 on PET, personally reviewed, surveillance CT per oncology

## 2019-04-27 ENCOUNTER — Ambulatory Visit
Admission: RE | Admit: 2019-04-27 | Discharge: 2019-04-27 | Disposition: A | Discharge status: Home / Self Care | Payer: Medicare PPO | Source: Ambulatory Visit | Attending: Radiation Oncology | Admitting: Radiation Oncology

## 2019-04-27 ENCOUNTER — Other Ambulatory Visit: Payer: Self-pay

## 2019-04-27 DIAGNOSIS — Z510 Encounter for antineoplastic radiation therapy: Secondary | ICD-10-CM | POA: Diagnosis not present

## 2019-04-29 NOTE — Telephone Encounter (Signed)
This encounter was created in error - please disregard.

## 2019-04-30 ENCOUNTER — Ambulatory Visit: Payer: Medicare PPO | Attending: Internal Medicine

## 2019-04-30 DIAGNOSIS — Z23 Encounter for immunization: Secondary | ICD-10-CM

## 2019-04-30 NOTE — Progress Notes (Signed)
   Covid-19 Vaccination Clinic  Name:  Evan [name]    MRN: [id number] DOB: May 28, 1950  04/30/2019  [name] was observed post Covid-19 immunization for 15 minutes without incident. He was provided with Vaccine Information Sheet and instruction to access the V-Safe system.   [name] was instructed to call 911 with any severe reactions post vaccine: [name] Difficulty breathing  . Swelling of face and throat  . A fast heartbeat  . A bad rash all over body  . Dizziness and weakness   Immunizations Administered    Name Date Dose VIS Date Route   Pfizer COVID-19 Vaccine 04/30/2019  2:55 PM 0.3 mL 01/12/2019 Intramuscular   Manufacturer: [place]   Lot: [id number]   [place]: [id number]

## 2019-05-10 ENCOUNTER — Telehealth: Payer: Self-pay | Admitting: Pulmonary Disease

## 2019-05-10 MED ORDER — ANORO ELLIPTA 62.5-25 MCG/INH IN AEPB: 1.0000 | INHALATION_SPRAY | Freq: Every day | RESPIRATORY_TRACT | 4 refills | Status: DC

## 2019-05-10 NOTE — Telephone Encounter (Signed)
Spoke with pt and advised rx for [facility]. Nothing further is needed.

## 2019-05-23 ENCOUNTER — Ambulatory Visit: Payer: Medicare PPO | Attending: Internal Medicine

## 2019-05-23 DIAGNOSIS — Z23 Encounter for immunization: Secondary | ICD-10-CM

## 2019-05-23 NOTE — Progress Notes (Signed)
   Covid-19 Vaccination Clinic  Name:  HASSON GASPARD    MRN: [id number] DOB: [birth year]  05/23/2019  Mr. Pursley was observed post Covid-19 immunization for 15 minutes without incident. He was provided with Vaccine Information Sheet and instruction to access the V-Safe system.   Mr. Haueter was instructed to call 911 with any severe reactions post vaccine: [name] Difficulty breathing  . Swelling of face and throat  . A fast heartbeat  . A bad rash all over body  . Dizziness and weakness   Immunizations Administered    Name Date Dose VIS Date Route   Pfizer COVID-19 Vaccine 05/23/2019  1:04 PM 0.3 mL 03/28/2018 Intramuscular   Manufacturer: [place]   Lot: [id number]   [place]: [id number]

## 2019-05-30 ENCOUNTER — Telehealth: Payer: Self-pay

## 2019-05-30 ENCOUNTER — Other Ambulatory Visit: Payer: Self-pay

## 2019-05-30 NOTE — Telephone Encounter (Signed)
Appointment reminder for telephone encounter on 05/31/18 verbalized he understood this is a telephone encounter

## 2019-05-31 ENCOUNTER — Other Ambulatory Visit: Payer: Self-pay

## 2019-05-31 ENCOUNTER — Encounter: Payer: Self-pay | Admitting: Urology

## 2019-05-31 ENCOUNTER — Ambulatory Visit
Admission: RE | Admit: 2019-05-31 | Discharge: 2019-05-31 | Disposition: A | Discharge status: Home / Self Care | Payer: Medicare PPO | Source: Ambulatory Visit | Attending: Urology | Admitting: Urology

## 2019-05-31 ENCOUNTER — Ambulatory Visit: Payer: Medicare PPO | Admitting: Pulmonary Disease

## 2019-05-31 DIAGNOSIS — C3432 Malignant neoplasm of lower lobe, left bronchus or lung: Secondary | ICD-10-CM

## 2019-05-31 NOTE — Progress Notes (Signed)
Radiation Oncology         [phone] ________________________________  Name: Evan [name] MRN: [id number]  Date: 05/31/2019  DOB: Aug 03, 1950  Post Treatment Note  CC: Default, Provider, MD  Curt Bears, MD  Diagnosis:   69 y.o.male with a new, 48m LLL nodule concerning for recurrence of non-small cell lung cancer.  Interval Since Last Radiation:  5 weeks  04/20/19, 04/24/19, 04/27/19//SBRT:  The LLL target was treated to a total dose of 54 Gy in 3 fractions of 18 Gy each.  07/17/18, 07/19/18, 07/24/18//SBRT: The RML and LUL targets were treated to 54 Gy in 3 fractions of 18 Gy  09/23/2014-11/08/2014: The Left upper lung tumor was treated to 66 Gy in 33 fractions of 2 Gy, concurrent with chemotherapy.  2010: Right upper lung (concurrent with systemic chemotherapy):    Narrative:  I spoke with the patient to conduct his routine scheduled 1 month follow up visit via telephone to spare the patient unnecessary potential exposure in the healthcare setting during the current COVID-19 pandemic.  The patient was notified in advance and gave permission to proceed with this visit format. In summary, he has a history of Stage IIIA (T2 N2 Mx) non-small cell lung cancer, adenocarcinoma, involving the right upper lobe and mediastinal lymphadenopathy initially diagnosed in 2010 and treated with concurrent chemoradiation followed by consolidation chemotherapy which was completed 06/26/2008. 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 He continued in observation with stable disease on surveillance imaging, until January 2020, when his follow-up CT chest from 02/24/2018 showed interval enlargement of two left apical lung nodules and enlargement of a right upper lobe nodule but without  enlarged mediastinal or hilar lymph nodes. A repeat chest CT scan on 05/26/2018 showed progressive enlargement of an irregular, spiculated right middle lobe nodule, measuring 11 x 8 mm, suspicious for primary lung cancer. The left atypical nodularity had not significantly changed over the prior 3 months, but progressed from older prior studies. These were less specific but were near the recurrence demonstrated on PET-CT in 2016. Otherwise, stable paramediastinal radiation changes in both upper lobes and underlying emphysema. He underwent PET scan on 5/28/2020for further evaluation and this confirmed a hypermetabolic11 mm irregular posterior right middle lobe pulmonary nodule as well as a hypermetabolic10 mm anterior left upper lobe nodule with curvilinear scarring, concerning for recurrent or metastatic disease. There was a second 7 mm left apical nodulethat was nothypermetabolic as well as asmall focus of asymmetric hypermetabolism identified in the posterior left nasopharynx andfocal hypermetabolism in an enlargedprostate gland towards the apex.  [facility]. The consensus recommendation was to proceed withSBRT to the hypermetabolicenlarging right middle lobe lung noduleas well as the1 cm hypermetabolicleft upper lobe lung nodule.Due to the small size and location of these lesions, no biopsy was recommended.  The patient was in agreement and completed SBRT treatment to these two lung lesions on 07/24/18, which he tolerated well.      Brain MRI performed on 08/29/2018 was negative for metastatic disease and acute intracranial abnormality.   He had continued in observation under the care and direction of Dr. MJulien Nordmann A surveillance chest CT scan performed on 03/06/2019 revealed an interval increase in size and fullness of a paramedian LUL mass/prevascular anterior mediastinal  soft tissue, concerning for local  recurrence of disease.  Additionally, there was a new 6 mm spiculated LLL nodule, concerning for metachronous primary or metastasis, an unchanged spiculated RML nodule and subpleural RLL nodule and unchanged sclerotic appearance of the manubrium. PET scan was performed on 03/30/2019 for further evaluation and this confirmed interval development of a new, hypermetabolic 6 mm nodule in the LLL, highly concerning for metastatic disease. There was interval decrease in size and metabolism in the RML nodule. The left apex nodule and LUL nodule were no longer evident and there was no evidence for hypermetabolic disease in the neck, abdomen, or pelvis. The paramediastinal soft tissue density is presumably treatment related fibrosis due to low level uptake, similar to that seen in the right perihilar scarring. Given the patient's known history of NSCLC, and the small size of this nodule, tissue biopsy was not felt likely to be successful and not felt to be necessary in his case.  He met with Dr. Julien Nordmann on 04/03/2019 to discuss these results and was referred back to [place] for consideration of SBRT to the new hypermetabolic LLL lung nodule which he agreed to proceed with.  He completed a 3 fraction course of SBRT to the LLL nodule on 04/27/19 which he tolerated quite well.  INTERVAL HISTORY:  He tolerated the radiation treatment relatively well, with only mild dysphagia and fatigue but otherwise, tolerated very well without significant ill side effects.   On review of systems, the patient states that he is doing fairly well overall.  The dysphagia is gradually improving and he is able to eat what he wants to at this point.  He does have some improved appetite this week so he has been trying to put some weight back on by eating larger portions. He continues with fatigue and significant dyspnea on exertion, unchanged recently.  He is followed with Dr. Elsworth Soho and was given a new inhaler to try recently, Anoro, and he reports that  this was fantastic.  He felt so much better when he was using it but unfortunately, it was just a sample and when he ran out and went to pick up the Rx for it at the pharmacy it was incredibly expensive and he could not afford it.  He has continued to use his rescue albuterol inhaler regularly throughout the day to help with the chest tightness that he feels periodically when he is ShOB. He continues with a productive cough with clear-whitish sputum but denies hemoptysis or coloration in the mucus. He did get his second COVID19 vaccine on 05/23/19 and has felt fatigued and achy since that time but gradually improving. He denies any recent chest pain, fevers or chills.  ALLERGIES:  has No Known Allergies.  Meds: Current Outpatient Medications  Medication Sig Dispense Refill  . acetaminophen (TYLENOL) 500 MG tablet Take by mouth.    [name] albuterol (VENTOLIN HFA) 108 (90 Base) MCG/ACT inhaler Inhale 2 puffs into the lungs every 6 (six) hours as needed for wheezing or shortness of breath. 18 g 3  . [facility] ASPIRIN EC 325 MG EC tablet TAKE 1 TABLET BY MOUTH EVERY DAY 90 tablet 1  . ibuprofen (ADVIL,MOTRIN) 600 MG tablet Take 600 mg by mouth every 6 (six) hours as needed.    . Multiple Vitamin (MULTIVITAMIN WITH MINERALS) TABS tablet Take 1 tablet by mouth daily.    . Nutritional Supplements (CARNATION BREAKFAST ESSENTIALS PO) Take 1 packet by mouth daily.     [name] umeclidinium-vilanterol (ANORO ELLIPTA) 62.5-25  MCG/INH AEPB Inhale 1 puff into the lungs daily. 60 each 4  . albuterol (PROVENTIL HFA;VENTOLIN HFA) 108 (90 BASE) MCG/ACT inhaler Inhale 2 puffs into the lungs every 6 (six) hours as needed for wheezing or shortness of breath. (Patient not taking: Reported on 04/25/2019) 1 Inhaler 2  . predniSONE (DELTASONE) 10 MG tablet Take 4 tabs  daily with food x 4 days, then 3 tabs daily x 4 days, then 2 tabs daily x 4 days, then 1 tab daily x4 days then STOP. (Patient not taking: Reported on 05/30/2019) 40 tablet 0  .  Tiotropium Bromide-Olodaterol (STIOLTO RESPIMAT) 2.5-2.5 MCG/ACT AERS Inhale 2 puffs into the lungs daily. (Patient not taking: Reported on 04/25/2019) 1 Inhaler 5   No current facility-administered medications for this encounter.    Physical Findings:  vitals were not taken for this visit.   /Unable to assess due to telephone follow up visit format.   Lab Findings: Lab Results  Component Value Date   WBC 5.7 03/06/2019   HGB 13.9 03/06/2019   HCT 43.1 03/06/2019   MCV 87.8 03/06/2019   PLT 379 03/06/2019     Radiographic Findings: No results found.  Impression/Plan: 1. 69 y.o.male with a new, 19m LLL nodule concerning for recurrence of non-small cell lung cancer. He appears to be recovering well from the effects of radiation.  We discussed that while we are happy to continue to participate in his clare if clinically indicated, at this point, we will plan to see him back on an as needed basis.  He will continue in routine follow up under the care and direction of Dr. MJulien Nordmann with plans for a follow-up CT chest scan prior to his next scheduled follow up visit with Dr. MJulien Nordmannon 08/07/2019, to assess his treatment response.  If this scan is stable,  they will continue with serial CT chest scans every 3 to 6 months for monitoring and management his systemic disease.  He also has a scheduled follow up with Dr. AElsworth Sohoin pulmonology next week on 06/04/19 and will further discuss treatment options for management of his COPD. He appears to have a good understanding of his overall disease and our recommendations and is comfortable and in agreement with the stated plan.  He knows to call at anytime with any questions or concerns.    ANicholos Johns PA-C

## 2019-06-04 ENCOUNTER — Other Ambulatory Visit: Payer: Self-pay

## 2019-06-04 ENCOUNTER — Ambulatory Visit (INDEPENDENT_AMBULATORY_CARE_PROVIDER_SITE_OTHER): Payer: Medicare PPO | Admitting: Pulmonary Disease

## 2019-06-04 ENCOUNTER — Encounter: Payer: Self-pay | Admitting: Pulmonary Disease

## 2019-06-04 VITALS — BP 126/84 | HR 97 | Temp 97.2°F | Ht 65.5 in | Wt 128.6 lb

## 2019-06-04 DIAGNOSIS — J42 Unspecified chronic bronchitis: Secondary | ICD-10-CM

## 2019-06-04 DIAGNOSIS — F17200 Nicotine dependence, unspecified, uncomplicated: Secondary | ICD-10-CM | POA: Diagnosis not present

## 2019-06-04 DIAGNOSIS — C3432 Malignant neoplasm of lower lobe, left bronchus or lung: Secondary | ICD-10-CM | POA: Diagnosis not present

## 2019-06-04 DIAGNOSIS — Z23 Encounter for immunization: Secondary | ICD-10-CM

## 2019-06-04 MED ORDER — TRELEGY ELLIPTA 100-62.5-25 MCG/INH IN AEPB: 1.0000 | INHALATION_SPRAY | Freq: Every day | RESPIRATORY_TRACT | 0 refills | Status: DC

## 2019-06-04 NOTE — Assessment & Plan Note (Signed)
Former cigarette smoker Current e-cigarette user  Plan: Emphasized need to stop e-cigarette use

## 2019-06-04 NOTE — Progress Notes (Signed)
@Patient  ID: Evan [name], male    DOB: December 19, 1950, 69 y.o.   MRN: [id number]  Chief Complaint  Patient presents with  . Follow-up    F/U on meds. States Anoro was $400 and was not given options. Wants to discuss neb treatments instead.     Referring provider: No ref. provider found  HPI:  69 year old male current e-cigarette user followed in our office for COPD  PMH: History of adenocarcinoma, left lower lobe lung cancer, current SBRT, hypertension, hyperlipidemia, depression Smoker/ Smoking History: Current Smoker - E cigarettes.  22.5-pack-year smoking history.  Using e-cigarettes 3-4 times a day. Maintenance: Anoro Ellipta Pt of: Dr. Elsworth Soho  06/04/2019  - Visit   69 year old male current e-cigarette user followed in our office for COPD.  Patient presenting to our office today as a follow-up from his March/2021 appointment.  Patient was started on Anoro Ellipta at that time.  Patient is status post SBRT.  He found significant relief when using Anoro Ellipta.  Unfortunately patient has a high deductible plan so struggling with the cost of the Anoro Ellipta.  It was estimated to be around $400.  He is unsure of his other options.  We will discuss this today.  Patient has not been able to use his Anoro Ellipta for the last month due to the high cost.  Walked today in office without any oxygen desaturations.  Questionaires / Pulmonary Flowsheets:   MMRC: mMRC Dyspnea Scale mMRC Score  06/04/2019 2    Tests:   03/06/2019-CT chest with contrast-interval increase in size and fullness in paramedian left upper lobe mass/prevascular anterior mediastinal soft tissue, concerning for local recurrence of disease, FDG PET may be useful to assess for recurrent metabolic activity, new 6 mm spiculated nodule in the superior segment of left lower lobe concerning for meta Curnes primary malignancy or metastasis, unchanged spiculated nodule in the lateral segment of right middle lobe and subpleural  nodule of the medial right lower lobe, likewise concerning for metastatic or meta Curnes disease, emphysema  03/30/2019-PET scan-interval development of new hypermetabolic 6 mm nodule in the superior segment of left lower lobe, imaging features highly concerning for metastatic disease, medic Kronos primary considered less likely, interval decrease in size and hypermetabolic him of the posterior right middle lobe pulmonary nodule identified previously, 7 mm nodule seen previously in left apex has resolved, 10 mm nodule in the anterior left upper lobe adjacent to the post treatment scarring is no longer evident and may be incorporated into the area of scarring, no evidence of hypermetabolic metastatic disease in the neck abdomen or pelvis  05/28/2015-spirometry-FVC 1.9 (57% predicted), ratio 79, FEV1 1.5 (56% predicted)  FENO:  No results found for: NITRICOXIDE  PFT: No flowsheet data found.  WALK:  SIX MIN WALK 06/04/2019  Supplimental Oxygen during Test? (L/min) No  Tech Comments: Patient was able to complete 1 lap at a steady pace. Denied any SOB or chest pain during walk.    Imaging: No results found.  Lab Results:  CBC    Component Value Date/Time   WBC 5.7 03/06/2019 1350   WBC 5.5 02/07/2017 1315   RBC 4.91 03/06/2019 1350   HGB 13.9 03/06/2019 1350   HGB 14.6 07/16/2016 1220   HCT 43.1 03/06/2019 1350   HCT 44.7 07/16/2016 1220   PLT 379 03/06/2019 1350   PLT 367 07/16/2016 1220   MCV 87.8 03/06/2019 1350   MCV 89.0 07/16/2016 1220   MCH 28.3 03/06/2019 1350   MCHC  32.3 03/06/2019 1350   RDW 13.9 03/06/2019 1350   RDW 14.3 07/16/2016 1220   LYMPHSABS 1.2 03/06/2019 1350   LYMPHSABS 1.0 07/16/2016 1220   MONOABS 0.5 03/06/2019 1350   MONOABS 0.3 07/16/2016 1220   EOSABS 0.6 (H) 03/06/2019 1350   EOSABS 0.4 07/16/2016 1220   BASOSABS 0.1 03/06/2019 1350   BASOSABS 0.1 07/16/2016 1220    BMET    Component Value Date/Time   NA 138 03/06/2019 1350   NA 141  07/16/2016 1220   K 3.8 03/06/2019 1350   K 4.0 07/16/2016 1220   CL 107 03/06/2019 1350   CL 104 07/19/2012 1432   CO2 22 03/06/2019 1350   CO2 24 07/16/2016 1220   GLUCOSE 92 03/06/2019 1350   GLUCOSE 89 07/16/2016 1220   GLUCOSE 97 07/19/2012 1432   BUN 16 03/06/2019 1350   BUN 8.8 07/16/2016 1220   CREATININE 0.93 03/06/2019 1350   CREATININE 1.0 07/16/2016 1220   CALCIUM 8.7 (L) 03/06/2019 1350   CALCIUM 9.3 07/16/2016 1220   GFRNONAA >60 03/06/2019 1350   GFRAA >60 03/06/2019 1350    BNP No results found for: BNP  ProBNP No results found for: PROBNP  Specialty Problems      Pulmonary Problems   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    Qualifier: Diagnosis of  By: Chase Caller MD, Murali        COPD (chronic obstructive pulmonary disease) ([place])    Qualifier: Diagnosis of  By: Chase Caller MD, Murali        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 Influenza Whole 03/25/2009  . Influenza,inj,Quad PF,6+ Mos 12/16/2014, 10/02/2015  . PFIZER SARS-COV-2 Vaccination 04/30/2019, 05/23/2019  . Pneumococcal Polysaccharide-23 03/25/2009, 06/04/2019  . Tdap 05/21/2012    Past Medical History:  Diagnosis Date  . Anxiety   . Complication of anesthesia    " sometimes I wake up during surgery "  . Depression   . Left renal mass 07/19/2016  . Lung cancer ([place]) dx'd 2010    NSCL CA right  . Lung cancer ([place]) dx'd 2017   left    Tobacco History: Social History   Tobacco Use  Smoking Status Former Smoker  . Packs/day: 0.50  . Years: 45.00  . Pack years: 22.50  . Types: Cigarettes  . Quit date: 04/02/2019  . Years since quitting: 0.1  Smokeless Tobacco Never Used   Counseling given: Yes   Continue to  not smoke  Outpatient Encounter Medications as of 06/04/2019  Medication Sig  . acetaminophen (TYLENOL) 500 MG tablet Take by mouth.  [name] albuterol (PROVENTIL HFA;VENTOLIN HFA) 108 (90 BASE) MCG/ACT inhaler Inhale 2 puffs into the lungs every 6 (six) hours as needed for wheezing or shortness of breath.  [name] albuterol (VENTOLIN HFA) 108 (90 Base) MCG/ACT inhaler Inhale 2 puffs into the lungs every 6 (six) hours as needed for wheezing or shortness of breath.  . [facility] ASPIRIN EC 325 MG EC tablet TAKE 1 TABLET BY MOUTH EVERY DAY  . ibuprofen (ADVIL,MOTRIN) 600 MG tablet Take 600 mg by mouth every 6 (six) hours as needed.  . Multiple Vitamin (MULTIVITAMIN WITH MINERALS) TABS tablet Take 1  tablet by mouth daily.  . Nutritional Supplements (CARNATION BREAKFAST ESSENTIALS PO) Take 1 packet by mouth daily.   . Fluticasone-Umeclidin-Vilant (TRELEGY ELLIPTA) 100-62.5-25 MCG/INH AEPB Inhale 1 puff into the lungs daily.  . [DISCONTINUED] predniSONE (DELTASONE) 10 MG tablet Take 4 tabs  daily with food x 4 days, then 3 tabs daily x 4 days, then 2 tabs daily x 4 days, then 1 tab daily x4 days then STOP. (Patient not taking: Reported on 05/30/2019)  . [DISCONTINUED] Tiotropium Bromide-Olodaterol (STIOLTO RESPIMAT) 2.5-2.5 MCG/ACT AERS Inhale 2 puffs into the lungs daily. (Patient not taking: Reported on 04/25/2019)  . [DISCONTINUED] umeclidinium-vilanterol (ANORO ELLIPTA) 62.5-25 MCG/INH AEPB Inhale 1 puff into the lungs daily.   No facility-administered encounter medications on file as of 06/04/2019.     Review of Systems  Review of Systems  Constitutional: Positive for fatigue. Negative for activity change, chills, fever and unexpected weight change.  HENT: Negative for postnasal drip, rhinorrhea, sinus pressure, sinus pain and sore throat.   Eyes: Negative.   Respiratory: Positive for shortness of breath. Negative for cough and wheezing.   Cardiovascular: Negative for chest pain and palpitations.    Gastrointestinal: Negative for constipation, diarrhea, nausea and vomiting.  Endocrine: Negative.   Genitourinary: Negative.   Musculoskeletal: Negative.   Skin: Negative.   Neurological: Negative for dizziness and headaches.  Psychiatric/Behavioral: Negative.  Negative for dysphoric mood. The patient is not nervous/anxious.   All other systems reviewed and are negative.    Physical Exam  BP 126/84   Pulse 97   Temp (!) 97.2 F (36.2 C) (Temporal)   Ht 5' 5.5" (1.664 m)   Wt 128 lb 9.6 oz (58.3 kg)   SpO2 96% Comment: on RA  BMI 21.07 kg/m   Wt Readings from Last 5 Encounters:  06/04/19 128 lb 9.6 oz (58.3 kg)  04/25/19 132 lb 9.6 oz (60.1 kg)  04/03/19 132 lb 9.6 oz (60.1 kg)  03/20/19 133 lb 14.4 oz (60.7 kg)  09/27/18 134 lb 4.8 oz (60.9 kg)    BMI Readings from Last 5 Encounters:  06/04/19 21.07 kg/m  04/25/19 21.73 kg/m  04/03/19 22.07 kg/m  03/20/19 22.28 kg/m  09/27/18 22.35 kg/m     Physical Exam Vitals and nursing note reviewed.  Constitutional:      General: He is not in acute distress.    Appearance: Normal appearance. He is normal weight.  HENT:     Head: Normocephalic and atraumatic.     Right Ear: Hearing, tympanic membrane, ear canal and external ear normal. There is no impacted cerumen.     Left Ear: Hearing, tympanic membrane, ear canal and external ear normal. There is no impacted cerumen.     Nose: Nose normal. No mucosal edema or rhinorrhea.     Right Turbinates: Not enlarged.     Left Turbinates: Not enlarged.     Mouth/Throat:     Mouth: Mucous membranes are dry.     Pharynx: Oropharynx is clear. No oropharyngeal exudate.  Eyes:     Pupils: Pupils are equal, round, and reactive to light.  Cardiovascular:     Rate and Rhythm: Normal rate and regular rhythm.     Pulses: Normal pulses.     Heart sounds: Normal heart sounds. No murmur.  Pulmonary:     Effort: Pulmonary effort is normal.     Breath sounds: No decreased breath  sounds, wheezing or rales.     Comments: Diminished breath sounds Musculoskeletal:  Cervical back: Normal range of motion.     Right lower leg: No edema.     Left lower leg: No edema.  Lymphadenopathy:     Cervical: No cervical adenopathy.  Skin:    General: Skin is warm and dry.     Capillary Refill: Capillary refill takes less than 2 seconds.     Findings: No erythema or rash.  Neurological:     General: No focal deficit present.     Mental Status: He is alert and oriented to person, place, and time.     Motor: No weakness.     Coordination: Coordination normal.     Gait: Gait is intact. Gait normal.  Psychiatric:        Mood and Affect: Mood normal.        Behavior: Behavior normal. Behavior is cooperative.        Thought Content: Thought content normal.        Judgment: Judgment normal.       Assessment & Plan:   Primary cancer of left lower lobe of lung ([place]) Plan: Continue follow-up with radiation oncology  COPD (chronic obstructive pulmonary disease) Plan: Trial of [place] team appointment scheduled for later on this week for help with inhaler cost Emphasized need to stop smoking using e-cigarettes Pulmonary function testing ordered  TOBACCO ABUSE Former cigarette smoker Current e-cigarette user  Plan: Emphasized need to stop e-cigarette use    Return in about 4 weeks (around 07/02/2019), or if symptoms worsen or fail to improve, for Follow up with Dr. Elsworth Soho, Follow up with Wyn Quaker FNP-C.   Lauraine Rinne, NP 06/04/2019   This appointment required 32 minutes of patient care (this includes precharting, chart review, review of results, face-to-face care, etc.).

## 2019-06-04 NOTE — Assessment & Plan Note (Signed)
Plan: Trial of [place] team appointment scheduled for later on this week for help with inhaler cost Emphasized need to stop smoking using e-cigarettes Pulmonary function testing ordered

## 2019-06-04 NOTE — Assessment & Plan Note (Signed)
Plan: Continue follow-up with radiation oncology

## 2019-06-04 NOTE — Patient Instructions (Addendum)
You were seen today by Lauraine Rinne, NP  for:   1. Chronic bronchitis, unspecified chronic bronchitis type (HCC)  Trial of Trelegy Ellipta  >>> 1 puff daily in the morning >>>rinse mouth out after use  >>> This inhaler contains 3 medications that help manage her respiratory status, contact our office if you cannot afford this medication or unable to remain on this medication  Note your daily symptoms > remember "red flags" for COPD:   >>>Increase in cough >>>increase in sputum production  >>>increase in shortness of breath or activity  intolerance.   If you notice these symptoms, please call the office to be seen.   Please present to our office in 1 weeks for an appointment with the clinical pharmacy team for:  . Smoking cessation . Medication Management  . Medication reconciliation  . Medication Access - ellipta  . Inhaler teaching    2. Primary cancer of left lower lobe of lung ([place])  Continue follow-up with radiation oncology  3. TOBACCO ABUSE  We recommend that you stop smoking.  >>>You need to set a quit date >>>If you have friends or family who smoke, let them know you are trying to quit and not to smoke around you or in your living environment  Smoking Cessation Resources:  1 800 QUIT NOW  >>> Patient to call this resource and utilize it to help support her quit smoking >>> Keep up your hard work with stopping smoking  You can also contact the [facility] >>>For smoking cessation classes call [phone]  We do not recommend using e-cigarettes as a form of stopping smoking  You can sign up for smoking cessation support texts and information:  >>>https://smokefree.gov/smokefreetxt     We recommend today:   Meds ordered this encounter  Medications  . Fluticasone-Umeclidin-Vilant (TRELEGY ELLIPTA) 100-62.5-25 MCG/INH AEPB    Sig: Inhale 1 puff into the lungs daily.    Dispense:  2 each    Refill:  0    Order Specific Question:   Lot Number?     Answer:   [id number]    Order Specific Question:   Expiration Date?    Answer:   08/31/2020    Order Specific Question:   Manufacturer?    Answer:   GlaxoSmithKline [[id number]]    Follow Up:    Return in about 4 weeks (around 07/02/2019), or if symptoms worsen or fail to improve, for Follow up with Dr. Elsworth Soho, Follow up with Wyn Quaker FNP-C.  Please present to our office in 1 weeks for an appointment with the clinical pharmacy team for:  . Smoking cessation . Medication Management  . Medication reconciliation  . Medication Access - ellipta  . Inhaler teaching     Please do your part to reduce the spread of COVID-19:      Reduce your risk of any infection  and COVID19 by using the similar precautions used for avoiding the common cold or flu:  [name] Wash your hands often with soap and warm water for at least 20 seconds.  If soap and water are not readily available, use an alcohol-based hand sanitizer with at least 60% alcohol.  . If coughing or sneezing, cover your mouth and nose by coughing or sneezing into the elbow areas of your shirt or coat, into a tissue or into your sleeve (not your hands). Langley Gauss A MASK when in public  . Avoid shaking hands with others and consider head nods or verbal greetings only. [name]  Avoid touching your eyes, nose, or mouth with unwashed hands.  . Avoid close contact with people who are sick. . Avoid places or events with large numbers of people in one location, like concerts or sporting events. . If you have some symptoms but not all symptoms, continue to monitor at home and seek medical attention if your symptoms worsen. . If you are having a medical emergency, call 911.   [place] / e-Visit: [email or web address]         [facility]: [facility]: [phone]                   [facility]: [phone]      It is flu season:   >>> Best ways to protect herself from the flu: Receive the yearly flu vaccine, practice good hand hygiene washing with soap and also using hand sanitizer when available, eat a nutritious meals, get adequate rest, hydrate appropriately   Please contact the office if your symptoms worsen or you have concerns that you are not improving.   Thank you for choosing [facility] for your healthcare, and for allowing [place] to partner with you on your healthcare journey. I am thankful to be able to provide care to you today.   Wyn Quaker FNP-C

## 2019-06-07 ENCOUNTER — Other Ambulatory Visit: Payer: Medicare PPO

## 2019-06-07 ENCOUNTER — Telehealth: Payer: Self-pay | Admitting: Pharmacist

## 2019-06-07 NOTE — Telephone Encounter (Signed)
Application placed in mail for patient.

## 2019-06-07 NOTE — Telephone Encounter (Signed)
Call patient to reschedule appointment as I unexpectedly will be out of the office today.  Patient was scheduled for inhaler education and smoking cessation.  He is having difficulty affording his Anoro inhaler.  He was started on Trelegy sample at last appointment.  Patient states he has [employer].  His co-pay is high due to a deductible.  There are no grants available for COPD.  Patient will need to proceed with patient assistance.  Patient states he has a household of 1 and annual income around 16,000 a year.  Trelegy manufacture, [place], requires $600 out-of-pocket spending on prescriptions for approval.  Patient states he has not spent $600 out-of-pocket this year.  Patient would not qualify for patient assistance program.  Discussed Breztri inhaler as another option.  He meets the income requirements and they do not have out-of-pocket spending requirement.  Patient is willing to try [facility] inhaler.  Mailed patient AZ& me application to complete and return along with proof of income at his next appointment.  Patient has enough Trelegy for 1 month.  Pharmacy schedule is booked until the end of May.  Patient scheduled at the earliest available appointment on 06/26/2019 at 2:20 p.m.  All questions encouraged and answered.  Instructed patient to call with any further questions or concerns.  Mariella Saa, PharmD, [place], [place] Clinical Specialty Pharmacist [phone]  06/07/2019 9:51 AM

## 2019-06-24 NOTE — Progress Notes (Signed)
Subjective Patient presents to [facility] and seen by the pharmacist for smoking cessation counseling and inhaler education.  At last office visit on 07/01/2019 with Wyn Quaker, NP patient was given Trelegy samples to trial as Anoro Ellipta was unaffordable.  Patient states he has seen minimal improvement in his breathing and has had an increase in mouth soreness/sores.  He was not rinsing his mouth after use.  He states Anoro worked better [employer].  He is currently smoking 5 e-cigarettes daily.  He currently smokes in the house but does not smoke in his car.  Social History   Tobacco Use  Smoking Status Former Smoker  . Packs/day: 0.50  . Years: 45.00  . Pack years: 22.50  . Types: Cigarettes  . Quit date: 04/02/2019  . Years since quitting: 0.2  Smokeless Tobacco Never Used     Tobacco Use History  Age when started using tobacco on a daily basis 8.  Type: cigarettes and electronic cigarettes.  Number of cigarettes per day 5  Smokes first cigarette 10-30 minutes after waking.  Does not wake at night to smoke  Triggers include stress.  Quit Attempt History   Most recent quit attempt this past week.  Longest time ever been tobacco free 2 years.  Methods tried in the past include nicotine patch, gum, lozenges, Chantix (nightmares and mood).   Motivators to quitting include save money; barriers include stress    Immunization History  Administered Date(s) Administered  . Influenza Whole 03/25/2009  . Influenza,inj,Quad PF,6+ Mos 12/16/2014, 10/02/2015  . PFIZER SARS-COV-2 Vaccination 04/30/2019, 05/23/2019  . Pneumococcal Polysaccharide-23 03/25/2009, 06/04/2019  . Tdap 05/21/2012     Assessment and Plan  1. Inhaler Education  Patient has tried Comptroller.  He states Anoro Ellipta helped better control his symptoms.  Discussed patient assistance via the phone earlier this month and mailed application. Patient returned completed  application along with income documents.  Patient would like to try an inhaler without a steroid.  He agreed to a trial Bevespi.  Patient was counseled on the purpose, proper use, and adverse effects of Bevespi inhaler.   Reviewed appropriate use of maintenance vs rescue inhalers.  Stressed importance of using maintenance inhaler daily and rescue inhaler only as needed.  Patient verbalized understanding.  Demonstrated proper inhaler technique using Bevespi demo inhaler.  Patient able to demonstrate proper inhaler technique using teach back method.   Patient was given sample in office today of Breztri as we do not have Bevespi in stock.  He was offered another LABA/LAMA combination inhaler but chose to trial Breztri. Instructed patient to rinse mouth out with water after each use to minimize mouth irritation.  Patient verbalized understanding.  Sample: Judithann Sauger OEU:[id number] Exp: [id number]  Will proceed with patient assistance for Bevespi.  Application placed in provider box to sign and fax for approval.  Instructed patient to call AZ&Me for any status updates.  Patient verbalized understanding.  2. Smoking Cessation   Patient is not ready to quit smoking but thinks he will be in a few weeks.  Reviewed STAR quit plan and smoking cessation agents Chantix, Wellbutrin, and nicotine replacement therapy.  Schedule follow up appointment for 07/16/19 to discuss setting a quit date.  Set goal to stop smoking in the house by next appointment.  3. Medication Reconciliation A drug regimen assessment was performed, including review of allergies, interactions, disease-state management, dosing and immunization history. Medications were reviewed with the patient, including name, instructions, indication,  goals of therapy, potential side effects, importance of adherence, and safe use.  4. Immunizations  Patient is up to date with Pneumovax 23 and Covid-19 vaccines.  All questions encouraged and answered.  Instructed patient to call with any further questions or concerns.  Thank you for allowing pharmacy to participate in this patient's care.  This appointment required 65 minutes of patient care (this includes precharting, chart review, review of results, face-to-face care, etc.).  Mariella Saa, PharmD, [place], CPP Clinical Specialty Pharmacist (Rheumatology and Pulmonology)  06/26/2019 4:40 PM

## 2019-06-26 ENCOUNTER — Other Ambulatory Visit: Payer: Self-pay

## 2019-06-26 ENCOUNTER — Ambulatory Visit (INDEPENDENT_AMBULATORY_CARE_PROVIDER_SITE_OTHER): Payer: Medicare PPO | Admitting: Pharmacist

## 2019-06-26 DIAGNOSIS — J42 Unspecified chronic bronchitis: Secondary | ICD-10-CM

## 2019-06-26 MED ORDER — BEVESPI AEROSPHERE 9-4.8 MCG/ACT IN AERO: 2.0000 | INHALATION_SPRAY | Freq: Two times a day (BID) | RESPIRATORY_TRACT | 11 refills | Status: DC

## 2019-06-26 MED ORDER — BREZTRI AEROSPHERE 160-9-4.8 MCG/ACT IN AERO: 2.0000 | INHALATION_SPRAY | Freq: Two times a day (BID) | RESPIRATORY_TRACT | 0 refills | Status: DC

## 2019-06-26 NOTE — Patient Instructions (Addendum)
   STOP Trelegy  START Breztri 2 puffs twice a day.  Rinse mouth after use.  We will apply for patient assistance through AZ&Me for Bevepsi as it does not have a steroid.  Call AZ&Me at [phone] for updates on application status.  STOP smoking in the house.  Review STAR Quit plan and create personal plan to review at next appointment on 6/14 at 2:20pm.

## 2019-07-15 NOTE — Progress Notes (Signed)
Subjective Patient presents to [facility] and seen by the pharmacist for smoking cessation counseling. Patient was referred by Wyn Quaker, NP. 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 At last appt, patient stated to Dr. Luetta Nutting Yopp he was smoking 5 e-cigarettes daily. He was smoking in his house but not his car.   Currently, patient reports he is experiencing swelling again in his mouth and his teeth are tender. He thinks there is probably pus in his mouth again. He is currently using Breztri right now. Reports rinsing mouth out. He completed Bevespi PAP form via AZ&me. He is unsure if he has received a call from AZ&me considering he gets multiple scam calls daily. He smokes 0.5 pack of cigarettes currently. He uses [employer] and ecigs. Patient's tobacco use is dependent on stress. Also, patient states his chemo affects how tobacco tastes which helps decrease tobacco use.  Benefits Investigation (30 day supply) Chantix - $9.20 Bupropion - $3.70 OTC nicotine therapy not covered   Patient specific reminders: daughter is going to [place] with her bf (he think she will be getting engaged)  Social History   Tobacco Use  Smoking Status Former Smoker  . Packs/day: 0.50  . Years: 45.00  . Pack years: 22.50  . Types: Cigarettes  . Quit date: 04/02/2019  . Years since quitting: 0.2  Smokeless Tobacco Never Used     Tobacco Use History  Age when started using tobacco on a daily basis 53.  Type: cigarettes and electronic cigarettes.  Number of cigarettes per day 5  Smokes first cigarette 10-30 minutes after waking.  Does not wake at night to smoke  Triggers include stress.  Quit Attempt History   Most recent quit attempt this past week.  Longest time ever been  tobacco free 2 years.  Methods tried in the past include nicotine patch, gum, lozenges, Chantix (nightmares and mood).   Motivators to quitting include save money; barriers include stress    Immunization History  Administered Date(s) Administered  . Influenza Whole 03/25/2009  . Influenza,inj,Quad PF,6+ Mos 12/16/2014, 10/02/2015  . PFIZER SARS-COV-2 Vaccination 04/30/2019, 05/23/2019  . Pneumococcal Polysaccharide-23 03/25/2009, 06/04/2019  . Tdap 05/21/2012     Assessment and Plan  1. Smoking Cessation   Patient states they are ready to quit smoking, but is not ready to set a date. He would prefer to start using smoking cessation agents and see how it goes.  Discussed smoking cessation agent Chant, Wellbutrin, and nicotine replacement therapies.  Patient is agreeable to bupropion, nicotine patch, and nicotine gum. He will obtain bupropion via pharmacy and nicotine patch/gum via [employer]uit[employer].   Nicotine Patch 14 mg daily Patient counseled on purpose, proper use, and potential adverse effects, including mild itching or redness at the point of application, headache, trouble sleeping, and/or vivid dreams     Patch Schedule for <10 cigarettes daily Weeks 1 to 6: one nicotine patch (14 mg) daily. I will call and re-assess how you are doing at the end of 6 weeks to see how you are doing on 14 mg patch and if you are ready to decrease dose of patch. Weeks 7-8: one nicotine patch (7 mg) daily  Nicotine Gum 4 mg daily Patient counseled on purpose, proper use, and potential adverse effects including jaw soreness and  upset stomach if swallowing saliva.  Instructed patient to use 4 mg if they smoke a pack a day or more and 2 mg if they smoke less than a pack a day.  Gum dosing schedule Weeks 1 to 6: Chew 1 piece of gum every 1 to 2 hours (maximum: 24 pieces/day); to increase chances of quitting, chew at least 9 pieces/day during the first 6 weeks Weeks 7 to 9: Chew 1 piece of gum every 2 to 4  hours (maximum: 24 pieces/day) Weeks 10 to 12: Chew 1 piece of gum every 4 to 8 hours (maximum: 24 pieces/day)  Bupropion Initiated bupropion XL 150 mg (1 tablet) daily for 7 days then INCREASE to bupropion XL 300 mg (2 tablets) daily. Patient with no PMH of seizures. Patient counseled on purpose, proper use, and potential adverse effects, including insomnia, and potential change in mood.   Provided information on 1 800-QUIT NOW support program.   2. Medication Reconciliation A drug regimen assessment was performed, including review of allergies, interactions, disease-state management, dosing and immunization history. Medications were reviewed with the patient, including name, instructions, indication, goals of therapy, potential side effects, importance of adherence, and safe use. No concerning interactions were identified.  3. Immunizations Patient is indicated for the shingles vaccination. Will address at follow up.  Plan 1. Patient will start using bupropion, nicotine patch, and nicotine gum. He will call pharmacy if he has issues tolerating. 2. Follow up with patient via telephone on 08/20/2019 at 2:20 PM 3. Address shingles vaccine at follow up 4. Advised patient to contact AZ&me regarding Bevespi approval  This appointment required 65 minutes of patient care (this includes precharting, chart review, review of results, face-to-face care, etc.).  Thank you for involving pharmacy to assist in providing this patient's care.   Drexel Iha, PharmD PGY2 Ambulatory Care Pharmacy Resident

## 2019-07-16 ENCOUNTER — Ambulatory Visit (INDEPENDENT_AMBULATORY_CARE_PROVIDER_SITE_OTHER): Payer: Medicare PPO | Admitting: Pharmacist

## 2019-07-16 ENCOUNTER — Other Ambulatory Visit: Payer: Self-pay

## 2019-07-16 DIAGNOSIS — F17200 Nicotine dependence, unspecified, uncomplicated: Secondary | ICD-10-CM

## 2019-07-16 MED ORDER — NICOTINE 14 MG/24HR TD PT24: 14.0000 mg | MEDICATED_PATCH | Freq: Every day | TRANSDERMAL | 0 refills | Status: DC

## 2019-07-16 MED ORDER — NICOTINE POLACRILEX 4 MG MT GUM: 4.0000 mg | CHEWING_GUM | OROMUCOSAL | 0 refills | Status: DC | PRN

## 2019-07-16 MED ORDER — BUPROPION HCL ER (XL) 150 MG PO TB24: 150.0000 mg | ORAL_TABLET | ORAL | 11 refills | Status: DC

## 2019-07-16 NOTE — Patient Instructions (Addendum)
It was a pleasure seeing you in clinic today!  Today the plan is... 1. Please call 1-800-AZandMe ([phone]) to discuss approval of Bevespi inhaler  2. Nicotine patch 14 mg daily  Patch Schedule for <10 cigarettes daily Weeks 1 to 6:one nicotine patch (14 mg) daily.I will call and re-assess how you are doing at the end of 6 weeks to see how you are doing on 14 mg patch and if you are ready to decrease dose of patch. Weeks 7-8: one nicotine patch (7 mg) daily 3. Nicotine 4 mg gum  Gum dosing schedule Weeks 1 to 6: Chew 1 piece of gum every 1 to 2 hours (maximum: 24 pieces/day); to increase chances of quitting, chew at least 9 pieces/day during the first 6 weeks Weeks 7 to 9: Chew 1 piece of gum every 2 to 4 hours (maximum: 24 pieces/day) Weeks 10 to 12: Chew 1 piece of gum every 4 to 8 hours (maximum: 24 pieces/day) 4. Bupropion Take 1 tablet (150 mg) daily for 7 days then increase to 2 tablets afterwards (300 mg) daily. Remember to take in the morning  Please call the PharmD clinic at [phone] if you have any questions that you would like to speak with a pharmacist about Evan [name], Museum/gallery conservator).

## 2019-07-18 NOTE — Telephone Encounter (Signed)
Received application, RA signed and paperwork was faxed to [employer] [phone].

## 2019-07-25 ENCOUNTER — Other Ambulatory Visit: Payer: Self-pay | Admitting: Pharmacist

## 2019-07-25 ENCOUNTER — Telehealth: Payer: Self-pay | Admitting: Pulmonary Disease

## 2019-07-25 DIAGNOSIS — F17200 Nicotine dependence, unspecified, uncomplicated: Secondary | ICD-10-CM

## 2019-07-25 MED ORDER — NICOTINE 14 MG/24HR TD PT24: 14.0000 mg | MEDICATED_PATCH | Freq: Every day | TRANSDERMAL | 1 refills | Status: DC

## 2019-07-25 MED ORDER — NICOTINE POLACRILEX 4 MG MT GUM: 4.0000 mg | CHEWING_GUM | OROMUCOSAL | 0 refills | Status: DC | PRN

## 2019-07-25 NOTE — Progress Notes (Signed)
Patient called and stated [facility] did not receive prescription for patch and gum.  He was able to pick up Wellbutrin and rescue inhaler without issue.  He states he started Wellbutrin last week and is about to increase to 2 tablets daily.  He denies any side effects.  Prescription sent to [facility].  Instructed patient to call with any further questions or concerns.  Mariella Saa, PharmD, [place], CPP Clinical Specialty Pharmacist (Rheumatology and Pulmonology)  07/25/2019 3:29 PM

## 2019-07-31 NOTE — Telephone Encounter (Signed)
Patient called but unable to speak at this time, requesting a call back.

## 2019-08-01 NOTE — Telephone Encounter (Addendum)
ATC pt, there was no answer and his VM is not set up. Will try back.

## 2019-08-02 NOTE — Telephone Encounter (Signed)
Pt returning call.  [phone]

## 2019-08-02 NOTE — Telephone Encounter (Signed)
Patient has already obtained gum and patches and he is trying to quit smoking.

## 2019-08-02 NOTE — Telephone Encounter (Signed)
ATC pt, there was no answer and his VM is not set up. Will try back.

## 2019-08-03 ENCOUNTER — Encounter (HOSPITAL_COMMUNITY): Payer: Self-pay

## 2019-08-03 ENCOUNTER — Ambulatory Visit (HOSPITAL_COMMUNITY)
Admission: RE | Admit: 2019-08-03 | Discharge: 2019-08-03 | Disposition: A | Discharge status: Home / Self Care | Payer: Medicare PPO | Source: Ambulatory Visit | Attending: Internal Medicine | Admitting: Internal Medicine

## 2019-08-03 ENCOUNTER — Other Ambulatory Visit: Payer: Self-pay

## 2019-08-03 ENCOUNTER — Inpatient Hospital Stay: Payer: Medicare PPO | Attending: Internal Medicine

## 2019-08-03 DIAGNOSIS — R911 Solitary pulmonary nodule: Secondary | ICD-10-CM | POA: Diagnosis not present

## 2019-08-03 DIAGNOSIS — R05 Cough: Secondary | ICD-10-CM | POA: Diagnosis not present

## 2019-08-03 DIAGNOSIS — C3490 Malignant neoplasm of unspecified part of unspecified bronchus or lung: Secondary | ICD-10-CM | POA: Diagnosis present

## 2019-08-03 DIAGNOSIS — J449 Chronic obstructive pulmonary disease, unspecified: Secondary | ICD-10-CM | POA: Diagnosis not present

## 2019-08-03 DIAGNOSIS — C3492 Malignant neoplasm of unspecified part of left bronchus or lung: Secondary | ICD-10-CM | POA: Insufficient documentation

## 2019-08-03 DIAGNOSIS — R0609 Other forms of dyspnea: Secondary | ICD-10-CM | POA: Diagnosis not present

## 2019-08-03 DIAGNOSIS — Z85118 Personal history of other malignant neoplasm of bronchus and lung: Secondary | ICD-10-CM | POA: Insufficient documentation

## 2019-08-03 LAB — CBC WITH DIFFERENTIAL (CANCER CENTER ONLY)
Abs Immature Granulocytes: 0.01 10*3/uL (ref 0.00–0.07)
Basophils Absolute: 0.1 10*3/uL (ref 0.0–0.1)
Basophils Relative: 2 %
Eosinophils Absolute: 1.1 10*3/uL — ABNORMAL HIGH (ref 0.0–0.5)
Eosinophils Relative: 19 %
HCT: 45.6 % (ref 39.0–52.0)
Hemoglobin: 14.8 g/dL (ref 13.0–17.0)
Immature Granulocytes: 0 %
Lymphocytes Relative: 21 %
Lymphs Abs: 1.2 10*3/uL (ref 0.7–4.0)
MCH: 28.2 pg (ref 26.0–34.0)
MCHC: 32.5 g/dL (ref 30.0–36.0)
MCV: 87 fL (ref 80.0–100.0)
Monocytes Absolute: 0.5 10*3/uL (ref 0.1–1.0)
Monocytes Relative: 9 %
Neutro Abs: 2.9 10*3/uL (ref 1.7–7.7)
Neutrophils Relative %: 49 %
Platelet Count: 413 10*3/uL — ABNORMAL HIGH (ref 150–400)
RBC: 5.24 MIL/uL (ref 4.22–5.81)
RDW: 14.3 % (ref 11.5–15.5)
WBC Count: 5.8 10*3/uL (ref 4.0–10.5)
nRBC: 0 % (ref 0.0–0.2)

## 2019-08-03 LAB — CMP (CANCER CENTER ONLY)
ALT: 16 U/L (ref 0–44)
AST: 17 U/L (ref 15–41)
Albumin: 3.7 g/dL (ref 3.5–5.0)
Alkaline Phosphatase: 119 U/L (ref 38–126)
Anion gap: 11 (ref 5–15)
BUN: 11 mg/dL (ref 8–23)
CO2: 21 mmol/L — ABNORMAL LOW (ref 22–32)
Calcium: 9.3 mg/dL (ref 8.9–10.3)
Chloride: 104 mmol/L (ref 98–111)
Creatinine: 1.06 mg/dL (ref 0.61–1.24)
GFR, Est AFR Am: >60 mL/min (ref >60)
GFR, Estimated: >60 mL/min (ref >60)
Glucose, Bld: 94 mg/dL (ref 70–99)
Potassium: 4.2 mmol/L (ref 3.5–5.1)
Sodium: 136 mmol/L (ref 135–145)
Total Bilirubin: <0.2 mg/dL — ABNORMAL LOW (ref 0.3–1.2)
Total Protein: 7.9 g/dL (ref 6.5–8.1)

## 2019-08-03 MED ORDER — IOHEXOL 300 MG/ML  SOLN
75.0000 mL | Freq: Once | INTRAMUSCULAR | Status: AC | PRN
  Administered 2019-08-03: 75 mL via INTRAVENOUS

## 2019-08-03 MED ORDER — SODIUM CHLORIDE (PF) 0.9 % IJ SOLN
INTRAMUSCULAR | Status: AC
  Filled 2019-08-03: qty 50

## 2019-08-07 ENCOUNTER — Encounter: Payer: Self-pay | Admitting: Internal Medicine

## 2019-08-07 ENCOUNTER — Other Ambulatory Visit: Payer: Self-pay

## 2019-08-07 ENCOUNTER — Inpatient Hospital Stay: Payer: Medicare PPO | Admitting: Internal Medicine

## 2019-08-07 ENCOUNTER — Other Ambulatory Visit: Payer: Self-pay | Admitting: Pulmonary Disease

## 2019-08-07 VITALS — BP 127/90 | HR 104 | Temp 97.5°F | Resp 18 | Ht 65.5 in | Wt 127.4 lb

## 2019-08-07 DIAGNOSIS — C3432 Malignant neoplasm of lower lobe, left bronchus or lung: Secondary | ICD-10-CM | POA: Diagnosis not present

## 2019-08-07 DIAGNOSIS — C3490 Malignant neoplasm of unspecified part of unspecified bronchus or lung: Secondary | ICD-10-CM | POA: Diagnosis not present

## 2019-08-07 DIAGNOSIS — F17200 Nicotine dependence, unspecified, uncomplicated: Secondary | ICD-10-CM

## 2019-08-07 DIAGNOSIS — I10 Essential (primary) hypertension: Secondary | ICD-10-CM

## 2019-08-07 DIAGNOSIS — C342 Malignant neoplasm of middle lobe, bronchus or lung: Secondary | ICD-10-CM

## 2019-08-07 DIAGNOSIS — C3412 Malignant neoplasm of upper lobe, left bronchus or lung: Secondary | ICD-10-CM | POA: Diagnosis not present

## 2019-08-07 DIAGNOSIS — C3492 Malignant neoplasm of unspecified part of left bronchus or lung: Secondary | ICD-10-CM | POA: Diagnosis not present

## 2019-08-07 NOTE — Progress Notes (Signed)
[place] [phone]) [phone]   [phone]  OFFICE PROGRESS NOTE  Default, Provider, MD No address on file  PRINCIPAL DIAGNOSIS: 1) stage IIIA (T2a, N2, M0) non-small cell lung cancer, squamous cell carcinoma of the left upper lobe diagnosed in August 2016 2)  Stage IIIA (T2 N2 Mx) non-small cell lung cancer, adenocarcinoma, involving the right upper lobe and mediastinal lymphadenopathy diagnosed in June 2010.   PRIOR THERAPY:  1. Status post concurrent chemoradiation with weekly carboplatin and paclitaxel; last dose was given April 08, 2008. 2. Status post 3 cycles of consolidation chemotherapy with docetaxel; last dose was given on Jun 26, 2008. 3. Concurrent chemoradiation with weekly carboplatin for AUC of 2 and paclitaxel 45 MG/M2. First dose expected on 09/23/2014 status post 7 cycles. Last dose was given 12/01/2014 with partial response. 4. Consolidation chemotherapy with carboplatin for AUC of 5 and paclitaxel 175 MG/M2 every 3 weeks with Neulasta support. First dose 01/02/2015. Status post 3 cycles.   5. Curative SBRT to right middle lobe lung nodule under the care of Dr. [name] completed on 07/24/2018   CURRENT THERAPY: Observation.  INTERVAL HISTORY: Evan [name] 69 y.o. male returns to the clinic today for follow-up visit accompanied by his daughter.  The patient is feeling fine today with no concerning complaints except for shortness of breath with exertion.  He denied having any current chest pain but has mild cough with no hemoptysis.  He denied having any recent weight loss or night sweats.  He has no nausea, vomiting, diarrhea or constipation.  He denied having any headache or visual changes.  He has no fever or chills.  The patient had repeat CT scan of the chest performed recently and he is here for evaluation and discussion of his discuss results.   MEDICAL HISTORY: Past Medical History:  Diagnosis Date  . Anxiety   . Complication  of anesthesia    " sometimes I wake up during surgery "  . Depression   . Left renal mass 07/19/2016  . Lung cancer ([place]) dx'd 2010    NSCL CA right  . Lung cancer ([place]) dx'd 2017   left    ALLERGIES:  has No Known Allergies.  MEDICATIONS:  Current Outpatient Medications  Medication Sig Dispense Refill  . acetaminophen (TYLENOL) 500 MG tablet Take by mouth.    [name] albuterol (VENTOLIN HFA) 108 (90 Base) MCG/ACT inhaler Inhale 2 puffs into the lungs every 6 (six) hours as needed for wheezing or shortness of breath. 18 g 3  . Budeson-Glycopyrrol-Formoterol (BREZTRI AEROSPHERE) 160-9-4.8 MCG/ACT AERO Inhale 2 puffs into the lungs 2 (two) times daily. 5.9 g 0  . buPROPion (WELLBUTRIN XL) 150 MG 24 hr tablet Take 1 tablet (150 mg total) by mouth as directed. Take 1 tablet (150 mg total) by mouth once daily for 7 days then INCREASE to 2 tablets (300 mg total) by mouth once daily afterwards 60 tablet 11  . [employer] ASPIRIN EC 325 MG EC tablet TAKE 1 TABLET BY MOUTH EVERY DAY 90 tablet 1  . Glycopyrrolate-Formoterol (BEVESPI AEROSPHERE) 9-4.8 MCG/ACT AERO Inhale 2 puffs into the lungs 2 (two) times daily. 10.7 g 11  . ibuprofen (ADVIL,MOTRIN) 600 MG tablet Take 600 mg by mouth every 6 (six) hours as needed.    . Multiple Vitamin (MULTIVITAMIN WITH MINERALS) TABS tablet Take 1 tablet by mouth daily.    . nicotine (NICODERM CQ - DOSED IN MG/24 HOURS) 14 mg/24hr patch Place 1  patch (14 mg total) onto the skin daily. For 6 weeks.  Then decrease to 7 mg daily. 28 patch 1  . nicotine polacrilex (NICORETTE) 4 MG gum Take 1 each (4 mg total) by mouth as needed for smoking cessation. 100 tablet 0  . Nutritional Supplements (CARNATION BREAKFAST ESSENTIALS PO) Take 1 packet by mouth daily.  (Patient not taking: Reported on 07/16/2019)     No current facility-administered medications for this visit.    SURGICAL HISTORY:  Past Surgical History:  Procedure Laterality Date  . AMPUTATION Right 05/21/2012   Procedure:  AMPUTATION BELOW KNEE;  Surgeon: Newt Minion, MD;  Location: [place];  Service: Orthopedics;  Laterality: Right;  . AMPUTATION Right 06/21/2012   Procedure: Revision AMPUTATION BELOW KNEE Right;  Surgeon: Newt Minion, MD;  Location: [place];  Service: Orthopedics;  Laterality: Right;  Revision right Below Knee Amputation  . COLONOSCOPY W/ BIOPSIES AND POLYPECTOMY    . FOOT AMPUTATION Right    traumatic right lower extremity  . UPPER JAW     SURGERY FOR INFECTION    REVIEW OF SYSTEMS:  Constitutional: positive for fatigue Eyes: negative Ears, nose, mouth, throat, and face: negative Respiratory: positive for cough and dyspnea on exertion Cardiovascular: negative Gastrointestinal: negative Genitourinary:negative Integument/breast: negative Hematologic/lymphatic: negative Musculoskeletal:negative Neurological: negative Behavioral/Psych: negative Endocrine: negative Allergic/Immunologic: negative   PHYSICAL EXAMINATION: General appearance: alert, cooperative, fatigued and no distress Head: Normocephalic, without obvious abnormality, atraumatic Neck: no adenopathy Lymph nodes: Cervical, supraclavicular, and axillary nodes normal. Resp: clear to auscultation bilaterally Back: symmetric, no curvature. ROM normal. No CVA tenderness. Cardio: regular rate and rhythm, S1, S2 normal, no murmur, click, rub or gallop GI: soft, non-tender; bowel sounds normal; no masses,  no organomegaly Extremities: extremities normal, atraumatic, no cyanosis or edema Neurologic: Alert and oriented X 3, normal strength and tone. Normal symmetric reflexes. Normal coordination and gait  ECOG PERFORMANCE STATUS: 1 - Symptomatic but completely ambulatory  Blood pressure 127/90, pulse (!) 104, temperature (!) 97.5 F (36.4 C), resp. rate 18, height 5' 5.5" (1.664 m), weight 127 lb 6.4 oz (57.8 kg), SpO2 98 %.  LABORATORY DATA: Lab Results  Component Value Date   WBC 5.8 08/03/2019   HGB 14.8 08/03/2019   HCT  45.6 08/03/2019   MCV 87.0 08/03/2019   PLT 413 (H) 08/03/2019      Chemistry      Component Value Date/Time   NA 136 08/03/2019 1135   NA 141 07/16/2016 1220   K 4.2 08/03/2019 1135   K 4.0 07/16/2016 1220   CL 104 08/03/2019 1135   CL 104 07/19/2012 1432   CO2 21 (L) 08/03/2019 1135   CO2 24 07/16/2016 1220   BUN 11 08/03/2019 1135   BUN 8.8 07/16/2016 1220   CREATININE 1.06 08/03/2019 1135   CREATININE 1.0 07/16/2016 1220      Component Value Date/Time   CALCIUM 9.3 08/03/2019 1135   CALCIUM 9.3 07/16/2016 1220   ALKPHOS 119 08/03/2019 1135   ALKPHOS 92 07/16/2016 1220   AST 17 08/03/2019 1135   AST 18 07/16/2016 1220   ALT 16 08/03/2019 1135   ALT 14 07/16/2016 1220   BILITOT <0.2 (L) 08/03/2019 1135   BILITOT 0.39 07/16/2016 1220       RADIOGRAPHIC STUDIES: CT Chest W Contrast  Result Date: 08/05/2019 CLINICAL DATA:  Non-small cell lung cancer staging EXAM: CT CHEST WITH CONTRAST TECHNIQUE: Multidetector CT imaging of the chest was performed during intravenous contrast administration. CONTRAST:  16mL OMNIPAQUE IOHEXOL 300 MG/ML  SOLN COMPARISON:  03/06/2019 FINDINGS: Cardiovascular: Calcified and noncalcified atheromatous plaque of the thoracic aorta no sign of aneurysmal dilation. Similar to prior study. Heart size is stable. Central pulmonary vasculature on venous phase assessment is unremarkable. Mediastinum/Nodes: Distortion of mediastinal contours and hilar structures in the setting of LEFT paramediastinal soft tissue/post treatment changes and RIGHT paramediastinal and posterior chest bronchiectasis and scarring. Bilateral carotid arterial narrowing. This is unchanged compared to previous imaging. No axillary lymphadenopathy. No mediastinal adenopathy. No thoracic inlet adenopathy. Lungs/Pleura: Volume loss along the superior and medial RIGHT chest along the mediastinal border with similar appearance. Nodular area along a bronchus anterior to the RIGHT major fissure  in the RIGHT middle lobe 9 x 6 mm (image 69, series 7) inspissated secretions within the bronchus peripheral to this area. Mild fissural thickening is similar to slightly increased, approximately 6 mm thickness in the axial plane. This measures 7 mm in the sagittal plane and showed subtle increase in thickness over a series of prior imaging studies. Stable small nodule at the RIGHT lung base medially (image 108, 6) 6 x 4 mm. Stable approximately 5 mm nodule in the superior segment of the LEFT lower lobe. Masslike area along the LEFT mediastinal border measuring 3.5 x 2.8 cm previously 3.6 x 2.8 cm. Juxta diaphragmatic attic select attic atelectasis in the setting of LEFT hemidiaphragmatic elevation. Upper Abdomen: Elevated LEFT hemidiaphragm as before. Adrenal glands are normal. No acute upper abdominal process. Musculoskeletal: Spinal degenerative changes. No acute or destructive bone process. IMPRESSION: 1. Nodular area in the RIGHT middle lobe with increasing fissural thickening, this raises the question of disease recurrence or metastasis. PET exam may be helpful for further assessment. 2. Masslike area along the LEFT mediastinal border measuring 3.5 x 2.8 cm previously 3.6 x 2.8 cm. Findings are similar compared to the most recent comparison but slightly increased compared to the study of September 26, 2018 3. Stable small nodule at the RIGHT lung base medially. 4. Marked narrowing of the bilateral carotid arteries at the thoracic inlet may relate to prior radiation, unchanged over a series of prior studies. 5. Aortic atherosclerosis. These results will be called to the ordering clinician or representative by the Radiologist Assistant, and communication documented in the PACS or [employer]. Aortic Atherosclerosis (ICD10-I70.0). Electronically Signed   By: Zetta Bills M.D.   On: 08/05/2019 16:00    ASSESSMENT AND PLAN:  This is a very pleasant 69 years old African-American male with history of stage  IIIa non-small cell lung cancer of the right upper lobe status post concurrent chemoradiation followed by consolidation chemotherapy completed in May 2010. The patient has disease recurrence on the left side presented as a stage IIIa non-small cell lung cancer, squamous cell carcinoma status post concurrent chemoradiation followed by consolidation chemotherapy. Completed in March 2017. He also underwent SBRT to the right middle lobe lung nodule under the care of Dr. [name] completed on 07/24/2018. He also underwent SBRT to left lower lobe lung nodule under the care of Dr. [name] completed on April 27, 2019. The patient is currently on observation and he is doing fine.  Repeat CT scan of the chest showed a stable disease with slight increase in the right middle lobe pulmonary nodule by few millimeters. I personally and independently reviewed the scan images and discussed the result and showed the images to the patient and his daughter. I recommended for the patient to continue on observation for now with repeat CT  scan of the chest in 3 months for reevaluation of the right middle lobe nodule.  If the nodule continues to increase in size, we may consider him for SBRT. The patient and his daughter agreed to the current plan. For the history of COPD, he will continue his routine follow-up visit and evaluation by Dr. Elsworth Soho. The patient was advised to call immediately if he has any concerning symptoms in the interval. His scan showed evidence for disease progression and the left upper lobe pulmonary mass which could be remodeling of the scarring in that area versus disease recurrence. All questions were answered. The patient knows to call the clinic with any problems, questions or concerns. We can certainly see the patient much sooner if necessary.   Disclaimer: This note was dictated with voice recognition software. Similar sounding words can inadvertently be transcribed and may not be corrected upon  review.

## 2019-08-09 ENCOUNTER — Telehealth: Payer: Self-pay | Admitting: Internal Medicine

## 2019-08-09 NOTE — Telephone Encounter (Signed)
Scheduled appt per 7/6 los - pt aware of appts added. Reminder letter mailed

## 2019-08-16 ENCOUNTER — Other Ambulatory Visit: Payer: Self-pay | Admitting: Internal Medicine

## 2019-08-16 NOTE — Telephone Encounter (Signed)
Rx request 

## 2019-08-19 NOTE — Progress Notes (Signed)
Subjective Patient presents to [facility] and seen by the pharmacist for smoking cessation counseling. Patient was referred by Evan Quaker, NP. 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  At last office visit with the clinical pharmacist on 07/16/19, patient stated he was ready to quit, however a quit date was not set as he wanted to start using smoking cessation agents first. He was started on bupropion and recommended to call Evan [name] for nicotine patch/gum samples as they were not covered by his insurance.  Patient presents today in good spirits. He reports taking Wellbutrin every day as prescribed, however for the past 2-3 days, states he has skipped doses due to nausea, headaches, insomnia, bad dreams and feeling kind of "off". He was unable to pick up the nicotine patches because they were $55 at the pharmacy and his insurance does not cover the patches/gum, so he decided to only purchase the gum. He also states that he does not feel like the nicotine gum is working. He reports smoking 7-10 cigarettes per day and has not used any e-cigarettes for the past week because he ran out. His main triggers are stress, family, and keeping up with life, and that watching movies has helped him keep his mind off of smoking. He still smokes in the house but not in his car. Has not been smoking on the porch because it has been hot outside lately. He states that the swelling in his mouth and teeth tenderness has gotten better since starting Bevespi, however he still has a small lump with green pus and an odor and thinks it may be an infection.    Social History   Tobacco Use  Smoking Status Former Smoker  . Packs/day: 0.50  . Years: 45.00  . Pack years: 22.50  . Types: Cigarettes  .  Quit date: 04/02/2019  . Years since quitting: 0.3  Smokeless Tobacco Never Used   Tobacco Use History  Age when started using tobacco on a daily basis 30.  Type: Cigarettes and e-cigarettes.  Number of cigarettes per day 7-10, brand Evan [name].  Smokes first cigarette 10-30 minutes after waking.  Does not wake at night to smoke  Triggers include stress, family, keeping up with life  Quit Attempt History   Most recent quit attempt 04/02/19.  Longest time ever been tobacco free 2 year.  Methods tried in the past include nicotine patch, gum, lozenges, Chantix (experienced nightmares and mood).   Motivators to quitting include save money; barriers include stress  Immunization History  Administered Date(s) Administered  . Influenza Whole 03/25/2009  . Influenza,inj,Quad PF,6+ Mos 12/16/2014, 10/02/2015  . PFIZER SARS-COV-2 Vaccination 04/30/2019, 05/23/2019  . Pneumococcal Polysaccharide-23 03/25/2009, 06/04/2019  . Tdap 05/21/2012     Assessment and Plan  1. Smoking Cessation   Patient states they are ready to quit smoking.  Patient set quit date of Friday, August 6th. Encouraged patient to contact  [name] to obtain samples of the 14 mg nicotine patches and to continue the nicotine gum as needed. Discussed possibly discontinuing Wellbutrin as patient is experiencing side effects, however patient states that he has one bottle left and will like to continue until next visit. Instructed patient to stop taking bupropion if side effects worsen. Congratulated patient on not smoking e-cigarettes for the past week.  Set goal to continue to not smoke e-cigarettes. Also, instructed patient to contact his oncologist or PCP regarding the small lump in his mouth.   Schedule follow-up appointment via telephone on Monday, August 9th at 3:20PM  Nicotine Patch Patient counseled on purpose, proper use, and potential adverse effects, including mild itching or redness at the point of  application, headache, trouble sleeping, and/or vivid dreams    Patch Schedule for >10 cigarettes daily Weeks 1-6: one 21 mg patch daily Weeks 7-8: one 14 mg patch daily Weeks 9-10: one 7 mg patch daily  Patch Schedule for <10 cigarettes daily Weeks 1 to 6: one nicotine patch (14 mg) daily. I will call and re-assess how you are doing at the end of 6 weeks to see how you are doing on 14 mg patch and if you are ready to decrease dose of patch. Weeks 7-8: one nicotine patch (7 mg) daily  Nicotine Gum Patient counseled on purpose, proper use, and potential adverse effects including jaw soreness and upset stomach if swallowing saliva.  Instructed patient to use 4 mg if they smoke a pack a day or more and 2 mg if they smoke less than a pack a day.  Gum dosing schedule Weeks 1 to 6: Chew 1 piece of gum every 1 to 2 hours (maximum: 24 pieces/day); to increase chances of quitting, chew at least 9 pieces/day during the first 6 weeks Weeks 7 to 9: Chew 1 piece of gum every 2 to 4 hours (maximum: 24 pieces/day) Weeks 10 to 12: Chew 1 piece of gum every 4 to 8 hours (maximum: 24 pieces/day)  Bupropion Continue Bupropion 150 mg BID. Patient with no PMH of seizures. Patient counseled on purpose, proper use, and potential adverse effects, including insomnia, and potential change in mood.   Provided information on 1 800-QUIT NOW support program.   PLAN: - Encouraged patient to contact Evan [name] for samples of nicotine 14 mg patches - Continue bupropion 150 mg BID, if side-effects worsen, discontinue medication - Continue nicotine gum PRN - Follow-up via televisit on Monday, August 9th at 3:20PM  2. Medication Reconciliation A drug regimen assessment was performed, including review of allergies, interactions, disease-state management, dosing and immunization history. Medications were reviewed with the patient, including name, instructions, indication, goals of therapy, potential side effects,  importance of adherence, and safe use.  3. Immunizations Patient is indicated for the shingles vaccination. Follow-up at next office visit  This appointment required 60 minutes of patient care (this includes precharting, chart review, review of results, face-to-face care, etc.).  Lorel Monaco, PharmD PGY2 Ambulatory Care Resident [place]

## 2019-08-20 ENCOUNTER — Ambulatory Visit: Payer: Medicare PPO | Admitting: Pharmacist

## 2019-08-20 ENCOUNTER — Other Ambulatory Visit: Payer: Self-pay

## 2019-08-20 DIAGNOSIS — Z7189 Other specified counseling: Secondary | ICD-10-CM

## 2019-08-20 NOTE — Patient Instructions (Addendum)
Nice meeting you today!!  Quit date: August 6th  Please contact [place] [facility] [1-800-QUIT-NOW ([phone])] to get samples of the Nicotine 14 mg patches  Continue Wellbutrin twice daily and nicotine gum as needed.  Goal: continue to not smoke e-cigarettes  I will give you a call on Monday, August 9th at 3:20PM.

## 2019-08-30 ENCOUNTER — Other Ambulatory Visit: Payer: Self-pay | Admitting: Pulmonary Disease

## 2019-09-10 ENCOUNTER — Other Ambulatory Visit: Payer: Self-pay

## 2019-09-10 ENCOUNTER — Other Ambulatory Visit: Payer: Medicare PPO

## 2019-09-10 ENCOUNTER — Telehealth: Payer: Self-pay | Admitting: Pharmacist

## 2019-09-10 DIAGNOSIS — F17200 Nicotine dependence, unspecified, uncomplicated: Secondary | ICD-10-CM

## 2019-09-10 NOTE — Telephone Encounter (Signed)
Contacted patient to discuss smoking cessation progress. Patient reports that he has decreased to 5-7 cigarettes per day, stopped using e-cigarettes, and continues to use nicotine gum. Reports that he stopped taking bupropion a week ago because he has been losing too much weight in addition to experiencing nausea, headaches, and having more dreams than normal. He has not started the nicotine patches; however, he states that he will call [place] [facility] tomorrow morning for samples. Quit date was originally set for August 6th, however patient will like to set a new quit date once he has received the patches. Instructed patient to discontinue bupropion due to worsening side effects. Patient verbalized understanding. Will call patient in 2 weeks on August 23rd.  Lorel Monaco, PharmD PGY2 Ambulatory Care Resident [place]

## 2019-09-30 ENCOUNTER — Emergency Department (HOSPITAL_COMMUNITY)
Admission: EM | Admit: 2019-09-30 | Discharge: 2019-09-30 | Disposition: A | Discharge status: Home / Self Care | Payer: Medicare PPO | Attending: Emergency Medicine | Admitting: Emergency Medicine

## 2019-09-30 ENCOUNTER — Other Ambulatory Visit: Payer: Self-pay

## 2019-09-30 ENCOUNTER — Encounter (HOSPITAL_COMMUNITY): Payer: Self-pay | Admitting: Emergency Medicine

## 2019-09-30 DIAGNOSIS — Z7982 Long term (current) use of aspirin: Secondary | ICD-10-CM | POA: Insufficient documentation

## 2019-09-30 DIAGNOSIS — Z79899 Other long term (current) drug therapy: Secondary | ICD-10-CM | POA: Insufficient documentation

## 2019-09-30 DIAGNOSIS — I10 Essential (primary) hypertension: Secondary | ICD-10-CM | POA: Insufficient documentation

## 2019-09-30 DIAGNOSIS — Z85118 Personal history of other malignant neoplasm of bronchus and lung: Secondary | ICD-10-CM | POA: Diagnosis not present

## 2019-09-30 DIAGNOSIS — J449 Chronic obstructive pulmonary disease, unspecified: Secondary | ICD-10-CM | POA: Insufficient documentation

## 2019-09-30 DIAGNOSIS — K122 Cellulitis and abscess of mouth: Secondary | ICD-10-CM | POA: Diagnosis not present

## 2019-09-30 DIAGNOSIS — R339 Retention of urine, unspecified: Secondary | ICD-10-CM | POA: Insufficient documentation

## 2019-09-30 DIAGNOSIS — Z7951 Long term (current) use of inhaled steroids: Secondary | ICD-10-CM | POA: Insufficient documentation

## 2019-09-30 DIAGNOSIS — Z87891 Personal history of nicotine dependence: Secondary | ICD-10-CM | POA: Insufficient documentation

## 2019-09-30 LAB — URINALYSIS, ROUTINE W REFLEX MICROSCOPIC
Bilirubin Urine: NEGATIVE
Glucose, UA: NEGATIVE mg/dL
Hgb urine dipstick: NEGATIVE
Ketones, ur: NEGATIVE mg/dL
Leukocytes,Ua: NEGATIVE
Nitrite: NEGATIVE
Protein, ur: NEGATIVE mg/dL
Specific Gravity, Urine: 1.009 (ref 1.005–1.030)
pH: 7 (ref 5.0–8.0)

## 2019-09-30 MED ORDER — LIDOCAINE HCL URETHRAL/MUCOSAL 2 % EX GEL
1.0000 "application " | Freq: Once | CUTANEOUS | Status: DC | PRN
  Filled 2019-09-30: qty 30

## 2019-09-30 MED ORDER — PENICILLIN V POTASSIUM 500 MG PO TABS: 500.0000 mg | ORAL_TABLET | Freq: Three times a day (TID) | ORAL | 0 refills | Status: DC

## 2019-09-30 NOTE — Discharge Instructions (Addendum)
Please call Dr. Ralene Muskrat office tomorrow for recheck for catheter removal this week. Take antibiotics as prescribed.  Call your dentist for recheck this week.

## 2019-09-30 NOTE — ED Provider Notes (Signed)
Evan [name] Provider Note   CSN: [id number] Arrival date & time: 09/30/19  5361     History Chief Complaint  Patient presents with  . Urinary Retention    Evan [name] is a 69 y.o. male.  HPI 69 yo male ho lung cancer presents today with inability to void since yesterday.  Patient denies uti sxs, history of previous urinary retention, denies recent medication changes.  Dyspnea and lung ca symptoms stable per patient.  Treated by Dr. Earlie Server.  NO fever, chills, nausea, vomiting, chest pain.  No report of covid symptoms or exposure.  Patient report s having previously been vaccinated for covid with phizer vaccine completed April 2021.      Past Medical History:  Diagnosis Date  . Anxiety   . Complication of anesthesia    " sometimes I wake up during surgery "  . Depression   . Left renal mass 07/19/2016  . Lung cancer ([place]) dx'd 2010    NSCL CA right  . Lung cancer ([place]) dx'd 2017   left    Patient Active Problem List   Diagnosis Date Noted  . Primary cancer of left lower lobe of lung ([place]) 04/06/2019  . Involuntary movements 08/24/2018  . Primary cancer of right middle lobe of lung ([place]) 07/16/2018  . Nodule of middle lobe of right lung 06/20/2018  . Bilateral carotid artery stenosis 02/18/2017  . Left renal mass 07/19/2016  . Phlebitis 02/04/2015  . Full code status 11/04/2014  . Encounter for antineoplastic chemotherapy 11/04/2014  . 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 TOBACCO ABUSE 04/02/2008  . 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 HYPERLIPIDEMIA 02/09/2008  . DEPRESSION 02/09/2008  . Essential hypertension 02/09/2008  . COPD (chronic obstructive pulmonary disease) ([place]) 02/09/2008  . HEADACHE 02/09/2008    Past Surgical History:  Procedure Laterality Date  . AMPUTATION Right 05/21/2012   Procedure:  AMPUTATION BELOW KNEE;  Surgeon: Newt Minion, MD;  Location: [facility];  Service: Orthopedics;  Laterality: Right;  . AMPUTATION Right 06/21/2012   Procedure: Revision AMPUTATION BELOW KNEE Right;  Surgeon: Newt Minion, MD;  Location: [facility];  Service: Orthopedics;  Laterality: Right;  Revision right Below Knee Amputation  . COLONOSCOPY W/ BIOPSIES AND POLYPECTOMY    . FOOT AMPUTATION Right    traumatic right lower extremity  . UPPER JAW     SURGERY FOR INFECTION       Family History  Problem Relation Age of Onset  . Heart disease Mother   . Diabetes Mother   . Prostate cancer Father     Social History   Tobacco Use  . Smoking status: Former Smoker    Packs/day: 0.50    Years: 45.00    Pack years: 22.50    Types: Cigarettes    Quit date: 04/02/2019    Years since quitting: 0.4  . Smokeless tobacco: Never Used  Vaping Use  . Vaping Use: Every day  Substance Use Topics  . Alcohol use: No    Alcohol/week: 0.0 standard drinks    Comment: rare  . Drug use: No    Home Medications Prior to Admission medications   Medication Sig Start Date End Date Taking? Authorizing Provider  acetaminophen (TYLENOL) 500 MG tablet Take by mouth.    [provider]  albuterol (VENTOLIN HFA) 108 (90 Base) MCG/ACT  inhaler TAKE 2 PUFFS BY MOUTH EVERY 6 HOURS AS NEEDED FOR WHEEZE OR SHORTNESS OF BREATH 08/30/19   Rigoberto Noel, MD  Budeson-Glycopyrrol-Formoterol (BREZTRI AEROSPHERE) 160-9-4.8 MCG/ACT AERO Inhale 2 puffs into the lungs 2 (two) times daily. 06/26/19   Rigoberto Noel, MD  [facility] ASPIRIN EC 325 MG EC tablet TAKE 1 TABLET BY MOUTH EVERY DAY 08/16/19   Vaslow, Acey Lav, MD  Glycopyrrolate-Formoterol (BEVESPI AEROSPHERE) 9-4.8 MCG/ACT AERO Inhale 2 puffs into the lungs 2 (two) times daily. 06/26/19   Rigoberto Noel, MD  ibuprofen (ADVIL,MOTRIN) 600 MG tablet Take 600 mg by mouth every 6 (six) hours as needed.    [provider]  Multiple Vitamin (MULTIVITAMIN WITH MINERALS) TABS  tablet Take 1 tablet by mouth daily.    [provider]  nicotine (NICODERM CQ - DOSED IN MG/24 HOURS) 14 mg/24hr patch Place 1 patch (14 mg total) onto the skin daily. For 6 weeks.  Then decrease to 7 mg daily. Patient not taking: Reported on 08/20/2019 07/25/19   Yopp, [employer] C, RPH-CPP  nicotine polacrilex (NICORETTE) 4 MG gum Take 1 each (4 mg total) by mouth as needed for smoking cessation. 07/25/19   Yopp, Amber C, RPH-CPP  Nutritional Supplements (CARNATION BREAKFAST ESSENTIALS PO) Take 1 packet by mouth daily.  Patient not taking: Reported on 07/16/2019    [provider]    Allergies    Patient has no known allergies.  Review of Systems   Review of Systems  Physical Exam Updated Vital Signs BP (!) 108/96 (BP Location: Right Arm)   Pulse (!) 108   Temp 97.9 F (36.6 C) (Oral)   Resp 19   SpO2 99%   Physical Exam Vitals and nursing note reviewed.  Constitutional:      General: He is not in acute distress.    Appearance: Normal appearance.  HENT:     Head: Normocephalic.     Right Ear: External ear normal.     Left Ear: External ear normal.     Nose: Nose normal.     Mouth/Throat:     Mouth: Mucous membranes are moist.     Comments: Patient with complaints of discharge from behind upper incisors,no fluctuance, redness or discharge noted on exam. Eyes:     Pupils: Pupils are equal, round, and reactive to light.  Cardiovascular:     Rate and Rhythm: Normal rate and regular rhythm.     Pulses: Normal pulses.  Pulmonary:     Effort: Pulmonary effort is normal.  Abdominal:     General: Abdomen is flat.     Palpations: Abdomen is soft.  Genitourinary:    Comments: Foley place by nurse tech with uop 750 cc Musculoskeletal:        General: Normal range of motion.     Cervical back: Normal range of motion.     Comments: Left bka  Skin:    General: Skin is warm and dry.     Capillary Refill: Capillary refill takes less than 2 seconds.  Neurological:      General: No focal deficit present.     Mental Status: He is alert.  Psychiatric:        Mood and Affect: Mood normal.     ED Results / Procedures / Treatments   Labs (all labs ordered are listed, but only abnormal results are displayed) Labs Reviewed  URINALYSIS, ROUTINE W REFLEX MICROSCOPIC    EKG None  Radiology No results found.  Procedures  Procedures (including critical care time)  Medications Ordered in ED Medications  lidocaine (XYLOCAINE) 2 % jelly 1 application (has no administration in time range)    ED Course  I have reviewed the triage vital signs and the nursing notes.  Pertinent labs & imaging results that were available during my care of the patient were reviewed by me and considered in my medical decision making (see chart for details).    MDM Rules/Calculators/A&P                          1 acute urinary retention Foley placed with resolution of symptoms and no evidence of infection noted on urinalysis 2 patient complains of sore throat and has had discharge from upper palate behind front incisors.  Son shows me a video that shows some pus coming out of this.  That was in July.  This has decreased.  However, he states that he has continued.  I do not see any obvious signs of abscess on my exam.  Plan penicillin and advise close follow-up with his dentist. Final Clinical Impression(s) / ED Diagnoses Final diagnoses:  Urinary retention  Oral abscess    Rx / DC Orders ED Discharge Orders    None       Pattricia Boss, MD 09/30/19 1221

## 2019-09-30 NOTE — ED Triage Notes (Signed)
Pt reports had trouble urinating for couple days. Reports drank some beer and cranberry juice yesterday which helped. Also took laxatives yesterday and had some BM today. C/o pain in bladder area and back.  Lung cancer patient seen at Cancer center.

## 2019-10-01 ENCOUNTER — Telehealth: Payer: Self-pay | Admitting: Pharmacist

## 2019-10-01 DIAGNOSIS — F17200 Nicotine dependence, unspecified, uncomplicated: Secondary | ICD-10-CM

## 2019-10-01 NOTE — Telephone Encounter (Signed)
Contacted patient to discuss smoking cessation progress. Patient reports he has decreased to 3-4 cigarettes per day (originally 5-7 cigs/day) and continues to use nicotine gum. Has not started nicotine patch yet due to being under a lot of stress with his health. Recently went to the ED yesterday (8/29) because he felt like his body was shutting down, inability to void for the past day, and also reported off/on loss of appetite. Congratulated patient on decreasing the number of cigarettes used each day. Patient not ready to set a quit date yet, however will like [place] to follow-up with him in 2-3 weeks to assess readiness to quit.  Lorel Monaco, PharmD PGY2 Ambulatory Care Resident [place]

## 2019-10-22 ENCOUNTER — Telehealth: Payer: Self-pay | Admitting: Pharmacist

## 2019-10-22 DIAGNOSIS — F17200 Nicotine dependence, unspecified, uncomplicated: Secondary | ICD-10-CM

## 2019-10-22 NOTE — Telephone Encounter (Signed)
Contacted patient to discuss smoking cessation progress. Patient reports he smokes 4-5 cigarettes per day and occasionally uses nicotine gum (causes nausea at times). He has not started nicotine patches yet, however is ready to set a quit date of September 23rd (this Thursday). Congratulated patient on setting a quit date. Encouraged patient to contact [facility] for patches and will call him Monday, September 27th to assess progress.   Lorel Monaco, PharmD PGY2 Ambulatory Care Resident [place]

## 2019-10-28 ENCOUNTER — Encounter (HOSPITAL_COMMUNITY): Payer: Self-pay

## 2019-10-28 ENCOUNTER — Emergency Department (HOSPITAL_COMMUNITY): Payer: Medicare PPO

## 2019-10-28 ENCOUNTER — Inpatient Hospital Stay (HOSPITAL_COMMUNITY)
Admission: EM | Admit: 2019-10-28 | Discharge: 2019-10-31 | DRG: 193 | Disposition: A | Discharge status: Home Health | Payer: Medicare PPO | Attending: Internal Medicine | Admitting: Internal Medicine

## 2019-10-28 ENCOUNTER — Other Ambulatory Visit: Payer: Self-pay

## 2019-10-28 DIAGNOSIS — Z8249 Family history of ischemic heart disease and other diseases of the circulatory system: Secondary | ICD-10-CM

## 2019-10-28 DIAGNOSIS — Z20822 Contact with and (suspected) exposure to covid-19: Secondary | ICD-10-CM | POA: Diagnosis present

## 2019-10-28 DIAGNOSIS — Z79899 Other long term (current) drug therapy: Secondary | ICD-10-CM

## 2019-10-28 DIAGNOSIS — C3490 Malignant neoplasm of unspecified part of unspecified bronchus or lung: Secondary | ICD-10-CM | POA: Diagnosis present

## 2019-10-28 DIAGNOSIS — J189 Pneumonia, unspecified organism: Secondary | ICD-10-CM | POA: Diagnosis not present

## 2019-10-28 DIAGNOSIS — R0602 Shortness of breath: Secondary | ICD-10-CM | POA: Diagnosis not present

## 2019-10-28 DIAGNOSIS — E440 Moderate protein-calorie malnutrition: Secondary | ICD-10-CM | POA: Diagnosis present

## 2019-10-28 DIAGNOSIS — Z888 Allergy status to other drugs, medicaments and biological substances status: Secondary | ICD-10-CM

## 2019-10-28 DIAGNOSIS — C3412 Malignant neoplasm of upper lobe, left bronchus or lung: Secondary | ICD-10-CM | POA: Diagnosis present

## 2019-10-28 DIAGNOSIS — Z9221 Personal history of antineoplastic chemotherapy: Secondary | ICD-10-CM | POA: Diagnosis not present

## 2019-10-28 DIAGNOSIS — Z23 Encounter for immunization: Secondary | ICD-10-CM | POA: Diagnosis present

## 2019-10-28 DIAGNOSIS — Z923 Personal history of irradiation: Secondary | ICD-10-CM

## 2019-10-28 DIAGNOSIS — J449 Chronic obstructive pulmonary disease, unspecified: Secondary | ICD-10-CM | POA: Diagnosis present

## 2019-10-28 DIAGNOSIS — J9621 Acute and chronic respiratory failure with hypoxia: Secondary | ICD-10-CM | POA: Diagnosis present

## 2019-10-28 DIAGNOSIS — I10 Essential (primary) hypertension: Secondary | ICD-10-CM | POA: Diagnosis present

## 2019-10-28 DIAGNOSIS — E785 Hyperlipidemia, unspecified: Secondary | ICD-10-CM | POA: Diagnosis present

## 2019-10-28 DIAGNOSIS — N3090 Cystitis, unspecified without hematuria: Secondary | ICD-10-CM

## 2019-10-28 DIAGNOSIS — N401 Enlarged prostate with lower urinary tract symptoms: Secondary | ICD-10-CM | POA: Diagnosis present

## 2019-10-28 DIAGNOSIS — K5900 Constipation, unspecified: Secondary | ICD-10-CM | POA: Diagnosis present

## 2019-10-28 DIAGNOSIS — Z87891 Personal history of nicotine dependence: Secondary | ICD-10-CM

## 2019-10-28 DIAGNOSIS — R627 Adult failure to thrive: Secondary | ICD-10-CM | POA: Diagnosis present

## 2019-10-28 DIAGNOSIS — J440 Chronic obstructive pulmonary disease with acute lower respiratory infection: Secondary | ICD-10-CM | POA: Diagnosis present

## 2019-10-28 DIAGNOSIS — I700 Atherosclerosis of aorta: Secondary | ICD-10-CM | POA: Diagnosis present

## 2019-10-28 DIAGNOSIS — J441 Chronic obstructive pulmonary disease with (acute) exacerbation: Secondary | ICD-10-CM | POA: Diagnosis present

## 2019-10-28 DIAGNOSIS — R338 Other retention of urine: Secondary | ICD-10-CM | POA: Diagnosis present

## 2019-10-28 DIAGNOSIS — Z89511 Acquired absence of right leg below knee: Secondary | ICD-10-CM

## 2019-10-28 DIAGNOSIS — J42 Unspecified chronic bronchitis: Secondary | ICD-10-CM | POA: Diagnosis not present

## 2019-10-28 DIAGNOSIS — Z681 Body mass index (BMI) 19 or less, adult: Secondary | ICD-10-CM | POA: Diagnosis not present

## 2019-10-28 DIAGNOSIS — Z86010 Personal history of colonic polyps: Secondary | ICD-10-CM | POA: Diagnosis not present

## 2019-10-28 DIAGNOSIS — R8271 Bacteriuria: Secondary | ICD-10-CM | POA: Diagnosis present

## 2019-10-28 DIAGNOSIS — R591 Generalized enlarged lymph nodes: Secondary | ICD-10-CM

## 2019-10-28 DIAGNOSIS — Y842 Radiological procedure and radiotherapy as the cause of abnormal reaction of the patient, or of later complication, without mention of misadventure at the time of the procedure: Secondary | ICD-10-CM | POA: Diagnosis present

## 2019-10-28 DIAGNOSIS — J410 Simple chronic bronchitis: Secondary | ICD-10-CM | POA: Diagnosis not present

## 2019-10-28 LAB — CBC WITH DIFFERENTIAL/PLATELET
Abs Immature Granulocytes: 0.02 10*3/uL (ref 0.00–0.07)
Basophils Absolute: 0.1 10*3/uL (ref 0.0–0.1)
Basophils Relative: 1 %
Eosinophils Absolute: 0.7 10*3/uL — ABNORMAL HIGH (ref 0.0–0.5)
Eosinophils Relative: 8 %
HCT: 40.1 % (ref 39.0–52.0)
Hemoglobin: 13 g/dL (ref 13.0–17.0)
Immature Granulocytes: 0 %
Lymphocytes Relative: 14 %
Lymphs Abs: 1.2 10*3/uL (ref 0.7–4.0)
MCH: 27.3 pg (ref 26.0–34.0)
MCHC: 32.4 g/dL (ref 30.0–36.0)
MCV: 84.2 fL (ref 80.0–100.0)
Monocytes Absolute: 0.9 10*3/uL (ref 0.1–1.0)
Monocytes Relative: 11 %
Neutro Abs: 5.6 10*3/uL (ref 1.7–7.7)
Neutrophils Relative %: 66 %
Platelets: 452 10*3/uL — ABNORMAL HIGH (ref 150–400)
RBC: 4.76 MIL/uL (ref 4.22–5.81)
RDW: 13.9 % (ref 11.5–15.5)
WBC: 8.5 10*3/uL (ref 4.0–10.5)
nRBC: 0 % (ref 0.0–0.2)

## 2019-10-28 LAB — URINALYSIS, ROUTINE W REFLEX MICROSCOPIC
Bilirubin Urine: NEGATIVE
Glucose, UA: NEGATIVE mg/dL
Ketones, ur: NEGATIVE mg/dL
Nitrite: NEGATIVE
Protein, ur: 30 mg/dL — AB
Specific Gravity, Urine: 1.02 (ref 1.005–1.030)
WBC, UA: >50 WBC/hpf — ABNORMAL HIGH (ref 0–5)
pH: 6 (ref 5.0–8.0)

## 2019-10-28 LAB — COMPREHENSIVE METABOLIC PANEL
ALT: 19 U/L (ref 0–44)
AST: 19 U/L (ref 15–41)
Albumin: 3.1 g/dL — ABNORMAL LOW (ref 3.5–5.0)
Alkaline Phosphatase: 85 U/L (ref 38–126)
Anion gap: 11 (ref 5–15)
BUN: 10 mg/dL (ref 8–23)
CO2: 25 mmol/L (ref 22–32)
Calcium: 8.7 mg/dL — ABNORMAL LOW (ref 8.9–10.3)
Chloride: 100 mmol/L (ref 98–111)
Creatinine, Ser: 0.7 mg/dL (ref 0.61–1.24)
GFR calc Af Amer: >60 mL/min (ref >60)
GFR calc non Af Amer: >60 mL/min (ref >60)
Glucose, Bld: 90 mg/dL (ref 70–99)
Potassium: 4.2 mmol/L (ref 3.5–5.1)
Sodium: 136 mmol/L (ref 135–145)
Total Bilirubin: 0.4 mg/dL (ref 0.3–1.2)
Total Protein: 7.4 g/dL (ref 6.5–8.1)

## 2019-10-28 LAB — LIPASE, BLOOD: Lipase: 21 U/L (ref 11–51)

## 2019-10-28 LAB — RESPIRATORY PANEL BY RT PCR (FLU A&B, COVID)
Influenza A by PCR: NEGATIVE
Influenza B by PCR: NEGATIVE
SARS Coronavirus 2 by RT PCR: NEGATIVE

## 2019-10-28 MED ORDER — UMECLIDINIUM BROMIDE 62.5 MCG/INH IN AEPB
1.0000 | INHALATION_SPRAY | Freq: Every day | RESPIRATORY_TRACT | Status: DC
  Filled 2019-10-28: qty 7

## 2019-10-28 MED ORDER — SODIUM CHLORIDE (PF) 0.9 % IJ SOLN
INTRAMUSCULAR | Status: AC
  Filled 2019-10-28: qty 50

## 2019-10-28 MED ORDER — ENOXAPARIN SODIUM 40 MG/0.4ML ~~LOC~~ SOLN
40.0000 mg | SUBCUTANEOUS | Status: DC
  Administered 2019-10-29 – 2019-10-31 (×3): 40 mg via SUBCUTANEOUS
  Filled 2019-10-28 (×3): qty 0.4

## 2019-10-28 MED ORDER — BOOST PO LIQD
237.0000 mL | Freq: Every day | ORAL | Status: DC
  Filled 2019-10-28: qty 237

## 2019-10-28 MED ORDER — NICOTINE POLACRILEX 2 MG MT GUM
4.0000 mg | CHEWING_GUM | OROMUCOSAL | Status: DC | PRN
  Filled 2019-10-28: qty 2

## 2019-10-28 MED ORDER — SODIUM CHLORIDE 0.9 % IV SOLN
1.0000 g | Freq: Once | INTRAVENOUS | Status: AC
  Administered 2019-10-28: 1 g via INTRAVENOUS
  Filled 2019-10-28: qty 10

## 2019-10-28 MED ORDER — ADULT MULTIVITAMIN W/MINERALS CH
1.0000 | ORAL_TABLET | Freq: Every day | ORAL | Status: DC
  Administered 2019-10-29 – 2019-10-31 (×3): 1 via ORAL
  Filled 2019-10-28 (×3): qty 1

## 2019-10-28 MED ORDER — SODIUM CHLORIDE 0.9 % IV BOLUS
1000.0000 mL | Freq: Once | INTRAVENOUS | Status: AC
  Administered 2019-10-28: 1000 mL via INTRAVENOUS

## 2019-10-28 MED ORDER — TAMSULOSIN HCL 0.4 MG PO CAPS
0.4000 mg | ORAL_CAPSULE | Freq: Every day | ORAL | Status: DC
  Administered 2019-10-28 – 2019-10-30 (×3): 0.4 mg via ORAL
  Filled 2019-10-28 (×3): qty 1

## 2019-10-28 MED ORDER — SODIUM CHLORIDE 0.9 % IV SOLN
500.0000 mg | INTRAVENOUS | Status: DC
  Administered 2019-10-30 – 2019-10-31 (×2): 500 mg via INTRAVENOUS
  Filled 2019-10-28 (×2): qty 500

## 2019-10-28 MED ORDER — SODIUM CHLORIDE 0.9 % IV SOLN
500.0000 mg | Freq: Once | INTRAVENOUS | Status: AC
  Administered 2019-10-29: 500 mg via INTRAVENOUS
  Filled 2019-10-28: qty 500

## 2019-10-28 MED ORDER — OXYCODONE HCL 5 MG PO TABS: 5.0000 mg | ORAL_TABLET | ORAL | Status: DC | PRN

## 2019-10-28 MED ORDER — SODIUM CHLORIDE 0.9 % IV SOLN
2.0000 g | INTRAVENOUS | Status: DC
  Administered 2019-10-29 – 2019-10-30 (×2): 2 g via INTRAVENOUS
  Filled 2019-10-28 (×2): qty 2

## 2019-10-28 MED ORDER — ALBUTEROL SULFATE HFA 108 (90 BASE) MCG/ACT IN AERS
4.0000 | INHALATION_SPRAY | RESPIRATORY_TRACT | Status: DC | PRN
  Administered 2019-10-28: 4 via RESPIRATORY_TRACT
  Filled 2019-10-28: qty 6.7

## 2019-10-28 MED ORDER — IOHEXOL 350 MG/ML SOLN
100.0000 mL | Freq: Once | INTRAVENOUS | Status: AC | PRN
  Administered 2019-10-28: 100 mL via INTRAVENOUS

## 2019-10-28 NOTE — ED Notes (Signed)
Blood work was unsuccessful. Nurse aware.

## 2019-10-28 NOTE — ED Provider Notes (Signed)
[name] Provider Note   CSN: [id number] Arrival date & time: 10/28/19  1451     History Chief Complaint  Patient presents with  . Shortness of Breath  . Abdominal Pain    Evan [name] is a 69 y.o. 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  Patient is daughter provides additional history.  She reports that patient living independently up until today.  She has noted that has been progressively weaker and weaker, had a very poor appetite and not eating at home, and now has been extremely weak and unable to stand up.  Patient reports that he feels off balance and falls when he tries to stand up.  He feels extremely deconditioned and weak.  He has no appetite.  He has been complaining of progressively worsening epigastric abdominal pain, with nausea but no vomiting.  Last bowel movement was 3 days ago and he is currently passing gas.  They are unaware of any metastases from the lung cancer per his history.    He does not wear oxygen at home.  He continues smoking.  He is vaccinated for covid.    HPI     Past Medical History:  Diagnosis Date  . Anxiety   . Complication of anesthesia    " sometimes I wake up during surgery "  . Depression   . Left renal mass 07/19/2016  . Lung cancer ([place]) dx'd 2010    NSCL CA right  . Lung cancer ([facility]) dx'd 2017   left    Patient Active Problem List   Diagnosis Date Noted  . Constipation 10/29/2019  . Asymptomatic bacteriuria 10/29/2019  . Pneumonia 10/28/2019  . Primary cancer of left lower lobe of lung ([place]) 04/06/2019  . Involuntary movements 08/24/2018  . Primary cancer of right middle lobe of lung ([place]) 07/16/2018  . Nodule of middle lobe of right lung 06/20/2018  . Bilateral carotid artery  stenosis 02/18/2017  . Left renal mass 07/19/2016  . Phlebitis 02/04/2015  . Full code status 11/04/2014  . Encounter for antineoplastic chemotherapy 11/04/2014  . 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 TOBACCO ABUSE 04/02/2008  . 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 HYPERLIPIDEMIA 02/09/2008  . DEPRESSION 02/09/2008  . Essential hypertension 02/09/2008  . COPD (chronic obstructive pulmonary disease) ([place]) 02/09/2008  . HEADACHE 02/09/2008    Past Surgical History:  Procedure Laterality Date  . AMPUTATION Right 05/21/2012   Procedure: AMPUTATION BELOW KNEE;  Surgeon: Newt Minion, MD;  Location: [facility];  Service: Orthopedics;  Laterality: Right;  . AMPUTATION Right 06/21/2012   Procedure: Revision AMPUTATION BELOW KNEE Right;  Surgeon: Newt Minion, MD;  Location: [facility];  Service: Orthopedics;  Laterality: Right;  Revision right Below Knee Amputation  . COLONOSCOPY W/ BIOPSIES AND POLYPECTOMY    . FOOT AMPUTATION Right    traumatic right lower extremity  . UPPER JAW     SURGERY FOR INFECTION       Family History  Problem Relation Age of Onset  . Heart disease Mother   . Diabetes Mother   . Prostate cancer Father     Social History   Tobacco Use  .  Smoking status: Former Smoker    Packs/day: 0.50    Years: 45.00    Pack years: 22.50    Types: Cigarettes    Quit date: 04/02/2019    Years since quitting: 0.5  . Smokeless tobacco: Never Used  Vaping Use  . Vaping Use: Every day  Substance Use Topics  . Alcohol use: No    Alcohol/week: 0.0 standard drinks    Comment: rare  . Drug use: No    Home Medications Prior to Admission medications   Medication Sig Start Date End Date Taking? Authorizing Provider  acetaminophen (TYLENOL) 500 MG tablet Take 500 mg by mouth every 6 (six) hours as needed for mild pain, fever or headache.    Yes [provider]  albuterol (VENTOLIN HFA) 108 (90 Base) MCG/ACT inhaler TAKE 2 PUFFS BY MOUTH EVERY 6 HOURS AS NEEDED FOR WHEEZE OR SHORTNESS OF BREATH Patient taking differently: Inhale 2 puffs into the lungs every 6 (six) hours as needed for wheezing or shortness of breath.  08/30/19  Yes Rigoberto Noel, MD  Glycopyrrolate-Formoterol (BEVESPI AEROSPHERE) 9-4.8 MCG/ACT AERO Inhale 2 puffs into the lungs 2 (two) times daily. 06/26/19  Yes Rigoberto Noel, MD  lactose free nutrition (BOOST) LIQD Take 237 mLs by mouth daily.   Yes [provider]  Multiple Vitamin (MULTIVITAMIN WITH MINERALS) TABS tablet Take 1 tablet by mouth daily.   Yes [provider]  nicotine polacrilex (NICORETTE) 4 MG gum Take 1 each (4 mg total) by mouth as needed for smoking cessation. 07/25/19  Yes Yopp, Amber C, RPH-CPP  tamsulosin (FLOMAX) 0.4 MG CAPS capsule Take 0.4 mg by mouth at bedtime. 10/10/19  Yes [provider]  [employer] ASPIRIN EC 325 MG EC tablet TAKE 1 TABLET BY MOUTH EVERY DAY Patient taking differently: Take 325 mg by mouth daily.  08/16/19   Vaslow, Acey Lav, MD  nicotine (NICODERM CQ - DOSED IN MG/24 HOURS) 14 mg/24hr patch Place 1 patch (14 mg total) onto the skin daily. For 6 weeks.  Then decrease to 7 mg daily. Patient not taking: Reported on 08/20/2019 07/25/19   Yopp, Amber C, RPH-CPP  penicillin v potassium (VEETID) 500 MG tablet Take 1 tablet (500 mg total) by mouth 3 (three) times daily. 09/30/19   Pattricia Boss, MD    Allergies    Chantix [varenicline]  Review of Systems   Review of Systems  Constitutional: Positive for appetite change, chills and fatigue.  HENT: Negative for ear pain and sore throat.   Eyes: Negative for pain and visual disturbance.  Respiratory: Positive for shortness of breath. Negative for cough.   Cardiovascular: Negative for chest pain and palpitations.  Gastrointestinal: Positive for abdominal pain and nausea. Negative for vomiting.  Genitourinary:  Negative for dysuria and hematuria.  Musculoskeletal: Negative for arthralgias and back pain.  Skin: Negative for color change and rash.  Neurological: Positive for light-headedness. Negative for syncope.  Psychiatric/Behavioral: Negative for agitation and confusion.  All other systems reviewed and are negative.   Physical Exam Updated Vital Signs BP 102/80 (BP Location: Right Arm)   Pulse 92   Temp 98 F (36.7 C) (Oral)   Resp 16   Ht 5' 5.5" (1.664 m)   Wt 54.4 kg   SpO2 100%   BMI 19.67 kg/m   Physical Exam Vitals and nursing note reviewed.  Constitutional:      Comments: Tired appearing, thin, chronically ill appearing  HENT:     Head: Normocephalic and  atraumatic.  Eyes:     Conjunctiva/sclera: Conjunctivae normal.  Cardiovascular:     Rate and Rhythm: Normal rate and regular rhythm.  Pulmonary:     Effort: Pulmonary effort is normal. No respiratory distress.     Comments: Coarse breath sounds bilaterally 95% on room air RR 18 Abdominal:     Palpations: Abdomen is soft.     Tenderness: There is abdominal tenderness in the epigastric area.  Musculoskeletal:     Cervical back: Neck supple.  Skin:    General: Skin is warm and dry.  Neurological:     General: No focal deficit present.     Mental Status: He is alert and oriented to person, place, and time.     ED Results / Procedures / Treatments   Labs (all labs ordered are listed, but only abnormal results are displayed) Labs Reviewed  URINALYSIS, ROUTINE W REFLEX MICROSCOPIC - Abnormal; Notable for the following components:      Result Value   APPearance HAZY (*)    Hgb urine dipstick SMALL (*)    Protein, ur 30 (*)    Leukocytes,Ua SMALL (*)    WBC, UA >50 (*)    Bacteria, UA FEW (*)    All other components within normal limits  CBC WITH DIFFERENTIAL/PLATELET - Abnormal; Notable for the following components:   Platelets 452 (*)    Eosinophils Absolute 0.7 (*)    All other components within normal  limits  COMPREHENSIVE METABOLIC PANEL - Abnormal; Notable for the following components:   Calcium 8.7 (*)    Albumin 3.1 (*)    All other components within normal limits  COMPREHENSIVE METABOLIC PANEL - Abnormal; Notable for the following components:   Calcium 8.8 (*)    Albumin 2.9 (*)    All other components within normal limits  CBC WITH DIFFERENTIAL/PLATELET - Abnormal; Notable for the following components:   RBC 4.20 (*)    Hemoglobin 11.5 (*)    HCT 35.1 (*)    Platelets 423 (*)    All other components within normal limits  RESPIRATORY PANEL BY RT PCR (FLU A&B, COVID)  EXPECTORATED SPUTUM ASSESSMENT W REFEX TO RESP CULTURE  URINE CULTURE  LIPASE, BLOOD  HIV ANTIBODY (ROUTINE TESTING W REFLEX)  STREP PNEUMONIAE URINARY ANTIGEN  LEGIONELLA PNEUMOPHILA SEROGP 1 UR AG    EKG None  Radiology CT Head Wo Contrast  Result Date: 10/28/2019 CLINICAL DATA:  Non-small cell lung cancer now with dizziness and balance difficulties. Headache. EXAM: CT HEAD WITHOUT CONTRAST TECHNIQUE: Contiguous axial images were obtained from the base of the skull through the vertex without intravenous contrast. COMPARISON:  MRI brain 08/29/2018 FINDINGS: Brain: No mass effect or midline shift. No abnormal extra-axial fluid collections. Gray-white matter junctions are distinct. Basal cisterns are not effaced. No acute intracranial hemorrhage. No specific evidence of an intracranial mass although contrast-enhanced MRI would be the most sensitive modality for detection of intracranial metastatic disease. Vascular: No hyperdense vessel or unexpected calcification. Skull: Calvarium appears intact.  No focal lesions identified. Sinuses/Orbits: Paranasal sinuses and mastoid air cells are clear. Other: None. IMPRESSION: No acute intracranial abnormalities. No specific evidence of an intracranial mass although contrast-enhanced MRI would be the most sensitive modality for detection of intracranial metastatic disease.  Electronically Signed   By: Lucienne Capers M.D.   On: 10/28/2019 22:28   CT Angio Chest PE W and/or Wo Contrast  Result Date: 10/28/2019 CLINICAL DATA:  New onset chest pain and epigastric pain. History of  lung cancer. EXAM: CT ANGIOGRAPHY CHEST CT ABDOMEN AND PELVIS WITH CONTRAST TECHNIQUE: Multidetector CT imaging of the chest was performed using the standard protocol during bolus administration of intravenous contrast. Multiplanar CT image reconstructions and MIPs were obtained to evaluate the vascular anatomy. Multidetector CT imaging of the abdomen and pelvis was performed using the standard protocol during bolus administration of intravenous contrast. CONTRAST:  12mL OMNIPAQUE IOHEXOL 350 MG/ML SOLN COMPARISON:  CT chest 08/03/2019.  PET-CT 03/30/2019 FINDINGS: CTA CHEST FINDINGS Cardiovascular: Good opacification of the central and segmental pulmonary arteries. No focal filling defects. No evidence of significant pulmonary embolus. Normal heart size. No pericardial effusions. Normal caliber thoracic aorta. Great vessels are patent. Mild aortic calcification. 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 Mediastinum/Nodes: Right hilar soft tissue or lymphadenopathy measuring 1.7 cm diameter. Consolidation and volume loss along the medial aspect of the mediastinum bilaterally. Left anterior paramediastinal soft tissue. This is likely post radiation change. Similar appearance to previous study. Esophagus is decompressed. Moderate prominence of paraesophageal and para-aortic lymph nodes at the crus of the diaphragm, measuring up to about 1 cm short axis dimension but more prominent than on prior study. Possible metastatic disease. Lungs/Pleura: Scarring in the medial lungs is likely post radiation change. Bronchiectasis and bronchial wall thickening. New areas of airspace disease in the posterior left upper lung and in the right lung base possibly representing  superimposed pneumonia. No pleural effusions. No pneumothorax. Musculoskeletal: No destructive bone lesions. Review of the MIP images confirms the above findings. CT ABDOMEN and PELVIS FINDINGS Hepatobiliary: No focal liver abnormality is seen. No gallstones, gallbladder wall thickening, or biliary dilatation. Pancreas: Unremarkable. No pancreatic ductal dilatation or surrounding inflammatory changes. Spleen: Normal in size without focal abnormality. Adrenals/Urinary Tract: Adrenal glands are unremarkable. Kidneys are normal, without renal calculi, focal lesion, or hydronephrosis. Bladder is unremarkable. Stomach/Bowel: Stomach, small bowel, and colon are mostly decompressed with scattered stool in the colon. No inflammatory changes are appreciated. The appendix is normal. Vascular/Lymphatic: Calcification of the aorta. No aneurysm. Moderately prominent retroperitoneal para-aortic lymphadenopathy with individual lymph nodes measuring up to about 9 mm short axis dimension. This is developing since previous study and may represent metastatic disease. Reproductive: Marked enlargement of the prostate gland, measuring 7 cm in diameter. Other: No free air or free fluid in the abdomen. Abdominal wall musculature appears intact. Musculoskeletal: No destructive bone lesions. Review of the MIP images confirms the above findings. IMPRESSION: 1. No evidence of significant pulmonary embolus. 2. Consolidation and volume loss along the medial aspect of the mediastinum bilaterally, likely post radiation change. Similar appearance to previous study. 3. New areas of airspace disease in the posterior left upper lung and in the right lung base possibly representing superimposed pneumonia. 4. Right hilar soft tissue or lymphadenopathy measuring 1.7 cm diameter, unchanged. 5. Moderate prominence of paraesophageal and para-aortic lymph nodes at the crus of the diaphragm, more prominent than on prior study. Possible metastatic disease. 6.  Moderately prominent retroperitoneal para-aortic lymph nodes measuring up to about 9 mm short axis dimension, developing since previous study and may represent metastatic disease. 7. Marked enlargement of the prostate gland. 8. 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 9. Aortic atherosclerosis. Aortic Atherosclerosis (ICD10-I70.0). Electronically Signed   By: Lucienne Capers M.D.   On: 10/28/2019 22:22   CT ABDOMEN PELVIS W CONTRAST  Result Date: 10/28/2019 CLINICAL DATA:  New onset chest pain and epigastric  pain. History of lung cancer. EXAM: CT ANGIOGRAPHY CHEST CT ABDOMEN AND PELVIS WITH CONTRAST TECHNIQUE: Multidetector CT imaging of the chest was performed using the standard protocol during bolus administration of intravenous contrast. Multiplanar CT image reconstructions and MIPs were obtained to evaluate the vascular anatomy. Multidetector CT imaging of the abdomen and pelvis was performed using the standard protocol during bolus administration of intravenous contrast. CONTRAST:  165mL OMNIPAQUE IOHEXOL 350 MG/ML SOLN COMPARISON:  CT chest 08/03/2019.  PET-CT 03/30/2019 FINDINGS: CTA CHEST FINDINGS Cardiovascular: Good opacification of the central and segmental pulmonary arteries. No focal filling defects. No evidence of significant pulmonary embolus. Normal heart size. No pericardial effusions. Normal caliber thoracic aorta. Great vessels are patent. Mild aortic calcification. 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 Mediastinum/Nodes: Right hilar soft tissue or lymphadenopathy measuring 1.7 cm diameter. Consolidation and volume loss along the medial aspect of the mediastinum bilaterally. Left anterior paramediastinal soft tissue. This is likely post radiation change. Similar appearance to previous study. Esophagus is decompressed. Moderate prominence of paraesophageal and para-aortic lymph nodes at the  crus of the diaphragm, measuring up to about 1 cm short axis dimension but more prominent than on prior study. Possible metastatic disease. Lungs/Pleura: Scarring in the medial lungs is likely post radiation change. Bronchiectasis and bronchial wall thickening. New areas of airspace disease in the posterior left upper lung and in the right lung base possibly representing superimposed pneumonia. No pleural effusions. No pneumothorax. Musculoskeletal: No destructive bone lesions. Review of the MIP images confirms the above findings. CT ABDOMEN and PELVIS FINDINGS Hepatobiliary: No focal liver abnormality is seen. No gallstones, gallbladder wall thickening, or biliary dilatation. Pancreas: Unremarkable. No pancreatic ductal dilatation or surrounding inflammatory changes. Spleen: Normal in size without focal abnormality. Adrenals/Urinary Tract: Adrenal glands are unremarkable. Kidneys are normal, without renal calculi, focal lesion, or hydronephrosis. Bladder is unremarkable. Stomach/Bowel: Stomach, small bowel, and colon are mostly decompressed with scattered stool in the colon. No inflammatory changes are appreciated. The appendix is normal. Vascular/Lymphatic: Calcification of the aorta. No aneurysm. Moderately prominent retroperitoneal para-aortic lymphadenopathy with individual lymph nodes measuring up to about 9 mm short axis dimension. This is developing since previous study and may represent metastatic disease. Reproductive: Marked enlargement of the prostate gland, measuring 7 cm in diameter. Other: No free air or free fluid in the abdomen. Abdominal wall musculature appears intact. Musculoskeletal: No destructive bone lesions. Review of the MIP images confirms the above findings. IMPRESSION: 1. No evidence of significant pulmonary embolus. 2. Consolidation and volume loss along the medial aspect of the mediastinum bilaterally, likely post radiation change. Similar appearance to previous study. 3. New areas of  airspace disease in the posterior left upper lung and in the right lung base possibly representing superimposed pneumonia. 4. Right hilar soft tissue or lymphadenopathy measuring 1.7 cm diameter, unchanged. 5. Moderate prominence of paraesophageal and para-aortic lymph nodes at the crus of the diaphragm, more prominent than on prior study. Possible metastatic disease. 6. Moderately prominent retroperitoneal para-aortic lymph nodes measuring up to about 9 mm short axis dimension, developing since previous study and may represent metastatic disease. 7. Marked enlargement of the prostate gland. 8. 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 9. Aortic atherosclerosis. Aortic Atherosclerosis (ICD10-I70.0). Electronically Signed   By: Lucienne Capers M.D.   On: 10/28/2019 22:22   DG Chest Portable 1 View  Result Date: 10/28/2019 CLINICAL DATA:  Shortness of breath.  History of lung cancer. EXAM: PORTABLE CHEST 1 VIEW COMPARISON:  CT chest dated July 04, 2019 FINDINGS: Persistent medial bilateral upper lung zone airspace opacification is noted which is likely related to prior treatment. There are parent new opacities in the left upper lung zone and right infrahilar region. The heart size appears stable. Aortic calcifications are noted. There is no pneumothorax. There is stable elevation of the left hemidiaphragm. No definite acute osseous abnormality. IMPRESSION: 1. Suggestion of new airspace opacities in the right infrahilar region and left upper lobe which may indicate developing infiltrates in the appropriate clinical setting. 2. Persistent post treatment changes in the bilateral upper lung zones. 3. Stable elevation of the left hemidiaphragm. 4. No convincing large pleural effusion. Electronically Signed   By: Constance Holster M.D.   On: 10/28/2019 18:10    Procedures Procedures (including critical care time)  Medications Ordered in ED Medications  albuterol  (VENTOLIN HFA) 108 (90 Base) MCG/ACT inhaler 4 puff (4 puffs Inhalation Given 10/28/19 1920)  sodium chloride (PF) 0.9 % injection (has no administration in time range)  enoxaparin (LOVENOX) injection 40 mg (40 mg Subcutaneous Given 10/29/19 0951)  cefTRIAXone (ROCEPHIN) 2 g in sodium chloride 0.9 % 100 mL IVPB (has no administration in time range)  azithromycin (ZITHROMAX) 500 mg in sodium chloride 0.9 % 250 mL IVPB (has no administration in time range)  nicotine polacrilex (NICORETTE) gum 4 mg (has no administration in time range)  tamsulosin (FLOMAX) capsule 0.4 mg (0.4 mg Oral Given 10/28/19 2351)  multivitamin with minerals tablet 1 tablet (1 tablet Oral Given 10/29/19 0951)  umeclidinium bromide (INCRUSE ELLIPTA) 62.5 MCG/INH 1 puff (1 puff Inhalation Not Given 10/29/19 0924)  oxyCODONE (Oxy IR/ROXICODONE) immediate release tablet 5-10 mg (has no administration in time range)  lactose free nutrition (BOOST PLUS) liquid 237 mL (has no administration in time range)  dextromethorphan-guaiFENesin (MUCINEX DM) 30-600 MG per 12 hr tablet 1 tablet (1 tablet Oral Given 10/29/19 0951)  sodium chloride 0.9 % bolus 1,000 mL (0 mLs Intravenous Stopped 10/28/19 2351)  iohexol (OMNIPAQUE) 350 MG/ML injection 100 mL (100 mLs Intravenous Contrast Given 10/28/19 2125)  cefTRIAXone (ROCEPHIN) 1 g in sodium chloride 0.9 % 100 mL IVPB (0 g Intravenous Stopped 10/28/19 2351)  azithromycin (ZITHROMAX) 500 mg in sodium chloride 0.9 % 250 mL IVPB (0 mg Intravenous Stopped 10/29/19 0132)  cefTRIAXone (ROCEPHIN) 1 g in sodium chloride 0.9 % 100 mL IVPB (0 g Intravenous Stopped 10/29/19 0014)    ED Course  I have reviewed the triage vital signs and the nursing notes.  Pertinent labs & imaging results that were available during my care of the patient were reviewed by me and considered in my medical decision making (see chart for details).  69 yo male w/ stage III nonsmall cell carcinoma of the lung, not on active chemo or  radiation therapy, presenting to ED with generalized weakness, diminished appetite, abdominal pain, and family concerns for failure to thrive.  DDx for symptoms include viral illness (including covid break-through, he is vaccinated) vs bacterial infection (inc PNA, cystitis) vs metastatic tumor burden (consider brain mets, abdominal mets) vs dehydration from poor PO intake vs other  I personally reviewed his labs showing largely unremarkable CMP (alb 2.9, mildly low), normal LFT, CBC with no leukocytosis (WBC 7.3), Hgb 11.5, normal lipase (21), Covid and flu negative, UA with >50 WBC, bacteria, and small leuks, negative nitrites.  Xray chest reviewed by myself showing questionable new right sided opacities.  [place]  obtained with no evident brain mets (although radiologist notes that MRI is more sensitive).  CTPE negative for PE but notes likely infiltrates, possible bilateral PNA.  CT abdomen shows para-aortic lymphadenopathy, which I suspect is likely the cause of his abdominal pain.  This unfortunately may represent worsening metastatic disease.  IV antibiotics ordered for CAP and UTI coverage.  1L NS bolus given here.  I doubt sepsis with this clinical picture.  Patient admitted to hospital for PNA, cystitis management.  Clinical Course as of Oct 28 1004  Sun Oct 28, 2019  1093 Error collecting CMP, needed to be resent   [MT]  2025 Initial labs fairly unremarkable - possible UTI.  Pt reporting worsening dypsnea - ordered albuterol, and CT PE to evaluate lung fields and for PE.  Will also check CT abdomen for intraabdominal mets, and CTH for metastasis as well.   [MT]  2227 IMPRESSION: 1. No evidence of significant pulmonary embolus. 2. Consolidation and volume loss along the medial aspect of the mediastinum bilaterally, likely post radiation change. Similar appearance to previous study. 3. New areas of airspace disease in the posterior left upper lung and in the right lung base possibly  representing superimposed pneumonia. 4. Right hilar soft tissue or lymphadenopathy measuring 1.7 cm diameter, unchanged. 5. Moderate prominence of paraesophageal and para-aortic lymph nodes at the crus of the diaphragm, more prominent than on prior study. Possible metastatic disease. 6. Moderately prominent retroperitoneal para-aortic lymph nodes measuring up to about 9 mm short axis dimension, developing since previous study and may represent metastatic disease. 7. Marked enlargement of the prostate gland. 8. 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 9. Aortic atherosclerosis.     [MT]  2229 Tx for UTI & PNA with Rocephin + Azithromycin.  Less suspicous of sepsis with no fever or leukocytosis.   [MT]  2243 Signed out to hospitalist   [MT]  2244 Patient and daughter updated regarding admission.   [MT]    Clinical Course User Index [MT] Wyvonnia Dusky, MD    Final Clinical Impression(s) / ED Diagnoses Final diagnoses:  Pneumonia of both lungs due to infectious organism, unspecified part of lung  Cystitis  Malignant neoplasm of lung, unspecified laterality, unspecified part of lung ([place])  Lymphadenopathy    Rx / DC Orders ED Discharge Orders    None       Wyvonnia Dusky, MD 10/29/19 1006

## 2019-10-29 ENCOUNTER — Encounter (HOSPITAL_COMMUNITY): Payer: Self-pay | Admitting: Internal Medicine

## 2019-10-29 DIAGNOSIS — R8271 Bacteriuria: Secondary | ICD-10-CM

## 2019-10-29 DIAGNOSIS — N3090 Cystitis, unspecified without hematuria: Secondary | ICD-10-CM

## 2019-10-29 DIAGNOSIS — J410 Simple chronic bronchitis: Secondary | ICD-10-CM

## 2019-10-29 DIAGNOSIS — K5900 Constipation, unspecified: Secondary | ICD-10-CM

## 2019-10-29 DIAGNOSIS — J42 Unspecified chronic bronchitis: Secondary | ICD-10-CM

## 2019-10-29 DIAGNOSIS — J189 Pneumonia, unspecified organism: Principal | ICD-10-CM

## 2019-10-29 DIAGNOSIS — I10 Essential (primary) hypertension: Secondary | ICD-10-CM

## 2019-10-29 DIAGNOSIS — C3412 Malignant neoplasm of upper lobe, left bronchus or lung: Secondary | ICD-10-CM

## 2019-10-29 LAB — CBC WITH DIFFERENTIAL/PLATELET
Abs Immature Granulocytes: 0.02 10*3/uL (ref 0.00–0.07)
Basophils Absolute: 0.1 10*3/uL (ref 0.0–0.1)
Basophils Relative: 1 %
Eosinophils Absolute: 0.5 10*3/uL (ref 0.0–0.5)
Eosinophils Relative: 7 %
HCT: 35.1 % — ABNORMAL LOW (ref 39.0–52.0)
Hemoglobin: 11.5 g/dL — ABNORMAL LOW (ref 13.0–17.0)
Immature Granulocytes: 0 %
Lymphocytes Relative: 11 %
Lymphs Abs: 0.8 10*3/uL (ref 0.7–4.0)
MCH: 27.4 pg (ref 26.0–34.0)
MCHC: 32.8 g/dL (ref 30.0–36.0)
MCV: 83.6 fL (ref 80.0–100.0)
Monocytes Absolute: 0.6 10*3/uL (ref 0.1–1.0)
Monocytes Relative: 8 %
Neutro Abs: 5.4 10*3/uL (ref 1.7–7.7)
Neutrophils Relative %: 73 %
Platelets: 423 10*3/uL — ABNORMAL HIGH (ref 150–400)
RBC: 4.2 MIL/uL — ABNORMAL LOW (ref 4.22–5.81)
RDW: 14 % (ref 11.5–15.5)
WBC: 7.3 10*3/uL (ref 4.0–10.5)
nRBC: 0 % (ref 0.0–0.2)

## 2019-10-29 LAB — COMPREHENSIVE METABOLIC PANEL
ALT: 16 U/L (ref 0–44)
AST: 16 U/L (ref 15–41)
Albumin: 2.9 g/dL — ABNORMAL LOW (ref 3.5–5.0)
Alkaline Phosphatase: 80 U/L (ref 38–126)
Anion gap: 11 (ref 5–15)
BUN: 9 mg/dL (ref 8–23)
CO2: 24 mmol/L (ref 22–32)
Calcium: 8.8 mg/dL — ABNORMAL LOW (ref 8.9–10.3)
Chloride: 102 mmol/L (ref 98–111)
Creatinine, Ser: 0.81 mg/dL (ref 0.61–1.24)
GFR calc Af Amer: >60 mL/min (ref >60)
GFR calc non Af Amer: >60 mL/min (ref >60)
Glucose, Bld: 96 mg/dL (ref 70–99)
Potassium: 4.4 mmol/L (ref 3.5–5.1)
Sodium: 137 mmol/L (ref 135–145)
Total Bilirubin: 0.4 mg/dL (ref 0.3–1.2)
Total Protein: 6.9 g/dL (ref 6.5–8.1)

## 2019-10-29 LAB — EXPECTORATED SPUTUM ASSESSMENT W GRAM STAIN, RFLX TO RESP C

## 2019-10-29 LAB — STREP PNEUMONIAE URINARY ANTIGEN: Strep Pneumo Urinary Antigen: NEGATIVE

## 2019-10-29 LAB — HIV ANTIBODY (ROUTINE TESTING W REFLEX): HIV Screen 4th Generation wRfx: NONREACTIVE

## 2019-10-29 MED ORDER — BOOST PLUS PO LIQD
237.0000 mL | Freq: Every day | ORAL | Status: DC
  Administered 2019-10-30 – 2019-10-31 (×2): 237 mL via ORAL
  Filled 2019-10-29 (×3): qty 237

## 2019-10-29 MED ORDER — DM-GUAIFENESIN ER 30-600 MG PO TB12
1.0000 | ORAL_TABLET | Freq: Two times a day (BID) | ORAL | Status: DC
  Administered 2019-10-29 – 2019-10-31 (×6): 1 via ORAL
  Filled 2019-10-29 (×6): qty 1

## 2019-10-29 MED ORDER — BUDESONIDE 0.25 MG/2ML IN SUSP: 0.2500 mg | Freq: Two times a day (BID) | RESPIRATORY_TRACT | Status: DC

## 2019-10-29 MED ORDER — ARFORMOTEROL TARTRATE 15 MCG/2ML IN NEBU
15.0000 ug | INHALATION_SOLUTION | Freq: Two times a day (BID) | RESPIRATORY_TRACT | Status: DC
  Administered 2019-10-29 – 2019-10-31 (×5): 15 ug via RESPIRATORY_TRACT
  Filled 2019-10-29 (×6): qty 2

## 2019-10-29 MED ORDER — IPRATROPIUM-ALBUTEROL 0.5-2.5 (3) MG/3ML IN SOLN
3.0000 mL | Freq: Four times a day (QID) | RESPIRATORY_TRACT | Status: DC
  Administered 2019-10-29 – 2019-10-30 (×5): 3 mL via RESPIRATORY_TRACT
  Filled 2019-10-29 (×5): qty 3

## 2019-10-29 MED ORDER — BUDESONIDE 0.25 MG/2ML IN SUSP
0.2500 mg | Freq: Two times a day (BID) | RESPIRATORY_TRACT | Status: DC
  Administered 2019-10-29 – 2019-10-31 (×4): 0.25 mg via RESPIRATORY_TRACT
  Filled 2019-10-29 (×4): qty 2

## 2019-10-29 MED ORDER — METHYLPREDNISOLONE SODIUM SUCC 125 MG IJ SOLR
60.0000 mg | Freq: Two times a day (BID) | INTRAMUSCULAR | Status: DC
  Administered 2019-10-29 – 2019-10-30 (×3): 60 mg via INTRAVENOUS
  Filled 2019-10-29 (×3): qty 2

## 2019-10-29 MED ORDER — ALBUTEROL SULFATE (2.5 MG/3ML) 0.083% IN NEBU: 2.5000 mg | INHALATION_SOLUTION | RESPIRATORY_TRACT | Status: DC | PRN

## 2019-10-29 MED ORDER — ENSURE ENLIVE PO LIQD
237.0000 mL | Freq: Two times a day (BID) | ORAL | Status: DC
  Administered 2019-10-29 – 2019-10-31 (×5): 237 mL via ORAL

## 2019-10-29 NOTE — ED Notes (Signed)
Sputum culture needing to be recollected.

## 2019-10-29 NOTE — H&P (Signed)
Evan [name] is an 69 y.o. male.   Chief Complaint: Failure to thrive, generalized weakness, falls, and progressively worsening epigastric abdominal pain, nausea, constipation.  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 He states that he has completed chemo and radiation months ago. He had been doing well living at home on his own until lately. The patient has not been eating for a couple of days. He has had nausea. He has been experiencing dysequilibrium, poor appetite, and now is unable to stand.  In the ED the patient was found to be tachypneic, tachycardic, and hypoxemic. CTA of the chest showed no PE, but did demonstrate airspace disease in the posterior let upper lung and right lung base likely representing superimposed pneumonia. There are right hilar soft tissue or LAD which are unchanged from previous. There is moderately increased prominence of paraesophageal and para-aortic lymph nodes that likely represent metastatic disease. There is compression or stenosis of the left subclavian vein with prominent collaterals in the neck. There is also evidence of asymptomatic bactiuria. The patient has no symptoms of dysuria. Urine culture is pending.CT head was negative for acute abnormalities. CT abdomen demosntrated retroperitoneal para-aortic LAD which may represent metastatic disease. He is also found to have an enlarged prostate.  The patient admits to fevers, chills, generalized weakness, and malaise. He states that he has no appetite. He is nauseated and now has not had a BM for 3 days. He is complaining of abdominal pain.   [facility] have been consulted for further evaluation and treatment.  Past Medical History:  Diagnosis Date  . Anxiety   . Complication of anesthesia    " sometimes I wake up during surgery "  . Depression   . Left renal mass 07/19/2016  . Lung cancer ([place]) dx'd 2010    NSCL CA  right  . Lung cancer ([place]) dx'd 2017   left    Past Surgical History:  Procedure Laterality Date  . AMPUTATION Right 05/21/2012   Procedure: AMPUTATION BELOW KNEE;  Surgeon: Newt Minion, MD;  Location: [facility];  Service: Orthopedics;  Laterality: Right;  . AMPUTATION Right 06/21/2012   Procedure: Revision AMPUTATION BELOW KNEE Right;  Surgeon: Newt Minion, MD;  Location: [facility];  Service: Orthopedics;  Laterality: Right;  Revision right Below Knee Amputation  . COLONOSCOPY W/ BIOPSIES AND POLYPECTOMY    . FOOT AMPUTATION Right    traumatic right lower extremity  . UPPER JAW     SURGERY FOR INFECTION    Family History  Problem Relation Age of Onset  . Heart disease Mother   . Diabetes Mother   . Prostate cancer Father    Social History:  reports that he quit smoking about 6 months ago. His smoking use included cigarettes. He has a 22.50 pack-year smoking history. He has never used smokeless tobacco. He reports that he does not drink alcohol and does not use drugs. (Not in a hospital admission)   Allergies:  Allergies  Allergen Reactions  . Chantix [Varenicline] Other (See Comments)    Weird dreams    Pertinent items noted in HPI and remainder of comprehensive ROS otherwise negative.   General appearance: alert, cooperative and moderate distress Head: Normocephalic, without obvious abnormality, atraumatic Eyes: conjunctivae/corneas clear. PERRL, EOM's intact. Fundi benign. Throat: lips, mucosa, and tongue normal; teeth and gums normal Neck: no adenopathy, no carotid  bruit, no JVD, supple, symmetrical, trachea midline and thyroid not enlarged, symmetric, no tenderness/mass/nodules Resp: Positive for increased work of breathing. No wheezes or rhonchi. Positive for rales. No tactile fremitus. Chest wall: no tenderness Cardio: regular rate and rhythm, S1, S2 normal, no murmur, click, rub or gallop GI: soft, non-tender; bowel sounds normal; no masses,  no  organomegaly Extremities: extremities normal, atraumatic, no cyanosis or edema Pulses: 2+ and symmetric Skin: Skin color, texture, turgor normal. No rashes or lesions Lymph nodes: Cervical, supraclavicular, and axillary nodes normal. Neurologic: Alert and oriented X 3, normal strength and tone. Normal symmetric reflexes. Normal coordination and gait  Results for orders placed or performed during the hospital encounter of 10/28/19 (from the past 48 hour(s))  Lipase, blood     Status: None   Collection Time: 10/28/19  3:44 PM  Result Value Ref Range   Lipase 21 11 - 51 U/L    Comment: Performed at [facility], [place] [address], [place], [place] [address]  Urinalysis, Routine w reflex microscopic Urine, Clean Catch     Status: Abnormal   Collection Time: 10/28/19  3:44 PM  Result Value Ref Range   Color, Urine YELLOW YELLOW   APPearance HAZY (A) CLEAR   Specific Gravity, Urine 1.020 1.005 - 1.030   pH 6.0 5.0 - 8.0   Glucose, UA NEGATIVE NEGATIVE mg/dL   Hgb urine dipstick SMALL (A) NEGATIVE   Bilirubin Urine NEGATIVE NEGATIVE   Ketones, ur NEGATIVE NEGATIVE mg/dL   Protein, ur 30 (A) NEGATIVE mg/dL   Nitrite NEGATIVE NEGATIVE   Leukocytes,Ua SMALL (A) NEGATIVE   RBC / HPF 6-10 0 - 5 RBC/hpf   WBC, UA >50 (H) 0 - 5 WBC/hpf   Bacteria, UA FEW (A) NONE SEEN   Squamous Epithelial / LPF 0-5 0 - 5   Mucus PRESENT     Comment: Performed at [facility] [place], [place] [address] [place], [place] [address]  CBC with Differential     Status: Abnormal   Collection Time: 10/28/19  5:40 PM  Result Value Ref Range   WBC 8.5 4.0 - 10.5 K/uL   RBC 4.76 4.22 - 5.81 MIL/uL   Hemoglobin 13.0 13.0 - 17.0 g/dL   HCT 40.1 39 - 52 %   MCV 84.2 80.0 - 100.0 fL   MCH 27.3 26.0 - 34.0 pg   MCHC 32.4 30.0 - 36.0 g/dL   RDW 13.9 11.5 - 15.5 %   Platelets 452 (H) 150 - 400 K/uL   nRBC 0.0 0.0 - 0.2 %   Neutrophils Relative % 66 %   Neutro Abs 5.6 1.7 - 7.7 K/uL    Lymphocytes Relative 14 %   Lymphs Abs 1.2 0.7 - 4.0 K/uL   Monocytes Relative 11 %   Monocytes Absolute 0.9 0 - 1 K/uL   Eosinophils Relative 8 %   Eosinophils Absolute 0.7 (H) 0 - 0 K/uL   Basophils Relative 1 %   Basophils Absolute 0.1 0 - 0 K/uL   Immature Granulocytes 0 %   Abs Immature Granulocytes 0.02 0.00 - 0.07 K/uL    Comment: Performed at [facility], [place] [address], [place], [place] [address]  Respiratory Panel by RT PCR (Flu A&B, Covid) - Nasopharyngeal Swab     Status: None   Collection Time: 10/28/19  5:41 PM   Specimen: Nasopharyngeal Swab  Result Value Ref Range   SARS Coronavirus 2 by RT PCR NEGATIVE NEGATIVE  Comment: (NOTE) SARS-CoV-2 target nucleic acids are NOT DETECTED.  The SARS-CoV-2 RNA is generally detectable in upper respiratoy specimens during the acute phase of infection. The lowest concentration of SARS-CoV-2 viral copies this assay can detect is 131 copies/mL. A negative result does not preclude SARS-Cov-2 infection and should not be used as the sole basis for treatment or other patient management decisions. A negative result may occur with  improper specimen collection/handling, submission of specimen other than nasopharyngeal swab, presence of viral mutation(s) within the areas targeted by this assay, and inadequate number of viral copies (<131 copies/mL). A negative result must be combined with clinical observations, patient history, and epidemiological information. The expected result is Negative.  Fact Sheet for Patients:  PinkCheek.be  Fact Sheet for Healthcare Providers:  GravelBags.it  This test is no t yet approved or cleared by the [place] FDA and  has been authorized for detection and/or diagnosis of SARS-CoV-2 by FDA under an Emergency Use Authorization (EUA). This EUA will remain  in effect (meaning this test can be used) for the duration of  the COVID-19 declaration under Section 564(b)(1) of the Act, 21 U.S.C. section 360bbb-3(b)(1), unless the authorization is terminated or revoked sooner.     Influenza A by PCR NEGATIVE NEGATIVE   Influenza B by PCR NEGATIVE NEGATIVE    Comment: (NOTE) The Xpert Xpress SARS-CoV-2/FLU/RSV assay is intended as an aid in  the diagnosis of influenza from Nasopharyngeal swab specimens and  should not be used as a sole basis for treatment. Nasal washings and  aspirates are unacceptable for Xpert Xpress SARS-CoV-2/FLU/RSV  testing.  Fact Sheet for Patients: PinkCheek.be  Fact Sheet for Healthcare Providers: GravelBags.it  This test is not yet approved or cleared by the [place] FDA and  has been authorized for detection and/or diagnosis of SARS-CoV-2 by  FDA under an Emergency Use Authorization (EUA). This EUA will remain  in effect (meaning this test can be used) for the duration of the  Covid-19 declaration under Section 564(b)(1) of the Act, 21  U.S.C. section 360bbb-3(b)(1), unless the authorization is  terminated or revoked. Performed at [facility], [place] [address] [place], [place] [address]   Comprehensive metabolic panel     Status: Abnormal   Collection Time: 10/28/19  7:35 PM  Result Value Ref Range   Sodium 136 135 - 145 mmol/L   Potassium 4.2 3.5 - 5.1 mmol/L   Chloride 100 98 - 111 mmol/L   CO2 25 22 - 32 mmol/L   Glucose, Bld 90 70 - 99 mg/dL    Comment: Glucose reference range applies only to samples taken after fasting for at least 8 hours.   BUN 10 8 - 23 mg/dL   Creatinine, Ser 0.70 0.61 - 1.24 mg/dL   Calcium 8.7 (L) 8.9 - 10.3 mg/dL   Total Protein 7.4 6.5 - 8.1 g/dL   Albumin 3.1 (L) 3.5 - 5.0 g/dL   AST 19 15 - 41 U/L   ALT 19 0 - 44 U/L   Alkaline Phosphatase 85 38 - 126 U/L   Total Bilirubin 0.4 0.3 - 1.2 mg/dL   GFR calc non Af Amer >60 >60 mL/min   GFR calc Af Amer >60  >60 mL/min   Anion gap 11 5 - 15    Comment: Performed at [facility], [place] [address], [place], [place] [address]   @[id number] @  Blood pressure 109/81, pulse (!) 106, temperature 98.1 F (36.7 C), temperature source Oral,  resp. rate 11, height 5' 5.5" (1.664 m), weight 54.4 kg, SpO2 98 %.   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  Pneumonia: The patient is receiving IV rocephin and azithromycin, mucinex. Blood cultures are pending.  COPD exacerbation; Due to pneumonia. The patient is receiving beta agonists. Continue incruse ellipta, and cough suppresant.  Failure to Thrive: Pt has not been eating, he has no appetite. He has been falling, and has been increasingly weak. He had been caring for himself independently until now.   Asymptomatic bactiuria: UA appears to indicate a mild UTI, but the patient has no symptoms. Urine cultures is pending.   Non-Small Cell CA of the lung stage IIIa: The patient states that he has completed his radiation and chemotherapy. Unfortunately CT of the chest and abdomen suggests the presence of metastatic disease.   I have seen and examined this patient myself. I have spent 68 minutes on his evaluation and care.  DVT Prophylaxis: Lovenox CODE STATUS: Full Code Family Communication: None available Disposition: The patient will be admitted as inpatient to a med/surg bed.  Status is: Inpatient  Remains inpatient appropriate because:Inpatient level of care appropriate due to severity of illness   Dispo: The patient is from: Home              Anticipated d/c is to: SNF              Anticipated d/c date is: 3 days               Patient currently is not medically stable to d/c.  Severity of Illness: The appropriate patient status for this patient is INPATIENT. Inpatient status is judged to be reasonable and necessary in order to provide the required intensity of service to ensure the patient's safety. The patient's presenting symptoms, physical exam findings, and initial radiographic and laboratory data in the context of their chronic comorbidities is felt to place them at high risk for further clinical deterioration. Furthermore, it is not anticipated that the patient will be medically stable for discharge from the hospital within 2 midnights of admission. The following factors support the patient status of inpatient.   " The patient's presenting symptoms include Weakness, hypoxia. " The worrisome physical exam findings include Increased work of breathing, cachexia, rales on the right. " The initial radiographic and laboratory data are worrisome because of Pneumonia, findings consistent with metastatic disease. " The chronic co-morbidities include Stage III non-small cell CA of the lung.   * I certify that at the point of admission it is my clinical judgment that the patient will require inpatient hospital care spanning beyond 2 midnights from the point of admission due to high intensity of service, high risk for further deterioration and high frequency of surveillance required.*  Edrian Melucci 10/29/2019, 1:43 AM

## 2019-10-29 NOTE — ED Notes (Signed)
On call provider X.Blount notified of pt retaining 458ml urine. In and out order to be placed.

## 2019-10-29 NOTE — Progress Notes (Signed)
[facility]                                                                              Patient Demographics  Evan [name], is a 69 y.o. male, DOB - 12/03/1950, QIH:[id number]  Admit date - 10/28/2019   Admitting Physician Ava Benny Lennert, DO  Outpatient Primary MD for the patient is Patient, No Pcp Per  Outpatient specialists:   LOS - 1  days   Medical records reviewed and are as summarized below:    Chief Complaint  Patient presents with  . Shortness of Breath  . Abdominal Pain       Brief summary   Patient is a 69 year old male with history of stage IIIa non-small cell lung CA, completed chemo and radiation, had been doing well at home on his own, presented with multiple complaints.  Patient has not been eating for the last couple of days, reported nausea, disequilibrium, poor appetite and unable to stand.  Patient reported fevers, chills, generalized weakness and malaise.  Also reported no BM for last 2 days, abdominal pain. In ED, patient was found to be tachypneic, tachycardic and hypoxemic.  Chest CTA showed stable airspace disease in the posterior left upper lung and right lung base likely representing superimposed pneumonia.  Moderately increased prominence of paraesophageal and para-aortic lymph nodes likely represent metastatic disease. Evidence of asymptomatic bacteriuria, no symptoms of dysuria.  Urine culture pending. CT head was negative for acute abnormalities, CT abdomen demonstrated retroperitoneal periaortic LAD which represents metastatic disease, enlarged prostate     Assessment & Plan    Principal Problem: Acute on chronic respiratory failure secondary to community-acquired pneumonia, possible postobstructive pneumonia due to lung CA -Currently sats 97 on 2 L, continue IV Rocephin and Zithromax -Wean O2 as tolerated, follow blood cultures  Active Problems:    COPD (chronic obstructive pulmonary disease) exacerbation (HCC) -Currently  wheezing at the time of my examination,  -Placed on scheduled bronchodilators, as needed albuterol, Pulmicort, Brovana  -Placed on IV Solu-Medrol 60 mg every 12 hours, will transition to oral prednisone in a.m.   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 Julien Nordmann, added in treatment team -PT OT evaluation    Constipation Placed on stool softeners    Asymptomatic bacteriuria Follow urine culture and sensitivities   Failure to thrive with moderate protein calorie malnutrition Estimated body mass index is 19.67 kg/m as calculated from the following:   Height as of this encounter: 5' 5.5" (1.664 m).   Weight as of this encounter: 54.4 kg.  Nutritional supplements, dietitian consult  Code Status: Full CODE STATUS DVT Prophylaxis:  Lovenox  Family Communication: Discussed all imaging results, lab results, explained to the patient    Disposition Plan:     Status is: Inpatient  Remains inpatient appropriate because:Inpatient level of care appropriate due to severity of illness   Dispo: The patient is from: Home              Anticipated d/c is to: Home  Anticipated d/c date is: 2 days              Patient currently is not medically stable to d/c.  Actively wheezing, not at baseline      Time Spent in minutes   35 minutes Procedures:  None  Consultants:   Oncology  Antimicrobials:   Anti-infectives (From admission, onward)   Start     Dose/Rate Route Frequency Ordered Stop   10/29/19 2200  cefTRIAXone (ROCEPHIN) 2 g in sodium chloride 0.9 % 100 mL IVPB        2 g 200 mL/hr over 30 Minutes Intravenous Every 24 hours 10/28/19 2314 11/03/19 2159   10/29/19 2200  azithromycin (ZITHROMAX) 500 mg in sodium chloride 0.9 % 250 mL IVPB        500 mg 250 mL/hr over 60 Minutes Intravenous Every 24 hours 10/28/19 2314 11/03/19 2159   10/28/19 2345  cefTRIAXone (ROCEPHIN) 1 g  in sodium chloride 0.9 % 100 mL IVPB        1 g 200 mL/hr over 30 Minutes Intravenous  Once 10/28/19 2332 10/29/19 0014   10/28/19 2230  cefTRIAXone (ROCEPHIN) 1 g in sodium chloride 0.9 % 100 mL IVPB        1 g 200 mL/hr over 30 Minutes Intravenous  Once 10/28/19 2229 10/28/19 2351   10/28/19 2230  azithromycin (ZITHROMAX) 500 mg in sodium chloride 0.9 % 250 mL IVPB        500 mg 250 mL/hr over 60 Minutes Intravenous  Once 10/28/19 2229 10/29/19 0132          Medications  Scheduled Meds: . dextromethorphan-guaiFENesin  1 tablet Oral BID  . enoxaparin (LOVENOX) injection  40 mg Subcutaneous Q24H  . lactose free nutrition  237 mL Oral Daily  . multivitamin with minerals  1 tablet Oral Daily  . tamsulosin  0.4 mg Oral QHS  . umeclidinium bromide  1 puff Inhalation Daily   Continuous Infusions: . azithromycin    . cefTRIAXone (ROCEPHIN)  IV     PRN Meds:.albuterol, nicotine polacrilex, oxyCODONE      Subjective:   Evan [name] was seen and examined today.  Feeling weak, afebrile.  Shortness of breath with wheezing bilaterally, not at baseline.  Patient denies dizziness,  abdominal pain, N/V/D/C, new weakness, numbess, tingling. No acute events overnight.    Objective:   Vitals:   10/29/19 0630 10/29/19 0800 10/29/19 0835 10/29/19 0922  BP:  114/72 116/87 102/80  Pulse: 82 89 97 92  Resp: (!) 27 (!) 22 (!) 26 16  Temp:   97.9 F (36.6 C) 98 F (36.7 C)  TempSrc:   Oral Oral  SpO2: 96% 95% 98% 100%  Weight:      Height:        Intake/Output Summary (Last 24 hours) at 10/29/2019 1218 Last data filed at 10/29/2019 0516 Gross per 24 hour  Intake --  Output 600 ml  Net -600 ml     Wt Readings from Last 3 Encounters:  10/28/19 54.4 kg  08/07/19 57.8 kg  06/04/19 58.3 kg     Exam  General: Alert and oriented x 3, NAD  Cardiovascular: S1 S2 auscultated, no murmurs, RRR  Respiratory: Bilateral wheezing  Gastrointestinal: Soft, nontender, nondistended,  + bowel sounds  Ext: no pedal edema bilaterally  Neuro: no new deficits  Musculoskeletal: No digital cyanosis, clubbing  Skin: No rashes  Psych: Normal affect and demeanor, alert and oriented x3    Data Reviewed:  I have personally reviewed following labs and imaging studies  Micro Results Recent Results (from the past 240 hour(s))  Respiratory Panel by RT PCR (Flu A&B, Covid) - Nasopharyngeal Swab     Status: None   Collection Time: 10/28/19  5:41 PM   Specimen: Nasopharyngeal Swab  Result Value Ref Range Status   SARS Coronavirus 2 by RT PCR NEGATIVE NEGATIVE Final    Comment: (NOTE) SARS-CoV-2 target nucleic acids are NOT DETECTED.  The SARS-CoV-2 RNA is generally detectable in upper respiratoy specimens during the acute phase of infection. The lowest concentration of SARS-CoV-2 viral copies this assay can detect is 131 copies/mL. A negative result does not preclude SARS-Cov-2 infection and should not be used as the sole basis for treatment or other patient management decisions. A negative result may occur with  improper specimen collection/handling, submission of specimen other than nasopharyngeal swab, presence of viral mutation(s) within the areas targeted by this assay, and inadequate number of viral copies (<131 copies/mL). A negative result must be combined with clinical observations, patient history, and epidemiological information. The expected result is Negative.  Fact Sheet for Patients:  PinkCheek.be  Fact Sheet for Healthcare Providers:  GravelBags.it  This test is no t yet approved or cleared by the [place] FDA and  has been authorized for detection and/or diagnosis of SARS-CoV-2 by FDA under an Emergency Use Authorization (EUA). This EUA will remain  in effect (meaning this test can be used) for the duration of the COVID-19 declaration under Section 564(b)(1) of the Act, 21 U.S.C. section  360bbb-3(b)(1), unless the authorization is terminated or revoked sooner.     Influenza A by PCR NEGATIVE NEGATIVE Final   Influenza B by PCR NEGATIVE NEGATIVE Final    Comment: (NOTE) The Xpert Xpress SARS-CoV-2/FLU/RSV assay is intended as an aid in  the diagnosis of influenza from Nasopharyngeal swab specimens and  should not be used as a sole basis for treatment. Nasal washings and  aspirates are unacceptable for Xpert Xpress SARS-CoV-2/FLU/RSV  testing.  Fact Sheet for Patients: PinkCheek.be  Fact Sheet for Healthcare Providers: GravelBags.it  This test is not yet approved or cleared by the [place] FDA and  has been authorized for detection and/or diagnosis of SARS-CoV-2 by  FDA under an Emergency Use Authorization (EUA). This EUA will remain  in effect (meaning this test can be used) for the duration of the  Covid-19 declaration under Section 564(b)(1) of the Act, 21  U.S.C. section 360bbb-3(b)(1), unless the authorization is  terminated or revoked. Performed at [facility], [place] [address], [place], [place] [address]   Sputum culture     Status: None   Collection Time: 10/29/19  1:31 AM   Specimen: Sputum  Result Value Ref Range Status   Specimen Description SPUTUM  Final   Special Requests NONE  Final   Sputum evaluation   Final    Sputum specimen not acceptable for testing.  Please recollect.   Performed at [facility], [place] [address], [place], [place] [address]    Report Status 10/29/2019 FINAL  Final    Radiology Reports CT Head Wo Contrast  Result Date: 10/28/2019 CLINICAL DATA:  Non-small cell lung cancer now with dizziness and balance difficulties. Headache. EXAM: CT HEAD WITHOUT CONTRAST TECHNIQUE: Contiguous axial images were obtained from the base of the skull through the vertex without intravenous contrast. COMPARISON:  MRI brain 08/29/2018 FINDINGS:  Brain: No mass effect or midline shift. No abnormal extra-axial fluid  collections. Gray-white matter junctions are distinct. Basal cisterns are not effaced. No acute intracranial hemorrhage. No specific evidence of an intracranial mass although contrast-enhanced MRI would be the most sensitive modality for detection of intracranial metastatic disease. Vascular: No hyperdense vessel or unexpected calcification. Skull: Calvarium appears intact.  No focal lesions identified. Sinuses/Orbits: Paranasal sinuses and mastoid air cells are clear. Other: None. IMPRESSION: No acute intracranial abnormalities. No specific evidence of an intracranial mass although contrast-enhanced MRI would be the most sensitive modality for detection of intracranial metastatic disease. Electronically Signed   By: Lucienne Capers M.D.   On: 10/28/2019 22:28   CT Angio Chest PE W and/or Wo Contrast  Result Date: 10/28/2019 CLINICAL DATA:  New onset chest pain and epigastric pain. History of lung cancer. EXAM: CT ANGIOGRAPHY CHEST CT ABDOMEN AND PELVIS WITH CONTRAST TECHNIQUE: Multidetector CT imaging of the chest was performed using the standard protocol during bolus administration of intravenous contrast. Multiplanar CT image reconstructions and MIPs were obtained to evaluate the vascular anatomy. Multidetector CT imaging of the abdomen and pelvis was performed using the standard protocol during bolus administration of intravenous contrast. CONTRAST:  148mL OMNIPAQUE IOHEXOL 350 MG/ML SOLN COMPARISON:  CT chest 08/03/2019.  PET-CT 03/30/2019 FINDINGS: CTA CHEST FINDINGS Cardiovascular: Good opacification of the central and segmental pulmonary arteries. No focal filling defects. No evidence of significant pulmonary embolus. Normal heart size. No pericardial effusions. Normal caliber thoracic aorta. Great vessels are patent. Mild aortic calcification. 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  Mediastinum/Nodes: Right hilar soft tissue or lymphadenopathy measuring 1.7 cm diameter. Consolidation and volume loss along the medial aspect of the mediastinum bilaterally. Left anterior paramediastinal soft tissue. This is likely post radiation change. Similar appearance to previous study. Esophagus is decompressed. Moderate prominence of paraesophageal and para-aortic lymph nodes at the crus of the diaphragm, measuring up to about 1 cm short axis dimension but more prominent than on prior study. Possible metastatic disease. Lungs/Pleura: Scarring in the medial lungs is likely post radiation change. Bronchiectasis and bronchial wall thickening. New areas of airspace disease in the posterior left upper lung and in the right lung base possibly representing superimposed pneumonia. No pleural effusions. No pneumothorax. Musculoskeletal: No destructive bone lesions. Review of the MIP images confirms the above findings. CT ABDOMEN and PELVIS FINDINGS Hepatobiliary: No focal liver abnormality is seen. No gallstones, gallbladder wall thickening, or biliary dilatation. Pancreas: Unremarkable. No pancreatic ductal dilatation or surrounding inflammatory changes. Spleen: Normal in size without focal abnormality. Adrenals/Urinary Tract: Adrenal glands are unremarkable. Kidneys are normal, without renal calculi, focal lesion, or hydronephrosis. Bladder is unremarkable. Stomach/Bowel: Stomach, small bowel, and colon are mostly decompressed with scattered stool in the colon. No inflammatory changes are appreciated. The appendix is normal. Vascular/Lymphatic: Calcification of the aorta. No aneurysm. Moderately prominent retroperitoneal para-aortic lymphadenopathy with individual lymph nodes measuring up to about 9 mm short axis dimension. This is developing since previous study and may represent metastatic disease. Reproductive: Marked enlargement of the prostate gland, measuring 7 cm in diameter. Other: No free air or free fluid  in the abdomen. Abdominal wall musculature appears intact. Musculoskeletal: No destructive bone lesions. Review of the MIP images confirms the above findings. IMPRESSION: 1. No evidence of significant pulmonary embolus. 2. Consolidation and volume loss along the medial aspect of the mediastinum bilaterally, likely post radiation change. Similar appearance to previous study. 3. New areas of airspace disease in the posterior left upper lung and in  the right lung base possibly representing superimposed pneumonia. 4. Right hilar soft tissue or lymphadenopathy measuring 1.7 cm diameter, unchanged. 5. Moderate prominence of paraesophageal and para-aortic lymph nodes at the crus of the diaphragm, more prominent than on prior study. Possible metastatic disease. 6. Moderately prominent retroperitoneal para-aortic lymph nodes measuring up to about 9 mm short axis dimension, developing since previous study and may represent metastatic disease. 7. Marked enlargement of the prostate gland. 8. 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 9. Aortic atherosclerosis. Aortic Atherosclerosis (ICD10-I70.0). Electronically Signed   By: Lucienne Capers M.D.   On: 10/28/2019 22:22   CT ABDOMEN PELVIS W CONTRAST  Result Date: 10/28/2019 CLINICAL DATA:  New onset chest pain and epigastric pain. History of lung cancer. EXAM: CT ANGIOGRAPHY CHEST CT ABDOMEN AND PELVIS WITH CONTRAST TECHNIQUE: Multidetector CT imaging of the chest was performed using the standard protocol during bolus administration of intravenous contrast. Multiplanar CT image reconstructions and MIPs were obtained to evaluate the vascular anatomy. Multidetector CT imaging of the abdomen and pelvis was performed using the standard protocol during bolus administration of intravenous contrast. CONTRAST:  170mL OMNIPAQUE IOHEXOL 350 MG/ML SOLN COMPARISON:  CT chest 08/03/2019.  PET-CT 03/30/2019 FINDINGS: CTA CHEST FINDINGS  Cardiovascular: Good opacification of the central and segmental pulmonary arteries. No focal filling defects. No evidence of significant pulmonary embolus. Normal heart size. No pericardial effusions. Normal caliber thoracic aorta. Great vessels are patent. Mild aortic calcification. 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 Mediastinum/Nodes: Right hilar soft tissue or lymphadenopathy measuring 1.7 cm diameter. Consolidation and volume loss along the medial aspect of the mediastinum bilaterally. Left anterior paramediastinal soft tissue. This is likely post radiation change. Similar appearance to previous study. Esophagus is decompressed. Moderate prominence of paraesophageal and para-aortic lymph nodes at the crus of the diaphragm, measuring up to about 1 cm short axis dimension but more prominent than on prior study. Possible metastatic disease. Lungs/Pleura: Scarring in the medial lungs is likely post radiation change. Bronchiectasis and bronchial wall thickening. New areas of airspace disease in the posterior left upper lung and in the right lung base possibly representing superimposed pneumonia. No pleural effusions. No pneumothorax. Musculoskeletal: No destructive bone lesions. Review of the MIP images confirms the above findings. CT ABDOMEN and PELVIS FINDINGS Hepatobiliary: No focal liver abnormality is seen. No gallstones, gallbladder wall thickening, or biliary dilatation. Pancreas: Unremarkable. No pancreatic ductal dilatation or surrounding inflammatory changes. Spleen: Normal in size without focal abnormality. Adrenals/Urinary Tract: Adrenal glands are unremarkable. Kidneys are normal, without renal calculi, focal lesion, or hydronephrosis. Bladder is unremarkable. Stomach/Bowel: Stomach, small bowel, and colon are mostly decompressed with scattered stool in the colon. No inflammatory changes are appreciated. The appendix is normal. Vascular/Lymphatic:  Calcification of the aorta. No aneurysm. Moderately prominent retroperitoneal para-aortic lymphadenopathy with individual lymph nodes measuring up to about 9 mm short axis dimension. This is developing since previous study and may represent metastatic disease. Reproductive: Marked enlargement of the prostate gland, measuring 7 cm in diameter. Other: No free air or free fluid in the abdomen. Abdominal wall musculature appears intact. Musculoskeletal: No destructive bone lesions. Review of the MIP images confirms the above findings. IMPRESSION: 1. No evidence of significant pulmonary embolus. 2. Consolidation and volume loss along the medial aspect of the mediastinum bilaterally, likely post radiation change. Similar appearance to previous study. 3. New areas of airspace disease in the posterior left upper  lung and in the right lung base possibly representing superimposed pneumonia. 4. Right hilar soft tissue or lymphadenopathy measuring 1.7 cm diameter, unchanged. 5. Moderate prominence of paraesophageal and para-aortic lymph nodes at the crus of the diaphragm, more prominent than on prior study. Possible metastatic disease. 6. Moderately prominent retroperitoneal para-aortic lymph nodes measuring up to about 9 mm short axis dimension, developing since previous study and may represent metastatic disease. 7. Marked enlargement of the prostate gland. 8. 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 9. Aortic atherosclerosis. Aortic Atherosclerosis (ICD10-I70.0). Electronically Signed   By: Lucienne Capers M.D.   On: 10/28/2019 22:22   DG Chest Portable 1 View  Result Date: 10/28/2019 CLINICAL DATA:  Shortness of breath.  History of lung cancer. EXAM: PORTABLE CHEST 1 VIEW COMPARISON:  CT chest dated July 04, 2019 FINDINGS: Persistent medial bilateral upper lung zone airspace opacification is noted which is likely related to prior treatment. There are parent new  opacities in the left upper lung zone and right infrahilar region. The heart size appears stable. Aortic calcifications are noted. There is no pneumothorax. There is stable elevation of the left hemidiaphragm. No definite acute osseous abnormality. IMPRESSION: 1. Suggestion of new airspace opacities in the right infrahilar region and left upper lobe which may indicate developing infiltrates in the appropriate clinical setting. 2. Persistent post treatment changes in the bilateral upper lung zones. 3. Stable elevation of the left hemidiaphragm. 4. No convincing large pleural effusion. Electronically Signed   By: Constance Holster M.D.   On: 10/28/2019 18:10    Lab Data:  CBC: Recent Labs  Lab 10/28/19 1740 10/29/19 0401  WBC 8.5 7.3  NEUTROABS 5.6 5.4  HGB 13.0 11.5*  HCT 40.1 35.1*  MCV 84.2 83.6  PLT 452* 539*   Basic Metabolic Panel: Recent Labs  Lab 10/28/19 1935 10/29/19 0401  NA 136 137  K 4.2 4.4  CL 100 102  CO2 25 24  GLUCOSE 90 96  BUN 10 9  CREATININE 0.70 0.81  CALCIUM 8.7* 8.8*   GFR: Estimated Creatinine Clearance: 67.2 mL/min (by C-G formula based on SCr of 0.81 mg/dL). Liver Function Tests: Recent Labs  Lab 10/28/19 1935 10/29/19 0401  AST 19 16  ALT 19 16  ALKPHOS 85 80  BILITOT 0.4 0.4  PROT 7.4 6.9  ALBUMIN 3.1* 2.9*   Recent Labs  Lab 10/28/19 1544  LIPASE 21   No results for input(s): AMMONIA in the last 168 hours. Coagulation Profile: No results for input(s): INR, PROTIME in the last 168 hours. Cardiac Enzymes: No results for input(s): CKTOTAL, CKMB, CKMBINDEX, TROPONINI in the last 168 hours. BNP (last 3 results) No results for input(s): PROBNP in the last 8760 hours. HbA1C: No results for input(s): HGBA1C in the last 72 hours. CBG: No results for input(s): GLUCAP in the last 168 hours. Lipid Profile: No results for input(s): CHOL, HDL, LDLCALC, TRIG, CHOLHDL, LDLDIRECT in the last 72 hours. Thyroid Function Tests: No results for  input(s): TSH, T4TOTAL, FREET4, T3FREE, THYROIDAB in the last 72 hours. Anemia Panel: No results for input(s): VITAMINB12, FOLATE, FERRITIN, TIBC, IRON, RETICCTPCT in the last 72 hours. Urine analysis:    Component Value Date/Time   COLORURINE YELLOW 10/28/2019 1544   APPEARANCEUR HAZY (A) 10/28/2019 1544   LABSPEC 1.020 10/28/2019 1544   PHURINE 6.0 10/28/2019 1544   GLUCOSEU NEGATIVE 10/28/2019 1544   HGBUR SMALL (A) 10/28/2019 [place] 10/28/2019 1544  KETONESUR NEGATIVE 10/28/2019 1544   PROTEINUR 30 (A) 10/28/2019 1544   NITRITE NEGATIVE 10/28/2019 1544   LEUKOCYTESUR SMALL (A) 10/28/2019 1544     Cordelia Bessinger M.D. [facility] 10/29/2019, 12:18 PM   Call night coverage person covering after 7pm

## 2019-10-30 LAB — BASIC METABOLIC PANEL
Anion gap: 8 (ref 5–15)
BUN: 11 mg/dL (ref 8–23)
CO2: 25 mmol/L (ref 22–32)
Calcium: 8.8 mg/dL — ABNORMAL LOW (ref 8.9–10.3)
Chloride: 99 mmol/L (ref 98–111)
Creatinine, Ser: 0.68 mg/dL (ref 0.61–1.24)
GFR calc Af Amer: >60 mL/min (ref >60)
GFR calc non Af Amer: >60 mL/min (ref >60)
Glucose, Bld: 150 mg/dL — ABNORMAL HIGH (ref 70–99)
Potassium: 4.4 mmol/L (ref 3.5–5.1)
Sodium: 132 mmol/L — ABNORMAL LOW (ref 135–145)

## 2019-10-30 LAB — LEGIONELLA PNEUMOPHILA SEROGP 1 UR AG: L. pneumophila Serogp 1 Ur Ag: NEGATIVE

## 2019-10-30 MED ORDER — METHYLPREDNISOLONE SODIUM SUCC 40 MG IJ SOLR
40.0000 mg | Freq: Two times a day (BID) | INTRAMUSCULAR | Status: DC
  Administered 2019-10-30 – 2019-10-31 (×2): 40 mg via INTRAVENOUS
  Filled 2019-10-30 (×2): qty 1

## 2019-10-30 MED ORDER — INFLUENZA VAC SPLIT QUAD 0.5 ML IM SUSY
0.5000 mL | PREFILLED_SYRINGE | INTRAMUSCULAR | Status: AC
  Administered 2019-10-31: 0.5 mL via INTRAMUSCULAR
  Filled 2019-10-30: qty 0.5

## 2019-10-30 MED ORDER — IPRATROPIUM-ALBUTEROL 0.5-2.5 (3) MG/3ML IN SOLN
3.0000 mL | Freq: Three times a day (TID) | RESPIRATORY_TRACT | Status: DC
  Administered 2019-10-30 – 2019-10-31 (×3): 3 mL via RESPIRATORY_TRACT
  Filled 2019-10-30 (×3): qty 3

## 2019-10-30 NOTE — Evaluation (Signed)
Physical Therapy Evaluation Patient Details Name: Evan [name] MRN: [id number] DOB: 05-Apr-1950 Today's Date: 10/30/2019   History of Present Illness  Patient is a 69 year old male with history of stage IIIa non-small cell lung CA, completed chemo and radiation, had been doing well at home on his own, presented with multiple complaints.  Patient has not been eating for the last couple of days, reported nausea, disequilibrium, poor appetite and unable to stand.  Patient reported fevers, chills, generalized weakness and malaise.   Clinical Impression  Evan [name] is 69 y.o. male admitted with above HPI and diagnosis. Patient is currently limited by functional impairments below (see PT problem list). Patient lives alone and is independent with [facility] and wheelchair for mobility at baseline. He currently requires min assist for be mobility, and transfers and gait with RW. Pt amb on RA and sats remained 94% or greater. Patient will benefit from continued skilled PT interventions to address impairments and progress independence with mobility, recommending HHPT with initial 24/7 assist/supervision from family which pt reports is available. Acute PT will follow and progress as able.       10/30/19 1200  PT Visit Information  Last PT Received On 10/30/19  Assistance Needed +1  History of Present Illness Patient is a 69 year old male with history of stage IIIa non-small cell lung CA, completed chemo and radiation, had been doing well at home on his own, presented with multiple complaints.  Patient has not been eating for the last couple of days, reported nausea, disequilibrium, poor appetite and unable to stand.  Patient reported fevers, chills, generalized weakness and malaise.    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  Prior Function  Level of Independence Independent with assistive device(s)  Communication  Communication No difficulties  Pain Assessment  Pain Assessment No/denies pain  Cognition  Arousal/Alertness Awake/alert  Behavior During Therapy WFL for tasks assessed/performed  Overall Cognitive Status Within Functional Limits for tasks assessed  Upper Extremity Assessment  Upper Extremity Assessment Generalized weakness;Defer to OT evaluation  Lower Extremity Assessment  Lower Extremity Assessment Generalized weakness;RLE deficits/detail  RLE Deficits / Details skin intact on residual limb  Cervical / Trunk Assessment  Cervical / Trunk Assessment Normal  Bed Mobility  Overal bed mobility Needs Assistance  Bed Mobility Supine to Sit  Supine to sit Min assist;HOB elevated  General bed mobility comments light min assist and cues for sequenicng to pivot to EOB and bring LE's off EOB.  Transfers  Overall transfer level Needs assistance  Equipment used Rolling walker (2 wheeled)  Transfers Sit to/from Stand  Sit to Stand Min assist  General transfer comment cues for technique with RW, assist for power up and to steady in standing.   Ambulation/Gait  Ambulation/Gait assistance Min assist  Gait Distance (Feet) 65 Feet  Assistive device Rolling walker (2 wheeled)  Gait Pattern/deviations Step-through pattern;Trunk flexed;Decreased  step length - left  General Gait Details pt required cues for safe step pattern with RW and safe  proximity. min assist throughout to prevent LOB. Pt amb on RA and sats 94% or better. HR reached max of 128 bpm and decresed with rest.   Gait velocity decr  Balance  Overall balance assessment Needs assistance  Sitting-balance support Feet supported  Sitting balance-Leahy Scale Good  Standing balance support During functional activity;Bilateral upper extremity supported  Standing balance-Leahy Scale Poor  Standing balance comment reliant on external support  PT - End of Session  Equipment Utilized During Treatment Gait belt  Activity Tolerance Patient tolerated treatment well  Patient left in chair;with call bell/phone within reach;with chair alarm set;with family/visitor present  Nurse Communication Mobility status  PT Assessment  PT Recommendation/Assessment Patient needs continued PT services  PT Visit Diagnosis Other abnormalities of gait and mobility (R26.89);Unsteadiness on feet (R26.81);Muscle weakness (generalized) (M62.81);Difficulty in walking, not elsewhere classified (R26.2)  PT Problem List Decreased strength;Decreased activity tolerance;Decreased balance;Decreased mobility;Decreased knowledge of use of DME  PT Plan  PT Frequency (ACUTE ONLY) 7X/week  PT Treatment/Interventions (ACUTE ONLY) DME instruction;Stair training;Gait training;Functional mobility training;Therapeutic activities;Therapeutic exercise;Balance training;Patient/family education  AM-PAC PT "6 Clicks" Mobility Outcome Measure (Version 2)  Help needed turning from your back to your side while in a flat bed without using bedrails? 4  Help needed moving from lying on your back to sitting on the side of a flat bed without using bedrails? 3  Help needed moving to and from a bed to a chair (including a wheelchair)? 3  Help needed standing up from a chair using your arms (e.g., wheelchair or bedside chair)? 3  Help needed to walk in hospital room? 3  Help needed climbing 3-5 steps with a railing?  2  6 Click Score  18  Consider Recommendation of Discharge To: Home with [facility]  PT Recommendation  Follow Up Recommendations Home health PT;Supervision/Assistance - 24 hour  PT equipment Rolling walker with 5" wheels  Individuals Consulted  Consulted and Agree with Results and Recommendations Patient  Acute Rehab PT Goals  Patient Stated Goal return home and get stronger  PT Goal Formulation With patient/family  Time For Goal Achievement 11/13/19  Potential to Achieve Goals Good  PT Time Calculation  PT Start Time (ACUTE ONLY) 1209  PT Stop Time (ACUTE ONLY) 1240  PT Time Calculation (min) (ACUTE ONLY) 31 min  PT General Charges  $$ ACUTE PT VISIT 1 Visit  PT Evaluation  $PT Eval Moderate Complexity 1 Mod  PT Treatments  $Gait Training 8-22 mins  Written Expression  Dominant Hand Right    Verner Mould, DPT Acute Rehabilitation Services  Office [phone] Pager [phone]  10/30/2019 4:06 PM

## 2019-10-30 NOTE — Progress Notes (Signed)
[facility]                                                                              Patient Demographics  Evan [name], is a 69 y.o. male, DOB - [birth year], URK:[id number]  Admit date - 10/28/2019   Admitting Physician Ava Benny Lennert, DO  Outpatient Primary MD for the patient is Patient, No Pcp Per  Outpatient specialists:   LOS - 2  days   Medical records reviewed and are as summarized below:    Chief Complaint  Patient presents with   Shortness of Breath   Abdominal Pain       Brief summary   Patient is a 70 year old male with history of stage IIIa non-small cell lung CA, completed chemo and radiation, had been doing well at home on his own, presented with multiple complaints.  Patient has not been eating for the last couple of days, reported nausea, disequilibrium, poor appetite and unable to stand.  Patient reported fevers, chills, generalized weakness and malaise.  Also reported no BM for last 2 days, abdominal pain. In ED, patient was found to be tachypneic, tachycardic and hypoxemic.  Chest CTA showed stable airspace disease in the posterior left upper lung and right lung base likely representing superimposed pneumonia.  Moderately increased prominence of paraesophageal and para-aortic lymph nodes likely represent metastatic disease. Evidence of asymptomatic bacteriuria, no symptoms of dysuria.  Urine culture pending. CT head was negative for acute abnormalities, CT abdomen demonstrated retroperitoneal periaortic LAD which represents metastatic disease, enlarged prostate     Assessment & Plan    Principal Problem: Acute on chronic respiratory failure secondary to community-acquired pneumonia, possible postobstructive pneumonia due to lung CA -O2 sats 93%, continue IV Rocephin, Zithromax -Wean O2 as tolerated -Will need home O2 evaluation prior to discharge, still has some shortness of breath on minimal exertion -Urine strep antigen  negative  Active Problems:    COPD (chronic obstructive pulmonary disease) exacerbation (HCC) -Shortness of breath, wheezing improving -Placed on scheduled bronchodilators, as needed albuterol, Pulmicort, Brovana  -Taper IV Solu-Medrol to 40 mg every 12 hours, transition to oral prednisone in a.m.  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 Julien Nordmann, added in treatment team -Needs PT OT evaluation, home O2 evaluation    Constipation Placed on stool softeners   Failure to thrive with moderate protein calorie malnutrition Estimated body mass index is 19.67 kg/m as calculated from the following:   Height as of this encounter: 5' 5.5" (1.664 m).   Weight as of this encounter: 54.4 kg.  Nutritional supplements, dietitian consult  Code Status: Full CODE STATUS DVT Prophylaxis:  Lovenox  Family Communication: Discussed all imaging results, lab results, explained to the patient    Disposition Plan:     Status is: Inpatient  Remains inpatient appropriate because:Inpatient level of care appropriate due to severity of illness   Dispo: The patient is from: Home  Anticipated d/c is to: Home              Anticipated d/c date is: 2 days              Patient currently is not medically stable to d/c.  Hopefully next 24 to 48 hours if continues to improve      Time Spent in minutes   35 minutes Procedures:  None  Consultants:   Oncology  Antimicrobials:   Anti-infectives (From admission, onward)   Start     Dose/Rate Route Frequency Ordered Stop   10/29/19 2200  cefTRIAXone (ROCEPHIN) 2 g in sodium chloride 0.9 % 100 mL IVPB        2 g 200 mL/hr over 30 Minutes Intravenous Every 24 hours 10/28/19 2314 11/03/19 2159   10/29/19 2200   azithromycin (ZITHROMAX) 500 mg in sodium chloride 0.9 % 250 mL IVPB        500 mg 250 mL/hr over 60 Minutes Intravenous Every 24 hours 10/28/19 2314 11/03/19 2159   10/28/19 2345  cefTRIAXone (ROCEPHIN) 1 g in sodium chloride 0.9 % 100 mL IVPB        1 g 200 mL/hr over 30 Minutes Intravenous  Once 10/28/19 2332 10/29/19 0014   10/28/19 2230  cefTRIAXone (ROCEPHIN) 1 g in sodium chloride 0.9 % 100 mL IVPB        1 g 200 mL/hr over 30 Minutes Intravenous  Once 10/28/19 2229 10/28/19 2351   10/28/19 2230  azithromycin (ZITHROMAX) 500 mg in sodium chloride 0.9 % 250 mL IVPB        500 mg 250 mL/hr over 60 Minutes Intravenous  Once 10/28/19 2229 10/29/19 0132         Medications  Scheduled Meds:  arformoterol  15 mcg Nebulization BID   budesonide (PULMICORT) nebulizer solution  0.25 mg Nebulization BID   dextromethorphan-guaiFENesin  1 tablet Oral BID   enoxaparin (LOVENOX) injection  40 mg Subcutaneous Q24H   feeding supplement (ENSURE ENLIVE)  237 mL Oral BID BM   [START ON 11/01/2019] influenza vac split quadrivalent PF  0.5 mL Intramuscular Tomorrow-1000   ipratropium-albuterol  3 mL Nebulization TID   lactose free nutrition  237 mL Oral Daily   methylPREDNISolone (SOLU-MEDROL) injection  60 mg Intravenous Q12H   multivitamin with minerals  1 tablet Oral Daily   tamsulosin  0.4 mg Oral QHS   Continuous Infusions:  azithromycin Stopped (10/30/19 0129)   cefTRIAXone (ROCEPHIN)  IV Stopped (10/29/19 2357)   PRN Meds:.albuterol, nicotine polacrilex, oxyCODONE      Subjective:   Evan [name] was seen and examined today.  Feels weak, still has shortness of breath on minimal exertion and conversation.  Wheezing is improving.   Patient denies dizziness,  abdominal pain, N/V/D/C, new weakness, numbess, tingling.  No fevers  Objective:   Vitals:   10/30/19 0537 10/30/19 0735 10/30/19 1115 10/30/19 1442  BP: 131/77   107/75  Pulse: 81   95  Resp: 17   20  Temp:  97.7 F (36.5 C)   (!) 97.5 F (36.4 C)  TempSrc: Oral   Oral  SpO2: 91% 91% 96% 93%  Weight:      Height:        Intake/Output Summary (Last 24 hours) at 10/30/2019 1450 Last data filed at 10/30/2019 0900 Gross per 24 hour  Intake 590 ml  Output 575 ml  Net 15 ml     Wt Readings from Last 3 Encounters:  10/28/19  54.4 kg  08/07/19 57.8 kg  06/04/19 58.3 kg   Physical Exam  General: Alert and oriented x 3, NAD  Cardiovascular: S1 S2 clear, RRR. No pedal edema b/l  Respiratory: Diminished breath sound at the bases with scattered wheezing  Gastrointestinal: Soft, nontender, nondistended, NBS  Ext: no pedal edema bilaterally  Neuro: no new deficits  Musculoskeletal: No cyanosis, clubbing  Skin: No rashes  Psych: Normal affect and demeanor, alert and oriented x3     Data Reviewed:  I have personally reviewed following labs and imaging studies  Micro Results Recent Results (from the past 240 hour(s))  Urine culture     Status: Abnormal (Preliminary result)   Collection Time: 10/28/19  3:44 PM   Specimen: Urine, Random  Result Value Ref Range Status   Specimen Description   Final    URINE, RANDOM Performed at [facility] [address], [place], [place] [address]    Special Requests   Final    NONE Performed at [facility], [place] [address] [place],  [address]    Culture (A)  Final    >=100,000 COLONIES/mL ACINETOBACTER BAUMANNII SUSCEPTIBILITIES TO FOLLOW Performed at [facility], [place] [address] [place],  [address]    Report Status PENDING  Incomplete  Respiratory Panel by RT PCR (Flu A&B, Covid) - Nasopharyngeal Swab     Status: None   Collection Time: 10/28/19  5:41 PM   Specimen: Nasopharyngeal Swab  Result Value Ref Range Status   SARS Coronavirus 2 by RT PCR NEGATIVE NEGATIVE Final    Comment: (NOTE) SARS-CoV-2 target nucleic acids are NOT DETECTED.  The SARS-CoV-2 RNA is generally  detectable in upper respiratoy specimens during the acute phase of infection. The lowest concentration of SARS-CoV-2 viral copies this assay can detect is 131 copies/mL. A negative result does not preclude SARS-Cov-2 infection and should not be used as the sole basis for treatment or other patient management decisions. A negative result may occur with  improper specimen collection/handling, submission of specimen other than nasopharyngeal swab, presence of viral mutation(s) within the areas targeted by this assay, and inadequate number of viral copies (<131 copies/mL). A negative result must be combined with clinical observations, patient history, and epidemiological information. The expected result is Negative.  Fact Sheet for Patients:  PinkCheek.be  Fact Sheet for Healthcare Providers:  GravelBags.it  This test is no t yet approved or cleared by the [place] FDA and  has been authorized for detection and/or diagnosis of SARS-CoV-2 by FDA under an Emergency Use Authorization (EUA). This EUA will remain  in effect (meaning this test can be used) for the duration of the COVID-19 declaration under Section 564(b)(1) of the Act, 21 U.S.C. section 360bbb-3(b)(1), unless the authorization is terminated or revoked sooner.     Influenza A by PCR NEGATIVE NEGATIVE Final   Influenza B by PCR NEGATIVE NEGATIVE Final    Comment: (NOTE) The Xpert Xpress SARS-CoV-2/FLU/RSV assay is intended as an aid in  the diagnosis of influenza from Nasopharyngeal swab specimens and  should not be used as a sole basis for treatment. Nasal washings and  aspirates are unacceptable for Xpert Xpress SARS-CoV-2/FLU/RSV  testing.  Fact Sheet for Patients: PinkCheek.be  Fact Sheet for Healthcare Providers: GravelBags.it  This test is not yet approved or cleared by the [place] FDA and   has been authorized for detection and/or diagnosis of SARS-CoV-2 by  FDA under an Emergency Use Authorization (EUA). This  EUA will remain  in effect (meaning this test can be used) for the duration of the  Covid-19 declaration under Section 564(b)(1) of the Act, 21  U.S.C. section 360bbb-3(b)(1), unless the authorization is  terminated or revoked. Performed at [facility], [place] [address] [place], [place] [address]   Sputum culture     Status: None   Collection Time: 10/29/19  1:31 AM   Specimen: Sputum  Result Value Ref Range Status   Specimen Description SPUTUM  Final   Special Requests NONE  Final   Sputum evaluation   Final    Sputum specimen not acceptable for testing.  Please recollect.   Performed at [facility], [place] [address] [place], [place] [address]    Report Status 10/29/2019 FINAL  Final    Radiology Reports CT Head Wo Contrast  Result Date: 10/28/2019 CLINICAL DATA:  Non-small cell lung cancer now with dizziness and balance difficulties. Headache. EXAM: CT HEAD WITHOUT CONTRAST TECHNIQUE: Contiguous axial images were obtained from the base of the skull through the vertex without intravenous contrast. COMPARISON:  MRI brain 08/29/2018 FINDINGS: Brain: No mass effect or midline shift. No abnormal extra-axial fluid collections. Gray-white matter junctions are distinct. Basal cisterns are not effaced. No acute intracranial hemorrhage. No specific evidence of an intracranial mass although contrast-enhanced MRI would be the most sensitive modality for detection of intracranial metastatic disease. Vascular: No hyperdense vessel or unexpected calcification. Skull: Calvarium appears intact.  No focal lesions identified. Sinuses/Orbits: Paranasal sinuses and mastoid air cells are clear. Other: None. IMPRESSION: No acute intracranial abnormalities. No specific evidence of an intracranial mass although contrast-enhanced MRI would be the most  sensitive modality for detection of intracranial metastatic disease. Electronically Signed   By: Lucienne Capers M.D.   On: 10/28/2019 22:28   CT Angio Chest PE W and/or Wo Contrast  Result Date: 10/28/2019 CLINICAL DATA:  New onset chest pain and epigastric pain. History of lung cancer. EXAM: CT ANGIOGRAPHY CHEST CT ABDOMEN AND PELVIS WITH CONTRAST TECHNIQUE: Multidetector CT imaging of the chest was performed using the standard protocol during bolus administration of intravenous contrast. Multiplanar CT image reconstructions and MIPs were obtained to evaluate the vascular anatomy. Multidetector CT imaging of the abdomen and pelvis was performed using the standard protocol during bolus administration of intravenous contrast. CONTRAST:  112mL OMNIPAQUE IOHEXOL 350 MG/ML SOLN COMPARISON:  CT chest 08/03/2019.  PET-CT 03/30/2019 FINDINGS: CTA CHEST FINDINGS Cardiovascular: Good opacification of the central and segmental pulmonary arteries. No focal filling defects. No evidence of significant pulmonary embolus. Normal heart size. No pericardial effusions. Normal caliber thoracic aorta. Great vessels are patent. Mild aortic calcification. 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 Mediastinum/Nodes: Right hilar soft tissue or lymphadenopathy measuring 1.7 cm diameter. Consolidation and volume loss along the medial aspect of the mediastinum bilaterally. Left anterior paramediastinal soft tissue. This is likely post radiation change. Similar appearance to previous study. Esophagus is decompressed. Moderate prominence of paraesophageal and para-aortic lymph nodes at the crus of the diaphragm, measuring up to about 1 cm short axis dimension but more prominent than on prior study. Possible metastatic disease. Lungs/Pleura: Scarring in the medial lungs is likely post radiation change. Bronchiectasis and bronchial wall thickening. New areas of airspace disease in the  posterior left upper lung and in the right lung base possibly representing superimposed pneumonia. No pleural effusions. No pneumothorax. Musculoskeletal: No destructive bone lesions. Review of the MIP images confirms  the above findings. CT ABDOMEN and PELVIS FINDINGS Hepatobiliary: No focal liver abnormality is seen. No gallstones, gallbladder wall thickening, or biliary dilatation. Pancreas: Unremarkable. No pancreatic ductal dilatation or surrounding inflammatory changes. Spleen: Normal in size without focal abnormality. Adrenals/Urinary Tract: Adrenal glands are unremarkable. Kidneys are normal, without renal calculi, focal lesion, or hydronephrosis. Bladder is unremarkable. Stomach/Bowel: Stomach, small bowel, and colon are mostly decompressed with scattered stool in the colon. No inflammatory changes are appreciated. The appendix is normal. Vascular/Lymphatic: Calcification of the aorta. No aneurysm. Moderately prominent retroperitoneal para-aortic lymphadenopathy with individual lymph nodes measuring up to about 9 mm short axis dimension. This is developing since previous study and may represent metastatic disease. Reproductive: Marked enlargement of the prostate gland, measuring 7 cm in diameter. Other: No free air or free fluid in the abdomen. Abdominal wall musculature appears intact. Musculoskeletal: No destructive bone lesions. Review of the MIP images confirms the above findings. IMPRESSION: 1. No evidence of significant pulmonary embolus. 2. Consolidation and volume loss along the medial aspect of the mediastinum bilaterally, likely post radiation change. Similar appearance to previous study. 3. New areas of airspace disease in the posterior left upper lung and in the right lung base possibly representing superimposed pneumonia. 4. Right hilar soft tissue or lymphadenopathy measuring 1.7 cm diameter, unchanged. 5. Moderate prominence of paraesophageal and para-aortic lymph nodes at the crus of the  diaphragm, more prominent than on prior study. Possible metastatic disease. 6. Moderately prominent retroperitoneal para-aortic lymph nodes measuring up to about 9 mm short axis dimension, developing since previous study and may represent metastatic disease. 7. Marked enlargement of the prostate gland. 8. 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 9. Aortic atherosclerosis. Aortic Atherosclerosis (ICD10-I70.0). Electronically Signed   By: Lucienne Capers M.D.   On: 10/28/2019 22:22   CT ABDOMEN PELVIS W CONTRAST  Result Date: 10/28/2019 CLINICAL DATA:  New onset chest pain and epigastric pain. History of lung cancer. EXAM: CT ANGIOGRAPHY CHEST CT ABDOMEN AND PELVIS WITH CONTRAST TECHNIQUE: Multidetector CT imaging of the chest was performed using the standard protocol during bolus administration of intravenous contrast. Multiplanar CT image reconstructions and MIPs were obtained to evaluate the vascular anatomy. Multidetector CT imaging of the abdomen and pelvis was performed using the standard protocol during bolus administration of intravenous contrast. CONTRAST:  18mL OMNIPAQUE IOHEXOL 350 MG/ML SOLN COMPARISON:  CT chest 08/03/2019.  PET-CT 03/30/2019 FINDINGS: CTA CHEST FINDINGS Cardiovascular: Good opacification of the central and segmental pulmonary arteries. No focal filling defects. No evidence of significant pulmonary embolus. Normal heart size. No pericardial effusions. Normal caliber thoracic aorta. Great vessels are patent. Mild aortic calcification. 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 Mediastinum/Nodes: Right hilar soft tissue or lymphadenopathy measuring 1.7 cm diameter. Consolidation and volume loss along the medial aspect of the mediastinum bilaterally. Left anterior paramediastinal soft tissue. This is likely post radiation change. Similar appearance to previous study. Esophagus is  decompressed. Moderate prominence of paraesophageal and para-aortic lymph nodes at the crus of the diaphragm, measuring up to about 1 cm short axis dimension but more prominent than on prior study. Possible metastatic disease. Lungs/Pleura: Scarring in the medial lungs is likely post radiation change. Bronchiectasis and bronchial wall thickening. New areas of airspace disease in the posterior left upper lung and in the right lung base possibly representing superimposed pneumonia. No pleural effusions. No pneumothorax. Musculoskeletal: No destructive bone lesions. Review of the  MIP images confirms the above findings. CT ABDOMEN and PELVIS FINDINGS Hepatobiliary: No focal liver abnormality is seen. No gallstones, gallbladder wall thickening, or biliary dilatation. Pancreas: Unremarkable. No pancreatic ductal dilatation or surrounding inflammatory changes. Spleen: Normal in size without focal abnormality. Adrenals/Urinary Tract: Adrenal glands are unremarkable. Kidneys are normal, without renal calculi, focal lesion, or hydronephrosis. Bladder is unremarkable. Stomach/Bowel: Stomach, small bowel, and colon are mostly decompressed with scattered stool in the colon. No inflammatory changes are appreciated. The appendix is normal. Vascular/Lymphatic: Calcification of the aorta. No aneurysm. Moderately prominent retroperitoneal para-aortic lymphadenopathy with individual lymph nodes measuring up to about 9 mm short axis dimension. This is developing since previous study and may represent metastatic disease. Reproductive: Marked enlargement of the prostate gland, measuring 7 cm in diameter. Other: No free air or free fluid in the abdomen. Abdominal wall musculature appears intact. Musculoskeletal: No destructive bone lesions. Review of the MIP images confirms the above findings. IMPRESSION: 1. No evidence of significant pulmonary embolus. 2. Consolidation and volume loss along the medial aspect of the mediastinum  bilaterally, likely post radiation change. Similar appearance to previous study. 3. New areas of airspace disease in the posterior left upper lung and in the right lung base possibly representing superimposed pneumonia. 4. Right hilar soft tissue or lymphadenopathy measuring 1.7 cm diameter, unchanged. 5. Moderate prominence of paraesophageal and para-aortic lymph nodes at the crus of the diaphragm, more prominent than on prior study. Possible metastatic disease. 6. Moderately prominent retroperitoneal para-aortic lymph nodes measuring up to about 9 mm short axis dimension, developing since previous study and may represent metastatic disease. 7. Marked enlargement of the prostate gland. 8. 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 9. Aortic atherosclerosis. Aortic Atherosclerosis (ICD10-I70.0). Electronically Signed   By: Lucienne Capers M.D.   On: 10/28/2019 22:22   DG Chest Portable 1 View  Result Date: 10/28/2019 CLINICAL DATA:  Shortness of breath.  History of lung cancer. EXAM: PORTABLE CHEST 1 VIEW COMPARISON:  CT chest dated July 04, 2019 FINDINGS: Persistent medial bilateral upper lung zone airspace opacification is noted which is likely related to prior treatment. There are parent new opacities in the left upper lung zone and right infrahilar region. The heart size appears stable. Aortic calcifications are noted. There is no pneumothorax. There is stable elevation of the left hemidiaphragm. No definite acute osseous abnormality. IMPRESSION: 1. Suggestion of new airspace opacities in the right infrahilar region and left upper lobe which may indicate developing infiltrates in the appropriate clinical setting. 2. Persistent post treatment changes in the bilateral upper lung zones. 3. Stable elevation of the left hemidiaphragm. 4. No convincing large pleural effusion. Electronically Signed   By: Constance Holster M.D.   On: 10/28/2019 18:10    Lab  Data:  CBC: Recent Labs  Lab 10/28/19 1740 10/29/19 0401  WBC 8.5 7.3  NEUTROABS 5.6 5.4  HGB 13.0 11.5*  HCT 40.1 35.1*  MCV 84.2 83.6  PLT 452* 160*   Basic Metabolic Panel: Recent Labs  Lab 10/28/19 1935 10/29/19 0401 10/30/19 0542  NA 136 137 132*  K 4.2 4.4 4.4  CL 100 102 99  CO2 25 24 25   GLUCOSE 90 96 150*  BUN 10 9 11   CREATININE 0.70 0.81 0.68  CALCIUM 8.7* 8.8* 8.8*   GFR: Estimated Creatinine Clearance: 68 mL/min (by C-G formula based on SCr of 0.68 mg/dL). Liver Function Tests: Recent Labs  Lab 10/28/19 1935 10/29/19 0401  AST 19 16  ALT 19 16  ALKPHOS 85 80  BILITOT 0.4 0.4  PROT 7.4 6.9  ALBUMIN 3.1* 2.9*   Recent Labs  Lab 10/28/19 1544  LIPASE 21   No results for input(s): AMMONIA in the last 168 hours. Coagulation Profile: No results for input(s): INR, PROTIME in the last 168 hours. Cardiac Enzymes: No results for input(s): CKTOTAL, CKMB, CKMBINDEX, TROPONINI in the last 168 hours. BNP (last 3 results) No results for input(s): PROBNP in the last 8760 hours. HbA1C: No results for input(s): HGBA1C in the last 72 hours. CBG: No results for input(s): GLUCAP in the last 168 hours. Lipid Profile: No results for input(s): CHOL, HDL, LDLCALC, TRIG, CHOLHDL, LDLDIRECT in the last 72 hours. Thyroid Function Tests: No results for input(s): TSH, T4TOTAL, FREET4, T3FREE, THYROIDAB in the last 72 hours. Anemia Panel: No results for input(s): VITAMINB12, FOLATE, FERRITIN, TIBC, IRON, RETICCTPCT in the last 72 hours. Urine analysis:    Component Value Date/Time   COLORURINE YELLOW 10/28/2019 1544   APPEARANCEUR HAZY (A) 10/28/2019 1544   LABSPEC 1.020 10/28/2019 1544   PHURINE 6.0 10/28/2019 1544   GLUCOSEU NEGATIVE 10/28/2019 1544   HGBUR SMALL (A) 10/28/2019 1544   BILIRUBINUR NEGATIVE 10/28/2019 1544   KETONESUR NEGATIVE 10/28/2019 1544   PROTEINUR 30 (A) 10/28/2019 1544   NITRITE NEGATIVE 10/28/2019 1544   LEUKOCYTESUR SMALL (A)  10/28/2019 1544     Norvella Loscalzo M.D. [facility] 10/30/2019, 2:50 PM   Call night coverage person covering after 7pm

## 2019-10-31 LAB — URINE CULTURE: Culture: >=100000 — AB

## 2019-10-31 LAB — CBC
HCT: 37.4 % — ABNORMAL LOW (ref 39.0–52.0)
Hemoglobin: 11.8 g/dL — ABNORMAL LOW (ref 13.0–17.0)
MCH: 26.8 pg (ref 26.0–34.0)
MCHC: 31.6 g/dL (ref 30.0–36.0)
MCV: 85 fL (ref 80.0–100.0)
Platelets: 504 10*3/uL — ABNORMAL HIGH (ref 150–400)
RBC: 4.4 MIL/uL (ref 4.22–5.81)
RDW: 14.1 % (ref 11.5–15.5)
WBC: 7.7 10*3/uL (ref 4.0–10.5)
nRBC: 0 % (ref 0.0–0.2)

## 2019-10-31 LAB — BASIC METABOLIC PANEL
Anion gap: 11 (ref 5–15)
BUN: 13 mg/dL (ref 8–23)
CO2: 25 mmol/L (ref 22–32)
Calcium: 8.7 mg/dL — ABNORMAL LOW (ref 8.9–10.3)
Chloride: 99 mmol/L (ref 98–111)
Creatinine, Ser: 0.64 mg/dL (ref 0.61–1.24)
GFR calc Af Amer: >60 mL/min (ref >60)
GFR calc non Af Amer: >60 mL/min (ref >60)
Glucose, Bld: 141 mg/dL — ABNORMAL HIGH (ref 70–99)
Potassium: 4.8 mmol/L (ref 3.5–5.1)
Sodium: 135 mmol/L (ref 135–145)

## 2019-10-31 MED ORDER — PREDNISONE 20 MG PO TABS: 40.0000 mg | ORAL_TABLET | Freq: Every day | ORAL | 0 refills | Status: AC

## 2019-10-31 MED ORDER — AMOXICILLIN 875 MG PO TABS: 875.0000 mg | ORAL_TABLET | Freq: Two times a day (BID) | ORAL | 0 refills | Status: AC

## 2019-10-31 MED ORDER — DOXYCYCLINE HYCLATE 100 MG PO CAPS: 100.0000 mg | ORAL_CAPSULE | Freq: Two times a day (BID) | ORAL | 0 refills | Status: AC

## 2019-10-31 NOTE — Progress Notes (Signed)
OT Cancellation Note  Patient Details Name: Evan [name] MRN: [id number] DOB: 14-Dec-1950   Cancelled Treatment:    Reason Eval/Treat Not Completed: Other (comment) Patient reports discharging home today, discussed with patient if had any concerns acute OT can address patient politely decline. Patient is set up for [facility] OT/PT at D/C. Will check back 9/30 if patient still admitted.   Delbert Phenix OT OT pager: [phone]   Rosemary Holms 10/31/2019, 2:49 PM

## 2019-10-31 NOTE — Discharge Summary (Signed)
Physician Discharge Summary  Evan [name] KKX:[id number] DOB: 08/25/1950 DOA: 10/28/2019  PCP: Patient, No Pcp Per  Admit date: 10/28/2019 Discharge date: 10/31/2019  Admitted From: home Disposition:  home Discharging physician: Dwyane Dee, MD  Recommendations for Outpatient Follow-up:  1. Continue with [facility]:PT  Patient discharged to home in Discharge Condition: stable CODE STATUS: Full Diet recommendation:  Diet Orders (From admission, onward)    Start     Ordered   10/31/19 0000  Diet - low sodium heart healthy        10/31/19 1319   10/28/19 2314  Diet regular Room service appropriate? Yes; Fluid consistency: Thin  Diet effective now       Question Answer Comment  Room service appropriate? Yes   Fluid consistency: Thin      10/28/19 2314          Hospital Course: Patient is a 69 year old male with history of stage IIIa non-small cell lung CA, completed chemo and radiation, had been doing well at home on his own, presented with multiple complaints.  Patient has not been eating for the last couple of days, reported nausea, disequilibrium, poor appetite and unable to stand.  Patient reported fevers, chills, generalized weakness and malaise.  Also reported no BM for last 2 days, abdominal pain. In ED, patient was found to be tachypneic, tachycardic and hypoxemic.  Chest CTA showed stable airspace disease in the posterior left upper lung and right lung base likely representing superimposed pneumonia.  Moderately increased prominence of paraesophageal and para-aortic lymph nodes likely represent metastatic disease. Evidence of asymptomatic bacteriuria, no symptoms of dysuria. Had no further indication for treatment.  CT head was negative for acute abnormalities, CT abdomen demonstrated retroperitoneal periaortic LAD which represents metastatic disease, enlarged prostate.  Overall, he responded well to Rocephin and azithromycin.  He was also treated with Solu-Medrol  and his wheezing and dyspnea tremendously improved.  He was discharged on prednisone, amoxicillin, and doxycycline to complete course.  He was also evaluated by physical therapy with recommendations for home health PT which was also ordered and arranged at discharge.   The patient's chronic medical conditions were treated accordingly per the patient's home medication regimen except as noted.  On day of discharge, patient was felt deemed stable for discharge. Patient/family member advised to call PCP or come back to ER if needed.   Discharge Diagnoses:   Principal Diagnosis: Pneumonia  Active Hospital Problems   Diagnosis Date Noted  . Pneumonia 10/28/2019  . Constipation 10/29/2019  . Asymptomatic bacteriuria 10/29/2019  . 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 COPD (chronic obstructive pulmonary disease) ([place]) 02/09/2008  . Essential hypertension 02/09/2008    Resolved Hospital Problems  No resolved problems to display.    Discharge Instructions    Diet - low sodium heart healthy   Complete by: As directed    Increase activity slowly   Complete by: As directed      Allergies as of 10/31/2019      Reactions   Chantix [varenicline] Other (See Comments)   Weird dreams      Medication List    STOP taking these medications   penicillin v potassium 500 MG tablet Commonly known as: VEETID     TAKE these medications   acetaminophen 500 MG tablet Commonly known as: TYLENOL Take 500 mg by mouth every 6 (six) hours as needed for mild pain, fever or headache.   albuterol  108 (90 Base) MCG/ACT inhaler Commonly known as: VENTOLIN HFA TAKE 2 PUFFS BY MOUTH EVERY 6 HOURS AS NEEDED FOR WHEEZE OR SHORTNESS OF BREATH What changed: See the new instructions.   amoxicillin 875 MG tablet Commonly known as: AMOXIL Take 1 tablet (875 mg total) by mouth 2 (two) times daily for 2 days.   Bevespi Aerosphere 9-4.8 MCG/ACT Aero Generic drug:  Glycopyrrolate-Formoterol Inhale 2 puffs into the lungs 2 (two) times daily.   [employer] Aspirin EC 325 MG EC tablet Generic drug: aspirin TAKE 1 TABLET BY MOUTH EVERY DAY What changed: how much to take   doxycycline 100 MG capsule Commonly known as: VIBRAMYCIN Take 1 capsule (100 mg total) by mouth 2 (two) times daily for 2 days.   lactose free nutrition Liqd Take 237 mLs by mouth daily.   multivitamin with minerals Tabs tablet Take 1 tablet by mouth daily.   nicotine 14 mg/24hr patch Commonly known as: NICODERM CQ - dosed in mg/24 hours Place 1 patch (14 mg total) onto the skin daily. For 6 weeks.  Then decrease to 7 mg daily.   nicotine polacrilex 4 MG gum Commonly known as: NICORETTE Take 1 each (4 mg total) by mouth as needed for smoking cessation.   predniSONE 20 MG tablet Commonly known as: DELTASONE Take 2 tablets (40 mg total) by mouth daily with breakfast for 2 days.   tamsulosin 0.4 MG Caps capsule Commonly known as: FLOMAX Take 0.4 mg by mouth at bedtime.       Follow-up Information    [facility], [facility] Follow up.   Specialty: Home Health Services Contact information: [place] [address] [phone]              Allergies  Allergen Reactions  . Chantix [Varenicline] Other (See Comments)    Weird dreams    Consultations: none  Discharge Exam: BP 122/88 (BP Location: Left Arm)   Pulse 87   Temp 97.7 F (36.5 C) (Oral)   Resp 14   Ht 5' 5.5" (1.664 m)   Wt 54.4 kg   SpO2 99%   BMI 19.67 kg/m  General appearance: alert, cooperative and no distress Head: Normocephalic, without obvious abnormality, atraumatic Eyes: EOMI Lungs: scattered coarse sounds Heart: regular rate and rhythm and S1, S2 normal Abdomen: normal findings: bowel sounds normal and soft, non-tender Extremities: no edema Skin: mobility and turgor normal Neurologic: Grossly normal  The results of significant diagnostics from this  hospitalization (including imaging, microbiology, ancillary and laboratory) are listed below for reference.   Microbiology: Recent Results (from the past 240 hour(s))  Urine culture     Status: Abnormal   Collection Time: 10/28/19  3:44 PM   Specimen: Urine, Random  Result Value Ref Range Status   Specimen Description   Final    URINE, RANDOM Performed at [facility] [address] [place], [place] [address]    Special Requests   Final    NONE Performed at [facility], [place] [address], [place], [place] [address]    Culture >=100,000 COLONIES/mL ACINETOBACTER BAUMANNII (A)  Final   Report Status 10/31/2019 FINAL  Final   Organism ID, Bacteria ACINETOBACTER BAUMANNII (A)  Final      Susceptibility   Acinetobacter baumannii - MIC*    CEFTAZIDIME 4 SENSITIVE Sensitive     CIPROFLOXACIN <=0.25 SENSITIVE Sensitive     GENTAMICIN <=1 SENSITIVE Sensitive     IMIPENEM <=0.25 SENSITIVE Sensitive  PIP/TAZO <=4 SENSITIVE Sensitive     TRIMETH/SULFA <=20 SENSITIVE Sensitive     AMPICILLIN/SULBACTAM <=2 SENSITIVE Sensitive     * >=100,000 COLONIES/mL ACINETOBACTER BAUMANNII  Respiratory Panel by RT PCR (Flu A&B, Covid) - Nasopharyngeal Swab     Status: None   Collection Time: 10/28/19  5:41 PM   Specimen: Nasopharyngeal Swab  Result Value Ref Range Status   SARS Coronavirus 2 by RT PCR NEGATIVE NEGATIVE Final    Comment: (NOTE) SARS-CoV-2 target nucleic acids are NOT DETECTED.  The SARS-CoV-2 RNA is generally detectable in upper respiratoy specimens during the acute phase of infection. The lowest concentration of SARS-CoV-2 viral copies this assay can detect is 131 copies/mL. A negative result does not preclude SARS-Cov-2 infection and should not be used as the sole basis for treatment or other patient management decisions. A negative result may occur with  improper specimen collection/handling, submission of specimen other than nasopharyngeal  swab, presence of viral mutation(s) within the areas targeted by this assay, and inadequate number of viral copies (<131 copies/mL). A negative result must be combined with clinical observations, patient history, and epidemiological information. The expected result is Negative.  Fact Sheet for Patients:  PinkCheek.be  Fact Sheet for Healthcare Providers:  GravelBags.it  This test is no t yet approved or cleared by the [place] FDA and  has been authorized for detection and/or diagnosis of SARS-CoV-2 by FDA under an Emergency Use Authorization (EUA). This EUA will remain  in effect (meaning this test can be used) for the duration of the COVID-19 declaration under Section 564(b)(1) of the Act, 21 U.S.C. section 360bbb-3(b)(1), unless the authorization is terminated or revoked sooner.     Influenza A by PCR NEGATIVE NEGATIVE Final   Influenza B by PCR NEGATIVE NEGATIVE Final    Comment: (NOTE) The Xpert Xpress SARS-CoV-2/FLU/RSV assay is intended as an aid in  the diagnosis of influenza from Nasopharyngeal swab specimens and  should not be used as a sole basis for treatment. Nasal washings and  aspirates are unacceptable for Xpert Xpress SARS-CoV-2/FLU/RSV  testing.  Fact Sheet for Patients: PinkCheek.be  Fact Sheet for Healthcare Providers: GravelBags.it  This test is not yet approved or cleared by the [place] FDA and  has been authorized for detection and/or diagnosis of SARS-CoV-2 by  FDA under an Emergency Use Authorization (EUA). This EUA will remain  in effect (meaning this test can be used) for the duration of the  Covid-19 declaration under Section 564(b)(1) of the Act, 21  U.S.C. section 360bbb-3(b)(1), unless the authorization is  terminated or revoked. Performed at [facility], [place] [address] [place], [place] [address]    Sputum culture     Status: None   Collection Time: 10/29/19  1:31 AM   Specimen: Sputum  Result Value Ref Range Status   Specimen Description SPUTUM  Final   Special Requests NONE  Final   Sputum evaluation   Final    Sputum specimen not acceptable for testing.  Please recollect.   Performed at [facility], [place] [address], [place], [place] [address]    Report Status 10/29/2019 FINAL  Final     Labs: BNP (last 3 results) No results for input(s): BNP in the last 8760 hours. Basic Metabolic Panel: Recent Labs  Lab 10/28/19 1935 10/29/19 0401 10/30/19 0542 10/31/19 0523  NA 136 137 132* 135  K 4.2 4.4 4.4 4.8  CL 100 102 99 99  CO2 25 24 25  25  GLUCOSE 90 96 150* 141*  BUN 10 9 11 13   CREATININE 0.70 0.81 0.68 0.64  CALCIUM 8.7* 8.8* 8.8* 8.7*   Liver Function Tests: Recent Labs  Lab 10/28/19 1935 10/29/19 0401  AST 19 16  ALT 19 16  ALKPHOS 85 80  BILITOT 0.4 0.4  PROT 7.4 6.9  ALBUMIN 3.1* 2.9*   Recent Labs  Lab 10/28/19 1544  LIPASE 21   No results for input(s): AMMONIA in the last 168 hours. CBC: Recent Labs  Lab 10/28/19 1740 10/29/19 0401 10/31/19 0523  WBC 8.5 7.3 7.7  NEUTROABS 5.6 5.4  --   HGB 13.0 11.5* 11.8*  HCT 40.1 35.1* 37.4*  MCV 84.2 83.6 85.0  PLT 452* 423* 504*   Cardiac Enzymes: No results for input(s): CKTOTAL, CKMB, CKMBINDEX, TROPONINI in the last 168 hours. BNP: Invalid input(s): POCBNP CBG: No results for input(s): GLUCAP in the last 168 hours. D-Dimer No results for input(s): DDIMER in the last 72 hours. Hgb A1c No results for input(s): HGBA1C in the last 72 hours. Lipid Profile No results for input(s): CHOL, HDL, LDLCALC, TRIG, CHOLHDL, LDLDIRECT in the last 72 hours. Thyroid function studies No results for input(s): TSH, T4TOTAL, T3FREE, THYROIDAB in the last 72 hours.  Invalid input(s): FREET3 Anemia work up No results for input(s): VITAMINB12, FOLATE, FERRITIN, TIBC, IRON, RETICCTPCT in  the last 72 hours. Urinalysis    Component Value Date/Time   COLORURINE YELLOW 10/28/2019 1544   APPEARANCEUR HAZY (A) 10/28/2019 1544   LABSPEC 1.020 10/28/2019 1544   PHURINE 6.0 10/28/2019 1544   GLUCOSEU NEGATIVE 10/28/2019 1544   HGBUR SMALL (A) 10/28/2019 1544   BILIRUBINUR NEGATIVE 10/28/2019 1544   KETONESUR NEGATIVE 10/28/2019 1544   PROTEINUR 30 (A) 10/28/2019 1544   NITRITE NEGATIVE 10/28/2019 1544   LEUKOCYTESUR SMALL (A) 10/28/2019 1544   Sepsis Labs Invalid input(s): PROCALCITONIN,  WBC,  LACTICIDVEN Microbiology Recent Results (from the past 240 hour(s))  Urine culture     Status: Abnormal   Collection Time: 10/28/19  3:44 PM   Specimen: Urine, Random  Result Value Ref Range Status   Specimen Description   Final    URINE, RANDOM Performed at [facility], [place] [address] [place], [place] [address]    Special Requests   Final    NONE Performed at [facility], [place] [address], [place], [place] [address]    Culture >=100,000 COLONIES/mL ACINETOBACTER BAUMANNII (A)  Final   Report Status 10/31/2019 FINAL  Final   Organism ID, Bacteria ACINETOBACTER BAUMANNII (A)  Final      Susceptibility   Acinetobacter baumannii - MIC*    CEFTAZIDIME 4 SENSITIVE Sensitive     CIPROFLOXACIN <=0.25 SENSITIVE Sensitive     GENTAMICIN <=1 SENSITIVE Sensitive     IMIPENEM <=0.25 SENSITIVE Sensitive     PIP/TAZO <=4 SENSITIVE Sensitive     TRIMETH/SULFA <=20 SENSITIVE Sensitive     AMPICILLIN/SULBACTAM <=2 SENSITIVE Sensitive     * >=100,000 COLONIES/mL ACINETOBACTER BAUMANNII  Respiratory Panel by RT PCR (Flu A&B, Covid) - Nasopharyngeal Swab     Status: None   Collection Time: 10/28/19  5:41 PM   Specimen: Nasopharyngeal Swab  Result Value Ref Range Status   SARS Coronavirus 2 by RT PCR NEGATIVE NEGATIVE Final    Comment: (NOTE) SARS-CoV-2 target nucleic acids are NOT DETECTED.  The SARS-CoV-2 RNA is generally detectable in upper  respiratoy specimens during the acute phase of infection. The lowest concentration of  SARS-CoV-2 viral copies this assay can detect is 131 copies/mL. A negative result does not preclude SARS-Cov-2 infection and should not be used as the sole basis for treatment or other patient management decisions. A negative result may occur with  improper specimen collection/handling, submission of specimen other than nasopharyngeal swab, presence of viral mutation(s) within the areas targeted by this assay, and inadequate number of viral copies (<131 copies/mL). A negative result must be combined with clinical observations, patient history, and epidemiological information. The expected result is Negative.  Fact Sheet for Patients:  PinkCheek.be  Fact Sheet for Healthcare Providers:  GravelBags.it  This test is no t yet approved or cleared by the [place] FDA and  has been authorized for detection and/or diagnosis of SARS-CoV-2 by FDA under an Emergency Use Authorization (EUA). This EUA will remain  in effect (meaning this test can be used) for the duration of the COVID-19 declaration under Section 564(b)(1) of the Act, 21 U.S.C. section 360bbb-3(b)(1), unless the authorization is terminated or revoked sooner.     Influenza A by PCR NEGATIVE NEGATIVE Final   Influenza B by PCR NEGATIVE NEGATIVE Final    Comment: (NOTE) The Xpert Xpress SARS-CoV-2/FLU/RSV assay is intended as an aid in  the diagnosis of influenza from Nasopharyngeal swab specimens and  should not be used as a sole basis for treatment. Nasal washings and  aspirates are unacceptable for Xpert Xpress SARS-CoV-2/FLU/RSV  testing.  Fact Sheet for Patients: PinkCheek.be  Fact Sheet for Healthcare Providers: GravelBags.it  This test is not yet approved or cleared by the [place] FDA and  has been  authorized for detection and/or diagnosis of SARS-CoV-2 by  FDA under an Emergency Use Authorization (EUA). This EUA will remain  in effect (meaning this test can be used) for the duration of the  Covid-19 declaration under Section 564(b)(1) of the Act, 21  U.S.C. section 360bbb-3(b)(1), unless the authorization is  terminated or revoked. Performed at [facility], [place] [address] [place], [place] [address]   Sputum culture     Status: None   Collection Time: 10/29/19  1:31 AM   Specimen: Sputum  Result Value Ref Range Status   Specimen Description SPUTUM  Final   Special Requests NONE  Final   Sputum evaluation   Final    Sputum specimen not acceptable for testing.  Please recollect.   Performed at [facility], [place] [address], [place], [place] [address]    Report Status 10/29/2019 FINAL  Final    Procedures/Studies: CT Head Wo Contrast  Result Date: 10/28/2019 CLINICAL DATA:  Non-small cell lung cancer now with dizziness and balance difficulties. Headache. EXAM: CT HEAD WITHOUT CONTRAST TECHNIQUE: Contiguous axial images were obtained from the base of the skull through the vertex without intravenous contrast. COMPARISON:  MRI brain 08/29/2018 FINDINGS: Brain: No mass effect or midline shift. No abnormal extra-axial fluid collections. Gray-white matter junctions are distinct. Basal cisterns are not effaced. No acute intracranial hemorrhage. No specific evidence of an intracranial mass although contrast-enhanced MRI would be the most sensitive modality for detection of intracranial metastatic disease. Vascular: No hyperdense vessel or unexpected calcification. Skull: Calvarium appears intact.  No focal lesions identified. Sinuses/Orbits: Paranasal sinuses and mastoid air cells are clear. Other: None. IMPRESSION: No acute intracranial abnormalities. No specific evidence of an intracranial mass although contrast-enhanced MRI would be the most sensitive  modality for detection of intracranial metastatic disease. Electronically Signed   By: Oren Beckmann.D.  On: 10/28/2019 22:28   CT Angio Chest PE W and/or Wo Contrast  Result Date: 10/28/2019 CLINICAL DATA:  New onset chest pain and epigastric pain. History of lung cancer. EXAM: CT ANGIOGRAPHY CHEST CT ABDOMEN AND PELVIS WITH CONTRAST TECHNIQUE: Multidetector CT imaging of the chest was performed using the standard protocol during bolus administration of intravenous contrast. Multiplanar CT image reconstructions and MIPs were obtained to evaluate the vascular anatomy. Multidetector CT imaging of the abdomen and pelvis was performed using the standard protocol during bolus administration of intravenous contrast. CONTRAST:  166mL OMNIPAQUE IOHEXOL 350 MG/ML SOLN COMPARISON:  CT chest 08/03/2019.  PET-CT 03/30/2019 FINDINGS: CTA CHEST FINDINGS Cardiovascular: Good opacification of the central and segmental pulmonary arteries. No focal filling defects. No evidence of significant pulmonary embolus. Normal heart size. No pericardial effusions. Normal caliber thoracic aorta. Great vessels are patent. Mild aortic calcification. 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 Mediastinum/Nodes: Right hilar soft tissue or lymphadenopathy measuring 1.7 cm diameter. Consolidation and volume loss along the medial aspect of the mediastinum bilaterally. Left anterior paramediastinal soft tissue. This is likely post radiation change. Similar appearance to previous study. Esophagus is decompressed. Moderate prominence of paraesophageal and para-aortic lymph nodes at the crus of the diaphragm, measuring up to about 1 cm short axis dimension but more prominent than on prior study. Possible metastatic disease. Lungs/Pleura: Scarring in the medial lungs is likely post radiation change. Bronchiectasis and bronchial wall thickening. New areas of airspace disease in the posterior left  upper lung and in the right lung base possibly representing superimposed pneumonia. No pleural effusions. No pneumothorax. Musculoskeletal: No destructive bone lesions. Review of the MIP images confirms the above findings. CT ABDOMEN and PELVIS FINDINGS Hepatobiliary: No focal liver abnormality is seen. No gallstones, gallbladder wall thickening, or biliary dilatation. Pancreas: Unremarkable. No pancreatic ductal dilatation or surrounding inflammatory changes. Spleen: Normal in size without focal abnormality. Adrenals/Urinary Tract: Adrenal glands are unremarkable. Kidneys are normal, without renal calculi, focal lesion, or hydronephrosis. Bladder is unremarkable. Stomach/Bowel: Stomach, small bowel, and colon are mostly decompressed with scattered stool in the colon. No inflammatory changes are appreciated. The appendix is normal. Vascular/Lymphatic: Calcification of the aorta. No aneurysm. Moderately prominent retroperitoneal para-aortic lymphadenopathy with individual lymph nodes measuring up to about 9 mm short axis dimension. This is developing since previous study and may represent metastatic disease. Reproductive: Marked enlargement of the prostate gland, measuring 7 cm in diameter. Other: No free air or free fluid in the abdomen. Abdominal wall musculature appears intact. Musculoskeletal: No destructive bone lesions. Review of the MIP images confirms the above findings. IMPRESSION: 1. No evidence of significant pulmonary embolus. 2. Consolidation and volume loss along the medial aspect of the mediastinum bilaterally, likely post radiation change. Similar appearance to previous study. 3. New areas of airspace disease in the posterior left upper lung and in the right lung base possibly representing superimposed pneumonia. 4. Right hilar soft tissue or lymphadenopathy measuring 1.7 cm diameter, unchanged. 5. Moderate prominence of paraesophageal and para-aortic lymph nodes at the crus of the diaphragm, more  prominent than on prior study. Possible metastatic disease. 6. Moderately prominent retroperitoneal para-aortic lymph nodes measuring up to about 9 mm short axis dimension, developing since previous study and may represent metastatic disease. 7. Marked enlargement of the prostate gland. 8. 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 9. Aortic atherosclerosis. Aortic Atherosclerosis (ICD10-I70.0). Electronically Signed   By:  Lucienne Capers M.D.   On: 10/28/2019 22:22   CT ABDOMEN PELVIS W CONTRAST  Result Date: 10/28/2019 CLINICAL DATA:  New onset chest pain and epigastric pain. History of lung cancer. EXAM: CT ANGIOGRAPHY CHEST CT ABDOMEN AND PELVIS WITH CONTRAST TECHNIQUE: Multidetector CT imaging of the chest was performed using the standard protocol during bolus administration of intravenous contrast. Multiplanar CT image reconstructions and MIPs were obtained to evaluate the vascular anatomy. Multidetector CT imaging of the abdomen and pelvis was performed using the standard protocol during bolus administration of intravenous contrast. CONTRAST:  175mL OMNIPAQUE IOHEXOL 350 MG/ML SOLN COMPARISON:  CT chest 08/03/2019.  PET-CT 03/30/2019 FINDINGS: CTA CHEST FINDINGS Cardiovascular: Good opacification of the central and segmental pulmonary arteries. No focal filling defects. No evidence of significant pulmonary embolus. Normal heart size. No pericardial effusions. Normal caliber thoracic aorta. Great vessels are patent. Mild aortic calcification. 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 Mediastinum/Nodes: Right hilar soft tissue or lymphadenopathy measuring 1.7 cm diameter. Consolidation and volume loss along the medial aspect of the mediastinum bilaterally. Left anterior paramediastinal soft tissue. This is likely post radiation change. Similar appearance to previous study. Esophagus is decompressed. Moderate  prominence of paraesophageal and para-aortic lymph nodes at the crus of the diaphragm, measuring up to about 1 cm short axis dimension but more prominent than on prior study. Possible metastatic disease. Lungs/Pleura: Scarring in the medial lungs is likely post radiation change. Bronchiectasis and bronchial wall thickening. New areas of airspace disease in the posterior left upper lung and in the right lung base possibly representing superimposed pneumonia. No pleural effusions. No pneumothorax. Musculoskeletal: No destructive bone lesions. Review of the MIP images confirms the above findings. CT ABDOMEN and PELVIS FINDINGS Hepatobiliary: No focal liver abnormality is seen. No gallstones, gallbladder wall thickening, or biliary dilatation. Pancreas: Unremarkable. No pancreatic ductal dilatation or surrounding inflammatory changes. Spleen: Normal in size without focal abnormality. Adrenals/Urinary Tract: Adrenal glands are unremarkable. Kidneys are normal, without renal calculi, focal lesion, or hydronephrosis. Bladder is unremarkable. Stomach/Bowel: Stomach, small bowel, and colon are mostly decompressed with scattered stool in the colon. No inflammatory changes are appreciated. The appendix is normal. Vascular/Lymphatic: Calcification of the aorta. No aneurysm. Moderately prominent retroperitoneal para-aortic lymphadenopathy with individual lymph nodes measuring up to about 9 mm short axis dimension. This is developing since previous study and may represent metastatic disease. Reproductive: Marked enlargement of the prostate gland, measuring 7 cm in diameter. Other: No free air or free fluid in the abdomen. Abdominal wall musculature appears intact. Musculoskeletal: No destructive bone lesions. Review of the MIP images confirms the above findings. IMPRESSION: 1. No evidence of significant pulmonary embolus. 2. Consolidation and volume loss along the medial aspect of the mediastinum bilaterally, likely post  radiation change. Similar appearance to previous study. 3. New areas of airspace disease in the posterior left upper lung and in the right lung base possibly representing superimposed pneumonia. 4. Right hilar soft tissue or lymphadenopathy measuring 1.7 cm diameter, unchanged. 5. Moderate prominence of paraesophageal and para-aortic lymph nodes at the crus of the diaphragm, more prominent than on prior study. Possible metastatic disease. 6. Moderately prominent retroperitoneal para-aortic lymph nodes measuring up to about 9 mm short axis dimension, developing since previous study and may represent metastatic disease. 7. Marked enlargement of the prostate gland. 8. 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 9. Aortic atherosclerosis. Aortic Atherosclerosis (ICD10-I70.0). Electronically Signed  By: Lucienne Capers M.D.   On: 10/28/2019 22:22   DG Chest Portable 1 View  Result Date: 10/28/2019 CLINICAL DATA:  Shortness of breath.  History of lung cancer. EXAM: PORTABLE CHEST 1 VIEW COMPARISON:  CT chest dated July 04, 2019 FINDINGS: Persistent medial bilateral upper lung zone airspace opacification is noted which is likely related to prior treatment. There are parent new opacities in the left upper lung zone and right infrahilar region. The heart size appears stable. Aortic calcifications are noted. There is no pneumothorax. There is stable elevation of the left hemidiaphragm. No definite acute osseous abnormality. IMPRESSION: 1. Suggestion of new airspace opacities in the right infrahilar region and left upper lobe which may indicate developing infiltrates in the appropriate clinical setting. 2. Persistent post treatment changes in the bilateral upper lung zones. 3. Stable elevation of the left hemidiaphragm. 4. No convincing large pleural effusion. Electronically Signed   By: Constance Holster M.D.   On: 10/28/2019 18:10     Time coordinating discharge: Over  30 minutes    Dwyane Dee, MD  [facility] 10/31/2019, 5:30 PM Pager: Secure chat  If 7PM-7AM, please contact night-coverage [email or web address] Password [email or web address]

## 2019-10-31 NOTE — Hospital Course (Signed)
Patient is a 69 year old male with history of stage IIIa non-small cell lung CA, completed chemo and radiation, had been doing well at home on his own, presented with multiple complaints.  Patient has not been eating for the last couple of days, reported nausea, disequilibrium, poor appetite and unable to stand.  Patient reported fevers, chills, generalized weakness and malaise.  Also reported no BM for last 2 days, abdominal pain. In ED, patient was found to be tachypneic, tachycardic and hypoxemic.  Chest CTA showed stable airspace disease in the posterior left upper lung and right lung base likely representing superimposed pneumonia.  Moderately increased prominence of paraesophageal and para-aortic lymph nodes likely represent metastatic disease. Evidence of asymptomatic bacteriuria, no symptoms of dysuria. Had no further indication for treatment.  CT head was negative for acute abnormalities, CT abdomen demonstrated retroperitoneal periaortic LAD which represents metastatic disease, enlarged prostate.  Overall, he responded well to Rocephin and azithromycin.  He was also treated with Solu-Medrol and his wheezing and dyspnea tremendously improved.  He was discharged on prednisone, amoxicillin, and doxycycline to complete course.  He was also evaluated by physical therapy with recommendations for home health PT which was also ordered and arranged at discharge.

## 2019-10-31 NOTE — TOC Initial Note (Signed)
Transition of Care [facility]) - Initial/Assessment Note    Patient Details  Name: Evan [name] MRN: [id number] Date of Birth: February 08, 1950  [facility] CM/SW Contact:    Kristee Angus, Marjie Skiff, RN Phone Number: 10/31/2019, 12:40 PM  Clinical Narrative:                 Pt from home and was independent with ADLs at baseline. Physical therapy evaluation gone over with pt at bedside. Choice offered for home health services and [facility] chosen. [facility] liaison contacted for referral. Will need MD orders for HHPT/OT.  Expected Discharge Plan: [place] Barriers to Discharge: Continued Medical Work up   Patient Goals and CMS Choice Patient states their goals for this hospitalization and ongoing recovery are:: to get stronger CMS Medicare.gov Compare Post Acute Care list provided to:: Patient Choice offered to / list presented to : Patient  Expected Discharge Plan and Services Expected Discharge Plan: [place]   Discharge Planning Services: CM Consult Post Acute Care Choice: [place] arrangements for the past 2 months: Apartment                           HH Arranged: PT, OT HH Agency: [place] Date [facility] Agency Contacted: 10/31/19 Time HH Agency Contacted: 74 Representative spoke with at [place]: Tommi Rumps  Prior Living Arrangements/Services Living arrangements for the past 2 months: Apartment Lives with:: Self Patient language and need for interpreter reviewed:: Yes Do you feel safe going back to the place where you live?: Yes      Need for Family Participation in Patient Care: Yes (Comment) Care giver support system in place?: Yes (comment)   Criminal Activity/Legal Involvement Pertinent to Current Situation/Hospitalization: No - Comment as needed  Activities of Daily Living Home Assistive Devices/Equipment: None ADL Screening (condition at time of admission) Patient's cognitive ability adequate to safely  complete daily activities?: Yes Is the patient deaf or have difficulty hearing?: No Does the patient have difficulty seeing, even when wearing glasses/contacts?: No Does the patient have difficulty concentrating, remembering, or making decisions?: No Patient able to express need for assistance with ADLs?: Yes Does the patient have difficulty dressing or bathing?: No Independently performs ADLs?: Yes (appropriate for developmental age) Does the patient have difficulty walking or climbing stairs?: No Weakness of Legs: None Weakness of Arms/Hands: None  Permission Sought/Granted   Permission granted to share information with : Yes, Verbal Permission Granted     Permission granted to share info w AGENCY: [facility]        Emotional Assessment Appearance:: Appears stated age Attitude/Demeanor/Rapport: Engaged Affect (typically observed): Calm Orientation: : Oriented to Self, Oriented to Place, Oriented to  Time, Oriented to Situation      Admission diagnosis:  Pneumonia [J18.9] Patient Active Problem List   Diagnosis Date Noted  . Constipation 10/29/2019  . Asymptomatic bacteriuria 10/29/2019  . Pneumonia 10/28/2019  . Primary cancer of left lower lobe of lung ([place]) 04/06/2019  . Involuntary movements 08/24/2018  . Primary cancer of right middle lobe of lung ([place]) 07/16/2018  . Nodule of middle lobe of right lung 06/20/2018  . Bilateral carotid artery stenosis 02/18/2017  . Left renal mass 07/19/2016  . Phlebitis 02/04/2015  . Full code status 11/04/2014  . Encounter for antineoplastic chemotherapy 11/04/2014  . 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 TOBACCO ABUSE 04/02/2008  . 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 HYPERLIPIDEMIA 02/09/2008  . DEPRESSION 02/09/2008  . Essential hypertension 02/09/2008  . COPD (chronic obstructive pulmonary disease) ([place])  02/09/2008  . HEADACHE 02/09/2008   PCP:  Patient, No Pcp Per Pharmacy:   [id number] - , [address] [place] [address] Phone: [phone] Fax: [phone]     Social Determinants of Health (SDOH) Interventions    Readmission Risk Interventions No flowsheet data found.

## 2019-11-01 ENCOUNTER — Telehealth: Payer: Self-pay | Admitting: Internal Medicine

## 2019-11-01 ENCOUNTER — Telehealth: Payer: Self-pay | Admitting: Medical Oncology

## 2019-11-01 NOTE — Telephone Encounter (Signed)
Called and spoke with patient. R/s 10/11 appt to 10/14. Confirmed new date and time

## 2019-11-01 NOTE — Telephone Encounter (Signed)
Requests nicoderm patches. I LVM for Evan [name] to [employer] who ordered them back in June.

## 2019-11-02 ENCOUNTER — Telehealth: Payer: Self-pay | Admitting: Medical Oncology

## 2019-11-02 NOTE — Telephone Encounter (Signed)
Son requested to cancel CT for next week. Pt just had one .

## 2019-11-05 ENCOUNTER — Other Ambulatory Visit: Payer: Medicare PPO

## 2019-11-05 ENCOUNTER — Telehealth: Payer: Self-pay | Admitting: Medical Oncology

## 2019-11-05 ENCOUNTER — Ambulatory Visit (HOSPITAL_COMMUNITY): Payer: Medicare PPO

## 2019-11-05 NOTE — Telephone Encounter (Signed)
No. Ok to cancel the scan and see him as planned unless he wants to come this week. We do have better schedule this week compared to next week.

## 2019-11-05 NOTE — Telephone Encounter (Signed)
I told Evan [name] he does not need another lab or CT appt and r/s his appt w Julien Nordmann for tomorrow.

## 2019-11-06 ENCOUNTER — Other Ambulatory Visit: Payer: Self-pay

## 2019-11-06 ENCOUNTER — Encounter: Payer: Self-pay | Admitting: Internal Medicine

## 2019-11-06 ENCOUNTER — Telehealth: Payer: Self-pay | Admitting: Pulmonary Disease

## 2019-11-06 ENCOUNTER — Inpatient Hospital Stay: Payer: Medicare PPO | Attending: Internal Medicine | Admitting: Internal Medicine

## 2019-11-06 VITALS — BP 109/80 | HR 100 | Temp 96.7°F | Resp 14 | Ht 65.5 in | Wt 109.7 lb

## 2019-11-06 DIAGNOSIS — C3490 Malignant neoplasm of unspecified part of unspecified bronchus or lung: Secondary | ICD-10-CM

## 2019-11-06 DIAGNOSIS — C3411 Malignant neoplasm of upper lobe, right bronchus or lung: Secondary | ICD-10-CM

## 2019-11-06 DIAGNOSIS — C342 Malignant neoplasm of middle lobe, bronchus or lung: Secondary | ICD-10-CM | POA: Insufficient documentation

## 2019-11-06 DIAGNOSIS — J189 Pneumonia, unspecified organism: Secondary | ICD-10-CM | POA: Insufficient documentation

## 2019-11-06 DIAGNOSIS — C3432 Malignant neoplasm of lower lobe, left bronchus or lung: Secondary | ICD-10-CM | POA: Diagnosis present

## 2019-11-06 DIAGNOSIS — I10 Essential (primary) hypertension: Secondary | ICD-10-CM

## 2019-11-06 DIAGNOSIS — J440 Chronic obstructive pulmonary disease with acute lower respiratory infection: Secondary | ICD-10-CM | POA: Insufficient documentation

## 2019-11-06 DIAGNOSIS — C3412 Malignant neoplasm of upper lobe, left bronchus or lung: Secondary | ICD-10-CM | POA: Diagnosis not present

## 2019-11-06 DIAGNOSIS — Z85118 Personal history of other malignant neoplasm of bronchus and lung: Secondary | ICD-10-CM | POA: Insufficient documentation

## 2019-11-06 NOTE — Progress Notes (Signed)
[place] [phone]   [phone]  OFFICE PROGRESS NOTE  Patient, No Pcp Per No address on file  PRINCIPAL DIAGNOSIS: 1) stage IIIA (T2a, N2, M0) non-small cell lung cancer, squamous cell carcinoma of the left upper lobe diagnosed in August 2016 2)  Stage IIIA (T2 N2 Mx) non-small cell lung cancer, adenocarcinoma, involving the right upper lobe and mediastinal lymphadenopathy diagnosed in June 2010.   PRIOR THERAPY:  1. Status post concurrent chemoradiation with weekly carboplatin and paclitaxel; last dose was given April 08, 2008. 2. Status post 3 cycles of consolidation chemotherapy with docetaxel; last dose was given on Jun 26, 2008. 3. Concurrent chemoradiation with weekly carboplatin for AUC of 2 and paclitaxel 45 MG/M2. First dose expected on 09/23/2014 status post 7 cycles. Last dose was given 12/01/2014 with partial response. 4. Consolidation chemotherapy with carboplatin for AUC of 5 and paclitaxel 175 MG/M2 every 3 weeks with Neulasta support. First dose 01/02/2015. Status post 3 cycles.   5. Curative SBRT to right middle lobe lung nodule under the care of Dr. [name] completed on 07/24/2018   CURRENT THERAPY: Observation.  INTERVAL HISTORY: Evan [name] 69 y.o. male returns to the clinic today for follow-up visit accompanied by his son.  The patient continues to complain of productive cough and shortness of breath.  He was seen recently at the emergency department and diagnosed with pneumonia.  He was treated with doxycycline.  He denied having any current chest pain or hemoptysis.  He denied having any nausea, vomiting, diarrhea or constipation.  He has no headache or visual changes.  He has no recent weight loss or night sweats.  The patient had CT scan of the chest, abdomen pelvis as well as CT of the head during his visit to the emergency department and he is here today for evaluation and discussion of his scan results and  recommendation regarding his condition.   MEDICAL HISTORY: Past Medical History:  Diagnosis Date  . Anxiety   . Complication of anesthesia    " sometimes I wake up during surgery "  . Depression   . Left renal mass 07/19/2016  . Lung cancer ([place]) dx'd 2010    NSCL CA right  . Lung cancer ([place]) dx'd 2017   left    ALLERGIES:  is allergic to chantix [varenicline].  MEDICATIONS:  Current Outpatient Medications  Medication Sig Dispense Refill  . acetaminophen (TYLENOL) 500 MG tablet Take 500 mg by mouth every 6 (six) hours as needed for mild pain, fever or headache.     . albuterol (VENTOLIN HFA) 108 (90 Base) MCG/ACT inhaler TAKE 2 PUFFS BY MOUTH EVERY 6 HOURS AS NEEDED FOR WHEEZE OR SHORTNESS OF BREATH (Patient taking differently: Inhale 2 puffs into the lungs every 6 (six) hours as needed for wheezing or shortness of breath. ) 18 g 3  . [employer] ASPIRIN EC 325 MG EC tablet TAKE 1 TABLET BY MOUTH EVERY DAY (Patient taking differently: Take 325 mg by mouth daily. ) 90 tablet 1  . Glycopyrrolate-Formoterol (BEVESPI AEROSPHERE) 9-4.8 MCG/ACT AERO Inhale 2 puffs into the lungs 2 (two) times daily. 10.7 g 11  . lactose free nutrition (BOOST) LIQD Take 237 mLs by mouth daily.    . Multiple Vitamin (MULTIVITAMIN WITH MINERALS) TABS tablet Take 1 tablet by mouth daily.    . nicotine (NICODERM CQ - DOSED IN MG/24 HOURS) 14 mg/24hr patch Place 1 patch (14 mg total) onto the skin daily.  For 6 weeks.  Then decrease to 7 mg daily. 28 patch 1  . nicotine polacrilex (NICORETTE) 4 MG gum Take 1 each (4 mg total) by mouth as needed for smoking cessation. 100 tablet 0  . tamsulosin (FLOMAX) 0.4 MG CAPS capsule Take 0.4 mg by mouth at bedtime.     No current facility-administered medications for this visit.    SURGICAL HISTORY:  Past Surgical History:  Procedure Laterality Date  . AMPUTATION Right 05/21/2012   Procedure: AMPUTATION BELOW KNEE;  Surgeon: Newt Minion, MD;  Location: [place];  Service:  Orthopedics;  Laterality: Right;  . AMPUTATION Right 06/21/2012   Procedure: Revision AMPUTATION BELOW KNEE Right;  Surgeon: Newt Minion, MD;  Location: [place];  Service: Orthopedics;  Laterality: Right;  Revision right Below Knee Amputation  . COLONOSCOPY W/ BIOPSIES AND POLYPECTOMY    . FOOT AMPUTATION Right    traumatic right lower extremity  . UPPER JAW     SURGERY FOR INFECTION    REVIEW OF SYSTEMS:  A comprehensive review of systems was negative except for: Constitutional: positive for fatigue Respiratory: positive for cough, dyspnea on exertion and sputum   PHYSICAL EXAMINATION: General appearance: alert, cooperative, fatigued and no distress Head: Normocephalic, without obvious abnormality, atraumatic Neck: no adenopathy Lymph nodes: Cervical, supraclavicular, and axillary nodes normal. Resp: clear to auscultation bilaterally Back: symmetric, no curvature. ROM normal. No CVA tenderness. Cardio: regular rate and rhythm, S1, S2 normal, no murmur, click, rub or gallop GI: soft, non-tender; bowel sounds normal; no masses,  no organomegaly Extremities: extremities normal, atraumatic, no cyanosis or edema  ECOG PERFORMANCE STATUS: 1 - Symptomatic but completely ambulatory  Blood pressure 109/80, pulse 100, temperature (!) 96.7 F (35.9 C), temperature source Tympanic, resp. rate 14, height 5' 5.5" (1.664 m), weight 109 lb 11.2 oz (49.8 kg), SpO2 99 %.  LABORATORY DATA: Lab Results  Component Value Date   WBC 7.7 10/31/2019   HGB 11.8 (L) 10/31/2019   HCT 37.4 (L) 10/31/2019   MCV 85.0 10/31/2019   PLT 504 (H) 10/31/2019      Chemistry      Component Value Date/Time   NA 135 10/31/2019 0523   NA 141 07/16/2016 1220   K 4.8 10/31/2019 0523   K 4.0 07/16/2016 1220   CL 99 10/31/2019 0523   CL 104 07/19/2012 1432   CO2 25 10/31/2019 0523   CO2 24 07/16/2016 1220   BUN 13 10/31/2019 0523   BUN 8.8 07/16/2016 1220   CREATININE 0.64 10/31/2019 0523   CREATININE 1.06  08/03/2019 1135   CREATININE 1.0 07/16/2016 1220      Component Value Date/Time   CALCIUM 8.7 (L) 10/31/2019 0523   CALCIUM 9.3 07/16/2016 1220   ALKPHOS 80 10/29/2019 0401   ALKPHOS 92 07/16/2016 1220   AST 16 10/29/2019 0401   AST 17 08/03/2019 1135   AST 18 07/16/2016 1220   ALT 16 10/29/2019 0401   ALT 16 08/03/2019 1135   ALT 14 07/16/2016 1220   BILITOT 0.4 10/29/2019 0401   BILITOT <0.2 (L) 08/03/2019 1135   BILITOT 0.39 07/16/2016 1220       RADIOGRAPHIC STUDIES: CT Head Wo Contrast  Result Date: 10/28/2019 CLINICAL DATA:  Non-small cell lung cancer now with dizziness and balance difficulties. Headache. EXAM: CT HEAD WITHOUT CONTRAST TECHNIQUE: Contiguous axial images were obtained from the base of the skull through the vertex without intravenous contrast. COMPARISON:  MRI brain 08/29/2018 FINDINGS: Brain: No mass effect  or midline shift. No abnormal extra-axial fluid collections. Gray-white matter junctions are distinct. Basal cisterns are not effaced. No acute intracranial hemorrhage. No specific evidence of an intracranial mass although contrast-enhanced MRI would be the most sensitive modality for detection of intracranial metastatic disease. Vascular: No hyperdense vessel or unexpected calcification. Skull: Calvarium appears intact.  No focal lesions identified. Sinuses/Orbits: Paranasal sinuses and mastoid air cells are clear. Other: None. IMPRESSION: No acute intracranial abnormalities. No specific evidence of an intracranial mass although contrast-enhanced MRI would be the most sensitive modality for detection of intracranial metastatic disease. Electronically Signed   By: Lucienne Capers M.D.   On: 10/28/2019 22:28   CT Angio Chest PE W and/or Wo Contrast  Result Date: 10/28/2019 CLINICAL DATA:  New onset chest pain and epigastric pain. History of lung cancer. EXAM: CT ANGIOGRAPHY CHEST CT ABDOMEN AND PELVIS WITH CONTRAST TECHNIQUE: Multidetector CT imaging of the chest  was performed using the standard protocol during bolus administration of intravenous contrast. Multiplanar CT image reconstructions and MIPs were obtained to evaluate the vascular anatomy. Multidetector CT imaging of the abdomen and pelvis was performed using the standard protocol during bolus administration of intravenous contrast. CONTRAST:  147mL OMNIPAQUE IOHEXOL 350 MG/ML SOLN COMPARISON:  CT chest 08/03/2019.  PET-CT 03/30/2019 FINDINGS: CTA CHEST FINDINGS Cardiovascular: Good opacification of the central and segmental pulmonary arteries. No focal filling defects. No evidence of significant pulmonary embolus. Normal heart size. No pericardial effusions. Normal caliber thoracic aorta. Great vessels are patent. Mild aortic calcification. 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 Mediastinum/Nodes: Right hilar soft tissue or lymphadenopathy measuring 1.7 cm diameter. Consolidation and volume loss along the medial aspect of the mediastinum bilaterally. Left anterior paramediastinal soft tissue. This is likely post radiation change. Similar appearance to previous study. Esophagus is decompressed. Moderate prominence of paraesophageal and para-aortic lymph nodes at the crus of the diaphragm, measuring up to about 1 cm short axis dimension but more prominent than on prior study. Possible metastatic disease. Lungs/Pleura: Scarring in the medial lungs is likely post radiation change. Bronchiectasis and bronchial wall thickening. New areas of airspace disease in the posterior left upper lung and in the right lung base possibly representing superimposed pneumonia. No pleural effusions. No pneumothorax. Musculoskeletal: No destructive bone lesions. Review of the MIP images confirms the above findings. CT ABDOMEN and PELVIS FINDINGS Hepatobiliary: No focal liver abnormality is seen. No gallstones, gallbladder wall thickening, or biliary dilatation. Pancreas: Unremarkable.  No pancreatic ductal dilatation or surrounding inflammatory changes. Spleen: Normal in size without focal abnormality. Adrenals/Urinary Tract: Adrenal glands are unremarkable. Kidneys are normal, without renal calculi, focal lesion, or hydronephrosis. Bladder is unremarkable. Stomach/Bowel: Stomach, small bowel, and colon are mostly decompressed with scattered stool in the colon. No inflammatory changes are appreciated. The appendix is normal. Vascular/Lymphatic: Calcification of the aorta. No aneurysm. Moderately prominent retroperitoneal para-aortic lymphadenopathy with individual lymph nodes measuring up to about 9 mm short axis dimension. This is developing since previous study and may represent metastatic disease. Reproductive: Marked enlargement of the prostate gland, measuring 7 cm in diameter. Other: No free air or free fluid in the abdomen. Abdominal wall musculature appears intact. Musculoskeletal: No destructive bone lesions. Review of the MIP images confirms the above findings. IMPRESSION: 1. No evidence of significant pulmonary embolus. 2. Consolidation and volume loss along the medial aspect of the mediastinum bilaterally, likely post radiation change. Similar appearance to previous study. 3. New areas of airspace disease in  the posterior left upper lung and in the right lung base possibly representing superimposed pneumonia. 4. Right hilar soft tissue or lymphadenopathy measuring 1.7 cm diameter, unchanged. 5. Moderate prominence of paraesophageal and para-aortic lymph nodes at the crus of the diaphragm, more prominent than on prior study. Possible metastatic disease. 6. Moderately prominent retroperitoneal para-aortic lymph nodes measuring up to about 9 mm short axis dimension, developing since previous study and may represent metastatic disease. 7. Marked enlargement of the prostate gland. 8. 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 9. Aortic  atherosclerosis. Aortic Atherosclerosis (ICD10-I70.0). Electronically Signed   By: Lucienne Capers M.D.   On: 10/28/2019 22:22   CT ABDOMEN PELVIS W CONTRAST  Result Date: 10/28/2019 CLINICAL DATA:  New onset chest pain and epigastric pain. History of lung cancer. EXAM: CT ANGIOGRAPHY CHEST CT ABDOMEN AND PELVIS WITH CONTRAST TECHNIQUE: Multidetector CT imaging of the chest was performed using the standard protocol during bolus administration of intravenous contrast. Multiplanar CT image reconstructions and MIPs were obtained to evaluate the vascular anatomy. Multidetector CT imaging of the abdomen and pelvis was performed using the standard protocol during bolus administration of intravenous contrast. CONTRAST:  150mL OMNIPAQUE IOHEXOL 350 MG/ML SOLN COMPARISON:  CT chest 08/03/2019.  PET-CT 03/30/2019 FINDINGS: CTA CHEST FINDINGS Cardiovascular: Good opacification of the central and segmental pulmonary arteries. No focal filling defects. No evidence of significant pulmonary embolus. Normal heart size. No pericardial effusions. Normal caliber thoracic aorta. Great vessels are patent. Mild aortic calcification. 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 Mediastinum/Nodes: Right hilar soft tissue or lymphadenopathy measuring 1.7 cm diameter. Consolidation and volume loss along the medial aspect of the mediastinum bilaterally. Left anterior paramediastinal soft tissue. This is likely post radiation change. Similar appearance to previous study. Esophagus is decompressed. Moderate prominence of paraesophageal and para-aortic lymph nodes at the crus of the diaphragm, measuring up to about 1 cm short axis dimension but more prominent than on prior study. Possible metastatic disease. Lungs/Pleura: Scarring in the medial lungs is likely post radiation change. Bronchiectasis and bronchial wall thickening. New areas of airspace disease in the posterior left upper lung and  in the right lung base possibly representing superimposed pneumonia. No pleural effusions. No pneumothorax. Musculoskeletal: No destructive bone lesions. Review of the MIP images confirms the above findings. CT ABDOMEN and PELVIS FINDINGS Hepatobiliary: No focal liver abnormality is seen. No gallstones, gallbladder wall thickening, or biliary dilatation. Pancreas: Unremarkable. No pancreatic ductal dilatation or surrounding inflammatory changes. Spleen: Normal in size without focal abnormality. Adrenals/Urinary Tract: Adrenal glands are unremarkable. Kidneys are normal, without renal calculi, focal lesion, or hydronephrosis. Bladder is unremarkable. Stomach/Bowel: Stomach, small bowel, and colon are mostly decompressed with scattered stool in the colon. No inflammatory changes are appreciated. The appendix is normal. Vascular/Lymphatic: Calcification of the aorta. No aneurysm. Moderately prominent retroperitoneal para-aortic lymphadenopathy with individual lymph nodes measuring up to about 9 mm short axis dimension. This is developing since previous study and may represent metastatic disease. Reproductive: Marked enlargement of the prostate gland, measuring 7 cm in diameter. Other: No free air or free fluid in the abdomen. Abdominal wall musculature appears intact. Musculoskeletal: No destructive bone lesions. Review of the MIP images confirms the above findings. IMPRESSION: 1. No evidence of significant pulmonary embolus. 2. Consolidation and volume loss along the medial aspect of the mediastinum bilaterally, likely post radiation change. Similar appearance to previous study. 3. New areas of  airspace disease in the posterior left upper lung and in the right lung base possibly representing superimposed pneumonia. 4. Right hilar soft tissue or lymphadenopathy measuring 1.7 cm diameter, unchanged. 5. Moderate prominence of paraesophageal and para-aortic lymph nodes at the crus of the diaphragm, more prominent than on  prior study. Possible metastatic disease. 6. Moderately prominent retroperitoneal para-aortic lymph nodes measuring up to about 9 mm short axis dimension, developing since previous study and may represent metastatic disease. 7. Marked enlargement of the prostate gland. 8. 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 9. Aortic atherosclerosis. Aortic Atherosclerosis (ICD10-I70.0). Electronically Signed   By: Lucienne Capers M.D.   On: 10/28/2019 22:22   DG Chest Portable 1 View  Result Date: 10/28/2019 CLINICAL DATA:  Shortness of breath.  History of lung cancer. EXAM: PORTABLE CHEST 1 VIEW COMPARISON:  CT chest dated July 04, 2019 FINDINGS: Persistent medial bilateral upper lung zone airspace opacification is noted which is likely related to prior treatment. There are parent new opacities in the left upper lung zone and right infrahilar region. The heart size appears stable. Aortic calcifications are noted. There is no pneumothorax. There is stable elevation of the left hemidiaphragm. No definite acute osseous abnormality. IMPRESSION: 1. Suggestion of new airspace opacities in the right infrahilar region and left upper lobe which may indicate developing infiltrates in the appropriate clinical setting. 2. Persistent post treatment changes in the bilateral upper lung zones. 3. Stable elevation of the left hemidiaphragm. 4. No convincing large pleural effusion. Electronically Signed   By: Constance Holster M.D.   On: 10/28/2019 18:10    ASSESSMENT AND PLAN:  This is a very pleasant 69 years old African-American male with history of 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 The patient has disease recurrence on the left side presented as a stage IIIa non-small cell lung cancer, squamous cell carcinoma status post concurrent chemoradiation followed  by consolidation chemotherapy. Completed in March 2017. He also underwent SBRT to the right middle lobe lung nodule under the care of Dr. [name] completed on 07/24/2018. He also underwent SBRT to left lower lobe lung nodule under the care of Dr. [name] completed on April 27, 2019. The patient is currently on observation and he continues to do fine except for the recent diagnosis was pneumonia. He had repeat CT scan of the head, chest, abdomen pelvis.  I personally and independently reviewed the scan images and discussed the results with the patient and his son. His scan showed no concerning findings for disease progression. I recommended for him to continue on observation with repeat CT scan of the chest in 6 months. For the recent pneumonia and history of COPD, he would continue his routine follow-up visit and evaluation by Dr. Elsworth Soho and his primary care physician. The patient was advised to call immediately if he has any concerning symptoms in the interval.  All questions were answered. The patient knows to call the clinic with any problems, questions or concerns. We can certainly see the patient much sooner if necessary.   Disclaimer: This note was dictated with voice recognition software. Similar sounding words can inadvertently be transcribed and may not be corrected upon review.

## 2019-11-06 NOTE — Telephone Encounter (Signed)
Spoke with pt's son, Vania Rea. Pt has been scheduled for a HFU on 11/07/19 at 1530 with Beth. Nothing further was needed.

## 2019-11-07 ENCOUNTER — Encounter (HOSPITAL_COMMUNITY): Payer: Self-pay

## 2019-11-07 ENCOUNTER — Other Ambulatory Visit: Payer: Self-pay

## 2019-11-07 ENCOUNTER — Emergency Department (HOSPITAL_COMMUNITY)
Admission: EM | Admit: 2019-11-07 | Discharge: 2019-11-07 | Disposition: A | Discharge status: Home / Self Care | Payer: Medicare PPO | Attending: Emergency Medicine | Admitting: Emergency Medicine

## 2019-11-07 ENCOUNTER — Inpatient Hospital Stay: Payer: Medicare PPO | Admitting: Primary Care

## 2019-11-07 DIAGNOSIS — J449 Chronic obstructive pulmonary disease, unspecified: Secondary | ICD-10-CM | POA: Insufficient documentation

## 2019-11-07 DIAGNOSIS — Z7982 Long term (current) use of aspirin: Secondary | ICD-10-CM | POA: Insufficient documentation

## 2019-11-07 DIAGNOSIS — R339 Retention of urine, unspecified: Secondary | ICD-10-CM | POA: Insufficient documentation

## 2019-11-07 DIAGNOSIS — Z79899 Other long term (current) drug therapy: Secondary | ICD-10-CM | POA: Insufficient documentation

## 2019-11-07 DIAGNOSIS — Z87891 Personal history of nicotine dependence: Secondary | ICD-10-CM | POA: Insufficient documentation

## 2019-11-07 DIAGNOSIS — I10 Essential (primary) hypertension: Secondary | ICD-10-CM | POA: Insufficient documentation

## 2019-11-07 DIAGNOSIS — Z85118 Personal history of other malignant neoplasm of bronchus and lung: Secondary | ICD-10-CM | POA: Diagnosis not present

## 2019-11-07 DIAGNOSIS — N400 Enlarged prostate without lower urinary tract symptoms: Secondary | ICD-10-CM | POA: Diagnosis not present

## 2019-11-07 LAB — URINALYSIS, ROUTINE W REFLEX MICROSCOPIC
Bilirubin Urine: NEGATIVE
Glucose, UA: NEGATIVE mg/dL
Hgb urine dipstick: NEGATIVE
Ketones, ur: NEGATIVE mg/dL
Leukocytes,Ua: NEGATIVE
Nitrite: NEGATIVE
Protein, ur: NEGATIVE mg/dL
Specific Gravity, Urine: 1.016 (ref 1.005–1.030)
pH: 7 (ref 5.0–8.0)

## 2019-11-07 NOTE — ED Triage Notes (Signed)
Pt arrived via walk in, c/o urinary retntion x1 day. States this has happened before and he often needs to be cathed. Pt has appt tomorrow to follow up with urologist about enlarged prostate

## 2019-11-07 NOTE — Discharge Instructions (Signed)
You were seen in the ER for urinary retention  You have a 14 French Foley  Keep this in until you see your urologist.  Call them to move your appointment up sooner.  Return to the ER for Foley complications, fever greater than 100.4, inability to urinate, lower abdominal or flank pain.

## 2019-11-07 NOTE — ED Provider Notes (Signed)
[facility] DEPT Provider Note   CSN: [id number] Arrival date & time: 11/07/19  1639     History Chief Complaint  Patient presents with   Urinary Retention    Evan [name] is a 69 y.o. male with history of non-small cell lung cancer status post chemoradiation currently on remission, BPH, previous urinary retention requiring Foley catheter presents to the ER for evaluation of unable to urinate since 1:30 PM today.  He has intense lower abdominal discomfort and intense urge to urinate.  Has tried to strain but nothing comes out.  Patient is very uncomfortable during exam.  He is actively trying to urinate in a urinal but without any voiding.  Denies any fevers, nausea, vomiting.  Denies any back or flank pain.  Patient has an upcoming appointment with urology on 10/14.  He called the office earlier today and was told to come to this ED.  Patient noted to have a wet cough during exam.  When asked about this both patient and his son at bedside state that his cough is actually better than previously.  Son states that it is from COPD.  Recent CTAP showed enlarged prostate. Patient is awaiting biopsy by urology.   HPI     Past Medical History:  Diagnosis Date   Anxiety    Complication of anesthesia    " sometimes I wake up during surgery "   Depression    Left renal mass 07/19/2016   Lung cancer ([place]) dx'd 2010    NSCL CA right   Lung cancer ([place]) dx'd 2017   left    Patient Active Problem List   Diagnosis Date Noted   Constipation 10/29/2019   Asymptomatic bacteriuria 10/29/2019   Pneumonia 10/28/2019   Primary cancer of left lower lobe of lung ([place]) 04/06/2019   Involuntary movements 08/24/2018   Primary cancer of right middle lobe of lung ([place]) 07/16/2018   Nodule of middle lobe of right lung 06/20/2018   Bilateral carotid artery stenosis 02/18/2017   Left renal mass 07/19/2016   Phlebitis 02/04/2015   Full code status  11/04/2014   Encounter for antineoplastic chemotherapy 11/04/2014   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, stage III - 2016 09/10/2014   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   TOBACCO ABUSE 04/02/2008   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 02/09/2008   HYPERLIPIDEMIA 02/09/2008   DEPRESSION 02/09/2008   Essential hypertension 02/09/2008   COPD (chronic obstructive pulmonary disease) ([place]) 02/09/2008   HEADACHE 02/09/2008    Past Surgical History:  Procedure Laterality Date   AMPUTATION Right 05/21/2012   Procedure: AMPUTATION BELOW KNEE;  Surgeon: Newt Minion, MD;  Location: [facility];  Service: Orthopedics;  Laterality: Right;   AMPUTATION Right 06/21/2012   Procedure: Revision AMPUTATION BELOW KNEE Right;  Surgeon: Newt Minion, MD;  Location: [facility];  Service: Orthopedics;  Laterality: Right;  Revision right Below Knee Amputation   COLONOSCOPY W/ BIOPSIES AND POLYPECTOMY     FOOT AMPUTATION Right    traumatic right lower extremity   UPPER JAW     SURGERY FOR INFECTION       Family History  Problem Relation Age of Onset   Heart disease Mother    Diabetes Mother    Prostate cancer Father     Social History   Tobacco Use   Smoking status: Former Smoker    Packs/day: 0.50    Years: 45.00    Pack  years: 22.50    Types: Cigarettes    Quit date: 04/02/2019    Years since quitting: 0.6   Smokeless tobacco: Never Used  Vaping Use   Vaping Use: Every day  Substance Use Topics   Alcohol use: No    Alcohol/week: 0.0 standard drinks    Comment: rare   Drug use: No    Home Medications Prior to Admission medications   Medication Sig Start Date End Date Taking? Authorizing Provider  acetaminophen (TYLENOL) 500 MG tablet Take 500 mg by mouth every 6 (six) hours as needed for mild pain, fever or headache.     [provider]  albuterol (VENTOLIN HFA) 108 (90 Base) MCG/ACT inhaler TAKE 2 PUFFS BY MOUTH EVERY 6 HOURS  AS NEEDED FOR WHEEZE OR SHORTNESS OF BREATH Patient taking differently: Inhale 2 puffs into the lungs every 6 (six) hours as needed for wheezing or shortness of breath.  08/30/19   Rigoberto Noel, MD  [employer] ASPIRIN EC 325 MG EC tablet TAKE 1 TABLET BY MOUTH EVERY DAY Patient taking differently: Take 325 mg by mouth daily.  08/16/19   Vaslow, Acey Lav, MD  Glycopyrrolate-Formoterol (BEVESPI AEROSPHERE) 9-4.8 MCG/ACT AERO Inhale 2 puffs into the lungs 2 (two) times daily. 06/26/19   Rigoberto Noel, MD  lactose free nutrition (BOOST) LIQD Take 237 mLs by mouth daily.    [provider]  Multiple Vitamin (MULTIVITAMIN WITH MINERALS) TABS tablet Take 1 tablet by mouth daily.    [provider]  nicotine (NICODERM CQ - DOSED IN MG/24 HOURS) 14 mg/24hr patch Place 1 patch (14 mg total) onto the skin daily. For 6 weeks.  Then decrease to 7 mg daily. 07/25/19   Yopp, Amber C, RPH-CPP  nicotine polacrilex (NICORETTE) 4 MG gum Take 1 each (4 mg total) by mouth as needed for smoking cessation. 07/25/19   Yopp, Amber C, RPH-CPP  tamsulosin (FLOMAX) 0.4 MG CAPS capsule Take 0.4 mg by mouth at bedtime. 10/10/19   [provider]    Allergies    Chantix [varenicline]  Review of Systems   Review of Systems  Gastrointestinal: Positive for abdominal pain.  Genitourinary: Positive for difficulty urinating.  All other systems reviewed and are negative.   Physical Exam Updated Vital Signs BP 95/61    Pulse (!) 102    Temp 97.9 F (36.6 C) (Oral)    Resp 14    SpO2 95%   Physical Exam Vitals and nursing note reviewed.  Constitutional:      General: He is not in acute distress.    Appearance: He is well-developed.     Comments: NAD.  HENT:     Head: Normocephalic and atraumatic.     Right Ear: External ear normal.     Left Ear: External ear normal.     Nose: Nose normal.  Eyes:     General: No scleral icterus.    Conjunctiva/sclera: Conjunctivae normal.  Cardiovascular:     Rate  and Rhythm: Normal rate and regular rhythm.     Heart sounds: Normal heart sounds.  Pulmonary:     Effort: Pulmonary effort is normal.     Breath sounds: Normal breath sounds.  Abdominal:     General: There is distension.     Tenderness: There is abdominal tenderness.     Comments: Moderate distention suprapubic area, firmness with tenderness.  Genitourinary:    Comments: Uncircumcised male, external genitalia normal. Musculoskeletal:        General:  No deformity. Normal range of motion.     Cervical back: Normal range of motion and neck supple.  Skin:    General: Skin is warm and dry.     Capillary Refill: Capillary refill takes less than 2 seconds.  Neurological:     Mental Status: He is alert and oriented to person, place, and time.  Psychiatric:        Behavior: Behavior normal.        Thought Content: Thought content normal.        Judgment: Judgment normal.     ED Results / Procedures / Treatments   Labs (all labs ordered are listed, but only abnormal results are displayed) Labs Reviewed  URINE CULTURE  URINALYSIS, ROUTINE W REFLEX MICROSCOPIC    EKG None  Radiology No results found.  Procedures Procedures (including critical care time)  Medications Ordered in ED Medications - No data to display  ED Course  I have reviewed the triage vital signs and the nursing notes.  Pertinent labs & imaging results that were available during my care of the patient were reviewed by me and considered in my medical decision making (see chart for details).    MDM Rules/Calculators/A&P                          69 year old male with pertinent past medical history of BPH, in the past requiring Foley for urinary retention, presents to the ER for acute inability to urinate at 1:30 PM.  He is awaiting urology appointment 10/14, biopsy of bladder mass.  History of lung cancer currently on remission.  Denies preceding UTI symptoms like fever, dysuria, lower abdominal or flank pain.   Patient's EMR, triage and nursing notes reviewed to assist with more history.  He called his urologist and was instructed to come to the ER at [facility] for Foley.  68 French Foley inserted by me under [place] supervision with spontaneous urine flow and significant improvement/resolution of symptoms.  Patient will be discharged with Foley.  Instructed they call urology to move up their appointment.  Patient denied any symptoms of UTI prior to this event today but son wanted [place] to send urine to make sure he did not have a UTI.  Urinalysis and urine culture pending and discharge.  Clinical presentation today is classic of urinary retention likely from BPH and less likely of UTI.  No indication for antibiotics at this time.  Return precautions discussed with patient and son who are in agreement. Final Clinical Impression(s) / ED Diagnoses Final diagnoses:  Urinary retention    Rx / DC Orders ED Discharge Orders    None       Arlean Hopping 11/07/19 1832    Davonna Belling, MD 11/07/19 2230

## 2019-11-07 NOTE — ED Notes (Signed)
Pt. Discharged patient received AVS from other nurse. Called son to confirm he got his fathers paperwork and his IV has been taken out. IV out and AVS received. No other concerns at this time.

## 2019-11-08 ENCOUNTER — Emergency Department (HOSPITAL_COMMUNITY)
Admission: EM | Admit: 2019-11-08 | Discharge: 2019-11-08 | Disposition: A | Discharge status: Home / Self Care | Payer: Medicare PPO | Attending: Emergency Medicine | Admitting: Emergency Medicine

## 2019-11-08 ENCOUNTER — Encounter (HOSPITAL_COMMUNITY): Payer: Self-pay

## 2019-11-08 DIAGNOSIS — I10 Essential (primary) hypertension: Secondary | ICD-10-CM | POA: Diagnosis not present

## 2019-11-08 DIAGNOSIS — J449 Chronic obstructive pulmonary disease, unspecified: Secondary | ICD-10-CM | POA: Insufficient documentation

## 2019-11-08 DIAGNOSIS — Z85118 Personal history of other malignant neoplasm of bronchus and lung: Secondary | ICD-10-CM | POA: Diagnosis not present

## 2019-11-08 DIAGNOSIS — Z87891 Personal history of nicotine dependence: Secondary | ICD-10-CM | POA: Diagnosis not present

## 2019-11-08 DIAGNOSIS — R319 Hematuria, unspecified: Secondary | ICD-10-CM | POA: Insufficient documentation

## 2019-11-08 LAB — URINALYSIS, ROUTINE W REFLEX MICROSCOPIC: RBC / HPF: >50 RBC/hpf — ABNORMAL HIGH (ref 0–5)

## 2019-11-08 LAB — URINE CULTURE: Culture: NO GROWTH

## 2019-11-08 NOTE — Discharge Instructions (Addendum)
Keep your appointment with your urologist tomorrow

## 2019-11-08 NOTE — ED Provider Notes (Signed)
[facility] DEPT Provider Note   CSN: [id number] Arrival date & time: 11/08/19  1104     History Chief Complaint  Patient presents with  . Hematuria    Evan [name] is a 69 y.o. male.  69 year old male presents with hematuria x1 day.  Seen here yesterday for urinary retention and had a catheter placed and had good drainage with yellow urine.  Denies any fever or chills.  No flank pain.  Patient has a history of retention in the past and is scheduled to see a urologist tomorrow for a biopsy.  Denies any other symptoms.  Does not take any blood thinners.        Past Medical History:  Diagnosis Date  . Anxiety   . Complication of anesthesia    " sometimes I wake up during surgery "  . Depression   . Left renal mass 07/19/2016  . Lung cancer ([place]) dx'd 2010    NSCL CA right  . Lung cancer ([place]) dx'd 2017   left    Patient Active Problem List   Diagnosis Date Noted  . Constipation 10/29/2019  . Asymptomatic bacteriuria 10/29/2019  . Pneumonia 10/28/2019  . Primary cancer of left lower lobe of lung ([place]) 04/06/2019  . Involuntary movements 08/24/2018  . Primary cancer of right middle lobe of lung ([place]) 07/16/2018  . Nodule of middle lobe of right lung 06/20/2018  . Bilateral carotid artery stenosis 02/18/2017  . Left renal mass 07/19/2016  . Phlebitis 02/04/2015  . Full code status 11/04/2014  . Encounter for antineoplastic chemotherapy 11/04/2014  . 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 TOBACCO ABUSE 04/02/2008  . 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 HYPERLIPIDEMIA 02/09/2008  . DEPRESSION 02/09/2008  . Essential hypertension 02/09/2008  . COPD (chronic obstructive pulmonary disease) ([place]) 02/09/2008  . HEADACHE 02/09/2008    Past Surgical History:  Procedure Laterality Date  . AMPUTATION Right 05/21/2012    Procedure: AMPUTATION BELOW KNEE;  Surgeon: Newt Minion, MD;  Location: [facility];  Service: Orthopedics;  Laterality: Right;  . AMPUTATION Right 06/21/2012   Procedure: Revision AMPUTATION BELOW KNEE Right;  Surgeon: Newt Minion, MD;  Location: [facility];  Service: Orthopedics;  Laterality: Right;  Revision right Below Knee Amputation  . COLONOSCOPY W/ BIOPSIES AND POLYPECTOMY    . FOOT AMPUTATION Right    traumatic right lower extremity  . UPPER JAW     SURGERY FOR INFECTION       Family History  Problem Relation Age of Onset  . Heart disease Mother   . Diabetes Mother   . Prostate cancer Father     Social History   Tobacco Use  . Smoking status: Former Smoker    Packs/day: 0.50    Years: 45.00    Pack years: 22.50    Types: Cigarettes    Quit date: 04/02/2019    Years since quitting: 0.6  . Smokeless tobacco: Never Used  Vaping Use  . Vaping Use: Every day  Substance Use Topics  . Alcohol use: No    Alcohol/week: 0.0 standard drinks    Comment: rare  . Drug use: No    Home Medications Prior to Admission medications   Medication Sig Start Date End Date Taking? Authorizing Provider  acetaminophen (TYLENOL) 500 MG tablet Take 500 mg by mouth every 6 (six) hours  as needed for mild pain, fever or headache.    Yes [provider]  albuterol (VENTOLIN HFA) 108 (90 Base) MCG/ACT inhaler TAKE 2 PUFFS BY MOUTH EVERY 6 HOURS AS NEEDED FOR WHEEZE OR SHORTNESS OF BREATH Patient taking differently: Inhale 2 puffs into the lungs every 6 (six) hours as needed for wheezing or shortness of breath.  08/30/19  Yes Rigoberto Noel, MD  Glycopyrrolate-Formoterol (BEVESPI AEROSPHERE) 9-4.8 MCG/ACT AERO Inhale 2 puffs into the lungs 2 (two) times daily. 06/26/19  Yes Rigoberto Noel, MD  lactose free nutrition (BOOST) LIQD Take 237 mLs by mouth daily.   Yes [provider]  Multiple Vitamin (MULTIVITAMIN WITH MINERALS) TABS tablet Take 1 tablet by mouth daily.   Yes [provider]  nicotine (NICODERM CQ - DOSED IN MG/24 HOURS) 14 mg/24hr patch Place 1 patch (14 mg total) onto the skin daily. For 6 weeks.  Then decrease to 7 mg daily. 07/25/19  Yes Yopp, Amber C, RPH-CPP  nicotine polacrilex (NICORETTE) 4 MG gum Take 1 each (4 mg total) by mouth as needed for smoking cessation. 07/25/19  Yes Yopp, Amber C, RPH-CPP  tamsulosin (FLOMAX) 0.4 MG CAPS capsule Take 0.4 mg by mouth at bedtime. 10/10/19  Yes [provider]  [employer] ASPIRIN EC 325 MG EC tablet TAKE 1 TABLET BY MOUTH EVERY DAY Patient not taking: Reported on 11/08/2019 08/16/19   Ventura Sellers, MD    Allergies    Chantix [varenicline]  Review of Systems   Review of Systems  All other systems reviewed and are negative.   Physical Exam Updated Vital Signs BP 135/87 (BP Location: Right Arm)   Pulse (!) 104   Temp 98 F (36.7 C) (Oral)   Resp 17   SpO2 100%   Physical Exam Vitals and nursing note reviewed.  Constitutional:      General: He is not in acute distress.    Appearance: Normal appearance. He is well-developed. He is not toxic-appearing.  HENT:     Head: Normocephalic and atraumatic.  Eyes:     General: Lids are normal.     Conjunctiva/sclera: Conjunctivae normal.     Pupils: Pupils are equal, round, and reactive to light.  Neck:     Thyroid: No thyroid mass.     Trachea: No tracheal deviation.  Cardiovascular:     Rate and Rhythm: Normal rate and regular rhythm.     Heart sounds: Normal heart sounds. No murmur heard.  No gallop.   Pulmonary:     Effort: Pulmonary effort is normal. No respiratory distress.     Breath sounds: Normal breath sounds. No stridor. No decreased breath sounds, wheezing, rhonchi or rales.  Abdominal:     General: Bowel sounds are normal. There is no distension.     Palpations: Abdomen is soft.     Tenderness: There is no abdominal tenderness. There is no rebound.     Comments: No suprapubic tenderness  Musculoskeletal:        General: No  tenderness. Normal range of motion.     Cervical back: Normal range of motion and neck supple.  Skin:    General: Skin is warm and dry.     Findings: No abrasion or rash.  Neurological:     Mental Status: He is alert and oriented to person, place, and time.     GCS: GCS eye subscore is 4. GCS verbal subscore is 5. GCS motor subscore is 6.  Cranial Nerves: No cranial nerve deficit.     Sensory: No sensory deficit.  Psychiatric:        Speech: Speech normal.        Behavior: Behavior normal.     ED Results / Procedures / Treatments   Labs (all labs ordered are listed, but only abnormal results are displayed) Labs Reviewed  URINE CULTURE  URINALYSIS, ROUTINE W REFLEX MICROSCOPIC    EKG None  Radiology No results found.  Procedures Procedures (including critical care time)  Medications Ordered in ED Medications - No data to display  ED Course  I have reviewed the triage vital signs and the nursing notes.  Pertinent labs & imaging results that were available during my care of the patient were reviewed by me and considered in my medical decision making (see chart for details).    MDM Rules/Calculators/A&P                          Urinalysis shows hematuria but no signs of infection.  Patient has appointment to see his urologist tomorrow and Foley catheter is already in place Final Clinical Impression(s) / ED Diagnoses Final diagnoses:  None    Rx / DC Orders ED Discharge Orders    None       Lacretia Leigh, MD 11/08/19 1409

## 2019-11-08 NOTE — ED Triage Notes (Signed)
Pt presents with c/o hematuria that he noticed in his catheter since last night. Pt was seen yesterday for urinary retention.

## 2019-11-09 ENCOUNTER — Encounter: Payer: Self-pay | Admitting: Primary Care

## 2019-11-09 ENCOUNTER — Other Ambulatory Visit: Payer: Self-pay

## 2019-11-09 ENCOUNTER — Emergency Department (HOSPITAL_COMMUNITY)
Admission: EM | Admit: 2019-11-09 | Discharge: 2019-11-09 | Disposition: A | Discharge status: Home / Self Care | Payer: Medicare PPO | Attending: Emergency Medicine | Admitting: Emergency Medicine

## 2019-11-09 ENCOUNTER — Ambulatory Visit (INDEPENDENT_AMBULATORY_CARE_PROVIDER_SITE_OTHER): Payer: Medicare PPO | Admitting: Primary Care

## 2019-11-09 ENCOUNTER — Telehealth: Payer: Self-pay | Admitting: Pulmonary Disease

## 2019-11-09 ENCOUNTER — Ambulatory Visit (INDEPENDENT_AMBULATORY_CARE_PROVIDER_SITE_OTHER): Payer: Medicare PPO

## 2019-11-09 ENCOUNTER — Encounter (HOSPITAL_COMMUNITY): Payer: Self-pay

## 2019-11-09 VITALS — BP 98/68 | HR 111 | Temp 97.6°F | Ht 65.0 in | Wt 108.0 lb

## 2019-11-09 DIAGNOSIS — R319 Hematuria, unspecified: Secondary | ICD-10-CM | POA: Diagnosis not present

## 2019-11-09 DIAGNOSIS — Z87891 Personal history of nicotine dependence: Secondary | ICD-10-CM | POA: Diagnosis not present

## 2019-11-09 DIAGNOSIS — I10 Essential (primary) hypertension: Secondary | ICD-10-CM | POA: Diagnosis not present

## 2019-11-09 DIAGNOSIS — Z85118 Personal history of other malignant neoplasm of bronchus and lung: Secondary | ICD-10-CM | POA: Diagnosis not present

## 2019-11-09 DIAGNOSIS — J189 Pneumonia, unspecified organism: Secondary | ICD-10-CM

## 2019-11-09 DIAGNOSIS — J449 Chronic obstructive pulmonary disease, unspecified: Secondary | ICD-10-CM

## 2019-11-09 DIAGNOSIS — R339 Retention of urine, unspecified: Secondary | ICD-10-CM | POA: Diagnosis present

## 2019-11-09 LAB — CBC
HCT: 35.7 % — ABNORMAL LOW (ref 39.0–52.0)
Hemoglobin: 11.6 g/dL — ABNORMAL LOW (ref 13.0–17.0)
MCH: 27.4 pg (ref 26.0–34.0)
MCHC: 32.5 g/dL (ref 30.0–36.0)
MCV: 84.4 fL (ref 80.0–100.0)
Platelets: 440 10*3/uL — ABNORMAL HIGH (ref 150–400)
RBC: 4.23 MIL/uL (ref 4.22–5.81)
RDW: 14.8 % (ref 11.5–15.5)
WBC: 8 10*3/uL (ref 4.0–10.5)
nRBC: 0 % (ref 0.0–0.2)

## 2019-11-09 LAB — BASIC METABOLIC PANEL
Anion gap: 9 (ref 5–15)
BUN: 22 mg/dL (ref 8–23)
CO2: 24 mmol/L (ref 22–32)
Calcium: 8.7 mg/dL — ABNORMAL LOW (ref 8.9–10.3)
Chloride: 102 mmol/L (ref 98–111)
Creatinine, Ser: 0.76 mg/dL (ref 0.61–1.24)
GFR, Estimated: >60 mL/min (ref >60)
Glucose, Bld: 112 mg/dL — ABNORMAL HIGH (ref 70–99)
Potassium: 4.2 mmol/L (ref 3.5–5.1)
Sodium: 135 mmol/L (ref 135–145)

## 2019-11-09 LAB — URINE CULTURE: Culture: NO GROWTH

## 2019-11-09 MED ORDER — IPRATROPIUM-ALBUTEROL 0.5-2.5 (3) MG/3ML IN SOLN: 3.0000 mL | Freq: Four times a day (QID) | RESPIRATORY_TRACT | 0 refills | Status: DC | PRN

## 2019-11-09 MED ORDER — LEVOFLOXACIN 500 MG PO TABS: 500.0000 mg | ORAL_TABLET | Freq: Every day | ORAL | 0 refills | Status: DC

## 2019-11-09 MED ORDER — BUDESONIDE 0.5 MG/2ML IN SUSP: 0.5000 mg | Freq: Two times a day (BID) | RESPIRATORY_TRACT | 0 refills | Status: DC

## 2019-11-09 MED ORDER — BENZONATATE 100 MG PO CAPS: 100.0000 mg | ORAL_CAPSULE | Freq: Three times a day (TID) | ORAL | 0 refills | Status: AC

## 2019-11-09 NOTE — ED Triage Notes (Addendum)
Pt seen yesterday for hematuria and catheter change. Today more blood noted and urine not draining. Sent here to evaluate for clots occluding. Recently seen right sided pna. Pt sts he was having productive cough.

## 2019-11-09 NOTE — ED Provider Notes (Signed)
[facility] DEPT Provider Note   CSN: [id number] Arrival date & time: 11/09/19  2031     History Chief Complaint  Patient presents with  . Urinary Retention  . Cough    Evan [name] is a 69 y.o. male.  HPI   Patient presents to the ED for evaluation of hematuria.  Patient had a Foley catheter placed on October 6 for urinary retention.  Patient came back to the ED yesterday because he started noticed blood in the urine.  They noticed clots in the catheter this evening and were concerned that the catheter was not draining properly.  He came back to the ED for that evaluation.  Patient's not having any abdominal pain.  He has been coughing recently and was diagnosed with pneumonia today and started on antibiotics.  Past Medical History:  Diagnosis Date  . Anxiety   . Complication of anesthesia    " sometimes I wake up during surgery "  . Depression   . Left renal mass 07/19/2016  . Lung cancer ([place]) dx'd 2010    NSCL CA right  . Lung cancer ([place]) dx'd 2017   left    Patient Active Problem List   Diagnosis Date Noted  . Constipation 10/29/2019  . Asymptomatic bacteriuria 10/29/2019  . Pneumonia 10/28/2019  . Primary cancer of left lower lobe of lung ([place]) 04/06/2019  . Involuntary movements 08/24/2018  . Primary cancer of right middle lobe of lung ([place]) 07/16/2018  . Nodule of middle lobe of right lung 06/20/2018  . Bilateral carotid artery stenosis 02/18/2017  . Left renal mass 07/19/2016  . Phlebitis 02/04/2015  . Full code status 11/04/2014  . Encounter for antineoplastic chemotherapy 11/04/2014  . 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 TOBACCO ABUSE 04/02/2008  . 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 HYPERLIPIDEMIA 02/09/2008  . DEPRESSION 02/09/2008  . Essential hypertension 02/09/2008  . COPD (chronic obstructive  pulmonary disease) ([place]) 02/09/2008  . HEADACHE 02/09/2008    Past Surgical History:  Procedure Laterality Date  . AMPUTATION Right 05/21/2012   Procedure: AMPUTATION BELOW KNEE;  Surgeon: Newt Minion, MD;  Location: [facility];  Service: Orthopedics;  Laterality: Right;  . AMPUTATION Right 06/21/2012   Procedure: Revision AMPUTATION BELOW KNEE Right;  Surgeon: Newt Minion, MD;  Location: [facility];  Service: Orthopedics;  Laterality: Right;  Revision right Below Knee Amputation  . COLONOSCOPY W/ BIOPSIES AND POLYPECTOMY    . FOOT AMPUTATION Right    traumatic right lower extremity  . UPPER JAW     SURGERY FOR INFECTION       Family History  Problem Relation Age of Onset  . Heart disease Mother   . Diabetes Mother   . Prostate cancer Father     Social History   Tobacco Use  . Smoking status: Former Smoker    Packs/day: 0.50    Years: 45.00    Pack years: 22.50    Types: Cigarettes    Quit date: 04/02/2019    Years since quitting: 0.6  . Smokeless tobacco: Never Used  Vaping Use  . Vaping Use: Former  Substance Use Topics  . Alcohol use: No    Alcohol/week: 0.0 standard drinks    Comment: rare  . Drug use: No    Home Medications Prior to Admission medications   Medication Sig Start  Date End Date Taking? Authorizing Provider  acetaminophen (TYLENOL) 500 MG tablet Take 500 mg by mouth every 6 (six) hours as needed for mild pain, fever or headache.     [provider]  albuterol (VENTOLIN HFA) 108 (90 Base) MCG/ACT inhaler TAKE 2 PUFFS BY MOUTH EVERY 6 HOURS AS NEEDED FOR WHEEZE OR SHORTNESS OF BREATH Patient taking differently: Inhale 2 puffs into the lungs every 6 (six) hours as needed for wheezing or shortness of breath.  08/30/19   Rigoberto Noel, MD  amoxicillin-clavulanate (AUGMENTIN) 875-125 MG tablet Take 1 tablet by mouth 2 (two) times daily.    [provider]  benzonatate (TESSALON) 100 MG capsule Take 1 capsule (100 mg total) by mouth every 8  (eight) hours. 11/09/19   Dorie Rank, MD  budesonide (PULMICORT) 0.5 MG/2ML nebulizer solution Take 2 mLs (0.5 mg total) by nebulization 2 (two) times daily. 11/09/19   Martyn Ehrich, NP  [facility] ASPIRIN EC 325 MG EC tablet TAKE 1 TABLET BY MOUTH EVERY DAY Patient not taking: Reported on 11/08/2019 08/16/19   Ventura Sellers, MD  Glycopyrrolate-Formoterol (BEVESPI AEROSPHERE) 9-4.8 MCG/ACT AERO Inhale 2 puffs into the lungs 2 (two) times daily. 06/26/19   Rigoberto Noel, MD  ipratropium-albuterol (DUONEB) 0.5-2.5 (3) MG/3ML SOLN Take 3 mLs by nebulization every 6 (six) hours as needed. 11/09/19   Martyn Ehrich, NP  lactose free nutrition (BOOST) LIQD Take 237 mLs by mouth daily.    [provider]  Multiple Vitamin (MULTIVITAMIN WITH MINERALS) TABS tablet Take 1 tablet by mouth daily. Patient not taking: Reported on 11/09/2019    [provider]  nicotine (NICODERM CQ - DOSED IN MG/24 HOURS) 14 mg/24hr patch Place 1 patch (14 mg total) onto the skin daily. For 6 weeks.  Then decrease to 7 mg daily. 07/25/19   Yopp, Amber C, RPH-CPP  nicotine polacrilex (NICORETTE) 4 MG gum Take 1 each (4 mg total) by mouth as needed for smoking cessation. 07/25/19   Yopp, Amber C, RPH-CPP  tamsulosin (FLOMAX) 0.4 MG CAPS capsule Take 0.4 mg by mouth at bedtime. 10/10/19   [provider]    Allergies    Chantix [varenicline]  Review of Systems   Review of Systems  Constitutional: Negative for fever.  Respiratory: Positive for cough. Negative for shortness of breath.   Gastrointestinal: Negative for abdominal pain.  All other systems reviewed and are negative.   Physical Exam Updated Vital Signs BP (!) 108/94 (BP Location: Right Arm)   Pulse (!) 102   Temp 98.4 F (36.9 C) (Oral)   Resp 18   Ht 1.651 m (5\' 5" )   Wt 49 kg   SpO2 95%   BMI 17.97 kg/m   Physical Exam Vitals and nursing note reviewed.  Constitutional:      General: He is not in acute distress.     Appearance: He is well-developed.  HENT:     Head: Normocephalic and atraumatic.     Right Ear: External ear normal.     Left Ear: External ear normal.  Eyes:     General: No scleral icterus.       Right eye: No discharge.        Left eye: No discharge.     Conjunctiva/sclera: Conjunctivae normal.  Neck:     Trachea: No tracheal deviation.  Cardiovascular:     Rate and Rhythm: Normal rate and regular rhythm.  Pulmonary:     Effort: Pulmonary effort is  normal. No respiratory distress.     Breath sounds: No stridor. Rales present. No wheezing.     Comments: Crackles noted on lung exam Abdominal:     General: Bowel sounds are normal. There is no distension.     Palpations: Abdomen is soft.     Tenderness: There is no abdominal tenderness. There is no guarding or rebound.  Genitourinary:    Comments: Foley catheter in place, amber-red appearing urine, clot noted within the catheter bag Musculoskeletal:        General: No tenderness.     Cervical back: Neck supple.  Skin:    General: Skin is warm and dry.     Findings: No rash.  Neurological:     Mental Status: He is alert.     Cranial Nerves: No cranial nerve deficit (no facial droop, extraocular movements intact, no slurred speech).     Sensory: No sensory deficit.     Motor: No abnormal muscle tone or seizure activity.     Coordination: Coordination normal.     ED Results / Procedures / Treatments   Labs (all labs ordered are listed, but only abnormal results are displayed) Labs Reviewed  CBC - Abnormal; Notable for the following components:      Result Value   Hemoglobin 11.6 (*)    HCT 35.7 (*)    Platelets 440 (*)    All other components within normal limits  BASIC METABOLIC PANEL - Abnormal; Notable for the following components:   Glucose, Bld 112 (*)    Calcium 8.7 (*)    All other components within normal limits    EKG None  Radiology DG Chest 2 View  Result Date: 11/09/2019 CLINICAL DATA:  Follow-up  pneumonia EXAM: CHEST - 2 VIEW COMPARISON:  10/28/2019, 07/06/2015 CT 10/28/2019 FINDINGS: Resolved left suprahilar airspace disease. Mild residual streaky right infrahilar opacity. Paramediastinal distortion and elevation of the hila presumably due to post treatment change is unchanged. Normal heart size. No pneumothorax. Symmetrical small nodular opacities at the lung bases probably represent nipple shadow. IMPRESSION: Resolved left suprahilar airspace disease. Mild residual streaky right infrahilar opacity, may reflect mild residual infiltrate. Presumed paramediastinal post treatment changes are otherwise unchanged. Electronically Signed   By: Donavan Foil M.D.   On: 11/09/2019 15:23    Procedures Procedures (including critical care time)  Medications Ordered in ED Medications - No data to display  ED Course  I have reviewed the triage vital signs and the nursing notes.  Pertinent labs & imaging results that were available during my care of the patient were reviewed by me and considered in my medical decision making (see chart for details).  Clinical Course as of Nov 09 2238  Fri Nov 09, 2019  2235 Bladder scan shows 69 ml   [JK]  2235 Labs without significant abnormalities   [JK]    Clinical Course User Index [JK] Dorie Rank, MD   MDM Rules/Calculators/A&P                          Pt presented to the ED with complaints of foley cathter problem.  In the ED catheter seems to be draining properly.  Pt does have some evidence of hematuria and small clots but no obstruction now.  Pt requested cough medicine for recent diagnosed pna.  Foley catheter irriagated by rn staff Final Clinical Impression(s) / ED Diagnoses Final diagnoses:  Hematuria, unspecified type    Rx / DC Orders  ED Discharge Orders         Ordered    benzonatate (TESSALON) 100 MG capsule  Every 8 hours        11/09/19 2240           Dorie Rank, MD 11/09/19 2242

## 2019-11-09 NOTE — Patient Instructions (Addendum)
Recommendations: - Change to Mucinex - take two 600mg  tablets twice daily  - Continue Bevespi two puffs twice daily  - Start pulmicort nebulizer twice daily - Strart ipratropium-albuterol nebulizer every 6 hours for shortness of breath/wheezing  - Continue to use flutter valve three times a day (use after nebulizer)  Orders: - STAT CXR today re: pneumonia - Needs new nebulizer machine re: COPD  Rx: - Ipratropium-albuterol q6 hours for shortness of breath/wheezing  - Pulmicort twice daily   Follow-up: - 1-2 weeks with Dr. Elsworth Soho; if not available [facility]

## 2019-11-09 NOTE — Discharge Instructions (Signed)
Follow up with your urologist as planned.  The catheter seems to be draining properly at this time.

## 2019-11-09 NOTE — Telephone Encounter (Signed)
Noted. Can be addressed during OV this afternoon.

## 2019-11-09 NOTE — ED Notes (Signed)
Patient's foley flushed per ED order. Foley flushed with NS with urine return.

## 2019-11-09 NOTE — Progress Notes (Signed)
@Patient  ID: Evan [name], male    DOB: [birth year], 69 y.o.   MRN: [id number]  Chief Complaint  Patient presents with  . Hospitalization Follow-up    d/c 9/29, pneumonia both lungs. still has a cough, sometimes prod. wheezing and gurgling in the chest. sob on exertion.    Referring provider: No ref. provider found  HPI: 69 year old male, former smoker quit in March 2021 (22-pack-year history).  Past medical history significant for COPD, stage IIIa non-small cell lung cancer status post chemo and radiation completed in 2017.  Patient also underwent SBRT right middle lobe lung node (completed 07/24/2018) as well as SBRT to left lower lobe nodule (completed on April 27, 2019).  Patient of Dr. Elsworth Soho, last seen by pulmonary nurse practitioner on 06/04/2019.  Recent hospitalization in September for pneumonia.  Hospital course 10/28/2019-10/31/2019 Patient presented to ED with reports of fevers, chills, generalized weakness and malaise.  In ED patient was found to be tachypneic, tachycardic and hypoxemic.  CTA showed stable airspace disease in posterior left upper lobe and right lung base likely representing superimposed pneumonia.  Moderately increased prominence of paraesophageal and paraaortic lymph nodes likely representing metastatic disease.  CT abdomen demonstrated retroperitoneal periaortic LAD which represents metastatic disease, enlarged prostate.  Patient was treated with IV Rocephin, azithromycin and Solu-Medrol.  He was discharged home on prednisone, amoxicillin and doxycycline.  Receiving home health PT.  11/09/2019 Patient presents today for hospital follow-up.  Since hospital admission 10/28/19-10/31/19 he has been seen twice in the ED on 11/07/2019 for urinary retention and again on 11/09/2019 for hematuria. He saw Dr. 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 Patient is currently under observation.  Repeat CT scan head, chest and abdomen pelvis reviewed by  Dr. Earlie Server which showed no concerning findings for disease progression.  Recommend continue observation with repeat CT scan of chest in 6 months.  He continues to experience shortness of breath with cough on exertion. Cough is congested but non-productive. Complaint with Bevespi two puffs twice daily and had been using albuterol rescue inhaler 2-3 times a day. He has flutter valve which he has been using this three times a day. He is also taking 29ml mucinex every 4 hours.   Allergies  Allergen Reactions  . Chantix [Varenicline] Other (See Comments)    Weird dreams    Immunization History  Administered Date(s) Administered  . Influenza Whole 03/25/2009  . Influenza,inj,Quad PF,6+ Mos 12/16/2014, 10/02/2015, 10/31/2019  . PFIZER SARS-COV-2 Vaccination 04/30/2019, 05/23/2019  . Pneumococcal Polysaccharide-23 03/25/2009, 06/04/2019  . Tdap 05/21/2012    Past Medical History:  Diagnosis Date  . Anxiety   . Complication of anesthesia    " sometimes I wake up during surgery "  . Depression   . Left renal mass 07/19/2016  . Lung cancer ([place]) dx'd 2010    NSCL CA right  . Lung cancer ([place]) dx'd 2017   left    Tobacco History: Social History   Tobacco Use  Smoking Status Former Smoker  . Packs/day: 0.50  . Years: 45.00  . Pack years: 22.50  . Types: Cigarettes  . Quit date: 04/02/2019  . Years since quitting: 0.6  Smokeless Tobacco Never Used   Counseling given: Not Answered   Outpatient Medications Prior to Visit  Medication Sig Dispense Refill  . acetaminophen (TYLENOL) 500 MG tablet Take 500 mg by mouth every 6 (six) hours as needed for mild pain, fever or headache.     [name]  albuterol (VENTOLIN HFA) 108 (90 Base) MCG/ACT inhaler TAKE 2 PUFFS BY MOUTH EVERY 6 HOURS AS NEEDED FOR WHEEZE OR SHORTNESS OF BREATH (Patient taking differently: Inhale 2 puffs into the lungs every 6 (six) hours as needed for wheezing or shortness of breath. ) 18 g 3  . amoxicillin-clavulanate  (AUGMENTIN) 875-125 MG tablet Take 1 tablet by mouth 2 (two) times daily.    . Glycopyrrolate-Formoterol (BEVESPI AEROSPHERE) 9-4.8 MCG/ACT AERO Inhale 2 puffs into the lungs 2 (two) times daily. 10.7 g 11  . lactose free nutrition (BOOST) LIQD Take 237 mLs by mouth daily.    . nicotine (NICODERM CQ - DOSED IN MG/24 HOURS) 14 mg/24hr patch Place 1 patch (14 mg total) onto the skin daily. For 6 weeks.  Then decrease to 7 mg daily. 28 patch 1  . nicotine polacrilex (NICORETTE) 4 MG gum Take 1 each (4 mg total) by mouth as needed for smoking cessation. 100 tablet 0  . tamsulosin (FLOMAX) 0.4 MG CAPS capsule Take 0.4 mg by mouth at bedtime.    . [employer] ASPIRIN EC 325 MG EC tablet TAKE 1 TABLET BY MOUTH EVERY DAY (Patient not taking: Reported on 11/08/2019) 90 tablet 1  . Multiple Vitamin (MULTIVITAMIN WITH MINERALS) TABS tablet Take 1 tablet by mouth daily. (Patient not taking: Reported on 11/09/2019)     No facility-administered medications prior to visit.    Review of Systems  Review of Systems  Constitutional: Positive for fatigue.  Respiratory: Positive for cough and shortness of breath.   Cardiovascular: Negative.    Physical Exam  BP 98/68 (BP Location: Right Arm, Patient Position: Sitting, Cuff Size: Normal)   Pulse (!) 111   Temp 97.6 F (36.4 C) (Temporal)   Ht 5\' 5"  (1.651 m)   Wt 108 lb (49 kg)   SpO2 97%   BMI 17.97 kg/m  Physical Exam Constitutional:      Appearance: Normal appearance. He is ill-appearing.     Comments: Thin adult male; chronically ill appearing  Cardiovascular:     Rate and Rhythm: Regular rhythm. Tachycardia present.  Pulmonary:     Breath sounds: Wheezing and rhonchi present.     Comments: Coarse rhonchi t/o, worse on right  Neurological:     General: No focal deficit present.     Mental Status: He is alert and oriented to person, place, and time. Mental status is at baseline.  Psychiatric:        Mood and Affect: Mood normal.        Behavior:  Behavior normal.        Thought Content: Thought content normal.        Judgment: Judgment normal.      Lab Results:  CBC    Component Value Date/Time   WBC 8.0 11/09/2019 2136   RBC 4.23 11/09/2019 2136   HGB 11.6 (L) 11/09/2019 2136   HGB 14.8 08/03/2019 1135   HGB 14.6 07/16/2016 1220   HCT 35.7 (L) 11/09/2019 2136   HCT 44.7 07/16/2016 1220   PLT 440 (H) 11/09/2019 2136   PLT 413 (H) 08/03/2019 1135   PLT 367 07/16/2016 1220   MCV 84.4 11/09/2019 2136   MCV 89.0 07/16/2016 1220   MCH 27.4 11/09/2019 2136   MCHC 32.5 11/09/2019 2136   RDW 14.8 11/09/2019 2136   RDW 14.3 07/16/2016 1220   LYMPHSABS 0.8 10/29/2019 0401   LYMPHSABS 1.0 07/16/2016 1220   MONOABS 0.6 10/29/2019 0401   MONOABS 0.3 07/16/2016 1220  EOSABS 0.5 10/29/2019 0401   EOSABS 0.4 07/16/2016 1220   BASOSABS 0.1 10/29/2019 0401   BASOSABS 0.1 07/16/2016 1220    BMET    Component Value Date/Time   NA 135 11/09/2019 2136   NA 141 07/16/2016 1220   K 4.2 11/09/2019 2136   K 4.0 07/16/2016 1220   CL 102 11/09/2019 2136   CL 104 07/19/2012 1432   CO2 24 11/09/2019 2136   CO2 24 07/16/2016 1220   GLUCOSE 112 (H) 11/09/2019 2136   GLUCOSE 89 07/16/2016 1220   GLUCOSE 97 07/19/2012 1432   BUN 22 11/09/2019 2136   BUN 8.8 07/16/2016 1220   CREATININE 0.76 11/09/2019 2136   CREATININE 1.06 08/03/2019 1135   CREATININE 1.0 07/16/2016 1220   CALCIUM 8.7 (L) 11/09/2019 2136   CALCIUM 9.3 07/16/2016 1220   GFRNONAA >60 11/09/2019 2136   GFRNONAA >60 08/03/2019 1135   GFRAA >60 10/31/2019 0523   GFRAA >60 08/03/2019 1135    BNP No results found for: BNP  ProBNP No results found for: PROBNP  Imaging: DG Chest 2 View  Result Date: 11/09/2019 CLINICAL DATA:  Follow-up pneumonia EXAM: CHEST - 2 VIEW COMPARISON:  10/28/2019, 07/06/2015 CT 10/28/2019 FINDINGS: Resolved left suprahilar airspace disease. Mild residual streaky right infrahilar opacity. Paramediastinal distortion and elevation of  the hila presumably due to post treatment change is unchanged. Normal heart size. No pneumothorax. Symmetrical small nodular opacities at the lung bases probably represent nipple shadow. IMPRESSION: Resolved left suprahilar airspace disease. Mild residual streaky right infrahilar opacity, may reflect mild residual infiltrate. Presumed paramediastinal post treatment changes are otherwise unchanged. Electronically Signed   By: Donavan Foil M.D.   On: 11/09/2019 15:23   CT Head Wo Contrast  Result Date: 10/28/2019 CLINICAL DATA:  Non-small cell lung cancer now with dizziness and balance difficulties. Headache. EXAM: CT HEAD WITHOUT CONTRAST TECHNIQUE: Contiguous axial images were obtained from the base of the skull through the vertex without intravenous contrast. COMPARISON:  MRI brain 08/29/2018 FINDINGS: Brain: No mass effect or midline shift. No abnormal extra-axial fluid collections. Gray-white matter junctions are distinct. Basal cisterns are not effaced. No acute intracranial hemorrhage. No specific evidence of an intracranial mass although contrast-enhanced MRI would be the most sensitive modality for detection of intracranial metastatic disease. Vascular: No hyperdense vessel or unexpected calcification. Skull: Calvarium appears intact.  No focal lesions identified. Sinuses/Orbits: Paranasal sinuses and mastoid air cells are clear. Other: None. IMPRESSION: No acute intracranial abnormalities. No specific evidence of an intracranial mass although contrast-enhanced MRI would be the most sensitive modality for detection of intracranial metastatic disease. Electronically Signed   By: Lucienne Capers M.D.   On: 10/28/2019 22:28   CT Angio Chest PE W and/or Wo Contrast  Result Date: 10/28/2019 CLINICAL DATA:  New onset chest pain and epigastric pain. History of lung cancer. EXAM: CT ANGIOGRAPHY CHEST CT ABDOMEN AND PELVIS WITH CONTRAST TECHNIQUE: Multidetector CT imaging of the chest was performed using the  standard protocol during bolus administration of intravenous contrast. Multiplanar CT image reconstructions and MIPs were obtained to evaluate the vascular anatomy. Multidetector CT imaging of the abdomen and pelvis was performed using the standard protocol during bolus administration of intravenous contrast. CONTRAST:  158mL OMNIPAQUE IOHEXOL 350 MG/ML SOLN COMPARISON:  CT chest 08/03/2019.  PET-CT 03/30/2019 FINDINGS: CTA CHEST FINDINGS Cardiovascular: Good opacification of the central and segmental pulmonary arteries. No focal filling defects. No evidence of significant pulmonary embolus. Normal heart size. No pericardial effusions. Normal caliber  thoracic aorta. Great vessels are patent. Mild aortic calcification. 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 Mediastinum/Nodes: Right hilar soft tissue or lymphadenopathy measuring 1.7 cm diameter. Consolidation and volume loss along the medial aspect of the mediastinum bilaterally. Left anterior paramediastinal soft tissue. This is likely post radiation change. Similar appearance to previous study. Esophagus is decompressed. Moderate prominence of paraesophageal and para-aortic lymph nodes at the crus of the diaphragm, measuring up to about 1 cm short axis dimension but more prominent than on prior study. Possible metastatic disease. Lungs/Pleura: Scarring in the medial lungs is likely post radiation change. Bronchiectasis and bronchial wall thickening. New areas of airspace disease in the posterior left upper lung and in the right lung base possibly representing superimposed pneumonia. No pleural effusions. No pneumothorax. Musculoskeletal: No destructive bone lesions. Review of the MIP images confirms the above findings. CT ABDOMEN and PELVIS FINDINGS Hepatobiliary: No focal liver abnormality is seen. No gallstones, gallbladder wall thickening, or biliary dilatation. Pancreas: Unremarkable. No pancreatic ductal  dilatation or surrounding inflammatory changes. Spleen: Normal in size without focal abnormality. Adrenals/Urinary Tract: Adrenal glands are unremarkable. Kidneys are normal, without renal calculi, focal lesion, or hydronephrosis. Bladder is unremarkable. Stomach/Bowel: Stomach, small bowel, and colon are mostly decompressed with scattered stool in the colon. No inflammatory changes are appreciated. The appendix is normal. Vascular/Lymphatic: Calcification of the aorta. No aneurysm. Moderately prominent retroperitoneal para-aortic lymphadenopathy with individual lymph nodes measuring up to about 9 mm short axis dimension. This is developing since previous study and may represent metastatic disease. Reproductive: Marked enlargement of the prostate gland, measuring 7 cm in diameter. Other: No free air or free fluid in the abdomen. Abdominal wall musculature appears intact. Musculoskeletal: No destructive bone lesions. Review of the MIP images confirms the above findings. IMPRESSION: 1. No evidence of significant pulmonary embolus. 2. Consolidation and volume loss along the medial aspect of the mediastinum bilaterally, likely post radiation change. Similar appearance to previous study. 3. New areas of airspace disease in the posterior left upper lung and in the right lung base possibly representing superimposed pneumonia. 4. Right hilar soft tissue or lymphadenopathy measuring 1.7 cm diameter, unchanged. 5. Moderate prominence of paraesophageal and para-aortic lymph nodes at the crus of the diaphragm, more prominent than on prior study. Possible metastatic disease. 6. Moderately prominent retroperitoneal para-aortic lymph nodes measuring up to about 9 mm short axis dimension, developing since previous study and may represent metastatic disease. 7. Marked enlargement of the prostate gland. 8. 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 9. Aortic atherosclerosis. Aortic  Atherosclerosis (ICD10-I70.0). Electronically Signed   By: Lucienne Capers M.D.   On: 10/28/2019 22:22   CT ABDOMEN PELVIS W CONTRAST  Result Date: 10/28/2019 CLINICAL DATA:  New onset chest pain and epigastric pain. History of lung cancer. EXAM: CT ANGIOGRAPHY CHEST CT ABDOMEN AND PELVIS WITH CONTRAST TECHNIQUE: Multidetector CT imaging of the chest was performed using the standard protocol during bolus administration of intravenous contrast. Multiplanar CT image reconstructions and MIPs were obtained to evaluate the vascular anatomy. Multidetector CT imaging of the abdomen and pelvis was performed using the standard protocol during bolus administration of intravenous contrast. CONTRAST:  166mL OMNIPAQUE IOHEXOL 350 MG/ML SOLN COMPARISON:  CT chest 08/03/2019.  PET-CT 03/30/2019 FINDINGS: CTA CHEST FINDINGS Cardiovascular: Good opacification of the central and segmental pulmonary arteries. No focal filling defects. No evidence of significant pulmonary embolus. Normal heart size. No pericardial  effusions. Normal caliber thoracic aorta. Great vessels are patent. Mild aortic calcification. 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 Mediastinum/Nodes: Right hilar soft tissue or lymphadenopathy measuring 1.7 cm diameter. Consolidation and volume loss along the medial aspect of the mediastinum bilaterally. Left anterior paramediastinal soft tissue. This is likely post radiation change. Similar appearance to previous study. Esophagus is decompressed. Moderate prominence of paraesophageal and para-aortic lymph nodes at the crus of the diaphragm, measuring up to about 1 cm short axis dimension but more prominent than on prior study. Possible metastatic disease. Lungs/Pleura: Scarring in the medial lungs is likely post radiation change. Bronchiectasis and bronchial wall thickening. New areas of airspace disease in the posterior left upper lung and in the right lung base  possibly representing superimposed pneumonia. No pleural effusions. No pneumothorax. Musculoskeletal: No destructive bone lesions. Review of the MIP images confirms the above findings. CT ABDOMEN and PELVIS FINDINGS Hepatobiliary: No focal liver abnormality is seen. No gallstones, gallbladder wall thickening, or biliary dilatation. Pancreas: Unremarkable. No pancreatic ductal dilatation or surrounding inflammatory changes. Spleen: Normal in size without focal abnormality. Adrenals/Urinary Tract: Adrenal glands are unremarkable. Kidneys are normal, without renal calculi, focal lesion, or hydronephrosis. Bladder is unremarkable. Stomach/Bowel: Stomach, small bowel, and colon are mostly decompressed with scattered stool in the colon. No inflammatory changes are appreciated. The appendix is normal. Vascular/Lymphatic: Calcification of the aorta. No aneurysm. Moderately prominent retroperitoneal para-aortic lymphadenopathy with individual lymph nodes measuring up to about 9 mm short axis dimension. This is developing since previous study and may represent metastatic disease. Reproductive: Marked enlargement of the prostate gland, measuring 7 cm in diameter. Other: No free air or free fluid in the abdomen. Abdominal wall musculature appears intact. Musculoskeletal: No destructive bone lesions. Review of the MIP images confirms the above findings. IMPRESSION: 1. No evidence of significant pulmonary embolus. 2. Consolidation and volume loss along the medial aspect of the mediastinum bilaterally, likely post radiation change. Similar appearance to previous study. 3. New areas of airspace disease in the posterior left upper lung and in the right lung base possibly representing superimposed pneumonia. 4. Right hilar soft tissue or lymphadenopathy measuring 1.7 cm diameter, unchanged. 5. Moderate prominence of paraesophageal and para-aortic lymph nodes at the crus of the diaphragm, more prominent than on prior study. Possible  metastatic disease. 6. Moderately prominent retroperitoneal para-aortic lymph nodes measuring up to about 9 mm short axis dimension, developing since previous study and may represent metastatic disease. 7. Marked enlargement of the prostate gland. 8. 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 9. Aortic atherosclerosis. Aortic Atherosclerosis (ICD10-I70.0). Electronically Signed   By: Lucienne Capers M.D.   On: 10/28/2019 22:22   DG Chest Portable 1 View  Result Date: 10/28/2019 CLINICAL DATA:  Shortness of breath.  History of lung cancer. EXAM: PORTABLE CHEST 1 VIEW COMPARISON:  CT chest dated July 04, 2019 FINDINGS: Persistent medial bilateral upper lung zone airspace opacification is noted which is likely related to prior treatment. There are parent new opacities in the left upper lung zone and right infrahilar region. The heart size appears stable. Aortic calcifications are noted. There is no pneumothorax. There is stable elevation of the left hemidiaphragm. No definite acute osseous abnormality. IMPRESSION: 1. Suggestion of new airspace opacities in the right infrahilar region and left upper lobe which may indicate developing infiltrates in the appropriate clinical setting. 2. Persistent post treatment changes in the bilateral upper  lung zones. 3. Stable elevation of the left hemidiaphragm. 4. No convincing large pleural effusion. Electronically Signed   By: Constance Holster M.D.   On: 10/28/2019 18:10     Assessment & Plan:   Pneumonia Treated for bilaterally PNA in September with IV rocephin/azithromycin and oral amoxicillin/doxycycline. He continues to have significantly congested cough and coarse rhonchi t/o lungs. STAT CXR today showed resolved left airspace disease with mild residual right infrahilar opacity which may residual infiltrate. D/t clinical presentation and imaging recommend additional course of abx. He was sent in RX Augmentin from  outside provider on 11/09/19, recommend he take as prescribed.  Plan: - Change to Mucinex TABLET- take two 600mg  tablets (1,200MG ) twice daily  - Continue Bevespi two puffs twice daily  - Start pulmicort nebulizer twice daily - Start ipratropium-albuterol nebulizer every 6 hours for shortness of breath/wheezing  - Continue to use flutter valve three times a day (use after nebulizer)  Orders: - STAT CXR today re: pneumonia - Needs new nebulizer machine re: COPD  Rx: - Ipratropium-albuterol q6 hours for shortness of breath/wheezing  - Pulmicort twice daily   Follow-up: - 1-2 weeks with Dr. Elsworth Soho; if not available Maryclare Bean, NP 11/13/2019

## 2019-11-09 NOTE — Assessment & Plan Note (Addendum)
Treated for bilaterally PNA in September with IV rocephin/azithromycin and oral amoxicillin/doxycycline. He continues to have significantly congested cough and coarse rhonchi t/o lungs. STAT CXR today showed resolved left airspace disease with mild residual right infrahilar opacity which may residual infiltrate. D/t clinical presentation and imaging recommend additional course of abx. He was sent in RX Augmentin from outside provider on 11/09/19, recommend he take as prescribed.  Plan: - Change to Mucinex TABLET- take two 600mg  tablets (1,200MG ) twice daily  - Continue Bevespi two puffs twice daily  - Start pulmicort nebulizer twice daily - Start ipratropium-albuterol nebulizer every 6 hours for shortness of breath/wheezing  - Continue to use flutter valve three times a day (use after nebulizer)  Orders: - STAT CXR today re: pneumonia - Needs new nebulizer machine re: COPD  Rx: - Ipratropium-albuterol q6 hours for shortness of breath/wheezing  - Pulmicort twice daily   Follow-up: - 1-2 weeks with Dr. Elsworth Soho; if not available [facility]

## 2019-11-12 ENCOUNTER — Ambulatory Visit: Payer: Medicare PPO | Admitting: Internal Medicine

## 2019-11-14 ENCOUNTER — Telehealth: Payer: Self-pay | Admitting: Pulmonary Disease

## 2019-11-14 NOTE — Telephone Encounter (Signed)
Spoke with the pt and his son and notified of response per [facility] and they verbalized understanding

## 2019-11-14 NOTE — Telephone Encounter (Signed)
Called and spoke with son who states patient was seen on Friday and given antibiotics and had CXR. He is not feeling better struggling to breath, not really getting up to move around, patient breathing hard, sputum varies between clear and green, denies fevers.   Patient was supposed to come in on Friday son requested that it be moved up he has been rescheduled for tomorrow at [place] please advise

## 2019-11-14 NOTE — Telephone Encounter (Signed)
Ok, I think he is borderline needing to go back to the hospital. If he declines at all today he needs to present back to ED for respiratory complaints (not hematuria that he has been seen for previously)

## 2019-11-15 ENCOUNTER — Ambulatory Visit (INDEPENDENT_AMBULATORY_CARE_PROVIDER_SITE_OTHER): Payer: Medicare PPO | Admitting: Primary Care

## 2019-11-15 ENCOUNTER — Inpatient Hospital Stay (HOSPITAL_COMMUNITY)
Admission: AD | Admit: 2019-11-15 | Discharge: 2019-11-20 | DRG: 190 | Disposition: A | Discharge status: Home / Self Care | Payer: Medicare PPO | Source: Ambulatory Visit | Attending: Pulmonary Disease | Admitting: Pulmonary Disease

## 2019-11-15 ENCOUNTER — Encounter (HOSPITAL_COMMUNITY): Payer: Self-pay | Admitting: Pulmonary Disease

## 2019-11-15 ENCOUNTER — Inpatient Hospital Stay (HOSPITAL_COMMUNITY): Payer: Medicare PPO

## 2019-11-15 ENCOUNTER — Encounter: Payer: Self-pay | Admitting: Primary Care

## 2019-11-15 ENCOUNTER — Other Ambulatory Visit: Payer: Self-pay

## 2019-11-15 ENCOUNTER — Ambulatory Visit: Payer: Medicare PPO | Admitting: Internal Medicine

## 2019-11-15 DIAGNOSIS — R627 Adult failure to thrive: Secondary | ICD-10-CM | POA: Diagnosis present

## 2019-11-15 DIAGNOSIS — Z923 Personal history of irradiation: Secondary | ICD-10-CM | POA: Diagnosis not present

## 2019-11-15 DIAGNOSIS — Z87891 Personal history of nicotine dependence: Secondary | ICD-10-CM | POA: Diagnosis not present

## 2019-11-15 DIAGNOSIS — J441 Chronic obstructive pulmonary disease with (acute) exacerbation: Principal | ICD-10-CM | POA: Diagnosis present

## 2019-11-15 DIAGNOSIS — Z888 Allergy status to other drugs, medicaments and biological substances status: Secondary | ICD-10-CM | POA: Diagnosis not present

## 2019-11-15 DIAGNOSIS — Z89511 Acquired absence of right leg below knee: Secondary | ICD-10-CM

## 2019-11-15 DIAGNOSIS — Z85118 Personal history of other malignant neoplasm of bronchus and lung: Secondary | ICD-10-CM

## 2019-11-15 DIAGNOSIS — E43 Unspecified severe protein-calorie malnutrition: Secondary | ICD-10-CM | POA: Insufficient documentation

## 2019-11-15 DIAGNOSIS — Z7951 Long term (current) use of inhaled steroids: Secondary | ICD-10-CM | POA: Diagnosis not present

## 2019-11-15 DIAGNOSIS — R0603 Acute respiratory distress: Secondary | ICD-10-CM

## 2019-11-15 DIAGNOSIS — F32A Depression, unspecified: Secondary | ICD-10-CM | POA: Diagnosis present

## 2019-11-15 DIAGNOSIS — Z8701 Personal history of pneumonia (recurrent): Secondary | ICD-10-CM

## 2019-11-15 DIAGNOSIS — J410 Simple chronic bronchitis: Secondary | ICD-10-CM

## 2019-11-15 DIAGNOSIS — Z79899 Other long term (current) drug therapy: Secondary | ICD-10-CM

## 2019-11-15 DIAGNOSIS — Z7982 Long term (current) use of aspirin: Secondary | ICD-10-CM

## 2019-11-15 DIAGNOSIS — J9621 Acute and chronic respiratory failure with hypoxia: Secondary | ICD-10-CM | POA: Diagnosis present

## 2019-11-15 DIAGNOSIS — F419 Anxiety disorder, unspecified: Secondary | ICD-10-CM | POA: Diagnosis present

## 2019-11-15 DIAGNOSIS — Z681 Body mass index (BMI) 19 or less, adult: Secondary | ICD-10-CM | POA: Diagnosis not present

## 2019-11-15 DIAGNOSIS — R0600 Dyspnea, unspecified: Secondary | ICD-10-CM

## 2019-11-15 DIAGNOSIS — N401 Enlarged prostate with lower urinary tract symptoms: Secondary | ICD-10-CM | POA: Diagnosis present

## 2019-11-15 DIAGNOSIS — F17200 Nicotine dependence, unspecified, uncomplicated: Secondary | ICD-10-CM

## 2019-11-15 DIAGNOSIS — R338 Other retention of urine: Secondary | ICD-10-CM | POA: Diagnosis present

## 2019-11-15 DIAGNOSIS — Z9221 Personal history of antineoplastic chemotherapy: Secondary | ICD-10-CM

## 2019-11-15 DIAGNOSIS — R0602 Shortness of breath: Secondary | ICD-10-CM | POA: Diagnosis present

## 2019-11-15 DIAGNOSIS — R911 Solitary pulmonary nodule: Secondary | ICD-10-CM

## 2019-11-15 DIAGNOSIS — C3432 Malignant neoplasm of lower lobe, left bronchus or lung: Secondary | ICD-10-CM

## 2019-11-15 LAB — COMPREHENSIVE METABOLIC PANEL
ALT: 26 U/L (ref 0–44)
AST: 21 U/L (ref 15–41)
Albumin: 3.4 g/dL — ABNORMAL LOW (ref 3.5–5.0)
Alkaline Phosphatase: 86 U/L (ref 38–126)
Anion gap: 11 (ref 5–15)
BUN: 18 mg/dL (ref 8–23)
CO2: 26 mmol/L (ref 22–32)
Calcium: 9.2 mg/dL (ref 8.9–10.3)
Chloride: 101 mmol/L (ref 98–111)
Creatinine, Ser: 0.91 mg/dL (ref 0.61–1.24)
GFR, Estimated: >60 mL/min (ref >60)
Glucose, Bld: 92 mg/dL (ref 70–99)
Potassium: 4.5 mmol/L (ref 3.5–5.1)
Sodium: 138 mmol/L (ref 135–145)
Total Bilirubin: 0.4 mg/dL (ref 0.3–1.2)
Total Protein: 7.2 g/dL (ref 6.5–8.1)

## 2019-11-15 LAB — CBC WITH DIFFERENTIAL/PLATELET
Abs Immature Granulocytes: 0.01 10*3/uL (ref 0.00–0.07)
Basophils Absolute: 0 10*3/uL (ref 0.0–0.1)
Basophils Relative: 1 %
Eosinophils Absolute: 0.2 10*3/uL (ref 0.0–0.5)
Eosinophils Relative: 3 %
HCT: 37.6 % — ABNORMAL LOW (ref 39.0–52.0)
Hemoglobin: 11.9 g/dL — ABNORMAL LOW (ref 13.0–17.0)
Immature Granulocytes: 0 %
Lymphocytes Relative: 11 %
Lymphs Abs: 0.8 10*3/uL (ref 0.7–4.0)
MCH: 26.8 pg (ref 26.0–34.0)
MCHC: 31.6 g/dL (ref 30.0–36.0)
MCV: 84.7 fL (ref 80.0–100.0)
Monocytes Absolute: 0.8 10*3/uL (ref 0.1–1.0)
Monocytes Relative: 11 %
Neutro Abs: 5.3 10*3/uL (ref 1.7–7.7)
Neutrophils Relative %: 74 %
Platelets: 357 10*3/uL (ref 150–400)
RBC: 4.44 MIL/uL (ref 4.22–5.81)
RDW: 15 % (ref 11.5–15.5)
WBC: 7.1 10*3/uL (ref 4.0–10.5)
nRBC: 0 % (ref 0.0–0.2)

## 2019-11-15 LAB — BRAIN NATRIURETIC PEPTIDE: B Natriuretic Peptide: 36.8 pg/mL (ref 0.0–100.0)

## 2019-11-15 LAB — URINALYSIS, ROUTINE W REFLEX MICROSCOPIC
Bacteria, UA: NONE SEEN
Bilirubin Urine: NEGATIVE
Glucose, UA: NEGATIVE mg/dL
Ketones, ur: 5 mg/dL — AB
Nitrite: NEGATIVE
Protein, ur: 100 mg/dL — AB
RBC / HPF: >50 RBC/hpf — ABNORMAL HIGH (ref 0–5)
Specific Gravity, Urine: 1.026 (ref 1.005–1.030)
WBC, UA: >50 WBC/hpf — ABNORMAL HIGH (ref 0–5)
pH: 5 (ref 5.0–8.0)

## 2019-11-15 LAB — EXPECTORATED SPUTUM ASSESSMENT W GRAM STAIN, RFLX TO RESP C: Special Requests: NORMAL

## 2019-11-15 LAB — D-DIMER, QUANTITATIVE: D-Dimer, Quant: 15.14 ug/mL-FEU — ABNORMAL HIGH (ref 0.00–0.50)

## 2019-11-15 LAB — MRSA PCR SCREENING: MRSA by PCR: NEGATIVE

## 2019-11-15 LAB — PROCALCITONIN: Procalcitonin: <0.1 ng/mL

## 2019-11-15 MED ORDER — SODIUM CHLORIDE 0.9 % IV SOLN
2.0000 g | Freq: Three times a day (TID) | INTRAVENOUS | Status: DC
  Administered 2019-11-15 – 2019-11-16 (×3): 2 g via INTRAVENOUS
  Filled 2019-11-15 (×4): qty 2

## 2019-11-15 MED ORDER — HEPARIN SODIUM (PORCINE) 5000 UNIT/ML IJ SOLN
5000.0000 [IU] | Freq: Three times a day (TID) | INTRAMUSCULAR | Status: DC
  Administered 2019-11-15 – 2019-11-20 (×15): 5000 [IU] via SUBCUTANEOUS
  Filled 2019-11-15 (×15): qty 1

## 2019-11-15 MED ORDER — BUDESONIDE 0.5 MG/2ML IN SUSP
0.5000 mg | Freq: Two times a day (BID) | RESPIRATORY_TRACT | Status: DC
  Administered 2019-11-15 – 2019-11-20 (×10): 0.5 mg via RESPIRATORY_TRACT
  Filled 2019-11-15 (×10): qty 2

## 2019-11-15 MED ORDER — IPRATROPIUM-ALBUTEROL 0.5-2.5 (3) MG/3ML IN SOLN
3.0000 mL | RESPIRATORY_TRACT | Status: DC | PRN
  Administered 2019-11-16: 3 mL via RESPIRATORY_TRACT
  Filled 2019-11-15: qty 3

## 2019-11-15 MED ORDER — ENSURE ENLIVE PO LIQD
237.0000 mL | Freq: Two times a day (BID) | ORAL | Status: DC
  Administered 2019-11-15 – 2019-11-20 (×5): 237 mL via ORAL

## 2019-11-15 MED ORDER — ACETAMINOPHEN 325 MG PO TABS
650.0000 mg | ORAL_TABLET | ORAL | Status: DC | PRN
  Administered 2019-11-15 – 2019-11-16 (×3): 650 mg via ORAL
  Filled 2019-11-15 (×3): qty 2

## 2019-11-15 MED ORDER — ARFORMOTEROL TARTRATE 15 MCG/2ML IN NEBU
15.0000 ug | INHALATION_SOLUTION | Freq: Two times a day (BID) | RESPIRATORY_TRACT | Status: DC
  Administered 2019-11-15 – 2019-11-20 (×10): 15 ug via RESPIRATORY_TRACT
  Filled 2019-11-15 (×10): qty 2

## 2019-11-15 MED ORDER — DOCUSATE SODIUM 100 MG PO CAPS: 100.0000 mg | ORAL_CAPSULE | Freq: Two times a day (BID) | ORAL | Status: DC | PRN

## 2019-11-15 MED ORDER — REVEFENACIN 175 MCG/3ML IN SOLN
175.0000 ug | Freq: Every day | RESPIRATORY_TRACT | Status: DC
  Administered 2019-11-15 – 2019-11-20 (×6): 175 ug via RESPIRATORY_TRACT
  Filled 2019-11-15 (×6): qty 3

## 2019-11-15 MED ORDER — POLYETHYLENE GLYCOL 3350 17 G PO PACK: 17.0000 g | PACK | Freq: Every day | ORAL | Status: DC | PRN

## 2019-11-15 MED ORDER — TAMSULOSIN HCL 0.4 MG PO CAPS
0.4000 mg | ORAL_CAPSULE | Freq: Every day | ORAL | Status: DC
  Administered 2019-11-15 – 2019-11-19 (×5): 0.4 mg via ORAL
  Filled 2019-11-15 (×5): qty 1

## 2019-11-15 MED ORDER — GUAIFENESIN ER 600 MG PO TB12
1200.0000 mg | ORAL_TABLET | Freq: Two times a day (BID) | ORAL | Status: DC
  Administered 2019-11-15 – 2019-11-20 (×11): 1200 mg via ORAL
  Filled 2019-11-15 (×12): qty 2

## 2019-11-15 NOTE — Plan of Care (Signed)
  Problem: Education: Goal: Knowledge of General Education information will improve Description Including pain rating scale, medication(s)/side effects and non-pharmacologic comfort measures Outcome: Progressing   

## 2019-11-15 NOTE — Plan of Care (Signed)

## 2019-11-15 NOTE — Progress Notes (Signed)
Pt w/constant congested, cough, upper airway,towards the throat, with decreased breathe sounds.  Mucinexx administered as ordered and MD made aware.

## 2019-11-15 NOTE — Progress Notes (Signed)
Received pt from [place] office, son accompanying patient. Pt noted with SOB with mild exertion, O2 sat on RA 98%, pt note to have foley cath, states placed in Urology office for urinary retention reports he has had the foley for about a week with an exchanged on 10/13 at the Urology office--(18 French foley connected to leg bag, changed leg bag to drainage system. Noted to have a weak congested cough--thick clear to greenish sputum noted collected and sent to lab. Pt able to swallow med without issues. Tele placed and verified. Will continue to monitor. SRP, RN
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
## 2019-11-15 NOTE — Progress Notes (Signed)
Pharmacy Antibiotic Note  SOFIA JAQUITH is a 69 y.o. male admitted on 11/15/2019 with pneumonia.  Pharmacy has been consulted for cefepime dosing.  Plan: Cefepime 2g IV q8     Temp (24hrs), Avg:97.9 F (36.6 C), Min:97.7 F (36.5 C), Max:98.2 F (36.8 C)  Recent Labs  Lab 11/09/19 2136 11/15/19 1243  WBC 8.0 7.1  CREATININE 0.76  --     Estimated Creatinine Clearance: 60.8 mL/min (by C-G formula based on SCr of 0.76 mg/dL).    Allergies  Allergen Reactions  . Chantix [Varenicline] Other (See Comments)    Weird dreams    Thank you for allowing pharmacy to be a part of this patient's care.  Kara Mead 11/15/2019 1:11 PM

## 2019-11-15 NOTE — Progress Notes (Addendum)
@Patient  ID: Evan [name], male    DOB: 08/30/1950, 69 y.o.   MRN: [id number]  Chief Complaint  Patient presents with  . Follow-up    Pt has become worse since last visit. Pt has had a lot of congestion, wheezing, and is coughing a lot getting clear phlegm up. Pt's breathing has also become worse    Referring provider: No ref. provider found  HPI: 69 year old male, former smoker quit in March 2021 (22-pack-year history).  Past medical history significant for COPD, stage IIIa non-small cell lung cancer status post chemo and radiation completed in 2017.  Patient also underwent SBRT right middle lobe lung node (completed 07/24/2018) as well as SBRT to left lower lobe nodule (completed on April 27, 2019).  Patient of Dr. Elsworth Soho, last seen by pulmonary nurse practitioner on 06/04/2019.  Recent hospitalization in September for pneumonia.  Hospital course 10/28/2019-10/31/2019 Patient presented to ED with reports of fevers, chills, generalized weakness and malaise.  In ED patient was found to be tachypneic, tachycardic and hypoxemic.  CTA showed stable airspace disease in posterior left upper lobe and right lung base likely representing superimposed pneumonia.  Moderately increased prominence of paraesophageal and paraaortic lymph nodes likely representing metastatic disease.  CT abdomen demonstrated retroperitoneal periaortic LAD which represents metastatic disease, enlarged prostate.  Patient was treated with IV Rocephin, azithromycin and Solu-Medrol.  He was discharged home on prednisone, amoxicillin and doxycycline. Receiving home health PT.  11/09/2019 Patient presents today for hospital follow-up.  Since hospital admission 10/28/19-10/31/19 he has been seen twice in the ED on 11/07/2019 for urinary retention and again on 11/09/2019 for hematuria. He saw Dr. 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 Patient is currently under observation.  Repeat CT scan head, chest and  abdomen pelvis reviewed by Dr. Earlie Server which showed no concerning findings for disease progression.  Recommend continue observation with repeat CT scan of chest in 6 months.  He continues to experience shortness of breath with cough on exertion. Cough is congested but non-productive. Complaint with Bevespi two puffs twice daily and had been using albuterol rescue inhaler 2-3 times a day. He has flutter valve which he has been using this three times a day. He is also taking 38ml mucinex every 4 hours.   11/15/2019- Interim hx Patient presents today for 1 week follow-up. He is not doing any better. Since last visit he has gone to the ED twice. Once for hematuria on 11/09/19 and yesterday [facility] ED for shortness of breath. CXR showed no acute cardiopulmonary process, widening right paratracheal stripe. Patient was not admitted, there was a 16 hour wait in ED.  Patient is not doing any better since last visit. He finished doxycyline course yesterday. He continues to have an extremely congested cough with clear mucus production. Associated dyspnea, wheezing and weakness. He has been taking double strength mucinex and using flutter valve with no improvement. He has a extremly congested cough. He has not received nebulizer machine from DME company. Dr. Elsworth Soho in room to speak with family, recommending admission for IV abx and bronchodilators.   Allergies  Allergen Reactions  . Chantix [Varenicline] Other (See Comments)    Weird dreams    Immunization History  Administered Date(s) Administered  . Influenza Whole 03/25/2009  . Influenza,inj,Quad PF,6+ Mos 12/16/2014, 10/02/2015, 10/31/2019  . PFIZER SARS-COV-2 Vaccination 04/30/2019, 05/23/2019  . Pneumococcal Polysaccharide-23 03/25/2009, 06/04/2019  . Tdap 05/21/2012    Past Medical History:  Diagnosis Date  .  Anxiety   . Complication of anesthesia    " sometimes I wake up during surgery "  . Depression   . Left renal mass 07/19/2016  .  Lung cancer ([place]) dx'd 2010    NSCL CA right  . Lung cancer ([place]) dx'd 2017   left    Tobacco History: Social History   Tobacco Use  Smoking Status Former Smoker  . Packs/day: 0.50  . Years: 45.00  . Pack years: 22.50  . Types: Cigarettes  . Quit date: 04/02/2019  . Years since quitting: 0.6  Smokeless Tobacco Never Used   Counseling given: Not Answered   No facility-administered medications prior to visit.   Outpatient Medications Prior to Visit  Medication Sig Dispense Refill  . acetaminophen (TYLENOL) 500 MG tablet Take 500 mg by mouth every 6 (six) hours as needed for mild pain, fever or headache.     . albuterol (VENTOLIN HFA) 108 (90 Base) MCG/ACT inhaler TAKE 2 PUFFS BY MOUTH EVERY 6 HOURS AS NEEDED FOR WHEEZE OR SHORTNESS OF BREATH (Patient taking differently: Inhale 2 puffs into the lungs every 6 (six) hours as needed for wheezing or shortness of breath. ) 18 g 3  . amoxicillin-clavulanate (AUGMENTIN) 875-125 MG tablet Take 1 tablet by mouth 2 (two) times daily.    . benzonatate (TESSALON) 100 MG capsule Take 1 capsule (100 mg total) by mouth every 8 (eight) hours. 21 capsule 0  . budesonide (PULMICORT) 0.5 MG/2ML nebulizer solution Take 2 mLs (0.5 mg total) by nebulization 2 (two) times daily. 120 mL 0  . [facility] ASPIRIN EC 325 MG EC tablet TAKE 1 TABLET BY MOUTH EVERY DAY (Patient taking differently: Take 325 mg by mouth daily. ) 90 tablet 1  . Glycopyrrolate-Formoterol (BEVESPI AEROSPHERE) 9-4.8 MCG/ACT AERO Inhale 2 puffs into the lungs 2 (two) times daily. 10.7 g 11  . ipratropium-albuterol (DUONEB) 0.5-2.5 (3) MG/3ML SOLN Take 3 mLs by nebulization every 6 (six) hours as needed. 360 mL 0  . nicotine (NICODERM CQ - DOSED IN MG/24 HOURS) 14 mg/24hr patch Place 1 patch (14 mg total) onto the skin daily. For 6 weeks.  Then decrease to 7 mg daily. 28 patch 1  . nicotine polacrilex (NICORETTE) 4 MG gum Take 1 each (4 mg total) by mouth as needed for smoking cessation. (Patient  not taking: Reported on 11/15/2019) 100 tablet 0  . tamsulosin (FLOMAX) 0.4 MG CAPS capsule Take 0.4 mg by mouth at bedtime.    [name] amoxicillin-clavulanate (AUGMENTIN) 500-125 MG tablet Take by mouth.    . lactose free nutrition (BOOST) LIQD Take 237 mLs by mouth daily.    . Multiple Vitamin (MULTIVITAMIN WITH MINERALS) TABS tablet Take 1 tablet by mouth daily.      Review of Systems  Review of Systems  Constitutional: Positive for fatigue.  Respiratory: Positive for cough, shortness of breath and wheezing.   Neurological: Positive for weakness.     Physical Exam  BP 106/64 (BP Location: Left Arm, Cuff Size: Normal)   Pulse (!) 116   Temp 98.2 F (36.8 C) (Oral)   Ht 5\' 5"  (1.651 m)   Wt 107 lb 3.2 oz (48.6 kg)   SpO2 96%   BMI 17.84 kg/m  Physical Exam Constitutional:      Appearance: He is ill-appearing.  HENT:     Mouth/Throat:     Comments: Deferred d/t masking Cardiovascular:     Rate and Rhythm: Regular rhythm. Tachycardia present.  Pulmonary:     Breath  sounds: Rhonchi present.     Comments: Coarse rhonchi t/o Musculoskeletal:     Comments: In WC  Skin:    General: Skin is warm and dry.  Neurological:     General: No focal deficit present.     Mental Status: He is alert and oriented to person, place, and time. Mental status is at baseline.  Psychiatric:        Mood and Affect: Mood normal.        Behavior: Behavior normal.        Thought Content: Thought content normal.        Judgment: Judgment normal.      Lab Results:  CBC    Component Value Date/Time   WBC 6.5 11/16/2019 0357   RBC 4.01 (L) 11/16/2019 0357   HGB 11.0 (L) 11/16/2019 0357   HGB 14.8 08/03/2019 1135   HGB 14.6 07/16/2016 1220   HCT 33.3 (L) 11/16/2019 0357   HCT 44.7 07/16/2016 1220   PLT 378 11/16/2019 0357   PLT 413 (H) 08/03/2019 1135   PLT 367 07/16/2016 1220   MCV 83.0 11/16/2019 0357   MCV 89.0 07/16/2016 1220   MCH 27.4 11/16/2019 0357   MCHC 33.0 11/16/2019 0357    RDW 14.9 11/16/2019 0357   RDW 14.3 07/16/2016 1220   LYMPHSABS 0.8 11/15/2019 1243   LYMPHSABS 1.0 07/16/2016 1220   MONOABS 0.8 11/15/2019 1243   MONOABS 0.3 07/16/2016 1220   EOSABS 0.2 11/15/2019 1243   EOSABS 0.4 07/16/2016 1220   BASOSABS 0.0 11/15/2019 1243   BASOSABS 0.1 07/16/2016 1220    BMET    Component Value Date/Time   NA 136 11/16/2019 0357   NA 141 07/16/2016 1220   K 3.9 11/16/2019 0357   K 4.0 07/16/2016 1220   CL 104 11/16/2019 0357   CL 104 07/19/2012 1432   CO2 24 11/16/2019 0357   CO2 24 07/16/2016 1220   GLUCOSE 105 (H) 11/16/2019 0357   GLUCOSE 89 07/16/2016 1220   GLUCOSE 97 07/19/2012 1432   BUN 18 11/16/2019 0357   BUN 8.8 07/16/2016 1220   CREATININE 0.74 11/16/2019 0357   CREATININE 1.06 08/03/2019 1135   CREATININE 1.0 07/16/2016 1220   CALCIUM 8.6 (L) 11/16/2019 0357   CALCIUM 9.3 07/16/2016 1220   GFRNONAA >60 11/16/2019 0357   GFRNONAA >60 08/03/2019 1135   GFRAA >60 10/31/2019 0523   GFRAA >60 08/03/2019 1135    BNP    Component Value Date/Time   BNP 36.8 11/15/2019 1243    ProBNP No results found for: PROBNP  Imaging: DG Chest 2 View  Result Date: 11/09/2019 CLINICAL DATA:  Follow-up pneumonia EXAM: CHEST - 2 VIEW COMPARISON:  10/28/2019, 07/06/2015 CT 10/28/2019 FINDINGS: Resolved left suprahilar airspace disease. Mild residual streaky right infrahilar opacity. Paramediastinal distortion and elevation of the hila presumably due to post treatment change is unchanged. Normal heart size. No pneumothorax. Symmetrical small nodular opacities at the lung bases probably represent nipple shadow. IMPRESSION: Resolved left suprahilar airspace disease. Mild residual streaky right infrahilar opacity, may reflect mild residual infiltrate. Presumed paramediastinal post treatment changes are otherwise unchanged. Electronically Signed   By: Donavan Foil M.D.   On: 11/09/2019 15:23   CT Head Wo Contrast  Result Date: 10/28/2019 CLINICAL DATA:   Non-small cell lung cancer now with dizziness and balance difficulties. Headache. EXAM: CT HEAD WITHOUT CONTRAST TECHNIQUE: Contiguous axial images were obtained from the base of the skull through the vertex without intravenous contrast. COMPARISON:  MRI brain 08/29/2018 FINDINGS: Brain: No mass effect or midline shift. No abnormal extra-axial fluid collections. Gray-white matter junctions are distinct. Basal cisterns are not effaced. No acute intracranial hemorrhage. No specific evidence of an intracranial mass although contrast-enhanced MRI would be the most sensitive modality for detection of intracranial metastatic disease. Vascular: No hyperdense vessel or unexpected calcification. Skull: Calvarium appears intact.  No focal lesions identified. Sinuses/Orbits: Paranasal sinuses and mastoid air cells are clear. Other: None. IMPRESSION: No acute intracranial abnormalities. No specific evidence of an intracranial mass although contrast-enhanced MRI would be the most sensitive modality for detection of intracranial metastatic disease. Electronically Signed   By: Lucienne Capers M.D.   On: 10/28/2019 22:28   CT Angio Chest PE W and/or Wo Contrast  Result Date: 10/28/2019 CLINICAL DATA:  New onset chest pain and epigastric pain. History of lung cancer. EXAM: CT ANGIOGRAPHY CHEST CT ABDOMEN AND PELVIS WITH CONTRAST TECHNIQUE: Multidetector CT imaging of the chest was performed using the standard protocol during bolus administration of intravenous contrast. Multiplanar CT image reconstructions and MIPs were obtained to evaluate the vascular anatomy. Multidetector CT imaging of the abdomen and pelvis was performed using the standard protocol during bolus administration of intravenous contrast. CONTRAST:  154mL OMNIPAQUE IOHEXOL 350 MG/ML SOLN COMPARISON:  CT chest 08/03/2019.  PET-CT 03/30/2019 FINDINGS: CTA CHEST FINDINGS Cardiovascular: Good opacification of the central and segmental pulmonary arteries. No focal  filling defects. No evidence of significant pulmonary embolus. Normal heart size. No pericardial effusions. Normal caliber thoracic aorta. Great vessels are patent. Mild aortic calcification. 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 Mediastinum/Nodes: Right hilar soft tissue or lymphadenopathy measuring 1.7 cm diameter. Consolidation and volume loss along the medial aspect of the mediastinum bilaterally. Left anterior paramediastinal soft tissue. This is likely post radiation change. Similar appearance to previous study. Esophagus is decompressed. Moderate prominence of paraesophageal and para-aortic lymph nodes at the crus of the diaphragm, measuring up to about 1 cm short axis dimension but more prominent than on prior study. Possible metastatic disease. Lungs/Pleura: Scarring in the medial lungs is likely post radiation change. Bronchiectasis and bronchial wall thickening. New areas of airspace disease in the posterior left upper lung and in the right lung base possibly representing superimposed pneumonia. No pleural effusions. No pneumothorax. Musculoskeletal: No destructive bone lesions. Review of the MIP images confirms the above findings. CT ABDOMEN and PELVIS FINDINGS Hepatobiliary: No focal liver abnormality is seen. No gallstones, gallbladder wall thickening, or biliary dilatation. Pancreas: Unremarkable. No pancreatic ductal dilatation or surrounding inflammatory changes. Spleen: Normal in size without focal abnormality. Adrenals/Urinary Tract: Adrenal glands are unremarkable. Kidneys are normal, without renal calculi, focal lesion, or hydronephrosis. Bladder is unremarkable. Stomach/Bowel: Stomach, small bowel, and colon are mostly decompressed with scattered stool in the colon. No inflammatory changes are appreciated. The appendix is normal. Vascular/Lymphatic: Calcification of the aorta. No aneurysm. Moderately prominent retroperitoneal para-aortic  lymphadenopathy with individual lymph nodes measuring up to about 9 mm short axis dimension. This is developing since previous study and may represent metastatic disease. Reproductive: Marked enlargement of the prostate gland, measuring 7 cm in diameter. Other: No free air or free fluid in the abdomen. Abdominal wall musculature appears intact. Musculoskeletal: No destructive bone lesions. Review of the MIP images confirms the above findings. IMPRESSION: 1. No evidence of significant pulmonary embolus. 2. Consolidation and volume loss along the medial aspect of the mediastinum bilaterally, likely post radiation change. Similar appearance to previous  study. 3. New areas of airspace disease in the posterior left upper lung and in the right lung base possibly representing superimposed pneumonia. 4. Right hilar soft tissue or lymphadenopathy measuring 1.7 cm diameter, unchanged. 5. Moderate prominence of paraesophageal and para-aortic lymph nodes at the crus of the diaphragm, more prominent than on prior study. Possible metastatic disease. 6. Moderately prominent retroperitoneal para-aortic lymph nodes measuring up to about 9 mm short axis dimension, developing since previous study and may represent metastatic disease. 7. Marked enlargement of the prostate gland. 8. 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 9. Aortic atherosclerosis. Aortic Atherosclerosis (ICD10-I70.0). Electronically Signed   By: Lucienne Capers M.D.   On: 10/28/2019 22:22   CT ABDOMEN PELVIS W CONTRAST  Result Date: 10/28/2019 CLINICAL DATA:  New onset chest pain and epigastric pain. History of lung cancer. EXAM: CT ANGIOGRAPHY CHEST CT ABDOMEN AND PELVIS WITH CONTRAST TECHNIQUE: Multidetector CT imaging of the chest was performed using the standard protocol during bolus administration of intravenous contrast. Multiplanar CT image reconstructions and MIPs were obtained to evaluate the vascular  anatomy. Multidetector CT imaging of the abdomen and pelvis was performed using the standard protocol during bolus administration of intravenous contrast. CONTRAST:  154mL OMNIPAQUE IOHEXOL 350 MG/ML SOLN COMPARISON:  CT chest 08/03/2019.  PET-CT 03/30/2019 FINDINGS: CTA CHEST FINDINGS Cardiovascular: Good opacification of the central and segmental pulmonary arteries. No focal filling defects. No evidence of significant pulmonary embolus. Normal heart size. No pericardial effusions. Normal caliber thoracic aorta. Great vessels are patent. Mild aortic calcification. 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 Mediastinum/Nodes: Right hilar soft tissue or lymphadenopathy measuring 1.7 cm diameter. Consolidation and volume loss along the medial aspect of the mediastinum bilaterally. Left anterior paramediastinal soft tissue. This is likely post radiation change. Similar appearance to previous study. Esophagus is decompressed. Moderate prominence of paraesophageal and para-aortic lymph nodes at the crus of the diaphragm, measuring up to about 1 cm short axis dimension but more prominent than on prior study. Possible metastatic disease. Lungs/Pleura: Scarring in the medial lungs is likely post radiation change. Bronchiectasis and bronchial wall thickening. New areas of airspace disease in the posterior left upper lung and in the right lung base possibly representing superimposed pneumonia. No pleural effusions. No pneumothorax. Musculoskeletal: No destructive bone lesions. Review of the MIP images confirms the above findings. CT ABDOMEN and PELVIS FINDINGS Hepatobiliary: No focal liver abnormality is seen. No gallstones, gallbladder wall thickening, or biliary dilatation. Pancreas: Unremarkable. No pancreatic ductal dilatation or surrounding inflammatory changes. Spleen: Normal in size without focal abnormality. Adrenals/Urinary Tract: Adrenal glands are unremarkable.  Kidneys are normal, without renal calculi, focal lesion, or hydronephrosis. Bladder is unremarkable. Stomach/Bowel: Stomach, small bowel, and colon are mostly decompressed with scattered stool in the colon. No inflammatory changes are appreciated. The appendix is normal. Vascular/Lymphatic: Calcification of the aorta. No aneurysm. Moderately prominent retroperitoneal para-aortic lymphadenopathy with individual lymph nodes measuring up to about 9 mm short axis dimension. This is developing since previous study and may represent metastatic disease. Reproductive: Marked enlargement of the prostate gland, measuring 7 cm in diameter. Other: No free air or free fluid in the abdomen. Abdominal wall musculature appears intact. Musculoskeletal: No destructive bone lesions. Review of the MIP images confirms the above findings. IMPRESSION: 1. No evidence of significant pulmonary embolus. 2. Consolidation and volume loss along the medial aspect of the mediastinum bilaterally, likely post radiation change. Similar  appearance to previous study. 3. New areas of airspace disease in the posterior left upper lung and in the right lung base possibly representing superimposed pneumonia. 4. Right hilar soft tissue or lymphadenopathy measuring 1.7 cm diameter, unchanged. 5. Moderate prominence of paraesophageal and para-aortic lymph nodes at the crus of the diaphragm, more prominent than on prior study. Possible metastatic disease. 6. Moderately prominent retroperitoneal para-aortic lymph nodes measuring up to about 9 mm short axis dimension, developing since previous study and may represent metastatic disease. 7. Marked enlargement of the prostate gland. 8. 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 9. Aortic atherosclerosis. Aortic Atherosclerosis (ICD10-I70.0). Electronically Signed   By: Lucienne Capers M.D.   On: 10/28/2019 22:22   DG Chest Port 1 View  Result Date:  11/16/2019 CLINICAL DATA:  Lung cancer.  Acute exacerbation COPD. EXAM: PORTABLE CHEST 1 VIEW COMPARISON:  CTA chest 10/28/2019 FINDINGS: Heart size is normal. Superior mediastinal soft tissue is again noted. Peripheral nodular opacity in the right lung is increasing in size. No edema is present. No effusions are present. IMPRESSION: Increasing size of peripheral nodular opacity in the right lung. Stable soft tissue the superior mediastinum. No acute cardiopulmonary disease. Electronically Signed   By: San Morelle M.D.   On: 11/16/2019 06:44   DG Chest Portable 1 View  Result Date: 10/28/2019 CLINICAL DATA:  Shortness of breath.  History of lung cancer. EXAM: PORTABLE CHEST 1 VIEW COMPARISON:  CT chest dated July 04, 2019 FINDINGS: Persistent medial bilateral upper lung zone airspace opacification is noted which is likely related to prior treatment. There are parent new opacities in the left upper lung zone and right infrahilar region. The heart size appears stable. Aortic calcifications are noted. There is no pneumothorax. There is stable elevation of the left hemidiaphragm. No definite acute osseous abnormality. IMPRESSION: 1. Suggestion of new airspace opacities in the right infrahilar region and left upper lobe which may indicate developing infiltrates in the appropriate clinical setting. 2. Persistent post treatment changes in the bilateral upper lung zones. 3. Stable elevation of the left hemidiaphragm. 4. No convincing large pleural effusion. Electronically Signed   By: Constance Holster M.D.   On: 10/28/2019 18:10     Assessment & Plan:   Acute exacerbation of chronic obstructive pulmonary disease (COPD) ([place]) - Admitted for bilateral pneumonia September 28th 2021, treated with IV Rocephin/Azithromycin and Solumedrol. He was discharged on prednisone, amoxicillin and doxycyline. Clinically he continues to deteriorate in outpatient setting despite CXR showing improvemnet. Dr. Elsworth Soho in room  today to evaluate patient, feels he needs to be admitted for COPD exacerbation. He will be directly admitted to [facility]. Report given to PCCM.   Primary cancer of left lower lobe of lung ([place]) - Follows with Dr. Earlie Server, seen on 11/06/19 he felt there was no concerning findings for disease progression. Continue surveillance.   COPD (chronic obstructive pulmonary disease) ([place]) - Continue Bevespi two puffs twice daily - Added Pulmicort and Duonebs last visit but awaiting nebulizer machine (re-sent DME order today)   Martyn Ehrich, NP 11/16/2019   Independently examined pt, evaluated data & formulated above care plan with NP Volanda Napoleon  Reviewed recent hospital admission, last imaging studies. He appears significantly short of breath and in some distress, in a wheelchair, son accompanies.  He has recently received  steroids and course of antibiotics.  There are some concern for metastatic disease in his abdomen although CT chest does not show  any evidence of recurrent malignancy. On exam -mild increased work of breathing, mild accessory muscle use, bilateral scattered rhonchi, S1-S2 tacky I advised him hospital admission, we will arrange for direct admission from the office   Rakesh V. Elsworth Soho MD

## 2019-11-16 ENCOUNTER — Inpatient Hospital Stay (HOSPITAL_COMMUNITY): Payer: Medicare PPO

## 2019-11-16 ENCOUNTER — Ambulatory Visit: Payer: Medicare PPO | Admitting: Primary Care

## 2019-11-16 LAB — BASIC METABOLIC PANEL
Anion gap: 8 (ref 5–15)
BUN: 18 mg/dL (ref 8–23)
CO2: 24 mmol/L (ref 22–32)
Calcium: 8.6 mg/dL — ABNORMAL LOW (ref 8.9–10.3)
Chloride: 104 mmol/L (ref 98–111)
Creatinine, Ser: 0.74 mg/dL (ref 0.61–1.24)
GFR, Estimated: >60 mL/min (ref >60)
Glucose, Bld: 105 mg/dL — ABNORMAL HIGH (ref 70–99)
Potassium: 3.9 mmol/L (ref 3.5–5.1)
Sodium: 136 mmol/L (ref 135–145)

## 2019-11-16 LAB — CBC
HCT: 33.3 % — ABNORMAL LOW (ref 39.0–52.0)
Hemoglobin: 11 g/dL — ABNORMAL LOW (ref 13.0–17.0)
MCH: 27.4 pg (ref 26.0–34.0)
MCHC: 33 g/dL (ref 30.0–36.0)
MCV: 83 fL (ref 80.0–100.0)
Platelets: 378 10*3/uL (ref 150–400)
RBC: 4.01 MIL/uL — ABNORMAL LOW (ref 4.22–5.81)
RDW: 14.9 % (ref 11.5–15.5)
WBC: 6.5 10*3/uL (ref 4.0–10.5)
nRBC: 0 % (ref 0.0–0.2)

## 2019-11-16 MED ORDER — CHLORHEXIDINE GLUCONATE CLOTH 2 % EX PADS
6.0000 | MEDICATED_PAD | Freq: Every day | CUTANEOUS | Status: DC
  Administered 2019-11-16 – 2019-11-19 (×4): 6 via TOPICAL

## 2019-11-16 MED ORDER — IOHEXOL 350 MG/ML SOLN
100.0000 mL | Freq: Once | INTRAVENOUS | Status: AC | PRN
  Administered 2019-11-16: 100 mL via INTRAVENOUS

## 2019-11-16 MED ORDER — ADULT MULTIVITAMIN W/MINERALS CH
1.0000 | ORAL_TABLET | Freq: Every day | ORAL | Status: DC
  Administered 2019-11-16 – 2019-11-20 (×5): 1 via ORAL
  Filled 2019-11-16 (×5): qty 1

## 2019-11-16 MED ORDER — SODIUM CHLORIDE 0.9 % IV SOLN: 2.0000 g | Freq: Two times a day (BID) | INTRAVENOUS | Status: DC

## 2019-11-16 MED ORDER — PROSOURCE PLUS PO LIQD
30.0000 mL | Freq: Two times a day (BID) | ORAL | Status: DC
  Administered 2019-11-16 – 2019-11-20 (×8): 30 mL via ORAL
  Filled 2019-11-16 (×8): qty 30

## 2019-11-16 MED ORDER — SODIUM CHLORIDE 0.9 % IV SOLN
3.0000 g | Freq: Four times a day (QID) | INTRAVENOUS | Status: DC
  Administered 2019-11-16 – 2019-11-19 (×10): 3 g via INTRAVENOUS
  Filled 2019-11-16 (×8): qty 3
  Filled 2019-11-16: qty 8
  Filled 2019-11-16 (×2): qty 3

## 2019-11-16 NOTE — Progress Notes (Signed)
Initial Nutrition Assessment  DOCUMENTATION CODES:   Severe malnutrition in context of chronic illness, Underweight  INTERVENTION:   -Ensure Enlive po BID, each supplement provides 350 kcal and 20 grams of protein -Prosource Plus PO BID, each provides 100 kcals and 15g protein  NUTRITION DIAGNOSIS:   Severe Malnutrition related to chronic illness, cancer and cancer related treatments as evidenced by percent weight loss, severe fat depletion, severe muscle depletion.  GOAL:   Patient will meet greater than or equal to 90% of their needs  MONITOR:   PO intake, Supplement acceptance, Labs, Weight trends, I & O's  REASON FOR ASSESSMENT:   Consult Assessment of nutrition requirement/status  ASSESSMENT:   69 year old with stage III non-small cell lung cancer, COPD, recent admission for right lower lobe pneumonia with recent ER visit on 10/13 for shortness of breath.  Admitted from the pulmonary office with weakness, shortness of breath, cough.  Patient in room, pretty SOB during conversation. 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 Pt reports he likes Ensure supplements but is not drinking as many d/t they cause him to have to use the bathroom more frequently which isn't that convenient in his hospital room. Pt as willing to try Prosource supplements as they are lower volume.  Encouraged pt to consume protein foods with every meal. Pt states he did eat eggs and bacon this morning for breakfast.  Per weight records, pt has lost 32 lbs since 3/2 (32% wt loss x 7/5 months, significant for time frame).   Labs reviewed. Medications: Multivitamin with minerals daily  NUTRITION - FOCUSED PHYSICAL EXAM:    Most Recent Value  Orbital Region Moderate depletion  Upper Arm Region Severe depletion  Thoracic and Lumbar Region Unable to assess  Buccal Region Severe depletion  Temple Region Moderate depletion  Clavicle Bone Region Severe depletion  Clavicle and Acromion Bone Region  Severe depletion  Scapular Bone Region Severe depletion  Dorsal Hand Moderate depletion  Patellar Region Unable to assess  Anterior Thigh Region Unable to assess  Posterior Calf Region Unable to assess  Edema (RD Assessment) None  Hair Unable to assess  Eyes Reviewed  Mouth Reviewed  Skin Reviewed  Nails Reviewed       Diet Order:   Diet Order            Diet regular Room service appropriate? Yes; Fluid consistency: Thin  Diet effective now                 EDUCATION NEEDS:   Education needs have been addressed  Skin:  Skin Assessment: Reviewed RN Assessment  Last BM:  10/14  Height:   Ht Readings from Last 1 Encounters:  11/15/19 5\' 5"  (1.651 m)    Weight:   Wt Readings from Last 1 Encounters:  11/16/19 45.6 kg   BMI:  Body mass index is 17.5 kg/m. -adjusted for rt BKA  Estimated Nutritional Needs:   Kcal:  1600-1850  Protein:  85-100g  Fluid:  1.8L/day   Clayton Bibles, MS, RD, LDN Inpatient Clinical Dietitian Contact information available via Amion

## 2019-11-16 NOTE — Patient Instructions (Signed)
Direct admit to [facility]

## 2019-11-16 NOTE — Assessment & Plan Note (Addendum)
-   Admitted for bilateral pneumonia September 28th 2021, treated with IV Rocephin/Azithromycin and Solumedrol. He was discharged on prednisone, amoxicillin and doxycyline. Clinically he continues to deteriorate in outpatient setting despite CXR showing improvemnet. Dr. Elsworth Soho in room today to evaluate patient, feels he needs to be admitted for COPD exacerbation. He will be directly admitted to [facility]. Report given to PCCM.

## 2019-11-16 NOTE — Assessment & Plan Note (Addendum)
-   Continue Bevespi two puffs twice daily - Added Pulmicort and Duonebs last visit but awaiting nebulizer machine (re-sent DME order today)

## 2019-11-16 NOTE — Assessment & Plan Note (Addendum)
-   Follows with Dr. Earlie Server, seen on 11/06/19 he felt there was no concerning findings for disease progression. Continue surveillance.
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
## 2019-11-16 NOTE — Progress Notes (Signed)
Pharmacy Antibiotic Note  Evan [name] is a 69 y.o. male admitted on 11/15/2019 with pneumonia.  Pharmacy has been consulted for cefepime dosing.  Today, 11/16/2019:  D2 cefepime  Afebrile  WBC WNL  SCr unchanged but weight updated so CrCl now < 60 ml/min  Stable on RA  Plan:  Adjust Cefepime 2g IV q12 with updated weight  Pharmacy will sign off, following peripherally for culture results or dose adjustments. Please reconsult if a change in clinical status warrants re-evaluation of dosage.  Height: 5\' 5"  (165.1 cm) Weight: 45.6 kg (100 lb 8.5 oz) IBW/kg (Calculated) : 61.5  Temp (24hrs), Avg:98.1 F (36.7 C), Min:98 F (36.7 C), Max:98.2 F (36.8 C)  Recent Labs  Lab 11/09/19 2136 11/15/19 1243 11/16/19 0357  WBC 8.0 7.1 6.5  CREATININE 0.76 0.91 0.74    Estimated Creatinine Clearance: 57 mL/min (by C-G formula based on SCr of 0.74 mg/dL).    Allergies  Allergen Reactions  . Chantix [Varenicline] Other (See Comments)    Weird dreams    Thank you for allowing pharmacy to be a part of this patient's care.  Analyn Matusek A 11/16/2019 11:37 AM

## 2019-11-17 DIAGNOSIS — E43 Unspecified severe protein-calorie malnutrition: Secondary | ICD-10-CM | POA: Insufficient documentation

## 2019-11-17 LAB — URINE CULTURE: Culture: >=100000 — AB

## 2019-11-17 MED ORDER — METHYLPREDNISOLONE SODIUM SUCC 125 MG IJ SOLR
40.0000 mg | Freq: Four times a day (QID) | INTRAMUSCULAR | Status: DC
  Administered 2019-11-17 – 2019-11-19 (×9): 40 mg via INTRAVENOUS
  Filled 2019-11-17 (×9): qty 2
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
## 2019-11-17 NOTE — Progress Notes (Signed)
Patient unable to finish nebs due to pt being SOB

## 2019-11-18 LAB — BASIC METABOLIC PANEL
Anion gap: 10 (ref 5–15)
BUN: 18 mg/dL (ref 8–23)
CO2: 23 mmol/L (ref 22–32)
Calcium: 8.9 mg/dL (ref 8.9–10.3)
Chloride: 103 mmol/L (ref 98–111)
Creatinine, Ser: 0.62 mg/dL (ref 0.61–1.24)
GFR, Estimated: >60 mL/min (ref >60)
Glucose, Bld: 166 mg/dL — ABNORMAL HIGH (ref 70–99)
Potassium: 4.1 mmol/L (ref 3.5–5.1)
Sodium: 136 mmol/L (ref 135–145)

## 2019-11-18 LAB — CULTURE, RESPIRATORY W GRAM STAIN
Culture: NORMAL
Gram Stain: NONE SEEN
Special Requests: NORMAL

## 2019-11-18 LAB — CBC
HCT: 32.3 % — ABNORMAL LOW (ref 39.0–52.0)
Hemoglobin: 10.6 g/dL — ABNORMAL LOW (ref 13.0–17.0)
MCH: 27 pg (ref 26.0–34.0)
MCHC: 32.8 g/dL (ref 30.0–36.0)
MCV: 82.2 fL (ref 80.0–100.0)
Platelets: 472 10*3/uL — ABNORMAL HIGH (ref 150–400)
RBC: 3.93 MIL/uL — ABNORMAL LOW (ref 4.22–5.81)
RDW: 14.6 % (ref 11.5–15.5)
WBC: 7.8 10*3/uL (ref 4.0–10.5)
nRBC: 0 % (ref 0.0–0.2)

## 2019-11-18 LAB — PHOSPHORUS: Phosphorus: 2.1 mg/dL — ABNORMAL LOW (ref 2.5–4.6)

## 2019-11-18 LAB — MAGNESIUM: Magnesium: 2.2 mg/dL (ref 1.7–2.4)
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
## 2019-11-19 DIAGNOSIS — E43 Unspecified severe protein-calorie malnutrition: Secondary | ICD-10-CM

## 2019-11-19 LAB — BASIC METABOLIC PANEL
Anion gap: 11 (ref 5–15)
BUN: 18 mg/dL (ref 8–23)
CO2: 26 mmol/L (ref 22–32)
Calcium: 8.8 mg/dL — ABNORMAL LOW (ref 8.9–10.3)
Chloride: 101 mmol/L (ref 98–111)
Creatinine, Ser: 0.62 mg/dL (ref 0.61–1.24)
GFR, Estimated: >60 mL/min (ref >60)
Glucose, Bld: 161 mg/dL — ABNORMAL HIGH (ref 70–99)
Potassium: 3.8 mmol/L (ref 3.5–5.1)
Sodium: 138 mmol/L (ref 135–145)

## 2019-11-19 LAB — CBC
HCT: 32.7 % — ABNORMAL LOW (ref 39.0–52.0)
Hemoglobin: 10.6 g/dL — ABNORMAL LOW (ref 13.0–17.0)
MCH: 27.2 pg (ref 26.0–34.0)
MCHC: 32.4 g/dL (ref 30.0–36.0)
MCV: 83.8 fL (ref 80.0–100.0)
Platelets: 543 10*3/uL — ABNORMAL HIGH (ref 150–400)
RBC: 3.9 MIL/uL — ABNORMAL LOW (ref 4.22–5.81)
RDW: 15.1 % (ref 11.5–15.5)
WBC: 15.1 10*3/uL — ABNORMAL HIGH (ref 4.0–10.5)
nRBC: 0 % (ref 0.0–0.2)

## 2019-11-19 LAB — PHOSPHORUS: Phosphorus: 2.4 mg/dL — ABNORMAL LOW (ref 2.5–4.6)

## 2019-11-19 LAB — MAGNESIUM: Magnesium: 2.2 mg/dL (ref 1.7–2.4)

## 2019-11-19 LAB — GLUCOSE, CAPILLARY: Glucose-Capillary: 160 mg/dL — ABNORMAL HIGH (ref 70–99)

## 2019-11-19 MED ORDER — PREDNISONE 20 MG PO TABS
40.0000 mg | ORAL_TABLET | Freq: Every day | ORAL | Status: DC
  Administered 2019-11-20: 40 mg via ORAL
  Filled 2019-11-19: qty 2

## 2019-11-19 MED ORDER — LEVOFLOXACIN 750 MG PO TABS
750.0000 mg | ORAL_TABLET | Freq: Every day | ORAL | Status: AC
  Administered 2019-11-19 – 2019-11-20 (×2): 750 mg via ORAL
  Filled 2019-11-19 (×2): qty 1

## 2019-11-19 MED ORDER — METHYLPREDNISOLONE SODIUM SUCC 125 MG IJ SOLR
40.0000 mg | Freq: Two times a day (BID) | INTRAMUSCULAR | Status: AC
  Administered 2019-11-19: 40 mg via INTRAVENOUS
  Filled 2019-11-19: qty 2

## 2019-11-19 NOTE — Care Management Important Message (Signed)
Important Message  Patient Details IM Letter given to Patient Name: Evan [name] MRN: [id number] Date of Birth: 1950/03/26   Medicare Important Message Given:  Yes     Kerin Salen 11/19/2019, 10:47 AM
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
## 2019-11-19 NOTE — Progress Notes (Signed)
PT is in bedside chair and getting ready to eat lunch. PT states he will do Flutter as CPT for 1400 visit.

## 2019-11-20 MED ORDER — PREDNISONE 20 MG PO TABS: ORAL_TABLET | ORAL | 0 refills | Status: DC

## 2019-11-20 NOTE — Progress Notes (Signed)
PT refuses CPT with vest at this time- has committed to using Flutter as 0800 CPT. PT has very productive cough at this time- moderate, white, thick.

## 2019-11-20 NOTE — TOC Transition Note (Signed)
Transition of Care [facility]) - CM/SW Discharge Note   Patient Details  Name: Evan [name] MRN: [id number] Date of Birth: 04/24/1950  [facility] CM/SW Contact:  Ross Ludwig, LCSW Phone Number: 11/20/2019, 4:52 PM   Clinical Narrative:     CSW was informed that patient needs a walker and nebulizer, CSW contacted [employer], and they will deliver equipment before patient leaves.  Final next level of care: Home/Self Care Barriers to Discharge: Barriers Resolved   Patient Goals and CMS Choice Patient states their goals for this hospitalization and ongoing recovery are:: To return back home CMS Medicare.gov Compare Post Acute Care list provided to:: Patient Choice offered to / list presented to : Patient  Discharge Placement  Patient will be discharging back home today once equipment is delivered.            Discharge Plan and Services                DME Arranged: Nebulizer machine, Walker rolling DME Agency: [employer] Date DME Agency Contacted: 11/20/19 Time DME Agency Contacted: [phone] Representative spoke with at DME Agency: Freda Munro [facility] Arranged: NA          Social Determinants of Health (Long) Interventions     Readmission Risk Interventions No flowsheet data found.

## 2019-11-20 NOTE — Discharge Summary (Signed)
Physician Discharge Summary   Patient ID: HILMAN KISSLING [id number] 69 y.o. [birth year]  Admit date: 11/15/2019  Discharge date and time: No discharge date for patient encounter.   Admitting Physician: Marshell Garfinkel, MD   Discharge Physician: Lanier Clam MD  Admission Diagnoses: Acute exacerbation of chronic obstructive pulmonary disease (COPD) ([place]) [J44.1]  Discharge Diagnoses: Active Problems:   Acute exacerbation of chronic obstructive pulmonary disease (COPD) (H[facility])   Protein-calorie malnutrition, severe RML nodule  Admission Condition: serious  Discharged Condition: fair  Indication for Admission: COPD exacerbation  Hospital Course:   Mr. Demas is a 69 year old man with PMH COPD, lung cancer who was admitted from clinic with increased cough, SOB with concern for exacerbation of COPD.  CXR demonstrated radiographic improvement from admission for Pna in 10/2019. He was very wheezy and rhonchrous, producing sputum. He was given IV steroids as well as scheduled and as needed bronchodilators via nebulization. He was placed on abx to complete therapy for COPD exacerbation eventually completing 5 day course of levofloxacin. WOB and wheezing gradually improved. Still with productive cough. Evaluated by PT and deemed most appropriate for PT services at home. Discharged to resume home bevespi. Counseled on importance of addition of ICS via previously prescribed nebulized budesonide. Nebulizer was provided via DME at discharge. To continue as needed albuterol inhaler and nebulized duonebs. He will complete a brief steroid taper as an outpatient   Consults: PT  Significant Diagnostic Studies: labs: reviewed with abs eos elevated earlier in 2021, consider asthma overlap if respiratory issues continue to be difficult to control   Treatments: antibiotics: Levaquin and steroids: prednisone  Discharge Exam: Gen: well appearing, in NAD Respiratory: NWOB on RA, no wheeze - improved  from yesterday, rhonchi bilaterally on inspiration  Disposition: Discharge disposition: 06-Home-Health Care Svc       Patient Instructions:  Allergies as of 11/20/2019      Reactions   Chantix [varenicline] Other (See Comments)   Weird dreams      Medication List    STOP taking these medications   amoxicillin-clavulanate 875-125 MG tablet Commonly known as: AUGMENTIN     TAKE these medications   acetaminophen 500 MG tablet Commonly known as: TYLENOL Take 500 mg by mouth every 6 (six) hours as needed for mild pain, fever or headache.   albuterol 108 (90 Base) MCG/ACT inhaler Commonly known as: VENTOLIN HFA TAKE 2 PUFFS BY MOUTH EVERY 6 HOURS AS NEEDED FOR WHEEZE OR SHORTNESS OF BREATH What changed: See the new instructions.   benzonatate 100 MG capsule Commonly known as: TESSALON Take 1 capsule (100 mg total) by mouth every 8 (eight) hours.   Bevespi Aerosphere 9-4.8 MCG/ACT Aero Generic drug: Glycopyrrolate-Formoterol Inhale 2 puffs into the lungs 2 (two) times daily.   budesonide 0.5 MG/2ML nebulizer solution Commonly known as: Pulmicort Take 2 mLs (0.5 mg total) by nebulization 2 (two) times daily.   [employer] Aspirin EC 325 MG EC tablet Generic drug: aspirin TAKE 1 TABLET BY MOUTH EVERY DAY What changed: how much to take   guaiFENesin 600 MG 12 hr tablet Commonly known as: MUCINEX Take 600 mg by mouth 2 (two) times daily as needed for cough.   guaiFENesin 100 MG/5ML liquid Commonly known as: ROBITUSSIN Take 200 mg by mouth 3 (three) times daily as needed for cough.   ipratropium-albuterol 0.5-2.5 (3) MG/3ML Soln Commonly known as: DUONEB Take 3 mLs by nebulization every 6 (six) hours as needed.   nicotine 14 mg/24hr patch Commonly  known as: NICODERM CQ - dosed in mg/24 hours Place 1 patch (14 mg total) onto the skin daily. For 6 weeks.  Then decrease to 7 mg daily.   nicotine polacrilex 4 MG gum Commonly known as: NICORETTE Take 1 each (4 mg total) by  mouth as needed for smoking cessation.   predniSONE 20 MG tablet Commonly known as: DELTASONE Take 2 tablets (40 mg total) by mouth daily with breakfast for 1 day, THEN 1 tablet (20 mg total) daily with breakfast for 3 days. Start taking on: November 20, 2019   tamsulosin 0.4 MG Caps capsule Commonly known as: FLOMAX Take 0.4 mg by mouth at bedtime.            Durable Medical Equipment  (From admission, onward)         Start     Ordered   11/20/19 1437  For home use only DME Walker rolling  Once       Question Answer Comment  Walker: With 5 Inch Wheels   Patient needs a walker to treat with the following condition Unsteady gait      11/20/19 1436   11/20/19 0000  For home use only DME Nebulizer machine       Question Answer Comment  Patient needs a nebulizer to treat with the following condition COPD (chronic obstructive pulmonary disease) ([place])   Length of Need Lifetime      11/20/19 1139         Activity: activity as tolerated Diet: regular diet Wound Care: none needed  Follow-up with Pulmonary in 2-4 weeks.  SignedBonna Gains [facility] 11/20/2019 2:46 PM

## 2019-11-20 NOTE — Progress Notes (Addendum)
Discharge to home instructions reviewed with son and pt acknowledged understanding, equipment delivered.  SRP, RN

## 2019-11-21 ENCOUNTER — Inpatient Hospital Stay (HOSPITAL_COMMUNITY)
Admission: EM | Admit: 2019-11-21 | Discharge: 2019-12-16 | DRG: 034 | Disposition: A | Discharge status: Hospice | Payer: Medicare PPO | Attending: Internal Medicine | Admitting: Internal Medicine

## 2019-11-21 ENCOUNTER — Emergency Department (HOSPITAL_COMMUNITY): Payer: Medicare PPO

## 2019-11-21 ENCOUNTER — Inpatient Hospital Stay (HOSPITAL_COMMUNITY): Payer: Medicare PPO

## 2019-11-21 ENCOUNTER — Encounter (HOSPITAL_COMMUNITY): Payer: Self-pay | Admitting: *Deleted

## 2019-11-21 DIAGNOSIS — I6521 Occlusion and stenosis of right carotid artery: Secondary | ICD-10-CM

## 2019-11-21 DIAGNOSIS — Z7952 Long term (current) use of systemic steroids: Secondary | ICD-10-CM

## 2019-11-21 DIAGNOSIS — R609 Edema, unspecified: Secondary | ICD-10-CM | POA: Diagnosis not present

## 2019-11-21 DIAGNOSIS — Z87891 Personal history of nicotine dependence: Secondary | ICD-10-CM

## 2019-11-21 DIAGNOSIS — A419 Sepsis, unspecified organism: Secondary | ICD-10-CM | POA: Diagnosis not present

## 2019-11-21 DIAGNOSIS — S98911A Complete traumatic amputation of right foot, level unspecified, initial encounter: Secondary | ICD-10-CM | POA: Diagnosis present

## 2019-11-21 DIAGNOSIS — I771 Stricture of artery: Secondary | ICD-10-CM

## 2019-11-21 DIAGNOSIS — I82C12 Acute embolism and thrombosis of left internal jugular vein: Secondary | ICD-10-CM | POA: Diagnosis present

## 2019-11-21 DIAGNOSIS — C3411 Malignant neoplasm of upper lobe, right bronchus or lung: Secondary | ICD-10-CM | POA: Diagnosis present

## 2019-11-21 DIAGNOSIS — I959 Hypotension, unspecified: Secondary | ICD-10-CM | POA: Diagnosis not present

## 2019-11-21 DIAGNOSIS — R0989 Other specified symptoms and signs involving the circulatory and respiratory systems: Secondary | ICD-10-CM

## 2019-11-21 DIAGNOSIS — Z4659 Encounter for fitting and adjustment of other gastrointestinal appliance and device: Secondary | ICD-10-CM

## 2019-11-21 DIAGNOSIS — C3490 Malignant neoplasm of unspecified part of unspecified bronchus or lung: Secondary | ICD-10-CM | POA: Diagnosis not present

## 2019-11-21 DIAGNOSIS — J449 Chronic obstructive pulmonary disease, unspecified: Secondary | ICD-10-CM | POA: Diagnosis not present

## 2019-11-21 DIAGNOSIS — R29810 Facial weakness: Secondary | ICD-10-CM | POA: Diagnosis not present

## 2019-11-21 DIAGNOSIS — Z515 Encounter for palliative care: Secondary | ICD-10-CM

## 2019-11-21 DIAGNOSIS — M7989 Other specified soft tissue disorders: Secondary | ICD-10-CM | POA: Diagnosis not present

## 2019-11-21 DIAGNOSIS — J189 Pneumonia, unspecified organism: Secondary | ICD-10-CM | POA: Diagnosis not present

## 2019-11-21 DIAGNOSIS — R471 Dysarthria and anarthria: Secondary | ICD-10-CM | POA: Diagnosis present

## 2019-11-21 DIAGNOSIS — R531 Weakness: Secondary | ICD-10-CM

## 2019-11-21 DIAGNOSIS — I9589 Other hypotension: Secondary | ICD-10-CM | POA: Diagnosis not present

## 2019-11-21 DIAGNOSIS — Y95 Nosocomial condition: Secondary | ICD-10-CM | POA: Diagnosis present

## 2019-11-21 DIAGNOSIS — R1310 Dysphagia, unspecified: Secondary | ICD-10-CM | POA: Diagnosis not present

## 2019-11-21 DIAGNOSIS — R0609 Other forms of dyspnea: Secondary | ICD-10-CM | POA: Diagnosis not present

## 2019-11-21 DIAGNOSIS — D62 Acute posthemorrhagic anemia: Secondary | ICD-10-CM | POA: Diagnosis present

## 2019-11-21 DIAGNOSIS — R338 Other retention of urine: Secondary | ICD-10-CM | POA: Diagnosis present

## 2019-11-21 DIAGNOSIS — F17200 Nicotine dependence, unspecified, uncomplicated: Secondary | ICD-10-CM | POA: Diagnosis not present

## 2019-11-21 DIAGNOSIS — Z833 Family history of diabetes mellitus: Secondary | ICD-10-CM

## 2019-11-21 DIAGNOSIS — Z888 Allergy status to other drugs, medicaments and biological substances status: Secondary | ICD-10-CM

## 2019-11-21 DIAGNOSIS — J431 Panlobular emphysema: Secondary | ICD-10-CM | POA: Diagnosis not present

## 2019-11-21 DIAGNOSIS — E875 Hyperkalemia: Secondary | ICD-10-CM | POA: Insufficient documentation

## 2019-11-21 DIAGNOSIS — J410 Simple chronic bronchitis: Secondary | ICD-10-CM | POA: Diagnosis not present

## 2019-11-21 DIAGNOSIS — R29703 NIHSS score 3: Secondary | ICD-10-CM | POA: Diagnosis present

## 2019-11-21 DIAGNOSIS — I639 Cerebral infarction, unspecified: Secondary | ICD-10-CM | POA: Diagnosis not present

## 2019-11-21 DIAGNOSIS — R310 Gross hematuria: Secondary | ICD-10-CM | POA: Diagnosis not present

## 2019-11-21 DIAGNOSIS — R6520 Severe sepsis without septic shock: Secondary | ICD-10-CM | POA: Diagnosis not present

## 2019-11-21 DIAGNOSIS — Z7189 Other specified counseling: Secondary | ICD-10-CM | POA: Diagnosis not present

## 2019-11-21 DIAGNOSIS — K5900 Constipation, unspecified: Secondary | ICD-10-CM | POA: Diagnosis present

## 2019-11-21 DIAGNOSIS — R627 Adult failure to thrive: Secondary | ICD-10-CM | POA: Diagnosis present

## 2019-11-21 DIAGNOSIS — Z7982 Long term (current) use of aspirin: Secondary | ICD-10-CM

## 2019-11-21 DIAGNOSIS — R918 Other nonspecific abnormal finding of lung field: Secondary | ICD-10-CM | POA: Diagnosis not present

## 2019-11-21 DIAGNOSIS — I10 Essential (primary) hypertension: Secondary | ICD-10-CM | POA: Diagnosis not present

## 2019-11-21 DIAGNOSIS — E785 Hyperlipidemia, unspecified: Secondary | ICD-10-CM | POA: Diagnosis present

## 2019-11-21 DIAGNOSIS — J440 Chronic obstructive pulmonary disease with acute lower respiratory infection: Secondary | ICD-10-CM | POA: Diagnosis present

## 2019-11-21 DIAGNOSIS — Z8673 Personal history of transient ischemic attack (TIA), and cerebral infarction without residual deficits: Secondary | ICD-10-CM

## 2019-11-21 DIAGNOSIS — I6389 Other cerebral infarction: Principal | ICD-10-CM | POA: Diagnosis present

## 2019-11-21 DIAGNOSIS — I8290 Acute embolism and thrombosis of unspecified vein: Secondary | ICD-10-CM | POA: Diagnosis present

## 2019-11-21 DIAGNOSIS — N401 Enlarged prostate with lower urinary tract symptoms: Secondary | ICD-10-CM | POA: Diagnosis present

## 2019-11-21 DIAGNOSIS — J9621 Acute and chronic respiratory failure with hypoxia: Secondary | ICD-10-CM | POA: Diagnosis present

## 2019-11-21 DIAGNOSIS — Z7951 Long term (current) use of inhaled steroids: Secondary | ICD-10-CM

## 2019-11-21 DIAGNOSIS — R0603 Acute respiratory distress: Secondary | ICD-10-CM

## 2019-11-21 DIAGNOSIS — R1312 Dysphagia, oropharyngeal phase: Secondary | ICD-10-CM | POA: Diagnosis not present

## 2019-11-21 DIAGNOSIS — R0602 Shortness of breath: Secondary | ICD-10-CM | POA: Diagnosis not present

## 2019-11-21 DIAGNOSIS — J439 Emphysema, unspecified: Secondary | ICD-10-CM | POA: Diagnosis not present

## 2019-11-21 DIAGNOSIS — Z79899 Other long term (current) drug therapy: Secondary | ICD-10-CM

## 2019-11-21 DIAGNOSIS — Z85118 Personal history of other malignant neoplasm of bronchus and lung: Secondary | ICD-10-CM

## 2019-11-21 DIAGNOSIS — Z8042 Family history of malignant neoplasm of prostate: Secondary | ICD-10-CM

## 2019-11-21 DIAGNOSIS — Z9221 Personal history of antineoplastic chemotherapy: Secondary | ICD-10-CM

## 2019-11-21 DIAGNOSIS — Z681 Body mass index (BMI) 19 or less, adult: Secondary | ICD-10-CM

## 2019-11-21 DIAGNOSIS — R1311 Dysphagia, oral phase: Secondary | ICD-10-CM | POA: Diagnosis not present

## 2019-11-21 DIAGNOSIS — I63231 Cerebral infarction due to unspecified occlusion or stenosis of right carotid arteries: Secondary | ICD-10-CM | POA: Diagnosis not present

## 2019-11-21 DIAGNOSIS — Z20822 Contact with and (suspected) exposure to covid-19: Secondary | ICD-10-CM | POA: Diagnosis not present

## 2019-11-21 DIAGNOSIS — G9341 Metabolic encephalopathy: Secondary | ICD-10-CM | POA: Diagnosis present

## 2019-11-21 DIAGNOSIS — Z923 Personal history of irradiation: Secondary | ICD-10-CM

## 2019-11-21 DIAGNOSIS — M79609 Pain in unspecified limb: Secondary | ICD-10-CM | POA: Diagnosis not present

## 2019-11-21 DIAGNOSIS — I63233 Cerebral infarction due to unspecified occlusion or stenosis of bilateral carotid arteries: Secondary | ICD-10-CM | POA: Diagnosis not present

## 2019-11-21 DIAGNOSIS — E8770 Fluid overload, unspecified: Secondary | ICD-10-CM

## 2019-11-21 DIAGNOSIS — N400 Enlarged prostate without lower urinary tract symptoms: Secondary | ICD-10-CM | POA: Diagnosis not present

## 2019-11-21 DIAGNOSIS — E43 Unspecified severe protein-calorie malnutrition: Secondary | ICD-10-CM | POA: Diagnosis present

## 2019-11-21 DIAGNOSIS — I950 Idiopathic hypotension: Secondary | ICD-10-CM

## 2019-11-21 DIAGNOSIS — I6523 Occlusion and stenosis of bilateral carotid arteries: Secondary | ICD-10-CM | POA: Diagnosis present

## 2019-11-21 DIAGNOSIS — Z8249 Family history of ischemic heart disease and other diseases of the circulatory system: Secondary | ICD-10-CM

## 2019-11-21 DIAGNOSIS — E7800 Pure hypercholesterolemia, unspecified: Secondary | ICD-10-CM | POA: Diagnosis not present

## 2019-11-21 DIAGNOSIS — Z89511 Acquired absence of right leg below knee: Secondary | ICD-10-CM

## 2019-11-21 DIAGNOSIS — Z9889 Other specified postprocedural states: Secondary | ICD-10-CM | POA: Diagnosis not present

## 2019-11-21 DIAGNOSIS — R0600 Dyspnea, unspecified: Secondary | ICD-10-CM

## 2019-11-21 HISTORY — DX: Cerebral infarction, unspecified: I63.9

## 2019-11-21 LAB — RESP PANEL BY RT PCR (RSV, FLU A&B, COVID)
Influenza A by PCR: NEGATIVE
Influenza B by PCR: NEGATIVE
Respiratory Syncytial Virus by PCR: NEGATIVE
SARS Coronavirus 2 by RT PCR: NEGATIVE

## 2019-11-21 LAB — URINALYSIS, ROUTINE W REFLEX MICROSCOPIC
Bilirubin Urine: NEGATIVE
Glucose, UA: 50 mg/dL — AB
Ketones, ur: NEGATIVE mg/dL
Nitrite: NEGATIVE
Protein, ur: 30 mg/dL — AB
RBC / HPF: >50 RBC/hpf — ABNORMAL HIGH (ref 0–5)
Specific Gravity, Urine: 1.017 (ref 1.005–1.030)
pH: 5 (ref 5.0–8.0)

## 2019-11-21 LAB — CBC
HCT: 40.5 % (ref 39.0–52.0)
Hemoglobin: 12.7 g/dL — ABNORMAL LOW (ref 13.0–17.0)
MCH: 27.4 pg (ref 26.0–34.0)
MCHC: 31.4 g/dL (ref 30.0–36.0)
MCV: 87.3 fL (ref 80.0–100.0)
Platelets: 421 10*3/uL — ABNORMAL HIGH (ref 150–400)
RBC: 4.64 MIL/uL (ref 4.22–5.81)
RDW: 16.5 % — ABNORMAL HIGH (ref 11.5–15.5)
WBC: 11.8 10*3/uL — ABNORMAL HIGH (ref 4.0–10.5)
nRBC: 0.2 % (ref 0.0–0.2)

## 2019-11-21 LAB — I-STAT CHEM 8, ED
BUN: 22 mg/dL (ref 8–23)
BUN: 20 mg/dL (ref 8–23)
Calcium, Ion: 1.03 mmol/L — ABNORMAL LOW (ref 1.15–1.40)
Calcium, Ion: 1.17 mmol/L (ref 1.15–1.40)
Chloride: 106 mmol/L (ref 98–111)
Chloride: 103 mmol/L (ref 98–111)
Creatinine, Ser: 0.6 mg/dL — ABNORMAL LOW (ref 0.61–1.24)
Creatinine, Ser: 0.6 mg/dL — ABNORMAL LOW (ref 0.61–1.24)
Glucose, Bld: 122 mg/dL — ABNORMAL HIGH (ref 70–99)
Glucose, Bld: 115 mg/dL — ABNORMAL HIGH (ref 70–99)
HCT: 40 % (ref 39.0–52.0)
HCT: 39 % (ref 39.0–52.0)
Hemoglobin: 13.6 g/dL (ref 13.0–17.0)
Hemoglobin: 13.3 g/dL (ref 13.0–17.0)
Potassium: 4.3 mmol/L (ref 3.5–5.1)
Potassium: 3.9 mmol/L (ref 3.5–5.1)
Sodium: 139 mmol/L (ref 135–145)
Sodium: 138 mmol/L (ref 135–145)
TCO2: 26 mmol/L (ref 22–32)
TCO2: 27 mmol/L (ref 22–32)

## 2019-11-21 LAB — COMPREHENSIVE METABOLIC PANEL
ALT: 94 U/L — ABNORMAL HIGH (ref 0–44)
AST: 71 U/L — ABNORMAL HIGH (ref 15–41)
Albumin: 2.9 g/dL — ABNORMAL LOW (ref 3.5–5.0)
Alkaline Phosphatase: 89 U/L (ref 38–126)
Anion gap: 11 (ref 5–15)
BUN: 18 mg/dL (ref 8–23)
CO2: 21 mmol/L — ABNORMAL LOW (ref 22–32)
Calcium: 8.2 mg/dL — ABNORMAL LOW (ref 8.9–10.3)
Chloride: 99 mmol/L (ref 98–111)
Creatinine, Ser: 1 mg/dL (ref 0.61–1.24)
GFR, Estimated: >60 mL/min (ref >60)
Glucose, Bld: 120 mg/dL — ABNORMAL HIGH (ref 70–99)
Potassium: 8.3 mmol/L (ref 3.5–5.1)
Sodium: 131 mmol/L — ABNORMAL LOW (ref 135–145)
Total Bilirubin: <0.1 mg/dL — ABNORMAL LOW (ref 0.3–1.2)
Total Protein: 6.3 g/dL — ABNORMAL LOW (ref 6.5–8.1)

## 2019-11-21 LAB — DIFFERENTIAL
Abs Immature Granulocytes: 0.09 10*3/uL — ABNORMAL HIGH (ref 0.00–0.07)
Basophils Absolute: 0 10*3/uL (ref 0.0–0.1)
Basophils Relative: 0 %
Eosinophils Absolute: 0 10*3/uL (ref 0.0–0.5)
Eosinophils Relative: 0 %
Immature Granulocytes: 1 %
Lymphocytes Relative: 5 %
Lymphs Abs: 0.5 10*3/uL — ABNORMAL LOW (ref 0.7–4.0)
Monocytes Absolute: 0.7 10*3/uL (ref 0.1–1.0)
Monocytes Relative: 6 %
Neutro Abs: 10.4 10*3/uL — ABNORMAL HIGH (ref 1.7–7.7)
Neutrophils Relative %: 88 %

## 2019-11-21 LAB — CBG MONITORING, ED: Glucose-Capillary: 128 mg/dL — ABNORMAL HIGH (ref 70–99)

## 2019-11-21 LAB — PROTIME-INR
INR: 1.2 (ref 0.8–1.2)
Prothrombin Time: 14.6 seconds (ref 11.4–15.2)

## 2019-11-21 LAB — BRAIN NATRIURETIC PEPTIDE: B Natriuretic Peptide: 75.9 pg/mL (ref 0.0–100.0)

## 2019-11-21 LAB — APTT: aPTT: 28 seconds (ref 24–36)

## 2019-11-21 MED ORDER — IPRATROPIUM-ALBUTEROL 0.5-2.5 (3) MG/3ML IN SOLN: 3.0000 mL | Freq: Four times a day (QID) | RESPIRATORY_TRACT | 0 refills | Status: AC | PRN

## 2019-11-21 MED ORDER — BUDESONIDE 0.5 MG/2ML IN SUSP: 0.5000 mg | Freq: Two times a day (BID) | RESPIRATORY_TRACT | 0 refills | Status: DC

## 2019-11-21 MED ORDER — IOHEXOL 350 MG/ML SOLN
75.0000 mL | Freq: Once | INTRAVENOUS | Status: AC | PRN
  Administered 2019-11-21: 75 mL via INTRAVENOUS

## 2019-11-21 NOTE — ED Notes (Signed)
CRITICAL VALUE ALERT  Critical Value:  K 8.2  Date & Time Notied:  10/20 2040  Provider Notified: R.Lockwood  Orders Received/Actions taken: Repeat iStat chem 8

## 2019-11-21 NOTE — ED Notes (Addendum)
EDP at bedside  

## 2019-11-21 NOTE — ED Notes (Signed)
Patient transported to CT 

## 2019-11-21 NOTE — H&P (Signed)
History and Physical   Evan [name] VQX:[id number] DOB: [birth year] DOA: 11/21/2019  Referring MD/NP/PA: Dr. Vanita Panda  PCP: Patient, No Pcp Per   Outpatient Specialists: Dr. Lorna Few  Patient coming from: Home  Chief Complaint: Confusion  HPI: Evan [name] is a 69 y.o. 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  On arrival potassium was noted to be more than 8.  Patient also had confusion with mild aphasia.  He reported having problem with his gait.  He normally will walk with his walking stick but was unable to do it this morning.  He was feeling lightheaded.  In the ER evaluation with CT and MRI showed new acute left parieto-occipital infarct.  Patient is therefore being admitted to the hospital for evaluation.  Patient does not talk much most of the history is from the daughter this at bedside.  He had no prior strokes.  He has history of hypertension and hyperlipidemia.  Also known to have bilateral carotids artery stenosis.  He felt like he may have had a stroke prior to coming into the hospital apparently.  At this point no focal weakness.  His only weakness is on the left upper extremity which is from last week's admission and has been unchanged.  Patient also has risk factors including tobacco abuse but he quit smoking currently.  Patient also noted for some nystagmus..  ED Course: Temperature is 98 blood pressure 136/93 pulse 106 respirate of 24 oxygen sat 94% room air.  White count 11.8 hemoglobin 421 CO2 21 creatinine 0.61 calcium 8.2.  Sodium 139 potassium 4.3 chloride 106 CO2 122 BUN 20  creatinine 0.6.  Calcium is 1.03.  White count 11.8 hemoglobin 12.7 platelets 421.  Head CT without contrast shows small area of low density in the left parietal lobe compatible with acute to subacute infarction.  MRI of the brain showed acute ischemic infarct involving the left temporal occipital region corresponding with abnormality on prior CT.  This seems to be an acute ischemic infarct.  Minimal petechial hemorrhage in the left temporal occipital infarct no other associated hemorrhage or mass.  Chest x-ray showed no significant new findings.  Patient being admitted for work-up of his acute CVA.  Review of Systems: As per HPI otherwise 10 point review of systems negative.    Past Medical History:  Diagnosis Date  . Anxiety   . Complication of anesthesia    " sometimes I wake up during surgery "  . Depression   . Left renal mass 07/19/2016  . Lung cancer ([facility]) dx'd 2010    NSCL CA right  . Lung cancer ([facility]) dx'd 2017   left    Past Surgical History:  Procedure Laterality Date  . AMPUTATION Right 05/21/2012   Procedure: AMPUTATION BELOW KNEE;  Surgeon: Newt Minion, MD;  Location: [facility];  Service: Orthopedics;  Laterality: Right;  . AMPUTATION Right 06/21/2012   Procedure: Revision AMPUTATION BELOW KNEE Right;  Surgeon: Newt Minion, MD;  Location: [place];  Service: Orthopedics;  Laterality: Right;  Revision right Below Knee Amputation  . COLONOSCOPY W/ BIOPSIES AND POLYPECTOMY    . FOOT AMPUTATION  Right    traumatic right lower extremity  . UPPER JAW     SURGERY FOR INFECTION     reports that he quit smoking about 7 months ago. His smoking use included cigarettes. He has a 22.50 pack-year smoking history. He has never used smokeless tobacco. He reports that he does not drink alcohol and does not use drugs.  Allergies  Allergen Reactions  . Chantix [Varenicline] Other (See Comments)    Weird dreams    Family History  Problem Relation Age of Onset  . Heart disease Mother   .  Diabetes Mother   . Prostate cancer Father      Prior to Admission medications   Medication Sig Start Date End Date Taking? Authorizing Provider  acetaminophen (TYLENOL) 500 MG tablet Take 500 mg by mouth every 6 (six) hours as needed for mild pain, fever or headache.     [provider]  albuterol (VENTOLIN HFA) 108 (90 Base) MCG/ACT inhaler TAKE 2 PUFFS BY MOUTH EVERY 6 HOURS AS NEEDED FOR WHEEZE OR SHORTNESS OF BREATH Patient taking differently: Inhale 2 puffs into the lungs every 6 (six) hours as needed for wheezing or shortness of breath.  08/30/19   Rigoberto Noel, MD  benzonatate (TESSALON) 100 MG capsule Take 1 capsule (100 mg total) by mouth every 8 (eight) hours. 11/09/19   Dorie Rank, MD  budesonide (PULMICORT) 0.5 MG/2ML nebulizer solution Take 2 mLs (0.5 mg total) by nebulization 2 (two) times daily. 11/21/19   Martyn Ehrich, NP  [facility] ASPIRIN EC 325 MG EC tablet TAKE 1 TABLET BY MOUTH EVERY DAY Patient taking differently: Take 325 mg by mouth daily.  08/16/19   Vaslow, Acey Lav, MD  Glycopyrrolate-Formoterol (BEVESPI AEROSPHERE) 9-4.8 MCG/ACT AERO Inhale 2 puffs into the lungs 2 (two) times daily. 06/26/19   Rigoberto Noel, MD  guaiFENesin (MUCINEX) 600 MG 12 hr tablet Take 600 mg by mouth 2 (two) times daily as needed for cough.    [provider]  guaiFENesin (ROBITUSSIN) 100 MG/5ML liquid Take 200 mg by mouth 3 (three) times daily as needed for cough.    [provider]  ipratropium-albuterol (DUONEB) 0.5-2.5 (3) MG/3ML SOLN Take 3 mLs by nebulization every 6 (six) hours as needed. 11/21/19   Martyn Ehrich, NP  nicotine (NICODERM CQ - DOSED IN MG/24 HOURS) 14 mg/24hr patch Place 1 patch (14 mg total) onto the skin daily. For 6 weeks.  Then decrease to 7 mg daily. 07/25/19   Yopp, Amber C, RPH-CPP  nicotine polacrilex (NICORETTE) 4 MG gum Take 1 each (4 mg total) by mouth as needed for smoking cessation. Patient not taking: Reported on 11/15/2019  07/25/19   Yopp, [employer] C, RPH-CPP  predniSONE (DELTASONE) 20 MG tablet Take 2 tablets (40 mg total) by mouth daily with breakfast for 1 day, THEN 1 tablet (20 mg total) daily with breakfast for 3 days. 11/20/19 11/24/19  Hunsucker, Bonna Gains, MD  tamsulosin (FLOMAX) 0.4 MG CAPS capsule Take 0.4 mg by mouth at bedtime. 10/10/19   [provider]    Physical Exam: Vitals:   11/21/19 1819 11/21/19 2056 11/21/19 2128 11/21/19 2130  BP:  (!) 116/93 (!) 117/93 129/85  Pulse:  95 93 (!) 106  Resp:  (!) 24 (!) 21 (!) 22  Temp:      TempSrc:      SpO2:  95% 97% 97%  Weight: 48.1 kg     Height: 5\' 5"  (1.651 m)  Constitutional: Chronically ill looking, weak Vitals:   11/21/19 1819 11/21/19 2056 11/21/19 2128 11/21/19 2130  BP:  (!) 116/93 (!) 117/93 129/85  Pulse:  95 93 (!) 106  Resp:  (!) 24 (!) 21 (!) 22  Temp:      TempSrc:      SpO2:  95% 97% 97%  Weight: 48.1 kg     Height: 5\' 5"  (1.651 m)      Eyes: PERRL, lids and conjunctivae normal ENMT: Mucous membranes are moist. Posterior pharynx clear of any exudate or lesions.Normal dentition.  Neck: normal, supple, no masses, no thyromegaly Respiratory: Rhonchorous breath sounds, mild expiratory wheezing,  normal respiratory effort. No accessory muscle use.  Cardiovascular: Regular rate and rhythm, no murmurs / rubs / gallops. No extremity edema. 2+ pedal pulses. No carotid bruits.  Abdomen: no tenderness, no masses palpated. No hepatosplenomegaly. Bowel sounds positive.  Musculoskeletal: no clubbing / cyanosis. No joint deformity upper and lower extremities. Good ROM, no contractures. Normal muscle tone.  Skin: no rashes, lesions, ulcers. No induration Neurologic: CN 2-12 grossly intact. Sensation intact, DTR normal. Strength 5/5 in all 4.  Slightly confused but moves all limbs equally.  Feels weak in the left upper extremity but power still 5 out of 5 Psychiatric: Confused, no agitation    Labs on Admission: I have  personally reviewed following labs and imaging studies  CBC: Recent Labs  Lab 11/15/19 1243 11/15/19 1243 11/16/19 0357 11/16/19 0357 11/18/19 0453 11/19/19 0431 11/21/19 1855 11/21/19 1905 11/21/19 2115  WBC 7.1  --  6.5  --  7.8 15.1* 11.8*  --   --   NEUTROABS 5.3  --   --   --   --   --  10.4*  --   --   HGB 11.9*   < > 11.0*   < > 10.6* 10.6* 12.7* 13.6 13.3  HCT 37.6*   < > 33.3*   < > 32.3* 32.7* 40.5 40.0 39.0  MCV 84.7  --  83.0  --  82.2 83.8 87.3  --   --   PLT 357  --  378  --  472* 543* 421*  --   --    < > = values in this interval not displayed.   Basic Metabolic Panel: Recent Labs  Lab 11/15/19 1243 11/15/19 1243 11/16/19 0357 11/16/19 0357 11/18/19 0453 11/19/19 0431 11/21/19 1855 11/21/19 1905 11/21/19 2115  NA 138   < > 136   < > 136 138 131* 139 138  K 4.5   < > 3.9   < > 4.1 3.8 8.3* 4.3 3.9  CL 101   < > 104   < > 103 101 99 106 103  CO2 26  --  24  --  23 26 21*  --   --   GLUCOSE 92   < > 105*   < > 166* 161* 120* 122* 115*  BUN 18   < > 18   < > 18 18 18 22 20   CREATININE 0.91   < > 0.74   < > 0.62 0.62 1.00 0.60* 0.60*  CALCIUM 9.2  --  8.6*  --  8.9 8.8* 8.2*  --   --   MG  --   --   --   --  2.2 2.2  --   --   --   PHOS  --   --   --   --  2.1* 2.4*  --   --   --    < > =  values in this interval not displayed.   GFR: Estimated Creatinine Clearance: 60.1 mL/min (A) (by C-G formula based on SCr of 0.6 mg/dL (L)). Liver Function Tests: Recent Labs  Lab 11/15/19 1243 11/21/19 1855  AST 21 71*  ALT 26 94*  ALKPHOS 86 89  BILITOT 0.4 <0.1*  PROT 7.2 6.3*  ALBUMIN 3.4* 2.9*   No results for input(s): LIPASE, AMYLASE in the last 168 hours. No results for input(s): AMMONIA in the last 168 hours. Coagulation Profile: No results for input(s): INR, PROTIME in the last 168 hours. Cardiac Enzymes: No results for input(s): CKTOTAL, CKMB, CKMBINDEX, TROPONINI in the last 168 hours. BNP (last 3 results) No results for input(s): PROBNP in  the last 8760 hours. HbA1C: No results for input(s): HGBA1C in the last 72 hours. CBG: Recent Labs  Lab 11/19/19 2232 11/21/19 1841  GLUCAP 160* 128*   Lipid Profile: No results for input(s): CHOL, HDL, LDLCALC, TRIG, CHOLHDL, LDLDIRECT in the last 72 hours. Thyroid Function Tests: No results for input(s): TSH, T4TOTAL, FREET4, T3FREE, THYROIDAB in the last 72 hours. Anemia Panel: No results for input(s): VITAMINB12, FOLATE, FERRITIN, TIBC, IRON, RETICCTPCT in the last 72 hours. Urine analysis:    Component Value Date/Time   COLORURINE YELLOW 11/21/2019 1841   APPEARANCEUR HAZY (A) 11/21/2019 1841   LABSPEC 1.017 11/21/2019 1841   PHURINE 5.0 11/21/2019 1841   GLUCOSEU 50 (A) 11/21/2019 1841   HGBUR LARGE (A) 11/21/2019 1841   BILIRUBINUR NEGATIVE 11/21/2019 [place] NEGATIVE 11/21/2019 1841   PROTEINUR 30 (A) 11/21/2019 1841   NITRITE NEGATIVE 11/21/2019 1841   LEUKOCYTESUR MODERATE (A) 11/21/2019 1841   Sepsis Labs: @LABRCNTIP (procalcitonin:4,lacticidven:4) ) Recent Results (from the past 240 hour(s))  Expectorated sputum assessment w rflx to resp cult     Status: None   Collection Time: 11/15/19 12:53 PM   Specimen: Sputum  Result Value Ref Range Status   Specimen Description SPUTUM  Final   Special Requests Normal  Final   Sputum evaluation   Final    THIS SPECIMEN IS ACCEPTABLE FOR SPUTUM CULTURE Performed at [facility], [place] [address], [place], [place] [address]    Report Status 11/15/2019 FINAL  Final  Culture, respiratory     Status: None   Collection Time: 11/15/19 12:53 PM   Specimen: SPU  Result Value Ref Range Status   Specimen Description   Final    SPUTUM Performed at [facility] [address], [place], [place] [address]    Special Requests   Final    Normal Reflexed from [id number] Performed at [facility], [place] [address] [place], [place] [address]    Gram Stain   Final    NO  WBC SEEN FEW GRAM POSITIVE COCCI IN PAIRS RARE GRAM VARIABLE ROD    Culture   Final    ABUNDANT Normal respiratory flora-no Staph aureus or Pseudomonas seen Performed at [facility] [address], [place], [place] [address]    Report Status 11/18/2019 FINAL  Final  Culture, Urine     Status: Abnormal   Collection Time: 11/15/19  4:15 PM   Specimen: Urine, Random  Result Value Ref Range Status   Specimen Description   Final    URINE, RANDOM Performed at [facility] [address], [place], [place] [address]    Special Requests   Final    NONE Performed at [facility], [place] [address] [place],  [place] [address]    Culture >=100,000 COLONIES/mL YEAST (A)  Final   Report Status 11/17/2019 FINAL  Final  MRSA PCR Screening     Status: None   Collection Time: 11/15/19  4:15 PM   Specimen: Nasopharyngeal  Result Value Ref Range Status   MRSA by PCR NEGATIVE NEGATIVE Final    Comment:        The GeneXpert MRSA Assay (FDA approved for NASAL specimens only), is one component of a comprehensive MRSA colonization surveillance program. It is not intended to diagnose MRSA infection nor to guide or monitor treatment for MRSA infections. Performed at [facility], [place] [address], [place], [place] [address]   Resp Panel by RT PCR (RSV, Flu A&B, Covid) - Nasopharyngeal Swab     Status: None   Collection Time: 11/21/19  7:08 PM   Specimen: Nasopharyngeal Swab  Result Value Ref Range Status   SARS Coronavirus 2 by RT PCR NEGATIVE NEGATIVE Final    Comment: (NOTE) SARS-CoV-2 target nucleic acids are NOT DETECTED.  The SARS-CoV-2 RNA is generally detectable in upper respiratoy specimens during the acute phase of infection. The lowest concentration of SARS-CoV-2 viral copies this assay can detect is 131 copies/mL. A negative result does not preclude SARS-Cov-2 infection and should not be used as the sole basis for  treatment or other patient management decisions. A negative result may occur with  improper specimen collection/handling, submission of specimen other than nasopharyngeal swab, presence of viral mutation(s) within the areas targeted by this assay, and inadequate number of viral copies (<131 copies/mL). A negative result must be combined with clinical observations, patient history, and epidemiological information. The expected result is Negative.  Fact Sheet for Patients:  PinkCheek.be  Fact Sheet for Healthcare Providers:  GravelBags.it  This test is no t yet approved or cleared by the [place] FDA and  has been authorized for detection and/or diagnosis of SARS-CoV-2 by FDA under an Emergency Use Authorization (EUA). This EUA will remain  in effect (meaning this test can be used) for the duration of the COVID-19 declaration under Section 564(b)(1) of the Act, 21 U.S.C. section 360bbb-3(b)(1), unless the authorization is terminated or revoked sooner.     Influenza A by PCR NEGATIVE NEGATIVE Final   Influenza B by PCR NEGATIVE NEGATIVE Final    Comment: (NOTE) The Xpert Xpress SARS-CoV-2/FLU/RSV assay is intended as an aid in  the diagnosis of influenza from Nasopharyngeal swab specimens and  should not be used as a sole basis for treatment. Nasal washings and  aspirates are unacceptable for Xpert Xpress SARS-CoV-2/FLU/RSV  testing.  Fact Sheet for Patients: PinkCheek.be  Fact Sheet for Healthcare Providers: GravelBags.it  This test is not yet approved or cleared by the [place] FDA and  has been authorized for detection and/or diagnosis of SARS-CoV-2 by  FDA under an Emergency Use Authorization (EUA). This EUA will remain  in effect (meaning this test can be used) for the duration of the  Covid-19 declaration under Section 564(b)(1) of the Act, 21    U.S.C. section 360bbb-3(b)(1), unless the authorization is  terminated or revoked.    Respiratory Syncytial Virus by PCR NEGATIVE NEGATIVE Final    Comment: (NOTE) Fact Sheet for Patients: PinkCheek.be  Fact Sheet for Healthcare Providers: GravelBags.it  This test is not yet approved or cleared by the [place] FDA and  has been authorized for detection and/or diagnosis of SARS-CoV-2 by  FDA under an Emergency Use Authorization (EUA). This EUA  will remain  in effect (meaning this test can be used) for the duration of the  COVID-19 declaration under Section 564(b)(1) of the Act, 21 U.S.C.  section 360bbb-3(b)(1), unless the authorization is terminated or  revoked. Performed at [facility], [place] [address], [place], [place] [address]      Radiological Exams on Admission: CT HEAD WO CONTRAST  Result Date: 11/21/2019 CLINICAL DATA:  Nystagmus.  Left upper arm weakness.  Confusion. EXAM: CT HEAD WITHOUT CONTRAST TECHNIQUE: Contiguous axial images were obtained from the base of the skull through the vertex without intravenous contrast. COMPARISON:  10/28/2019 FINDINGS: Brain: Area of low-density noted in the left parietal region not seen on prior study compatible with acute to subacute infarction. No additional acute intracranial abnormality. No hemorrhage or hydrocephalus. Vascular: No hyperdense vessel or unexpected calcification. Skull: No acute calvarial abnormality. Sinuses/Orbits: No acute findings Other: None IMPRESSION: Small area of low-density in the left parietal lobe compatible with acute to subacute infarction. Electronically Signed   By: Rolm Baptise M.D.   On: 11/21/2019 18:41   MR BRAIN WO CONTRAST  Result Date: 11/21/2019 CLINICAL DATA:  Initial evaluation for neuro deficit, stroke suspected. EXAM: MRI HEAD WITHOUT CONTRAST TECHNIQUE: Multiplanar, multiecho pulse sequences of the brain and  surrounding structures were obtained without intravenous contrast. COMPARISON:  Prior CT from earlier the same day. FINDINGS: Brain: Examination degraded by motion artifact. Cerebral volume within normal limits for age. Single tiny remote left cerebellar infarct noted. Patchy and confluent area of restricted diffusion seen involving the left temporal occipital region, consistent with an acute ischemic infarct (series 5, image 84). Associated minimal petechial hemorrhage without hemorrhagic transformation or significant mass effect. Additional scattered predominantly subcentimeter cortical and subcortical ischemic infarcts seen involving the frontal and parietal lobes bilaterally, somewhat linear in watershed in distribution (series 5, images 98, 92, 89). No associated hemorrhage or mass effect. No infratentorial ischemic changes. No other foci of susceptibility artifact to suggest acute or chronic intracranial hemorrhage. No mass lesion, midline shift or mass effect. No hydrocephalus or extra-axial fluid collection. Pituitary gland suprasellar region within normal limits. Midline structures intact. Vascular: Major intracranial vascular flow voids are grossly maintained. Skull and upper cervical spine: Craniocervical junction within normal limits. Bone marrow signal intensity normal. No scalp soft tissue abnormality. Sinuses/Orbits: Globes and orbital soft tissues within normal limits. Mild mucosal thickening noted within the maxillary sinuses. Paranasal sinuses are otherwise clear. No mastoid effusion. Other: None. IMPRESSION: 1. Acute ischemic infarct involving the left temporoccipital region, corresponding with abnormality on prior CT, and consistent with an acute ischemic infarct. Additional scattered cortical and subcortical infarcts involving the frontal and parietal lobes bilaterally, watershed in distribution. Constellation of findings suggest a recent hypotensive episode/hypoperfusion injury. Associated  minimal petechial hemorrhage at the left temporoccipital infarct. No other associated hemorrhage or mass effect. 2. Underlying tiny remote left cerebellar infarct. Otherwise negative brain MRI for age. Electronically Signed   By: Jeannine Boga M.D.   On: 11/21/2019 20:16   DG Chest Port 1 View  Result Date: 11/21/2019 CLINICAL DATA:  Shortness of breath EXAM: PORTABLE CHEST 1 VIEW COMPARISON:  11/16/2019, CT 11/16/2019, PET CT 03/30/2019 FINDINGS: No acute consolidation or effusion. Bilateral hilar retraction with bilateral upper paramediastinal opacity without change. Stable cardiac size. No pneumothorax IMPRESSION: No active disease. Similar appearance of bilateral hilar retraction and upper paramediastinal opacity. Electronically Signed   By: Donavan Foil M.D.   On: 11/21/2019 20:54    EKG: Independently reviewed.  Shows sinus tachycardia with no significant changes.  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  We will admit the patient to [facility].  Neurology consulted.  Patient will be n.p.o. until he clears swallow evaluation.  He is get MRI of the brain without contrast.  Neurology recommends CT angiogram of the head and neck as well as MRI of the brain with contrast with his known lung cancer to rule out metastatic disease.  Will follow neurology recommendations.  Aspirin and statin if cleared.  Echocardiogram, TSH, B12 and other stroke evaluations  #2 lung cancer: Currently in remission.  #3 COPD: No acute exacerbation but patient sounds very rhonchorous.  Continue home regimen as soon as confirmed.  #4  bilateral carotid artery stenosis: Patient will have bilateral carotid Dopplers.  Will follow results.  #5 essential hypertension: Permissive hypertension for now.  Resume home regimen.   DVT prophylaxis: Lovenox Code Status: Full code Family Communication: Daughter at bedside Disposition Plan: To be determined Consults called: Dr. Curly Sh[place], neurologist Admission status: Inpatient  Severity of Illness: The appropriate patient status for this patient is INPATIENT. Inpatient status is judged to be reasonable and necessary in order to provide the required intensity of service to ensure the patient's safety. The patient's presenting symptoms, physical exam findings, and initial radiographic and laboratory data in the context of their chronic comorbidities is felt to place them at high risk for further clinical deterioration. Furthermore, it is not anticipated that the patient will be medically stable for discharge from the hospital within 2 midnights of admission. The following factors support the patient status of inpatient.   " The patient's presenting symptoms include confusion. " The worrisome physical exam findings include confused. " The initial radiographic and laboratory data are worrisome because of MRI showing CVA. " The chronic co-morbidities include lung cancer and COPD.   * I certify that at the point of admission it is my clinical judgment that the patient will require inpatient hospital care spanning beyond 2 midnights from the point of admission due to high intensity of service, high risk for further deterioration and high frequency of surveillance required.Barbette Merino MD [facility] Pager [phone]  If 7PM-7AM, please contact night-coverage [email or web address] Password [facility]  11/21/2019, 9:56 PM

## 2019-11-21 NOTE — ED Provider Notes (Signed)
[facility] DEPT Provider Note   CSN: [id number] Arrival date & time: 11/21/19  1814     History No chief complaint on file.   Evan [name] is a 69 y.o. male.  HPI    Patient presents with his son who assists with the HPI. The patient has multiple medical issues including stage III lung cancer, COPD, indwelling Foley catheter, and now presents with confusion, left arm discoordination.  Patient's recent history is notable for discharge yesterday following admission for COPD exacerbation.  Slightly longer ago the patient was admitted for urinary retention, and continues to have his Foley catheter in place. Patient typically takes aspirin, but is currently holding this in anticipation of a prostate biopsy scheduled for next week. Son notes that the patient has become progressively weaker, with no focal changes until today.  Today, not long after awakening about 11 hours prior to this evaluation the patient felt confused, disoriented, as though his left arm was not cooperative.  Patient typically uses a cane for stability, as he has a right leg prosthesis, notes increased instability as well. No clear precipitating, alleviating, exacerbating factors. Past Medical History:  Diagnosis Date  . Anxiety   . Complication of anesthesia    " sometimes I wake up during surgery "  . Depression   . Left renal mass 07/19/2016  . Lung cancer ([place]) dx'd 2010    NSCL CA right  . Lung cancer ([place]) dx'd 2017   left    Patient Active Problem List   Diagnosis Date Noted  . Protein-calorie malnutrition, severe 11/17/2019  . Acute exacerbation of chronic obstructive pulmonary disease (COPD) ([place]) 11/15/2019  . Constipation 10/29/2019  . Asymptomatic bacteriuria 10/29/2019  . Pneumonia 10/28/2019  . Primary cancer of left lower lobe of lung ([place]) 04/06/2019  . Involuntary movements 08/24/2018  . Primary cancer of right middle lobe of lung ([place]) 07/16/2018  . Nodule  of middle lobe of right lung 06/20/2018  . Bilateral carotid artery stenosis 02/18/2017  . Left renal mass 07/19/2016  . Phlebitis 02/04/2015  . Full code status 11/04/2014  . Encounter for antineoplastic chemotherapy 11/04/2014  . 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 TOBACCO ABUSE 04/02/2008  . 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 HYPERLIPIDEMIA 02/09/2008  . DEPRESSION 02/09/2008  . Essential hypertension 02/09/2008  . COPD (chronic obstructive pulmonary disease) ([place]) 02/09/2008  . HEADACHE 02/09/2008    Past Surgical History:  Procedure Laterality Date  . AMPUTATION Right 05/21/2012   Procedure: AMPUTATION BELOW KNEE;  Surgeon: Newt Minion, MD;  Location: [facility];  Service: Orthopedics;  Laterality: Right;  . AMPUTATION Right 06/21/2012   Procedure: Revision AMPUTATION BELOW KNEE Right;  Surgeon: Newt Minion, MD;  Location: [facility];  Service: Orthopedics;  Laterality: Right;  Revision right Below Knee Amputation  . COLONOSCOPY W/ BIOPSIES AND POLYPECTOMY    . FOOT AMPUTATION Right    traumatic right lower extremity  . UPPER JAW     SURGERY FOR INFECTION       Family History  Problem Relation Age of Onset  . Heart disease Mother   . Diabetes Mother   . Prostate cancer Father     Social History   Tobacco Use  . Smoking status: Former Smoker    Packs/day: 0.50    Years: 45.00    Pack years: 22.50  Types: Cigarettes    Quit date: 04/02/2019    Years since quitting: 0.6  . Smokeless tobacco: Never Used  Vaping Use  . Vaping Use: Former  Substance Use Topics  . Alcohol use: No    Alcohol/week: 0.0 standard drinks    Comment: rare  . Drug use: No    Home Medications Prior to Admission medications   Medication Sig Start Date End Date Taking? Authorizing Provider  acetaminophen (TYLENOL) 500 MG tablet Take 500 mg by mouth every 6 (six) hours as  needed for mild pain, fever or headache.     [provider]  albuterol (VENTOLIN HFA) 108 (90 Base) MCG/ACT inhaler TAKE 2 PUFFS BY MOUTH EVERY 6 HOURS AS NEEDED FOR WHEEZE OR SHORTNESS OF BREATH Patient taking differently: Inhale 2 puffs into the lungs every 6 (six) hours as needed for wheezing or shortness of breath.  08/30/19   Rigoberto Noel, MD  benzonatate (TESSALON) 100 MG capsule Take 1 capsule (100 mg total) by mouth every 8 (eight) hours. 11/09/19   Dorie Rank, MD  budesonide (PULMICORT) 0.5 MG/2ML nebulizer solution Take 2 mLs (0.5 mg total) by nebulization 2 (two) times daily. 11/21/19   Martyn Ehrich, NP  [employer] ASPIRIN EC 325 MG EC tablet TAKE 1 TABLET BY MOUTH EVERY DAY Patient taking differently: Take 325 mg by mouth daily.  08/16/19   Vaslow, Acey Lav, MD  Glycopyrrolate-Formoterol (BEVESPI AEROSPHERE) 9-4.8 MCG/ACT AERO Inhale 2 puffs into the lungs 2 (two) times daily. 06/26/19   Rigoberto Noel, MD  guaiFENesin (MUCINEX) 600 MG 12 hr tablet Take 600 mg by mouth 2 (two) times daily as needed for cough.    [provider]  guaiFENesin (ROBITUSSIN) 100 MG/5ML liquid Take 200 mg by mouth 3 (three) times daily as needed for cough.    [provider]  ipratropium-albuterol (DUONEB) 0.5-2.5 (3) MG/3ML SOLN Take 3 mLs by nebulization every 6 (six) hours as needed. 11/21/19   Martyn Ehrich, NP  nicotine (NICODERM CQ - DOSED IN MG/24 HOURS) 14 mg/24hr patch Place 1 patch (14 mg total) onto the skin daily. For 6 weeks.  Then decrease to 7 mg daily. 07/25/19   Yopp, Amber C, RPH-CPP  nicotine polacrilex (NICORETTE) 4 MG gum Take 1 each (4 mg total) by mouth as needed for smoking cessation. Patient not taking: Reported on 11/15/2019 07/25/19   Yopp, [employer] C, RPH-CPP  predniSONE (DELTASONE) 20 MG tablet Take 2 tablets (40 mg total) by mouth daily with breakfast for 1 day, THEN 1 tablet (20 mg total) daily with breakfast for 3 days. 11/20/19 11/24/19  Hunsucker,  Bonna Gains, MD  tamsulosin (FLOMAX) 0.4 MG CAPS capsule Take 0.4 mg by mouth at bedtime. 10/10/19   [provider]    Allergies    Chantix [varenicline]  Review of Systems   Review of Systems  Constitutional:       Per HPI, otherwise negative  HENT:       Per HPI, otherwise negative  Respiratory:       Per HPI, otherwise negative  Cardiovascular:       Per HPI, otherwise negative  Gastrointestinal: Negative for vomiting.  Endocrine:       Negative aside from HPI  Genitourinary:       Neg aside from HPI   Musculoskeletal:       Per HPI, otherwise negative  Skin: Negative.   Neurological: Positive for dizziness, weakness and light-headedness. Negative for syncope.  Physical Exam Updated Vital Signs BP (!) 136/93 (BP Location: Right Arm)   Pulse (!) 106   Temp 98 F (36.7 C) (Oral)   Resp 18   Ht 5\' 5"  (1.651 m)   Wt 48.1 kg   SpO2 94%   BMI 17.64 kg/m   Physical Exam Vitals and nursing note reviewed.  Constitutional:      Appearance: He is well-developed.     Comments: Deconditioned adult M speaking w slight delay but no gross dysphasia  HENT:     Head: Normocephalic and atraumatic.  Eyes:     Conjunctiva/sclera: Conjunctivae normal.  Cardiovascular:     Rate and Rhythm: Normal rate and regular rhythm.  Pulmonary:     Effort: Pulmonary effort is normal. No respiratory distress.     Breath sounds: No stridor.  Abdominal:     General: There is no distension.  Musculoskeletal:     Comments: R BKA  Skin:    General: Skin is warm and dry.  Neurological:     Mental Status: He is alert and oriented to person, place, and time.     Cranial Nerves: Dysarthria present.     Motor: Atrophy present.     Comments: R UE strength diminished (4/5) per patient and son.  But, patient has baseline L sided weakness (and right is stronger than left).      ED Results / Procedures / Treatments   Labs (all labs ordered are listed, but only abnormal results are  displayed) Labs Reviewed  CBC - Abnormal; Notable for the following components:      Result Value   WBC 11.8 (*)    Hemoglobin 12.7 (*)    RDW 16.5 (*)    Platelets 421 (*)    All other components within normal limits  DIFFERENTIAL - Abnormal; Notable for the following components:   Neutro Abs 10.4 (*)    Lymphs Abs 0.5 (*)    Abs Immature Granulocytes 0.09 (*)    All other components within normal limits  COMPREHENSIVE METABOLIC PANEL - Abnormal; Notable for the following components:   Sodium 131 (*)    Potassium 8.3 (*)    CO2 21 (*)    Glucose, Bld 120 (*)    Calcium 8.2 (*)    Total Protein 6.3 (*)    Albumin 2.9 (*)    AST 71 (*)    ALT 94 (*)    Total Bilirubin <0.1 (*)    All other components within normal limits  URINALYSIS, ROUTINE W REFLEX MICROSCOPIC - Abnormal; Notable for the following components:   APPearance HAZY (*)    Glucose, UA 50 (*)    Hgb urine dipstick LARGE (*)    Protein, ur 30 (*)    Leukocytes,Ua MODERATE (*)    RBC / HPF >50 (*)    Bacteria, UA RARE (*)    All other components within normal limits  I-STAT CHEM 8, ED - Abnormal; Notable for the following components:   Creatinine, Ser 0.60 (*)    Glucose, Bld 122 (*)    Calcium, Ion 1.03 (*)    All other components within normal limits  CBG MONITORING, ED - Abnormal; Notable for the following components:   Glucose-Capillary 128 (*)    All other components within normal limits  I-STAT CHEM 8, ED - Abnormal; Notable for the following components:   Creatinine, Ser 0.60 (*)    Glucose, Bld 115 (*)    All other components within normal limits  RESP PANEL BY RT PCR (RSV, FLU A&B, COVID)  BRAIN NATRIURETIC PEPTIDE  PROTIME-INR  APTT    EKG EKG Interpretation  Date/Time:  Wednesday November 21 2019 18:29:25 EDT Ventricular Rate:  104 PR Interval:    QRS Duration: 85 QT Interval:  340 QTC Calculation: 448 R Axis:   8 Text Interpretation: Sinus tachycardia Low voltage, extremity leads  Abnormal ECG Confirmed by Carmin Muskrat [phone]) on 11/21/2019 6:48:53 PM   Radiology CT Code Stroke CTA Head W/WO contrast  Addendum Date: 11/21/2019   ADDENDUM REPORT: 11/21/2019 22:59 ADDENDUM: Study discussed by telephone with Dr. Vanita Panda in the ED on 11/21/2019 at 2252 hours. Electronically Signed   By: Genevie Ann M.D.   On: 11/21/2019 22:59   Result Date: 11/21/2019 CLINICAL DATA:  69 year old male with confusion, left upper extremity weakness. Plain head CT raising the possibility of small left parietal infarct. History of lung cancer EXAM: CT ANGIOGRAPHY HEAD AND NECK TECHNIQUE: Multidetector CT imaging of the head and neck was performed using the standard protocol during bolus administration of intravenous contrast. Multiplanar CT image reconstructions and MIPs were obtained to evaluate the vascular anatomy. Carotid stenosis measurements (when applicable) are obtained utilizing NASCET criteria, using the distal internal carotid diameter as the denominator. CONTRAST:  50mL OMNIPAQUE IOHEXOL 350 MG/ML SOLN COMPARISON:  Plain head CT 1650 hours today. CTA head and neck 02/16/2017. Recent CTA chest 11/16/2019. FINDINGS: CTA NECK Skeleton: Stable visualized osseous structures. Degenerative appearing heterogeneous sclerosis in the cervical spine. 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 Continued patchy opacity in the visible superior segment of the left lower lobe (series 6, image 157) from CTA 5 days ago. Other neck: Limited by extensive paravertebral and other bilateral neck venous contrast reflux. No neck mass or lymphadenopathy is evident. Aortic arch: Stable aortic arch since 2019 with mild atherosclerosis. Three vessel arch configuration. Right carotid system: Patent right CCA origin but high-grade right CCA stenosis in the lower neck at the level of of of the thyroid over a segment of about 14  mm, see series 9, image 158. Radiographic string sign stenosis on series 6, image 132 this is chronic but progressed since 2019. The right CCA remains patent. Negative right carotid bifurcation. Right ICA is patent without significant plaque or stenosis. Left carotid system: Patent left CCA origin but similar abrupt radiographic string sign stenosis of the left CCA in the lower neck (series 6, image 135 and series 9, image 148 over a segment of about 15 mm. Despite this the left CCA remains patent. This stenosis has not significantly changed since 2019. Negative left carotid bifurcation.  No cervical left ICA stenosis. Vertebral arteries: Limited cervical vertebral artery detail related to abundant paravertebral venous contrast, especially on the right where the vertebral is non dominant and diminutive. The right vertebral does remain patent to the skull base. Proximal left subclavian artery and left vertebral origin are within normal limits. Tortuous left V1. Dominant left vertebral artery is patent to the skull base without visible stenosis. CTA HEAD Posterior circulation: Dominant left vertebral primarily supplies the basilar. Patent left PICA origin. Diminutive right vertebral artery is patent to the vertebrobasilar junction with patent PICA origin, stable since 2019. Patent basilar artery without stenosis. Patent SCA and PCA origins. Posterior communicating arteries are diminutive or absent. Bilateral PCA branches are within normal limits. Anterior circulation: Both ICA siphons are patent. There is moderate supraclinoid segment atherosclerosis on the left  resulting in mild supraclinoid stenosis. Cavernous and supraclinoid plaque and irregularity on the right also with mild stenosis. Patent carotid termini. Patent MCA and ACA origins. Dominant right ACA A1 as before. Anterior communicating artery and bilateral ACA branches are stable and within normal limits. Left MCA M1 segment and bifurcation are patent  without stenosis. Left MCA branches are stable and within normal limits. Right MCA M1 segment and bifurcation are patent without stenosis. Right MCA branches are stable and within normal limits. Venous sinuses: Intracranial venous structures are patent. Left IJ venous reflux extends into the head as far as the torcula. Left upper extremity contrast injection with extensive paravertebral and other bilateral neck venous contrast reflux. 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 In particular, there is a suspicious pattern of contrast reflux into the left IJ, with the round central filling defect within the vein (series 6, image 100) which seems to abate at the angle of the mandible. Anatomic variants: Dominant left vertebral artery primarily supplies the basilar. Dominant right ACA A1. Review of the MIP images confirms the above findings IMPRESSION: 1. Negative for large vessel occlusion. 2. 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 Bilateral CCA RADIOGRAPHIC STRING SIGNS, stable on the left but progressed on the right since a 2019 CTA. Recommend Vascular Surgery consultation. 3. 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 Left neck Venous Doppler Ultrasound should be confirmatory. 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 4. No carotid bifurcation plaque or stenosis. Bilateral ICA siphon plaque with only mild stenosis. Otherwise intracranial anterior and posterior circulation within normal limits. 5. 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 Continued abnormal opacity in the superior segment of the left lower  lobe from the Chest CTA 5 days ago. Electronically Signed: By: Genevie Ann M.D. On: 11/21/2019 22:42   CT HEAD WO CONTRAST  Result Date: 11/21/2019 CLINICAL DATA:  Nystagmus.  Left upper arm weakness.  Confusion. EXAM: CT HEAD WITHOUT CONTRAST TECHNIQUE: Contiguous axial images were obtained from the base of the skull through the vertex without intravenous contrast. COMPARISON:  10/28/2019 FINDINGS: Brain: Area of low-density noted in the left parietal region not seen on prior study compatible with acute to subacute infarction. No additional acute intracranial abnormality. No hemorrhage or hydrocephalus. Vascular: No hyperdense vessel or unexpected calcification. Skull: No acute calvarial abnormality. Sinuses/Orbits: No acute findings Other: None IMPRESSION: Small area of low-density in the left parietal lobe compatible with acute to subacute infarction. Electronically Signed   By: Rolm Baptise M.D.   On: 11/21/2019 18:41   CT Code Stroke CTA Neck W/WO contrast  Addendum Date: 11/21/2019   ADDENDUM REPORT: 11/21/2019 22:59 ADDENDUM: Study discussed by telephone with Dr. Vanita Panda in the ED on 11/21/2019 at 2252 hours. Electronically Signed   By: Genevie Ann M.D.   On: 11/21/2019 22:59   Result Date: 11/21/2019 CLINICAL DATA:  69 year old male with confusion, left upper extremity weakness. Plain head CT raising the possibility of small left parietal infarct. History of lung cancer EXAM: CT ANGIOGRAPHY HEAD AND NECK TECHNIQUE: Multidetector CT imaging of the head and neck was performed using the standard protocol during bolus administration of intravenous  contrast. Multiplanar CT image reconstructions and MIPs were obtained to evaluate the vascular anatomy. Carotid stenosis measurements (when applicable) are obtained utilizing NASCET criteria, using the distal internal carotid diameter as the denominator. CONTRAST:  68mL OMNIPAQUE IOHEXOL 350 MG/ML SOLN COMPARISON:  Plain head CT 1650 hours today. CTA head and  neck 02/16/2017. Recent CTA chest 11/16/2019. FINDINGS: CTA NECK Skeleton: Stable visualized osseous structures. Degenerative appearing heterogeneous sclerosis in the cervical spine. 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 Continued patchy opacity in the visible superior segment of the left lower lobe (series 6, image 157) from CTA 5 days ago. Other neck: Limited by extensive paravertebral and other bilateral neck venous contrast reflux. No neck mass or lymphadenopathy is evident. Aortic arch: Stable aortic arch since 2019 with mild atherosclerosis. Three vessel arch configuration. Right carotid system: Patent right CCA origin but high-grade right CCA stenosis in the lower neck at the level of of of the thyroid over a segment of about 14 mm, see series 9, image 158. Radiographic string sign stenosis on series 6, image 132 this is chronic but progressed since 2019. The right CCA remains patent. Negative right carotid bifurcation. Right ICA is patent without significant plaque or stenosis. Left carotid system: Patent left CCA origin but similar abrupt radiographic string sign stenosis of the left CCA in the lower neck (series 6, image 135 and series 9, image 148 over a segment of about 15 mm. Despite this the left CCA remains patent. This stenosis has not significantly changed since 2019. Negative left carotid bifurcation.  No cervical left ICA stenosis. Vertebral arteries: Limited cervical vertebral artery detail related to abundant paravertebral venous contrast, especially on the right where the vertebral is non dominant and diminutive. The right vertebral does remain patent to the skull base. Proximal left subclavian artery and left vertebral origin are within normal limits. Tortuous left V1. Dominant left vertebral artery is patent to the skull base without visible stenosis. CTA HEAD Posterior circulation:  Dominant left vertebral primarily supplies the basilar. Patent left PICA origin. Diminutive right vertebral artery is patent to the vertebrobasilar junction with patent PICA origin, stable since 2019. Patent basilar artery without stenosis. Patent SCA and PCA origins. Posterior communicating arteries are diminutive or absent. Bilateral PCA branches are within normal limits. Anterior circulation: Both ICA siphons are patent. There is moderate supraclinoid segment atherosclerosis on the left resulting in mild supraclinoid stenosis. Cavernous and supraclinoid plaque and irregularity on the right also with mild stenosis. Patent carotid termini. Patent MCA and ACA origins. Dominant right ACA A1 as before. Anterior communicating artery and bilateral ACA branches are stable and within normal limits. Left MCA M1 segment and bifurcation are patent without stenosis. Left MCA branches are stable and within normal limits. Right MCA M1 segment and bifurcation are patent without stenosis. Right MCA branches are stable and within normal limits. Venous sinuses: Intracranial venous structures are patent. Left IJ venous reflux extends into the head as far as the torcula. Left upper extremity contrast injection with extensive paravertebral and other bilateral neck venous contrast reflux. 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 In particular, there is a suspicious pattern of contrast reflux into the left IJ, with the round central filling defect within the vein (series 6, image 100) which seems to abate at the angle of the mandible. Anatomic variants: Dominant left vertebral artery primarily supplies the basilar. Dominant right ACA A1. Review of  the MIP images confirms the above findings IMPRESSION: 1. Negative for large vessel occlusion. 2. 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 Bilateral CCA RADIOGRAPHIC STRING SIGNS,  stable on the left but progressed on the right since a 2019 CTA. Recommend Vascular Surgery consultation. 3. 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 Left neck Venous Doppler Ultrasound should be confirmatory. 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 4. No carotid bifurcation plaque or stenosis. Bilateral ICA siphon plaque with only mild stenosis. Otherwise intracranial anterior and posterior circulation within normal limits. 5. 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 Continued abnormal opacity in the superior segment of the left lower lobe from the Chest CTA 5 days ago. Electronically Signed: By: Genevie Ann M.D. On: 11/21/2019 22:42   MR BRAIN WO CONTRAST  Result Date: 11/21/2019 CLINICAL DATA:  Initial evaluation for neuro deficit, stroke suspected. EXAM: MRI HEAD WITHOUT CONTRAST TECHNIQUE: Multiplanar, multiecho pulse sequences of the brain and surrounding structures were obtained without intravenous contrast. COMPARISON:  Prior CT from earlier the same day. FINDINGS: Brain: Examination degraded by motion artifact. Cerebral volume within normal limits for age. Single tiny remote left cerebellar infarct noted. Patchy and confluent area of restricted diffusion seen involving the left temporal occipital region, consistent with an acute ischemic infarct (series 5, image 84). Associated minimal petechial hemorrhage without hemorrhagic transformation or significant mass effect. Additional scattered predominantly subcentimeter cortical and subcortical ischemic infarcts seen involving the frontal and parietal lobes bilaterally, somewhat linear in watershed in distribution (series 5, images 98, 92, 89). No  associated hemorrhage or mass effect. No infratentorial ischemic changes. No other foci of susceptibility artifact to suggest acute or chronic intracranial hemorrhage. No mass lesion, midline shift or mass effect. No hydrocephalus or extra-axial fluid collection. Pituitary gland suprasellar region within normal limits. Midline structures intact. Vascular: Major intracranial vascular flow voids are grossly maintained. Skull and upper cervical spine: Craniocervical junction within normal limits. Bone marrow signal intensity normal. No scalp soft tissue abnormality. Sinuses/Orbits: Globes and orbital soft tissues within normal limits. Mild mucosal thickening noted within the maxillary sinuses. Paranasal sinuses are otherwise clear. No mastoid effusion. Other: None. IMPRESSION: 1. Acute ischemic infarct involving the left temporoccipital region, corresponding with abnormality on prior CT, and consistent with an acute ischemic infarct. Additional scattered cortical and subcortical infarcts involving the frontal and parietal lobes bilaterally, watershed in distribution. Constellation of findings suggest a recent hypotensive episode/hypoperfusion injury. Associated minimal petechial hemorrhage at the left temporoccipital infarct. No other associated hemorrhage or mass effect. 2. Underlying tiny remote left cerebellar infarct. Otherwise negative brain MRI for age. Electronically Signed   By: Jeannine Boga M.D.   On: 11/21/2019 20:16   DG Chest Port 1 View  Result Date: 11/21/2019 CLINICAL DATA:  Shortness of breath EXAM: PORTABLE CHEST 1 VIEW COMPARISON:  11/16/2019, CT 11/16/2019, PET CT 03/30/2019 FINDINGS: No acute consolidation or effusion. Bilateral hilar retraction with bilateral upper paramediastinal opacity without change. Stable cardiac size. No pneumothorax IMPRESSION: No active disease. Similar appearance of bilateral hilar retraction and upper paramediastinal opacity. Electronically Signed   By: Donavan Foil M.D.   On: 11/21/2019 20:54    Procedures Procedures (  including critical care time)  Medications Ordered in ED Medications - No data to display  ED Course  I have reviewed the triage vital signs and the nursing notes.  Pertinent labs & imaging results that were available during my care of the patient were reviewed by me and considered in my medical decision making (see chart for details).   Update: On return from stat CT I again discussed the patient's history with him and his son.  We discussed that he is indeed taking all of his medication since discharge yesterday.  We reviewed the patient's concern for abnormalities and it seems as though his largest concern is right upper extremity weakness and dysarthria as well as mild confusion.  On exam, amendment as above.  I reviewed the patient's CT scan, agree with interpretation.  8:14 PM I reviewed the patient's MRI, including imaging showing abnormality consistent with a left parietal lesion/stroke.  Update: I discussed the patient's case with our neurology colleagues, and again with our internal medicine colleagues.  I will concern for stroke he will require transfer.  After discussing his history with neurology, the patient has additional studies ordered.  Update: I discussed the patient's studies with the radiologist via telephone, and he will require additional evaluation tomorrow for his stenosis, bilateral, carotids.  Adult male with substantial medical history, now 1 day after discharge from this facility presents with new dysarthria, right upper extremity weakness that began greater than 6 hours prior to ED arrival.  Patient's evaluation here, imaging, all consistent with stroke.  Patient required transfer to our stroke center for appropriate monitoring, management. MDM Rules/Calculators/A&P MDM Number of Diagnoses or Management Options Acute stroke due to ischemia [facility]): new, needed workup   Amount and/or Complexity of  Data Reviewed Clinical lab tests: reviewed Tests in the radiology section of CPT: reviewed Tests in the medicine section of CPT: reviewed Discussion of test results with the performing providers: yes Decide to obtain previous medical records or to obtain history from someone other than the patient: yes Obtain history from someone other than the patient: yes Review and summarize past medical records: yes Discuss the patient with other providers: yes Independent visualization of images, tracings, or specimens: yes  Risk of Complications, Morbidity, and/or Mortality Presenting problems: high Diagnostic procedures: high Management options: high  Critical Care Total time providing critical care: 30-74 minutes (35)  Patient Progress Patient progress: stable  Final Clinical Impression(s) / ED Diagnoses Final diagnoses:  Acute stroke due to ischemia [facility])     Carmin Muskrat, MD 11/21/19 2341

## 2019-11-21 NOTE — ED Triage Notes (Signed)
Pt states that at 7-8am this morning he has felt confused and disoriented and feels "as if he wants to do something but can't" with his L arm. Pt was recently discharged from hospital with PNA and is being treated for COPD now. Pt also has indwelling foley catheter for urinary retention. Alert. Oriented.

## 2019-11-21 NOTE — Telephone Encounter (Signed)
Called and spoke with pharmacy and they do not have these 2 medications on file. RX has been resent. Nothing further needed at this time.

## 2019-11-22 ENCOUNTER — Inpatient Hospital Stay (HOSPITAL_COMMUNITY): Payer: Medicare PPO

## 2019-11-22 ENCOUNTER — Inpatient Hospital Stay (HOSPITAL_COMMUNITY)
Admit: 2019-11-22 | Discharge: 2019-11-22 | Disposition: A | Discharge status: Home / Self Care | Payer: Medicare PPO | Attending: Neurology | Admitting: Neurology

## 2019-11-22 ENCOUNTER — Other Ambulatory Visit: Payer: Self-pay

## 2019-11-22 DIAGNOSIS — I6523 Occlusion and stenosis of bilateral carotid arteries: Secondary | ICD-10-CM

## 2019-11-22 DIAGNOSIS — I6389 Other cerebral infarction: Secondary | ICD-10-CM | POA: Diagnosis not present

## 2019-11-22 DIAGNOSIS — M79609 Pain in unspecified limb: Secondary | ICD-10-CM | POA: Diagnosis not present

## 2019-11-22 DIAGNOSIS — R451 Restlessness and agitation: Secondary | ICD-10-CM

## 2019-11-22 DIAGNOSIS — M7989 Other specified soft tissue disorders: Secondary | ICD-10-CM | POA: Diagnosis not present

## 2019-11-22 DIAGNOSIS — I10 Essential (primary) hypertension: Secondary | ICD-10-CM | POA: Diagnosis not present

## 2019-11-22 DIAGNOSIS — I639 Cerebral infarction, unspecified: Secondary | ICD-10-CM | POA: Diagnosis not present

## 2019-11-22 DIAGNOSIS — R609 Edema, unspecified: Secondary | ICD-10-CM

## 2019-11-22 LAB — LIPID PANEL
Cholesterol: 232 mg/dL — ABNORMAL HIGH (ref 0–200)
HDL: 67 mg/dL (ref >40)
LDL Cholesterol: 154 mg/dL — ABNORMAL HIGH (ref 0–99)
Total CHOL/HDL Ratio: 3.5 RATIO
Triglycerides: 54 mg/dL (ref <150)
VLDL: 11 mg/dL (ref 0–40)

## 2019-11-22 LAB — BLOOD GAS, ARTERIAL
Acid-Base Excess: 0.6 mmol/L (ref 0.0–2.0)
Bicarbonate: 24.1 mmol/L (ref 20.0–28.0)
FIO2: 32
O2 Saturation: 97 %
Patient temperature: 37
pCO2 arterial: 34.6 mmHg (ref 32.0–48.0)
pH, Arterial: 7.456 — ABNORMAL HIGH (ref 7.350–7.450)
pO2, Arterial: 91.3 mmHg (ref 83.0–108.0)

## 2019-11-22 LAB — ECHOCARDIOGRAM COMPLETE
Area-P 1/2: 2.37 cm2
Height: 65 in
S' Lateral: 2.1 cm
Weight: 1696 oz

## 2019-11-22 LAB — CBC
HCT: 32.7 % — ABNORMAL LOW (ref 39.0–52.0)
Hemoglobin: 10.8 g/dL — ABNORMAL LOW (ref 13.0–17.0)
MCH: 27.9 pg (ref 26.0–34.0)
MCHC: 33 g/dL (ref 30.0–36.0)
MCV: 84.5 fL (ref 80.0–100.0)
Platelets: 397 10*3/uL (ref 150–400)
RBC: 3.87 MIL/uL — ABNORMAL LOW (ref 4.22–5.81)
RDW: 16 % — ABNORMAL HIGH (ref 11.5–15.5)
WBC: 10.2 10*3/uL (ref 4.0–10.5)
nRBC: 0 % (ref 0.0–0.2)

## 2019-11-22 LAB — CREATININE, SERUM
Creatinine, Ser: 0.52 mg/dL — ABNORMAL LOW (ref 0.61–1.24)
GFR, Estimated: >60 mL/min (ref >60)

## 2019-11-22 LAB — CBG MONITORING, ED: Glucose-Capillary: 92 mg/dL (ref 70–99)

## 2019-11-22 MED ORDER — NICOTINE 14 MG/24HR TD PT24
14.0000 mg | MEDICATED_PATCH | Freq: Every day | TRANSDERMAL | Status: DC
  Administered 2019-11-22 – 2019-12-16 (×24): 14 mg via TRANSDERMAL
  Filled 2019-11-22 (×24): qty 1

## 2019-11-22 MED ORDER — HALOPERIDOL LACTATE 5 MG/ML IJ SOLN
5.0000 mg | Freq: Once | INTRAMUSCULAR | Status: AC
  Administered 2019-11-22: 5 mg via INTRAVENOUS

## 2019-11-22 MED ORDER — LORAZEPAM 2 MG/ML IJ SOLN
1.0000 mg | Freq: Once | INTRAMUSCULAR | Status: DC
  Filled 2019-11-22: qty 1

## 2019-11-22 MED ORDER — IPRATROPIUM-ALBUTEROL 0.5-2.5 (3) MG/3ML IN SOLN
3.0000 mL | RESPIRATORY_TRACT | Status: DC | PRN
  Administered 2019-11-23 – 2019-11-28 (×8): 3 mL via RESPIRATORY_TRACT
  Filled 2019-11-22 (×8): qty 3

## 2019-11-22 MED ORDER — IPRATROPIUM-ALBUTEROL 0.5-2.5 (3) MG/3ML IN SOLN
3.0000 mL | Freq: Four times a day (QID) | RESPIRATORY_TRACT | Status: DC | PRN
  Administered 2019-11-22 (×2): 3 mL via RESPIRATORY_TRACT
  Filled 2019-11-22 (×2): qty 3

## 2019-11-22 MED ORDER — HALOPERIDOL LACTATE 5 MG/ML IJ SOLN
INTRAMUSCULAR | Status: AC
  Filled 2019-11-22: qty 1

## 2019-11-22 MED ORDER — GUAIFENESIN ER 600 MG PO TB12
600.0000 mg | ORAL_TABLET | Freq: Two times a day (BID) | ORAL | Status: DC
  Administered 2019-11-22 – 2019-12-12 (×38): 600 mg via ORAL
  Filled 2019-11-22 (×41): qty 1

## 2019-11-22 MED ORDER — SODIUM CHLORIDE 0.9 % IV SOLN: INTRAVENOUS | Status: DC

## 2019-11-22 MED ORDER — GLYCOPYRROLATE 1 MG PO TABS
1.0000 mg | ORAL_TABLET | Freq: Two times a day (BID) | ORAL | Status: DC
  Filled 2019-11-22 (×2): qty 1

## 2019-11-22 MED ORDER — ALBUMIN HUMAN 5 % IV SOLN
12.5000 g | Freq: Four times a day (QID) | INTRAVENOUS | Status: DC
  Administered 2019-11-22: 12.5 g via INTRAVENOUS
  Filled 2019-11-22 (×6): qty 250

## 2019-11-22 MED ORDER — BUDESONIDE 0.5 MG/2ML IN SUSP
0.5000 mg | Freq: Two times a day (BID) | RESPIRATORY_TRACT | Status: DC
  Administered 2019-11-22 – 2019-11-25 (×8): 0.5 mg via RESPIRATORY_TRACT
  Filled 2019-11-22 (×10): qty 2

## 2019-11-22 MED ORDER — GADOBUTROL 1 MMOL/ML IV SOLN
5.0000 mL | Freq: Once | INTRAVENOUS | Status: AC | PRN
  Administered 2019-11-22: 5 mL via INTRAVENOUS

## 2019-11-22 MED ORDER — ATORVASTATIN CALCIUM 80 MG PO TABS
80.0000 mg | ORAL_TABLET | Freq: Every day | ORAL | Status: DC
  Administered 2019-11-22 – 2019-12-12 (×20): 80 mg via ORAL
  Filled 2019-11-22 (×9): qty 1
  Filled 2019-11-22: qty 2
  Filled 2019-11-22 (×12): qty 1

## 2019-11-22 MED ORDER — STROKE: EARLY STAGES OF RECOVERY BOOK
Freq: Once | Status: DC
  Filled 2019-11-22 (×4): qty 1

## 2019-11-22 MED ORDER — TICAGRELOR 90 MG PO TABS
180.0000 mg | ORAL_TABLET | Freq: Once | ORAL | Status: AC
  Administered 2019-11-22: 180 mg via ORAL
  Filled 2019-11-22: qty 2

## 2019-11-22 MED ORDER — SENNOSIDES-DOCUSATE SODIUM 8.6-50 MG PO TABS
1.0000 | ORAL_TABLET | Freq: Every evening | ORAL | Status: DC | PRN
  Filled 2019-11-22: qty 1

## 2019-11-22 MED ORDER — ACETAMINOPHEN 650 MG RE SUPP: 650.0000 mg | RECTAL | Status: DC | PRN

## 2019-11-22 MED ORDER — TAMSULOSIN HCL 0.4 MG PO CAPS
0.4000 mg | ORAL_CAPSULE | Freq: Every day | ORAL | Status: DC
  Administered 2019-11-23: 0.4 mg via ORAL
  Filled 2019-11-22 (×2): qty 1

## 2019-11-22 MED ORDER — ACETAMINOPHEN 160 MG/5ML PO SOLN
650.0000 mg | ORAL | Status: DC | PRN
  Administered 2019-12-06: 650 mg
  Filled 2019-11-22 (×2): qty 20.3

## 2019-11-22 MED ORDER — SODIUM CHLORIDE 0.9 % IV BOLUS
500.0000 mL | Freq: Once | INTRAVENOUS | Status: AC
  Administered 2019-11-22: 500 mL via INTRAVENOUS

## 2019-11-22 MED ORDER — ACETAMINOPHEN 325 MG PO TABS
650.0000 mg | ORAL_TABLET | ORAL | Status: DC | PRN
  Administered 2019-11-25 – 2019-12-12 (×9): 650 mg via ORAL
  Filled 2019-11-22 (×9): qty 2

## 2019-11-22 MED ORDER — ENOXAPARIN SODIUM 40 MG/0.4ML ~~LOC~~ SOLN
40.0000 mg | SUBCUTANEOUS | Status: DC
  Administered 2019-11-22 – 2019-12-03 (×11): 40 mg via SUBCUTANEOUS
  Filled 2019-11-22 (×12): qty 0.4

## 2019-11-22 MED ORDER — CHLORHEXIDINE GLUCONATE CLOTH 2 % EX PADS
6.0000 | MEDICATED_PAD | Freq: Every day | CUTANEOUS | Status: DC
  Administered 2019-11-24 – 2019-12-16 (×19): 6 via TOPICAL

## 2019-11-22 MED ORDER — ASPIRIN 325 MG PO TABS
325.0000 mg | ORAL_TABLET | Freq: Every day | ORAL | Status: DC
  Administered 2019-11-22: 325 mg via ORAL
  Filled 2019-11-22: qty 1

## 2019-11-22 MED ORDER — GLYCOPYRROLATE 0.2 MG/ML IJ SOLN
0.4000 mg | Freq: Three times a day (TID) | INTRAMUSCULAR | Status: DC
  Administered 2019-11-23 – 2019-11-24 (×7): 0.4 mg via INTRAVENOUS
  Filled 2019-11-22 (×7): qty 2

## 2019-11-22 MED ORDER — TICAGRELOR 90 MG PO TABS
90.0000 mg | ORAL_TABLET | Freq: Two times a day (BID) | ORAL | Status: DC
  Administered 2019-11-23 – 2019-11-26 (×7): 90 mg via ORAL
  Filled 2019-11-22 (×9): qty 1

## 2019-11-22 MED ORDER — LORAZEPAM 2 MG/ML IJ SOLN
0.5000 mg | Freq: Once | INTRAMUSCULAR | Status: AC
  Administered 2019-11-22: 0.5 mg via INTRAVENOUS

## 2019-11-22 MED ORDER — LORAZEPAM 2 MG/ML IJ SOLN
INTRAMUSCULAR | Status: AC
  Filled 2019-11-22: qty 1

## 2019-11-22 MED ORDER — ASPIRIN EC 81 MG PO TBEC
81.0000 mg | DELAYED_RELEASE_TABLET | Freq: Every day | ORAL | Status: DC
  Administered 2019-11-23 – 2019-12-05 (×13): 81 mg via ORAL
  Filled 2019-11-22 (×14): qty 1

## 2019-11-22 NOTE — Progress Notes (Signed)
°  Echocardiogram 2D Echocardiogram has been performed.  Evan [name] 11/22/2019, 4:35 PM

## 2019-11-22 NOTE — Consult Note (Signed)
Neurology Consultation Reason for Consult: Stroke on MRI Referring Physician: Shelly Coss  CC: Generalized weakness  History is obtained from: Patient and daughter at bedside as well as chart review   HPI: Evan [name] is a 69 y.o. 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  He was only recently discharged from the hospital on 10/19 after admission for exacerbation of his COPD.  He noted that he was having problems moving around the house, in particular with spatial awareness and vision guided movements.  He was also having generalized weakness.  He does use a walker at baseline.  The symptoms were associated with "feeling disorganized and confused" and his sleep has also been poor.  Recently his son and daughter have moved in to help provide supervision for him given his declining health.  On review of systems he reports a minor headache in the center/top of his head that feels like pressure and started around the time of his discharge.  He also reports chronic swallowing issues for a year, weight loss with poor appetite, cannot tell me if he feels dizzy as he states he did not pay attention to that, has noticed some left-sided weakness, and denies any bowel or bladder incontinence that has had worsening retention requiring a chronic Foley catheter for the past month.  As he was preparing for a prostate biopsy he had stopped his aspirin as instructed for this procedure.  He additionally notes some constipation and although he denies stomach pain his daughter reports that he has intermittently complained of this to her.  LKW: 10/19 at that time tPA given?: No, due to out of the window   Past Medical History:  Diagnosis Date  . Anxiety   . Complication of anesthesia    " sometimes I wake up  during surgery "  . Depression   . Left renal mass 07/19/2016  . Lung cancer ([place]) dx'd 2010    NSCL CA right  . Lung cancer ([place]) dx'd 2017   left   Past Surgical History:  Procedure Laterality Date  . AMPUTATION Right 05/21/2012   Procedure: AMPUTATION BELOW KNEE;  Surgeon: Newt Minion, MD;  Location: [place];  Service: Orthopedics;  Laterality: Right;  . AMPUTATION Right 06/21/2012   Procedure: Revision AMPUTATION BELOW KNEE Right;  Surgeon: Newt Minion, MD;  Location: [place];  Service: Orthopedics;  Laterality: Right;  Revision right Below Knee Amputation  . COLONOSCOPY W/ BIOPSIES AND POLYPECTOMY    . FOOT AMPUTATION Right    traumatic right lower extremity  . UPPER JAW     SURGERY FOR INFECTION    Family History  Problem Relation Age of Onset  . Heart disease Mother   . Diabetes Mother   . Prostate cancer Father    Current Outpatient Medications  Medication Instructions  . acetaminophen (TYLENOL) 500 mg, Oral, Every 6 hours PRN  . albuterol (VENTOLIN HFA) 108 (90 Base) MCG/ACT inhaler TAKE 2 PUFFS BY MOUTH EVERY 6 HOURS AS NEEDED FOR WHEEZE OR SHORTNESS OF BREATH  . benzonatate (TESSALON) 100 mg, Oral, Every 8 hours  . budesonide (PULMICORT) 0.5 mg, Nebulization, 2 times daily  . [employer] ASPIRIN EC 325 MG EC tablet TAKE 1 TABLET BY MOUTH EVERY DAY  . Glycopyrrolate-Formoterol (BEVESPI AEROSPHERE) 9-4.8 MCG/ACT AERO 2 puffs, Inhalation, 2 times daily  .  guaiFENesin (MUCINEX) 600 mg, Oral, 2 times daily PRN  . guaiFENesin (ROBITUSSIN) 200 mg, Oral, 3 times daily PRN  . ipratropium-albuterol (DUONEB) 0.5-2.5 (3) MG/3ML SOLN 3 mLs, Nebulization, Every 6 hours PRN  . nicotine (NICODERM CQ - DOSED IN MG/24 HOURS) 14 mg, Transdermal, Daily, For 6 weeks.  Then decrease to 7 mg daily.  . nicotine polacrilex (NICORETTE) 4 mg, Oral, As needed  . predniSONE (DELTASONE) 20 MG tablet Take 2 tablets (40 mg total) by mouth daily with breakfast for 1 day, THEN 1 tablet (20 mg total) daily with  breakfast for 3 days.  . tamsulosin (FLOMAX) 0.4 mg, Oral, Daily at bedtime     Social History:  reports that he quit smoking about 7 months ago. His smoking use included cigarettes. He has a 22.50 pack-year smoking history. He has never used smokeless tobacco. He reports that he does not drink alcohol and does not use drugs.   Exam: Current vital signs: BP (!) 148/96   Pulse 93   Temp 98 F (36.7 C) (Oral)   Resp 19   Ht $R'5\' 5"'Jq$  (1.651 m)   Wt 48.1 kg   SpO2 95%   BMI 17.64 kg/m  Vital signs in last 24 hours: Temp:  [98 F (36.7 C)] 98 F (36.7 C) (10/21 0105) Pulse Rate:  [92-106] 93 (10/21 0155) Resp:  [18-32] 19 (10/21 0155) BP: (109-150)/(79-101) 148/96 (10/21 0155) SpO2:  [72 %-100 %] 95 % (10/21 0155) Weight:  [48.1 kg] 48.1 kg (10/20 1819)   Physical Exam  Constitutional: Appears well-developed and well-nourished.  Psych: Affect appropriate to situation, somewhat flat  Eyes: No scleral injection HENT: No OP obstruction; temporal wasting MSK: right BKA Cardiovascular: Normal rate and regular rhythm.  Respiratory: Effort normal, wet sounding breathing GI: Soft.  No distension. There is no tenderness.  Skin: WDI  Neuro: Mental Status: Patient is awake, alert, oriented to person, place, month, year, and situation. Patient is able to give a clear and coherent history. 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 Pupils are equal, round, and reactive to light; does have some light sensitivity III,IV, VI: EOMI without ptosis or diploplia.  V: Facial sensation is reduced on the left V1 distribution slightly  VII: Facial movement is symmetric.  VIII: hearing is intact to voice X: Uvula elevates symmetrically XI: Shoulder shrug is symmetric. XII: tongue is midline without atrophy or fasciculations.  Motor: Tone is normal. Bulk is notable for diffuse atrophy. 5/5 strength was present in all four  extremities other than a 3/5 wrist extension and 4/5 grip strength on the LUE Sensory: Sensation is symmetric to light touch and temperature in the arms and legs. Deep Tendon Reflexes: 3+ and symmetric in the biceps and patellae. Plantars: Toes are downgoing on the left foot  Cerebellar: He is much more easily able to smoothly touch his nose with his eyes closed than when his eyes are open   I have reviewed labs in epic and the results pertinent to this consultation are: Cr 0.52  Lab Results  Component Value Date   CHOL 232 (H) 11/22/2019   HDL 67 11/22/2019   LDLCALC 154 (H) 11/22/2019   TRIG 54 11/22/2019   CHOLHDL 3.5 11/22/2019     I have reviewed the images obtained: HCT w/ left parietal infarct MRI brain with watershed infarcts and area of restricted diffusion in the left parietooccipital lobe, most likely stroke but possibly  met  CTA with bilateral carotid string signs as well as c/f L IJ thrombus  Impression: 69 year old man with a past medical history significant for small cell lung cancer (stage IIIa status post chemo and radiation), traumatic right knee BKA, recent recurrent pneumonia, left renal mass, COPD, found to have watershed infarcts, severe bilateral carotid stenosis, and a likely right IJ thrombus.  While his symptoms are generally nonspecific, generalized weakness is consistent with watershed infarcts from symptomatic carotid stenosis and vascular surgery consult is indicated.  I did discuss with the family that it is unclear whether he will be a good surgical candidate at this time given his significant current comorbidities.  Also, given his significant weight loss, lack of appetite etc., would like to evaluate for the possibility of metastatic malignancy in the brain with contrasted MRI  Recommendations:  # Watershed strokes - Transfer to [facility] - Stroke labs TSH, HgbA1c, fasting lipid panel - MRI brain completed as above;  - CTA completed as above -  Frequent neuro checks - Echocardiogram - Carotid dopplers to confirm cartoid stenosis on CTA - Aspirin - dose 343m daily - Adjust statin with goal LDL less than 70 - Consider addition of Plavix if AC not started  - Risk factor modification - Telemetry monitoring; 30 day event monitor on discharge if no arrythmias captured  - Blood pressure goal   - Permissive hypertension to 220/120 due to severe bilateral carotid stensoses, symptomatic given strokes - PT consult, OT consult, Speech consult, - Vascular surgery consult - Stroke team to follow  # Left parieto occipital lesion - MRI brain with contrast to rule out underlying malignancy / mass  # Concern for Left IJ clot - No prior line placement in the neck per patient/family, confirm in records - Neck ultrasound to confirm - [facility] strokes[facility]  SLesleigh NoeMD-PhD [facility] [phone]

## 2019-11-22 NOTE — ED Notes (Signed)
[facility] called for transport. 

## 2019-11-22 NOTE — ED Notes (Signed)
This RN was notified by pt daughter that he was experiencing weakness on his left side. This RN found pt to have L arm weakness with no ability to hold against gravity. Pt able to feel sensation on both sides and has full movement of his right side. Pt is currently complaining of headache.

## 2019-11-22 NOTE — ED Notes (Signed)
Per MD Tawanna Solo, please page neurology team.  NP Miachel Roux on Neurology care team paged.

## 2019-11-22 NOTE — Significant Event (Signed)
Rapid Response Event Note   Reason for Call :  Respiratory distress  Initial Focused Assessment:  Patient in respiratory distress.  Increased Work of Breathing.  Rhonchi through out.  BP 148/106 ST 128  RR 30 O2 sats 97% on RA He has received 0.5 Ativan He is restless  Interventions:  NTS, cream secretions Duoneb given PCXR done Placed on Bipap  Patient immediately relaxed and work of breathing improved on Bipap   Plan of Care:  Change level of care to progressive care RN to call if patient has additional distress   Event Summary:   MD Notified: 1900 Dr Eliseo Squires Call Time: 1829 Arrival Time: 1834 End Time: Pilar Plate, RN

## 2019-11-22 NOTE — Progress Notes (Signed)
Patient was seen for agitation.   Evan [name] presented yesterday with weakness and confusion, was found to have acute CVA and admitted with plan for cerebral angiogram in the morning. He developed acute respiratory distress and agitation earlier this evening, had CXR with no acute findings, and improved briefly with Ativan and BiPAP but now agitated and not allowing care.   On exam, he is mildly tachypneic with some accessory muscle use, no pallor or diaphoresis. He is agitated, yelling, removing monitoring equipment and BiPAP. Course rhonchi on auscultation.   Plan to give him a dose of Haldol now, resume BiPAP as tolerated, keep NPO due to suspicion for aspiration, continue close monitoring.

## 2019-11-22 NOTE — Progress Notes (Addendum)
STROKE TEAM PROGRESS NOTE   INTERVAL HISTORY Son at bedside. Pt lying in bed.  Still has secretions with weak cough.  Patient and son recounted HPI with me.  Patient had recently admitted for pneumonia and COPD exacerbation.  Also found to have urinary retention.  Yesterday at home found to have generalized weakness, confusion, was sent to ER for evaluation.  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  CTA head and neck showed bilateral CCA high-grade stenosis likely due to history of radiation.  This morning, left arm flaccid ED. No other neuro changes. Will lay flat, give fluid bolus and add albumin to IVF following.  Discussed with Dr. Estanislado Pandy, plan for stenting of right CTA first.  Put on aspirin and Brilinta.  Vitals:   11/22/19 1100 11/22/19 1145 11/22/19 1200 11/22/19 1230  BP: (!) 149/97 (!) 153/75 128/81 121/79  Pulse: 100 89 93 96  Resp: (!) 22 (!) 29 (!) 25 (!) 26  Temp:      TempSrc:      SpO2: 95% 94% 95% 97%  Weight:      Height:       CBC:  Recent Labs  Lab 11/21/19 1855 11/21/19 1905 11/21/19 2115 11/22/19 0347  WBC 11.8*  --   --  10.2  NEUTROABS 10.4*  --   --   --   HGB 12.7*   < > 13.3 10.8*  HCT 40.5   < > 39.0 32.7*  MCV 87.3  --   --  84.5  PLT 421*  --   --  397   < > = values in this interval not displayed.   Basic Metabolic Panel:  Recent Labs  Lab 11/18/19 0453 11/18/19 0453 11/19/19 0431 11/19/19 0431 11/21/19 1855 11/21/19 1855 11/21/19 1905 11/21/19 1905 11/21/19 2115 11/22/19 0347  NA 136   < > 138   < > 131*   < > 139  --  138  --   K 4.1   < > 3.8   < > 8.3*   < > 4.3  --  3.9  --   CL 103   < > 101   < > 99   < > 106  --  103  --   CO2 23   < > 26  --  21*  --   --   --   --   --   GLUCOSE 166*   < > 161*   < > 120*   < > 122*  --  115*  --   BUN 18   < > 18   < > 18   < > 22  --  20  --   CREATININE 0.62   < > 0.62   < > 1.00   < > 0.60*   < > 0.60* 0.52*  CALCIUM 8.9   < > 8.8*  --  8.2*  --   --   --    --   --   MG 2.2  --  2.2  --   --   --   --   --   --   --   PHOS 2.1*  --  2.4*  --   --   --   --   --   --   --    < > = values in this interval not displayed.   Lipid Panel:  Recent Labs  Lab 11/22/19 0347  CHOL 232*  TRIG  54  HDL 67  CHOLHDL 3.5  VLDL 11  LDLCALC 154*   HgbA1c: No results for input(s): HGBA1C in the last 168 hours. Urine Drug Screen: No results for input(s): LABOPIA, COCAINSCRNUR, LABBENZ, AMPHETMU, THCU, LABBARB in the last 168 hours.  Alcohol Level No results for input(s): ETH in the last 168 hours.  IMAGING past 24 hours CT Code Stroke CTA Head W/WO contrast  Addendum Date: 11/21/2019   ADDENDUM REPORT: 11/21/2019 22:59 ADDENDUM: Study discussed by telephone with Dr. Vanita Panda in the ED on 11/21/2019 at 2252 hours. Electronically Signed   By: Genevie Ann M.D.   On: 11/21/2019 22:59   Result Date: 11/21/2019 CLINICAL DATA:  69 year old male with confusion, left upper extremity weakness. Plain head CT raising the possibility of small left parietal infarct. History of lung cancer EXAM: CT ANGIOGRAPHY HEAD AND NECK TECHNIQUE: Multidetector CT imaging of the head and neck was performed using the standard protocol during bolus administration of intravenous contrast. Multiplanar CT image reconstructions and MIPs were obtained to evaluate the vascular anatomy. Carotid stenosis measurements (when applicable) are obtained utilizing NASCET criteria, using the distal internal carotid diameter as the denominator. CONTRAST:  69mL OMNIPAQUE IOHEXOL 350 MG/ML SOLN COMPARISON:  Plain head CT 1650 hours today. CTA head and neck 02/16/2017. Recent CTA chest 11/16/2019. FINDINGS: CTA NECK Skeleton: Stable visualized osseous structures. Degenerative appearing heterogeneous sclerosis in the cervical spine. 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 Continued patchy opacity in the visible  superior segment of the left lower lobe (series 6, image 157) from CTA 5 days ago. Other neck: Limited by extensive paravertebral and other bilateral neck venous contrast reflux. No neck mass or lymphadenopathy is evident. Aortic arch: Stable aortic arch since 2019 with mild atherosclerosis. Three vessel arch configuration. Right carotid system: Patent right CCA origin but high-grade right CCA stenosis in the lower neck at the level of of of the thyroid over a segment of about 14 mm, see series 9, image 158. Radiographic string sign stenosis on series 6, image 132 this is chronic but progressed since 2019. The right CCA remains patent. Negative right carotid bifurcation. Right ICA is patent without significant plaque or stenosis. Left carotid system: Patent left CCA origin but similar abrupt radiographic string sign stenosis of the left CCA in the lower neck (series 6, image 135 and series 9, image 148 over a segment of about 15 mm. Despite this the left CCA remains patent. This stenosis has not significantly changed since 2019. Negative left carotid bifurcation.  No cervical left ICA stenosis. Vertebral arteries: Limited cervical vertebral artery detail related to abundant paravertebral venous contrast, especially on the right where the vertebral is non dominant and diminutive. The right vertebral does remain patent to the skull base. Proximal left subclavian artery and left vertebral origin are within normal limits. Tortuous left V1. Dominant left vertebral artery is patent to the skull base without visible stenosis. CTA HEAD Posterior circulation: Dominant left vertebral primarily supplies the basilar. Patent left PICA origin. Diminutive right vertebral artery is patent to the vertebrobasilar junction with patent PICA origin, stable since 2019. Patent basilar artery without stenosis. Patent SCA and PCA origins. Posterior communicating arteries are diminutive or absent. Bilateral PCA branches are within normal  limits. Anterior circulation: Both ICA siphons are patent. There is moderate supraclinoid segment atherosclerosis on the left resulting in mild supraclinoid stenosis. Cavernous and supraclinoid plaque and irregularity on the right also with  mild stenosis. Patent carotid termini. Patent MCA and ACA origins. Dominant right ACA A1 as before. Anterior communicating artery and bilateral ACA branches are stable and within normal limits. Left MCA M1 segment and bifurcation are patent without stenosis. Left MCA branches are stable and within normal limits. Right MCA M1 segment and bifurcation are patent without stenosis. Right MCA branches are stable and within normal limits. Venous sinuses: Intracranial venous structures are patent. Left IJ venous reflux extends into the head as far as the torcula. Left upper extremity contrast injection with extensive paravertebral and other bilateral neck venous contrast reflux. 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 In particular, there is a suspicious pattern of contrast reflux into the left IJ, with the round central filling defect within the vein (series 6, image 100) which seems to abate at the angle of the mandible. Anatomic variants: Dominant left vertebral artery primarily supplies the basilar. Dominant right ACA A1. Review of the MIP images confirms the above findings IMPRESSION: 1. Negative for large vessel occlusion. 2. 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 Bilateral CCA RADIOGRAPHIC STRING SIGNS, stable on the left but progressed on the right since a 2019 CTA. Recommend Vascular Surgery consultation. 3. 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 Left neck Venous Doppler Ultrasound should be confirmatory. 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 4. No carotid bifurcation plaque or stenosis. Bilateral ICA siphon plaque with only mild stenosis. Otherwise intracranial anterior and posterior circulation within normal limits. 5. 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 Continued abnormal opacity in the superior segment of the left lower lobe from the Chest CTA 5 days ago. Electronically Signed: By: Genevie Ann M.D. On: 11/21/2019 22:42   CT HEAD WO CONTRAST  Result Date: 11/21/2019 CLINICAL DATA:  Nystagmus.  Left upper arm weakness.  Confusion. EXAM: CT HEAD WITHOUT CONTRAST TECHNIQUE: Contiguous axial images were obtained from the base of the skull through the vertex without intravenous contrast. COMPARISON:  10/28/2019 FINDINGS: Brain: Area of low-density noted in the left parietal region not seen on prior study compatible with acute to subacute infarction. No additional acute intracranial abnormality. No hemorrhage or hydrocephalus. Vascular: No hyperdense vessel or unexpected calcification. Skull: No acute calvarial abnormality. Sinuses/Orbits: No acute findings Other: None IMPRESSION: Small area of low-density in the left parietal lobe compatible with acute to subacute infarction. Electronically Signed   By: Rolm Baptise M.D.   On: 11/21/2019 18:41   CT Code Stroke CTA Neck W/WO contrast  Addendum Date: 11/21/2019   ADDENDUM REPORT: 11/21/2019 22:59 ADDENDUM: Study discussed by telephone with Dr. Vanita Panda in the ED on 11/21/2019 at 2252 hours. Electronically Signed   By: Genevie Ann M.D.   On: 11/21/2019 22:59   Result Date: 11/21/2019 CLINICAL DATA:  69 year old male with confusion, left upper extremity weakness. Plain head CT raising the possibility of small left parietal infarct. History of lung cancer EXAM: CT ANGIOGRAPHY HEAD AND NECK TECHNIQUE: Multidetector CT  imaging of the head and neck was performed using the standard protocol during bolus administration of intravenous contrast. Multiplanar CT image reconstructions and MIPs were obtained to evaluate the vascular anatomy. Carotid stenosis  measurements (when applicable) are obtained utilizing NASCET criteria, using the distal internal carotid diameter as the denominator. CONTRAST:  79mL OMNIPAQUE IOHEXOL 350 MG/ML SOLN COMPARISON:  Plain head CT 1650 hours today. CTA head and neck 02/16/2017. Recent CTA chest 11/16/2019. FINDINGS: CTA NECK Skeleton: Stable visualized osseous structures. Degenerative appearing heterogeneous sclerosis in the cervical spine. 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 Continued patchy opacity in the visible superior segment of the left lower lobe (series 6, image 157) from CTA 5 days ago. Other neck: Limited by extensive paravertebral and other bilateral neck venous contrast reflux. No neck mass or lymphadenopathy is evident. Aortic arch: Stable aortic arch since 2019 with mild atherosclerosis. Three vessel arch configuration. Right carotid system: Patent right CCA origin but high-grade right CCA stenosis in the lower neck at the level of of of the thyroid over a segment of about 14 mm, see series 9, image 158. Radiographic string sign stenosis on series 6, image 132 this is chronic but progressed since 2019. The right CCA remains patent. Negative right carotid bifurcation. Right ICA is patent without significant plaque or stenosis. Left carotid system: Patent left CCA origin but similar abrupt radiographic string sign stenosis of the left CCA in the lower neck (series 6, image 135 and series 9, image 148 over a segment of about 15 mm. Despite this the left CCA remains patent. This stenosis has not significantly changed since 2019. Negative left carotid bifurcation.  No cervical left ICA  stenosis. Vertebral arteries: Limited cervical vertebral artery detail related to abundant paravertebral venous contrast, especially on the right where the vertebral is non dominant and diminutive. The right vertebral does remain patent to the skull base. Proximal left subclavian artery and left vertebral origin are within normal limits. Tortuous left V1. Dominant left vertebral artery is patent to the skull base without visible stenosis. CTA HEAD Posterior circulation: Dominant left vertebral primarily supplies the basilar. Patent left PICA origin. Diminutive right vertebral artery is patent to the vertebrobasilar junction with patent PICA origin, stable since 2019. Patent basilar artery without stenosis. Patent SCA and PCA origins. Posterior communicating arteries are diminutive or absent. Bilateral PCA branches are within normal limits. Anterior circulation: Both ICA siphons are patent. There is moderate supraclinoid segment atherosclerosis on the left resulting in mild supraclinoid stenosis. Cavernous and supraclinoid plaque and irregularity on the right also with mild stenosis. Patent carotid termini. Patent MCA and ACA origins. Dominant right ACA A1 as before. Anterior communicating artery and bilateral ACA branches are stable and within normal limits. Left MCA M1 segment and bifurcation are patent without stenosis. Left MCA branches are stable and within normal limits. Right MCA M1 segment and bifurcation are patent without stenosis. Right MCA branches are stable and within normal limits. Venous sinuses: Intracranial venous structures are patent. Left IJ venous reflux extends into the head as far as the torcula. Left upper extremity contrast injection with extensive paravertebral and other bilateral neck venous contrast reflux. 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 In particular, there is a suspicious pattern of contrast reflux into the left IJ, with the round central filling  defect within the vein (series 6, image 100) which seems to abate at the angle of the mandible. Anatomic variants: Dominant left vertebral artery primarily supplies the basilar. Dominant right ACA A1. Review of the MIP images confirms the above findings IMPRESSION: 1. Negative for large vessel occlusion. 2. 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 Bilateral CCA RADIOGRAPHIC STRING SIGNS, stable on the left but progressed on the right since a 2019 CTA. Recommend Vascular Surgery consultation. 3. 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 Left neck Venous Doppler Ultrasound should be confirmatory. 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 4. No carotid bifurcation plaque or stenosis. Bilateral ICA siphon plaque with only mild stenosis. Otherwise intracranial anterior and posterior circulation within normal limits. 5. 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 Continued abnormal opacity in the superior segment of the left lower lobe from the Chest CTA 5 days ago. Electronically Signed: By: Genevie Ann M.D. On: 11/21/2019 22:42   MR ANGIO HEAD WO CONTRAST  Result Date: 11/22/2019 CLINICAL DATA:  Stroke, follow-up. Brain mass or lesion, history of small cell lung cancer, rule out metastases. EXAM: MRI HEAD WITH CONTRAST MRA HEAD WITHOUT CONTRAST TECHNIQUE: Multiplanar, multiecho pulse sequences of the brain and surrounding structures were obtained without and with intravenous contrast. Angiographic images of the head were obtained using MRA technique without contrast. CONTRAST:  47mL GADAVIST GADOBUTROL 1 MMOL/ML IV SOLN COMPARISON:   Noncontrast head CT 11/21/2019, noncontrast brain MRI 11/21/2019, CT angiogram head/neck 11/21/2019. Brain MRI 08/29/2018. FINDINGS: MRI HEAD FINDINGS The examination is markedly motion degraded with severe or moderate/severe motion degradation of all post-contrast sequences. This precludes adequate evaluation for intracranial metastatic disease. There is predominantly gyriform enhancement within the left temporal occipital lobes (series 9, image 8). No other definite sites of abnormal intracranial enhancement are identified. MRA HEAD FINDINGS The examination is moderate to severely motion degraded. This precludes adequate evaluation for intracranial arterial stenoses and limits evaluation for aneurysms. No intracranial large vessel occlusion is identified. Faint flow related signal is seen arising from the intracranial right vertebral artery. In correlating with the prior CTA of 11/21/2019, this vessel is developmentally diminutive. IMPRESSION: MRI brain: 1. Markedly motion degraded examination with severe or moderate/severe motion degradation of all acquired post-contrast sequences. This precludes adequate evaluation for intracranial metastatic disease, and a repeat examination is recommended when the patient is better able to tolerate study. 2. Predominantly gyriform enhancement within the left temporal occipital lobes. This may be related to the known acute/early subacute infarct at this site. However, attention is recommended on follow-up. 3. No other definite sites of abnormal intracranial enhancement are identified. MRA head: 1. Moderate to severely motion degraded examination, precluding adequate evaluation for intracranial arterial stenoses and limiting evaluation for aneurysms. 2. No intracranial large vessel occlusion is identified. Electronically Signed   By: Kellie Simmering DO   On: 11/22/2019 07:59   MR BRAIN WO CONTRAST  Result Date: 11/21/2019 CLINICAL DATA:  Initial evaluation for neuro deficit,  stroke suspected. EXAM: MRI HEAD WITHOUT CONTRAST TECHNIQUE: Multiplanar, multiecho pulse sequences of the brain and surrounding structures were obtained without intravenous contrast. COMPARISON:  Prior CT from earlier the same day. FINDINGS: Brain: Examination degraded by motion artifact. Cerebral volume within normal limits for age. Single tiny remote left cerebellar infarct noted. Patchy and confluent area of restricted diffusion seen involving the left temporal occipital region, consistent with an acute ischemic infarct (series 5, image 84). Associated minimal petechial hemorrhage without hemorrhagic  transformation or significant mass effect. Additional scattered predominantly subcentimeter cortical and subcortical ischemic infarcts seen involving the frontal and parietal lobes bilaterally, somewhat linear in watershed in distribution (series 5, images 98, 92, 89). No associated hemorrhage or mass effect. No infratentorial ischemic changes. No other foci of susceptibility artifact to suggest acute or chronic intracranial hemorrhage. No mass lesion, midline shift or mass effect. No hydrocephalus or extra-axial fluid collection. Pituitary gland suprasellar region within normal limits. Midline structures intact. Vascular: Major intracranial vascular flow voids are grossly maintained. Skull and upper cervical spine: Craniocervical junction within normal limits. Bone marrow signal intensity normal. No scalp soft tissue abnormality. Sinuses/Orbits: Globes and orbital soft tissues within normal limits. Mild mucosal thickening noted within the maxillary sinuses. Paranasal sinuses are otherwise clear. No mastoid effusion. Other: None. IMPRESSION: 1. Acute ischemic infarct involving the left temporoccipital region, corresponding with abnormality on prior CT, and consistent with an acute ischemic infarct. Additional scattered cortical and subcortical infarcts involving the frontal and parietal lobes bilaterally, watershed  in distribution. Constellation of findings suggest a recent hypotensive episode/hypoperfusion injury. Associated minimal petechial hemorrhage at the left temporoccipital infarct. No other associated hemorrhage or mass effect. 2. Underlying tiny remote left cerebellar infarct. Otherwise negative brain MRI for age. Electronically Signed   By: Jeannine Boga M.D.   On: 11/21/2019 20:16   MR BRAIN W CONTRAST  Result Date: 11/22/2019 CLINICAL DATA:  Stroke, follow-up. Brain mass or lesion, history of small cell lung cancer, rule out metastases. EXAM: MRI HEAD WITH CONTRAST MRA HEAD WITHOUT CONTRAST TECHNIQUE: Multiplanar, multiecho pulse sequences of the brain and surrounding structures were obtained without and with intravenous contrast. Angiographic images of the head were obtained using MRA technique without contrast. CONTRAST:  54mL GADAVIST GADOBUTROL 1 MMOL/ML IV SOLN COMPARISON:  Noncontrast head CT 11/21/2019, noncontrast brain MRI 11/21/2019, CT angiogram head/neck 11/21/2019. Brain MRI 08/29/2018. FINDINGS: MRI HEAD FINDINGS The examination is markedly motion degraded with severe or moderate/severe motion degradation of all post-contrast sequences. This precludes adequate evaluation for intracranial metastatic disease. There is predominantly gyriform enhancement within the left temporal occipital lobes (series 9, image 8). No other definite sites of abnormal intracranial enhancement are identified. MRA HEAD FINDINGS The examination is moderate to severely motion degraded. This precludes adequate evaluation for intracranial arterial stenoses and limits evaluation for aneurysms. No intracranial large vessel occlusion is identified. Faint flow related signal is seen arising from the intracranial right vertebral artery. In correlating with the prior CTA of 11/21/2019, this vessel is developmentally diminutive. IMPRESSION: MRI brain: 1. Markedly motion degraded examination with severe or moderate/severe  motion degradation of all acquired post-contrast sequences. This precludes adequate evaluation for intracranial metastatic disease, and a repeat examination is recommended when the patient is better able to tolerate study. 2. Predominantly gyriform enhancement within the left temporal occipital lobes. This may be related to the known acute/early subacute infarct at this site. However, attention is recommended on follow-up. 3. No other definite sites of abnormal intracranial enhancement are identified. MRA head: 1. Moderate to severely motion degraded examination, precluding adequate evaluation for intracranial arterial stenoses and limiting evaluation for aneurysms. 2. No intracranial large vessel occlusion is identified. Electronically Signed   By: Kellie Simmering DO   On: 11/22/2019 07:59   DG Chest Port 1 View  Result Date: 11/21/2019 CLINICAL DATA:  Shortness of breath EXAM: PORTABLE CHEST 1 VIEW COMPARISON:  11/16/2019, CT 11/16/2019, PET CT 03/30/2019 FINDINGS: No acute consolidation or effusion. Bilateral hilar retraction with bilateral upper  paramediastinal opacity without change. Stable cardiac size. No pneumothorax IMPRESSION: No active disease. Similar appearance of bilateral hilar retraction and upper paramediastinal opacity. Electronically Signed   By: Donavan Foil M.D.   On: 11/21/2019 20:54   VAS [place] UPPER EXTREMITY VENOUS DUPLEX  Result Date: 11/22/2019 UPPER VENOUS STUDY  Indications: Swelling, and Pain Risk Factors: Cancer Lung. Performing Technologist: Griffin Basil RCT RDMS  Examination Guidelines: A complete evaluation includes B-mode imaging, spectral Doppler, color Doppler, and power Doppler as needed of all accessible portions of each vessel. Bilateral testing is considered an integral part of a complete examination. Limited examinations for reoccurring indications may be performed as noted.  Left Findings: +----------+------------+---------+-----------+----------+-------+ LEFT       CompressiblePhasicitySpontaneousPropertiesSummary +----------+------------+---------+-----------+----------+-------+ IJV           None       No        No                       +----------+------------+---------+-----------+----------+-------+ Subclavian    None       No        No                       +----------+------------+---------+-----------+----------+-------+ Axillary      None       No        No                       +----------+------------+---------+-----------+----------+-------+ Brachial    Partial      Yes       Yes                      +----------+------------+---------+-----------+----------+-------+ Radial        Full                                          +----------+------------+---------+-----------+----------+-------+ Ulnar         Full                                          +----------+------------+---------+-----------+----------+-------+ Cephalic      Full                                          +----------+------------+---------+-----------+----------+-------+ Basilic       Full                                          +----------+------------+---------+-----------+----------+-------+  Summary:  Right: No evidence of thrombosis in the subclavian.  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 Previous Cat Scan Yesterday.  *See table(s) above for measurements and observations.    Preliminary     PHYSICAL EXAM  Temp:  [97.3 F (36.3 C)-98.3 F (36.8 C)] 97.3 F (36.3 C) (10/21 2018) Pulse Rate:  [88-128] 112 (10/21 2018) Resp:  [17-32] 26 (10/21 2018) BP: (97-166)/(70-108) 97/80 (10/21 2018) SpO2:  [  72 %-100 %] 100 % (10/21 2018) FiO2 (%):  [32 %] 32 % (10/21 1948)  General - Well nourished, well developed, in no apparent distress.  Ophthalmologic - fundi not visualized due to  noncooperation.  Cardiovascular - Regular rhythm and rate.  Respiratory - b/l Rhonchi, increased secretion with weak cough.  Neuro - awake alert, orientated x3.  No aphasia, mild dysarthria, secretion hard to clear due to weak cough, able to name and repeat.  Follows simple commands.  No gaze preference, visual field fall.  No significant facial droop, tongue midline.  Left upper extremity 3 -/5 deltoid, 3/5 bicep, 2/5 tricep and finger grip.  Right upper extremity at least 4/5.  Bilateral lower extremity at least 4/5 with right BKA.  Sensation symmetrical.  Finger-to-nose intact on the right.  Gait not tested.  ASSESSMENT/PLAN Mr. TOMMASO CAVITT is a 69 y.o. male with history of recurrent non small cell lung cancer (stage IIIa status post chemo and radiation), traumatic right knee BKA, recent recurrent pneumonia, left renal mass, known prior bilateral carotid artery stenosis, COPD w/ recent exacerbation d/c 10/19. He has had generalized weakness and poor sleep, chronic swallowing, wt loss with poor apptetite and his son and dtr recently moved in to help him. Plans for prostate bx for which he had stopped his aspirin. On 10/20 presented to [facility] ED with difficulty w/ spatial awareness and vision guided movements, HA, L sided weakness. Transfered to [employer]   Stroke: Left temporooccipital infarct and R>L watershed infarcts in setting of known B CCA stenosis secondary to radiation  CT head small L parietal hypodensity  MRI  L temporooccipital infarct. Additional scattered cortical + subcortical B watershed infarcts. Min petechial hemorrhage in L temporooccipital infarct. Old tiny L cerebellar infarct  CTA head & neck no LVO. 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 suspicion for L IJ thrombosis. B ICA siphon w/ mild stenosis. LLL opacity.   MRA head  Unremarkable   Cerebral angio pending with intent to treat  2D Echo EF 55 to 60%  LDL 154    HgbA1c pending   VTE prophylaxis - Lovenox 40 mg sq daily   off aspirin for planned prostate bx prior to admission, now on aspirin 81 mg daily and brilinta 90 bid.   Therapy recommendations:  pending   Disposition:  pending   Carotid Stenosis   CTA neck 02/2017 Long segment of severe 80% right and severe 90% left proximal  common carotid artery stenosis.  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  Cerebral angio pending with intent to treat   Now on ASA and brilinta   L parietooccipital lesion  MRI w/ severely degraded, enhancement L temporooccipital lobe, repeat recommended  Likely due to infarction  MRI with contrast nondiagnostic - motion due to SOB lying flat  Will repeat MRI with contrast once tolerable to rule out brain metastasis  L IJ thrombosis  No prior line placement in neck per family, history of for Port-A-Cath on right chest per patient  CTA neck in 2019 also suspicious for left IJ thrombosis, however not for certain - discussed with Dr. 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 Permissive hypertension (OK if < 220/120) but gradually normalize in 5-7 days . Long-term BP goal 130-150 give carotid stenosis   Hyperlipidemia  Home meds:  No statin  Now on lipitor 80  LDL 154, goal < 70  Continue statin at discharge  Non Small cell lung CA  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    recurrence on the left side presented as a stage IIIa non-small cell lung cancer, squamous cell carcinoma status post concurrent chemoradiation followed by consolidation chemotherapy. Completed in March 2017.  He also underwent SBRT to the right middle lobe lung nodule under the care of Dr. [name] completed on 07/24/2018.  He also underwent SBRT to left lower lobe lung nodule under the care of Dr. [name] completed on April 27, 2019.  Recent scan showed no concerning findings for disease progression  Followed with Dr. Julien Nordmann  Other Stroke Risk Factors  Advanced age  Cigarette smoker, quit 7 mos ago, advised to stop smoking  Other Active Problems  Recent COPD exacerbation, d/c 10/19  Recent recurrent PNA  Hematuria with urinary retention  Marked enlargement of the Prostate w/ plans for bx    Cachexia, Body mass index is 17.64 kg/m.   Traumatic left BKA  Hospital day # 1  I spent more than 35 minutes in total face-to-face time with the patient, more than 50% of which was spent in counseling and coordination of care, reviewing test results, images and medication, and discussing the diagnosis, treatment plan and potential prognosis. This patient's care requiresreview of multiple databases, neurological assessment, discussion with family, other specialists and medical decision making of high complexity.  I have discussed with Dr. Estanislado Pandy, Dr. Tawanna Solo and vascular lab technologist. I had long discussion with son at bedside, updated pt current condition, treatment plan and potential prognosis, and answered all the questions.  He expressed understanding and appreciation.    Rosalin Hawking, MD PhD Stroke Neurology 11/22/2019 9:13 PM  To contact Stroke Continuity provider, please refer to [email or web address] After hours, contact General Neurology

## 2019-11-22 NOTE — ED Notes (Signed)
Admitting provider paged via [place].  Waiting for call

## 2019-11-22 NOTE — Consult Note (Signed)
Chief Complaint: Patient was seen in consultation today for stroke evaluation.   Referring Physician(s): Dr. Erlinda Hong  Supervising Physician: Luanne Bras  Patient Status: [facility] - In-pt  History of Present Illness: Evan [name] is a 69 y.o. male with a medical history significant for stage III lung cancer s/p chemotherapy and currently in remission. He also has a history of recurrent pneumonia, depression/anxiety and COPD for which he was recently hospitalized. He presented to the [facility] ED 11/21/19 with confusion, weakness, mild aphasia, difficulty ambulating and left-sided weakness. He was admitted for acute CVA and transferred to [facility] for further work up. CT imaging showed a small area of low density in the left parietal lobe compatible with acute to subacute infarction. MRI of the brain confirmed findings of acute ischemic infarct involving the left temporaloccipital region. 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  Interventional Radiology has been asked to evaluate this patient for an image-guided diagnostic cerebral angiogram for further work up and evaluation. This case has been reviewed and procedure approved by Dr. Estanislado Pandy.   Past Medical History:  Diagnosis Date  . Anxiety   . Complication of anesthesia    " sometimes I wake up during surgery "  . Depression   . Left renal mass 07/19/2016  . Lung cancer ([place]) dx'd 2010    NSCL CA right  . Lung cancer ([place]) dx'd 2017   left    Past Surgical History:  Procedure Laterality Date  . AMPUTATION Right 05/21/2012   Procedure: AMPUTATION BELOW KNEE;  Surgeon: Newt Minion, MD;  Location: [place];  Service: Orthopedics;  Laterality: Right;  . AMPUTATION Right 06/21/2012   Procedure: Revision AMPUTATION BELOW KNEE Right;  Surgeon: Newt Minion, MD;  Location: [place];  Service:  Orthopedics;  Laterality: Right;  Revision right Below Knee Amputation  . COLONOSCOPY W/ BIOPSIES AND POLYPECTOMY    . FOOT AMPUTATION Right    traumatic right lower extremity  . UPPER JAW     SURGERY FOR INFECTION    Allergies: Chantix [varenicline]  Medications: Prior to Admission medications   Medication Sig Start Date End Date Taking? Authorizing Provider  acetaminophen (TYLENOL) 500 MG tablet Take 500 mg by mouth every 6 (six) hours as needed for mild pain, fever or headache.    Yes [provider]  albuterol (VENTOLIN HFA) 108 (90 Base) MCG/ACT inhaler TAKE 2 PUFFS BY MOUTH EVERY 6 HOURS AS NEEDED FOR WHEEZE OR SHORTNESS OF BREATH Patient taking differently: Inhale 2 puffs into the lungs every 6 (six) hours as needed for wheezing or shortness of breath.  08/30/19  Yes Rigoberto Noel, MD  benzonatate (TESSALON) 100 MG capsule Take 1 capsule (100 mg total) by mouth every 8 (eight) hours. 11/09/19  Yes Dorie Rank, MD  Glycopyrrolate-Formoterol (BEVESPI AEROSPHERE) 9-4.8 MCG/ACT AERO Inhale 2 puffs into the lungs 2 (two) times daily. 06/26/19  Yes Rigoberto Noel, MD  guaiFENesin (MUCINEX) 600 MG 12 hr tablet Take 600 mg by mouth 2 (two) times daily as needed for cough.   Yes [provider]  guaiFENesin (ROBITUSSIN) 100 MG/5ML liquid Take 200 mg by mouth 3 (three) times daily as needed for cough.   Yes [provider]  nicotine (NICODERM CQ - DOSED IN MG/24 HOURS) 14 mg/24hr patch Place 1 patch (14 mg total) onto the skin daily. For  6 weeks.  Then decrease to 7 mg daily. 07/25/19  Yes Yopp, Amber C, RPH-CPP  predniSONE (DELTASONE) 20 MG tablet Take 2 tablets (40 mg total) by mouth daily with breakfast for 1 day, THEN 1 tablet (20 mg total) daily with breakfast for 3 days. 11/20/19 11/24/19 Yes Hunsucker, Bonna Gains, MD  tamsulosin (FLOMAX) 0.4 MG CAPS capsule Take 0.4 mg by mouth at bedtime. 10/10/19  Yes [provider]  budesonide (PULMICORT) 0.5 MG/2ML  nebulizer solution Take 2 mLs (0.5 mg total) by nebulization 2 (two) times daily. 11/21/19   Martyn Ehrich, NP  [employer] ASPIRIN EC 325 MG EC tablet TAKE 1 TABLET BY MOUTH EVERY DAY Patient taking differently: Take 325 mg by mouth daily.  08/16/19   Vaslow, Acey Lav, MD  ipratropium-albuterol (DUONEB) 0.5-2.5 (3) MG/3ML SOLN Take 3 mLs by nebulization every 6 (six) hours as needed. Patient taking differently: Take 3 mLs by nebulization every 6 (six) hours as needed (sob).  11/21/19   Martyn Ehrich, NP  nicotine polacrilex (NICORETTE) 4 MG gum Take 1 each (4 mg total) by mouth as needed for smoking cessation. Patient not taking: Reported on 11/15/2019 07/25/19   Yopp, [employer] C, RPH-CPP     Family History  Problem Relation Age of Onset  . Heart disease Mother   . Diabetes Mother   . Prostate cancer Father     Social History   Socioeconomic History  . Marital status: Single    Spouse name: Not on file  . Number of children: Not on file  . Years of education: Not on file  . Highest education level: Not on file  Occupational History  . Not on file  Tobacco Use  . Smoking status: Former Smoker    Packs/day: 0.50    Years: 45.00    Pack years: 22.50    Types: Cigarettes    Quit date: 04/02/2019    Years since quitting: 0.6  . Smokeless tobacco: Never Used  Vaping Use  . Vaping Use: Former  Substance and Sexual Activity  . Alcohol use: No    Alcohol/week: 0.0 standard drinks    Comment: rare  . Drug use: No  . Sexual activity: Not Currently  Other Topics Concern  . Not on file  Social History Narrative  . Not on file   Social Determinants of Health   Financial Resource Strain:   . Difficulty of Paying Living Expenses: Not on file  Food Insecurity:   . Worried About Charity fundraiser in the Last Year: Not on file  . Ran Out of Food in the Last Year: Not on file  Transportation Needs:   . Lack of Transportation (Medical): Not on file  . Lack of Transportation  (Non-Medical): Not on file  Physical Activity:   . Days of Exercise per Week: Not on file  . Minutes of Exercise per Session: Not on file  Stress:   . Feeling of Stress : Not on file  Social Connections:   . Frequency of Communication with Friends and Family: Not on file  . Frequency of Social Gatherings with Friends and Family: Not on file  . Attends Religious Services: Not on file  . Active Member of Clubs or Organizations: Not on file  . Attends Archivist Meetings: Not on file  . Marital Status: Not on file    Review of Systems: A 12 point ROS discussed and pertinent positives are indicated in the HPI above.  All other  systems are negative.  Review of Systems  Constitutional: Positive for fatigue.  Respiratory: Positive for shortness of breath.   Cardiovascular: Negative for chest pain and leg swelling.  Gastrointestinal: Negative for abdominal pain, diarrhea, nausea and vomiting.  Musculoskeletal: Negative for back pain.  Neurological: Positive for weakness.    Vital Signs: BP 117/76 (BP Location: Left Arm)   Pulse 95   Temp 98 F (36.7 C) (Oral)   Resp 20   Ht 5\' 5"  (1.651 m)   Wt 106 lb (48.1 kg)   SpO2 96%   BMI 17.64 kg/m   Physical Exam Constitutional:      General: He is not in acute distress.    Comments: Sleepy but easily arousable. He answered all questions appropriately.   HENT:     Mouth/Throat:     Mouth: Mucous membranes are moist.     Pharynx: Oropharynx is clear.  Cardiovascular:     Rate and Rhythm: Normal rate and regular rhythm.     Pulses: Normal pulses.     Heart sounds: Normal heart sounds.  Pulmonary:     Effort: Pulmonary effort is normal.     Breath sounds: Rhonchi present.  Abdominal:     General: Bowel sounds are normal.     Palpations: Abdomen is soft.  Musculoskeletal:     Comments: Right below the knee amputation.   Skin:    General: Skin is warm and dry.  Neurological:     Mental Status: He is oriented to  person, place, and time.     Cranial Nerves: No dysarthria or facial asymmetry.     Sensory: Sensation is intact.     Motor: Weakness present.     Comments: 3/5 left arm motor strength. Unable to squeeze with left hand. 5/5 strength on the right arm, right extremity and left leg.      Imaging: CT Code Stroke CTA Head W/WO contrast  Addendum Date: 11/21/2019   ADDENDUM REPORT: 11/21/2019 22:59 ADDENDUM: Study discussed by telephone with Dr. Vanita Panda in the ED on 11/21/2019 at 2252 hours. Electronically Signed   By: Genevie Ann M.D.   On: 11/21/2019 22:59   Result Date: 11/21/2019 CLINICAL DATA:  69 year old male with confusion, left upper extremity weakness. Plain head CT raising the possibility of small left parietal infarct. History of lung cancer EXAM: CT ANGIOGRAPHY HEAD AND NECK TECHNIQUE: Multidetector CT imaging of the head and neck was performed using the standard protocol during bolus administration of intravenous contrast. Multiplanar CT image reconstructions and MIPs were obtained to evaluate the vascular anatomy. Carotid stenosis measurements (when applicable) are obtained utilizing NASCET criteria, using the distal internal carotid diameter as the denominator. CONTRAST:  79mL OMNIPAQUE IOHEXOL 350 MG/ML SOLN COMPARISON:  Plain head CT 1650 hours today. CTA head and neck 02/16/2017. Recent CTA chest 11/16/2019. FINDINGS: CTA NECK Skeleton: Stable visualized osseous structures. Degenerative appearing heterogeneous sclerosis in the cervical spine. 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 Continued patchy opacity in the visible superior segment of the left lower lobe (series 6, image 157) from CTA 5 days ago. Other neck: Limited by extensive paravertebral and other bilateral neck venous contrast reflux. No neck mass or lymphadenopathy is evident. Aortic arch: Stable aortic arch since 2019 with  mild atherosclerosis. Three vessel arch configuration. Right carotid system: Patent right CCA origin but high-grade right CCA stenosis in the lower neck at the level of of of the thyroid  over a segment of about 14 mm, see series 9, image 158. Radiographic string sign stenosis on series 6, image 132 this is chronic but progressed since 2019. The right CCA remains patent. Negative right carotid bifurcation. Right ICA is patent without significant plaque or stenosis. Left carotid system: Patent left CCA origin but similar abrupt radiographic string sign stenosis of the left CCA in the lower neck (series 6, image 135 and series 9, image 148 over a segment of about 15 mm. Despite this the left CCA remains patent. This stenosis has not significantly changed since 2019. Negative left carotid bifurcation.  No cervical left ICA stenosis. Vertebral arteries: Limited cervical vertebral artery detail related to abundant paravertebral venous contrast, especially on the right where the vertebral is non dominant and diminutive. The right vertebral does remain patent to the skull base. Proximal left subclavian artery and left vertebral origin are within normal limits. Tortuous left V1. Dominant left vertebral artery is patent to the skull base without visible stenosis. CTA HEAD Posterior circulation: Dominant left vertebral primarily supplies the basilar. Patent left PICA origin. Diminutive right vertebral artery is patent to the vertebrobasilar junction with patent PICA origin, stable since 2019. Patent basilar artery without stenosis. Patent SCA and PCA origins. Posterior communicating arteries are diminutive or absent. Bilateral PCA branches are within normal limits. Anterior circulation: Both ICA siphons are patent. There is moderate supraclinoid segment atherosclerosis on the left resulting in mild supraclinoid stenosis. Cavernous and supraclinoid plaque and irregularity on the right also with mild stenosis. Patent carotid  termini. Patent MCA and ACA origins. Dominant right ACA A1 as before. Anterior communicating artery and bilateral ACA branches are stable and within normal limits. Left MCA M1 segment and bifurcation are patent without stenosis. Left MCA branches are stable and within normal limits. Right MCA M1 segment and bifurcation are patent without stenosis. Right MCA branches are stable and within normal limits. Venous sinuses: Intracranial venous structures are patent. Left IJ venous reflux extends into the head as far as the torcula. Left upper extremity contrast injection with extensive paravertebral and other bilateral neck venous contrast reflux. 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 In particular, there is a suspicious pattern of contrast reflux into the left IJ, with the round central filling defect within the vein (series 6, image 100) which seems to abate at the angle of the mandible. Anatomic variants: Dominant left vertebral artery primarily supplies the basilar. Dominant right ACA A1. Review of the MIP images confirms the above findings IMPRESSION: 1. Negative for large vessel occlusion. 2. 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 Bilateral CCA RADIOGRAPHIC STRING SIGNS, stable on the left but progressed on the right since a 2019 CTA. Recommend Vascular Surgery consultation. 3. 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 Left neck Venous Doppler Ultrasound should be confirmatory. 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 4. No carotid bifurcation plaque or stenosis. Bilateral ICA siphon plaque with only mild stenosis. Otherwise intracranial anterior and posterior  circulation within normal limits. 5. 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 Continued abnormal opacity in the superior segment of the left lower lobe from the Chest CTA 5  days ago. Electronically Signed: By: Genevie Ann M.D. On: 11/21/2019 22:42   DG Chest 2 View  Result Date: 11/09/2019 CLINICAL DATA:  Follow-up pneumonia EXAM: CHEST - 2 VIEW COMPARISON:  10/28/2019, 07/06/2015 CT 10/28/2019 FINDINGS: Resolved left suprahilar airspace disease. Mild residual streaky right infrahilar opacity. Paramediastinal distortion and elevation of the hila presumably due to post treatment change is unchanged. Normal heart size. No pneumothorax. Symmetrical small nodular opacities at the lung bases probably represent nipple shadow. IMPRESSION: Resolved left suprahilar airspace disease. Mild residual streaky right infrahilar opacity, may reflect mild residual infiltrate. Presumed paramediastinal post treatment changes are otherwise unchanged. Electronically Signed   By: Donavan Foil M.D.   On: 11/09/2019 15:23   CT HEAD WO CONTRAST  Result Date: 11/21/2019 CLINICAL DATA:  Nystagmus.  Left upper arm weakness.  Confusion. EXAM: CT HEAD WITHOUT CONTRAST TECHNIQUE: Contiguous axial images were obtained from the base of the skull through the vertex without intravenous contrast. COMPARISON:  10/28/2019 FINDINGS: Brain: Area of low-density noted in the left parietal region not seen on prior study compatible with acute to subacute infarction. No additional acute intracranial abnormality. No hemorrhage or hydrocephalus. Vascular: No hyperdense vessel or unexpected calcification. Skull: No acute calvarial abnormality. Sinuses/Orbits: No acute findings Other: None IMPRESSION: Small area of low-density in the left parietal lobe compatible with acute to subacute infarction. Electronically Signed   By: Rolm Baptise M.D.   On: 11/21/2019 18:41   CT Head Wo Contrast  Result  Date: 10/28/2019 CLINICAL DATA:  Non-small cell lung cancer now with dizziness and balance difficulties. Headache. EXAM: CT HEAD WITHOUT CONTRAST TECHNIQUE: Contiguous axial images were obtained from the base of the skull through the vertex without intravenous contrast. COMPARISON:  MRI brain 08/29/2018 FINDINGS: Brain: No mass effect or midline shift. No abnormal extra-axial fluid collections. Gray-white matter junctions are distinct. Basal cisterns are not effaced. No acute intracranial hemorrhage. No specific evidence of an intracranial mass although contrast-enhanced MRI would be the most sensitive modality for detection of intracranial metastatic disease. Vascular: No hyperdense vessel or unexpected calcification. Skull: Calvarium appears intact.  No focal lesions identified. Sinuses/Orbits: Paranasal sinuses and mastoid air cells are clear. Other: None. IMPRESSION: No acute intracranial abnormalities. No specific evidence of an intracranial mass although contrast-enhanced MRI would be the most sensitive modality for detection of intracranial metastatic disease. Electronically Signed   By: Lucienne Capers M.D.   On: 10/28/2019 22:28   CT Code Stroke CTA Neck W/WO contrast  Addendum Date: 11/21/2019   ADDENDUM REPORT: 11/21/2019 22:59 ADDENDUM: Study discussed by telephone with Dr. Vanita Panda in the ED on 11/21/2019 at 2252 hours. Electronically Signed   By: Genevie Ann M.D.   On: 11/21/2019 22:59   Result Date: 11/21/2019 CLINICAL DATA:  69 year old male with confusion, left upper extremity weakness. Plain head CT raising the possibility of small left parietal infarct. History of lung cancer EXAM: CT ANGIOGRAPHY HEAD AND NECK TECHNIQUE: Multidetector CT imaging of the head and neck was performed using the standard protocol during bolus administration of intravenous contrast. Multiplanar CT image reconstructions and MIPs were obtained to evaluate the vascular anatomy. Carotid stenosis measurements (when  applicable) are obtained utilizing NASCET criteria, using the distal internal carotid diameter as the denominator. CONTRAST:  46mL OMNIPAQUE IOHEXOL 350 MG/ML SOLN COMPARISON:  Plain head CT 1650 hours today. CTA head and neck 02/16/2017. Recent CTA chest 11/16/2019. FINDINGS: CTA NECK Skeleton: Stable visualized osseous structures. Degenerative appearing heterogeneous sclerosis in the cervical spine. Probable chronic  radiation osteonecrosis of the right manubrium. 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 Continued patchy opacity in the visible superior segment of the left lower lobe (series 6, image 157) from CTA 5 days ago. Other neck: Limited by extensive paravertebral and other bilateral neck venous contrast reflux. No neck mass or lymphadenopathy is evident. Aortic arch: Stable aortic arch since 2019 with mild atherosclerosis. Three vessel arch configuration. Right carotid system: Patent right CCA origin but high-grade right CCA stenosis in the lower neck at the level of of of the thyroid over a segment of about 14 mm, see series 9, image 158. Radiographic string sign stenosis on series 6, image 132 this is chronic but progressed since 2019. The right CCA remains patent. Negative right carotid bifurcation. Right ICA is patent without significant plaque or stenosis. Left carotid system: Patent left CCA origin but similar abrupt radiographic string sign stenosis of the left CCA in the lower neck (series 6, image 135 and series 9, image 148 over a segment of about 15 mm. Despite this the left CCA remains patent. This stenosis has not significantly changed since 2019. Negative left carotid bifurcation.  No cervical left ICA stenosis. Vertebral arteries: Limited cervical vertebral artery detail related to abundant paravertebral venous contrast, especially on the right where the vertebral is non dominant and diminutive. The right vertebral does remain patent to the skull base.  Proximal left subclavian artery and left vertebral origin are within normal limits. Tortuous left V1. Dominant left vertebral artery is patent to the skull base without visible stenosis. CTA HEAD Posterior circulation: Dominant left vertebral primarily supplies the basilar. Patent left PICA origin. Diminutive right vertebral artery is patent to the vertebrobasilar junction with patent PICA origin, stable since 2019. Patent basilar artery without stenosis. Patent SCA and PCA origins. Posterior communicating arteries are diminutive or absent. Bilateral PCA branches are within normal limits. Anterior circulation: Both ICA siphons are patent. There is moderate supraclinoid segment atherosclerosis on the left resulting in mild supraclinoid stenosis. Cavernous and supraclinoid plaque and irregularity on the right also with mild stenosis. Patent carotid termini. Patent MCA and ACA origins. Dominant right ACA A1 as before. Anterior communicating artery and bilateral ACA branches are stable and within normal limits. Left MCA M1 segment and bifurcation are patent without stenosis. Left MCA branches are stable and within normal limits. Right MCA M1 segment and bifurcation are patent without stenosis. Right MCA branches are stable and within normal limits. Venous sinuses: Intracranial venous structures are patent. Left IJ venous reflux extends into the head as far as the torcula. Left upper extremity contrast injection with extensive paravertebral and other bilateral neck venous contrast reflux. 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 In particular, there is a suspicious pattern of contrast reflux into the left IJ, with the round central filling defect within the vein (series 6, image 100) which seems to abate at the angle of the mandible. Anatomic variants: Dominant left vertebral artery primarily supplies the basilar. Dominant right ACA A1. Review of the MIP images confirms the above findings  IMPRESSION: 1. Negative for large vessel occlusion. 2. 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 Bilateral CCA RADIOGRAPHIC STRING SIGNS, stable on the left but progressed on the right since a 2019 CTA. Recommend Vascular Surgery consultation. 3. Positive also for a pattern of venous reflux into the left internal jugular vein suspicious for LEFT IJ THROMBOSIS below  the mandible. Left neck Venous Doppler Ultrasound should be confirmatory. 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 4. No carotid bifurcation plaque or stenosis. Bilateral ICA siphon plaque with only mild stenosis. Otherwise intracranial anterior and posterior circulation within normal limits. 5. 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 Continued abnormal opacity in the superior segment of the left lower lobe from the Chest CTA 5 days ago. Electronically Signed: By: Genevie Ann M.D. On: 11/21/2019 22:42   CT ANGIO CHEST PE W OR WO CONTRAST  Result Date: 11/16/2019 CLINICAL DATA:  Shortness of breath EXAM: CT ANGIOGRAPHY CHEST WITH CONTRAST TECHNIQUE: Multidetector CT imaging of the chest was performed using the standard protocol during bolus administration of intravenous contrast. Multiplanar CT image reconstructions and MIPs were obtained to evaluate the vascular anatomy. CONTRAST:  148mL OMNIPAQUE IOHEXOL 350 MG/ML SOLN COMPARISON:  Chest x-ray from earlier in the same day, CT from 10/28/2019. FINDINGS: Cardiovascular: Thoracic aorta demonstrates atherosclerotic calcification without aneurysmal dilatation. No cardiac enlargement is seen. The pulmonary artery shows a normal branching pattern bilaterally. Some attenuation of the left upper lobe arterial branches  is seen related to post radiation changes stable from the prior exam. Similar changes are noted in the upper lobe branches on the right related to post radiation change. No definitive filling defects to suggest pulmonary embolism are noted. No coronary calcifications are seen. Thoracic inlet is within normal limits. Mediastinum/Nodes: Thoracic inlet is within normal limits. There is again noted increased soft tissue density along the mediastinum bilaterally related to post radiation change. The hilar fullness seen on the right on the prior exam is less prominent on today's study. The esophagus is within normal limits. Lungs/Pleura: Lungs are well aerated bilaterally. Stable scarring in the lungs is noted along the mediastinum consistent with post radiation change. Persistent airspace change in the left lower lobe superior segment is noted similar changes are noted in the right lower lobe laterally and stable. These may also represent areas of scarring given their long-term stability. No sizable effusion or acute infiltrate is seen. Emphysematous changes are again seen. Upper Abdomen: Visualized upper abdomen is within normal limits. Musculoskeletal: No acute bony abnormality is seen. Review of the MIP images confirms the above findings. IMPRESSION: No evidence of pulmonary emboli. Overall stable appearance of scarring in the paramediastinal regions bilaterally. This attenuates some of the pulmonary arterial branches although no emboli are seen. Patchy airspace disease in the superior segment of the left lower lobe as well as the right lower lobe stable from the prior exam. Persistent infiltrate could not be totally excluded although these changes may represent scarring. Aortic Atherosclerosis (ICD10-I70.0) and Emphysema (ICD10-J43.9). Electronically Signed   By: Inez Catalina M.D.   On: 11/16/2019 18:02   CT Angio Chest PE W and/or Wo Contrast  Result Date: 10/28/2019 CLINICAL DATA:  New onset chest pain and  epigastric pain. History of lung cancer. EXAM: CT ANGIOGRAPHY CHEST CT ABDOMEN AND PELVIS WITH CONTRAST TECHNIQUE: Multidetector CT imaging of the chest was performed using the standard protocol during bolus administration of intravenous contrast. Multiplanar CT image reconstructions and MIPs were obtained to evaluate the vascular anatomy. Multidetector CT imaging of the abdomen and pelvis was performed using the standard protocol during bolus administration of intravenous contrast. CONTRAST:  116mL OMNIPAQUE IOHEXOL 350 MG/ML SOLN COMPARISON:  CT  chest 08/03/2019.  PET-CT 03/30/2019 FINDINGS: CTA CHEST FINDINGS Cardiovascular: Good opacification of the central and segmental pulmonary arteries. No focal filling defects. No evidence of significant pulmonary embolus. Normal heart size. No pericardial effusions. Normal caliber thoracic aorta. Great vessels are patent. Mild aortic calcification. 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 Mediastinum/Nodes: Right hilar soft tissue or lymphadenopathy measuring 1.7 cm diameter. Consolidation and volume loss along the medial aspect of the mediastinum bilaterally. Left anterior paramediastinal soft tissue. This is likely post radiation change. Similar appearance to previous study. Esophagus is decompressed. Moderate prominence of paraesophageal and para-aortic lymph nodes at the crus of the diaphragm, measuring up to about 1 cm short axis dimension but more prominent than on prior study. Possible metastatic disease. Lungs/Pleura: Scarring in the medial lungs is likely post radiation change. Bronchiectasis and bronchial wall thickening. New areas of airspace disease in the posterior left upper lung and in the right lung base possibly representing superimposed pneumonia. No pleural effusions. No pneumothorax. Musculoskeletal: No destructive bone lesions. Review of the MIP images confirms the above findings. CT ABDOMEN and PELVIS  FINDINGS Hepatobiliary: No focal liver abnormality is seen. No gallstones, gallbladder wall thickening, or biliary dilatation. Pancreas: Unremarkable. No pancreatic ductal dilatation or surrounding inflammatory changes. Spleen: Normal in size without focal abnormality. Adrenals/Urinary Tract: Adrenal glands are unremarkable. Kidneys are normal, without renal calculi, focal lesion, or hydronephrosis. Bladder is unremarkable. Stomach/Bowel: Stomach, small bowel, and colon are mostly decompressed with scattered stool in the colon. No inflammatory changes are appreciated. The appendix is normal. Vascular/Lymphatic: Calcification of the aorta. No aneurysm. Moderately prominent retroperitoneal para-aortic lymphadenopathy with individual lymph nodes measuring up to about 9 mm short axis dimension. This is developing since previous study and may represent metastatic disease. Reproductive: Marked enlargement of the prostate gland, measuring 7 cm in diameter. Other: No free air or free fluid in the abdomen. Abdominal wall musculature appears intact. Musculoskeletal: No destructive bone lesions. Review of the MIP images confirms the above findings. IMPRESSION: 1. No evidence of significant pulmonary embolus. 2. Consolidation and volume loss along the medial aspect of the mediastinum bilaterally, likely post radiation change. Similar appearance to previous study. 3. New areas of airspace disease in the posterior left upper lung and in the right lung base possibly representing superimposed pneumonia. 4. Right hilar soft tissue or lymphadenopathy measuring 1.7 cm diameter, unchanged. 5. Moderate prominence of paraesophageal and para-aortic lymph nodes at the crus of the diaphragm, more prominent than on prior study. Possible metastatic disease. 6. Moderately prominent retroperitoneal para-aortic lymph nodes measuring up to about 9 mm short axis dimension, developing since previous study and may represent metastatic disease. 7.  Marked enlargement of the prostate gland. 8. 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 9. Aortic atherosclerosis. Aortic Atherosclerosis (ICD10-I70.0). Electronically Signed   By: Lucienne Capers M.D.   On: 10/28/2019 22:22   MR ANGIO HEAD WO CONTRAST  Result Date: 11/22/2019 CLINICAL DATA:  Stroke, follow-up. Brain mass or lesion, history of small cell lung cancer, rule out metastases. EXAM: MRI HEAD WITH CONTRAST MRA HEAD WITHOUT CONTRAST TECHNIQUE: Multiplanar, multiecho pulse sequences of the brain and surrounding structures were obtained without and with intravenous contrast. Angiographic images of the head were obtained using MRA technique without contrast. CONTRAST:  88mL GADAVIST GADOBUTROL 1 MMOL/ML IV SOLN COMPARISON:  Noncontrast head CT 11/21/2019, noncontrast brain MRI 11/21/2019, CT angiogram head/neck 11/21/2019. Brain MRI 08/29/2018. FINDINGS:  MRI HEAD FINDINGS The examination is markedly motion degraded with severe or moderate/severe motion degradation of all post-contrast sequences. This precludes adequate evaluation for intracranial metastatic disease. There is predominantly gyriform enhancement within the left temporal occipital lobes (series 9, image 8). No other definite sites of abnormal intracranial enhancement are identified. MRA HEAD FINDINGS The examination is moderate to severely motion degraded. This precludes adequate evaluation for intracranial arterial stenoses and limits evaluation for aneurysms. No intracranial large vessel occlusion is identified. Faint flow related signal is seen arising from the intracranial right vertebral artery. In correlating with the prior CTA of 11/21/2019, this vessel is developmentally diminutive. IMPRESSION: MRI brain: 1. Markedly motion degraded examination with severe or moderate/severe motion degradation of all acquired post-contrast sequences. This precludes adequate evaluation for intracranial  metastatic disease, and a repeat examination is recommended when the patient is better able to tolerate study. 2. Predominantly gyriform enhancement within the left temporal occipital lobes. This may be related to the known acute/early subacute infarct at this site. However, attention is recommended on follow-up. 3. No other definite sites of abnormal intracranial enhancement are identified. MRA head: 1. Moderate to severely motion degraded examination, precluding adequate evaluation for intracranial arterial stenoses and limiting evaluation for aneurysms. 2. No intracranial large vessel occlusion is identified. Electronically Signed   By: Kellie Simmering DO   On: 11/22/2019 07:59   MR BRAIN WO CONTRAST  Result Date: 11/21/2019 CLINICAL DATA:  Initial evaluation for neuro deficit, stroke suspected. EXAM: MRI HEAD WITHOUT CONTRAST TECHNIQUE: Multiplanar, multiecho pulse sequences of the brain and surrounding structures were obtained without intravenous contrast. COMPARISON:  Prior CT from earlier the same day. FINDINGS: Brain: Examination degraded by motion artifact. Cerebral volume within normal limits for age. Single tiny remote left cerebellar infarct noted. Patchy and confluent area of restricted diffusion seen involving the left temporal occipital region, consistent with an acute ischemic infarct (series 5, image 84). Associated minimal petechial hemorrhage without hemorrhagic transformation or significant mass effect. Additional scattered predominantly subcentimeter cortical and subcortical ischemic infarcts seen involving the frontal and parietal lobes bilaterally, somewhat linear in watershed in distribution (series 5, images 98, 92, 89). No associated hemorrhage or mass effect. No infratentorial ischemic changes. No other foci of susceptibility artifact to suggest acute or chronic intracranial hemorrhage. No mass lesion, midline shift or mass effect. No hydrocephalus or extra-axial fluid collection.  Pituitary gland suprasellar region within normal limits. Midline structures intact. Vascular: Major intracranial vascular flow voids are grossly maintained. Skull and upper cervical spine: Craniocervical junction within normal limits. Bone marrow signal intensity normal. No scalp soft tissue abnormality. Sinuses/Orbits: Globes and orbital soft tissues within normal limits. Mild mucosal thickening noted within the maxillary sinuses. Paranasal sinuses are otherwise clear. No mastoid effusion. Other: None. IMPRESSION: 1. Acute ischemic infarct involving the left temporoccipital region, corresponding with abnormality on prior CT, and consistent with an acute ischemic infarct. Additional scattered cortical and subcortical infarcts involving the frontal and parietal lobes bilaterally, watershed in distribution. Constellation of findings suggest a recent hypotensive episode/hypoperfusion injury. Associated minimal petechial hemorrhage at the left temporoccipital infarct. No other associated hemorrhage or mass effect. 2. Underlying tiny remote left cerebellar infarct. Otherwise negative brain MRI for age. Electronically Signed   By: Jeannine Boga M.D.   On: 11/21/2019 20:16   MR BRAIN W CONTRAST  Result Date: 11/22/2019 CLINICAL DATA:  Stroke, follow-up. Brain mass or lesion, history of small cell lung cancer, rule out metastases. EXAM: MRI HEAD WITH CONTRAST MRA HEAD  WITHOUT CONTRAST TECHNIQUE: Multiplanar, multiecho pulse sequences of the brain and surrounding structures were obtained without and with intravenous contrast. Angiographic images of the head were obtained using MRA technique without contrast. CONTRAST:  56mL GADAVIST GADOBUTROL 1 MMOL/ML IV SOLN COMPARISON:  Noncontrast head CT 11/21/2019, noncontrast brain MRI 11/21/2019, CT angiogram head/neck 11/21/2019. Brain MRI 08/29/2018. FINDINGS: MRI HEAD FINDINGS The examination is markedly motion degraded with severe or moderate/severe motion degradation  of all post-contrast sequences. This precludes adequate evaluation for intracranial metastatic disease. There is predominantly gyriform enhancement within the left temporal occipital lobes (series 9, image 8). No other definite sites of abnormal intracranial enhancement are identified. MRA HEAD FINDINGS The examination is moderate to severely motion degraded. This precludes adequate evaluation for intracranial arterial stenoses and limits evaluation for aneurysms. No intracranial large vessel occlusion is identified. Faint flow related signal is seen arising from the intracranial right vertebral artery. In correlating with the prior CTA of 11/21/2019, this vessel is developmentally diminutive. IMPRESSION: MRI brain: 1. Markedly motion degraded examination with severe or moderate/severe motion degradation of all acquired post-contrast sequences. This precludes adequate evaluation for intracranial metastatic disease, and a repeat examination is recommended when the patient is better able to tolerate study. 2. Predominantly gyriform enhancement within the left temporal occipital lobes. This may be related to the known acute/early subacute infarct at this site. However, attention is recommended on follow-up. 3. No other definite sites of abnormal intracranial enhancement are identified. MRA head: 1. Moderate to severely motion degraded examination, precluding adequate evaluation for intracranial arterial stenoses and limiting evaluation for aneurysms. 2. No intracranial large vessel occlusion is identified. Electronically Signed   By: Kellie Simmering DO   On: 11/22/2019 07:59   CT ABDOMEN PELVIS W CONTRAST  Result Date: 10/28/2019 CLINICAL DATA:  New onset chest pain and epigastric pain. History of lung cancer. EXAM: CT ANGIOGRAPHY CHEST CT ABDOMEN AND PELVIS WITH CONTRAST TECHNIQUE: Multidetector CT imaging of the chest was performed using the standard protocol during bolus administration of intravenous contrast.  Multiplanar CT image reconstructions and MIPs were obtained to evaluate the vascular anatomy. Multidetector CT imaging of the abdomen and pelvis was performed using the standard protocol during bolus administration of intravenous contrast. CONTRAST:  145mL OMNIPAQUE IOHEXOL 350 MG/ML SOLN COMPARISON:  CT chest 08/03/2019.  PET-CT 03/30/2019 FINDINGS: CTA CHEST FINDINGS Cardiovascular: Good opacification of the central and segmental pulmonary arteries. No focal filling defects. No evidence of significant pulmonary embolus. Normal heart size. No pericardial effusions. Normal caliber thoracic aorta. Great vessels are patent. Mild aortic calcification. 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 Mediastinum/Nodes: Right hilar soft tissue or lymphadenopathy measuring 1.7 cm diameter. Consolidation and volume loss along the medial aspect of the mediastinum bilaterally. Left anterior paramediastinal soft tissue. This is likely post radiation change. Similar appearance to previous study. Esophagus is decompressed. Moderate prominence of paraesophageal and para-aortic lymph nodes at the crus of the diaphragm, measuring up to about 1 cm short axis dimension but more prominent than on prior study. Possible metastatic disease. Lungs/Pleura: Scarring in the medial lungs is likely post radiation change. Bronchiectasis and bronchial wall thickening. New areas of airspace disease in the posterior left upper lung and in the right lung base possibly representing superimposed pneumonia. No pleural effusions. No pneumothorax. Musculoskeletal: No destructive bone lesions. Review of the MIP images confirms the above findings. CT ABDOMEN and PELVIS FINDINGS Hepatobiliary: No focal liver abnormality is seen. No gallstones, gallbladder wall  thickening, or biliary dilatation. Pancreas: Unremarkable. No pancreatic ductal dilatation or surrounding inflammatory changes. Spleen: Normal in size  without focal abnormality. Adrenals/Urinary Tract: Adrenal glands are unremarkable. Kidneys are normal, without renal calculi, focal lesion, or hydronephrosis. Bladder is unremarkable. Stomach/Bowel: Stomach, small bowel, and colon are mostly decompressed with scattered stool in the colon. No inflammatory changes are appreciated. The appendix is normal. Vascular/Lymphatic: Calcification of the aorta. No aneurysm. Moderately prominent retroperitoneal para-aortic lymphadenopathy with individual lymph nodes measuring up to about 9 mm short axis dimension. This is developing since previous study and may represent metastatic disease. Reproductive: Marked enlargement of the prostate gland, measuring 7 cm in diameter. Other: No free air or free fluid in the abdomen. Abdominal wall musculature appears intact. Musculoskeletal: No destructive bone lesions. Review of the MIP images confirms the above findings. IMPRESSION: 1. No evidence of significant pulmonary embolus. 2. Consolidation and volume loss along the medial aspect of the mediastinum bilaterally, likely post radiation change. Similar appearance to previous study. 3. New areas of airspace disease in the posterior left upper lung and in the right lung base possibly representing superimposed pneumonia. 4. Right hilar soft tissue or lymphadenopathy measuring 1.7 cm diameter, unchanged. 5. Moderate prominence of paraesophageal and para-aortic lymph nodes at the crus of the diaphragm, more prominent than on prior study. Possible metastatic disease. 6. Moderately prominent retroperitoneal para-aortic lymph nodes measuring up to about 9 mm short axis dimension, developing since previous study and may represent metastatic disease. 7. Marked enlargement of the prostate gland. 8. 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 9. Aortic atherosclerosis. Aortic Atherosclerosis (ICD10-I70.0). Electronically Signed   By: Lucienne Capers M.D.   On: 10/28/2019 22:22   DG Chest Port 1 View  Result Date: 11/21/2019 CLINICAL DATA:  Shortness of breath EXAM: PORTABLE CHEST 1 VIEW COMPARISON:  11/16/2019, CT 11/16/2019, PET CT 03/30/2019 FINDINGS: No acute consolidation or effusion. Bilateral hilar retraction with bilateral upper paramediastinal opacity without change. Stable cardiac size. No pneumothorax IMPRESSION: No active disease. Similar appearance of bilateral hilar retraction and upper paramediastinal opacity. Electronically Signed   By: Donavan Foil M.D.   On: 11/21/2019 20:54   DG Chest Port 1 View  Result Date: 11/16/2019 CLINICAL DATA:  Lung cancer.  Acute exacerbation COPD. EXAM: PORTABLE CHEST 1 VIEW COMPARISON:  CTA chest 10/28/2019 FINDINGS: Heart size is normal. Superior mediastinal soft tissue is again noted. Peripheral nodular opacity in the right lung is increasing in size. No edema is present. No effusions are present. IMPRESSION: Increasing size of peripheral nodular opacity in the right lung. Stable soft tissue the superior mediastinum. No acute cardiopulmonary disease. Electronically Signed   By: San Morelle M.D.   On: 11/16/2019 06:44   DG Chest Portable 1 View  Result Date: 10/28/2019 CLINICAL DATA:  Shortness of breath.  History of lung cancer. EXAM: PORTABLE CHEST 1 VIEW COMPARISON:  CT chest dated July 04, 2019 FINDINGS: Persistent medial bilateral upper lung zone airspace opacification is noted which is likely related to prior treatment. There are parent new opacities in the left upper lung zone and right infrahilar region. The heart size appears stable. Aortic calcifications are noted. There is no pneumothorax. There is stable elevation of the left hemidiaphragm. No definite acute osseous abnormality. IMPRESSION: 1. Suggestion of new airspace opacities in the right infrahilar region and left upper lobe which may indicate developing infiltrates in the appropriate clinical setting. 2. Persistent  post treatment changes in  the bilateral upper lung zones. 3. Stable elevation of the left hemidiaphragm. 4. No convincing large pleural effusion. Electronically Signed   By: Constance Holster M.D.   On: 10/28/2019 18:10   VAS [place] UPPER EXTREMITY VENOUS DUPLEX  Result Date: 11/22/2019 UPPER VENOUS STUDY  Indications: Swelling, and Pain Risk Factors: Cancer Lung. Performing Technologist: Griffin Basil RCT RDMS  Examination Guidelines: A complete evaluation includes B-mode imaging, spectral Doppler, color Doppler, and power Doppler as needed of all accessible portions of each vessel. Bilateral testing is considered an integral part of a complete examination. Limited examinations for reoccurring indications may be performed as noted.  Left Findings: +----------+------------+---------+-----------+----------+-------+ LEFT      CompressiblePhasicitySpontaneousPropertiesSummary +----------+------------+---------+-----------+----------+-------+ IJV           None       No        No                       +----------+------------+---------+-----------+----------+-------+ Subclavian    None       No        No                       +----------+------------+---------+-----------+----------+-------+ Axillary      None       No        No                       +----------+------------+---------+-----------+----------+-------+ Brachial    Partial      Yes       Yes                      +----------+------------+---------+-----------+----------+-------+ Radial        Full                                          +----------+------------+---------+-----------+----------+-------+ Ulnar         Full                                          +----------+------------+---------+-----------+----------+-------+ Cephalic      Full                                          +----------+------------+---------+-----------+----------+-------+ Basilic       Full                                           +----------+------------+---------+-----------+----------+-------+  Summary:  Right: No evidence of thrombosis in the subclavian.  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 Previous Cat Scan Yesterday.  *See table(s) above for measurements and observations.    Preliminary     Labs:  CBC: Recent Labs    11/18/19 0453 11/18/19 0453 11/19/19 0431 11/19/19 0431 11/21/19 1855 11/21/19 1905 11/21/19 2115 11/22/19 0347  WBC 7.8  --  15.1*  --  11.8*  --   --  10.2  HGB 10.6*   < > 10.6*   < > 12.7* 13.6 13.3 10.8*  HCT 32.3*   < > 32.7*   < > 40.5 40.0 39.0 32.7*  PLT 472*  --  543*  --  421*  --   --  397   < > = values in this interval not displayed.    COAGS: Recent Labs    11/21/19 2158  INR 1.2  APTT 28    BMP: Recent Labs    10/28/19 1935 10/28/19 1935 10/29/19 0401 10/29/19 0401 10/30/19 0542 10/30/19 0542 10/31/19 0523 11/09/19 2136 11/16/19 0357 11/16/19 0357 11/18/19 0453 11/18/19 0453 11/19/19 0431 11/19/19 0431 11/21/19 1855 11/21/19 1905 11/21/19 2115 11/22/19 0347  NA 136   < > 137   < > 132*   < > 135   < > 136   < > 136   < > 138  --  131* 139 138  --   K 4.2   < > 4.4   < > 4.4   < > 4.8   < > 3.9   < > 4.1   < > 3.8  --  8.3* 4.3 3.9  --   CL 100   < > 102   < > 99   < > 99   < > 104   < > 103   < > 101  --  99 106 103  --   CO2 25   < > 24   < > 25   < > 25   < > 24  --  23  --  26  --  21*  --   --   --   GLUCOSE 90   < > 96   < > 150*   < > 141*   < > 105*   < > 166*   < > 161*  --  120* 122* 115*  --   BUN 10   < > 9   < > 11   < > 13   < > 18   < > 18   < > 18  --  18 22 20   --   CALCIUM 8.7*   < > 8.8*   < > 8.8*   < > 8.7*   < > 8.6*  --  8.9  --  8.8*  --  8.2*  --   --   --   CREATININE 0.70   < > 0.81   < > 0.68   < > 0.64   < > 0.74   < > 0.62   < > 0.62   < > 1.00 0.60* 0.60* 0.52*    GFRNONAA >60   < > >60   < > >60   < > >60   < > >60   < > >60  --  >60  --  >60  --   --  >60  GFRAA >60  --  >60  --  >60  --  >60  --   --   --   --   --   --   --   --   --   --   --    < > = values in this interval not displayed.    LIVER FUNCTION TESTS: Recent Labs    10/28/19 1935 10/29/19 0401 11/15/19 1243 11/21/19 1855  BILITOT 0.4 0.4 0.4 <0.1*  AST 19 16 21  71*  ALT 19 16  26 94*  ALKPHOS 85 80 86 89  PROT 7.4 6.9 7.2 6.3*  ALBUMIN 3.1* 2.9* 3.4* 2.9*    TUMOR MARKERS: No results for input(s): AFPTM, CEA, CA199, CHROMGRNA in the last 8760 hours.  Assessment and Plan:  Acute cerebral vascular accident; bilateral carotid artery stenosis: Evan [name], 69 year old male, is tentatively scheduled for a diagnostic cerebral angiogram 11/23/19 at the [facility] Interventional Radiology department. His son and sister were at the bedside during this evaluation and his son signed the consent form.   Risks and benefits of this procedure were discussed with the patient including, but not limited to bleeding, infection, vascular injury or contrast induced renal failure.  This interventional procedure involves the use of X-rays and because of the nature of the planned procedure, it is possible that we will have prolonged use of X-ray fluoroscopy.  Potential radiation risks to you include (but are not limited to) the following: - A slightly elevated risk for cancer  several years later in life. This risk is typically less than 0.5% percent. This risk is low in comparison to the normal incidence of human cancer, which is 33% for women and 50% for men according to the [place]. - Radiation induced injury can include skin redness, resembling a rash, tissue breakdown / ulcers and hair loss (which can be temporary or permanent).   The likelihood of either of these occurring depends on the difficulty of the procedure and whether you are sensitive to  radiation due to previous procedures, disease, or genetic conditions.   IF your procedure requires a prolonged use of radiation, you will be notified and given written instructions for further action.  It is your responsibility to monitor the irradiated area for the 2 weeks following the procedure and to notify your physician if you are concerned that you have suffered a radiation induced injury.    All of the patient's questions were answered, patient is agreeable to proceed.  The patient will be NPO after midnight. AM labs have been ordered.   Consent signed and in chart.  Thank you for this interesting consult.  I greatly enjoyed meeting Evan [name] and look forward to participating in their care.  A copy of this report was sent to the requesting provider on this date.  Electronically Signed: Soyla Dryer, AGACNP-BC [phone] 11/22/2019, 3:19 PM   I spent a total of 40 Minutes    in face to face in clinical consultation, greater than 50% of which was counseling/coordinating care for image-guided cerebral angiogram

## 2019-11-22 NOTE — Progress Notes (Signed)
PT Cancellation Note  Patient Details Name: VESTER BALTHAZOR MRN: [id number] DOB: Sep 29, 1950   Cancelled Treatment:    Reason Eval/Treat Not Completed: Active bedrest order Will follow up as activity orders updated and as schedule allows.   Lou Miner, DPT  Acute Rehabilitation Services  Pager: [phone] Office: [phone]    Rudean Hitt 11/22/2019, 3:34 PM

## 2019-11-22 NOTE — Progress Notes (Signed)
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 vein (series 6, image 100) which seems to abate at the angle of the mandible. Anatomic variants: Dominant left vertebral artery primarily supplies the basilar. Dominant right ACA A1. Review of the MIP images confirms the above findings IMPRESSION: 1. Negative for large vessel occlusion. 2. 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 Bilateral CCA RADIOGRAPHIC STRING SIGNS, stable on the left but progressed on the right since a 2019 CTA. Recommend Vascular Surgery consultation. 3. 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 Left neck Venous Doppler Ultrasound should be confirmatory. 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 4. No carotid bifurcation plaque or stenosis. Bilateral ICA siphon plaque with only mild stenosis. Otherwise intracranial anterior and posterior circulation within normal limits. 5. 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 Continued abnormal opacity in the superior segment of the left lower lobe from the Chest CTA 5 days ago. Electronically Signed: By: Genevie Ann M.D. On: 11/21/2019 22:42   MR ANGIO HEAD WO CONTRAST  Result Date: 11/22/2019 CLINICAL DATA:  Stroke, follow-up. Brain mass or lesion, history of small cell lung cancer, rule out metastases. EXAM: MRI HEAD WITH CONTRAST MRA HEAD WITHOUT CONTRAST TECHNIQUE: Multiplanar, multiecho pulse sequences of the brain and surrounding structures were obtained without and with intravenous contrast. Angiographic images of the head were obtained using MRA technique without contrast. CONTRAST:  75mL GADAVIST GADOBUTROL 1 MMOL/ML IV SOLN COMPARISON:  Noncontrast head CT 11/21/2019, noncontrast brain MRI 11/21/2019, CT angiogram head/neck 11/21/2019. Brain MRI 08/29/2018. FINDINGS: MRI HEAD FINDINGS The examination is markedly motion degraded with severe or moderate/severe motion degradation of all post-contrast sequences. This precludes adequate evaluation for intracranial metastatic disease. There is predominantly gyriform enhancement within the left temporal occipital lobes (series 9, image 8). No other definite sites of abnormal intracranial enhancement are identified. MRA HEAD FINDINGS The examination is moderate to severely motion degraded. This precludes adequate evaluation for intracranial arterial stenoses and limits  evaluation for aneurysms. No intracranial large vessel occlusion is identified. Faint flow related signal is seen arising from the intracranial right vertebral artery. In correlating with the prior CTA of 11/21/2019, this vessel is developmentally diminutive. IMPRESSION: MRI brain: 1. Markedly motion degraded examination with severe or moderate/severe motion degradation of all acquired post-contrast sequences. This precludes adequate evaluation for intracranial metastatic disease, and a repeat examination is recommended when the patient is better able to tolerate study. 2. Predominantly gyriform enhancement within the left temporal occipital lobes. This may be related to the known acute/early subacute infarct at this site. However, attention is recommended on follow-up. 3. No other definite sites of abnormal intracranial enhancement are identified. MRA head: 1. Moderate to severely motion degraded examination, precluding adequate evaluation for intracranial arterial stenoses and limiting evaluation for aneurysms. 2. No intracranial large vessel occlusion is identified. Electronically Signed   By: Kellie Simmering DO   On: 11/22/2019 07:59   MR BRAIN WO CONTRAST  Result Date: 11/21/2019 CLINICAL DATA:  Initial evaluation for neuro deficit, stroke suspected. EXAM:  MRI HEAD WITHOUT CONTRAST TECHNIQUE: Multiplanar, multiecho pulse sequences of the brain and surrounding structures were obtained without intravenous contrast. COMPARISON:  Prior CT from earlier the same day. FINDINGS: Brain: Examination degraded by motion artifact. Cerebral volume within normal limits for age. Single tiny remote left cerebellar infarct noted. Patchy and confluent area of restricted diffusion seen involving the left temporal occipital region, consistent with an acute ischemic infarct (series 5, image 84). Associated minimal petechial hemorrhage without hemorrhagic transformation or significant mass effect. Additional scattered predominantly  subcentimeter cortical and subcortical ischemic infarcts seen involving the frontal and parietal lobes bilaterally, somewhat linear in watershed in distribution (series 5, images 98, 92, 89). No associated hemorrhage or mass effect. No infratentorial ischemic changes. No other foci of susceptibility artifact to suggest acute or chronic intracranial hemorrhage. No mass lesion, midline shift or mass effect. No hydrocephalus or extra-axial fluid collection. Pituitary gland suprasellar region within normal limits. Midline structures intact. Vascular: Major intracranial vascular flow voids are grossly maintained. Skull and upper cervical spine: Craniocervical junction within normal limits. Bone marrow signal intensity normal. No scalp soft tissue abnormality. Sinuses/Orbits: Globes and orbital soft tissues within normal limits. Mild mucosal thickening noted within the maxillary sinuses. Paranasal sinuses are otherwise clear. No mastoid effusion. Other: None. IMPRESSION: 1. Acute ischemic infarct involving the left temporoccipital region, corresponding with abnormality on prior CT, and consistent with an acute ischemic infarct. Additional scattered cortical and subcortical infarcts involving the frontal and parietal lobes bilaterally, watershed in distribution. Constellation of findings suggest a recent hypotensive episode/hypoperfusion injury. Associated minimal petechial hemorrhage at the left temporoccipital infarct. No other associated hemorrhage or mass effect. 2. Underlying tiny remote left cerebellar infarct. Otherwise negative brain MRI for age. Electronically Signed   By: Jeannine Boga M.D.   On: 11/21/2019 20:16   MR BRAIN W CONTRAST  Result Date: 11/22/2019 CLINICAL DATA:  Stroke, follow-up. Brain mass or lesion, history of small cell lung cancer, rule out metastases. EXAM: MRI HEAD WITH CONTRAST MRA HEAD WITHOUT CONTRAST TECHNIQUE: Multiplanar, multiecho pulse sequences of the brain and surrounding  structures were obtained without and with intravenous contrast. Angiographic images of the head were obtained using MRA technique without contrast. CONTRAST:  56mL GADAVIST GADOBUTROL 1 MMOL/ML IV SOLN COMPARISON:  Noncontrast head CT 11/21/2019, noncontrast brain MRI 11/21/2019, CT angiogram head/neck 11/21/2019. Brain MRI 08/29/2018. FINDINGS: MRI HEAD FINDINGS The examination is markedly motion degraded with severe or moderate/severe motion degradation of all post-contrast sequences. This precludes adequate evaluation for intracranial metastatic disease. There is predominantly gyriform enhancement within the left temporal occipital lobes (series 9, image 8). No other definite sites of abnormal intracranial enhancement are identified. MRA HEAD FINDINGS The examination is moderate to severely motion degraded. This precludes adequate evaluation for intracranial arterial stenoses and limits evaluation for aneurysms. No intracranial large vessel occlusion is identified. Faint flow related signal is seen arising from the intracranial right vertebral artery. In correlating with the prior CTA of 11/21/2019, this vessel is developmentally diminutive. IMPRESSION: MRI brain: 1. Markedly motion degraded examination with severe or moderate/severe motion degradation of all acquired post-contrast sequences. This precludes adequate evaluation for intracranial metastatic disease, and a repeat examination is recommended when the patient is better able to tolerate study. 2. Predominantly gyriform enhancement within the left temporal occipital lobes. This may be related to the known acute/early subacute infarct at this site. However, attention is recommended on follow-up. 3. No other definite sites of abnormal intracranial enhancement are identified. MRA head: 1. Moderate to  severely motion degraded examination, precluding adequate evaluation for intracranial arterial stenoses and limiting evaluation for aneurysms. 2. No intracranial  large vessel occlusion is identified. Electronically Signed   By: Kellie Simmering DO   On: 11/22/2019 07:59   DG Chest Port 1 View  Result Date: 11/21/2019 CLINICAL DATA:  Shortness of breath EXAM: PORTABLE CHEST 1 VIEW COMPARISON:  11/16/2019, CT 11/16/2019, PET CT 03/30/2019 FINDINGS: No acute consolidation or effusion. Bilateral hilar retraction with bilateral upper paramediastinal opacity without change. Stable cardiac size. No pneumothorax IMPRESSION: No active disease. Similar appearance of bilateral hilar retraction and upper paramediastinal opacity. Electronically Signed   By: Donavan Foil M.D.   On: 11/21/2019 20:54        Scheduled Meds: .  stroke: mapping our early stages of recovery book   Does not apply Once  . aspirin  325 mg Oral Daily  . atorvastatin  80 mg Oral Daily  . enoxaparin (LOVENOX) injection  40 mg Subcutaneous Q24H   Continuous Infusions: . sodium chloride 100 mL/hr at 11/22/19 0342     LOS: 1 day    Time spent: 35 mins.More than 50% of that time was spent in counseling and/or coordination of care.      Shelly Coss, MD [facility] P10/21/2021, 8:55 AM

## 2019-11-22 NOTE — ED Notes (Signed)
This RN spoke with neurologist and was given verbal orders to keep pt in supine position, give a 500 bolus, and complete the NIH stroke scale. Will continue to monitor pt status.

## 2019-11-22 NOTE — ED Notes (Signed)
Report given to [facility]. 

## 2019-11-23 ENCOUNTER — Inpatient Hospital Stay (HOSPITAL_COMMUNITY): Payer: Medicare PPO

## 2019-11-23 DIAGNOSIS — I10 Essential (primary) hypertension: Secondary | ICD-10-CM | POA: Diagnosis not present

## 2019-11-23 DIAGNOSIS — I639 Cerebral infarction, unspecified: Secondary | ICD-10-CM | POA: Diagnosis not present

## 2019-11-23 DIAGNOSIS — I6523 Occlusion and stenosis of bilateral carotid arteries: Secondary | ICD-10-CM | POA: Diagnosis not present

## 2019-11-23 HISTORY — PX: IR ANGIO VERTEBRAL SEL VERTEBRAL UNI L MOD SED: IMG5367

## 2019-11-23 HISTORY — PX: IR ANGIO VERTEBRAL SEL SUBCLAVIAN INNOMINATE UNI R MOD SED: IMG5365

## 2019-11-23 HISTORY — PX: IR US GUIDE VASC ACCESS RIGHT: IMG2390

## 2019-11-23 HISTORY — PX: IR ANGIO INTRA EXTRACRAN SEL COM CAROTID INNOMINATE BILAT MOD SED: IMG5360

## 2019-11-23 LAB — URINALYSIS, ROUTINE W REFLEX MICROSCOPIC

## 2019-11-23 LAB — CBC WITH DIFFERENTIAL/PLATELET
Abs Immature Granulocytes: 0.06 10*3/uL (ref 0.00–0.07)
Basophils Absolute: 0 10*3/uL (ref 0.0–0.1)
Basophils Relative: 0 %
Eosinophils Absolute: 0 10*3/uL (ref 0.0–0.5)
Eosinophils Relative: 0 %
HCT: 33.6 % — ABNORMAL LOW (ref 39.0–52.0)
Hemoglobin: 10.8 g/dL — ABNORMAL LOW (ref 13.0–17.0)
Immature Granulocytes: 1 %
Lymphocytes Relative: 3 %
Lymphs Abs: 0.3 10*3/uL — ABNORMAL LOW (ref 0.7–4.0)
MCH: 26.7 pg (ref 26.0–34.0)
MCHC: 32.1 g/dL (ref 30.0–36.0)
MCV: 83.2 fL (ref 80.0–100.0)
Monocytes Absolute: 0.8 10*3/uL (ref 0.1–1.0)
Monocytes Relative: 6 %
Neutro Abs: 10.7 10*3/uL — ABNORMAL HIGH (ref 1.7–7.7)
Neutrophils Relative %: 90 %
Platelets: 425 10*3/uL — ABNORMAL HIGH (ref 150–400)
RBC: 4.04 MIL/uL — ABNORMAL LOW (ref 4.22–5.81)
RDW: 16.2 % — ABNORMAL HIGH (ref 11.5–15.5)
WBC: 11.9 10*3/uL — ABNORMAL HIGH (ref 4.0–10.5)
nRBC: 0 % (ref 0.0–0.2)

## 2019-11-23 LAB — BASIC METABOLIC PANEL
Anion gap: 12 (ref 5–15)
BUN: 14 mg/dL (ref 8–23)
CO2: 23 mmol/L (ref 22–32)
Calcium: 8.6 mg/dL — ABNORMAL LOW (ref 8.9–10.3)
Chloride: 100 mmol/L (ref 98–111)
Creatinine, Ser: 0.7 mg/dL (ref 0.61–1.24)
GFR, Estimated: >60 mL/min (ref >60)
Glucose, Bld: 125 mg/dL — ABNORMAL HIGH (ref 70–99)
Potassium: 3.7 mmol/L (ref 3.5–5.1)
Sodium: 135 mmol/L (ref 135–145)

## 2019-11-23 LAB — URINALYSIS, MICROSCOPIC (REFLEX)
RBC / HPF: >50 RBC/hpf (ref 0–5)
Squamous Epithelial / HPF: NONE SEEN (ref 0–5)

## 2019-11-23 LAB — HEMOGLOBIN A1C
Hgb A1c MFr Bld: 6.3 % — ABNORMAL HIGH (ref 4.8–5.6)
Mean Plasma Glucose: 134 mg/dL

## 2019-11-23 LAB — PLATELET INHIBITION P2Y12: Platelet Function  P2Y12: 6 [PRU] — ABNORMAL LOW (ref 182–335)

## 2019-11-23 MED ORDER — SODIUM CHLORIDE 0.9 % IV SOLN: INTRAVENOUS | Status: AC

## 2019-11-23 MED ORDER — FENTANYL CITRATE (PF) 100 MCG/2ML IJ SOLN
INTRAMUSCULAR | Status: AC
Start: 2019-11-23 — End: 2019-11-24
  Filled 2019-11-23: qty 2

## 2019-11-23 MED ORDER — IOHEXOL 300 MG/ML  SOLN
150.0000 mL | Freq: Once | INTRAMUSCULAR | Status: AC | PRN
  Administered 2019-11-23: 75 mL via INTRA_ARTERIAL

## 2019-11-23 MED ORDER — SODIUM CHLORIDE 0.9 % IV SOLN: INTRAVENOUS | Status: DC

## 2019-11-23 MED ORDER — VERAPAMIL HCL 2.5 MG/ML IV SOLN
INTRAVENOUS | Status: AC
  Filled 2019-11-23: qty 2

## 2019-11-23 MED ORDER — NITROGLYCERIN 1 MG/10 ML FOR IR/CATH LAB
INTRA_ARTERIAL | Status: AC | PRN
  Administered 2019-11-23: 100 ug via INTRA_ARTERIAL

## 2019-11-23 MED ORDER — SODIUM CHLORIDE 0.9 % IV SOLN
INTRAVENOUS | Status: AC | PRN
  Administered 2019-11-23: 250 mL via INTRAVENOUS

## 2019-11-23 MED ORDER — MIDAZOLAM HCL 2 MG/2ML IJ SOLN
INTRAMUSCULAR | Status: AC
  Filled 2019-11-23: qty 2

## 2019-11-23 MED ORDER — HEPARIN SODIUM (PORCINE) 1000 UNIT/ML IJ SOLN
INTRAMUSCULAR | Status: AC
  Filled 2019-11-23: qty 1

## 2019-11-23 MED ORDER — FENTANYL CITRATE (PF) 100 MCG/2ML IJ SOLN
INTRAMUSCULAR | Status: AC | PRN
Start: 2019-11-23 — End: 2019-11-23
  Administered 2019-11-23: 12.5 ug via INTRAVENOUS

## 2019-11-23 MED ORDER — MIDAZOLAM HCL 2 MG/2ML IJ SOLN
INTRAMUSCULAR | Status: AC | PRN
  Administered 2019-11-23: 0.5 mg via INTRAVENOUS

## 2019-11-23 MED ORDER — LIDOCAINE HCL (PF) 1 % IJ SOLN
INTRAMUSCULAR | Status: AC | PRN
  Administered 2019-11-23: 10 mL

## 2019-11-23 MED ORDER — NITROGLYCERIN 1 MG/10 ML FOR IR/CATH LAB: INTRA_ARTERIAL | Status: AC | PRN

## 2019-11-23 MED ORDER — LIDOCAINE HCL (PF) 1 % IJ SOLN
INTRAMUSCULAR | Status: AC
  Filled 2019-11-23: qty 30

## 2019-11-23 MED ORDER — SODIUM CHLORIDE 0.9 % IV BOLUS
500.0000 mL | Freq: Once | INTRAVENOUS | Status: AC
  Administered 2019-11-23: 500 mL via INTRAVENOUS

## 2019-11-23 NOTE — Progress Notes (Signed)
Pt stable during shift and remains on Bipap. Pt ativan ordered last night was not given as medication was changed to haldol and haldol given effective. 1 mg ativan was wasted RN Phil, therefore 2mg  ativan pulled was wasted in stericycle with nurse Philomena. Reported off to oncoming RN and pt daughter at bedside. Delia Heady RN

## 2019-11-23 NOTE — Progress Notes (Signed)
SLP Cancellation Note  Patient Details Name: Evan [name] MRN: [id number] DOB: 1951/01/15   Cancelled treatment:       Reason Eval/Treat Not Completed: Medical issues which prohibited therapy; pending procedure; checked with pt/family multiple times throughout day with procedure still pending; ST will f/u next date.   Elvina Sidle, M.S., CCC-SLP 11/23/2019, 3:06 PM

## 2019-11-23 NOTE — Progress Notes (Signed)
PT Cancellation Note  Patient Details Name: Evan [name] MRN: [id number] DOB: March 26, 1950   Cancelled Treatment:    Reason Eval/Treat Not Completed: Patient at procedure or test/unavailable. Pt off floor at IR for cerebral angiogram. PT will follow up when the pt is back to the floor and appropriate for PT intervention.   Zenaida Niece 11/23/2019, 1:52 PM

## 2019-11-23 NOTE — Progress Notes (Signed)
Pt returned from procedural unit with noticeable blood in the urine. MD made aware and a urine collection was obtained.

## 2019-11-23 NOTE — Progress Notes (Signed)
Initial Nutrition Assessment  DOCUMENTATION CODES:   Severe malnutrition in context of chronic illness  INTERVENTION:  Once pt's diet is advanced:  -Ensure Enlive po TID, each supplement provides 350 kcal and 20 grams of protein -MVI daily   NUTRITION DIAGNOSIS:   Severe Malnutrition related to chronic illness, cancer and cancer related treatments as evidenced by severe muscle depletion, severe fat depletion.    GOAL:   Patient will meet greater than or equal to 90% of their needs    MONITOR:   PO intake, Supplement acceptance, Labs, Weight trends, I & O's, Diet advancement  REASON FOR ASSESSMENT:   Malnutrition Screening Tool    ASSESSMENT:   Pt admitted with CVA. PMH includes lung Ca stage III s/p chemo now in remission, recent hospitalization with recuurent PNA, COPD, depression, L renal mass, anxiety disorder, traumatic R BKA.  Pt's son and daughter at beside providing 33 of history. Pt's family reports that they hired a Retail banker for the pt after his last hospitalization and state that he has been eating well since that point -- typically 3 balanced meals per day with Ensure Plus/Enlive between meals. Discussed methods for increasing pt's kcal/protein consumption at home. Pt's family interested in pt seeing an outpatient RD to help with wt restoration. Will place consult for NDES.  No PO intake documented.  Per wt readings, pt has had a 19.8% wt loss x 7 months, which is significant for time frame.   UOP: 1235ml x24 hours I/O: -464ml since admit  Labs reviewed. Medications: robinul   NUTRITION - FOCUSED PHYSICAL EXAM:    Most Recent Value  Orbital Region Moderate depletion  Upper Arm Region Severe depletion  Thoracic and Lumbar Region Severe depletion  Buccal Region Severe depletion  Temple Region Moderate depletion  Clavicle Bone Region Severe depletion  Clavicle and Acromion Bone Region Severe depletion  Scapular Bone Region Severe  depletion  Dorsal Hand Moderate depletion  Patellar Region Severe depletion  Anterior Thigh Region Severe depletion  Posterior Calf Region Severe depletion  Edema (RD Assessment) None  Hair Reviewed  Eyes Reviewed  Mouth Reviewed  Skin Reviewed  Nails Reviewed       Diet Order:   Diet Order            Diet NPO time specified Except for: Sips with Meds  Diet effective midnight                 EDUCATION NEEDS:   Education needs have been addressed  Skin:  Skin Assessment: Reviewed RN Assessment  Last BM:  10/21  Height:   Ht Readings from Last 1 Encounters:  11/21/19 5\' 5"  (1.651 m)    Weight:   Wt Readings from Last 1 Encounters:  11/21/19 48.1 kg   BMI:  Body mass index is 17.64 kg/m.  Estimated Nutritional Needs:   Kcal:  1700-1900  Protein:  85-100 grams  Fluid:  >/=1.7L/d    Larkin Ina, MS, RD, LDN RD pager number and weekend/on-call pager number located in [facility].

## 2019-11-23 NOTE — Progress Notes (Signed)
3cc of air removed. Bilat. 2+ radial pulse; warm; less than 3

## 2019-11-23 NOTE — Progress Notes (Signed)
STROKE TEAM PROGRESS NOTE   INTERVAL HISTORY Son and daughter are at bedside.  Patient had shortly rest last night, received a CPAP and suctioning.  Symptoms improved.  This morning no shortness of breath, still has gurgly sound in the throat, however no dysphagia or dysarthria.  Had cerebral angiogram with Dr. Estanislado Pandy, plan to treat right CCA next Monday.  Vitals:   11/23/19 0416 11/23/19 0529 11/23/19 0741 11/23/19 0802  BP: 110/88   98/70  Pulse: (!) 102 100  95  Resp: 18 14  16   Temp: 97.6 F (36.4 C)   97.6 F (36.4 C)  TempSrc: Oral   Axillary  SpO2: 100% 100% 100% 100%  Weight:      Height:       CBC:  Recent Labs  Lab 11/21/19 1855 11/21/19 1905 11/22/19 0347 11/23/19 0225  WBC 11.8*   < > 10.2 11.9*  NEUTROABS 10.4*  --   --  10.7*  HGB 12.7*   < > 10.8* 10.8*  HCT 40.5   < > 32.7* 33.6*  MCV 87.3   < > 84.5 83.2  PLT 421*   < > 397 425*   < > = values in this interval not displayed.   Basic Metabolic Panel:  Recent Labs  Lab 11/18/19 0453 11/18/19 0453 11/19/19 0431 11/19/19 0431 11/21/19 1855 11/21/19 1905 11/21/19 2115 11/21/19 2115 11/22/19 0347 11/23/19 0225  NA 136   < > 138   < > 131*   < > 138  --   --  135  K 4.1   < > 3.8   < > 8.3*   < > 3.9  --   --  3.7  CL 103   < > 101   < > 99   < > 103  --   --  100  CO2 23   < > 26   < > 21*  --   --   --   --  23  GLUCOSE 166*   < > 161*   < > 120*   < > 115*  --   --  125*  BUN 18   < > 18   < > 18   < > 20  --   --  14  CREATININE 0.62   < > 0.62   < > 1.00   < > 0.60*   < > 0.52* 0.70  CALCIUM 8.9   < > 8.8*   < > 8.2*  --   --   --   --  8.6*  MG 2.2  --  2.2  --   --   --   --   --   --   --   PHOS 2.1*  --  2.4*  --   --   --   --   --   --   --    < > = values in this interval not displayed.   Lipid Panel:  Recent Labs  Lab 11/22/19 0347  CHOL 232*  TRIG 54  HDL 67  CHOLHDL 3.5  VLDL 11  LDLCALC 154*   HgbA1c:  Recent Labs  Lab 11/22/19 0347  HGBA1C 6.3*   Urine Drug  Screen: No results for input(s): LABOPIA, COCAINSCRNUR, LABBENZ, AMPHETMU, THCU, LABBARB in the last 168 hours.  Alcohol Level No results for input(s): ETH in the last 168 hours.  IMAGING past 24 hours DG CHEST PORT 1 VIEW  Result Date: 11/22/2019 CLINICAL  DATA:  Dyspnea. EXAM: PORTABLE CHEST 1 VIEW COMPARISON:  November 21, 2019. FINDINGS: The heart size and mediastinal contours are within normal limits. No pneumothorax or pleural effusion is noted. Stable findings consistent with bilateral hilar retraction. Right apical pleural thickening is noted which is unchanged. No acute pulmonary disease is noted. The visualized skeletal structures are unremarkable. IMPRESSION: No active disease. Electronically Signed   By: Marijo Conception M.D.   On: 11/22/2019 19:57   ECHOCARDIOGRAM COMPLETE  Result Date: 11/22/2019    ECHOCARDIOGRAM REPORT   Patient Name:   Evan [name] Date of Exam: 11/22/2019 Medical Rec #:  [id number]       Height:       65.0 in Accession #:    [id number]      Weight:       106.0 lb Date of Birth:  1951-01-11       BSA:          1.510 m Patient Age:    69 years        BP:           117/76 mmHg Patient Gender: M               HR:           106 bpm. Exam Location:  Inpatient Procedure: 2D Echo, Cardiac Doppler and Color Doppler Indications:    Stroke 434.91 / I163.9  History:        Patient has no prior history of Echocardiogram examinations.                 COPD; Risk Factors:Current Smoker and Dyslipidemia.  Sonographer:    Bernadene Person RDCS Referring Phys: [place]  1. Left ventricular ejection fraction, by estimation, is 55 to 60%. The left ventricle has normal function. The left ventricle has no regional wall motion abnormalities. Left ventricular diastolic parameters are consistent with Grade I diastolic dysfunction (impaired relaxation). Elevated left atrial pressure.  2. Right ventricular systolic function is normal. The right ventricular size is normal.  3.  The mitral valve is normal in structure. Trivial mitral valve regurgitation. No evidence of mitral stenosis.  4. The aortic valve is normal in structure. Aortic valve regurgitation is not visualized. No aortic stenosis is present.  5. The inferior vena cava is normal in size with greater than 50% respiratory variability, suggesting right atrial pressure of 3 mmHg. Comparison(s): No prior Echocardiogram. FINDINGS  Left Ventricle: Left ventricular ejection fraction, by estimation, is 55 to 60%. The left ventricle has normal function. The left ventricle has no regional wall motion abnormalities. The left ventricular internal cavity size was normal in size. There is  no left ventricular hypertrophy. Left ventricular diastolic parameters are consistent with Grade I diastolic dysfunction (impaired relaxation). Elevated left atrial pressure. Right Ventricle: The right ventricular size is normal. No increase in right ventricular wall thickness. Right ventricular systolic function is normal. Left Atrium: Left atrial size was normal in size. Right Atrium: Right atrial size was normal in size. Pericardium: There is no evidence of pericardial effusion. Mitral Valve: The mitral valve is normal in structure. Trivial mitral valve regurgitation. No evidence of mitral valve stenosis. Tricuspid Valve: The tricuspid valve is normal in structure. Tricuspid valve regurgitation is trivial. No evidence of tricuspid stenosis. Aortic Valve: The aortic valve is normal in structure. Aortic valve regurgitation is not visualized. No aortic stenosis is present. Pulmonic Valve: The pulmonic valve was normal in structure. Pulmonic  valve regurgitation is not visualized. No evidence of pulmonic stenosis. Aorta: The aortic root is normal in size and structure. Venous: The inferior vena cava is normal in size with greater than 50% respiratory variability, suggesting right atrial pressure of 3 mmHg. IAS/Shunts: No atrial level shunt detected by color  flow Doppler.  LEFT VENTRICLE PLAX 2D LVIDd:         3.70 cm  Diastology LVIDs:         2.10 cm  LV e' medial:    3.92 cm/s LV PW:         0.80 cm  LV E/e' medial:  14.5 LV IVS:        0.80 cm  LV e' lateral:   9.67 cm/s LVOT diam:     2.10 cm  LV E/e' lateral: 5.9 LV SV:         68 LV SV Index:   45 LVOT Area:     3.46 cm  RIGHT VENTRICLE RV S prime:     12.00 cm/s TAPSE (M-mode): 1.9 cm LEFT ATRIUM             Index      RIGHT ATRIUM          Index LA diam:        1.90 cm 1.26 cm/m RA Area:     7.97 cm LA Vol (A2C):   12.0 ml 7.95 ml/m RA Volume:   14.60 ml 9.67 ml/m LA Vol (A4C):   12.9 ml 8.54 ml/m LA Biplane Vol: 12.6 ml 8.34 ml/m  AORTIC VALVE LVOT Vmax:   108.00 cm/s LVOT Vmean:  70.900 cm/s LVOT VTI:    0.196 m  AORTA Ao Root diam: 3.50 cm MITRAL VALVE MV Area (PHT): 2.37 cm    SHUNTS MV Decel Time: 320 msec    Systemic VTI:  0.20 m MV E velocity: 56.90 cm/s  Systemic Diam: 2.10 cm MV A velocity: 67.20 cm/s MV E/A ratio:  0.85 Ena Dawley MD Electronically signed by Ena Dawley MD Signature Date/Time: 11/22/2019/6:53:37 PM    Final     PHYSICAL EXAM  Temp:  [97.5 F (36.4 C)-98.3 F (36.8 C)] 97.6 F (36.4 C) (10/22 0802) Pulse Rate:  [93-130] 95 (10/22 0802) Resp:  [14-28] 16 (10/22 0802) BP: (97-163)/(70-108) 98/70 (10/22 0802) SpO2:  [94 %-100 %] 100 % (10/22 0802) FiO2 (%):  [32 %] 32 % (10/22 0741)  General - Well nourished, well developed, in no apparent distress.  Ophthalmologic - fundi not visualized due to noncooperation.  Cardiovascular - Regular rhythm and rate.  Respiratory - b/l Rhonchi, secretion in throat with weak cough.  Neuro - awake alert, orientated x3.  No aphasia, no dysarthria, weak cough, able to name and repeat.  Follows simple commands.  No gaze preference, visual field fall.  No significant facial droop, tongue midline.  Left upper extremity 3+/5 deltoid, 3/5 bicep, 2+/5 tricep and finger grip.  Right upper extremity at least 4/5.  Bilateral  lower extremity at least 4/5 with right BKA.  Sensation symmetrical.  Finger-to-nose intact on the right.  Gait not tested.  ASSESSMENT/PLAN [name] is a 69 y.o. male with history of recurrent non small cell lung cancer (stage IIIa status post chemo and radiation), traumatic right knee BKA, recent recurrent pneumonia, left renal mass, known prior bilateral carotid artery stenosis, COPD w/ recent exacerbation d/c 10/19. He has had generalized weakness and poor sleep, chronic swallowing, wt loss with poor apptetite and his  son and dtr recently moved in to help him. Plans for prostate bx for which he had stopped his aspirin. On 10/20 presented to [facility] ED with difficulty w/ spatial awareness and vision guided movements, HA, L sided weakness. Transfered to [employer]. [facility]   Stroke: Left temporooccipital infarct and R>L watershed infarcts in setting of known B CCA stenosis secondary to radiation  CT head small L parietal hypodensity  MRI  L temporooccipital infarct. Additional scattered cortical + subcortical B watershed infarcts. Min petechial hemorrhage in L temporooccipital infarct. Old tiny L cerebellar infarct  CTA head & neck no LVO. 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 suspicion for L IJ thrombosis. B ICA siphon w/ mild stenosis. LLL opacity.   MRA head  Unremarkable   Cerebral angio confirmed bilateral CCA high-grade stenosis  2D Echo EF 55 to 60%  LDL 154   HgbA1c 6.3  VTE prophylaxis - Lovenox 40 mg sq daily   off aspirin for planned prostate bx prior to admission, now on aspirin 81 mg daily and brilinta 90 bid.   Therapy recommendations:  pending   Disposition:  pending   Respiratory Distress / Agitation  Increased WOB 10/21 evening  Put on BIPAP w/ relief  Changed level to progressive care   Treated w/ ativan and haldol. Good relief w/ haldol  Carotid Stenosis   CTA neck 02/2017 Long segment of severe 80%  right and severe 90% left proximal  common carotid artery stenosis.  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, given his complicated other medical conditions, will not treat at this time   Close monitoring  Low threshold for CTA chest for PE protocol to rule out PE if symptoms concerning  History of hypertension Hypotension  Little low at 113 10/21  Bolus IVF followed by albumin w/ IVF at 100  Avoid low BP  Long-term BP goal 130-150 give carotid stenosis   Hyperlipidemia  Home meds:  No statin  Now on lipitor 80  LDL 154, goal < 70  Continue statin at discharge  Non Small cell lung CA  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   recurrence on the left side presented as a stage IIIa non-small cell lung cancer, squamous cell carcinoma status post concurrent chemoradiation followed by consolidation chemotherapy.  Completed in March 2017.  He also underwent SBRT to the right middle lobe lung nodule under the care  of Dr. [name] completed on 07/24/2018.  He also underwent SBRT to left lower lobe lung nodule under the care of Dr. [name] completed on April 27, 2019.  Recent scan showed no concerning findings for disease progression  Followed with Dr. Julien Nordmann  Other Stroke Risk Factors  Advanced age  Cigarette smoker, quit 7 mos ago, advised to stop smoking  Other Active Problems  Recent COPD exacerbation, d/c 10/19  Recent recurrent PNA  Hematuria with urinary retention. Foley.   Marked enlargement of the Prostate w/ plans for bx    Cachexia, Body mass index is 17.64 kg/m.   Traumatic right [facility] day # 2  I spent  35 minutes in total face-to-face time with the patient, more than 50% of which was spent in counseling and coordination of care, reviewing test results, images and medication, and discussing the diagnosis, treatment plan and potential prognosis. This patient's care requiresreview of multiple databases, neurological assessment, discussion with family, other specialists and medical decision making of high complexity. I had long discussion with son and daughter at bedside, updated pt current condition, treatment plan and potential prognosis, and answered all the questions.  They expressed understanding and appreciation.  I also discussed with Dr. Estanislado Pandy.   Rosalin Hawking, MD PhD Stroke Neurology 11/23/2019 11:45 AM  To contact Stroke Continuity provider, please refer to [email or web address] After hours, contact General Neurology

## 2019-11-23 NOTE — Progress Notes (Signed)
Progress Note    Evan [name]  TGY:[id number] DOB: 10/27/1950  DOA: 11/21/2019 PCP: Patient, No Pcp Per    Brief Narrative:    Medical records reviewed and are as summarized below:  Evan [name] is an 69 y.o. 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  On arrival potassium was noted to be more than 8.  Patient also had confusion with mild aphasia.  He reported having problem with his gait.  He normally will walk with his walking stick but was unable to do it this morning.  He was feeling lightheaded.  In the ER evaluation with CT and MRI showed new acute left parieto-occipital infarct.  Patient is therefore being admitted to the hospital for evaluation.  Patient does not talk much most of the history is from the daughter this at bedside.  He had no prior strokes.  He has history of hypertension and hyperlipidemia.  Also known to have bilateral carotids artery stenosis.  He felt like he may have had a stroke prior to coming into the hospital apparently.  At this point no focal weakness.  His only weakness is on the left upper extremity which is from last week's admission and has been unchanged.  Patient also has risk factors including tobacco abuse but he quit smoking currently.  Patient also noted for some nystagmus..  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   CT head showed a small area of low density in the left parietal lobe compatible with acute to subacute infarction.  MRI of the brain confirmed acute ischemic infarct involving the left temporaloccipital region.  Stroke work-up initiated.  Neurology following. PT/OT/speech evaluation pending -  LDL of 154-statin -  Hemoglobin A1c 6.3 Echo : Left ventricular ejection fraction, by estimation, is 55 to 60%. The  left ventricle has normal function. The left ventricle has no regional  wall motion abnormalities. Left ventricular diastolic parameters are  consistent with Grade I diastolic dysfunction (impaired relaxation). Elevated left atrial pressure. CTA head and neck did not show any large vessel occlusion but showed chronic Severe bilateral Common Carotid Artery stenosis in the lower neck, likely post radiation related. Bilateral radiographic string signs, stable on the left but progressed on the right since last CTA study.  - IR consulted for cerebral angiogram  History of lung cancer:  -Currently in remission.  Stage IIIa, treated with chemotherapy.  Follows with oncology, Dr. Julien Nordmann.  COPD: He was hospitalized recently and was discharged on 11/20/19 after management of COPD exacerbation, pneumonia.  He completed course of Levaquin and was discharged on tapering prednisone dose.  Continue bronchodilators.   -He continues to have wet rhonchi-- suspect upper airway -PRN bipap -nebs -pulm toilet  Hypertension: Allow permissive hypertension.   -BP on the low side -  IVF support  History of smoking: Continue nicotine patch -encourage cessation  Hematuria -2 recent ER visits for say -will order cytology and continue to monitor -outpatient urology follow up in this smoker  Family Communication/Anticipated D/C date and plan/Code Status   DVT  prophylaxis: Lovenox ordered. Code Status: Full Code.  Family Communication: at bedside Disposition Plan: Status is: Inpatient  Remains inpatient appropriate because:Inpatient level of care appropriate due to severity of illness   Dispo: The patient is from: Home              Anticipated d/c is to: tbd              Anticipated d/c date is: > 3 days              Patient currently is not medically stable to d/c.         Medical Consultants:    Neurology  IR     Subjective:   Asking for bipap to be removed  Objective:    Vitals:   11/23/19 0416 11/23/19 0529 11/23/19 0741 11/23/19 0802  BP: 110/88   98/70  Pulse: (!) 102 100  95  Resp: 18 14  16   Temp: 97.6 F (36.4 C)   97.6 F (36.4 C)  TempSrc: Oral   Axillary  SpO2: 100% 100% 100% 100%  Weight:      Height:        Intake/Output Summary (Last 24 hours) at 11/23/2019 1132 Last data filed at 11/22/2019 2337 Gross per 24 hour  Intake 1479.01 ml  Output 1225 ml  Net 254.01 ml   Filed Weights   11/21/19 1819  Weight: 48.1 kg    Exam:  General: Appearance:    Thin male in no acute distress, wearing BIPAP but appear comfortable     Lungs:     respirations unlabored, rhonchus in all lung fields  Heart:    Normal heart rate. Normal rhythm. No murmurs, rubs, or gallops.      Neurologic:   Awake, alert, able to make needs known    Data Reviewed:   I have personally reviewed following labs and imaging studies:  Labs: Labs show the following:   Basic Metabolic Panel: Recent Labs  Lab 11/18/19 0453 11/18/19 0453 11/19/19 0431 11/19/19 0431 11/21/19 1855 11/21/19 1855 11/21/19 1905 11/21/19 1905 11/21/19 2115 11/22/19 0347 11/23/19 0225  NA 136   < > 138  --  131*  --  139  --  138  --  135  K 4.1   < > 3.8   < > 8.3*   < > 4.3   < > 3.9  --  3.7  CL 103   < > 101  --  99  --  106  --  103  --  100  CO2 23  --  26  --  21*  --   --   --   --   --  23  GLUCOSE 166*   < > 161*  --  120*   --  122*  --  115*  --  125*  BUN 18   < > 18  --  18  --  22  --  20  --  14  CREATININE 0.62   < > 0.62   < > 1.00  --  0.60*  --  0.60* 0.52* 0.70  CALCIUM 8.9  --  8.8*  --  8.2*  --   --   --   --   --  8.6*  MG 2.2  --  2.2  --   --   --   --   --   --   --   --   PHOS 2.1*  --  2.4*  --   --   --   --   --   --   --   --    < > = values in this interval not displayed.   GFR Estimated Creatinine Clearance: 60.1 mL/min (by C-G formula based on SCr of 0.7 mg/dL). Liver Function Tests: Recent Labs  Lab 11/21/19 1855  AST 71*  ALT 94*  ALKPHOS 89  BILITOT <0.1*  PROT 6.3*  ALBUMIN 2.9*   No results for input(s): LIPASE, AMYLASE in the last 168 hours. No results for input(s): AMMONIA in the last 168 hours. Coagulation profile Recent Labs  Lab 11/21/19 2158  INR 1.2    CBC: Recent Labs  Lab 11/18/19 0453 11/18/19 0453 11/19/19 0431 11/19/19 0431 11/21/19 1855 11/21/19 1905 11/21/19 2115 11/22/19 0347 11/23/19 0225  WBC 7.8  --  15.1*  --  11.8*  --   --  10.2 11.9*  NEUTROABS  --   --   --   --  10.4*  --   --   --  10.7*  HGB 10.6*   < > 10.6*   < > 12.7* 13.6 13.3 10.8* 10.8*  HCT 32.3*   < > 32.7*   < > 40.5 40.0 39.0 32.7* 33.6*  MCV 82.2  --  83.8  --  87.3  --   --  84.5 83.2  PLT 472*  --  543*  --  421*  --   --  397 425*   < > = values in this interval not displayed.   Cardiac Enzymes: No results for input(s): CKTOTAL, CKMB, CKMBINDEX, TROPONINI in the last 168 hours. BNP (last 3 results) No results for input(s): PROBNP in the last 8760 hours. CBG: Recent Labs  Lab 11/19/19 2232 11/21/19 1841 11/22/19 1052  GLUCAP 160* 128* 92   D-Dimer: No results for input(s): DDIMER in the last 72 hours. Hgb A1c: Recent Labs    11/22/19 0347  HGBA1C 6.3*   Lipid Profile: Recent Labs    11/22/19 0347  CHOL 232*  HDL 67  LDLCALC 154*  TRIG 54  CHOLHDL 3.5   Thyroid function studies: No results for input(s): TSH, T4TOTAL, T3FREE, THYROIDAB in  the last 72 hours.  Invalid input(s): FREET3 Anemia work up: No results for input(s): VITAMINB12, FOLATE, FERRITIN, TIBC, IRON, RETICCTPCT in the last 72 hours. Sepsis Labs: Recent Labs  Lab 11/19/19 0431 11/21/19 1855 11/22/19 0347 11/23/19 0225  WBC 15.1* 11.8* 10.2 11.9*    Microbiology Recent Results (from the past 240 hour(s))  Expectorated sputum assessment w rflx to resp cult     Status: None   Collection Time: 11/15/19 12:53 PM   Specimen: Sputum  Result Value Ref Range Status   Specimen Description SPUTUM  Final   Special Requests Normal  Final   Sputum evaluation   Final    THIS SPECIMEN IS ACCEPTABLE FOR SPUTUM CULTURE Performed at [facility], [place] [address], [place], [place] [address]    Report Status 11/15/2019 FINAL  Final  Culture, respiratory     Status: None   Collection Time: 11/15/19 12:53 PM   Specimen: SPU  Result Value Ref Range Status   Specimen Description   Final    SPUTUM Performed at [facility]  [place] [address], [place], [place] [address]    Special Requests   Final    Normal Reflexed from [id number] Performed at [facility], [place] [address] [place], [place] [address]    Gram Stain   Final    NO WBC SEEN FEW GRAM POSITIVE COCCI IN PAIRS RARE GRAM VARIABLE ROD    Culture   Final    ABUNDANT Normal respiratory flora-no Staph aureus or Pseudomonas seen Performed at [place] [address] [place], [place] [address]    Report Status 11/18/2019 FINAL  Final  Culture, Urine     Status: Abnormal   Collection Time: 11/15/19  4:15 PM   Specimen: Urine, Random  Result Value Ref Range Status   Specimen Description   Final    URINE, RANDOM Performed at [place] [address], [place], [place] [address]    Special Requests   Final    NONE Performed at [facility], [place] [address] [place], [place] [address]    Culture >=100,000  COLONIES/mL YEAST (A)  Final   Report Status 11/17/2019 FINAL  Final  MRSA PCR Screening     Status: None   Collection Time: 11/15/19  4:15 PM   Specimen: Nasopharyngeal  Result Value Ref Range Status   MRSA by PCR NEGATIVE NEGATIVE Final    Comment:        The GeneXpert MRSA Assay (FDA approved for NASAL specimens only), is one component of a comprehensive MRSA colonization surveillance program. It is not intended to diagnose MRSA infection nor to guide or monitor treatment for MRSA infections. Performed at [facility], [place] [address] [place], [place] [address]   Resp Panel by RT PCR (RSV, Flu A&B, Covid) - Nasopharyngeal Swab     Status: None   Collection Time: 11/21/19  7:08 PM   Specimen: Nasopharyngeal Swab  Result Value Ref Range Status   SARS Coronavirus 2 by RT PCR NEGATIVE NEGATIVE Final    Comment: (NOTE) SARS-CoV-2 target nucleic acids are NOT DETECTED.  The SARS-CoV-2 RNA is generally detectable in upper respiratoy specimens during the acute phase of infection. The lowest concentration of SARS-CoV-2 viral copies this assay can detect is 131 copies/mL. A negative result does not preclude SARS-Cov-2 infection and should not be used as the sole basis for treatment or other patient management decisions. A negative result may occur with  improper specimen collection/handling, submission of specimen other than nasopharyngeal swab, presence of viral mutation(s) within the areas targeted by this assay, and inadequate number of viral copies (<131 copies/mL). A negative result must be combined with clinical observations, patient history, and epidemiological information. The expected result is Negative.  Fact Sheet for Patients:  PinkCheek.be  Fact Sheet for Healthcare Providers:  GravelBags.it  This test is no t yet approved or cleared by the [place] FDA and  has been authorized for  detection and/or diagnosis of SARS-CoV-2 by FDA under an Emergency Use Authorization (EUA). This EUA will remain  in effect (meaning this test can be used) for the duration of the COVID-19 declaration under Section 564(b)(1) of the Act, 21 U.S.C. section 360bbb-3(b)(1), unless the authorization is terminated or revoked sooner.     Influenza A by PCR NEGATIVE NEGATIVE Final   Influenza B by PCR NEGATIVE NEGATIVE Final    Comment: (NOTE) The Xpert Xpress SARS-CoV-2/FLU/RSV assay is intended as an aid in  the diagnosis of influenza from Nasopharyngeal  swab specimens and  should not be used as a sole basis for treatment. Nasal washings and  aspirates are unacceptable for Xpert Xpress SARS-CoV-2/FLU/RSV  testing.  Fact Sheet for Patients: PinkCheek.be  Fact Sheet for Healthcare Providers: GravelBags.it  This test is not yet approved or cleared by the [place] FDA and  has been authorized for detection and/or diagnosis of SARS-CoV-2 by  FDA under an Emergency Use Authorization (EUA). This EUA will remain  in effect (meaning this test can be used) for the duration of the  Covid-19 declaration under Section 564(b)(1) of the Act, 21  U.S.C. section 360bbb-3(b)(1), unless the authorization is  terminated or revoked.    Respiratory Syncytial Virus by PCR NEGATIVE NEGATIVE Final    Comment: (NOTE) Fact Sheet for Patients: PinkCheek.be  Fact Sheet for Healthcare Providers: GravelBags.it  This test is not yet approved or cleared by the [place] FDA and  has been authorized for detection and/or diagnosis of SARS-CoV-2 by  FDA under an Emergency Use Authorization (EUA). This EUA will remain  in effect (meaning this test can be used) for the duration of the  COVID-19 declaration under Section 564(b)(1) of the Act, 21 U.S.C.  section 360bbb-3(b)(1), unless the  authorization is terminated or  revoked. Performed at [facility], [place] [address], [place], [place] [address]     Procedures and diagnostic studies:  CT Code Stroke CTA Head W/WO contrast  Addendum Date: 11/21/2019   ADDENDUM REPORT: 11/21/2019 22:59 ADDENDUM: Study discussed by telephone with Dr. Vanita Panda in the ED on 11/21/2019 at 2252 hours. Electronically Signed   By: Genevie Ann M.D.   On: 11/21/2019 22:59   Result Date: 11/21/2019 CLINICAL DATA:  69 year old male with confusion, left upper extremity weakness. Plain head CT raising the possibility of small left parietal infarct. History of lung cancer EXAM: CT ANGIOGRAPHY HEAD AND NECK TECHNIQUE: Multidetector CT imaging of the head and neck was performed using the standard protocol during bolus administration of intravenous contrast. Multiplanar CT image reconstructions and MIPs were obtained to evaluate the vascular anatomy. Carotid stenosis measurements (when applicable) are obtained utilizing NASCET criteria, using the distal internal carotid diameter as the denominator. CONTRAST:  58mL OMNIPAQUE IOHEXOL 350 MG/ML SOLN COMPARISON:  Plain head CT 1650 hours today. CTA head and neck 02/16/2017. Recent CTA chest 11/16/2019. FINDINGS: CTA NECK Skeleton: Stable visualized osseous structures. Degenerative appearing heterogeneous sclerosis in the cervical spine. 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 Continued patchy opacity in the visible superior segment of the left lower lobe (series 6, image 157) from CTA 5 days ago. Other neck: Limited by extensive paravertebral and other bilateral neck venous contrast reflux. No neck mass or lymphadenopathy is evident. Aortic arch: Stable aortic arch since 2019 with mild atherosclerosis. Three vessel arch configuration. Right carotid system: Patent right CCA origin but high-grade right CCA  stenosis in the lower neck at the level of of of the thyroid over a segment of about 14 mm, see series 9, image 158. Radiographic string sign stenosis on series 6, image 132 this is chronic but progressed since 2019. The right CCA remains patent. Negative right carotid bifurcation. Right ICA is patent without significant plaque or stenosis. Left carotid system: Patent left CCA origin but similar abrupt radiographic string sign stenosis of the left CCA in the lower neck (series 6, image 135 and series 9, image 148 over a segment of about 15 mm.  Despite this the left CCA remains patent. This stenosis has not significantly changed since 2019. Negative left carotid bifurcation.  No cervical left ICA stenosis. Vertebral arteries: Limited cervical vertebral artery detail related to abundant paravertebral venous contrast, especially on the right where the vertebral is non dominant and diminutive. The right vertebral does remain patent to the skull base. Proximal left subclavian artery and left vertebral origin are within normal limits. Tortuous left V1. Dominant left vertebral artery is patent to the skull base without visible stenosis. CTA HEAD Posterior circulation: Dominant left vertebral primarily supplies the basilar. Patent left PICA origin. Diminutive right vertebral artery is patent to the vertebrobasilar junction with patent PICA origin, stable since 2019. Patent basilar artery without stenosis. Patent SCA and PCA origins. Posterior communicating arteries are diminutive or absent. Bilateral PCA branches are within normal limits. Anterior circulation: Both ICA siphons are patent. There is moderate supraclinoid segment atherosclerosis on the left resulting in mild supraclinoid stenosis. Cavernous and supraclinoid plaque and irregularity on the right also with mild stenosis. Patent carotid termini. Patent MCA and ACA origins. Dominant right ACA A1 as before. Anterior communicating artery and bilateral ACA branches  are stable and within normal limits. Left MCA M1 segment and bifurcation are patent without stenosis. Left MCA branches are stable and within normal limits. Right MCA M1 segment and bifurcation are patent without stenosis. Right MCA branches are stable and within normal limits. Venous sinuses: Intracranial venous structures are patent. Left IJ venous reflux extends into the head as far as the torcula. Left upper extremity contrast injection with extensive paravertebral and other bilateral neck venous contrast reflux. 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 In particular, there is a suspicious pattern of contrast reflux into the left IJ, with the round central filling defect within the vein (series 6, image 100) which seems to abate at the angle of the mandible. Anatomic variants: Dominant left vertebral artery primarily supplies the basilar. Dominant right ACA A1. Review of the MIP images confirms the above findings IMPRESSION: 1. Negative for large vessel occlusion. 2. 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 Bilateral CCA RADIOGRAPHIC STRING SIGNS, stable on the left but progressed on the right since a 2019 CTA. Recommend Vascular Surgery consultation. 3. 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 Left neck Venous Doppler Ultrasound should be confirmatory. 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 4. No carotid bifurcation plaque or stenosis. Bilateral ICA siphon plaque with only mild stenosis. Otherwise intracranial anterior and posterior circulation within normal limits. 5. 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 Continued abnormal opacity in the superior segment of the left lower lobe from the Chest CTA 5 days ago. Electronically Signed: By: Genevie Ann M.D. On: 11/21/2019 22:42   CT HEAD WO CONTRAST  Result Date: 11/21/2019 CLINICAL DATA:  Nystagmus.  Left upper arm weakness.  Confusion. EXAM: CT HEAD WITHOUT CONTRAST TECHNIQUE: Contiguous axial images were obtained from the base of the skull through the vertex without intravenous contrast. COMPARISON:  10/28/2019 FINDINGS: Brain: Area of low-density noted in the left parietal region not seen on prior study compatible with acute to subacute infarction. No additional acute  intracranial abnormality. No hemorrhage or hydrocephalus. Vascular: No hyperdense vessel or unexpected calcification. Skull: No acute calvarial abnormality. Sinuses/Orbits: No acute findings Other: None IMPRESSION: Small area of low-density in the left parietal lobe compatible with acute to subacute infarction. Electronically Signed   By: Rolm Baptise M.D.   On: 11/21/2019 18:41   CT Code Stroke CTA Neck W/WO contrast  Addendum Date: 11/21/2019   ADDENDUM REPORT: 11/21/2019 22:59 ADDENDUM: Study discussed by telephone with Dr. Vanita Panda in the ED on 11/21/2019 at 2252 hours. Electronically Signed   By: Genevie Ann M.D.   On: 11/21/2019 22:59   Result Date: 11/21/2019 CLINICAL DATA:  70 year old male with confusion, left upper extremity weakness. Plain head CT raising the possibility of small left parietal infarct. History of lung cancer EXAM: CT ANGIOGRAPHY HEAD AND NECK TECHNIQUE: Multidetector CT imaging of the head and neck was performed using the standard protocol during bolus administration of intravenous contrast. Multiplanar CT image reconstructions and MIPs were obtained to evaluate the vascular anatomy. Carotid stenosis measurements (when applicable) are obtained utilizing NASCET criteria, using the distal internal carotid diameter as the denominator. CONTRAST:  75mL OMNIPAQUE  IOHEXOL 350 MG/ML SOLN COMPARISON:  Plain head CT 1650 hours today. CTA head and neck 02/16/2017. Recent CTA chest 11/16/2019. FINDINGS: CTA NECK Skeleton: Stable visualized osseous structures. Degenerative appearing heterogeneous sclerosis in the cervical spine. 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 Continued patchy opacity in the visible superior segment of the left lower lobe (series 6, image 157) from CTA 5 days ago. Other neck: Limited by extensive paravertebral and other bilateral neck venous contrast reflux. No neck mass or lymphadenopathy is evident. Aortic arch: Stable aortic arch since 2019 with mild atherosclerosis. Three vessel arch configuration. Right carotid system: Patent right CCA origin but high-grade right CCA stenosis in the lower neck at the level of of of the thyroid over a segment of about 14 mm, see series 9, image 158. Radiographic string sign stenosis on series 6, image 132 this is chronic but progressed since 2019. The right CCA remains patent. Negative right carotid bifurcation. Right ICA is patent without significant plaque or stenosis. Left carotid system: Patent left CCA origin but similar abrupt radiographic string sign stenosis of the left CCA in the lower neck (series 6, image 135 and series 9, image 148 over a segment of about 15 mm. Despite this the left CCA remains patent. This stenosis has not significantly changed since 2019. Negative left carotid bifurcation.  No cervical left ICA stenosis. Vertebral arteries: Limited cervical vertebral artery detail related to abundant paravertebral venous contrast, especially on the right where the vertebral is non dominant and diminutive. The right vertebral does remain patent to the skull base. Proximal left subclavian artery and left vertebral origin are within normal limits. Tortuous left V1. Dominant left vertebral artery is  patent to the skull base without visible stenosis. CTA HEAD Posterior circulation: Dominant left vertebral primarily supplies the basilar. Patent left PICA origin. Diminutive right vertebral artery is patent to the vertebrobasilar junction with patent PICA origin, stable since 2019. Patent basilar artery without stenosis. Patent SCA and PCA origins. Posterior communicating arteries are diminutive or absent. Bilateral PCA branches are within normal limits. Anterior circulation: Both ICA siphons are patent. There is moderate supraclinoid segment atherosclerosis on the left resulting in mild supraclinoid stenosis. Cavernous and supraclinoid plaque and irregularity on the right also with mild stenosis. Patent carotid termini. Patent  MCA and ACA origins. Dominant right ACA A1 as before. Anterior communicating artery and bilateral ACA branches are stable and within normal limits. Left MCA M1 segment and bifurcation are patent without stenosis. Left MCA branches are stable and within normal limits. Right MCA M1 segment and bifurcation are patent without stenosis. Right MCA branches are stable and within normal limits. Venous sinuses: Intracranial venous structures are patent. Left IJ venous reflux extends into the head as far as the torcula. Left upper extremity contrast injection with extensive paravertebral and other bilateral neck venous contrast reflux. 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 In particular, there is a suspicious pattern of contrast reflux into the left IJ, with the round central filling defect within the vein (series 6, image 100) which seems to abate at the angle of the mandible. Anatomic variants: Dominant left vertebral artery primarily supplies the basilar. Dominant right ACA A1. Review of the MIP images confirms the above findings IMPRESSION: 1. Negative for large vessel occlusion. 2. 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 Bilateral CCA RADIOGRAPHIC STRING SIGNS, stable on the left but progressed on the right since a 2019 CTA. Recommend Vascular Surgery consultation. 3. 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 Left neck Venous Doppler Ultrasound should be confirmatory. 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 4. No carotid bifurcation plaque or stenosis. Bilateral ICA siphon plaque with only mild stenosis. Otherwise intracranial anterior and posterior circulation within normal limits. 5. 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 Continued abnormal opacity in the superior segment of the left lower lobe from the Chest CTA 5 days ago. Electronically Signed: By: Genevie Ann M.D. On: 11/21/2019 22:42   MR ANGIO HEAD WO CONTRAST  Result Date: 11/22/2019 CLINICAL DATA:  Stroke, follow-up. Brain mass or lesion, history of small cell lung cancer, rule out metastases. EXAM: MRI HEAD WITH CONTRAST MRA HEAD WITHOUT CONTRAST TECHNIQUE: Multiplanar, multiecho pulse sequences of the brain and surrounding structures were obtained without and with intravenous contrast. Angiographic images of the head were obtained using MRA technique without contrast. CONTRAST:  15mL GADAVIST GADOBUTROL 1 MMOL/ML IV SOLN COMPARISON:  Noncontrast head CT 11/21/2019, noncontrast brain MRI 11/21/2019, CT angiogram head/neck 11/21/2019. Brain MRI 08/29/2018. FINDINGS: MRI HEAD FINDINGS The examination is markedly motion degraded with severe or moderate/severe motion degradation of all post-contrast sequences. This precludes adequate evaluation for intracranial metastatic disease. There is predominantly gyriform enhancement within  the left temporal occipital lobes (series 9, image 8). No other definite sites of abnormal intracranial enhancement are identified. MRA HEAD FINDINGS The examination is moderate to severely motion degraded. This precludes adequate evaluation for intracranial arterial stenoses and limits evaluation for aneurysms. No intracranial large vessel occlusion is identified. Faint flow related signal is seen arising from the intracranial right vertebral artery. In correlating with the prior CTA of 11/21/2019, this vessel is developmentally diminutive. IMPRESSION: MRI brain: 1. Markedly motion degraded examination with severe or moderate/severe motion degradation of all acquired post-contrast sequences. This precludes adequate evaluation for intracranial metastatic disease, and a repeat examination is recommended when the patient is better able to tolerate study. 2. Predominantly gyriform enhancement within the left temporal  occipital lobes. This may be related to the known acute/early subacute infarct at this site. However, attention is recommended on follow-up. 3. No other definite sites of abnormal intracranial enhancement are identified. MRA head: 1. Moderate to severely motion degraded examination, precluding adequate evaluation for intracranial arterial stenoses and limiting evaluation for aneurysms. 2. No intracranial large vessel occlusion is identified. Electronically Signed   By: Kellie Simmering DO   On: 11/22/2019 07:59   MR BRAIN WO CONTRAST  Result Date: 11/21/2019 CLINICAL DATA:  Initial evaluation for neuro deficit, stroke suspected. EXAM: MRI HEAD WITHOUT CONTRAST TECHNIQUE: Multiplanar, multiecho pulse sequences of the brain and surrounding structures were obtained without intravenous contrast. COMPARISON:  Prior CT from earlier the same day. FINDINGS: Brain: Examination degraded by motion artifact. Cerebral volume within normal limits for age. Single tiny remote left cerebellar infarct noted. Patchy and  confluent area of restricted diffusion seen involving the left temporal occipital region, consistent with an acute ischemic infarct (series 5, image 84). Associated minimal petechial hemorrhage without hemorrhagic transformation or significant mass effect. Additional scattered predominantly subcentimeter cortical and subcortical ischemic infarcts seen involving the frontal and parietal lobes bilaterally, somewhat linear in watershed in distribution (series 5, images 98, 92, 89). No associated hemorrhage or mass effect. No infratentorial ischemic changes. No other foci of susceptibility artifact to suggest acute or chronic intracranial hemorrhage. No mass lesion, midline shift or mass effect. No hydrocephalus or extra-axial fluid collection. Pituitary gland suprasellar region within normal limits. Midline structures intact. Vascular: Major intracranial vascular flow voids are grossly maintained. Skull and upper cervical spine: Craniocervical junction within normal limits. Bone marrow signal intensity normal. No scalp soft tissue abnormality. Sinuses/Orbits: Globes and orbital soft tissues within normal limits. Mild mucosal thickening noted within the maxillary sinuses. Paranasal sinuses are otherwise clear. No mastoid effusion. Other: None. IMPRESSION: 1. Acute ischemic infarct involving the left temporoccipital region, corresponding with abnormality on prior CT, and consistent with an acute ischemic infarct. Additional scattered cortical and subcortical infarcts involving the frontal and parietal lobes bilaterally, watershed in distribution. Constellation of findings suggest a recent hypotensive episode/hypoperfusion injury. Associated minimal petechial hemorrhage at the left temporoccipital infarct. No other associated hemorrhage or mass effect. 2. Underlying tiny remote left cerebellar infarct. Otherwise negative brain MRI for age. Electronically Signed   By: Jeannine Boga M.D.   On: 11/21/2019 20:16   MR  BRAIN W CONTRAST  Result Date: 11/22/2019 CLINICAL DATA:  Stroke, follow-up. Brain mass or lesion, history of small cell lung cancer, rule out metastases. EXAM: MRI HEAD WITH CONTRAST MRA HEAD WITHOUT CONTRAST TECHNIQUE: Multiplanar, multiecho pulse sequences of the brain and surrounding structures were obtained without and with intravenous contrast. Angiographic images of the head were obtained using MRA technique without contrast. CONTRAST:  71mL GADAVIST GADOBUTROL 1 MMOL/ML IV SOLN COMPARISON:  Noncontrast head CT 11/21/2019, noncontrast brain MRI 11/21/2019, CT angiogram head/neck 11/21/2019. Brain MRI 08/29/2018. FINDINGS: MRI HEAD FINDINGS The examination is markedly motion degraded with severe or moderate/severe motion degradation of all post-contrast sequences. This precludes adequate evaluation for intracranial metastatic disease. There is predominantly gyriform enhancement within the left temporal occipital lobes (series 9, image 8). No other definite sites of abnormal intracranial enhancement are identified. MRA HEAD FINDINGS The examination is moderate to severely motion degraded. This precludes adequate evaluation for intracranial arterial stenoses and limits evaluation for aneurysms. No intracranial large vessel occlusion is identified. Faint flow related signal is seen arising from the intracranial right vertebral artery. In correlating with the  prior CTA of 11/21/2019, this vessel is developmentally diminutive. IMPRESSION: MRI brain: 1. Markedly motion degraded examination with severe or moderate/severe motion degradation of all acquired post-contrast sequences. This precludes adequate evaluation for intracranial metastatic disease, and a repeat examination is recommended when the patient is better able to tolerate study. 2. Predominantly gyriform enhancement within the left temporal occipital lobes. This may be related to the known acute/early subacute infarct at this site. However, attention is  recommended on follow-up. 3. No other definite sites of abnormal intracranial enhancement are identified. MRA head: 1. Moderate to severely motion degraded examination, precluding adequate evaluation for intracranial arterial stenoses and limiting evaluation for aneurysms. 2. No intracranial large vessel occlusion is identified. Electronically Signed   By: Kellie Simmering DO   On: 11/22/2019 07:59   DG CHEST PORT 1 VIEW  Result Date: 11/22/2019 CLINICAL DATA:  Dyspnea. EXAM: PORTABLE CHEST 1 VIEW COMPARISON:  November 21, 2019. FINDINGS: The heart size and mediastinal contours are within normal limits. No pneumothorax or pleural effusion is noted. Stable findings consistent with bilateral hilar retraction. Right apical pleural thickening is noted which is unchanged. No acute pulmonary disease is noted. The visualized skeletal structures are unremarkable. IMPRESSION: No active disease. Electronically Signed   By: Marijo Conception M.D.   On: 11/22/2019 19:57   DG Chest Port 1 View  Result Date: 11/21/2019 CLINICAL DATA:  Shortness of breath EXAM: PORTABLE CHEST 1 VIEW COMPARISON:  11/16/2019, CT 11/16/2019, PET CT 03/30/2019 FINDINGS: No acute consolidation or effusion. Bilateral hilar retraction with bilateral upper paramediastinal opacity without change. Stable cardiac size. No pneumothorax IMPRESSION: No active disease. Similar appearance of bilateral hilar retraction and upper paramediastinal opacity. Electronically Signed   By: Donavan Foil M.D.   On: 11/21/2019 20:54   ECHOCARDIOGRAM COMPLETE  Result Date: 11/22/2019    ECHOCARDIOGRAM REPORT   Patient Name:   MARQUEE FUCHS Date of Exam: 11/22/2019 Medical Rec #:  [id number]       Height:       65.0 in Accession #:    [id number]      Weight:       106.0 lb Date of Birth:  01/17/1951       BSA:          1.510 m Patient Age:    82 years        BP:           117/76 mmHg Patient Gender: M               HR:           106 bpm. Exam Location:  Inpatient  Procedure: 2D Echo, Cardiac Doppler and Color Doppler Indications:    Stroke 434.91 / I163.9  History:        Patient has no prior history of Echocardiogram examinations.                 COPD; Risk Factors:Current Smoker and Dyslipidemia.  Sonographer:    Bernadene Person RDCS Referring Phys: [place]  1. Left ventricular ejection fraction, by estimation, is 55 to 60%. The left ventricle has normal function. The left ventricle has no regional wall motion abnormalities. Left ventricular diastolic parameters are consistent with Grade I diastolic dysfunction (impaired relaxation). Elevated left atrial pressure.  2. Right ventricular systolic function is normal. The right ventricular size is normal.  3. The mitral valve is normal in structure. Trivial mitral valve regurgitation. No evidence of mitral  stenosis.  4. The aortic valve is normal in structure. Aortic valve regurgitation is not visualized. No aortic stenosis is present.  5. The inferior vena cava is normal in size with greater than 50% respiratory variability, suggesting right atrial pressure of 3 mmHg. Comparison(s): No prior Echocardiogram. FINDINGS  Left Ventricle: Left ventricular ejection fraction, by estimation, is 55 to 60%. The left ventricle has normal function. The left ventricle has no regional wall motion abnormalities. The left ventricular internal cavity size was normal in size. There is  no left ventricular hypertrophy. Left ventricular diastolic parameters are consistent with Grade I diastolic dysfunction (impaired relaxation). Elevated left atrial pressure. Right Ventricle: The right ventricular size is normal. No increase in right ventricular wall thickness. Right ventricular systolic function is normal. Left Atrium: Left atrial size was normal in size. Right Atrium: Right atrial size was normal in size. Pericardium: There is no evidence of pericardial effusion. Mitral Valve: The mitral valve is normal in structure.  Trivial mitral valve regurgitation. No evidence of mitral valve stenosis. Tricuspid Valve: The tricuspid valve is normal in structure. Tricuspid valve regurgitation is trivial. No evidence of tricuspid stenosis. Aortic Valve: The aortic valve is normal in structure. Aortic valve regurgitation is not visualized. No aortic stenosis is present. Pulmonic Valve: The pulmonic valve was normal in structure. Pulmonic valve regurgitation is not visualized. No evidence of pulmonic stenosis. Aorta: The aortic root is normal in size and structure. Venous: The inferior vena cava is normal in size with greater than 50% respiratory variability, suggesting right atrial pressure of 3 mmHg. IAS/Shunts: No atrial level shunt detected by color flow Doppler.  LEFT VENTRICLE PLAX 2D LVIDd:         3.70 cm  Diastology LVIDs:         2.10 cm  LV e' medial:    3.92 cm/s LV PW:         0.80 cm  LV E/e' medial:  14.5 LV IVS:        0.80 cm  LV e' lateral:   9.67 cm/s LVOT diam:     2.10 cm  LV E/e' lateral: 5.9 LV SV:         68 LV SV Index:   45 LVOT Area:     3.46 cm  RIGHT VENTRICLE RV S prime:     12.00 cm/s TAPSE (M-mode): 1.9 cm LEFT ATRIUM             Index      RIGHT ATRIUM          Index LA diam:        1.90 cm 1.26 cm/m RA Area:     7.97 cm LA Vol (A2C):   12.0 ml 7.95 ml/m RA Volume:   14.60 ml 9.67 ml/m LA Vol (A4C):   12.9 ml 8.54 ml/m LA Biplane Vol: 12.6 ml 8.34 ml/m  AORTIC VALVE LVOT Vmax:   108.00 cm/s LVOT Vmean:  70.900 cm/s LVOT VTI:    0.196 m  AORTA Ao Root diam: 3.50 cm MITRAL VALVE MV Area (PHT): 2.37 cm    SHUNTS MV Decel Time: 320 msec    Systemic VTI:  0.20 m MV E velocity: 56.90 cm/s  Systemic Diam: 2.10 cm MV A velocity: 67.20 cm/s MV E/A ratio:  0.85 Ena Dawley MD Electronically signed by Ena Dawley MD Signature Date/Time: 11/22/2019/6:53:37 PM    Final    VAS [place] UPPER EXTREMITY VENOUS DUPLEX  Result Date: 11/22/2019 UPPER VENOUS STUDY  Indications: Swelling, and Pain Risk Factors: Cancer  Lung. Performing Technologist: Griffin Basil RCT RDMS  Examination Guidelines: A complete evaluation includes B-mode imaging, spectral Doppler, color Doppler, and power Doppler as needed of all accessible portions of each vessel. Bilateral testing is considered an integral part of a complete examination. Limited examinations for reoccurring indications may be performed as noted.  Left Findings: +----------+------------+---------+-----------+----------+-------+ LEFT      CompressiblePhasicitySpontaneousPropertiesSummary +----------+------------+---------+-----------+----------+-------+ IJV           None       No        No                       +----------+------------+---------+-----------+----------+-------+ Subclavian    None       No        No                       +----------+------------+---------+-----------+----------+-------+ Axillary      None       No        No                       +----------+------------+---------+-----------+----------+-------+ Brachial    Partial      Yes       Yes                      +----------+------------+---------+-----------+----------+-------+ Radial        Full                                          +----------+------------+---------+-----------+----------+-------+ Ulnar         Full                                          +----------+------------+---------+-----------+----------+-------+ Cephalic      Full                                          +----------+------------+---------+-----------+----------+-------+ Basilic       Full                                          +----------+------------+---------+-----------+----------+-------+  Summary:  Right: No evidence of thrombosis in the subclavian.  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 Previous Cat Scan Yesterday.  *See  table(s) above for measurements and observations.  Diagnosing physician: Servando Snare MD Electronically signed by Servando Snare MD on 11/22/2019 at 4:11:26 PM.    Final     Medications:   .  stroke: mapping our early stages of recovery book   Does not apply Once  . aspirin EC  81 mg Oral Daily  . atorvastatin  80 mg Oral Daily  . budesonide  0.5 mg Nebulization BID  . Chlorhexidine Gluconate Cloth  6 each Topical Daily  . enoxaparin (LOVENOX) injection  40 mg Subcutaneous Q24H  . glycopyrrolate  0.4 mg Intravenous TID  AC & HS  . guaiFENesin  600 mg Oral BID  . nicotine  14 mg Transdermal Daily  . tamsulosin  0.4 mg Oral QHS  . ticagrelor  90 mg Oral BID   Continuous Infusions: . sodium chloride       LOS: 2 days   Geradine Girt  [facility]   How to contact the [facility] Attending or Consulting provider [place] or covering provider during after hours [place], for this patient?  1. Check the care team in [facility] and look for a) attending/consulting [facility] provider listed and b) the [facility] team listed 2. Log into [email or web address] and use [place]'s universal password to access. If you do not have the password, please contact the hospital operator. 3. Locate the [facility] provider you are looking for under [facility] and page to a number that you can be directly reached. 4. If you still have difficulty reaching the provider, please page the [facility] (Director on Call) for the Hospitalists listed on amion for assistance.  11/23/2019, 11:32 AM

## 2019-11-23 NOTE — Progress Notes (Signed)
3 cc of air removed. Bilt 2+. Warm; less than 3 cap refill

## 2019-11-23 NOTE — Progress Notes (Signed)
OT Cancellation Note  Patient Details Name: Evan [name] MRN: [id number] DOB: [birth year]   Cancelled Treatment:    Reason Eval/Treat Not Completed: Active bedrest order Order received, chart reviewed: noted in chart that pt with active bed rest order, with plans for IR today. OT will hold and continued with efforts schedule and appropriateness permitting Favor Hackler OTR/L acute rehab services Office: [name] 11/23/2019, 1:42 PM

## 2019-11-23 NOTE — Progress Notes (Signed)
3cc of air removed. Bilat. 2+, warm, less than 3 cap refill

## 2019-11-23 NOTE — Progress Notes (Signed)
NIR.  Patient underwent an image-guided diagnostic cerebral arteriogram today with Dr. Estanislado Pandy, results revealed severe bilateral common carotid artery stenosis at proximal/mid levels. Case discussed between Dr. Estanislado Pandy and Dr. Erlinda Hong who recommend revascularization at this time. Due to left CVA, will plan to treat right side first (symptomatic side).  Plan for image-guided cerebral arteriogram with possible revascularization (angioplasty/stent placement) of right common carotid artery stenosis next week in IR- will determine exact date/time based on anesthesia availability.  Continue taking Brilinta 90 mg twice daily and Aspirin 81 mg once daily. Will check P2Y12 Monday AM. Will see patient for consent/place appropriate pre-procedure orders early next week. Further plans per [facility]/neurology/oncology- appreciate and agree with management. NIR to follow.   Evan Graff Siya Flurry, PA-C 11/23/2019, 4:24 PM

## 2019-11-23 NOTE — Procedures (Signed)
S/P 4 vessel cerebral artreriogram RT Radial approach. Findings. 1.Severe bilateral  common carotid artery stenosis at prox/mid levels. 2.Prox occluded hypoplastic RT VA. S.Evan Hebenstreit MD

## 2019-11-24 DIAGNOSIS — I10 Essential (primary) hypertension: Secondary | ICD-10-CM | POA: Diagnosis not present

## 2019-11-24 DIAGNOSIS — I639 Cerebral infarction, unspecified: Secondary | ICD-10-CM | POA: Diagnosis not present

## 2019-11-24 MED ORDER — PANTOPRAZOLE SODIUM 40 MG IV SOLR
40.0000 mg | Freq: Two times a day (BID) | INTRAVENOUS | Status: DC
  Administered 2019-11-24 – 2019-11-26 (×5): 40 mg via INTRAVENOUS
  Filled 2019-11-24 (×5): qty 40

## 2019-11-24 MED ORDER — HYDROXYZINE HCL 50 MG/ML IM SOLN
25.0000 mg | Freq: Once | INTRAMUSCULAR | Status: AC
  Administered 2019-11-24: 25 mg via INTRAMUSCULAR
  Filled 2019-11-24: qty 0.5

## 2019-11-24 NOTE — Progress Notes (Signed)
Progress Note    Evan [name]  RCV:[id number] DOB: [birth year]  DOA: 11/21/2019 PCP: Patient, No Pcp Per    Brief Narrative:    Medical records reviewed and are as summarized below:  Evan [name] is an 69 y.o. 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  Found to have a CVA. Work up in process.  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   CT head showed a small area of low density in the left parietal lobe compatible with acute to subacute infarction.  MRI of the brain confirmed acute ischemic infarct involving the left temporaloccipital region.  Stroke work-up initiated.  Neurology following. PT/OT/speech evaluation pending -  LDL of 154-statin -  Hemoglobin A1c 6.3 Echo : Left ventricular ejection fraction, by estimation, is 55 to 60%. The  left ventricle has normal function. The left ventricle has no regional  wall motion abnormalities. Left ventricular diastolic parameters are  consistent with Grade I diastolic dysfunction (impaired relaxation). Elevated left atrial pressure. CTA head and neck did not show any large vessel occlusion but showed chronic Severe bilateral Common Carotid Artery stenosis in the lower neck,  likely post radiation related. Bilateral radiographic string signs, stable on the left but progressed on the right since last CTA study.  - IR consulted for cerebral angiogram: Plan for image-guided cerebral arteriogram with possible revascularization (angioplasty/stent placement) of right common carotid artery stenosis next week in IR- will determine exact date/time based on anesthesia availability.  Continue taking Brilinta 90 mg twice daily and Aspirin 81 mg once daily. Will check P2Y12 Monday AM.  Dysphagia/concern for aspiration/wet vocals -SLP eval -NPO for now -MBBS in AM -? Need for ENT eval  History of lung cancer:  -Currently in remission.  Stage IIIa, treated with chemotherapy.  Follows with oncology, Dr. Julien Nordmann.  COPD: He was hospitalized recently and was discharged on 11/20/19 after management of COPD exacerbation, pneumonia.  He completed course of Levaquin and was discharged on tapering prednisone dose.  Continue bronchodilators.   -He continues to have wet rhonchi-- suspect upper airway -PRN bipap -nebs -pulm toilet  Hypertension: Allow permissive hypertension.   -BP on the low side -IVF support  History of smoking: Continue nicotine patch -encourage cessation  Hematuria -2 recent ER visits for say -will order cytology and continue to monitor -outpatient urology follow up in this smoker  Family Communication/Anticipated D/C date and plan/Code Status   DVT prophylaxis: Lovenox ordered. Code Status: Full Code.  Family Communication: at bedside Disposition Plan: Status is: Inpatient  Remains inpatient appropriate because:Inpatient level of care appropriate due to severity of illness   Dispo: The patient is from: Home              Anticipated d/c is to: tbd- SNF?  Anticipated d/c date is: > 3 days              Patient currently is not medically stable to d/c.         Medical Consultants:    Neurology  IR     Subjective:    Says he is very anxious about being in the hospital  Objective:    Vitals:   11/24/19 0427 11/24/19 0807 11/24/19 0843 11/24/19 1249  BP: (!) 139/94 106/81  100/73  Pulse: (!) 107 (!) 103  (!) 105  Resp: 19 18  20   Temp: 97.7 F (36.5 C) (!) 97.5 F (36.4 C)  98.3 F (36.8 C)  TempSrc: Oral Oral  Oral  SpO2: 98% 100% 100% 100%  Weight:      Height:        Intake/Output Summary (Last 24 hours) at 11/24/2019 1503 Last data filed at 11/24/2019 1200 Gross per 24 hour  Intake --  Output 1550 ml  Net -1550 ml   Filed Weights   11/21/19 1819  Weight: 48.1 kg    Exam:  General: Appearance:    Thin male with wet/low voice, anxious appearing     Lungs:     Upper airway sounds radiating though lung fields  Heart:    Tachycardic.   MS:   Amputation of right leg- above knee  Neurologic:   Awake, alert- able to communicate needs but voice hard to understand at times    Data Reviewed:   I have personally reviewed following labs and imaging studies:  Labs: Labs show the following:   Basic Metabolic Panel: Recent Labs  Lab 11/18/19 0453 11/18/19 0453 11/19/19 0431 11/19/19 0431 11/21/19 1855 11/21/19 1855 11/21/19 1905 11/21/19 1905 11/21/19 2115 11/22/19 0347 11/23/19 0225  NA 136   < > 138  --  131*  --  139  --  138  --  135  K 4.1   < > 3.8   < > 8.3*   < > 4.3   < > 3.9  --  3.7  CL 103   < > 101  --  99  --  106  --  103  --  100  CO2 23  --  26  --  21*  --   --   --   --   --  23  GLUCOSE 166*   < > 161*  --  120*  --  122*  --  115*  --  125*  BUN 18   < > 18  --  18  --  22  --  20  --  14  CREATININE 0.62   < > 0.62   < > 1.00  --  0.60*  --  0.60* 0.52* 0.70  CALCIUM 8.9  --  8.8*  --  8.2*  --   --   --   --   --  8.6*  MG 2.2  --  2.2  --   --   --   --   --   --   --   --   PHOS 2.1*  --  2.4*  --   --   --   --   --   --   --   --    < > = values in this interval not displayed.   GFR Estimated Creatinine Clearance: 60.1 mL/min (by C-G  formula based on SCr of 0.7 mg/dL). Liver Function Tests: Recent Labs  Lab 11/21/19 1855  AST 71*  ALT 94*  ALKPHOS 89  BILITOT <0.1*  PROT 6.3*  ALBUMIN 2.9*   No results for input(s): LIPASE, AMYLASE in the last 168 hours. No results for input(s): AMMONIA in the last 168 hours. Coagulation profile Recent Labs  Lab 11/21/19 2158  INR 1.2    CBC: Recent Labs  Lab 11/18/19 0453 11/18/19 0453 11/19/19 0431 11/19/19 0431 11/21/19 1855 11/21/19 1905 11/21/19 2115 11/22/19 0347 11/23/19 0225  WBC 7.8  --  15.1*  --  11.8*  --   --  10.2 11.9*  NEUTROABS  --   --   --   --  10.4*  --   --   --  10.7*  HGB 10.6*   < > 10.6*   < > 12.7* 13.6 13.3 10.8* 10.8*  HCT 32.3*   < > 32.7*   < > 40.5 40.0 39.0 32.7* 33.6*  MCV 82.2  --  83.8  --  87.3  --   --  84.5 83.2  PLT 472*  --  543*  --  421*  --   --  397 425*   < > = values in this interval not displayed.   Cardiac Enzymes: No results for input(s): CKTOTAL, CKMB, CKMBINDEX, TROPONINI in the last 168 hours. BNP (last 3 results) No results for input(s): PROBNP in the last 8760 hours. CBG: Recent Labs  Lab 11/19/19 2232 11/21/19 1841 11/22/19 1052  GLUCAP 160* 128* 92   D-Dimer: No results for input(s): DDIMER in the last 72 hours. Hgb A1c: Recent Labs    11/22/19 0347  HGBA1C 6.3*   Lipid Profile: Recent Labs    11/22/19 0347  CHOL 232*  HDL 67  LDLCALC 154*  TRIG 54  CHOLHDL 3.5   Thyroid function studies: No results for input(s): TSH, T4TOTAL, T3FREE, THYROIDAB in the last 72 hours.  Invalid input(s): FREET3 Anemia work up: No results for input(s): VITAMINB12, FOLATE, FERRITIN, TIBC, IRON, RETICCTPCT in the last 72 hours. Sepsis Labs: Recent Labs  Lab 11/19/19 0431 11/21/19 1855 11/22/19 0347 11/23/19 0225  WBC 15.1* 11.8* 10.2 11.9*    Microbiology Recent Results (from the past 240 hour(s))  Expectorated sputum assessment w rflx to resp cult     Status: None   Collection Time:  11/15/19 12:53 PM   Specimen: Sputum  Result Value Ref Range Status   Specimen Description SPUTUM  Final   Special Requests Normal  Final   Sputum evaluation   Final    THIS SPECIMEN IS ACCEPTABLE FOR SPUTUM CULTURE Performed at [facility], [place] [address] [place], [place] [address]    Report Status 11/15/2019 FINAL  Final  Culture, respiratory     Status: None   Collection Time: 11/15/19 12:53 PM   Specimen: SPU  Result Value Ref Range Status   Specimen Description   Final    SPUTUM Performed at [place] [address], [place], [place] [address]    Special Requests   Final    Normal Reflexed from [id number] Performed at [facility], [place] [address] [place], [place] [address]    Gram Stain   Final    NO WBC SEEN FEW GRAM POSITIVE COCCI IN PAIRS RARE GRAM VARIABLE ROD    Culture   Final    ABUNDANT Normal respiratory flora-no Staph aureus or Pseudomonas seen Performed at [facility] [address], [place], [place] [address]  Report Status 11/18/2019 FINAL  Final  Culture, Urine     Status: Abnormal   Collection Time: 11/15/19  4:15 PM   Specimen: Urine, Random  Result Value Ref Range Status   Specimen Description   Final    URINE, RANDOM Performed at [facility] [address], [place], [place] [address]    Special Requests   Final    NONE Performed at [facility], [place] [address] [place], [place] [address]    Culture >=100,000 COLONIES/mL YEAST (A)  Final   Report Status 11/17/2019 FINAL  Final  MRSA PCR Screening     Status: None   Collection Time: 11/15/19  4:15 PM   Specimen: Nasopharyngeal  Result Value Ref Range Status   MRSA by PCR NEGATIVE NEGATIVE Final    Comment:        The GeneXpert MRSA Assay (FDA approved for NASAL specimens only), is one component of a comprehensive MRSA colonization surveillance program. It is not intended to  diagnose MRSA infection nor to guide or monitor treatment for MRSA infections. Performed at [facility], [place] [address] [place],  [address]   Resp Panel by RT PCR (RSV, Flu A&B, Covid) - Nasopharyngeal Swab     Status: None   Collection Time: 11/21/19  7:08 PM   Specimen: Nasopharyngeal Swab  Result Value Ref Range Status   SARS Coronavirus 2 by RT PCR NEGATIVE NEGATIVE Final    Comment: (NOTE) SARS-CoV-2 target nucleic acids are NOT DETECTED.  The SARS-CoV-2 RNA is generally detectable in upper respiratoy specimens during the acute phase of infection. The lowest concentration of SARS-CoV-2 viral copies this assay can detect is 131 copies/mL. A negative result does not preclude SARS-Cov-2 infection and should not be used as the sole basis for treatment or other patient management decisions. A negative result may occur with  improper specimen collection/handling, submission of specimen other than nasopharyngeal swab, presence of viral mutation(s) within the areas targeted by this assay, and inadequate number of viral copies (<131 copies/mL). A negative result must be combined with clinical observations, patient history, and epidemiological information. The expected result is Negative.  Fact Sheet for Patients:  PinkCheek.be  Fact Sheet for Healthcare Providers:  GravelBags.it  This test is no t yet approved or cleared by the [place] FDA and  has been authorized for detection and/or diagnosis of SARS-CoV-2 by FDA under an Emergency Use Authorization (EUA). This EUA will remain  in effect (meaning this test can be used) for the duration of the COVID-19 declaration under Section 564(b)(1) of the Act, 21 U.S.C. section 360bbb-3(b)(1), unless the authorization is terminated or revoked sooner.     Influenza A by PCR NEGATIVE NEGATIVE Final   Influenza B by PCR NEGATIVE NEGATIVE Final     Comment: (NOTE) The Xpert Xpress SARS-CoV-2/FLU/RSV assay is intended as an aid in  the diagnosis of influenza from Nasopharyngeal swab specimens and  should not be used as a sole basis for treatment. Nasal washings and  aspirates are unacceptable for Xpert Xpress SARS-CoV-2/FLU/RSV  testing.  Fact Sheet for Patients: PinkCheek.be  Fact Sheet for Healthcare Providers: GravelBags.it  This test is not yet approved or cleared by the [place] FDA and  has been authorized for detection and/or diagnosis of SARS-CoV-2 by  FDA under an Emergency Use Authorization (EUA). This EUA will remain  in effect (meaning this test can be used) for the duration of the  Covid-19 declaration under Section  564(b)(1) of the Act, 21  U.S.C. section 360bbb-3(b)(1), unless the authorization is  terminated or revoked.    Respiratory Syncytial Virus by PCR NEGATIVE NEGATIVE Final    Comment: (NOTE) Fact Sheet for Patients: PinkCheek.be  Fact Sheet for Healthcare Providers: GravelBags.it  This test is not yet approved or cleared by the [place] FDA and  has been authorized for detection and/or diagnosis of SARS-CoV-2 by  FDA under an Emergency Use Authorization (EUA). This EUA will remain  in effect (meaning this test can be used) for the duration of the  COVID-19 declaration under Section 564(b)(1) of the Act, 21 U.S.C.  section 360bbb-3(b)(1), unless the authorization is terminated or  revoked. Performed at [facility], [place] [address], [place], [place] [address]     Procedures and diagnostic studies:  DG CHEST PORT 1 VIEW  Result Date: 11/22/2019 CLINICAL DATA:  Dyspnea. EXAM: PORTABLE CHEST 1 VIEW COMPARISON:  November 21, 2019. FINDINGS: The heart size and mediastinal contours are within normal limits. No pneumothorax or pleural effusion is noted. Stable  findings consistent with bilateral hilar retraction. Right apical pleural thickening is noted which is unchanged. No acute pulmonary disease is noted. The visualized skeletal structures are unremarkable. IMPRESSION: No active disease. Electronically Signed   By: Marijo Conception M.D.   On: 11/22/2019 19:57   ECHOCARDIOGRAM COMPLETE  Result Date: 11/22/2019    ECHOCARDIOGRAM REPORT   Patient Name:   KOLE HILYARD Date of Exam: 11/22/2019 Medical Rec #:  [id number]       Height:       65.0 in Accession #:    [id number]      Weight:       106.0 lb Date of Birth:  28-Dec-1950       BSA:          1.510 m Patient Age:    69 years        BP:           117/76 mmHg Patient Gender: M               HR:           106 bpm. Exam Location:  Inpatient Procedure: 2D Echo, Cardiac Doppler and Color Doppler Indications:    Stroke 434.91 / I163.9  History:        Patient has no prior history of Echocardiogram examinations.                 COPD; Risk Factors:Current Smoker and Dyslipidemia.  Sonographer:    Bernadene Person RDCS Referring Phys: [facility]  1. Left ventricular ejection fraction, by estimation, is 55 to 60%. The left ventricle has normal function. The left ventricle has no regional wall motion abnormalities. Left ventricular diastolic parameters are consistent with Grade I diastolic dysfunction (impaired relaxation). Elevated left atrial pressure.  2. Right ventricular systolic function is normal. The right ventricular size is normal.  3. The mitral valve is normal in structure. Trivial mitral valve regurgitation. No evidence of mitral stenosis.  4. The aortic valve is normal in structure. Aortic valve regurgitation is not visualized. No aortic stenosis is present.  5. The inferior vena cava is normal in size with greater than 50% respiratory variability, suggesting right atrial pressure of 3 mmHg. Comparison(s): No prior Echocardiogram. FINDINGS  Left Ventricle: Left ventricular ejection  fraction, by estimation, is 55 to 60%. The left ventricle has normal function. The left ventricle has no regional  wall motion abnormalities. The left ventricular internal cavity size was normal in size. There is  no left ventricular hypertrophy. Left ventricular diastolic parameters are consistent with Grade I diastolic dysfunction (impaired relaxation). Elevated left atrial pressure. Right Ventricle: The right ventricular size is normal. No increase in right ventricular wall thickness. Right ventricular systolic function is normal. Left Atrium: Left atrial size was normal in size. Right Atrium: Right atrial size was normal in size. Pericardium: There is no evidence of pericardial effusion. Mitral Valve: The mitral valve is normal in structure. Trivial mitral valve regurgitation. No evidence of mitral valve stenosis. Tricuspid Valve: The tricuspid valve is normal in structure. Tricuspid valve regurgitation is trivial. No evidence of tricuspid stenosis. Aortic Valve: The aortic valve is normal in structure. Aortic valve regurgitation is not visualized. No aortic stenosis is present. Pulmonic Valve: The pulmonic valve was normal in structure. Pulmonic valve regurgitation is not visualized. No evidence of pulmonic stenosis. Aorta: The aortic root is normal in size and structure. Venous: The inferior vena cava is normal in size with greater than 50% respiratory variability, suggesting right atrial pressure of 3 mmHg. IAS/Shunts: No atrial level shunt detected by color flow Doppler.  LEFT VENTRICLE PLAX 2D LVIDd:         3.70 cm  Diastology LVIDs:         2.10 cm  LV e' medial:    3.92 cm/s LV PW:         0.80 cm  LV E/e' medial:  14.5 LV IVS:        0.80 cm  LV e' lateral:   9.67 cm/s LVOT diam:     2.10 cm  LV E/e' lateral: 5.9 LV SV:         68 LV SV Index:   45 LVOT Area:     3.46 cm  RIGHT VENTRICLE RV S prime:     12.00 cm/s TAPSE (M-mode): 1.9 cm LEFT ATRIUM             Index      RIGHT ATRIUM          Index LA  diam:        1.90 cm 1.26 cm/m RA Area:     7.97 cm LA Vol (A2C):   12.0 ml 7.95 ml/m RA Volume:   14.60 ml 9.67 ml/m LA Vol (A4C):   12.9 ml 8.54 ml/m LA Biplane Vol: 12.6 ml 8.34 ml/m  AORTIC VALVE LVOT Vmax:   108.00 cm/s LVOT Vmean:  70.900 cm/s LVOT VTI:    0.196 m  AORTA Ao Root diam: 3.50 cm MITRAL VALVE MV Area (PHT): 2.37 cm    SHUNTS MV Decel Time: 320 msec    Systemic VTI:  0.20 m MV E velocity: 56.90 cm/s  Systemic Diam: 2.10 cm MV A velocity: 67.20 cm/s MV E/A ratio:  0.85 Ena Dawley MD Electronically signed by Ena Dawley MD Signature Date/Time: 11/22/2019/6:53:37 PM    Final     Medications:   .  stroke: mapping our early stages of recovery book   Does not apply Once  . aspirin EC  81 mg Oral Daily  . atorvastatin  80 mg Oral Daily  . budesonide  0.5 mg Nebulization BID  . Chlorhexidine Gluconate Cloth  6 each Topical Daily  . enoxaparin (LOVENOX) injection  40 mg Subcutaneous Q24H  . guaiFENesin  600 mg Oral BID  . nicotine  14 mg Transdermal Daily  . pantoprazole (PROTONIX) IV  40  mg Intravenous Q12H  . tamsulosin  0.4 mg Oral QHS  . ticagrelor  90 mg Oral BID   Continuous Infusions:    LOS: 3 days   Geradine Girt  [facility]   How to contact the [facility] Attending or Consulting provider [place] or covering provider during after hours [place], for this patient?  1. Check the care team in [facility] and look for a) attending/consulting TR[facility] provider listed and b) the [facility] team listed 2. Log into [email or web address] and use [place]'s universal password to access. If you do not have the password, please contact the hospital operator. 3. Locate the [facility] provider you are looking for under [facility] and page to a number that you can be directly reached. 4. If you still have difficulty reaching the provider, please page the [facility] (Director on Call) for the Hospitalists listed on amion for assistance.  11/24/2019, 3:03 PM

## 2019-11-24 NOTE — Progress Notes (Signed)
STROKE TEAM PROGRESS NOTE   INTERVAL HISTORY Wife at bedside. States patient is very anxious. Discussed with nurse, Dr. Eliseo Squires aware and orders placed. Otherwise stable, pending stenting.  Vitals:   11/23/19 2010 11/24/19 0427 11/24/19 0807 11/24/19 0843  BP:  (!) 139/94 106/81   Pulse:  (!) 107 (!) 103   Resp:  19 18   Temp:  97.7 F (36.5 C) (!) 97.5 F (36.4 C)   TempSrc:  Oral Oral   SpO2: 97% 98% 100% 100%  Weight:      Height:       CBC:  Recent Labs  Lab 11/21/19 1855 11/21/19 1905 11/22/19 0347 11/23/19 0225  WBC 11.8*   < > 10.2 11.9*  NEUTROABS 10.4*  --   --  10.7*  HGB 12.7*   < > 10.8* 10.8*  HCT 40.5   < > 32.7* 33.6*  MCV 87.3   < > 84.5 83.2  PLT 421*   < > 397 425*   < > = values in this interval not displayed.   Basic Metabolic Panel:  Recent Labs  Lab 11/18/19 0453 11/18/19 0453 11/19/19 0431 11/19/19 0431 11/21/19 1855 11/21/19 1905 11/21/19 2115 11/21/19 2115 11/22/19 0347 11/23/19 0225  NA 136   < > 138   < > 131*   < > 138  --   --  135  K 4.1   < > 3.8   < > 8.3*   < > 3.9  --   --  3.7  CL 103   < > 101   < > 99   < > 103  --   --  100  CO2 23   < > 26   < > 21*  --   --   --   --  23  GLUCOSE 166*   < > 161*   < > 120*   < > 115*  --   --  125*  BUN 18   < > 18   < > 18   < > 20  --   --  14  CREATININE 0.62   < > 0.62   < > 1.00   < > 0.60*   < > 0.52* 0.70  CALCIUM 8.9   < > 8.8*   < > 8.2*  --   --   --   --  8.6*  MG 2.2  --  2.2  --   --   --   --   --   --   --   PHOS 2.1*  --  2.4*  --   --   --   --   --   --   --    < > = values in this interval not displayed.   Lipid Panel:  Recent Labs  Lab 11/22/19 0347  CHOL 232*  TRIG 54  HDL 67  CHOLHDL 3.5  VLDL 11  LDLCALC 154*   HgbA1c:  Recent Labs  Lab 11/22/19 0347  HGBA1C 6.3*   Urine Drug Screen: No results for input(s): LABOPIA, COCAINSCRNUR, LABBENZ, AMPHETMU, THCU, LABBARB in the last 168 hours.  Alcohol Level No results for input(s): ETH in the last 168  hours.    IMAGING past 24 hours No results found.    PHYSICAL EXAM stable  Temp:  [97.4 F (36.3 C)-97.7 F (36.5 C)] 97.5 F (36.4 C) (10/23 0807) Pulse Rate:  [85-112] 103 (10/23 0807) Resp:  [12-20] 18 (10/23 4235)  BP: (89-147)/(58-98) 106/81 (10/23 0807) SpO2:  [95 %-100 %] 100 % (10/23 0843) FiO2 (%):  [21 %] 21 % (10/22 2010)  General - Well nourished, well developed, in no apparent distress.  Ophthalmologic - fundi not visualized due to noncooperation.  Cardiovascular - Regular rhythm and rate.  Respiratory - b/l Rhonchi, secretion in throat with weak cough.  Neuro stable - awake alert, orientated x3.  No aphasia, no dysarthria, weak cough, able to name and repeat.  Follows simple commands.  No gaze preference, visual field fall.  No significant facial droop, tongue midline.  Left upper extremity 3+/5 deltoid, 3/5 bicep, 2+/5 tricep and finger grip.  Right upper extremity at least 4/5.  Bilateral lower extremity at least 4/5 with right BKA.  Sensation symmetrical.  Finger-to-nose intact on the right.  Gait not tested.   ASSESSMENT/PLAN [name] is a 69 y.o. male with history of recurrent non small cell lung cancer (stage IIIa status post chemo and radiation), traumatic right knee BKA, recent recurrent pneumonia, left renal mass, known prior bilateral carotid artery stenosis, COPD w/ recent exacerbation d/c 10/19. He has had generalized weakness and poor sleep, chronic swallowing, wt loss with poor apptetite and his son and dtr recently moved in to help him. Plans for prostate bx for which he had stopped his aspirin. On 10/20 presented to [facility] ED with difficulty w/ spatial awareness and vision guided movements, HA, L sided weakness. Transfered to [employer]. [facility].  Stroke: Left temporooccipital infarct and R>L watershed infarcts in setting of known B CCA stenosis secondary to radiation  CT head small L parietal hypodensity  MRI  L  temporooccipital infarct. Additional scattered cortical + subcortical B watershed infarcts. Min petechial hemorrhage in L temporooccipital infarct. Old tiny L cerebellar infarct  CTA head & neck no LVO. 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 suspicion for L IJ thrombosis. B ICA siphon w/ mild stenosis. LLL opacity.   MRA head  Unremarkable   Cerebral angio confirmed bilateral CCA high-grade stenosis  2D Echo EF 55 to 60%  LDL 154   HgbA1c 6.3  VTE prophylaxis - Lovenox 40 mg sq daily   off aspirin for planned prostate bx prior to admission, now on aspirin 81 mg daily and brilinta 90 bid.   Therapy recommendations:  pending   Disposition:  pending   Respiratory Distress / Agitation  Increased WOB 10/21 evening  Put on BIPAP w/ relief  Changed level to progressive care   Treated w/ ativan and haldol. Good relief w/ haldol  Carotid Stenosis   CTA neck 02/2017 Long segment of severe 80% right and severe 90% left proximal  common carotid artery stenosis.  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 Jeralyn Ruths in radiology  CTA neck suspicion for L IJ  thrombosis, ? 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 Avoid low BP - BP still low at times. Bolus 500 cc's on 10/22 with Albumin. May need further fluids. No apparent CHF history. Pt does not have continuous IV fluids at this time. . Long-term BP goal 130-150 give carotid stenosis   Hyperlipidemia  Home meds:  No statin  Now on lipitor 80  LDL 154, goal < 70  Continue statin at discharge  Non Small cell lung CA  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   recurrence on the left side presented as a stage IIIa non-small cell lung cancer, squamous cell carcinoma status post concurrent chemoradiation followed by consolidation chemotherapy. Completed in March 2017.  He also underwent SBRT to the right middle lobe lung nodule under the care of Dr. [name] completed on 07/24/2018.  He also underwent SBRT to left lower lobe lung nodule under the care of Dr. [name] completed on April 27, 2019.  Recent scan showed no concerning findings for disease progression  Followed with Dr. Julien Nordmann  Other Stroke Risk Factors  Advanced age  Cigarette smoker, quit 7 mos ago, advised to stop smoking  Other Active Problems  Recent COPD exacerbation, d/c 10/19  Recent recurrent PNA  Hematuria with urinary retention. Foley.   Marked enlargement of the Prostate w/ plans for bx    Cachexia, Body mass index is 17.64 kg/m.   Traumatic right [facility] day # 3 Dr. Erlinda Hong had long discussion with son and daughter at bedside, updated pt current condition,  treatment plan and potential prognosis, and answered all the questions.  They expressed understanding and appreciation.  Dr. Erlinda Hong also discussed with Dr. Estanislado Pandy.  Personally examined patient and images, and have participated in and made any corrections needed to history, physical, neuro exam,assessment and plan as stated above.  I have personally obtained the history, evaluated lab date, reviewed imaging studies and agree with radiology interpretations.    Sarina Ill, MD Stroke Neurology  I spent 25 minutes of face-to-face and non-face-to-face time with patient. This included prechart review, lab review, study review, order entry, electronic health record documentation, patient education on the different diagnostic and therapeutic options, counseling and coordination of care, risks and benefits of management, compliance, or risk factor reduction     To contact Stroke Continuity provider, please refer to [email or web address] After hours, contact General Neurology

## 2019-11-24 NOTE — Progress Notes (Signed)
   11/24/19 1249  Assess: MEWS Score  Temp 98.3 F (36.8 C)  BP 100/73  Pulse Rate (!) 105  Resp 20  Level of Consciousness Alert  SpO2 100 %  O2 Device Nasal Cannula  O2 Flow Rate (L/min) 2 L/min  Assess: MEWS Score  MEWS Temp 0  MEWS Systolic 1  MEWS Pulse 1  MEWS RR 0  MEWS LOC 0  MEWS Score 2  MEWS Score Color Yellow  Assess: if the MEWS score is Yellow or Red  Were vital signs taken at a resting state? Yes  Focused Assessment No change from prior assessment  Early Detection of Sepsis Score *See Row Information* Low  MEWS guidelines implemented *See Row Information* No, previously yellow, continue vital signs every 4 hours  Treat  MEWS Interventions Administered scheduled meds/treatments;Administered prn meds/treatments  Pain Scale 0-10  Pain Score 0

## 2019-11-24 NOTE — Progress Notes (Signed)
Per MD ok for family to stay and rotate between brother and sister to help lower anxiety.

## 2019-11-24 NOTE — Progress Notes (Signed)
Pt refused to wear BIPAP tonight unit is SB, Nurse is aware that if pt changes his mind let me know.

## 2019-11-24 NOTE — Evaluation (Signed)
Physical Therapy Evaluation Patient Details Name: Evan [name] MRN: [id number] DOB: Sep 22, 1950 Today's Date: 11/24/2019   History of Present Illness  69 y.o. male with medical history significant of lung cancer apparently stage IIIa status post chemo now in remission, recent hospitalization with recurrent pneumonia, COPD, depression, left renal mass, anxiety disorder, and traumatic R BKA, presents with confusion, weakness, and mild aphasia. CT and MRI demonstrate new left parieto-occipital infarct. On 11/22/2019 pt developing agitation and respiratory distress requiring BiPAP and haldol. Pt underwent bilateral cerebral arteriogram on 11/23/2019 demonstrating severe bilateral common carotid artery stenosis at proximal/mid levels.  Clinical Impression  Pt presents to PT with deficits in functional mobility, gait, balance, strength, power, endurance, sensation, and cognition. Pt is weak on L side and with prior R BKA is now at a high falls risk. Pt with very poor activity tolerance this session, appearing to have a difficult time breathing and with audile wet respiratory sounds. PT requires significant assistance for all functional mobility at this time and will benefit from PT POC to improve mobility quality and to reduce falls risk. PT recommends SNF placement at this time.    Follow Up Recommendations SNF;Supervision/Assistance - 24 hour    Equipment Recommendations   (mechanical lift if home today)    Recommendations for Other Services       Precautions / Restrictions Precautions Precautions: Fall Precaution Comments: Rt BKA (prosthesis in room) Restrictions Weight Bearing Restrictions: No      Mobility  Bed Mobility Overal bed mobility: Needs Assistance Bed Mobility: Supine to Sit     Supine to sit: HOB elevated;Max assist     General bed mobility comments: assist for LE management, balance,and to pivot hips to edge of bed    Transfers Overall transfer level: Needs  assistance Equipment used: 1 person hand held assist Transfers: Sit to/from Stand;Stand Pivot Transfers Sit to Stand: Max assist Stand pivot transfers: Max assist       General transfer comment: PT provides L knee block and BUE support, PT initiates standing and pivoting  Ambulation/Gait                Stairs            Wheelchair Mobility    Modified Rankin (Stroke Patients Only) Modified Rankin (Stroke Patients Only) Pre-Morbid Rankin Score: Slight disability Modified Rankin: Severe disability     Balance Overall balance assessment: Needs assistance Sitting-balance support: Single extremity supported;Feet supported Sitting balance-Leahy Scale: Poor Sitting balance - Comments: minG-minA at edge of bed with RUE support   Standing balance support: Bilateral upper extremity supported Standing balance-Leahy Scale: Zero Standing balance comment: maxA with BUE support of PT                             Pertinent Vitals/Pain Pain Assessment: Faces Faces Pain Scale: Hurts little more Pain Location: generalized Pain Descriptors / Indicators: Grimacing Pain Intervention(s): Monitored during session    Home Living Family/patient expects to be discharged to:: Private residence Living Arrangements: Alone;Children (son during day, dtr at night recently) Available Help at Discharge: Family;Available 24 hours/day Type of Home: Apartment Home Access: Stairs to enter Entrance Stairs-Rails: None Entrance Stairs-Number of Steps: down 2, up 1 and 1 Home Layout: One level Home Equipment: Cane - single point;Grab bars - tub/shower;Wheelchair - [employer] - 2 wheels;Tub bench      Prior Function Level of Independence: Independent with assistive device(s)  Comments: pt typically mobilizes in wheelchair at home and uses prosthetic and cane for limited community ambulation. Family has been staying with patient since admission in september     Hand  Dominance   Dominant Hand: Right    Extremity/Trunk Assessment   Upper Extremity Assessment Upper Extremity Assessment: LUE deficits/detail LUE Deficits / Details: shoulder flexion 2-/5, elbow flexion 4-/5, elbow extension 2/5, grip 3/5 LUE Sensation: decreased light touch    Lower Extremity Assessment Lower Extremity Assessment: LLE deficits/detail;RLE deficits/detail RLE Deficits / Details: R BKA, strength WFL LLE Deficits / Details: grossly 3+/5 LLE Sensation: decreased light touch    Cervical / Trunk Assessment Cervical / Trunk Assessment: Kyphotic  Communication   Communication: Other (comment) (short of breath limiting conversation)  Cognition Arousal/Alertness: Awake/alert Behavior During Therapy: Restless Overall Cognitive Status: Impaired/Different from baseline Area of Impairment: Orientation;Attention;Memory;Following commands;Safety/judgement;Awareness;Problem solving                 Orientation Level: Disoriented to;Time;Situation (knows year and month, not day) Current Attention Level: Sustained Memory: Decreased recall of precautions;Decreased short-term memory Following Commands: Follows one step commands with increased time Safety/Judgement: Decreased awareness of safety;Decreased awareness of deficits Awareness: Intellectual Problem Solving: Slow processing        General Comments General comments (skin integrity, edema, etc.): pt on 2L [place] for comfort per RN, HR elevated into 130s, wet respiratory sounds with each breath, pt unable to converse without stopping to catch his breath, sats in low to mid-90s    Exercises Other Exercises Other Exercises: reviewed use of incentive spirometer   Assessment/Plan    PT Assessment Patient needs continued PT services  PT Problem List Decreased strength;Decreased range of motion;Decreased activity tolerance;Decreased balance;Decreased mobility;Decreased cognition;Decreased knowledge of use of DME;Decreased  knowledge of precautions;Decreased safety awareness;Cardiopulmonary status limiting activity;Impaired sensation       PT Treatment Interventions DME instruction;Gait training;Stair training;Functional mobility training;Therapeutic activities;Therapeutic exercise;Balance training;Neuromuscular re-education;Patient/family education;Wheelchair mobility training;Cognitive remediation    PT Goals (Current goals can be found in the Care Plan section)  Acute Rehab PT Goals Patient Stated Goal: to improve mobility and activity tolerance PT Goal Formulation: With patient/family Time For Goal Achievement: 12/08/19 Potential to Achieve Goals: Fair    Frequency Min 3X/week   Barriers to discharge        Co-evaluation               AM-PAC PT "6 Clicks" Mobility  Outcome Measure Help needed turning from your back to your side while in a flat bed without using bedrails?: A Lot Help needed moving from lying on your back to sitting on the side of a flat bed without using bedrails?: Total Help needed moving to and from a bed to a chair (including a wheelchair)?: Total Help needed standing up from a chair using your arms (e.g., wheelchair or bedside chair)?: Total Help needed to walk in hospital room?: Total Help needed climbing 3-5 steps with a railing? : Total 6 Click Score: 7    End of Session Equipment Utilized During Treatment: Oxygen Activity Tolerance: Patient limited by fatigue Patient left: with call bell/phone within reach;in chair;with chair alarm set;with family/visitor present Nurse Communication: Mobility status;Need for lift equipment (STEDY) PT Visit Diagnosis: Other abnormalities of gait and mobility (R26.89);Unsteadiness on feet (R26.81);Muscle weakness (generalized) (M62.81);Other symptoms and signs involving the nervous system (R29.898);Hemiplegia and hemiparesis Hemiplegia - Right/Left: Left Hemiplegia - dominant/non-dominant: Non-dominant Hemiplegia - caused by:  Cerebral infarction    Time: 0962-8366 PT Time Calculation (  min) (ACUTE ONLY): 34 min   Charges:   PT Evaluation $PT Eval Moderate Complexity: 1 Mod PT Treatments $Therapeutic Activity: 8-22 mins        Zenaida Niece, PT, DPT Acute Rehabilitation Pager: [phone]   Zenaida Niece 11/24/2019, 2:21 PM

## 2019-11-24 NOTE — Evaluation (Addendum)
Clinical/Bedside Swallow Evaluation Patient Details  Name: Evan [name] MRN: [id number] Date of Birth: 1950-05-19  Today's Date: 11/24/2019 Time: SLP Start Time (ACUTE ONLY): 1450 SLP Stop Time (ACUTE ONLY): 1520 SLP Time Calculation (min) (ACUTE ONLY): 30 min  Past Medical History:  Past Medical History:  Diagnosis Date   Anxiety    Complication of anesthesia    " sometimes I wake up during surgery "   Depression    Left renal mass 07/19/2016   Lung cancer ([place]) dx'd 2010    NSCL CA right   Lung cancer ([place]) dx'd 2017   left   Past Surgical History:  Past Surgical History:  Procedure Laterality Date   AMPUTATION Right 05/21/2012   Procedure: AMPUTATION BELOW KNEE;  Surgeon: Newt Minion, MD;  Location: [place];  Service: Orthopedics;  Laterality: Right;   AMPUTATION Right 06/21/2012   Procedure: Revision AMPUTATION BELOW KNEE Right;  Surgeon: Newt Minion, MD;  Location: [place];  Service: Orthopedics;  Laterality: Right;  Revision right Below Knee Amputation   COLONOSCOPY W/ BIOPSIES AND POLYPECTOMY     FOOT AMPUTATION Right    traumatic right lower extremity   UPPER JAW     SURGERY FOR INFECTION   HPI:  Evan [name] is a 69 y.o. 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 Now with worsening dysphagia over past 24-48 hours.   Assessment / Plan / Recommendation Clinical Impression  Mr. Sgro demonstrates severe s/s aspiration across consistencies. He initially passed nursing dysphagia screening 11/22/19 and was eating upon arrival to hospital without difficulty, but over the past 24 hours has had  an acute decline in tolerance. Oral mech exam revealed xerostomia and inability to produce adequate volitional cough (unable to effectively coordinate breathing and vocal fold adduction). Voice is also notably low in intensity. Son was present and reports "he was drinking soda and eating a sandwich when he got here, but since yesterday he's been like this" (referring to severe coughing after water). Most recent CXR is from 11/22/19, which was WNL, but was also completed prior to his worsened dysphagia. He was noted with elevated respiratory rate with PO intake (up to 34 after thin liquid intake), requiring cues for breathing in through nose and out through mouth. He took over a minute to recover back to baseline RR of roughly 20. Pt had severe cough response with thin liquid trials via cup. With single small ice chip via spoon, pt had no difficulty. With purees and nectar thick liquids, pt was noted with consistent cough response, though not as aggressive/severe as with thin liquids. Overall, pt is at high risk for aspiration and is to remain NPO pending results of modified barium swallow study.  Pt may have single ice chips for comfort after oral care pending results of study.    SLP Visit Diagnosis: Dysphagia, pharyngeal phase (R13.13)    Aspiration Risk  Moderate aspiration risk    Diet Recommendation Ice chips PRN after oral care;NPO   Medication Administration: Via alternative means Supervision: Staff to assist with self feeding    Other  Recommendations Recommended Consults: Consider ENT evaluation Oral Care Recommendations: Oral care prior to ice chip/H20;Oral care QID;Staff/trained caregiver  to provide oral care   Frequency and Duration min 2x/week          Prognosis Prognosis for Safe Diet Advancement: Fair Barriers to Reach Goals: Severity of deficits      Swallow Study   General Date of Onset: 10/28/19 HPI: Evan [name] is a 69 y.o. male with medical history significant of  lung cancer apparently stage IIIa status post chemo now in remission, recent hospitalization for recurrent pneumonia, COPD, depression, left renal mass, anxiety disorder, traumatic amputation of the right below the knee who was recently hospitalized with a recurrent pneumonia, presented with significant confusion weakness. 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 Now with worsening dysphagia over past 24-48 hours. Type of Study: Bedside Swallow Evaluation Previous Swallow Assessment: n/a Diet Prior to this Study: NPO Temperature Spikes Noted: No Respiratory Status: Nasal cannula History of Recent Intubation: No Behavior/Cognition: Alert;Cooperative Oral Cavity Assessment: Dry Oral Care Completed by SLP: Yes Oral Cavity - Dentition: Missing dentition Self-Feeding Abilities: Needs assist Patient Positioning: Upright in bed Baseline Vocal Quality: Low vocal intensity Volitional Cough: Weak    Oral/Motor/Sensory Function Overall Oral Motor/Sensory Function: Within functional limits   Ice Chips Ice chips: Within functional limits Presentation: Spoon   Thin Liquid Thin Liquid: Impaired Presentation: Cup Pharyngeal  Phase Impairments: Cough - Immediate;Change in Vital Signs Other Comments: increased RR, prolonged time required for recovery, cues needed to breath in through nose    Nectar Thick Nectar Thick Liquid: Impaired Presentation: Cup Pharyngeal Phase Impairments: Cough - Immediate;Wet Vocal Quality;Change in Vital Signs   Honey Thick Honey Thick Liquid: Not tested   Puree Puree: Impaired Presentation: Self Fed Pharyngeal Phase Impairments: Cough - Delayed;Change in Vital Signs   Solid     Solid: Not tested     Harshita Bernales P. Lyncoln Ledgerwood, M.S., [place] Pathologist Acute Rehabilitation Services Pager: [place] 11/24/2019,3:28 PM

## 2019-11-25 ENCOUNTER — Inpatient Hospital Stay (HOSPITAL_COMMUNITY): Payer: Medicare PPO

## 2019-11-25 ENCOUNTER — Other Ambulatory Visit: Payer: Self-pay

## 2019-11-25 DIAGNOSIS — I639 Cerebral infarction, unspecified: Secondary | ICD-10-CM | POA: Diagnosis not present

## 2019-11-25 DIAGNOSIS — I10 Essential (primary) hypertension: Secondary | ICD-10-CM | POA: Diagnosis not present

## 2019-11-25 LAB — CBC
HCT: 36 % — ABNORMAL LOW (ref 39.0–52.0)
Hemoglobin: 11.7 g/dL — ABNORMAL LOW (ref 13.0–17.0)
MCH: 26.8 pg (ref 26.0–34.0)
MCHC: 32.5 g/dL (ref 30.0–36.0)
MCV: 82.6 fL (ref 80.0–100.0)
Platelets: 490 10*3/uL — ABNORMAL HIGH (ref 150–400)
RBC: 4.36 MIL/uL (ref 4.22–5.81)
RDW: 16.6 % — ABNORMAL HIGH (ref 11.5–15.5)
WBC: 14.3 10*3/uL — ABNORMAL HIGH (ref 4.0–10.5)
nRBC: 0 % (ref 0.0–0.2)

## 2019-11-25 LAB — BASIC METABOLIC PANEL
Anion gap: 12 (ref 5–15)
BUN: 11 mg/dL (ref 8–23)
CO2: 22 mmol/L (ref 22–32)
Calcium: 8.7 mg/dL — ABNORMAL LOW (ref 8.9–10.3)
Chloride: 100 mmol/L (ref 98–111)
Creatinine, Ser: 0.69 mg/dL (ref 0.61–1.24)
GFR, Estimated: >60 mL/min (ref >60)
Glucose, Bld: 110 mg/dL — ABNORMAL HIGH (ref 70–99)
Potassium: 3.9 mmol/L (ref 3.5–5.1)
Sodium: 134 mmol/L — ABNORMAL LOW (ref 135–145)

## 2019-11-25 MED ORDER — HYDROXYZINE HCL 50 MG/ML IM SOLN
25.0000 mg | Freq: Four times a day (QID) | INTRAMUSCULAR | Status: DC | PRN
  Administered 2019-11-25 – 2019-11-27 (×3): 25 mg via INTRAMUSCULAR
  Filled 2019-11-25 (×5): qty 0.5

## 2019-11-25 MED ORDER — SODIUM CHLORIDE 0.9 % IV SOLN
3.0000 g | Freq: Three times a day (TID) | INTRAVENOUS | Status: AC
  Administered 2019-11-25 – 2019-12-02 (×21): 3 g via INTRAVENOUS
  Filled 2019-11-25: qty 3
  Filled 2019-11-25: qty 8
  Filled 2019-11-25: qty 3
  Filled 2019-11-25: qty 8
  Filled 2019-11-25 (×2): qty 3
  Filled 2019-11-25: qty 8
  Filled 2019-11-25 (×4): qty 3
  Filled 2019-11-25: qty 8
  Filled 2019-11-25: qty 3
  Filled 2019-11-25: qty 8
  Filled 2019-11-25: qty 3
  Filled 2019-11-25 (×7): qty 8
  Filled 2019-11-25: qty 3
  Filled 2019-11-25: qty 8

## 2019-11-25 MED ORDER — SODIUM CHLORIDE 0.9 % IV BOLUS
500.0000 mL | Freq: Once | INTRAVENOUS | Status: AC
  Administered 2019-11-25: 500 mL via INTRAVENOUS

## 2019-11-25 NOTE — TOC Initial Note (Signed)
Transition of Care [facility]) - Initial/Assessment Note    Patient Details  Name: Evan [name] MRN: [id number] Date of Birth: 06/30/1950  [facility]) CM/SW Contact:    Carles Collet, RN Phone Number: 11/25/2019, 12:46 PM  Clinical Narrative:                Damaris Schooner w patient's son at bedside. Patient asleep.  Son states that prior to admission, patient was able to ambulate around house and was "pretty independent" with assistance from R leg prothesis, cane. Also has RW, WC, 3/1, shower seat. Son states that patient lives home alone, but both he and his sister have been staying with him providing 24 hour supervision.  Son states that he suspects patient will end up going to SNF after this stay, but he and his sister need to discuss it more. TOC will continue to follow and provide support with either decision.  Patient was active w [facility] prior to admission.    Expected Discharge Plan: Skilled Nursing Facility Barriers to Discharge: Continued Medical Work up   Patient Goals and CMS Choice Patient states their goals for this hospitalization and ongoing recovery are:: unsure at this time   Choice offered to / list presented to : Adult Children  Expected Discharge Plan and Services Expected Discharge Plan: [place]                                              Prior Living Arrangements/Services                       Activities of Daily Living Home Assistive Devices/Equipment: Environmental consultant (specify type), Nebulizer, Eyeglasses (right leg prosthesis) ADL Screening (condition at time of admission) Patient's cognitive ability adequate to safely complete daily activities?: No Is the patient deaf or have difficulty hearing?: No Does the patient have difficulty seeing, even when wearing glasses/contacts?: No Does the patient have difficulty concentrating, remembering, or making decisions?: Yes Patient able to express need for assistance with ADLs?:  No Does the patient have difficulty dressing or bathing?: Yes Independently performs ADLs?: No Communication: Independent Dressing (OT): Needs assistance Is this a change from baseline?: Pre-admission baseline Grooming: Needs assistance Is this a change from baseline?: Pre-admission baseline Feeding: Independent Bathing: Needs assistance Is this a change from baseline?: Pre-admission baseline Toileting: Needs assistance Is this a change from baseline?: Pre-admission baseline In/Out Bed: Needs assistance Is this a change from baseline?: Pre-admission baseline Walks in Home: Needs assistance Is this a change from baseline?: Pre-admission baseline Does the patient have difficulty walking or climbing stairs?: Yes Weakness of Legs: Both Weakness of Arms/Hands: Both  Permission Sought/Granted                  Emotional Assessment              Admission diagnosis:  Venous thrombosis [I82.90] Stroke (cerebrum) ([place]) [I63.9] Acute cerebrovascular accident (CVA) ([place]) [I63.9] Acute stroke due to ischemia [facility]) [I63.9] Patient Active Problem List   Diagnosis Date Noted   Acute cerebrovascular accident (CVA) ([place]) 11/21/2019   Hyperkalemia 11/21/2019   Protein-calorie malnutrition, severe 11/17/2019   Acute exacerbation of chronic obstructive pulmonary disease (COPD) ([place]) 11/15/2019   Constipation 10/29/2019   Asymptomatic bacteriuria 10/29/2019   Pneumonia 10/28/2019   Primary cancer of left lower lobe of lung ([place]) 04/06/2019  Involuntary movements 08/24/2018   Primary cancer of right middle lobe of lung ([place]) 07/16/2018   Nodule of middle lobe of right lung 06/20/2018   Bilateral carotid artery stenosis 02/18/2017   Left renal mass 07/19/2016   Phlebitis 02/04/2015   Full code status 11/04/2014   Encounter for antineoplastic chemotherapy 11/04/2014   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, stage III - 2016 09/10/2014   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

## 2019-11-25 NOTE — Progress Notes (Signed)
Pharmacy Antibiotic Note  Evan [name] is a 69 y.o. male admitted on 11/21/2019 with concern for aspiration pneumonia.  Pharmacy has been consulted for Unasyn dosing.  Patient recently admitted and discharged on 11/20/19 after management of COPD exacerbation and pneumonia.  Completed hospital course of Levaquin.  Patient continues to have wet rhonchi. Concern for aspiration pneumonia.  Patient afebrile, WBC 14  Plan:  Unasyn 3g IV q8 hours  Height: 5\' 5"  (165.1 cm) Weight: 48.1 kg (106 lb) IBW/kg (Calculated) : 61.5  Temp (24hrs), Avg:97.8 F (36.6 C), Min:97.4 F (36.3 C), Max:98.4 F (36.9 C)  Recent Labs  Lab 11/19/19 0431 11/19/19 0431 11/21/19 1855 11/21/19 1855 11/21/19 1905 11/21/19 2115 11/22/19 0347 11/23/19 0225 11/25/19 0450  WBC 15.1*  --  11.8*  --   --   --  10.2 11.9* 14.3*  CREATININE 0.62   < > 1.00   < > 0.60* 0.60* 0.52* 0.70 0.69   < > = values in this interval not displayed.    Estimated Creatinine Clearance: 60.1 mL/min (by C-G formula based on SCr of 0.69 mg/dL).    Allergies  Allergen Reactions  . Chantix [Varenicline] Other (See Comments)    Weird dreams    Antimicrobials this admission: Unasyn 10/24 >>   Microbiology results: None this admit Prev admit respiratory culture from 10/17 normal flora  Thank you for allowing pharmacy to be a part of this patient's care.  Dimple Nanas, PharmD PGY-1 Acute Care Pharmacy Resident Office: [phone] 11/25/2019 2:36 PM

## 2019-11-25 NOTE — Progress Notes (Signed)
Patient refused to wear BIPAP again tonight. BIPAP SB. Pt said he did fine without it and will let the nurse know if he changes his mind.

## 2019-11-25 NOTE — Progress Notes (Signed)
Modified Barium Swallow Progress Note  Patient Details  Name: Evan [name] MRN: [id number] Date of Birth: 1950-03-07  Today's Date: 11/25/2019  Modified Barium Swallow completed.  Full report located under Chart Review in the Imaging Section.  Brief recommendations include the following:  Clinical Impression  Patient presents with a moderate oropharyngeal dysphagia without aspiration but with incident of very trace penetration during swallow with thin liquids. Oral phase is characterized by weak oral control and movement of boluses, mastication was decreased and resulted in prolonged oral phase with regular solids. He exhibited premature spillage of thin and nectar liquids, delay in swallow initiation to level of vallecular sinus with puree solids, and pyriform sinus with thin liquids. Inital swallows of puree solids, regular solids resulted in moderate vallecular residuals and mild pharyngeal residuals. Patient independently initiated swallow which helped clear residuals and sips of thin liquids helped clear majority of remaining residuals in pharynx.   Swallow Evaluation Recommendations       SLP Diet Recommendations: Thin liquid;Dysphagia 1 (Puree) solids   Liquid Administration via: Cup;Straw   Medication Administration: Whole meds with puree   Supervision: Patient able to self feed;Full supervision/cueing for compensatory strategies   Compensations: Minimize environmental distractions;Slow rate;Small sips/bites;Follow solids with liquid   Postural Changes: Remain semi-upright after after feeds/meals (Comment);Seated upright at 90 degrees   Oral Care Recommendations: Oral care BID      Sonia Baller, MA, CCC-SLP Speech Therapy [facility] Acute Rehab

## 2019-11-25 NOTE — Evaluation (Signed)
Occupational Therapy Evaluation Patient Details Name: Evan [name] MRN: [id number] DOB: 13-Feb-1950 Today's Date: 11/25/2019    History of Present Illness 69 y.o. male with medical history significant of lung cancer apparently stage IIIa status post chemo now in remission, recent hospitalization with recurrent pneumonia, COPD, depression, left renal mass, anxiety disorder, and traumatic R BKA, presents with confusion, weakness, and mild aphasia. CT and MRI demonstrate new left parieto-occipital infarct. On 11/22/2019 pt developing agitation and respiratory distress requiring BiPAP and haldol. Pt underwent bilateral cerebral arteriogram on 11/23/2019 demonstrating severe bilateral common carotid artery stenosis at proximal/mid levels.   Clinical Impression   This 69 y/o male presents with the above. PTA pt living at home with family, reports mostly using w/c for mobility purposes in the home, family assisting with ADL. Pt now presenting with notable weakness (increased L side weakness vs R), decreased activity tolerance and overall functional status. Pt with notable wet respiratory sounds but with productive cough while seated EOB. Pt requiring up to maxA for bed mobility, performing seated grooming ADL with minguard assist and requiring up to maxA for LB ADL. SpO2 98% on 2.5L O2 with max HR noted 122 bpm while seated EOB. Pt to benefit from continued acute OT services and recommend post acute rehab services to maximize his safety and independence with ADL and mobility prior to return home.     Follow Up Recommendations  SNF;Supervision/Assistance - 24 hour    Equipment Recommendations  3 in 1 bedside commode           Precautions / Restrictions Precautions Precautions: Fall Precaution Comments: Rt BKA (prosthesis in room) Restrictions Weight Bearing Restrictions: No      Mobility Bed Mobility Overal bed mobility: Needs Assistance Bed Mobility: Supine to Sit;Sit to Supine      Supine to sit: HOB elevated;Max assist Sit to supine: Mod assist   General bed mobility comments: assist for LE management, balance,and to pivot hips to edge of bed    Transfers                 General transfer comment: deferred, pt notably fatigued with sitting EOB and with increased WOB. pt also to go for swallow study shortly so deferred OOB     Balance Overall balance assessment: Needs assistance Sitting-balance support: Single extremity supported;Feet supported Sitting balance-Leahy Scale: Fair Sitting balance - Comments: able to maintain with minguard assist                                   ADL either performed or assessed with clinical judgement   ADL Overall ADL's : Needs assistance/impaired Eating/Feeding: NPO   Grooming: Wash/dry face;Supervision/safety;Sitting Grooming Details (indicate cue type and reason): seated EOB Upper Body Bathing: Minimal assistance;Sitting   Lower Body Bathing: Maximal assistance;Bed level   Upper Body Dressing : Moderate assistance;Sitting   Lower Body Dressing: Maximal assistance;Sitting/lateral leans;Bed level                 General ADL Comments: pt with limitations due to weakness, dyspnea with minimal activity and with audile, wet respiratory sounds     Vision   Additional Comments: pt tending to maintain gaze R when seated EOB - will benefit from further visual assessment      Perception     Praxis      Pertinent Vitals/Pain Pain Assessment: Faces Faces Pain Scale: Hurts little more Pain Location: generalized,  especially with coughing/sitting EOB Pain Descriptors / Indicators: Grimacing;Discomfort Pain Intervention(s): Repositioned;Monitored during session;Limited activity within patient's tolerance     Hand Dominance Right   Extremity/Trunk Assessment Upper Extremity Assessment Upper Extremity Assessment: LUE deficits/detail;Generalized weakness LUE Deficits / Details: weak grip  strength, grossly 2-/5 throughout remainder of UE LUE Sensation: decreased light touch LUE Coordination: decreased fine motor;decreased gross motor   Lower Extremity Assessment Lower Extremity Assessment: Defer to PT evaluation   Cervical / Trunk Assessment Cervical / Trunk Assessment: Kyphotic   Communication Communication Communication: Other (comment) (very soft spoken and dyspneic limiting conversation)   Cognition Arousal/Alertness: Awake/alert Behavior During Therapy: WFL for tasks assessed/performed;Flat affect Overall Cognitive Status: Impaired/Different from baseline Area of Impairment: Attention;Memory;Following commands;Safety/judgement;Awareness;Problem solving                   Current Attention Level: Sustained Memory: Decreased recall of precautions;Decreased short-term memory Following Commands: Follows one step commands with increased time Safety/Judgement: Decreased awareness of safety;Decreased awareness of deficits Awareness: Intellectual Problem Solving: Slow processing     General Comments       Exercises Exercises: Other exercises Other Exercises Other Exercises: flutter valve x10 Other Exercises: IS x5 (pulling to 200)   Shoulder Instructions      Home Living Family/patient expects to be discharged to:: Private residence Living Arrangements: Alone;Children (son during day, dtr at night recently) Available Help at Discharge: Family;Available 24 hours/day Type of Home: Apartment Home Access: Stairs to enter Entrance Stairs-Number of Steps: down 2, up 1 and 1 Entrance Stairs-Rails: None Home Layout: One level     Bathroom Shower/Tub: Teacher, early years/pre: Standard Bathroom Accessibility: No   Home Equipment: Cane - single point;Grab bars - tub/shower;Wheelchair - [employer] - 2 wheels;Tub bench          Prior Functioning/Environment Level of Independence: Needs assistance  Gait / Transfers Assistance Needed: pt  typically mobilizes in wheelchair at home and uses prosthetic and cane for limited community ambulation.  ADL's / Homemaking Assistance Needed: family assisting with ADL tasks   Comments: Family has been staying with patient since admission in september        OT Problem List: Decreased strength;Decreased range of motion;Decreased activity tolerance;Impaired balance (sitting and/or standing);Impaired vision/perception;Decreased cognition;Decreased safety awareness;Decreased knowledge of use of DME or AE;Cardiopulmonary status limiting activity;Impaired UE functional use;Decreased coordination      OT Treatment/Interventions: Self-care/ADL training;Therapeutic exercise;Energy conservation;DME and/or AE instruction;Therapeutic activities;Cognitive remediation/compensation;Visual/perceptual remediation/compensation;Patient/family education;Balance training;Splinting;Neuromuscular education    OT Goals(Current goals can be found in the care plan section) Acute Rehab OT Goals Patient Stated Goal: to improve mobility and activity tolerance OT Goal Formulation: With patient Time For Goal Achievement: 12/09/19 Potential to Achieve Goals: Good  OT Frequency: Min 2X/week   Barriers to D/C:            Co-evaluation              AM-PAC OT "6 Clicks" Daily Activity     Outcome Measure Help from another person eating meals?: Total (NPO) Help from another person taking care of personal grooming?: A Little Help from another person toileting, which includes using toliet, bedpan, or urinal?: Total Help from another person bathing (including washing, rinsing, drying)?: A Lot Help from another person to put on and taking off regular upper body clothing?: A Lot Help from another person to put on and taking off regular lower body clothing?: A Lot 6 Click Score: 11   End of Session Equipment Utilized  During Treatment: Oxygen Nurse Communication: Mobility status  Activity Tolerance: Patient  limited by fatigue Patient left: in bed;with call bell/phone within reach;with bed alarm set;with family/visitor present  OT Visit Diagnosis: Muscle weakness (generalized) (M62.81);Other abnormalities of gait and mobility (R26.89);Hemiplegia and hemiparesis Hemiplegia - Right/Left: Left Hemiplegia - dominant/non-dominant: Non-Dominant                Time: 7127-8718 OT Time Calculation (min): 28 min Charges:  OT General Charges $OT Visit: 1 Visit OT Evaluation $OT Eval Moderate Complexity: 1 Mod OT Treatments $Self Care/Home Management : 8-22 mins  Lou Cal, OT Acute Rehabilitation Services Pager [phone] Office [phone]  Raymondo Band 11/25/2019, 11:09 AM

## 2019-11-25 NOTE — Progress Notes (Signed)
STROKE TEAM PROGRESS NOTE   INTERVAL HISTORY  Son at bedside. I reviewed images with son, answered questions.   Vitals:   11/25/19 0351 11/25/19 0440 11/25/19 0640 11/25/19 0734  BP:  90/67 101/73 112/76  Pulse: (!) 118 (!) 101 85 (!) 103  Resp:  20 20 18   Temp:    98.4 F (36.9 C)  TempSrc:      SpO2:  100% 100% 100%  Weight:      Height:       CBC:  Recent Labs  Lab 11/21/19 1855 11/21/19 1905 11/23/19 0225 11/25/19 0450  WBC 11.8*   < > 11.9* 14.3*  NEUTROABS 10.4*  --  10.7*  --   HGB 12.7*   < > 10.8* 11.7*  HCT 40.5   < > 33.6* 36.0*  MCV 87.3   < > 83.2 82.6  PLT 421*   < > 425* 490*   < > = values in this interval not displayed.   Basic Metabolic Panel:  Recent Labs  Lab 11/19/19 0431 11/21/19 1855 11/23/19 0225 11/25/19 0450  NA 138   < > 135 134*  K 3.8   < > 3.7 3.9  CL 101   < > 100 100  CO2 26   < > 23 22  GLUCOSE 161*   < > 125* 110*  BUN 18   < > 14 11  CREATININE 0.62   < > 0.70 0.69  CALCIUM 8.8*   < > 8.6* 8.7*  MG 2.2  --   --   --   PHOS 2.4*  --   --   --    < > = values in this interval not displayed.   Lipid Panel:  Recent Labs  Lab 11/22/19 0347  CHOL 232*  TRIG 54  HDL 67  CHOLHDL 3.5  VLDL 11  LDLCALC 154*   HgbA1c:  Recent Labs  Lab 11/22/19 0347  HGBA1C 6.3*   Urine Drug Screen: No results for input(s): LABOPIA, COCAINSCRNUR, LABBENZ, AMPHETMU, THCU, LABBARB in the last 168 hours.  Alcohol Level No results for input(s): ETH in the last 168 hours.    IMAGING past 24 hours No results found.    PHYSICAL EXAM stable  Temp:  [97.4 F (36.3 C)-98.4 F (36.9 C)] 98.4 F (36.9 C) (10/24 0734) Pulse Rate:  [85-118] 103 (10/24 0734) Resp:  [18-20] 18 (10/24 0734) BP: (90-134)/(62-82) 112/76 (10/24 0734) SpO2:  [96 %-100 %] 100 % (10/24 0734) FiO2 (%):  [21 %] 21 % (10/23 2014)  General - Well nourished, well developed, in no apparent distress.  Ophthalmologic - fundi not visualized due to  noncooperation.  Cardiovascular - Regular rhythm and rate.  Respiratory - b/l Rhonchi, secretion in throat with weak cough.  Neuro stable - awake alert, orientated x3.  No aphasia, no dysarthria, weak cough, able to name and repeat.  Follows commands.  No gaze preference.  Mild left facial droop, tongue midline.  Left upper extremity 3+/5 deltoid, 3/5 bicep, 3/5 tricep and finger grip.  Right upper extremity at least 4/5.  Bilateral lower extremity at least 4/5 with right BKA.  Sensation symmetrical.  Finger-to-nose intact on the right.  Gait not tested.   ASSESSMENT/PLAN [name] is a 69 y.o. male with history of recurrent non small cell lung cancer (stage IIIa status post chemo and radiation), traumatic right knee BKA, recent recurrent pneumonia, left renal mass, known prior bilateral carotid artery stenosis, COPD w/ recent exacerbation  d/c 10/19. He has had generalized weakness and poor sleep, chronic swallowing, wt loss with poor apptetite and his son and dtr recently moved in to help him. Plans for prostate bx for which he had stopped his aspirin. On 10/20 presented to [facility] ED with difficulty w/ spatial awareness and vision guided movements, HA, L sided weakness. Transfered to [employer]. [facility].  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 Additional scattered cortical + subcortical B watershed infarcts. Min petechial hemorrhage in L temporooccipital infarct. Old tiny L cerebellar infarct  CTA head & neck no LVO. 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 suspicion for L IJ thrombosis. B ICA siphon w/ mild stenosis. LLL opacity.   MRA head  Unremarkable   Cerebral angio confirmed bilateral CCA high-grade stenosis  2D Echo EF 55 to 60%  LDL 154   HgbA1c 6.3  VTE prophylaxis -  Lovenox 40 mg sq daily   off aspirin for planned prostate bx prior to admission, now on aspirin 81 mg daily and brilinta 90 bid.   Therapy recommendations:  Evan [name] recommended by therapists  Disposition:  pending   Respiratory Distress / Agitation  Increased WOB 10/21 evening  Put on BIPAP w/ relief  Changed level to progressive care   Treated w/ ativan and haldol. Good relief w/ haldol  Carotid Stenosis   CTA neck 02/2017 Long segment of severe 80% right and severe 90% left proximal  common carotid artery stenosis.  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 Avoid low BP - BP still low at times. Bolus 500 cc's on  10/22 with Albumin. May need further fluids. No apparent CHF history. Pt does not have continuous IV fluids at this time. . Long-term BP goal 130-150 given carotid stenosis. Pt has Foley and on Flomax - will D/C Flomax to see if pressure improves. Also will give more fluids. SBP 90 - 112.  Hyperlipidemia  Home meds:  No statin  Now on lipitor 80  LDL 154, goal <  70  Continue statin at discharge  Non Small cell lung CA  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   recurrence on the left side presented as a stage IIIa non-small cell lung cancer, squamous cell carcinoma status post concurrent chemoradiation followed by consolidation chemotherapy. Completed in March 2017.  He also underwent SBRT to the right middle lobe lung nodule under the care of Dr. [name] completed on 07/24/2018.  He also underwent SBRT to left lower lobe lung nodule under the care of Dr. [name] completed on April 27, 2019.  Recent scan showed no concerning findings for disease progression  Followed with Dr. Julien Nordmann  Other Stroke Risk Factors  Advanced age  Cigarette smoker, quit 7 mos ago, advised to stop smoking  Other Active Problems  Recent COPD exacerbation, d/c 10/19  Recent recurrent PNA. CXR - pending  Hematuria with urinary retention. Foley. Flomax.  Marked enlargement of the Prostate w/ plans for bx    Cachexia, Body mass index is 17.64 kg/m.   Traumatic right BKA  Tachycardia - low grade  Hospital day # 4 I had discussion with son today and daughter yesterday at bedside, updated pt current condition, treatment plan and potential prognosis, and answered all the questions.  They expressed understanding and appreciation.  Dr. Erlinda Hong discussed with Dr. Estanislado Pandy last week.   Personally examined patient and images, and have participated in and made any corrections needed to  history, physical, neuro exam,assessment and plan as stated above.  I have personally obtained the history, evaluated lab date, reviewed imaging studies and agree with radiology interpretations.     I spent 25 minutes of face-to-face and non-face-to-face time with patient. This included prechart review, lab review, study review, order entry, electronic health record documentation, patient education on the different diagnostic and therapeutic options, counseling and coordination of care, risks and benefits of management, compliance, or risk factor reduction     To contact Stroke Continuity provider, please refer to [email or web address] After hours, contact General Neurology

## 2019-11-25 NOTE — Progress Notes (Signed)
Progress Note    Evan [name]  JAS:[id number] DOB: 1950-07-24  DOA: 11/21/2019 PCP: Patient, No Pcp Per    Brief Narrative:    Medical records reviewed and are as summarized below:  Evan [name] is an 69 y.o. 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  Found to have a CVA. Work up in process.  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   CT head showed a small area of low density in the left parietal lobe compatible with acute to subacute infarction.  MRI of the brain confirmed acute ischemic infarct involving the left temporaloccipital region.  Stroke work-up initiated.  Neurology following. PT/OT/speech evaluation pending -  LDL of 154-statin -  Hemoglobin A1c 6.3 Echo : Left ventricular ejection fraction, by estimation, is 55 to 60%. The  left ventricle has normal function. The left ventricle has no regional  wall motion abnormalities. Left ventricular diastolic parameters are  consistent with Grade I diastolic dysfunction (impaired relaxation). Elevated left atrial pressure. CTA head and neck did not show any large vessel occlusion but showed chronic Severe bilateral Common Carotid Artery stenosis in the lower neck,  likely post radiation related. Bilateral radiographic string signs, stable on the left but progressed on the right since last CTA study.  - IR consulted for cerebral angiogram: Plan for image-guided cerebral arteriogram with possible revascularization (angioplasty/stent placement) of right common carotid artery stenosis next week in IR- will determine exact date/time based on anesthesia availability.  Continue taking Brilinta 90 mg twice daily and Aspirin 81 mg once daily. Will check P2Y12 Monday AM.  Dysphagia/concern for aspiration/wet vocals -SLP eval -MBBS: DYS 1 diet -repeated x ray: possible aspiration PNA on right-- WBC trending up so will add IV Abx -? Need for ENT eval  History of lung cancer:  -Currently in remission.  Stage IIIa, treated with chemotherapy.  Follows with oncology, Dr. Julien Nordmann.  COPD: He was hospitalized recently and was discharged on 11/20/19 after management of COPD exacerbation, pneumonia.  He completed course of Levaquin and was discharged on tapering prednisone dose.  Continue bronchodilators.   -He continues to have wet rhonchi-- suspect upper airway -PRN bipap -nebs -pulm toilet  Hypertension: Allow permissive hypertension.   -BP on the low side -IVF support  History of smoking: Continue nicotine patch -encourage cessation  Hematuria -2 recent ER visits for say -will order cytology and continue to monitor -outpatient urology follow up in this smoker- Dr. Abner Greenspan  Family Communication/Anticipated D/C date and plan/Code Status   DVT prophylaxis: Lovenox ordered. Code Status: Full Code.  Family Communication: at bedside Disposition Plan: Status is: Inpatient  Remains inpatient appropriate because:Inpatient level of care appropriate due to severity of illness   Dispo: The patient is from: Home  Anticipated d/c is to: tbd- SNF?              Anticipated d/c date is: > 3 days              Patient currently is not medically stable to  d/c.         Medical Consultants:    Neurology  IR     Subjective:   No current complaints  Objective:    Vitals:   11/25/19 0440 11/25/19 0640 11/25/19 0734 11/25/19 1140  BP: 90/67 101/73 112/76 116/74  Pulse: (!) 101 85 (!) 103 (!) 102  Resp: 20 20 18 16   Temp:   98.4 F (36.9 C) (!) 97.4 F (36.3 C)  TempSrc:    Oral  SpO2: 100% 100% 100% 100%  Weight:      Height:       No intake or output data in the 24 hours ending 11/25/19 1415 Filed Weights   11/21/19 1819  Weight: 48.1 kg    Exam:  General: Appearance:    Ill appearing male in no acute distress     Lungs:     Rhonchi R>L b/l, respirations unlabored  Heart:    Tachycardic. Normal rhythm. No murmurs, rubs, or gallops.   MS:   Amputation of right foot is noted. Right AK amputation noted.   Neurologic:   Awake, alert    Data Reviewed:   I have personally reviewed following labs and imaging studies:  Labs: Labs show the following:   Basic Metabolic Panel: Recent Labs  Lab 11/19/19 0431 11/19/19 0431 11/21/19 1855 11/21/19 1855 11/21/19 1905 11/21/19 1905 11/21/19 2115 11/21/19 2115 11/22/19 0347 11/23/19 0225 11/25/19 0450  NA 138   < > 131*  --  139  --  138  --   --  135 134*  K 3.8   < > 8.3*   < > 4.3   < > 3.9   < >  --  3.7 3.9  CL 101   < > 99  --  106  --  103  --   --  100 100  CO2 26  --  21*  --   --   --   --   --   --  23 22  GLUCOSE 161*   < > 120*  --  122*  --  115*  --   --  125* 110*  BUN 18   < > 18  --  22  --  20  --   --  14 11  CREATININE 0.62   < > 1.00   < > 0.60*  --  0.60*  --  0.52* 0.70 0.69  CALCIUM 8.8*  --  8.2*  --   --   --   --   --   --  8.6* 8.7*  MG 2.2  --   --   --   --   --   --   --   --   --   --   PHOS 2.4*  --   --   --   --   --   --   --   --   --   --    < > = values in this interval not displayed.   GFR Estimated Creatinine Clearance: 60.1 mL/min (by C-G formula based on SCr of 0.69 mg/dL). Liver Function Tests: Recent Labs   Lab 11/21/19 1855  AST 71*  ALT  94*  ALKPHOS 89  BILITOT <0.1*  PROT 6.3*  ALBUMIN 2.9*   No results for input(s): LIPASE, AMYLASE in the last 168 hours. No results for input(s): AMMONIA in the last 168 hours. Coagulation profile Recent Labs  Lab 11/21/19 2158  INR 1.2    CBC: Recent Labs  Lab 11/19/19 0431 11/19/19 0431 11/21/19 1855 11/21/19 1855 11/21/19 1905 11/21/19 2115 11/22/19 0347 11/23/19 0225 11/25/19 0450  WBC 15.1*  --  11.8*  --   --   --  10.2 11.9* 14.3*  NEUTROABS  --   --  10.4*  --   --   --   --  10.7*  --   HGB 10.6*   < > 12.7*   < > 13.6 13.3 10.8* 10.8* 11.7*  HCT 32.7*   < > 40.5   < > 40.0 39.0 32.7* 33.6* 36.0*  MCV 83.8  --  87.3  --   --   --  84.5 83.2 82.6  PLT 543*  --  421*  --   --   --  397 425* 490*   < > = values in this interval not displayed.   Cardiac Enzymes: No results for input(s): CKTOTAL, CKMB, CKMBINDEX, TROPONINI in the last 168 hours. BNP (last 3 results) No results for input(s): PROBNP in the last 8760 hours. CBG: Recent Labs  Lab 11/19/19 2232 11/21/19 1841 11/22/19 1052  GLUCAP 160* 128* 92   D-Dimer: No results for input(s): DDIMER in the last 72 hours. Hgb A1c: No results for input(s): HGBA1C in the last 72 hours. Lipid Profile: No results for input(s): CHOL, HDL, LDLCALC, TRIG, CHOLHDL, LDLDIRECT in the last 72 hours. Thyroid function studies: No results for input(s): TSH, T4TOTAL, T3FREE, THYROIDAB in the last 72 hours.  Invalid input(s): FREET3 Anemia work up: No results for input(s): VITAMINB12, FOLATE, FERRITIN, TIBC, IRON, RETICCTPCT in the last 72 hours. Sepsis Labs: Recent Labs  Lab 11/21/19 1855 11/22/19 0347 11/23/19 0225 11/25/19 0450  WBC 11.8* 10.2 11.9* 14.3*    Microbiology Recent Results (from the past 240 hour(s))  Culture, Urine     Status: Abnormal   Collection Time: 11/15/19  4:15 PM   Specimen: Urine, Random  Result Value Ref Range Status   Specimen Description    Final    URINE, RANDOM Performed at [facility] [address], [place], [place] [address]    Special Requests   Final    NONE Performed at [facility], [place] [address] [place], [place] [address]    Culture >=100,000 COLONIES/mL YEAST (A)  Final   Report Status 11/17/2019 FINAL  Final  MRSA PCR Screening     Status: None   Collection Time: 11/15/19  4:15 PM   Specimen: Nasopharyngeal  Result Value Ref Range Status   MRSA by PCR NEGATIVE NEGATIVE Final    Comment:        The GeneXpert MRSA Assay (FDA approved for NASAL specimens only), is one component of a comprehensive MRSA colonization surveillance program. It is not intended to diagnose MRSA infection nor to guide or monitor treatment for MRSA infections. Performed at [facility], [place] [address], [place], [place] [address]   Resp Panel by RT PCR (RSV, Flu A&B, Covid) - Nasopharyngeal Swab     Status: None   Collection Time: 11/21/19  7:08 PM   Specimen: Nasopharyngeal Swab  Result Value Ref Range Status   SARS Coronavirus 2 by RT PCR NEGATIVE  NEGATIVE Final    Comment: (NOTE) SARS-CoV-2 target nucleic acids are NOT DETECTED.  The SARS-CoV-2 RNA is generally detectable in upper respiratoy specimens during the acute phase of infection. The lowest concentration of SARS-CoV-2 viral copies this assay can detect is 131 copies/mL. A negative result does not preclude SARS-Cov-2 infection and should not be used as the sole basis for treatment or other patient management decisions. A negative result may occur with  improper specimen collection/handling, submission of specimen other than nasopharyngeal swab, presence of viral mutation(s) within the areas targeted by this assay, and inadequate number of viral copies (<131 copies/mL). A negative result must be combined with clinical observations, patient history, and epidemiological information. The expected result is  Negative.  Fact Sheet for Patients:  PinkCheek.be  Fact Sheet for Healthcare Providers:  GravelBags.it  This test is no t yet approved or cleared by the [place] FDA and  has been authorized for detection and/or diagnosis of SARS-CoV-2 by FDA under an Emergency Use Authorization (EUA). This EUA will remain  in effect (meaning this test can be used) for the duration of the COVID-19 declaration under Section 564(b)(1) of the Act, 21 U.S.C. section 360bbb-3(b)(1), unless the authorization is terminated or revoked sooner.     Influenza A by PCR NEGATIVE NEGATIVE Final   Influenza B by PCR NEGATIVE NEGATIVE Final    Comment: (NOTE) The Xpert Xpress SARS-CoV-2/FLU/RSV assay is intended as an aid in  the diagnosis of influenza from Nasopharyngeal swab specimens and  should not be used as a sole basis for treatment. Nasal washings and  aspirates are unacceptable for Xpert Xpress SARS-CoV-2/FLU/RSV  testing.  Fact Sheet for Patients: PinkCheek.be  Fact Sheet for Healthcare Providers: GravelBags.it  This test is not yet approved or cleared by the [place] FDA and  has been authorized for detection and/or diagnosis of SARS-CoV-2 by  FDA under an Emergency Use Authorization (EUA). This EUA will remain  in effect (meaning this test can be used) for the duration of the  Covid-19 declaration under Section 564(b)(1) of the Act, 21  U.S.C. section 360bbb-3(b)(1), unless the authorization is  terminated or revoked.    Respiratory Syncytial Virus by PCR NEGATIVE NEGATIVE Final    Comment: (NOTE) Fact Sheet for Patients: PinkCheek.be  Fact Sheet for Healthcare Providers: GravelBags.it  This test is not yet approved or cleared by the [place] FDA and  has been authorized for detection and/or diagnosis of  SARS-CoV-2 by  FDA under an Emergency Use Authorization (EUA). This EUA will remain  in effect (meaning this test can be used) for the duration of the  COVID-19 declaration under Section 564(b)(1) of the Act, 21 U.S.C.  section 360bbb-3(b)(1), unless the authorization is terminated or  revoked. Performed at [facility], [place] [address] [place], [place] [address]     Procedures and diagnostic studies:  DG CHEST PORT 1 VIEW  Result Date: 11/25/2019 CLINICAL DATA:  Pneumonia, history of lung cancer. EXAM: PORTABLE CHEST 1 VIEW COMPARISON:  November 22, 2019. FINDINGS: Stable cardiomediastinal silhouette. No pneumothorax or pleural effusion is noted. Left lung is clear. Slightly increased right basilar opacity is noted concerning for possible atelectasis or infiltrate. Stable bilateral hilar findings consistent with retraction or fibrosis. Bony thorax is unremarkable. IMPRESSION: Slightly increased right basilar opacity is noted concerning for possible atelectasis or infiltrate. Electronically Signed   By: Marijo Conception M.D.   On: 11/25/2019 12:24   DG Swallowing Func-Speech Pathology  Result Date:  11/25/2019 Objective Swallowing Evaluation: Type of Study: MBS-Modified Barium Swallow Study  Patient Details Name: ARIHANT PENNINGS MRN: [id number] Date of Birth: Nov 28, 1950 Today's Date: 11/25/2019 Time: SLP Start Time (ACUTE ONLY): 1100 -SLP Stop Time (ACUTE ONLY): 1130 SLP Time Calculation (min) (ACUTE ONLY): 30 min Past Medical History: Past Medical History: Diagnosis Date . Anxiety  . Complication of anesthesia   " sometimes I wake up during surgery " . Depression  . Left renal mass 07/19/2016 . Lung cancer ([place]) dx'd 2010   NSCL CA right . Lung cancer ([place]) dx'd 2017  left Past Surgical History: Past Surgical History: Procedure Laterality Date . AMPUTATION Right 05/21/2012  Procedure: AMPUTATION BELOW KNEE;  Surgeon: Newt Minion, MD;  Location: [facility];  Service: Orthopedics;   Laterality: Right; . AMPUTATION Right 06/21/2012  Procedure: Revision AMPUTATION BELOW KNEE Right;  Surgeon: Newt Minion, MD;  Location: [facility];  Service: Orthopedics;  Laterality: Right;  Revision right Below Knee Amputation . COLONOSCOPY W/ BIOPSIES AND POLYPECTOMY   . FOOT AMPUTATION Right   traumatic right lower extremity . UPPER JAW    SURGERY FOR INFECTION HPI: ORVEL CUTSFORTH is a 69 y.o. 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 Now with worsening dysphagia over past 24-48 hours.  Subjective: alert but not very interactive Assessment / Plan / Recommendation CHL IP CLINICAL IMPRESSIONS 11/25/2019 Clinical Impression Patient presents with a moderate oropharyngeal dysphagia without aspiration but with incident of very trace penetration during swallow with thin liquids. Oral phase is characterized by weak oral control and movement of boluses, mastication was decreased and resulted in prolonged oral phase with regular solids. He exhibited premature spillage of thin and nectar liquids, delay in swallow initiation to level of vallecular sinus with puree solids and pyriform sinus with thin liquids. Inital swallows of puree solids, regular solids resulted in moderate vallecular residuals and mild pharyngeal residuals. Patient independently initiated swallow which helped clear residuals and sips of thin liquids helped clear residuals in pharynx to trace to minimal in amount. Overall, patient's airway is well protected during swallow with all tested consistencies and he is safe to initiate oral diet at at this time. SLP Visit Diagnosis Dysphagia,  oropharyngeal phase (R13.12) Attention and concentration deficit following -- Frontal lobe and executive function deficit following -- Impact on safety and function Mild aspiration risk   CHL IP TREATMENT RECOMMENDATION 11/25/2019 Treatment Recommendations Therapy as outlined in treatment plan below   Prognosis 11/25/2019 Prognosis for Safe Diet Advancement Good Barriers to Reach Goals -- Barriers/Prognosis Comment -- CHL IP DIET RECOMMENDATION 11/25/2019 SLP Diet Recommendations Thin liquid;Dysphagia 1 (Puree) solids Liquid Administration via Cup;Straw Medication Administration Whole meds with puree Compensations Minimize environmental distractions;Slow rate;Small sips/bites;Follow solids with liquid Postural Changes Remain semi-upright after after feeds/meals (Comment);Seated upright at 90 degrees   CHL IP OTHER RECOMMENDATIONS 11/25/2019 Recommended Consults -- Oral Care Recommendations Oral care BID Other Recommendations --   CHL IP FOLLOW UP RECOMMENDATIONS 11/25/2019 Follow up Recommendations 24 hour supervision/assistance;Skilled Nursing facility;Home health SLP   CHL IP FREQUENCY AND DURATION 11/25/2019 Speech Therapy Frequency (ACUTE ONLY) min 2x/week Treatment Duration --      CHL IP ORAL PHASE 11/25/2019 Oral Phase Impaired Oral -  Pudding Teaspoon -- Oral - Pudding Cup -- Oral - Honey Teaspoon -- Oral - Honey Cup -- Oral - Nectar Teaspoon -- Oral - Nectar Cup Weak lingual manipulation;Premature spillage Oral - Nectar Straw -- Oral - Thin Teaspoon -- Oral - Thin Cup Premature spillage Oral - Thin Straw Premature spillage Oral - Puree Premature spillage Oral - Mech Soft -- Oral - Regular Delayed oral transit;Weak lingual manipulation Oral - Multi-Consistency -- Oral - Pill Impaired mastication;Weak lingual manipulation;Delayed oral transit Oral Phase - Comment --  CHL IP PHARYNGEAL PHASE 11/25/2019 Pharyngeal Phase Impaired Pharyngeal- Pudding Teaspoon -- Pharyngeal -- Pharyngeal- Pudding Cup -- Pharyngeal  -- Pharyngeal- Honey Teaspoon -- Pharyngeal -- Pharyngeal- Honey Cup -- Pharyngeal -- Pharyngeal- Nectar Teaspoon -- Pharyngeal -- Pharyngeal- Nectar Cup Delayed swallow initiation-vallecula;Pharyngeal residue - valleculae;Pharyngeal residue - pyriform Pharyngeal -- Pharyngeal- Nectar Straw -- Pharyngeal -- Pharyngeal- Thin Teaspoon -- Pharyngeal -- Pharyngeal- Thin Cup Delayed swallow initiation-vallecula;Delayed swallow initiation-pyriform sinuses;Penetration/Aspiration during swallow;Pharyngeal residue - valleculae;Pharyngeal residue - pyriform Pharyngeal Material enters airway, remains ABOVE vocal cords then ejected out Pharyngeal- Thin Straw Delayed swallow initiation-pyriform sinuses;Delayed swallow initiation-vallecula Pharyngeal -- Pharyngeal- Puree Delayed swallow initiation-vallecula;Pharyngeal residue - valleculae Pharyngeal -- Pharyngeal- Mechanical Soft -- Pharyngeal -- Pharyngeal- Regular Pharyngeal residue - valleculae Pharyngeal -- Pharyngeal- Multi-consistency -- Pharyngeal -- Pharyngeal- Pill WFL Pharyngeal -- Pharyngeal Comment --  CHL IP CERVICAL ESOPHAGEAL PHASE 11/25/2019 Cervical Esophageal Phase WFL Pudding Teaspoon -- Pudding Cup -- Honey Teaspoon -- Honey Cup -- Nectar Teaspoon -- Nectar Cup -- Nectar Straw -- Thin Teaspoon -- Thin Cup -- Thin Straw -- Puree -- Mechanical Soft -- Regular -- Multi-consistency -- Pill -- Cervical Esophageal Comment -- Sonia Baller, MA, CCC-SLP Speech Therapy [facility]              Medications:   .  stroke: mapping our early stages of recovery book   Does not apply Once  . aspirin EC  81 mg Oral Daily  . atorvastatin  80 mg Oral Daily  . budesonide  0.5 mg Nebulization BID  . Chlorhexidine Gluconate Cloth  6 each Topical Daily  . enoxaparin (LOVENOX) injection  40 mg Subcutaneous Q24H  . guaiFENesin  600 mg Oral BID  . nicotine  14 mg Transdermal Daily  . pantoprazole (PROTONIX) IV  40 mg Intravenous Q12H  . ticagrelor  90 mg Oral BID    Continuous Infusions:    LOS: 4 days   Geradine Girt  [facility]   How to contact the [facility] Attending or Consulting provider [place] or covering provider during after hours [place], for this patient?  1. Check the care team in [facility] and look for a) attending/consulting TRH provider listed and b) the [facility] team listed 2. Log into [email or web address] and use [place]'s universal password to access. If you do not have the password, please contact the hospital operator. 3. Locate the [facility] provider you are looking for under [facility] and page to a number that you can be directly reached. 4. If you still have difficulty reaching the provider, please page the [facility] (Director on Call) for the Hospitalists listed on amion for assistance.  11/25/2019, 2:15 PM

## 2019-11-26 DIAGNOSIS — I10 Essential (primary) hypertension: Secondary | ICD-10-CM | POA: Diagnosis not present

## 2019-11-26 DIAGNOSIS — I639 Cerebral infarction, unspecified: Secondary | ICD-10-CM | POA: Diagnosis not present

## 2019-11-26 LAB — BASIC METABOLIC PANEL
Anion gap: 11 (ref 5–15)
BUN: 9 mg/dL (ref 8–23)
CO2: 24 mmol/L (ref 22–32)
Calcium: 8.8 mg/dL — ABNORMAL LOW (ref 8.9–10.3)
Chloride: 100 mmol/L (ref 98–111)
Creatinine, Ser: 0.73 mg/dL (ref 0.61–1.24)
GFR, Estimated: >60 mL/min (ref >60)
Glucose, Bld: 112 mg/dL — ABNORMAL HIGH (ref 70–99)
Potassium: 3.6 mmol/L (ref 3.5–5.1)
Sodium: 135 mmol/L (ref 135–145)

## 2019-11-26 LAB — CBC
HCT: 34.7 % — ABNORMAL LOW (ref 39.0–52.0)
Hemoglobin: 11.3 g/dL — ABNORMAL LOW (ref 13.0–17.0)
MCH: 27.4 pg (ref 26.0–34.0)
MCHC: 32.6 g/dL (ref 30.0–36.0)
MCV: 84 fL (ref 80.0–100.0)
Platelets: 508 10*3/uL — ABNORMAL HIGH (ref 150–400)
RBC: 4.13 MIL/uL — ABNORMAL LOW (ref 4.22–5.81)
RDW: 16.9 % — ABNORMAL HIGH (ref 11.5–15.5)
WBC: 14 10*3/uL — ABNORMAL HIGH (ref 4.0–10.5)
nRBC: 0 % (ref 0.0–0.2)

## 2019-11-26 LAB — TSH: TSH: 4.724 u[IU]/mL — ABNORMAL HIGH (ref 0.350–4.500)

## 2019-11-26 LAB — PLATELET INHIBITION P2Y12: Platelet Function  P2Y12: 6 [PRU] — ABNORMAL LOW (ref 182–335)

## 2019-11-26 MED ORDER — ACETYLCYSTEINE 20 % IN SOLN: 4.0000 mL | Freq: Two times a day (BID) | RESPIRATORY_TRACT | Status: DC

## 2019-11-26 MED ORDER — ENSURE ENLIVE PO LIQD
237.0000 mL | Freq: Three times a day (TID) | ORAL | Status: DC
  Administered 2019-11-26 – 2019-12-11 (×34): 237 mL via ORAL

## 2019-11-26 MED ORDER — ADULT MULTIVITAMIN W/MINERALS CH
1.0000 | ORAL_TABLET | Freq: Every day | ORAL | Status: DC
  Administered 2019-11-26 – 2019-12-11 (×14): 1 via ORAL
  Filled 2019-11-26 (×17): qty 1

## 2019-11-26 MED ORDER — BUDESONIDE 0.5 MG/2ML IN SUSP
0.5000 mg | Freq: Two times a day (BID) | RESPIRATORY_TRACT | Status: DC
  Administered 2019-11-26 – 2019-11-28 (×5): 0.5 mg via RESPIRATORY_TRACT
  Filled 2019-11-26 (×4): qty 2

## 2019-11-26 MED ORDER — ACETYLCYSTEINE 20 % IN SOLN
4.0000 mL | Freq: Two times a day (BID) | RESPIRATORY_TRACT | Status: DC
  Administered 2019-11-26 – 2019-12-04 (×16): 4 mL via RESPIRATORY_TRACT
  Filled 2019-11-26 (×17): qty 4

## 2019-11-26 MED ORDER — PANTOPRAZOLE SODIUM 40 MG PO TBEC
40.0000 mg | DELAYED_RELEASE_TABLET | Freq: Two times a day (BID) | ORAL | Status: DC
  Administered 2019-11-26 – 2019-12-11 (×30): 40 mg via ORAL
  Filled 2019-11-26 (×33): qty 1

## 2019-11-26 NOTE — TOC Progression Note (Addendum)
Transition of Care [facility]) - Progression Note    Patient Details  Name: Evan [name] MRN: [id number] Date of Birth: 1950-03-26  Transition of Care [facility]) CM/SW [place], [place] Phone Number: 11/26/2019, 12:14 PM  Clinical Narrative:   CSW met with patient and son at bedside to discuss recommendation for SNF. Son said he still hadn't talked to his sister yet, but discussed with the patient and patient is agreeable to pursuing SNF. Patient and son have no preference for SNF, will need bed offers. CSW explained referral process. CSW faxed out referral, will follow up with patient and son with bed offers for choice.   UPDATE: CSW met with son at bedside (patient was asleep) and provided CMS choice and bed offers. Son to review and will follow up with CSW on choice.    Expected Discharge Plan: Skilled Nursing Facility Barriers to Discharge: Ship broker, Continued Medical Work up  Expected Discharge Plan and Services Expected Discharge Plan: [place]                                               Social Determinants of Health (SDOH) Interventions    Readmission Risk Interventions No flowsheet data found.

## 2019-11-26 NOTE — Progress Notes (Signed)
Nasal tracheal suctioned patient for small amount of thin secretions. CPT added to therapy.

## 2019-11-26 NOTE — [place] FL2 (Signed)
[place] MEDICAID FL2 LEVEL OF CARE SCREENING TOOL     IDENTIFICATION  Patient Name: Evan [name] Birthdate: Nov 19, 1950 Sex: male Admission Date (Current Location): 11/21/2019  [facility] and [place] Number:  Herbalist and Address:  [facility] [place] [place] [facility], [place] [address] [place], [place] [address]      Provider Number: [id number]  Attending Physician Name and Address:  Geradine Girt, DO  Relative Name and Phone Number:       Current Level of Care: Hospital Recommended Level of Care: [place] Prior Approval Number:    Date Approved/Denied:   PASRR Number: [id number]  Discharge Plan: SNF    Current Diagnoses: Patient Active Problem List   Diagnosis Date Noted  . Acute cerebrovascular accident (CVA) ([place]) 11/21/2019  . Hyperkalemia 11/21/2019  . Protein-calorie malnutrition, severe 11/17/2019  . Acute exacerbation of chronic obstructive pulmonary disease (COPD) ([place]) 11/15/2019  . Constipation 10/29/2019  . Asymptomatic bacteriuria 10/29/2019  . Pneumonia 10/28/2019  . Primary cancer of left lower lobe of lung ([place]) 04/06/2019  . Involuntary movements 08/24/2018  . Primary cancer of right middle lobe of lung ([place]) 07/16/2018  . Nodule of middle lobe of right lung 06/20/2018  . Bilateral carotid artery stenosis 02/18/2017  . Left renal mass 07/19/2016  . Phlebitis 02/04/2015  . Full code status 11/04/2014  . Encounter for antineoplastic chemotherapy 11/04/2014  . 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 TOBACCO ABUSE 04/02/2008  . 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 Hyperlipidemia 02/09/2008  . DEPRESSION 02/09/2008  . Essential hypertension 02/09/2008  . COPD (chronic obstructive pulmonary disease) ([place]) 02/09/2008  . HEADACHE 02/09/2008    Orientation RESPIRATION BLADDER Height & Weight     Self,  Time, Situation, Place  O2 ([place] 2L) Indwelling catheter Weight: 106 lb (48.1 kg) Height:  5\' 5"  (165.1 cm)  BEHAVIORAL SYMPTOMS/MOOD NEUROLOGICAL BOWEL NUTRITION STATUS      Continent Diet  AMBULATORY STATUS COMMUNICATION OF NEEDS Skin   Limited Assist Verbally Normal                       Personal Care Assistance Level of Assistance  Bathing, Dressing, Feeding Bathing Assistance: Limited assistance Feeding assistance: Independent Dressing Assistance: Limited assistance     Functional Limitations Info  Speech     Speech Info: Impaired (dysarthria)    SPECIAL CARE FACTORS FREQUENCY  PT (By licensed PT), OT (By licensed OT), Speech therapy     PT Frequency: 5x/wk OT Frequency: 5x/wk     Speech Therapy Frequency: 5x/wk      Contractures Contractures Info: Not present    Additional Factors Info  Code Status, Allergies Code Status Info: Full Allergies Info: Chantix           Current Medications (11/26/2019):  This is the current hospital active medication list Current Facility-Administered Medications  Medication Dose Route Frequency Provider Last Rate Last Admin  .  stroke: mapping our early stages of recovery book   Does not apply Once Gala Romney L, MD      . acetaminophen (TYLENOL) tablet 650 mg  650 mg Oral Q4H PRN Elwyn Reach, MD   650 mg at 11/25/19 1247   Or  . acetaminophen (TYLENOL) 160 MG/5ML solution 650 mg  650 mg Per Tube Q4H PRN Elwyn Reach, MD  Or  . acetaminophen (TYLENOL) suppository 650 mg  650 mg Rectal Q4H PRN Gala Romney L, MD      . Ampicillin-Sulbactam (UNASYN) 3 g in sodium chloride 0.9 % 100 mL IVPB  3 g Intravenous Q8H Dimple Nanas, RPH   Paused at 11/26/19 1114  . aspirin EC tablet 81 mg  81 mg Oral Daily Rosalin Hawking, MD   81 mg at 11/26/19 0848  . atorvastatin (LIPITOR) tablet 80 mg  80 mg Oral Daily Elwyn Reach, MD   80 mg at 11/26/19 0849  . budesonide (PULMICORT) nebulizer solution 0.5 mg  0.5  mg Nebulization BID Eulogio Bear U, DO   0.5 mg at 11/26/19 0719  . Chlorhexidine Gluconate Cloth 2 % PADS 6 each  6 each Topical Daily Eulogio Bear U, DO   6 each at 11/26/19 1110  . enoxaparin (LOVENOX) injection 40 mg  40 mg Subcutaneous Q24H Gala Romney L, MD   40 mg at 11/26/19 0858  . feeding supplement (ENSURE ENLIVE / ENSURE PLUS) liquid 237 mL  237 mL Oral TID BM Vann, Jessica U, DO   237 mL at 11/26/19 1109  . guaiFENesin (MUCINEX) 12 hr tablet 600 mg  600 mg Oral BID Shelly Coss, MD   600 mg at 11/26/19 0848  . hydrOXYzine (VISTARIL) injection 25 mg  25 mg Intramuscular Q6H PRN Eulogio Bear U, DO   25 mg at 11/25/19 1823  . ipratropium-albuterol (DUONEB) 0.5-2.5 (3) MG/3ML nebulizer solution 3 mL  3 mL Nebulization Q4H PRN Opyd, Ilene Qua, MD   3 mL at 11/26/19 0201  . multivitamin with minerals tablet 1 tablet  1 tablet Oral Daily Eulogio Bear U, DO   1 tablet at 11/26/19 0904  . nicotine (NICODERM CQ - dosed in mg/24 hours) patch 14 mg  14 mg Transdermal Daily Shelly Coss, MD   14 mg at 11/26/19 0905  . pantoprazole (PROTONIX) injection 40 mg  40 mg Intravenous Q12H Vann, Jessica U, DO   40 mg at 11/26/19 0900  . senna-docusate (Senokot-S) tablet 1 tablet  1 tablet Oral QHS PRN Elwyn Reach, MD      . ticagrelor (BRILINTA) tablet 90 mg  90 mg Oral BID Rosalin Hawking, MD   90 mg at 11/26/19 1859     Discharge Medications: Please see discharge summary for a list of discharge medications.  Relevant Imaging Results:  Relevant Lab Results:   Additional Information SS#: [id number]  Geralynn Ochs, LCSW

## 2019-11-26 NOTE — Progress Notes (Signed)
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 Estanislado Pandy revealing bilateral common carotid artery stenosis- thought to be cause of recent CVA (left-sided weakness- symptomatic from right CCA stenosis). Patient awake and alert sitting in bed watching TV with no complaints at this time. States his left-sided weakness is unchanged since admission. Denies fever, chills, chest pain, dyspnea, abdominal pain, or headache. Discussed results of diagnostic cerebral arteriogram 11/23/2019, including plans for revascularization of right CCA.   Allergies: Chantix [varenicline]  Medications: Prior to Admission medications   Medication Sig Start Date End Date Taking? Authorizing Provider  acetaminophen (TYLENOL) 500 MG tablet Take 500 mg by mouth every 6 (six) hours as needed for mild pain, fever or headache.    Yes [provider]  albuterol (VENTOLIN HFA) 108 (90 Base) MCG/ACT inhaler TAKE 2 PUFFS BY MOUTH EVERY 6 HOURS AS NEEDED FOR WHEEZE OR SHORTNESS OF BREATH Patient taking differently: Inhale 2 puffs into the lungs every 6 (six) hours as needed for wheezing or shortness of breath.  08/30/19  Yes Rigoberto Noel, MD  benzonatate (TESSALON) 100 MG capsule Take 1 capsule (100 mg total) by mouth every 8 (eight) hours. 11/09/19  Yes Dorie Rank, MD  Glycopyrrolate-Formoterol (BEVESPI AEROSPHERE) 9-4.8 MCG/ACT AERO Inhale 2 puffs into the lungs 2 (two) times daily. 06/26/19  Yes Rigoberto Noel, MD  guaiFENesin (MUCINEX) 600 MG 12 hr tablet Take 600 mg by mouth 2 (two) times daily as needed for cough.   Yes [provider]  guaiFENesin (ROBITUSSIN) 100 MG/5ML liquid Take 200 mg by mouth 3 (three) times daily as needed for cough.   Yes  [provider]  nicotine (NICODERM CQ - DOSED IN MG/24 HOURS) 14 mg/24hr patch Place 1 patch (14 mg total) onto the skin daily. For 6 weeks.  Then decrease to 7 mg daily. 07/25/19  Yes Yopp, Amber C, RPH-CPP  tamsulosin (FLOMAX) 0.4 MG CAPS capsule Take 0.4 mg by mouth at bedtime. 10/10/19  Yes [provider]  budesonide (PULMICORT) 0.5 MG/2ML nebulizer solution Take 2 mLs (0.5 mg total) by nebulization 2 (two) times daily. 11/21/19   Martyn Ehrich, NP  [employer] ASPIRIN EC 325 MG EC tablet TAKE 1 TABLET BY MOUTH EVERY DAY Patient taking differently: Take 325 mg by mouth daily.  08/16/19   Vaslow, Acey Lav, MD  ipratropium-albuterol (DUONEB) 0.5-2.5 (3) MG/3ML SOLN Take 3 mLs by nebulization every 6 (six) hours as needed. Patient taking differently: Take 3 mLs by nebulization every 6 (six) hours as needed (sob).  11/21/19   Martyn Ehrich, NP  nicotine polacrilex (NICORETTE) 4 MG gum Take 1 each (4 mg total) by mouth as needed for smoking cessation. Patient not taking: Reported on 11/15/2019 07/25/19   Yopp, [employer] C, RPH-CPP     Vital Signs: BP 95/71 (BP Location: Left Arm)   Pulse 71   Temp 98.7 F (37.1 C) (Axillary)   Resp 20   Ht 5\' 5"  (1.651 m)   Wt 106 lb (48.1 kg)   SpO2 100%   BMI 17.64 kg/m   Physical Exam Vitals and nursing note reviewed.  Constitutional:      General: He is not in acute distress. Cardiovascular:     Rate and Rhythm: Normal rate and regular rhythm.  Heart sounds: Normal heart sounds. No murmur heard.   Pulmonary:     Effort: Pulmonary effort is normal. No respiratory distress.     Breath sounds: Wheezing present.  Musculoskeletal:     Comments: (+) right BKA.  Skin:    General: Skin is warm and dry.  Neurological:     Mental Status: He is alert and oriented to person, place, and time.     Imaging: IR US Guide Vasc Access Right  Result Date: 11/26/2019 CLINICAL DATA:  Left-sided weakness arm greater than leg. CT  angiogram of the head and neck demonstrating high-grade stenosis of the common carotid arteries bilaterally. EXAM: BILATERAL COMMON CAROTID AND INNOMINATE ANGIOGRAPHY COMPARISON:  CT angiogram of the head and neck of November 30, 2019. MEDICATIONS: Heparin 2000 units IA. No antibiotic was administered within 1 hour of the procedure. ANESTHESIA/SEDATION: Versed 0.5 mg IV; Fentanyl 12.5 mcg IV Moderate Sedation Time:  35 minutes The patient was continuously monitored during the procedure by the interventional radiology nurse under my direct supervision. CONTRAST:  Omnipaque 300 approximately 65 mL FLUOROSCOPY TIME:  Fluoroscopy Time: 10 minutes 36 seconds (530 mGy). COMPLICATIONS: None immediate. TECHNIQUE: Informed written consent was obtained from the patient after a thorough discussion of the procedural risks, benefits and alternatives. All questions were addressed. Maximal Sterile Barrier Technique was utilized including caps, mask, sterile gowns, sterile gloves, sterile drape, hand hygiene and skin antiseptic. A timeout was performed prior to the initiation of the procedure. The right forearm to the wrist was prepped and draped in the usual sterile manner. The right radial artery was then identified with ultrasound, and its morphology documented. A dorsal palmar anastomosis was verified to be present. Using ultrasound guidance, radial access was obtained using a micropuncture set over a 0.018 inch micro guidewire. A 4/5 French radial sheath was then inserted. The micro guidewire, and the obturator were removed. Good aspiration obtained from the side port of the radial sheath. A cocktail of 1000 units of heparin, and 200 mcg of nitroglycerin was then infused through the radial sheath without event. A radial arteriogram was performed. Over a 0.035 inch Roadrunner guidewire, a Simmons 2 diagnostic catheter was then advanced to the aortic arch region, and selectively positioned in the right common carotid artery, the  left common carotid artery, the left vertebral artery and the right subclavian artery. Following the procedure, hemostasis at the right radial puncture site was achieved with a wrist band. Distal right radial pulse was verified to be present. FINDINGS: The right common carotid arteriogram demonstrates segmental high-grade stenosis of 90% of the right common carotid artery at the junction of the middle and proximal 1/3 is seen. Distal to this opacification of the right common carotid artery and the right internal carotid artery is seen. There is a mild stenosis at the origin of the right internal carotid artery. More distally the petrous, the cavernous and the supraclinoid segments are widely patent. The right middle cerebral artery and the right anterior cerebral artery opacify into the capillary and venous phases. Cross-filling via the anterior communicating artery of the left anterior cerebral A2 segment and distally is seen. No opacification of the right external carotid artery at its origin is seen. The right subclavian arteriogram demonstrates hypoplastic right vertebral artery with flow seen to the cranial skull base. Retrograde opacification of the right external carotid artery territory is seen via the ipsilateral right occipital artery. The left common carotid arteriogram demonstrates severe segmental stenosis of 90% plus at the junction  of the middle and proximal third of the left common carotid artery. More distally post stenotic dilatation is seen. Patency is seen of the left internal carotid artery at the bulb to the cranial skull base. The petrous, the cavernous and the supraclinoid segments are widely patent. The left middle cerebral artery and the left anterior cerebral artery opacify into the capillary and venous phases. Non opacification of the left external carotid artery is seen. The dominant left vertebral artery origin is widely patent. The vessel opacifies to the cranial skull base. Patency is  seen of the left vertebrobasilar junction, and the left posterior-inferior cerebellar artery. There is retrograde opacification of the left external carotid artery distribution via multiple musculoskeletal collaterals arising from the distal left vertebral artery. More distally the left vertebrobasilar junction, the basilar artery, the posterior cerebral arteries, the superior cerebellar arteries and the anterior-inferior cerebellar arteries is seen into the capillary and venous phases. Retrograde opacification of the right vertebrobasilar junction proximal to the right posterior-inferior cerebellar artery is seen. Partial retrograde opacification via the left posterior communicating artery of the left middle cerebral artery distribution is seen. 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 PLAN: Findings reviewed with the patient and referring neurologist. Given the symptomatic nature of the right common carotid artery stenosis, endovascular revascularization scheduled for early next week. Electronically Signed   By: Luanne Bras M.D.   On: 11/23/2019 16:25   DG CHEST PORT 1 VIEW  Result Date: 11/25/2019 CLINICAL DATA:  Pneumonia, history of lung cancer. EXAM: PORTABLE CHEST 1 VIEW COMPARISON:  November 22, 2019. FINDINGS: Stable cardiomediastinal silhouette. No pneumothorax or pleural effusion is noted. Left lung is clear. Slightly increased right basilar opacity is noted concerning for possible atelectasis or infiltrate. Stable bilateral hilar findings consistent with retraction or fibrosis. Bony thorax is unremarkable. IMPRESSION: Slightly increased right basilar opacity is noted concerning for possible atelectasis or infiltrate.  Electronically Signed   By: Marijo Conception M.D.   On: 11/25/2019 12:24   DG CHEST PORT 1 VIEW  Result Date: 11/22/2019 CLINICAL DATA:  Dyspnea. EXAM: PORTABLE CHEST 1 VIEW COMPARISON:  November 21, 2019. FINDINGS: The heart size and mediastinal contours are within normal limits. No pneumothorax or pleural effusion is noted. Stable findings consistent with bilateral hilar retraction. Right apical pleural thickening is noted which is unchanged. No acute pulmonary disease is noted. The visualized skeletal structures are unremarkable. IMPRESSION: No active disease. Electronically Signed   By: Marijo Conception M.D.   On: 11/22/2019 19:57   DG Swallowing Func-Speech Pathology  Result Date: 11/25/2019 Objective Swallowing Evaluation: Type of Study: MBS-Modified Barium Swallow Study  Patient Details Name: Evan [name] MRN: [id number] Date of Birth: [birth year] Today's Date: 11/25/2019 Time: SLP Start Time (ACUTE ONLY): 1100 -SLP Stop Time (ACUTE ONLY): 1130 SLP Time Calculation (min) (ACUTE ONLY): 30 min Past Medical History: Past Medical History: Diagnosis Date . Anxiety  . Complication of anesthesia   " sometimes I wake up during surgery " . Depression  . Left renal mass 07/19/2016 . Lung cancer ([place]) dx'd 2010   NSCL CA right . Lung cancer ([place]) dx'd 2017  left Past Surgical History: Past Surgical History: Procedure Laterality Date . AMPUTATION Right 05/21/2012  Procedure: AMPUTATION BELOW KNEE;  Surgeon: Beverely Low  Fernanda Drum, MD;  Location: [facility];  Service: Orthopedics;  Laterality: Right; . AMPUTATION Right 06/21/2012  Procedure: Revision AMPUTATION BELOW KNEE Right;  Surgeon: Newt Minion, MD;  Location: [facility];  Service: Orthopedics;  Laterality: Right;  Revision right Below Knee Amputation . COLONOSCOPY W/ BIOPSIES AND POLYPECTOMY   . FOOT AMPUTATION Right   traumatic right lower extremity . UPPER JAW    SURGERY FOR INFECTION HPI: JENTRY WARNELL is a 69 y.o. 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 Now with worsening dysphagia over past 24-48 hours.  Subjective: alert but not very interactive Assessment / Plan / Recommendation CHL IP CLINICAL IMPRESSIONS 11/25/2019 Clinical Impression Patient presents with a moderate oropharyngeal dysphagia without aspiration but with incident of very trace penetration during swallow with thin liquids. Oral phase is characterized by weak oral control and movement of boluses, mastication was decreased and resulted in prolonged oral phase with regular solids. He exhibited premature spillage of thin and nectar liquids, delay in swallow initiation to level of vallecular sinus with puree solids and pyriform sinus with thin liquids. Inital swallows of puree solids, regular solids resulted in moderate vallecular residuals and mild pharyngeal residuals. Patient independently initiated swallow which helped clear residuals and sips of thin liquids helped clear residuals in pharynx to trace to minimal in amount. Overall, patient's airway is well protected during swallow with all tested consistencies and he is safe to initiate oral diet at at this time. SLP Visit Diagnosis Dysphagia, oropharyngeal phase (R13.12) Attention and concentration deficit following -- Frontal lobe and executive function deficit following -- Impact on safety and function Mild aspiration risk   CHL IP TREATMENT RECOMMENDATION 11/25/2019 Treatment Recommendations Therapy as outlined in treatment plan below   Prognosis 11/25/2019 Prognosis for Safe Diet Advancement Good Barriers to Reach Goals -- Barriers/Prognosis Comment -- CHL IP DIET RECOMMENDATION  11/25/2019 SLP Diet Recommendations Thin liquid;Dysphagia 1 (Puree) solids Liquid Administration via Cup;Straw Medication Administration Whole meds with puree Compensations Minimize environmental distractions;Slow rate;Small sips/bites;Follow solids with liquid Postural Changes Remain semi-upright after after feeds/meals (Comment);Seated upright at 90 degrees   CHL IP OTHER RECOMMENDATIONS 11/25/2019 Recommended Consults -- Oral Care Recommendations Oral care BID Other Recommendations --   CHL IP FOLLOW UP RECOMMENDATIONS 11/25/2019 Follow up Recommendations 24 hour supervision/assistance;Skilled Nursing facility;Home health SLP   CHL IP FREQUENCY AND DURATION 11/25/2019 Speech Therapy Frequency (ACUTE ONLY) min 2x/week Treatment Duration --      CHL IP ORAL PHASE 11/25/2019 Oral Phase Impaired Oral - Pudding Teaspoon -- Oral - Pudding Cup -- Oral - Honey Teaspoon -- Oral - Honey Cup -- Oral - Nectar Teaspoon -- Oral - Nectar Cup Weak lingual manipulation;Premature spillage Oral - Nectar Straw -- Oral - Thin Teaspoon -- Oral - Thin Cup Premature spillage Oral - Thin Straw Premature spillage Oral - Puree Premature spillage Oral - Mech Soft -- Oral - Regular Delayed oral transit;Weak lingual manipulation Oral - Multi-Consistency -- Oral - Pill Impaired mastication;Weak lingual manipulation;Delayed oral transit Oral Phase - Comment --  CHL IP PHARYNGEAL PHASE 11/25/2019 Pharyngeal Phase Impaired Pharyngeal- Pudding Teaspoon -- Pharyngeal -- Pharyngeal- Pudding Cup -- Pharyngeal -- Pharyngeal- Honey Teaspoon -- Pharyngeal -- Pharyngeal- Honey Cup --  Pharyngeal -- Pharyngeal- Nectar Teaspoon -- Pharyngeal -- Pharyngeal- Nectar Cup Delayed swallow initiation-vallecula;Pharyngeal residue - valleculae;Pharyngeal residue - pyriform Pharyngeal -- Pharyngeal- Nectar Straw -- Pharyngeal -- Pharyngeal- Thin Teaspoon -- Pharyngeal -- Pharyngeal- Thin Cup Delayed swallow initiation-vallecula;Delayed swallow initiation-pyriform  sinuses;Penetration/Aspiration during swallow;Pharyngeal residue - valleculae;Pharyngeal residue - pyriform Pharyngeal Material enters airway, remains ABOVE vocal cords then ejected out Pharyngeal- Thin Straw Delayed swallow initiation-pyriform sinuses;Delayed swallow initiation-vallecula Pharyngeal -- Pharyngeal- Puree Delayed swallow initiation-vallecula;Pharyngeal residue - valleculae Pharyngeal -- Pharyngeal- Mechanical Soft -- Pharyngeal -- Pharyngeal- Regular Pharyngeal residue - valleculae Pharyngeal -- Pharyngeal- Multi-consistency -- Pharyngeal -- Pharyngeal- Pill WFL Pharyngeal -- Pharyngeal Comment --  CHL IP CERVICAL ESOPHAGEAL PHASE 11/25/2019 Cervical Esophageal Phase WFL Pudding Teaspoon -- Pudding Cup -- Honey Teaspoon -- Honey Cup -- Nectar Teaspoon -- Nectar Cup -- Nectar Straw -- Thin Teaspoon -- Thin Cup -- Thin Straw -- Puree -- Mechanical Soft -- Regular -- Multi-consistency -- Pill -- Cervical Esophageal Comment -- Sonia Baller, MA, CCC-SLP Speech Therapy [facility]             ECHOCARDIOGRAM COMPLETE  Result Date: 11/22/2019    ECHOCARDIOGRAM REPORT   Patient Name:   Evan [name] Date of Exam: 11/22/2019 Medical Rec #:  [id number]       Height:       65.0 in Accession #:    [id number]      Weight:       106.0 lb Date of Birth:  [birth year]       BSA:          1.510 m Patient Age:    4 years        BP:           117/76 mmHg Patient Gender: M               HR:           106 bpm. Exam Location:  Inpatient Procedure: 2D Echo, Cardiac Doppler and Color Doppler Indications:    Stroke 434.91 / I163.9  History:        Patient has no prior history of Echocardiogram examinations.                 COPD; Risk Factors:Current Smoker and Dyslipidemia.  Sonographer:    Bernadene Person RDCS Referring Phys: [place]  1. Left ventricular ejection fraction, by estimation, is 55 to 60%. The left ventricle has normal function. The left ventricle has no regional wall motion  abnormalities. Left ventricular diastolic parameters are consistent with Grade I diastolic dysfunction (impaired relaxation). Elevated left atrial pressure.  2. Right ventricular systolic function is normal. The right ventricular size is normal.  3. The mitral valve is normal in structure. Trivial mitral valve regurgitation. No evidence of mitral stenosis.  4. The aortic valve is normal in structure. Aortic valve regurgitation is not visualized. No aortic stenosis is present.  5. The inferior vena cava is normal in size with greater than 50% respiratory variability, suggesting right atrial pressure of 3 mmHg. Comparison(s): No prior Echocardiogram. FINDINGS  Left Ventricle: Left ventricular ejection fraction, by estimation, is 55 to 60%. The left ventricle has normal function. The left ventricle has no regional wall motion abnormalities. The left ventricular internal cavity size was normal in size. There is  no left ventricular hypertrophy. Left ventricular diastolic parameters are consistent with Grade I diastolic dysfunction (impaired relaxation). Elevated left atrial pressure. Right Ventricle: The right ventricular size  is normal. No increase in right ventricular wall thickness. Right ventricular systolic function is normal. Left Atrium: Left atrial size was normal in size. Right Atrium: Right atrial size was normal in size. Pericardium: There is no evidence of pericardial effusion. Mitral Valve: The mitral valve is normal in structure. Trivial mitral valve regurgitation. No evidence of mitral valve stenosis. Tricuspid Valve: The tricuspid valve is normal in structure. Tricuspid valve regurgitation is trivial. No evidence of tricuspid stenosis. Aortic Valve: The aortic valve is normal in structure. Aortic valve regurgitation is not visualized. No aortic stenosis is present. Pulmonic Valve: The pulmonic valve was normal in structure. Pulmonic valve regurgitation is not visualized. No evidence of pulmonic stenosis.  Aorta: The aortic root is normal in size and structure. Venous: The inferior vena cava is normal in size with greater than 50% respiratory variability, suggesting right atrial pressure of 3 mmHg. IAS/Shunts: No atrial level shunt detected by color flow Doppler.  LEFT VENTRICLE PLAX 2D LVIDd:         3.70 cm  Diastology LVIDs:         2.10 cm  LV e' medial:    3.92 cm/s LV PW:         0.80 cm  LV E/e' medial:  14.5 LV IVS:        0.80 cm  LV e' lateral:   9.67 cm/s LVOT diam:     2.10 cm  LV E/e' lateral: 5.9 LV SV:         68 LV SV Index:   45 LVOT Area:     3.46 cm  RIGHT VENTRICLE RV S prime:     12.00 cm/s TAPSE (M-mode): 1.9 cm LEFT ATRIUM             Index      RIGHT ATRIUM          Index LA diam:        1.90 cm 1.26 cm/m RA Area:     7.97 cm LA Vol (A2C):   12.0 ml 7.95 ml/m RA Volume:   14.60 ml 9.67 ml/m LA Vol (A4C):   12.9 ml 8.54 ml/m LA Biplane Vol: 12.6 ml 8.34 ml/m  AORTIC VALVE LVOT Vmax:   108.00 cm/s LVOT Vmean:  70.900 cm/s LVOT VTI:    0.196 m  AORTA Ao Root diam: 3.50 cm MITRAL VALVE MV Area (PHT): 2.37 cm    SHUNTS MV Decel Time: 320 msec    Systemic VTI:  0.20 m MV E velocity: 56.90 cm/s  Systemic Diam: 2.10 cm MV A velocity: 67.20 cm/s MV E/A ratio:  0.85 Ena Dawley MD Electronically signed by Ena Dawley MD Signature Date/Time: 11/22/2019/6:53:37 PM    Final    IR ANGIO INTRA EXTRACRAN SEL COM CAROTID INNOMINATE BILAT MOD SED  Result Date: 11/26/2019 CLINICAL DATA:  Left-sided weakness arm greater than leg. CT angiogram of the head and neck demonstrating high-grade stenosis of the common carotid arteries bilaterally. EXAM: BILATERAL COMMON CAROTID AND INNOMINATE ANGIOGRAPHY COMPARISON:  CT angiogram of the head and neck of November 30, 2019. MEDICATIONS: Heparin 2000 units IA. No antibiotic was administered within 1 hour of the procedure. ANESTHESIA/SEDATION: Versed 0.5 mg IV; Fentanyl 12.5 mcg IV Moderate Sedation Time:  35 minutes The patient was continuously monitored  during the procedure by the interventional radiology nurse under my direct supervision. CONTRAST:  Omnipaque 300 approximately 65 mL FLUOROSCOPY TIME:  Fluoroscopy Time: 10 minutes 36 seconds (530 mGy). COMPLICATIONS: None immediate. TECHNIQUE: Informed  written consent was obtained from the patient after a thorough discussion of the procedural risks, benefits and alternatives. All questions were addressed. Maximal Sterile Barrier Technique was utilized including caps, mask, sterile gowns, sterile gloves, sterile drape, hand hygiene and skin antiseptic. A timeout was performed prior to the initiation of the procedure. The right forearm to the wrist was prepped and draped in the usual sterile manner. The right radial artery was then identified with ultrasound, and its morphology documented. A dorsal palmar anastomosis was verified to be present. Using ultrasound guidance, radial access was obtained using a micropuncture set over a 0.018 inch micro guidewire. A 4/5 French radial sheath was then inserted. The micro guidewire, and the obturator were removed. Good aspiration obtained from the side port of the radial sheath. A cocktail of 1000 units of heparin, and 200 mcg of nitroglycerin was then infused through the radial sheath without event. A radial arteriogram was performed. Over a 0.035 inch Roadrunner guidewire, a Simmons 2 diagnostic catheter was then advanced to the aortic arch region, and selectively positioned in the right common carotid artery, the left common carotid artery, the left vertebral artery and the right subclavian artery. Following the procedure, hemostasis at the right radial puncture site was achieved with a wrist band. Distal right radial pulse was verified to be present. FINDINGS: The right common carotid arteriogram demonstrates segmental high-grade stenosis of 90% of the right common carotid artery at the junction of the middle and proximal 1/3 is seen. Distal to this opacification of the  right common carotid artery and the right internal carotid artery is seen. There is a mild stenosis at the origin of the right internal carotid artery. More distally the petrous, the cavernous and the supraclinoid segments are widely patent. The right middle cerebral artery and the right anterior cerebral artery opacify into the capillary and venous phases. Cross-filling via the anterior communicating artery of the left anterior cerebral A2 segment and distally is seen. No opacification of the right external carotid artery at its origin is seen. The right subclavian arteriogram demonstrates hypoplastic right vertebral artery with flow seen to the cranial skull base. Retrograde opacification of the right external carotid artery territory is seen via the ipsilateral right occipital artery. The left common carotid arteriogram demonstrates severe segmental stenosis of 90% plus at the junction of the middle and proximal third of the left common carotid artery. More distally post stenotic dilatation is seen. Patency is seen of the left internal carotid artery at the bulb to the cranial skull base. The petrous, the cavernous and the supraclinoid segments are widely patent. The left middle cerebral artery and the left anterior cerebral artery opacify into the capillary and venous phases. Non opacification of the left external carotid artery is seen. The dominant left vertebral artery origin is widely patent. The vessel opacifies to the cranial skull base. Patency is seen of the left vertebrobasilar junction, and the left posterior-inferior cerebellar artery. There is retrograde opacification of the left external carotid artery distribution via multiple musculoskeletal collaterals arising from the distal left vertebral artery. More distally the left vertebrobasilar junction, the basilar artery, the posterior cerebral arteries, the superior cerebellar arteries and the anterior-inferior cerebellar arteries is seen into the  capillary and venous phases. Retrograde opacification of the right vertebrobasilar junction proximal to the right posterior-inferior cerebellar artery is seen. Partial retrograde opacification via the left posterior communicating artery of the left middle cerebral artery distribution is seen. 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 PLAN: Findings reviewed with the patient and referring neurologist. Given the symptomatic nature of the right common carotid artery stenosis, endovascular revascularization scheduled for early next week. Electronically Signed   By: Luanne Bras M.D.   On: 11/23/2019 16:25   IR ANGIO INTRA EXTRACRAN SEL INTERNAL CAROTID BILAT MOD SED  Result Date: 11/26/2019 CLINICAL DATA:  Left-sided weakness arm greater than leg. CT angiogram of the head and neck demonstrating high-grade stenosis of the common carotid arteries bilaterally. EXAM: BILATERAL COMMON CAROTID AND INNOMINATE ANGIOGRAPHY COMPARISON:  CT angiogram of the head and neck of November 30, 2019. MEDICATIONS: Heparin 2000 units IA. No antibiotic was administered within 1 hour of the procedure. ANESTHESIA/SEDATION: Versed 0.5 mg IV; Fentanyl 12.5 mcg IV Moderate Sedation Time:  35 minutes The patient was continuously monitored during the procedure by the interventional radiology nurse under my direct supervision. CONTRAST:  Omnipaque 300 approximately 65 mL FLUOROSCOPY TIME:  Fluoroscopy Time: 10 minutes 36 seconds (530 mGy). COMPLICATIONS: None immediate. TECHNIQUE: Informed written consent was obtained from the patient after a thorough discussion of the procedural risks, benefits and alternatives. All questions were addressed. Maximal Sterile Barrier  Technique was utilized including caps, mask, sterile gowns, sterile gloves, sterile drape, hand hygiene and skin antiseptic. A timeout was performed prior to the initiation of the procedure. The right forearm to the wrist was prepped and draped in the usual sterile manner. The right radial artery was then identified with ultrasound, and its morphology documented. A dorsal palmar anastomosis was verified to be present. Using ultrasound guidance, radial access was obtained using a micropuncture set over a 0.018 inch micro guidewire. A 4/5 French radial sheath was then inserted. The micro guidewire, and the obturator were removed. Good aspiration obtained from the side port of the radial sheath. A cocktail of 1000 units of heparin, and 200 mcg of nitroglycerin was then infused through the radial sheath without event. A radial arteriogram was performed. Over a 0.035 inch Roadrunner guidewire, a Simmons 2 diagnostic catheter was then advanced to the aortic arch region, and selectively positioned in the right common carotid artery, the left common carotid artery, the left vertebral artery and the right subclavian artery. Following the procedure, hemostasis at the right radial puncture site was achieved with a wrist band. Distal right radial pulse was verified to be present. FINDINGS: The right common carotid arteriogram demonstrates segmental high-grade stenosis of 90% of the right common carotid artery at the junction of the middle and proximal 1/3 is seen. Distal to this opacification of the right common carotid artery and the right internal carotid artery is seen. There is a mild stenosis at the origin of the right internal carotid artery. More distally the petrous, the cavernous and the supraclinoid segments are widely patent. The right middle cerebral artery and the right anterior cerebral artery opacify into the capillary and venous phases. Cross-filling via the anterior communicating artery of the left anterior  cerebral A2 segment and distally is seen. No opacification of the right external carotid artery at its origin is seen. The right subclavian arteriogram demonstrates hypoplastic right vertebral artery with flow seen to the cranial skull base. Retrograde opacification of the right external carotid artery territory is seen via the ipsilateral right occipital artery. The left common carotid arteriogram  demonstrates severe segmental stenosis of 90% plus at the junction of the middle and proximal third of the left common carotid artery. More distally post stenotic dilatation is seen. Patency is seen of the left internal carotid artery at the bulb to the cranial skull base. The petrous, the cavernous and the supraclinoid segments are widely patent. The left middle cerebral artery and the left anterior cerebral artery opacify into the capillary and venous phases. Non opacification of the left external carotid artery is seen. The dominant left vertebral artery origin is widely patent. The vessel opacifies to the cranial skull base. Patency is seen of the left vertebrobasilar junction, and the left posterior-inferior cerebellar artery. There is retrograde opacification of the left external carotid artery distribution via multiple musculoskeletal collaterals arising from the distal left vertebral artery. More distally the left vertebrobasilar junction, the basilar artery, the posterior cerebral arteries, the superior cerebellar arteries and the anterior-inferior cerebellar arteries is seen into the capillary and venous phases. Retrograde opacification of the right vertebrobasilar junction proximal to the right posterior-inferior cerebellar artery is seen. Partial retrograde opacification via the left posterior communicating artery of the left middle cerebral artery distribution is seen. 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 PLAN: Findings reviewed with the patient and referring neurologist. Given the symptomatic nature of the right common carotid artery stenosis, endovascular revascularization scheduled for early next week. Electronically Signed   By: Luanne Bras M.D.   On: 11/23/2019 16:25   IR ANGIO VERTEBRAL SEL VERTEBRAL UNI L MOD SED  Result Date: 11/26/2019 CLINICAL DATA:  Left-sided weakness arm greater than leg. CT angiogram of the head and neck demonstrating high-grade stenosis of the common carotid arteries bilaterally. EXAM: BILATERAL COMMON CAROTID AND INNOMINATE ANGIOGRAPHY COMPARISON:  CT angiogram of the head and neck of November 30, 2019. MEDICATIONS: Heparin 2000 units IA. No antibiotic was administered within 1 hour of the procedure. ANESTHESIA/SEDATION: Versed 0.5 mg IV; Fentanyl 12.5 mcg IV Moderate Sedation Time:  35 minutes The patient was continuously monitored during the procedure by the interventional radiology nurse under my direct supervision. CONTRAST:  Omnipaque 300 approximately 65 mL FLUOROSCOPY TIME:  Fluoroscopy Time: 10 minutes 36 seconds (530 mGy). COMPLICATIONS: None immediate. TECHNIQUE: Informed written consent was obtained from the patient after a thorough discussion of the procedural risks, benefits and alternatives. All questions were addressed. Maximal Sterile Barrier Technique was utilized including caps, mask, sterile gowns, sterile gloves, sterile drape, hand hygiene and skin antiseptic. A timeout was performed prior to the initiation of the procedure. The right forearm to the wrist was prepped and draped in the usual sterile manner. The right radial artery was then identified with ultrasound, and its morphology documented. A dorsal palmar anastomosis was verified to be present. Using ultrasound guidance,  radial access was obtained using a micropuncture set over a 0.018 inch micro guidewire. A 4/5 French radial sheath was then inserted. The micro guidewire, and the obturator were removed. Good aspiration obtained from the side port of the radial sheath. A cocktail of 1000 units of heparin, and 200 mcg of nitroglycerin was then infused through the radial sheath without event. A radial arteriogram was performed. Over a 0.035 inch Roadrunner guidewire, a Simmons 2 diagnostic catheter was then advanced to the aortic arch region,  and selectively positioned in the right common carotid artery, the left common carotid artery, the left vertebral artery and the right subclavian artery. Following the procedure, hemostasis at the right radial puncture site was achieved with a wrist band. Distal right radial pulse was verified to be present. FINDINGS: The right common carotid arteriogram demonstrates segmental high-grade stenosis of 90% of the right common carotid artery at the junction of the middle and proximal 1/3 is seen. Distal to this opacification of the right common carotid artery and the right internal carotid artery is seen. There is a mild stenosis at the origin of the right internal carotid artery. More distally the petrous, the cavernous and the supraclinoid segments are widely patent. The right middle cerebral artery and the right anterior cerebral artery opacify into the capillary and venous phases. Cross-filling via the anterior communicating artery of the left anterior cerebral A2 segment and distally is seen. No opacification of the right external carotid artery at its origin is seen. The right subclavian arteriogram demonstrates hypoplastic right vertebral artery with flow seen to the cranial skull base. Retrograde opacification of the right external carotid artery territory is seen via the ipsilateral right occipital artery. The left common carotid arteriogram demonstrates severe segmental stenosis of 90% plus  at the junction of the middle and proximal third of the left common carotid artery. More distally post stenotic dilatation is seen. Patency is seen of the left internal carotid artery at the bulb to the cranial skull base. The petrous, the cavernous and the supraclinoid segments are widely patent. The left middle cerebral artery and the left anterior cerebral artery opacify into the capillary and venous phases. Non opacification of the left external carotid artery is seen. The dominant left vertebral artery origin is widely patent. The vessel opacifies to the cranial skull base. Patency is seen of the left vertebrobasilar junction, and the left posterior-inferior cerebellar artery. There is retrograde opacification of the left external carotid artery distribution via multiple musculoskeletal collaterals arising from the distal left vertebral artery. More distally the left vertebrobasilar junction, the basilar artery, the posterior cerebral arteries, the superior cerebellar arteries and the anterior-inferior cerebellar arteries is seen into the capillary and venous phases. Retrograde opacification of the right vertebrobasilar junction proximal to the right posterior-inferior cerebellar artery is seen. Partial retrograde opacification via the left posterior communicating artery of the left middle cerebral artery distribution is seen. 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 PLAN: Findings reviewed with the patient and referring neurologist. Given the symptomatic nature of the right common carotid artery stenosis, endovascular revascularization scheduled for early next week. Electronically Signed   By: Luanne Bras M.D.   On: 11/23/2019 16:25      Labs:  CBC: Recent Labs    11/21/19 1855 11/21/19 1905 11/21/19 2115 11/22/19 0347 11/23/19 0225 11/25/19 0450  WBC 11.8*  --   --  10.2 11.9* 14.3*  HGB 12.7*   < > 13.3 10.8* 10.8* 11.7*  HCT 40.5   < > 39.0 32.7* 33.6* 36.0*  PLT 421*  --   --  397 425* 490*   < > = values in this interval not displayed.    COAGS: Recent Labs    11/21/19 2158  INR 1.2  APTT 28  BMP: Recent Labs    10/28/19 1935 10/28/19 1935 10/29/19 0401 10/29/19 0401 10/30/19 0542 10/30/19 0542 10/31/19 0523 11/09/19 2136 11/19/19 0431 11/19/19 0431 11/21/19 1855 11/21/19 1855 11/21/19 1905 11/21/19 1905 11/21/19 2115 11/22/19 0347 11/23/19 0225 11/25/19 0450  NA 136   < > 137   < > 132*   < > 135   < > 138   < > 131*   < > 139  --  138  --  135 134*  K 4.2   < > 4.4   < > 4.4   < > 4.8   < > 3.8   < > 8.3*   < > 4.3  --  3.9  --  3.7 3.9  CL 100   < > 102   < > 99   < > 99   < > 101   < > 99   < > 106  --  103  --  100 100  CO2 25   < > 24   < > 25   < > 25   < > 26  --  21*  --   --   --   --   --  23 22  GLUCOSE 90   < > 96   < > 150*   < > 141*   < > 161*   < > 120*   < > 122*  --  115*  --  125* 110*  BUN 10   < > 9   < > 11   < > 13   < > 18   < > 18   < > 22  --  20  --  14 11  CALCIUM 8.7*   < > 8.8*   < > 8.8*   < > 8.7*   < > 8.8*  --  8.2*  --   --   --   --   --  8.6* 8.7*  CREATININE 0.70   < > 0.81   < > 0.68   < > 0.64   < > 0.62   < > 1.00   < > 0.60*   < > 0.60* 0.52* 0.70 0.69  GFRNONAA >60   < > >60   < > >60   < > >60   < > >60   < > >60  --   --   --   --  >60 >60 >60  GFRAA >60  --  >60  --  >60  --  >60  --   --   --   --   --   --   --   --   --   --   --    < > = values in this interval not displayed.    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 Estanislado Pandy revealing bilateral common carotid artery stenosis- thought to be cause of recent CVA (left-sided weakness- symptomatic from right CCA stenosis). Plan for image-guided cerebral arteriogram with possible revascularization (angioplasty, stent placement) of right common carotid  artery stenosis in IR tentatively for Wednesday 11/28/2019 in IR with Dr. Estanislado Pandy and anesthesia pending IR schedule. Patient will be NPO at midnight prior to procedure. Afebrile. P2Y12 pending at this time. Continue taking Brilinta 90 mg twice daily and Aspirin 81 mg once daily. Will re-assess for possible medication adjustments once P2Y12 resulted. INR 1.2 11/21/2019. COVID negative 11/21/2019.  Spoke with patient's son, Ziggy Chanthavong, via telephone at 1030 to discuss updates on patient's upcoming procedure. All questions answered and concerns addressed.  Risks and benefits of cerebral arteriogram with intervention were discussed with the patient including, but not limited to bleeding, infection, vascular injury, contrast induced renal failure, stroke, reperfusion hemorrhage, or even death. This interventional procedure involves the use of X-rays and because of the nature of the planned procedure, it is possible that we will have prolonged use of X-ray fluoroscopy. Potential radiation risks to you include (but are not limited to) the following: - A slightly elevated risk for cancer  several years later in life. This risk is typically less than 0.5% percent. This risk is low in comparison to the normal incidence of human cancer, which is 33% for women and 50% for men according to the [employer]. - Radiation induced injury can include skin redness, resembling a rash, tissue breakdown / ulcers and hair loss (which can be temporary or permanent).   The likelihood of either of these occurring depends on the difficulty of the procedure and whether you are sensitive to radiation due to previous procedures, disease, or genetic conditions.  IF your procedure requires a prolonged use of radiation, you will be notified and given written instructions for further action.  It is your responsibility to monitor the irradiated area for the 2 weeks following the procedure and to notify your physician if you are concerned that you have suffered a radiation induced injury.   All of the patient's questions were answered, patient is agreeable to proceed. Consent signed and in chart.   Electronically Signed: Earley Abide, PA-C 11/26/2019, 10:57 AM   I spent a total of 35 Minutes at the the patient's bedside AND on the patient's hospital floor or unit, greater than 50% of which was counseling/coordinating care for right common carotid artery stenosis/revascularization.

## 2019-11-26 NOTE — Progress Notes (Signed)
STROKE TEAM PROGRESS NOTE   INTERVAL HISTORY His son is at the bedside. Patient states he still has significant weakness in his left hand muscles though overall left upper extremity strength is improving. He remains short of breath and on oxygen and also had some hematuria after starting aspirin and Brilinta. Timing of his carotid revascularization will likely be determined by his medical stability to withstand the procedure. Long discussion patient and son at the bedside and answered questions. Discussed with Dr. Eliseo Squires as well.  Vitals:   11/25/19 2354 11/26/19 0430 11/26/19 0719 11/26/19 0753  BP: 139/79 (!) 148/65  95/71  Pulse: 98 (!) 102 87 71  Resp: 18  18 20   Temp: 97.6 F (36.4 C) 97.8 F (36.6 C)  98.7 F (37.1 C)  TempSrc: Oral Oral  Axillary  SpO2: 100% 100% 100% 100%  Weight:      Height:       CBC:  Recent Labs  Lab 11/21/19 1855 11/21/19 1905 11/23/19 0225 11/25/19 0450  WBC 11.8*   < > 11.9* 14.3*  NEUTROABS 10.4*  --  10.7*  --   HGB 12.7*   < > 10.8* 11.7*  HCT 40.5   < > 33.6* 36.0*  MCV 87.3   < > 83.2 82.6  PLT 421*   < > 425* 490*   < > = values in this interval not displayed.   Basic Metabolic Panel:  Recent Labs  Lab 11/23/19 0225 11/25/19 0450  NA 135 134*  K 3.7 3.9  CL 100 100  CO2 23 22  GLUCOSE 125* 110*  BUN 14 11  CREATININE 0.70 0.69  CALCIUM 8.6* 8.7*    IMAGING past 24 hours DG Swallowing Func-Speech Pathology  Result Date: 11/25/2019 Objective Swallowing Evaluation: Type of Study: MBS-Modified Barium Swallow Study  Patient Details Name: KYE HEDDEN MRN: [id number] Date of Birth: Jun 07, 1950 Today's Date: 11/25/2019 Time: SLP Start Time (ACUTE ONLY): 1100 -SLP Stop Time (ACUTE ONLY): 1130 SLP Time Calculation (min) (ACUTE ONLY): 30 min Past Medical History: Past Medical History: Diagnosis Date . Anxiety  . Complication of anesthesia   " sometimes I wake up during surgery " . Depression  . Left renal mass 07/19/2016 . Lung cancer  ([place]) dx'd 2010   NSCL CA right . Lung cancer ([place]) dx'd 2017  left Past Surgical History: Past Surgical History: Procedure Laterality Date . AMPUTATION Right 05/21/2012  Procedure: AMPUTATION BELOW KNEE;  Surgeon: Newt Minion, MD;  Location: [place];  Service: Orthopedics;  Laterality: Right; . AMPUTATION Right 06/21/2012  Procedure: Revision AMPUTATION BELOW KNEE Right;  Surgeon: Newt Minion, MD;  Location: [place];  Service: Orthopedics;  Laterality: Right;  Revision right Below Knee Amputation . COLONOSCOPY W/ BIOPSIES AND POLYPECTOMY   . FOOT AMPUTATION Right   traumatic right lower extremity . UPPER JAW    SURGERY FOR INFECTION HPI: [name] is a 69 y.o. 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 Now with worsening dysphagia over past 24-48 hours.  Subjective: alert but not very interactive Assessment / Plan / Recommendation CHL IP CLINICAL IMPRESSIONS 11/25/2019 Clinical Impression Patient presents with a moderate oropharyngeal dysphagia without aspiration but with incident of very  trace penetration during swallow with thin liquids. Oral phase is characterized by weak oral control and movement of boluses, mastication was decreased and resulted in prolonged oral phase with regular solids. He exhibited premature spillage of thin and nectar liquids, delay in swallow initiation to level of vallecular sinus with puree solids and pyriform sinus with thin liquids. Inital swallows of puree solids, regular solids resulted in moderate vallecular residuals and mild pharyngeal residuals. Patient independently initiated swallow which helped clear  residuals and sips of thin liquids helped clear residuals in pharynx to trace to minimal in amount. Overall, patient's airway is well protected during swallow with all tested consistencies and he is safe to initiate oral diet at at this time. SLP Visit Diagnosis Dysphagia, oropharyngeal phase (R13.12) Attention and concentration deficit following -- Frontal lobe and executive function deficit following -- Impact on safety and function Mild aspiration risk   CHL IP TREATMENT RECOMMENDATION 11/25/2019 Treatment Recommendations Therapy as outlined in treatment plan below   Prognosis 11/25/2019 Prognosis for Safe Diet Advancement Good Barriers to Reach Goals -- Barriers/Prognosis Comment -- CHL IP DIET RECOMMENDATION 11/25/2019 SLP Diet Recommendations Thin liquid;Dysphagia 1 (Puree) solids Liquid Administration via Cup;Straw Medication Administration Whole meds with puree Compensations Minimize environmental distractions;Slow rate;Small sips/bites;Follow solids with liquid Postural Changes Remain semi-upright after after feeds/meals (Comment);Seated upright at 90 degrees   CHL IP OTHER RECOMMENDATIONS 11/25/2019 Recommended Consults -- Oral Care Recommendations Oral care BID Other Recommendations --   CHL IP FOLLOW UP RECOMMENDATIONS 11/25/2019 Follow up Recommendations 24 hour supervision/assistance;Skilled Nursing facility;Home health SLP   CHL IP FREQUENCY AND DURATION 11/25/2019 Speech Therapy Frequency (ACUTE ONLY) min 2x/week Treatment Duration --      CHL IP ORAL PHASE 11/25/2019 Oral Phase Impaired Oral - Pudding Teaspoon -- Oral - Pudding Cup -- Oral - Honey Teaspoon -- Oral - Honey Cup -- Oral - Nectar Teaspoon -- Oral - Nectar Cup Weak lingual manipulation;Premature spillage Oral - Nectar Straw -- Oral - Thin Teaspoon -- Oral - Thin Cup Premature spillage Oral - Thin Straw Premature spillage Oral - Puree Premature spillage Oral - Mech Soft -- Oral - Regular Delayed oral transit;Weak lingual manipulation Oral  - Multi-Consistency -- Oral - Pill Impaired mastication;Weak lingual manipulation;Delayed oral transit Oral Phase - Comment --  CHL IP PHARYNGEAL PHASE 11/25/2019 Pharyngeal Phase Impaired Pharyngeal- Pudding Teaspoon -- Pharyngeal -- Pharyngeal- Pudding Cup -- Pharyngeal -- Pharyngeal- Honey Teaspoon -- Pharyngeal -- Pharyngeal- Honey Cup -- Pharyngeal -- Pharyngeal- Nectar Teaspoon -- Pharyngeal -- Pharyngeal- Nectar Cup Delayed swallow initiation-vallecula;Pharyngeal residue - valleculae;Pharyngeal residue - pyriform Pharyngeal -- Pharyngeal- Nectar Straw -- Pharyngeal -- Pharyngeal- Thin Teaspoon -- Pharyngeal -- Pharyngeal- Thin Cup Delayed swallow initiation-vallecula;Delayed swallow initiation-pyriform sinuses;Penetration/Aspiration during swallow;Pharyngeal residue - valleculae;Pharyngeal residue - pyriform Pharyngeal Material enters airway, remains ABOVE vocal cords then ejected out Pharyngeal- Thin Straw Delayed swallow initiation-pyriform sinuses;Delayed swallow initiation-vallecula Pharyngeal -- Pharyngeal- Puree Delayed swallow initiation-vallecula;Pharyngeal residue - valleculae Pharyngeal -- Pharyngeal- Mechanical Soft -- Pharyngeal -- Pharyngeal- Regular Pharyngeal residue - valleculae Pharyngeal -- Pharyngeal- Multi-consistency -- Pharyngeal -- Pharyngeal- Pill WFL Pharyngeal -- Pharyngeal Comment --  CHL IP CERVICAL ESOPHAGEAL PHASE 11/25/2019 Cervical Esophageal Phase WFL Pudding Teaspoon -- Pudding Cup -- Honey Teaspoon -- Honey Cup -- Nectar Teaspoon -- Nectar Cup -- Nectar Straw -- Thin Teaspoon -- Thin Cup -- Thin Straw -- Puree -- Mechanical Soft -- Regular -- Multi-consistency -- Pill -- Cervical Esophageal Comment -- Sonia Baller, MA, CCC-SLP Speech Therapy [facility]  PHYSICAL EXAM stable     Temp:  [97.4 F (36.3 C)-98.7 F (37.1 C)] 98.7 F (37.1 C) (10/25 0753) Pulse Rate:  [71-102] 71 (10/25 0753) Resp:  [16-20] 20 (10/25 0753) BP: (95-148)/(65-88) 95/71  (10/25 0753) SpO2:  [93 %-100 %] 100 % (10/25 0753) FiO2 (%):  [28 %] 28 % (10/25 0719)  General -frail cachectic malnourished looking elderly male in no apparent distress. He has right below-knee amputation.  Ophthalmologic - fundi not visualized due to noncooperation.  Cardiovascular - Regular rhythm and rate.  Respiratory - b/l Rhonchi, secretion in throat with weak cough.  Neuro stable - awake alert, oriented x3.  No aphasia, hypophonic speech with mild dysarthria weak cough, able to name and repeat.  Follows commands.  No gaze preference.  Mild left facial droop, tongue midline.  Left upper extremity 3+/5 deltoid, 3/5 bicep, 3/5 tricep and finger grip. Significant weakness of left hand intrinsic hand muscles. Right upper extremity at least 4/5.  Bilateral lower extremity at least 4/5 with right BKA.  Sensation symmetrical.  Finger-to-nose intact on the right.  Gait not tested.   ASSESSMENT/PLAN Evan [name] is a 69 y.o. male with history of recurrent non small cell lung cancer (stage IIIa status post chemo and radiation), traumatic right knee BKA, recent recurrent pneumonia, left renal mass, known prior bilateral carotid artery stenosis, COPD w/ recent exacerbation d/c 10/19. He has had generalized weakness and poor sleep, chronic swallowing, wt loss with poor apptetite and his son and dtr recently moved in to help him. Plans for prostate bx for which he had stopped his aspirin. On 10/20 [facility] ED with difficulty w/ spatial awareness and vision guided movements, HA, L sided weakness. Transfered to [employer]. [facility].  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 Additional scattered cortical + subcortical B watershed infarcts. Min petechial hemorrhage in L temporooccipital infarct. Old tiny L  cerebellar infarct  CTA head & neck no LVO. 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 suspicion for L IJ thrombosis. B ICA siphon w/ mild stenosis. LLL opacity.   MRA head  Unremarkable   Cerebral angio confirmed bilateral CCA high-grade stenosis  2D Echo EF 55 to 60%  LDL 154   HgbA1c 6.3  VTE prophylaxis - Lovenox 40 mg sq daily   off aspirin for planned prostate bx prior to admission, now on aspirin 81 mg daily and brilinta 90 bid.   Therapy recommendations:  Skilled Nursing Facility   Disposition:  pending   Respiratory Distress / Agitation  Increased WOB 10/21 evening  Put on BIPAP w/ relief  Changed level to progressive care   Treated w/ ativan and haldol. Good relief w/ haldol  Carotid Stenosis   CTA neck 02/2017 Long segment of severe 80% right and severe 90% left proximal  common carotid artery stenosis.  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  Cerebral angio confirmed bilateral CCA high-grade stenosis  Plan for right CCA - stenting planned for 10/27 Wednesday   Now on ASA and brilinta   L parietooccipital lesion  MRI w/ severely degraded, enhancement L temporooccipital lobe, repeat recommended  Likely due to infarction  MRI with contrast nondiagnostic - motion due to SOB lying flat L IJ thrombosis  No prior line placement in neck  per family, history of for Port-A-Cath on right chest per patient  CTA neck in 2019 also suspicious for left IJ thrombosis, however not for certain - discussed with Dr. 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 Avoid low BP . No apparent CHF history . Stopped Flomax to see if pressure improves. . On IVF  . Long-term BP goal 130-150 given carotid stenosis.   Hyperlipidemia  Home meds:  No statin  Now on lipitor 80  LDL 154, goal < 70  Continue statin at discharge  Non Small cell lung CA  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   recurrence on the left side presented as a stage IIIa non-small cell lung cancer, squamous cell carcinoma status post concurrent chemoradiation followed by consolidation chemotherapy. Completed in March 2017.  He also underwent SBRT to the right middle lobe lung nodule under the care of Dr. [name] completed on 07/24/2018.  He also underwent SBRT to left lower lobe lung nodule under the care of Dr. [name] completed on April 27, 2019.  Recent scan showed no concerning findings for disease progression  Followed with Dr. Julien Nordmann  Other Stroke Risk Factors  Advanced age  Cigarette smoker, quit 7 mos ago, advised to stop smoking  Other Active Problems  Recent COPD exacerbation, d/c 10/19  Recent recurrent PNA. CXR - slight increase in R basilar opacity possible atx vs infiltrate  Hematuria with urinary retention. Foley. Flomax. This may be a concern on DAPT post stent placement,  Marked enlargement of the Prostate w/ plans for bx    Cachexia, Body mass index is 17.64 kg/m.   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 He may benefit with elective revascularization pending medical  stability. Long discussion with patient, son and Dr. Lucianne Lei and answered questions. Recommend urology consult for hematuria as he appears to have longstanding urinary problems and may need to stay on dual antiplatelet therapy following carotid revascularization which may further at accentuate this problem. Greater than 50% time during this 25-minute visit was spent in counseling and coordination of care and discussion about his carotid stenosis and revascularization treatment and answering questions. Antony Contras, MD Medical Director [facility] Pager: [phone] 11/26/2019 1:43 PM  To contact Stroke Continuity provider, please refer to [email or web address] After hours, contact General Neurology

## 2019-11-26 NOTE — Plan of Care (Signed)
Patient has been pleasant to work with. Family member (son) at bedside is supportive and pleasant with staff.  Problem: Education: Goal: Knowledge of disease or condition will improve Outcome: Progressing   Problem: Coping: Goal: Will verbalize positive feelings about self Outcome: Progressing Goal: Will identify appropriate support needs Outcome: Progressing   Problem: Health Behavior/Discharge Planning: Goal: Ability to manage health-related needs will improve Outcome: Progressing   Problem: Self-Care: Goal: Ability to participate in self-care as condition permits will improve Outcome: Progressing Goal: Verbalization of feelings and concerns over difficulty with self-care will improve Outcome: Progressing Goal: Ability to communicate needs accurately will improve Outcome: Progressing   Problem: Nutrition: Goal: Risk of aspiration will decrease Outcome: Progressing   Problem: Ischemic Stroke/TIA Tissue Perfusion: Goal: Complications of ischemic stroke/TIA will be minimized Outcome: Progressing   Problem: Education: Goal: Knowledge of General Education information will improve Description: Including pain rating scale, medication(s)/side effects and non-pharmacologic comfort measures Outcome: Progressing   Problem: Health Behavior/Discharge Planning: Goal: Ability to manage health-related needs will improve Outcome: Progressing   Problem: Clinical Measurements: Goal: Ability to maintain clinical measurements within normal limits will improve Outcome: Progressing Goal: Will remain free from infection Outcome: Progressing Goal: Diagnostic test results will improve Outcome: Progressing Goal: Respiratory complications will improve Outcome: Progressing Goal: Cardiovascular complication will be avoided Outcome: Progressing   Problem: Activity: Goal: Risk for activity intolerance will decrease Outcome: Progressing   Problem: Nutrition: Goal: Adequate nutrition will be  maintained Outcome: Progressing   Problem: Coping: Goal: Level of anxiety will decrease Outcome: Progressing   Problem: Elimination: Goal: Will not experience complications related to bowel motility Outcome: Progressing Goal: Will not experience complications related to urinary retention Outcome: Progressing   Problem: Pain Managment: Goal: General experience of comfort will improve Outcome: Progressing   Problem: Safety: Goal: Ability to remain free from injury will improve Outcome: Progressing   Problem: Skin Integrity: Goal: Risk for impaired skin integrity will decrease Outcome: Progressing

## 2019-11-26 NOTE — Progress Notes (Signed)
   11/26/19 1640  Vitals  Temp 98.1 F (36.7 C)  Temp Source Oral  BP (!) 135/98  MAP (mmHg) 111  BP Location Left Arm  BP Method Automatic  Patient Position (if appropriate) Lying  Pulse Rate (!) 110  Pulse Rate Source Dinamap  Resp (!) 22  Level of Consciousness  Level of Consciousness Alert  MEWS COLOR  MEWS Score Color Yellow  Oxygen Therapy  SpO2 100 %  O2 Device Nasal Cannula  O2 Flow Rate (L/min) 2 L/min  Patient Activity (if Appropriate) In bed  Pulse Oximetry Type Continuous  Pain Assessment  Pain Scale 0-10  Pain Score 0  MEWS Score  MEWS Temp 0  MEWS Systolic 0  MEWS Pulse 1  MEWS RR 1  MEWS LOC 0  MEWS Score 2   Pt complained of increasing anxiety. According to the pt, the increasing anxiety is related his pneumonia, SOB and upcoming stent procedure scheduled for Wednesday, 11/28/19. PRN Hydroxyzine 25 mg IM given at 1730. MEWS changed to yellow d/t BP, elevated HR and respiration rate. Will continue to follow MEWS protocol and reassess patient.

## 2019-11-26 NOTE — Progress Notes (Signed)
Progress Note    Evan [name]  QMG:[id number] DOB: 1950/10/27  DOA: 11/21/2019 PCP: Patient, No Pcp Per    Brief Narrative:    Medical records reviewed and are as summarized below:  Evan [name] is an 69 y.o. 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  Found to have a CVA. Work up in process.   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   CT head showed a small area of low density in the left parietal lobe compatible with acute to subacute infarction.  MRI of the brain confirmed acute ischemic infarct involving the left temporaloccipital region.  Stroke work-up initiated.  Neurology following. PT/OT/speech evaluation pending -  LDL of 154-statin -  Hemoglobin A1c 6.3 Echo : Left ventricular ejection fraction, by estimation, is 55 to 60%. The  left ventricle has normal function. The left ventricle has no regional  wall motion abnormalities. Left ventricular diastolic parameters are  consistent with Grade I diastolic dysfunction (impaired relaxation). Elevated left atrial pressure. CTA head and neck did not show any large vessel occlusion but showed chronic Severe bilateral Common Carotid Artery stenosis in the lower  neck, likely post radiation related. Bilateral radiographic string signs, stable on the left but progressed on the right since last CTA study.  - IR consulted for cerebral angiogram: Plan for image-guided cerebral arteriogram with possible revascularization (angioplasty/stent placement) of right common carotid artery stenosis next week in IR- will determine exact date/time based on anesthesia availability.  Continue taking Brilinta 90 mg twice daily and Aspirin 81 mg once daily.   Dysphagia/concern for aspiration/wet vocals -SLP eval -MBBS: DYS 1 diet -repeated x ray: possible aspiration PNA on right-- WBC trending up so will add IV Abx -? Need for ENT eval- discussed with Dr. Redmond Baseman: recommends outpatient follow up -trial of mucomyst to thin secretion   History of lung cancer:  -Currently in remission.  Stage IIIa, treated with chemotherapy.  Follows with oncology, Dr. Julien Nordmann.  COPD: He was hospitalized recently and was discharged on 11/20/19 after management of COPD exacerbation, pneumonia.  He completed course of Levaquin and was discharged on tapering prednisone dose.  Continue bronchodilators.   -He continues to have wet rhonchi-- suspect upper airway -PRN bipap -nebs -pulm toilet  Hypertension:  -BP on the low side  History of smoking: Continue nicotine patch -encourage cessation  Hematuria -2 recent ER visits for same -will order cytology and continue to monitor -outpatient urology follow up in this smoker- Dr. Abner Greenspan -no clots seen- could need a larger foley and CBI if clots become an issue  TSH high -check free t4   Patient with multiple medical issues- carotid stenosis- needing stent and   Family Communication/Anticipated D/C date and plan/Code Status   DVT prophylaxis: Lovenox ordered. Code Status:  Full Code.  Family Communication: at bedside Disposition Plan: Status is: Inpatient  Remains inpatient appropriate because:Inpatient level of care appropriate due  to severity of illness   Dispo: The patient is from: Home              Anticipated d/c is to: tbd- SNF?              Anticipated d/c date is: > 3 days              Patient currently is not medically stable to d/c. stent placed on Wednesday         Medical Consultants:    Neurology  IR     Subjective:   More awake and talkative today  Objective:    Vitals:   11/26/19 0430 11/26/19 0719 11/26/19 0753 11/26/19 1212  BP: (!) 148/65  95/71 126/73  Pulse: (!) 102 87 71 (!) 106  Resp:  18 20 20   Temp: 97.8 F (36.6 C)  98.7 F (37.1 C) (!) 97.3 F (36.3 C)  TempSrc: Oral  Axillary Oral  SpO2: 100% 100% 100% 100%  Weight:      Height:        Intake/Output Summary (Last 24 hours) at 11/26/2019 1219 Last data filed at 11/26/2019 1216 Gross per 24 hour  Intake 623.22 ml  Output 900 ml  Net -276.78 ml   Filed Weights   11/21/19 1819  Weight: 48.1 kg    Exam:  General: Appearance:    Thin/chronically ill appearing male in no acute distress     Lungs:     Rhonchi from upper airways, respirations unlabored  Heart:    Tachycardic. Normal rhythm. No murmurs, rubs, or gallops.   MS:    Right BK amputation noted.   Neurologic:   Awake, alert     Data Reviewed:   I have personally reviewed following labs and imaging studies:  Labs: Labs show the following:   Basic Metabolic Panel: Recent Labs  Lab 11/21/19 1855 11/21/19 1855 11/21/19 1905 11/21/19 1905 11/21/19 2115 11/21/19 2115 11/22/19 0347 11/23/19 0225 11/25/19 0450  NA 131*  --  139  --  138  --   --  135 134*  K 8.3*   < > 4.3   < > 3.9   < >  --  3.7 3.9  CL 99  --  106  --  103  --   --  100 100  CO2 21*  --   --   --   --   --   --  23 22  GLUCOSE 120*  --  122*  --  115*  --   --  125* 110*  BUN 18  --  22  --  20  --   --  14 11  CREATININE 1.00   < > 0.60*  --  0.60*  --  0.52* 0.70 0.69  CALCIUM 8.2*  --   --   --   --   --   --  8.6* 8.7*   < > = values in this interval not  displayed.   GFR Estimated Creatinine Clearance: 60.1 mL/min (by C-G formula based on SCr of 0.69 mg/dL). Liver Function Tests: Recent Labs  Lab 11/21/19 1855  AST 71*  ALT 94*  ALKPHOS 89  BILITOT <0.1*  PROT 6.3*  ALBUMIN 2.9*   No results for input(s): LIPASE, AMYLASE in the last 168 hours. No results for input(s): AMMONIA in the  last 168 hours. Coagulation profile Recent Labs  Lab 11/21/19 2158  INR 1.2    CBC: Recent Labs  Lab 11/21/19 1855 11/21/19 1905 11/21/19 2115 11/22/19 0347 11/23/19 0225 11/25/19 0450 11/26/19 1021  WBC 11.8*  --   --  10.2 11.9* 14.3* 14.0*  NEUTROABS 10.4*  --   --   --  10.7*  --   --   HGB 12.7*   < > 13.3 10.8* 10.8* 11.7* 11.3*  HCT 40.5   < > 39.0 32.7* 33.6* 36.0* 34.7*  MCV 87.3  --   --  84.5 83.2 82.6 84.0  PLT 421*  --   --  397 425* 490* 508*   < > = values in this interval not displayed.   Cardiac Enzymes: No results for input(s): CKTOTAL, CKMB, CKMBINDEX, TROPONINI in the last 168 hours. BNP (last 3 results) No results for input(s): PROBNP in the last 8760 hours. CBG: Recent Labs  Lab 11/19/19 2232 11/21/19 1841 11/22/19 1052  GLUCAP 160* 128* 92   D-Dimer: No results for input(s): DDIMER in the last 72 hours. Hgb A1c: No results for input(s): HGBA1C in the last 72 hours. Lipid Profile: No results for input(s): CHOL, HDL, LDLCALC, TRIG, CHOLHDL, LDLDIRECT in the last 72 hours. Thyroid function studies: No results for input(s): TSH, T4TOTAL, T3FREE, THYROIDAB in the last 72 hours.  Invalid input(s): FREET3 Anemia work up: No results for input(s): VITAMINB12, FOLATE, FERRITIN, TIBC, IRON, RETICCTPCT in the last 72 hours. Sepsis Labs: Recent Labs  Lab 11/22/19 0347 11/23/19 0225 11/25/19 0450 11/26/19 1021  WBC 10.2 11.9* 14.3* 14.0*    Microbiology Recent Results (from the past 240 hour(s))  Resp Panel by RT PCR (RSV, Flu A&B, Covid) - Nasopharyngeal Swab     Status: None   Collection Time:  11/21/19  7:08 PM   Specimen: Nasopharyngeal Swab  Result Value Ref Range Status   SARS Coronavirus 2 by RT PCR NEGATIVE NEGATIVE Final    Comment: (NOTE) SARS-CoV-2 target nucleic acids are NOT DETECTED.  The SARS-CoV-2 RNA is generally detectable in upper respiratoy specimens during the acute phase of infection. The lowest concentration of SARS-CoV-2 viral copies this assay can detect is 131 copies/mL. A negative result does not preclude SARS-Cov-2 infection and should not be used as the sole basis for treatment or other patient management decisions. A negative result may occur with  improper specimen collection/handling, submission of specimen other than nasopharyngeal swab, presence of viral mutation(s) within the areas targeted by this assay, and inadequate number of viral copies (<131 copies/mL). A negative result must be combined with clinical observations, patient history, and epidemiological information. The expected result is Negative.  Fact Sheet for Patients:  PinkCheek.be  Fact Sheet for Healthcare Providers:  GravelBags.it  This test is no t yet approved or cleared by the [place] FDA and  has been authorized for detection and/or diagnosis of SARS-CoV-2 by FDA under an Emergency Use Authorization (EUA). This EUA will remain  in effect (meaning this test can be used) for the duration of the COVID-19 declaration under Section 564(b)(1) of the Act, 21 U.S.C. section 360bbb-3(b)(1), unless the authorization is terminated or revoked sooner.     Influenza A by PCR NEGATIVE NEGATIVE Final   Influenza B by PCR NEGATIVE NEGATIVE Final    Comment: (NOTE) The Xpert Xpress SARS-CoV-2/FLU/RSV assay is intended as an aid in  the diagnosis of influenza from Nasopharyngeal swab specimens and  should not be used as a sole  basis for treatment. Nasal washings and  aspirates are unacceptable for Xpert Xpress  SARS-CoV-2/FLU/RSV  testing.  Fact Sheet for Patients: PinkCheek.be  Fact Sheet for Healthcare Providers: GravelBags.it  This test is not yet approved or cleared by the [place] FDA and  has been authorized for detection and/or diagnosis of SARS-CoV-2 by  FDA under an Emergency Use Authorization (EUA). This EUA will remain  in effect (meaning this test can be used) for the duration of the  Covid-19 declaration under Section 564(b)(1) of the Act, 21  U.S.C. section 360bbb-3(b)(1), unless the authorization is  terminated or revoked.    Respiratory Syncytial Virus by PCR NEGATIVE NEGATIVE Final    Comment: (NOTE) Fact Sheet for Patients: PinkCheek.be  Fact Sheet for Healthcare Providers: GravelBags.it  This test is not yet approved or cleared by the [place] FDA and  has been authorized for detection and/or diagnosis of SARS-CoV-2 by  FDA under an Emergency Use Authorization (EUA). This EUA will remain  in effect (meaning this test can be used) for the duration of the  COVID-19 declaration under Section 564(b)(1) of the Act, 21 U.S.C.  section 360bbb-3(b)(1), unless the authorization is terminated or  revoked. Performed at [facility], [place] [address], [place], [place] [address]     Procedures and diagnostic studies:  DG CHEST PORT 1 VIEW  Result Date: 11/25/2019 CLINICAL DATA:  Pneumonia, history of lung cancer. EXAM: PORTABLE CHEST 1 VIEW COMPARISON:  November 22, 2019. FINDINGS: Stable cardiomediastinal silhouette. No pneumothorax or pleural effusion is noted. Left lung is clear. Slightly increased right basilar opacity is noted concerning for possible atelectasis or infiltrate. Stable bilateral hilar findings consistent with retraction or fibrosis. Bony thorax is unremarkable. IMPRESSION: Slightly increased right basilar opacity is  noted concerning for possible atelectasis or infiltrate. Electronically Signed   By: Marijo Conception M.D.   On: 11/25/2019 12:24   DG Swallowing Func-Speech Pathology  Result Date: 11/25/2019 Objective Swallowing Evaluation: Type of Study: MBS-Modified Barium Swallow Study  Patient Details Name: DASHIEL BERGQUIST MRN: [id number] Date of Birth: November 25, 1950 Today's Date: 11/25/2019 Time: SLP Start Time (ACUTE ONLY): 1100 -SLP Stop Time (ACUTE ONLY): 1130 SLP Time Calculation (min) (ACUTE ONLY): 30 min Past Medical History: Past Medical History: Diagnosis Date . Anxiety  . Complication of anesthesia   " sometimes I wake up during surgery " . Depression  . Left renal mass 07/19/2016 . Lung cancer ([place]) dx'd 2010   NSCL CA right . Lung cancer ([place]) dx'd 2017  left Past Surgical History: Past Surgical History: Procedure Laterality Date . AMPUTATION Right 05/21/2012  Procedure: AMPUTATION BELOW KNEE;  Surgeon: Newt Minion, MD;  Location: [facility];  Service: Orthopedics;  Laterality: Right; . AMPUTATION Right 06/21/2012  Procedure: Revision AMPUTATION BELOW KNEE Right;  Surgeon: Newt Minion, MD;  Location: [facility];  Service: Orthopedics;  Laterality: Right;  Revision right Below Knee Amputation . COLONOSCOPY W/ BIOPSIES AND POLYPECTOMY   . FOOT AMPUTATION Right   traumatic right lower extremity . UPPER JAW    SURGERY FOR INFECTION HPI: WISE FEES is a 69 y.o. 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 Now with worsening dysphagia over past  24-48 hours.  Subjective: alert but not very interactive Assessment / Plan / Recommendation CHL IP CLINICAL  IMPRESSIONS 11/25/2019 Clinical Impression Patient presents with a moderate oropharyngeal dysphagia without aspiration but with incident of very trace penetration during swallow with thin liquids. Oral phase is characterized by weak oral control and movement of boluses, mastication was decreased and resulted in prolonged oral phase with regular solids. He exhibited premature spillage of thin and nectar liquids, delay in swallow initiation to level of vallecular sinus with puree solids and pyriform sinus with thin liquids. Inital swallows of puree solids, regular solids resulted in moderate vallecular residuals and mild pharyngeal residuals. Patient independently initiated swallow which helped clear residuals and sips of thin liquids helped clear residuals in pharynx to trace to minimal in amount. Overall, patient's airway is well protected during swallow with all tested consistencies and he is safe to initiate oral diet at at this time. SLP Visit Diagnosis Dysphagia, oropharyngeal phase (R13.12) Attention and concentration deficit following -- Frontal lobe and executive function deficit following -- Impact on safety and function Mild aspiration risk   CHL IP TREATMENT RECOMMENDATION 11/25/2019 Treatment Recommendations Therapy as outlined in treatment plan below   Prognosis 11/25/2019 Prognosis for Safe Diet Advancement Good Barriers to Reach Goals -- Barriers/Prognosis Comment -- CHL IP DIET RECOMMENDATION 11/25/2019 SLP Diet Recommendations Thin liquid;Dysphagia 1 (Puree) solids Liquid Administration via Cup;Straw Medication Administration Whole meds with puree Compensations Minimize environmental distractions;Slow rate;Small sips/bites;Follow solids with liquid Postural Changes Remain semi-upright after after feeds/meals (Comment);Seated upright at 90 degrees   CHL IP OTHER RECOMMENDATIONS 11/25/2019 Recommended Consults -- Oral Care Recommendations Oral care BID Other Recommendations --   CHL IP FOLLOW UP  RECOMMENDATIONS 11/25/2019 Follow up Recommendations 24 hour supervision/assistance;Skilled Nursing facility;Home health SLP   CHL IP FREQUENCY AND DURATION 11/25/2019 Speech Therapy Frequency (ACUTE ONLY) min 2x/week Treatment Duration --      CHL IP ORAL PHASE 11/25/2019 Oral Phase Impaired Oral - Pudding Teaspoon -- Oral - Pudding Cup -- Oral - Honey Teaspoon -- Oral - Honey Cup -- Oral - Nectar Teaspoon -- Oral - Nectar Cup Weak lingual manipulation;Premature spillage Oral - Nectar Straw -- Oral - Thin Teaspoon -- Oral - Thin Cup Premature spillage Oral - Thin Straw Premature spillage Oral - Puree Premature spillage Oral - Mech Soft -- Oral - Regular Delayed oral transit;Weak lingual manipulation Oral - Multi-Consistency -- Oral - Pill Impaired mastication;Weak lingual manipulation;Delayed oral transit Oral Phase - Comment --  CHL IP PHARYNGEAL PHASE 11/25/2019 Pharyngeal Phase Impaired Pharyngeal- Pudding Teaspoon -- Pharyngeal -- Pharyngeal- Pudding Cup -- Pharyngeal -- Pharyngeal- Honey Teaspoon -- Pharyngeal -- Pharyngeal- Honey Cup -- Pharyngeal -- Pharyngeal- Nectar Teaspoon -- Pharyngeal -- Pharyngeal- Nectar Cup Delayed swallow initiation-vallecula;Pharyngeal residue - valleculae;Pharyngeal residue - pyriform Pharyngeal -- Pharyngeal- Nectar Straw -- Pharyngeal -- Pharyngeal- Thin Teaspoon -- Pharyngeal -- Pharyngeal- Thin Cup Delayed swallow initiation-vallecula;Delayed swallow initiation-pyriform sinuses;Penetration/Aspiration during swallow;Pharyngeal residue - valleculae;Pharyngeal residue - pyriform Pharyngeal Material enters airway, remains ABOVE vocal cords then ejected out Pharyngeal- Thin Straw Delayed swallow initiation-pyriform sinuses;Delayed swallow initiation-vallecula Pharyngeal -- Pharyngeal- Puree Delayed swallow initiation-vallecula;Pharyngeal residue - valleculae Pharyngeal -- Pharyngeal- Mechanical Soft -- Pharyngeal -- Pharyngeal- Regular Pharyngeal residue - valleculae Pharyngeal --  Pharyngeal- Multi-consistency -- Pharyngeal -- Pharyngeal- Pill WFL Pharyngeal -- Pharyngeal Comment --  CHL IP CERVICAL ESOPHAGEAL PHASE 11/25/2019 Cervical Esophageal Phase WFL Pudding Teaspoon -- Pudding Cup -- Honey Teaspoon -- Honey Cup -- Nectar Teaspoon -- Nectar Cup -- Nectar Straw -- Thin  Teaspoon -- Thin Cup -- Thin Straw -- Puree -- Mechanical Soft -- Regular -- Multi-consistency -- Pill -- Cervical Esophageal Comment -- Sonia Baller, MA, CCC-SLP Speech Therapy [facility]              Medications:   .  stroke: mapping our early stages of recovery book   Does not apply Once  . aspirin EC  81 mg Oral Daily  . atorvastatin  80 mg Oral Daily  . budesonide  0.5 mg Nebulization BID  . Chlorhexidine Gluconate Cloth  6 each Topical Daily  . enoxaparin (LOVENOX) injection  40 mg Subcutaneous Q24H  . feeding supplement  237 mL Oral TID BM  . guaiFENesin  600 mg Oral BID  . multivitamin with minerals  1 tablet Oral Daily  . nicotine  14 mg Transdermal Daily  . pantoprazole (PROTONIX) IV  40 mg Intravenous Q12H  . ticagrelor  90 mg Oral BID   Continuous Infusions: . ampicillin-sulbactam (UNASYN) IV 200 mL/hr at 11/26/19 1205     LOS: 5 days   Geradine Girt  [facility]   How to contact the [facility] Attending or Consulting provider [place] or covering provider during after hours [place], for this patient?  1. Check the care team in [facility] and look for a) attending/consulting TRH provider listed and b) the [facility] team listed 2. Log into [email or web address] and use [place]'s universal password to access. If you do not have the password, please contact the hospital operator. 3. Locate the [facility] provider you are looking for under [facility] and page to a number that you can be directly reached. 4. If you still have difficulty reaching the provider, please page the [facility] (Director on Call) for the Hospitalists listed on amion for assistance.  11/26/2019, 12:19 PM

## 2019-11-26 NOTE — Progress Notes (Signed)
  Speech Language Pathology Treatment: Dysphagia  Patient Details Name: RAJVEER HANDLER MRN: [id number] DOB: [birth year] Today's Date: 11/26/2019 Time: 1205-1220 SLP Time Calculation (min) (ACUTE ONLY): 15 min  Assessment / Plan / Recommendation Clinical Impression  Patient seen to address dysphagia goals. Patient's son present at beginning of session and he reported that patient ate some grits this morning but has not had much overall. Patient consumed sips of thin liquids without overt s/s of aspiration or penetration. He continues to sound rhonchi and he would intermittently pat his chest in attempt to help work this congestion out. He was able to return demonstrate to perform EMST device at resistance of 81mL . He then found his flutter valve which was in his bed and demonstrated use of that as well as use of incentive spirometer. SLP recommended patient continue using these breathing exercise devices. Patient in agreement and demonstrated ability to manage all three.   HPI HPI: VERDUN RACKLEY is a 69 y.o. 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 Now with worsening dysphagia over past 24-48 hours.      SLP Plan  Continue with current plan of care       Recommendations  Diet recommendations: Dysphagia 2 (fine chop) Liquids provided via: Cup;Straw Medication Administration: Crushed with puree Compensations: Minimize environmental distractions;Slow rate;Small sips/bites;Follow solids with liquid Postural Changes and/or Swallow Maneuvers: Seated upright 90 degrees                Oral Care Recommendations: Oral care  BID Follow up Recommendations: 24 hour supervision/assistance;Skilled Nursing facility;Home health SLP SLP Visit Diagnosis: Dysphagia, oropharyngeal phase (R13.12) Plan: Continue with current plan of care       [place], MA, [place] [facility]

## 2019-11-27 ENCOUNTER — Inpatient Hospital Stay (HOSPITAL_COMMUNITY): Payer: Medicare PPO | Admitting: Anesthesiology

## 2019-11-27 ENCOUNTER — Other Ambulatory Visit: Payer: Self-pay | Admitting: Student

## 2019-11-27 ENCOUNTER — Ambulatory Visit: Payer: Medicare PPO | Admitting: Family

## 2019-11-27 ENCOUNTER — Other Ambulatory Visit: Payer: Self-pay | Admitting: Radiology

## 2019-11-27 DIAGNOSIS — I639 Cerebral infarction, unspecified: Secondary | ICD-10-CM | POA: Diagnosis not present

## 2019-11-27 DIAGNOSIS — I10 Essential (primary) hypertension: Secondary | ICD-10-CM | POA: Diagnosis not present

## 2019-11-27 LAB — BASIC METABOLIC PANEL
Anion gap: 9 (ref 5–15)
BUN: 10 mg/dL (ref 8–23)
CO2: 25 mmol/L (ref 22–32)
Calcium: 8.4 mg/dL — ABNORMAL LOW (ref 8.9–10.3)
Chloride: 102 mmol/L (ref 98–111)
Creatinine, Ser: 0.62 mg/dL (ref 0.61–1.24)
GFR, Estimated: >60 mL/min (ref >60)
Glucose, Bld: 135 mg/dL — ABNORMAL HIGH (ref 70–99)
Potassium: 3.5 mmol/L (ref 3.5–5.1)
Sodium: 136 mmol/L (ref 135–145)

## 2019-11-27 LAB — CYTOLOGY - NON PAP

## 2019-11-27 LAB — CBC
HCT: 31.3 % — ABNORMAL LOW (ref 39.0–52.0)
Hemoglobin: 10.1 g/dL — ABNORMAL LOW (ref 13.0–17.0)
MCH: 26.9 pg (ref 26.0–34.0)
MCHC: 32.3 g/dL (ref 30.0–36.0)
MCV: 83.2 fL (ref 80.0–100.0)
Platelets: 440 10*3/uL — ABNORMAL HIGH (ref 150–400)
RBC: 3.76 MIL/uL — ABNORMAL LOW (ref 4.22–5.81)
RDW: 16.6 % — ABNORMAL HIGH (ref 11.5–15.5)
WBC: 12.1 10*3/uL — ABNORMAL HIGH (ref 4.0–10.5)
nRBC: 0 % (ref 0.0–0.2)

## 2019-11-27 LAB — T4, FREE: Free T4: 1.03 ng/dL (ref 0.61–1.12)

## 2019-11-27 MED ORDER — HYDROXYZINE HCL 25 MG PO TABS
25.0000 mg | ORAL_TABLET | Freq: Three times a day (TID) | ORAL | Status: DC | PRN
  Administered 2019-11-27 – 2019-12-12 (×16): 25 mg via ORAL
  Filled 2019-11-27 (×18): qty 1

## 2019-11-27 MED ORDER — SODIUM CHLORIDE 0.9 % IV SOLN: INTRAVENOUS | Status: DC

## 2019-11-27 MED ORDER — ASPIRIN EC 325 MG PO TBEC: 325.0000 mg | DELAYED_RELEASE_TABLET | ORAL | Status: DC

## 2019-11-27 MED ORDER — CEFAZOLIN SODIUM-DEXTROSE 2-4 GM/100ML-% IV SOLN: 2.0000 g | INTRAVENOUS | Status: DC

## 2019-11-27 MED ORDER — TAMSULOSIN HCL 0.4 MG PO CAPS
0.4000 mg | ORAL_CAPSULE | Freq: Every day | ORAL | Status: DC
  Administered 2019-11-27 – 2019-12-11 (×15): 0.4 mg via ORAL
  Filled 2019-11-27 (×15): qty 1

## 2019-11-27 MED ORDER — NIMODIPINE 30 MG PO CAPS: 0.0000 mg | ORAL_CAPSULE | ORAL | Status: DC

## 2019-11-27 MED ORDER — LORAZEPAM 2 MG/ML IJ SOLN
0.5000 mg | INTRAMUSCULAR | Status: DC | PRN
  Administered 2019-11-27 – 2019-12-08 (×3): 0.5 mg via INTRAVENOUS
  Filled 2019-11-27 (×4): qty 1

## 2019-11-27 MED ORDER — CEFAZOLIN SODIUM-DEXTROSE 2-4 GM/100ML-% IV SOLN
2.0000 g | INTRAVENOUS | Status: DC
  Filled 2019-11-27: qty 100

## 2019-11-27 MED ORDER — LIDOCAINE HCL URETHRAL/MUCOSAL 2 % EX GEL
1.0000 "application " | Freq: Once | CUTANEOUS | Status: AC
  Administered 2019-11-27: 1 via URETHRAL
  Filled 2019-11-27: qty 11

## 2019-11-27 MED ORDER — TICAGRELOR 90 MG PO TABS
45.0000 mg | ORAL_TABLET | Freq: Two times a day (BID) | ORAL | Status: DC
  Administered 2019-11-27 – 2019-11-28 (×3): 45 mg via ORAL
  Filled 2019-11-27 (×2): qty 1

## 2019-11-27 NOTE — Progress Notes (Signed)
Occupational Therapy Treatment Patient Details Name: Evan [name] MRN: [id number] DOB: Sep 17, 1950 Today's Date: 11/27/2019    History of present illness 69 y.o. male with medical history significant of lung cancer apparently stage IIIa status post chemo now in remission, recent hospitalization with recurrent pneumonia, COPD, depression, left renal mass, anxiety disorder, and traumatic R BKA, presents with confusion, weakness, and mild aphasia. CT and MRI demonstrate new left parieto-occipital infarct. On 11/22/2019 pt developing agitation and respiratory distress requiring BiPAP and haldol. Pt underwent bilateral cerebral arteriogram on 11/23/2019 demonstrating severe bilateral common carotid artery stenosis at proximal/mid levels.   OT comments  Pt seen for follow up OT session with focus on OOB mobility progression. Continue to note audible wet breath sounds throughout session- RN reports he was deep suctioned x2 last PM. Even with upright sitting, pt continues to have weak cough to clear chest congestion. SpO2 stable despite breath sounds. Pt was able to complete bed mobility and squat pivot transfer to recliner with max A +2. Continues to present with LUE weakness, has some motor control at shoulder girdle. Cognitive deficits in executive functioning continue to be noted. D/c recs remain appropriate for SNF. Will continue to follow.   Follow Up Recommendations  SNF;Supervision/Assistance - 24 hour    Equipment Recommendations  3 in 1 bedside commode    Recommendations for Other Services      Precautions / Restrictions Precautions Precautions: Fall Precaution Comments: Rt BKA (prosthesis in room) Restrictions Weight Bearing Restrictions: No       Mobility Bed Mobility Overal bed mobility: Needs Assistance Bed Mobility: Supine to Sit     Supine to sit: Max assist;+2 for physical assistance     General bed mobility comments: max +2 assist for trunk and LE management,  scooting to EOB, and righting posture to midline. Preference for anterior leaning when PT/OT requests pt right posture.  Transfers Overall transfer level: Needs assistance Equipment used: 2 person hand held assist Transfers: Sit to/from [facility] Sit to Stand: Max assist;+2 physical assistance Stand pivot transfers: Max assist;+2 physical assistance       General transfer comment: Max +2 for power up, hip extension, LLE blocking, and safe transfer towards L to drop arm recliner. Assist for boost back in recliner.    Balance Overall balance assessment: Needs assistance Sitting-balance support: Single extremity supported;Feet supported Sitting balance-Leahy Scale: Poor Sitting balance - Comments: LUE placed in WB positioning for balance and proprioceptive input, R lateral and anterior truncal leaning requiring at least min assist to maintain upright   Standing balance support: During functional activity Standing balance-Leahy Scale: Zero Standing balance comment: max A +2 with OT/PT assist for squat pivot                           ADL either performed or assessed with clinical judgement   ADL Overall ADL's : Needs assistance/impaired     Grooming: Set up;Sitting                   Toilet Transfer: Maximal assistance;Squat-pivot;BSC Toilet Transfer Details (indicate cue type and reason): simulated to recliner chair with drop arm         Functional mobility during ADLs: Maximal assistance;+2 for physical assistance;+2 for safety/equipment (squat pivot only) General ADL Comments: session focused on OOB mobility progression to support respiratory function. Pt continues to have audible wet respiratory sounds     Vision Patient Visual Report: No change  from baseline Additional Comments: will continue to further assess, was able to turn and look in R/L but maintains R preference in activity   Perception     Praxis      Cognition  Arousal/Alertness: Awake/alert Behavior During Therapy: WFL for tasks assessed/performed;Flat affect Overall Cognitive Status: Impaired/Different from baseline Area of Impairment: Attention;Following commands;Safety/judgement;Awareness;Problem solving                   Current Attention Level: Sustained   Following Commands: Follows one step commands with increased time Safety/Judgement: Decreased awareness of safety;Decreased awareness of deficits Awareness: Emergent Problem Solving: Slow processing;Difficulty sequencing;Requires verbal cues;Requires tactile cues General Comments: requires cues to stay on task as well as increased time for problem solving        Exercises    Shoulder Instructions       General Comments O2 stable despite audible wet breath sounds    Pertinent Vitals/ Pain       Pain Assessment: Faces Faces Pain Scale: Hurts a little bit Pain Location: chest, secondary to coughing Pain Descriptors / Indicators: Grimacing;Discomfort Pain Intervention(s): Monitored during session;Repositioned  Home Living                                          Prior Functioning/Environment              Frequency  Min 2X/week        Progress Toward Goals  OT Goals(current goals can now be found in the care plan section)  Progress towards OT goals: Progressing toward goals  Acute Rehab OT Goals Patient Stated Goal: to improve mobility and activity tolerance OT Goal Formulation: With patient Time For Goal Achievement: 12/09/19 Potential to Achieve Goals: Good  Plan Discharge plan remains appropriate    Co-evaluation      Reason for Co-Treatment: For patient/therapist safety;To address functional/ADL transfers          AM-PAC OT "6 Clicks" Daily Activity     Outcome Measure   Help from another person eating meals?: A Little Help from another person taking care of personal grooming?: A Little Help from another person  toileting, which includes using toliet, bedpan, or urinal?: Total Help from another person bathing (including washing, rinsing, drying)?: A Lot Help from another person to put on and taking off regular upper body clothing?: A Lot Help from another person to put on and taking off regular lower body clothing?: A Lot 6 Click Score: 13    End of Session Equipment Utilized During Treatment: Oxygen  OT Visit Diagnosis: Muscle weakness (generalized) (M62.81);Other abnormalities of gait and mobility (R26.89);Hemiplegia and hemiparesis Hemiplegia - Right/Left: Left Hemiplegia - dominant/non-dominant: Non-Dominant   Activity Tolerance Patient limited by fatigue   Patient Left in chair;with call bell/phone within reach   Nurse Communication Mobility status        Time: 6967-8938 OT Time Calculation (min): 26 min  Charges: OT General Charges $OT Visit: 1 Visit OT Treatments $Self Care/Home Management : 8-22 mins  Zenovia Jarred, MSOT, OTR/L [facility] Office Number: [phone] Pager: [phone]  Zenovia Jarred 11/27/2019, 5:15 PM

## 2019-11-27 NOTE — Progress Notes (Signed)
Progress Note    MATTHEO SWINDLE  PYP:[id number] DOB: 02-Sep-1950  DOA: 11/21/2019 PCP: Patient, No Pcp Per    Brief Narrative:    Medical records reviewed and are as summarized below:  Kamel L Dimaio is an 69 y.o. 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  Found to have a CVA: needs carotid stents.  Has other issues that will need outpatient follow up: hematuria and dysphagia/wet phonation.     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   CT head showed a small area of low density in the left parietal lobe compatible with acute to subacute infarction.  MRI of the brain confirmed acute ischemic infarct involving the left temporaloccipital region.  Stroke work-up initiated.  Neurology following. PT/OT/speech evaluation pending -  LDL of 154-statin -  Hemoglobin A1c 6.3 Echo : Left ventricular ejection fraction, by estimation, is 55 to 60%. The  left ventricle has normal function. The left ventricle has no regional  wall motion abnormalities. Left ventricular diastolic parameters are  consistent with Grade I diastolic dysfunction (impaired relaxation). Elevated left atrial pressure. CTA head and neck did not show any large  vessel occlusion but showed chronic Severe bilateral Common Carotid Artery stenosis in the lower neck, likely post radiation related. Bilateral radiographic string signs, stable on the left but progressed on the right since last CTA study.  - IR consulted for cerebral angiogram: Plan for image-guided cerebral arteriogram with possible revascularization (angioplasty/stent placement) of right common carotid artery stenosis next week in IR- will determine exact date/time based on anesthesia availability.  Continue taking Brilinta (lower dose started on 10/26) and Aspirin 81 mg once daily.  -procedure planned for 10/27 AM  Dysphagia/concern for aspiration/wet vocals -SLP eval -MBBS: DYS 1 diet -repeated x ray: possible aspiration PNA on right-- WBC trending up so added IV Abx 10/24 -? Need for ENT eval- discussed with Dr. Redmond Baseman: recommends outpatient follow up -trial of mucomyst to thin secretion -- on AM of 10/26 it appears that mucomyst was helping so will continue along with mobilization and flutter valve  History of lung cancer:  -Currently in remission.  Stage IIIa, treated with chemotherapy.  Follows with oncology, Dr. Julien Nordmann.  COPD: He was hospitalized recently and was discharged on 11/20/19 after management of COPD exacerbation, pneumonia.  He completed course of Levaquin and was discharged on tapering prednisone dose.  Continue bronchodilators.   -He continues to have wet rhonchi-- suspect upper airway radiating to lower lungs -PRN bipap -nebs -pulm toilet  Hypertension:  -BP stable off meds (early in stay was hypotensive)  History of smoking: Continue nicotine patch -encourage cessation  Hematuria (foley placed due to enlarged prostate) -2 recent ER visits for same -will order cytology and continue to monitor -outpatient urology follow up in  this smoker- Dr. Abner Greenspan -no clots seen- could need a larger foley and CBI if clots become an issue  TSH high -free t4  normal   Palliative care goals of care discussed with family.  Patient has many medical issues that may be hard to balance (ie need for stent and hematuria)  Family Communication/Anticipated D/C date and plan/Code Status   DVT prophylaxis: Lovenox ordered. Code Status: Full Code.  Family Communication: at bedside 10/25 Disposition Plan: Status is: Inpatient  Remains inpatient appropriate because:Inpatient level of care appropriate due to severity of illness   Dispo: The patient is from: Home              Anticipated d/c is to: tbd- SNF?              Anticipated d/c date is: > 3 days              Patient currently is not medically stable to d/c. stent placement on Wednesday         Medical Consultants:    Neurology  IR     Subjective:   Says he has been able to cough up some mucous  Objective:    Vitals:   11/27/19 0805 11/27/19 0826 11/27/19 1154 11/27/19 1424  BP: 128/74  (!) 127/91   Pulse: 96 (!) 110 97   Resp: 20 (!) 25 20   Temp: 98.1 F (36.7 C)  (!) 97.5 F (36.4 C)   TempSrc: Oral  Axillary   SpO2: 100% 100% 100% 99%  Weight:      Height:        Intake/Output Summary (Last 24 hours) at 11/27/2019 1449 Last data filed at 11/26/2019 2300 Gross per 24 hour  Intake --  Output 300 ml  Net -300 ml   Filed Weights   11/21/19 1819  Weight: 48.1 kg    Exam:  General: Appearance:    Frail male in no acute distress     Lungs:     Rhonchus breath sounds b/l that appear to be upper airway, respirations unlabored, not on O2 currently  Heart:    Normal heart rate. Normal rhythm. No murmurs, rubs, or gallops.   MS:  Right BK amputation noted.   Neurologic:   Awake, alert, oriented x 3. No apparent focal neurological           defect.      Data Reviewed:   I have personally reviewed following labs and imaging studies:  Labs: Labs show the following:   Basic Metabolic Panel: Recent Labs  Lab 11/21/19 1855 11/21/19 1905 11/21/19 2115  11/21/19 2115 11/22/19 0347 11/23/19 0225 11/23/19 0225 11/25/19 0450 11/25/19 0450 11/26/19 1021 11/27/19 0431  NA 131*   < > 138  --   --  135  --  134*  --  135 136  K 8.3*   < > 3.9   < >  --  3.7   < > 3.9   < > 3.6 3.5  CL 99   < > 103  --   --  100  --  100  --  100 102  CO2 21*  --   --   --   --  23  --  22  --  24 25  GLUCOSE 120*   < > 115*  --   --  125*  --  110*  --  112* 135*  BUN 18   < > 20  --   --  14  --  11  --  9 10  CREATININE 1.00   < > 0.60*   < > 0.52* 0.70  --  0.69  --  0.73 0.62  CALCIUM 8.2*  --   --   --   --  8.6*  --  8.7*  --  8.8* 8.4*   < > = values in this interval not displayed.   GFR Estimated Creatinine Clearance: 60.1 mL/min (by C-G formula based on SCr of 0.62 mg/dL). Liver Function Tests: Recent Labs  Lab 11/21/19 1855  AST 71*  ALT 94*  ALKPHOS 89  BILITOT <0.1*  PROT 6.3*  ALBUMIN 2.9*   No results for input(s): LIPASE, AMYLASE in the last 168 hours. No results for input(s): AMMONIA in the last 168 hours. Coagulation profile Recent Labs  Lab 11/21/19 2158  INR 1.2    CBC: Recent Labs  Lab 11/21/19 1855 11/21/19 1905 11/22/19 0347 11/23/19 0225 11/25/19 0450 11/26/19 1021 11/27/19 0431  WBC 11.8*   < > 10.2 11.9* 14.3* 14.0* 12.1*  NEUTROABS 10.4*  --   --  10.7*  --   --   --   HGB 12.7*   < > 10.8* 10.8* 11.7* 11.3* 10.1*  HCT 40.5   < > 32.7* 33.6* 36.0* 34.7* 31.3*  MCV 87.3   < > 84.5 83.2 82.6 84.0 83.2  PLT 421*   < > 397 425* 490* 508* 440*   < > = values in this interval not displayed.   Cardiac Enzymes: No results for input(s): CKTOTAL, CKMB, CKMBINDEX, TROPONINI in the last 168 hours. BNP (last 3 results) No results for input(s): PROBNP in the last 8760 hours. CBG: Recent Labs  Lab 11/21/19 1841 11/22/19 1052  GLUCAP 128* 92   D-Dimer: No results for input(s): DDIMER in the last 72 hours. Hgb A1c: No results for input(s): HGBA1C in the last 72 hours. Lipid Profile: No results for  input(s): CHOL, HDL, LDLCALC, TRIG, CHOLHDL, LDLDIRECT in the last 72 hours. Thyroid function studies: Recent Labs    11/26/19 1021  TSH 4.724*   Anemia work up: No results for input(s): VITAMINB12, FOLATE, FERRITIN, TIBC, IRON, RETICCTPCT in the last 72 hours. Sepsis Labs: Recent Labs  Lab 11/23/19 0225 11/25/19 0450 11/26/19 1021 11/27/19 0431  WBC 11.9* 14.3* 14.0* 12.1*    Microbiology Recent Results (from the past 240 hour(s))  Resp Panel by RT PCR (RSV, Flu A&B, Covid) - Nasopharyngeal Swab     Status: None   Collection Time: 11/21/19  7:08 PM   Specimen: Nasopharyngeal Swab  Result Value Ref Range Status   SARS Coronavirus 2 by RT PCR NEGATIVE NEGATIVE Final    Comment: (NOTE) SARS-CoV-2 target nucleic acids are NOT DETECTED.  The SARS-CoV-2 RNA is generally detectable in upper respiratoy specimens during the acute phase of infection. The lowest concentration of SARS-CoV-2 viral copies this assay can detect is 131 copies/mL. A negative result does not preclude SARS-Cov-2 infection and should not be used as the sole basis for treatment or other patient management decisions. A negative result may occur with  improper specimen collection/handling, submission of specimen other than nasopharyngeal swab, presence of viral mutation(s) within the areas targeted by this assay, and inadequate number of viral copies (<131 copies/mL). A negative result must be combined with clinical observations, patient history, and epidemiological information. The expected result is Negative.  Fact Sheet for Patients:  PinkCheek.be  Fact Sheet for Healthcare Providers:  GravelBags.it  This test is  no t yet approved or cleared by the [place] and  has been authorized for detection and/or diagnosis of SARS-CoV-2 by FDA under an Emergency Use Authorization (EUA). This EUA will remain  in effect (meaning this test can be  used) for the duration of the COVID-19 declaration under Section 564(b)(1) of the Act, 21 U.S.C. section 360bbb-3(b)(1), unless the authorization is terminated or revoked sooner.     Influenza A by PCR NEGATIVE NEGATIVE Final   Influenza B by PCR NEGATIVE NEGATIVE Final    Comment: (NOTE) The Xpert Xpress SARS-CoV-2/FLU/RSV assay is intended as an aid in  the diagnosis of influenza from Nasopharyngeal swab specimens and  should not be used as a sole basis for treatment. Nasal washings and  aspirates are unacceptable for Xpert Xpress SARS-CoV-2/FLU/RSV  testing.  Fact Sheet for Patients: PinkCheek.be  Fact Sheet for Healthcare Providers: GravelBags.it  This test is not yet approved or cleared by the [place] FDA and  has been authorized for detection and/or diagnosis of SARS-CoV-2 by  FDA under an Emergency Use Authorization (EUA). This EUA will remain  in effect (meaning this test can be used) for the duration of the  Covid-19 declaration under Section 564(b)(1) of the Act, 21  U.S.C. section 360bbb-3(b)(1), unless the authorization is  terminated or revoked.    Respiratory Syncytial Virus by PCR NEGATIVE NEGATIVE Final    Comment: (NOTE) Fact Sheet for Patients: PinkCheek.be  Fact Sheet for Healthcare Providers: GravelBags.it  This test is not yet approved or cleared by the [place] FDA and  has been authorized for detection and/or diagnosis of SARS-CoV-2 by  FDA under an Emergency Use Authorization (EUA). This EUA will remain  in effect (meaning this test can be used) for the duration of the  COVID-19 declaration under Section 564(b)(1) of the Act, 21 U.S.C.  section 360bbb-3(b)(1), unless the authorization is terminated or  revoked. Performed at [facility], [place] [address], [place], [place] [id number]     Procedures and  diagnostic studies:  No results found.  Medications:   .  stroke: mapping our early stages of recovery book   Does not apply Once  . acetylcysteine  4 mL Nebulization BID  . aspirin EC  81 mg Oral Daily  . atorvastatin  80 mg Oral Daily  . budesonide  0.5 mg Nebulization BID  . Chlorhexidine Gluconate Cloth  6 each Topical Daily  . enoxaparin (LOVENOX) injection  40 mg Subcutaneous Q24H  . feeding supplement  237 mL Oral TID BM  . guaiFENesin  600 mg Oral BID  . multivitamin with minerals  1 tablet Oral Daily  . nicotine  14 mg Transdermal Daily  . pantoprazole  40 mg Oral BID  . tamsulosin  0.4 mg Oral QHS  . ticagrelor  45 mg Oral BID   Continuous Infusions: . ampicillin-sulbactam (UNASYN) IV 3 g (11/27/19 0949)     LOS: 6 days   Geradine Girt  [facility]   How to contact the [facility] Attending or Consulting provider [place] or covering provider during after hours [place], for this patient?  1. Check the care team in [facility] and look for a) attending/consulting TRH provider listed and b) the [facility] team listed 2. Log into [email or web address] and use [place]'s universal password to access. If you do not have the password, please contact the hospital operator. 3. Locate the [facility] provider you are looking for under [facility] and page to  a number that you can be directly reached. 4. If you still have difficulty reaching the provider, please page the [facility] (Director on Call) for the Hospitalists listed on amion for assistance.  11/27/2019, 2:49 PM

## 2019-11-27 NOTE — TOC Progression Note (Signed)
Transition of Care [facility]) - Progression Note    Patient Details  Name: RASHEE MARSCHALL MRN: [id number] Date of Birth: 1951-01-14  Transition of [facility]) CM/SW [place], [place] Phone Number: 11/27/2019, 3:33 PM  Clinical Narrative:   CSW met with patient and son at bedside to discuss SNF offers. Son said he and his sister are still reviewing, but looking at [place], [place], or [facility]. Patient has been to [facility] before and they liked it, but they want to make sure there isn't somewhere else that they would like better over [place]. CSW to check back in with them tomorrow on choice. CSW to fax request to [facility] once therapy updates are in the chart. CSW to follow.    Expected Discharge Plan: Skilled Nursing Facility Barriers to Discharge: Ship broker, Continued Medical Work up  Expected Discharge Plan and Services Expected Discharge Plan: [place]                                               Social Determinants of Health (SDOH) Interventions    Readmission Risk Interventions No flowsheet data found.

## 2019-11-27 NOTE — Progress Notes (Signed)
STROKE TEAM PROGRESS NOTE   INTERVAL HISTORY His son is at the bedside. Patient states he is doing well.  He still has gurgling sounds in his throat and is on nasal cannula oxygen.  Neurological exam is unchanged with persistent left hand weakness.  He continues to have hematuria and P2 Y 12 significantly reduced to 6 hence Brilinta dose has been reduced to 75 mg twice daily and repeat labs tomorrow morning prior to making decision about angioplasty stenting by radiology..  Hematocrit has come down to 31.3 this morning  Vitals:   11/27/19 0805 11/27/19 0826 11/27/19 1154 11/27/19 1424  BP: 128/74  (!) 127/91   Pulse: 96 (!) 110 97   Resp: 20 (!) 25 20   Temp: 98.1 F (36.7 C)  (!) 97.5 F (36.4 C)   TempSrc: Oral  Axillary   SpO2: 100% 100% 100% 99%  Weight:      Height:       CBC:  Recent Labs  Lab 11/21/19 1855 11/21/19 1905 11/23/19 0225 11/25/19 0450 11/26/19 1021 11/27/19 0431  WBC 11.8*   < > 11.9*   < > 14.0* 12.1*  NEUTROABS 10.4*  --  10.7*  --   --   --   HGB 12.7*   < > 10.8*   < > 11.3* 10.1*  HCT 40.5   < > 33.6*   < > 34.7* 31.3*  MCV 87.3   < > 83.2   < > 84.0 83.2  PLT 421*   < > 425*   < > 508* 440*   < > = values in this interval not displayed.   Basic Metabolic Panel:  Recent Labs  Lab 11/26/19 1021 11/27/19 0431  NA 135 136  K 3.6 3.5  CL 100 102  CO2 24 25  GLUCOSE 112* 135*  BUN 9 10  CREATININE 0.73 0.62  CALCIUM 8.8* 8.4*    IMAGING past 24 hours No results found.  PHYSICAL EXAM stable     Temp:  [97.5 F (36.4 C)-99.6 F (37.6 C)] 97.5 F (36.4 C) (10/26 1154) Pulse Rate:  [96-110] 97 (10/26 1154) Resp:  [20-25] 20 (10/26 1154) BP: (109-135)/(73-98) 127/91 (10/26 1154) SpO2:  [98 %-100 %] 99 % (10/26 1424)  General -frail cachectic malnourished looking elderly male in no apparent distress. He has right below-knee amputation.  Ophthalmologic - fundi not visualized due to noncooperation.  Cardiovascular - Regular rhythm and  rate.  Respiratory - b/l Rhonchi, secretion in throat with weak cough.  Neuro stable - awake alert, oriented x3.  No aphasia, hypophonic speech with mild dysarthria weak cough, able to name and repeat.  Follows commands.  No gaze preference.  Mild left facial droop, tongue midline.  Left upper extremity 3+/5 deltoid, 3/5 bicep, 3/5 tricep and finger grip. Significant weakness of left hand intrinsic hand muscles. Right upper extremity at least 4/5.  Bilateral lower extremity at least 4/5 with right BKA.  Sensation symmetrical.  Finger-to-nose intact on the right.  Gait not tested.   ASSESSMENT/PLAN Mr. CORBEN AUZENNE is a 69 y.o. male with history of recurrent non small cell lung cancer (stage IIIa status post chemo and radiation), traumatic right knee BKA, recent recurrent pneumonia, left renal mass, known prior bilateral carotid artery stenosis, COPD w/ recent exacerbation d/c 10/19. He has had generalized weakness and poor sleep, chronic swallowing, wt loss with poor apptetite and his son and dtr recently moved in to help him. Plans for prostate bx for which  he had stopped his aspirin. On 10/20 presented to [facility] ED with difficulty w/ spatial awareness and vision guided movements, HA, L sided weakness. Transfered to [employer]. [facility].  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 Additional scattered cortical + subcortical B watershed infarcts. Min petechial hemorrhage in L temporooccipital infarct. Old tiny L cerebellar infarct  CTA head & neck no LVO. 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 suspicion for L IJ thrombosis. B ICA siphon w/ mild stenosis. LLL opacity.   MRA head  Unremarkable   Cerebral angio confirmed bilateral CCA high-grade stenosis  2D Echo EF 55 to 60%  LDL 154   HgbA1c  6.3  VTE prophylaxis - Lovenox 40 mg sq daily   off aspirin for planned prostate bx prior to admission, now on aspirin 81 mg daily and brilinta 90 bid.   Therapy recommendations:  Skilled Nursing Facility   Disposition:  pending   Respiratory Distress / Agitation  Increased WOB 10/21 evening  Put on BIPAP w/ relief  Changed level to progressive care   Treated w/ ativan and haldol. Good relief w/ haldol  Carotid Stenosis   CTA neck 02/2017 Long segment of severe 80% right and severe 90% left proximal  common carotid artery stenosis.  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  Cerebral angio confirmed bilateral CCA high-grade stenosis  Plan for right CCA - stenting planned for 10/27 Wednesday   Now on ASA and brilinta half dose  L parietooccipital lesion  MRI w/ severely degraded, enhancement L temporooccipital lobe, repeat recommended  Likely due to infarction  MRI with contrast nondiagnostic - motion due to SOB lying flat L IJ thrombosis  No prior line placement in neck per family, history of for Port-A-Cath on right chest per patient  CTA neck in 2019 also suspicious for left IJ thrombosis, however not for certain - discussed with Dr. 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 Avoid low BP . No apparent CHF history . Stopped Flomax to see if pressure improves. . On IVF  . Long-term BP goal 130-150  given carotid stenosis.   Hyperlipidemia  Home meds:  No statin  Now on lipitor 80  LDL 154, goal < 70  Continue statin at discharge  Non Small cell lung CA  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   recurrence on the left side presented as a stage IIIa non-small cell lung cancer, squamous cell carcinoma status post concurrent chemoradiation followed by consolidation chemotherapy. Completed in March 2017.  He also underwent SBRT to the right middle lobe lung nodule under the care of Dr. [name] completed  on 07/24/2018.  He also underwent SBRT to left lower lobe lung nodule under the care of Dr. [name] completed on April 27, 2019.  Recent scan showed no concerning findings for disease progression  Followed with Dr. Julien Nordmann  Other Stroke Risk Factors  Advanced age  Cigarette smoker, quit 7 mos ago, advised to stop smoking  Other Active Problems  Recent COPD exacerbation, d/c 10/19  Recent recurrent PNA. CXR - slight increase in R basilar opacity possible atx vs infiltrate  Hematuria with urinary retention. Foley. Flomax. This may be a concern on DAPT post stent placement,  Marked enlargement of the Prostate w/ plans for bx    Cachexia, Body mass index is 17.64 kg/m.   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 He may benefit with elective revascularization pending medical stability. Long discussion with patient, son  and answered questions.agree with reducing dose of Brilinta and follow hematocrit.  Greater than 50% time during this 25-minute visit was spent in counseling and coordination of care and discussion about his carotid stenosis and revascularization treatment  and answering questions. Antony Contras, MD Medical Director [place] Pager: [phone] 11/27/2019 2:42 PM  To contact Stroke Continuity provider, please refer to [email or web address] After hours, contact General Neurology

## 2019-11-27 NOTE — Progress Notes (Signed)
Physical Therapy Treatment Patient Details Name: Evan [name] MRN: [id number] DOB: 05-22-1950 Today's Date: 11/27/2019    History of Present Illness 69 y.o. male with medical history significant of lung cancer apparently stage IIIa status post chemo now in remission, recent hospitalization with recurrent pneumonia, COPD, depression, left renal mass, anxiety disorder, and traumatic R BKA, presents with confusion, weakness, and mild aphasia. CT and MRI demonstrate new left parieto-occipital infarct. On 11/22/2019 pt developing agitation and respiratory distress requiring BiPAP and haldol. Pt underwent bilateral cerebral arteriogram on 11/23/2019 demonstrating severe bilateral common carotid artery stenosis at proximal/mid levels.    PT Comments    Pt fatigued upon arrival to room, per RN pt was deep suctioned x2 last night and has been fatigued all day. Pt agreeable to OOB mobility with OT and PT encouragement. Pt requiring max +2 assist for bed mobility and transfer to recliner today, pt limited by respiratory status and fatigue. PT to continue to progress mobility as tolerated.      Follow Up Recommendations  SNF;Supervision/Assistance - 24 hour     Equipment Recommendations  Other (comment) (mechanical lift if home today)    Recommendations for Other Services       Precautions / Restrictions Precautions Precautions: Fall Precaution Comments: Rt BKA (prosthesis in room) Restrictions Weight Bearing Restrictions: No    Mobility  Bed Mobility Overal bed mobility: Needs Assistance Bed Mobility: Supine to Sit     Supine to sit: Max assist;+2 for physical assistance     General bed mobility comments: max +2 assist for trunk and LE management, scooting to EOB, and righting posture to midline. Preference for anterior leaning when PT/OT requests pt right posture.  Transfers Overall transfer level: Needs assistance Equipment used: 2 person hand held assist Transfers: Sit  to/from [facility] Sit to Stand: Max assist;+2 physical assistance Stand pivot transfers: Max assist;+2 physical assistance       General transfer comment: Max +2 for power up, hip extension, LLE blocking, and safe transfer towards L to drop arm recliner. Assist for boost back in recliner.  Ambulation/Gait             General Gait Details: NT   Stairs             Wheelchair Mobility    Modified Rankin (Stroke Patients Only) Modified Rankin (Stroke Patients Only) Pre-Morbid Rankin Score: Slight disability Modified Rankin: Severe disability     Balance Overall balance assessment: Needs assistance Sitting-balance support: Single extremity supported;Feet supported Sitting balance-Leahy Scale: Poor Sitting balance - Comments: LUE placed in WB positioning for balance and proprioceptive input, R lateral and anterior truncal leaning requiring at least min assist to maintain upright   Standing balance support: During functional activity Standing balance-Leahy Scale: Zero Standing balance comment: max +2                            Cognition Arousal/Alertness: Awake/alert Behavior During Therapy: WFL for tasks assessed/performed;Flat affect Overall Cognitive Status: Impaired/Different from baseline Area of Impairment: Attention;Following commands;Safety/judgement;Awareness;Problem solving                   Current Attention Level: Sustained   Following Commands: Follows one step commands with increased time Safety/Judgement: Decreased awareness of safety;Decreased awareness of deficits Awareness: Intellectual Problem Solving: Slow processing;Difficulty sequencing;Requires verbal cues;Requires tactile cues General Comments: Drowsy upon arrival to room, limited attention secondary to respiratory difficulty (coughing, uncleared secretions)  Exercises      General Comments General comments (skin integrity, edema, etc.):  secretion-laden breath sounds, unable to clear secretions with coughing or chest PT. HRmax observed - 119 bpm      Pertinent Vitals/Pain Pain Assessment: Faces Faces Pain Scale: Hurts little more Pain Location: chest, secondary to coughing Pain Descriptors / Indicators: Grimacing;Discomfort Pain Intervention(s): Limited activity within patient's tolerance;Monitored during session;Repositioned    Home Living                      Prior Function            PT Goals (current goals can now be found in the care plan section) Acute Rehab PT Goals Patient Stated Goal: to improve mobility and activity tolerance PT Goal Formulation: With patient/family Time For Goal Achievement: 12/08/19 Potential to Achieve Goals: Fair Progress towards PT goals: Progressing toward goals    Frequency    Min 3X/week      PT Plan Current plan remains appropriate    Co-evaluation PT/OT/SLP Co-Evaluation/Treatment: Yes Reason for Co-Treatment: For patient/therapist safety;To address functional/ADL transfers          AM-PAC PT "6 Clicks" Mobility   Outcome Measure  Help needed turning from your back to your side while in a flat bed without using bedrails?: A Lot Help needed moving from lying on your back to sitting on the side of a flat bed without using bedrails?: Total Help needed moving to and from a bed to a chair (including a wheelchair)?: Total Help needed standing up from a chair using your arms ([name].g., wheelchair or bedside chair)?: Total Help needed to walk in hospital room?: Total Help needed climbing 3-5 steps with a railing? : Total 6 Click Score: 7    End of Session Equipment Utilized During Treatment: Oxygen;Gait belt Activity Tolerance: Patient limited by fatigue Patient left: with call bell/phone within reach;in chair;with chair alarm set;with family/visitor present Nurse Communication: Mobility status PT Visit Diagnosis: Other abnormalities of gait and mobility  (R26.89);Unsteadiness on feet (R26.81);Muscle weakness (generalized) (M62.81);Other symptoms and signs involving the nervous system (R29.898);Hemiplegia and hemiparesis Hemiplegia - Right/Left: Left Hemiplegia - dominant/non-dominant: Non-dominant Hemiplegia - caused by: Cerebral infarction     Time: 7517-0017 PT Time Calculation (min) (ACUTE ONLY): 24 min  Charges:  $Therapeutic Activity: 8-22 mins                     Evan [name], PT Acute Rehabilitation Services Pager [phone]  Office [phone]     Evan [name] Evan [name] 11/27/2019, 4:47 PM

## 2019-11-27 NOTE — Care Management Important Message (Signed)
Important Message  Patient Details  Name: Evan [name] MRN: [id number] Date of Birth: 1951/01/18   Medicare Important Message Given:  Yes     Orbie Pyo 11/27/2019, 2:24 PM

## 2019-11-27 NOTE — Progress Notes (Signed)
Attempted vest CPT with pt. One minute into therapy, pt requested that I stopped the vest due to his "heart hurting him". Rn at bedside to witness. Pt is extremely rhochi throughout lungs and has a weak cough. Pt was informed that if he isn't able to cough out his own secretions that I would have to NTS him which he has made clear that he doesn't want. RT will monitor throughout the day.

## 2019-11-27 NOTE — Progress Notes (Signed)
     Referral received for Evan [name] :goals of care discussion. Chart reviewed and updates received from RN. Patient assessed at bedside. Son Evan [name] is at the bedside.   I introduced myself and the role of Palliative. Both patient and son verbalized understanding.   Son currently working remotely and have to get on an important call. He hs requested a later visit or phone conference. Also would like his sister to be able to be involved via phone as well. Evan [name] is aware I will contact family at a later time.   Detailed note and recommendations to follow completed goals of care.    Thank you for your referral and allowing PMT to assist in Evan [name] [facility] care.   Alda Lea, AGPCNP-BC Palliative Medicine Team  Phone: [phone] Pager: [phone] Amion: N. [place]   NO CHARGE

## 2019-11-27 NOTE — Progress Notes (Signed)
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 Estanislado Pandy revealing bilateral common carotid artery stenosis- thought to be cause of recent CVA (left-sided weakness- symptomatic from right CCA stenosis). Patient awake Evan alert sitting in bed watching TV with no complaints at this [name]. Discussed results of P2Y12 Evan medication changes based on this result.   Allergies: Chantix [varenicline]  Medications: Prior to Admission medications   Medication Sig Start Date End Date Taking? Authorizing Provider  acetaminophen (TYLENOL) 500 MG tablet Take 500 mg by mouth every 6 (six) hours as needed for mild pain, fever or headache.    Yes [provider]  albuterol (VENTOLIN HFA) 108 (90 Base) MCG/ACT inhaler TAKE 2 PUFFS BY MOUTH EVERY 6 HOURS AS NEEDED FOR WHEEZE OR SHORTNESS OF BREATH Patient taking differently: Inhale 2 puffs into the lungs every 6 (six) hours as needed for wheezing or shortness of breath.  08/30/19  Yes Rigoberto Noel, MD  benzonatate (TESSALON) 100 MG capsule Take 1 capsule (100 mg total) by mouth every 8 (eight) hours. 11/09/19  Yes Dorie Rank, MD  Glycopyrrolate-Formoterol (BEVESPI AEROSPHERE) 9-4.8 MCG/ACT AERO Inhale 2 puffs into the lungs 2 (two) times daily. 06/26/19  Yes Rigoberto Noel, MD  guaiFENesin (MUCINEX) 600 MG 12 hr tablet Take 600 mg by mouth 2 (two) times daily as needed for cough.   Yes [provider]  guaiFENesin (ROBITUSSIN) 100 MG/5ML liquid Take 200 mg by mouth 3 (three) times daily as needed for cough.   Yes [provider]  nicotine (NICODERM CQ - DOSED IN MG/24 HOURS) 14 mg/24hr patch Evan [name] 1 patch (14 mg total) onto the skin daily. For 6 weeks.  Then decrease to 7 mg daily.  07/25/19  Yes Yopp, Amber C, RPH-CPP  tamsulosin (FLOMAX) 0.4 MG [name] capsule Take 0.4 mg by mouth at bedtime. 10/10/19  Yes [provider]  budesonide (PULMICORT) 0.5 MG/2ML nebulizer solution Take 2 mLs (0.5 mg total) by nebulization 2 (two) times daily. 11/21/19   Martyn Ehrich, NP  [employer] ASPIRIN EC 325 MG EC tablet TAKE 1 TABLET BY MOUTH EVERY DAY Patient taking differently: Take 325 mg by mouth daily.  08/16/19   Vaslow, Acey Lav, MD  ipratropium-albuterol (DUONEB) 0.5-2.5 (3) MG/3ML SOLN Take 3 mLs by nebulization every 6 (six) hours as needed. Patient taking differently: Take 3 mLs by nebulization every 6 (six) hours as needed (sob).  11/21/19   Martyn Ehrich, NP  nicotine polacrilex (NICORETTE) 4 MG gum Take 1 each (4 mg total) by mouth as needed for smoking cessation. Patient not taking: Reported on 11/15/2019 07/25/19   Yopp, [employer] C, RPH-CPP     Vital Signs: BP 128/74 (BP Location: Left Arm)   Pulse (!) 110   Temp 98.1 F (36.7 C) (Oral)   Resp (!) 25   Ht 5\' 5"  (1.651 m)   Wt 106 lb (48.1 kg)   SpO2 100%   BMI 17.64 kg/m   Physical Exam Vitals Evan nursing note reviewed.  Constitutional:      General: Evan [name]. Pulmonary:     Effort: Pulmonary effort is normal. No respiratory [name].  Musculoskeletal:     Comments: (+) right BKA.   Skin:    General: Skin is warm Evan dry.  Neurological:     Mental Status: Evan [name], Evan [name], Evan [name].     Comments: Can spontaneously move all extremities.     Imaging: IR US Guide Vasc Access Right  Result Date: 11/26/2019 CLINICAL DATA:  Left-sided weakness arm greater than leg. CT angiogram of the head Evan neck demonstrating high-grade stenosis of the common carotid arteries bilaterally. EXAM: BILATERAL COMMON CAROTID Evan INNOMINATE ANGIOGRAPHY COMPARISON:  CT angiogram of the head Evan neck of November 30, 2019. MEDICATIONS: Heparin 2000 units IA. No antibiotic was  administered within 1 hour of the procedure. ANESTHESIA/SEDATION: Versed 0.5 mg IV; Fentanyl 12.5 mcg IV Moderate Sedation [name]:  35 minutes The patient was continuously monitored during the procedure by the interventional radiology nurse under my direct supervision. CONTRAST:  Omnipaque 300 approximately 65 mL FLUOROSCOPY [name]:  Fluoroscopy [name]: 10 minutes 36 seconds (530 mGy). COMPLICATIONS: None immediate. TECHNIQUE: Informed written consent was obtained from the patient after a thorough discussion of the procedural risks, benefits Evan alternatives. All questions were addressed. Evan [name], Evan [name], Evan [name], Evan [name], Evan [name], Evan hygiene Evan skin antiseptic. A timeout was performed prior to the initiation of the procedure. The right forearm to the wrist was prepped Evan draped in the usual Evan manner. The right radial artery was then identified with ultrasound, Evan its morphology documented. A dorsal palmar anastomosis was verified to be present. Using ultrasound guidance, radial access was obtained using a micropuncture set over a 0.018 inch micro guidewire. A 4/5 French radial sheath was then inserted. The micro guidewire, Evan the obturator were removed. Good aspiration obtained from the side port of the radial sheath. A cocktail of 1000 units of heparin, Evan 200 mcg of nitroglycerin was then infused through the radial sheath without event. A radial arteriogram was performed. Over a 0.035 inch Roadrunner guidewire, a Simmons 2 diagnostic catheter was then advanced to the aortic arch region, Evan selectively positioned in the right common carotid artery, the left common carotid artery, the left vertebral artery Evan the right subclavian artery. Following the procedure, hemostasis at the right radial puncture site was achieved with a wrist band. Distal right radial pulse was verified to be present. FINDINGS: The right common carotid arteriogram  demonstrates segmental high-grade stenosis of 90% of the right common carotid artery at the junction of the middle Evan proximal 1/3 is seen. Distal to this opacification of the right common carotid artery Evan the right internal carotid artery is seen. There is a mild stenosis at the origin of the right internal carotid artery. More distally the petrous, the cavernous Evan the supraclinoid segments are widely patent. The right middle cerebral artery Evan the right anterior cerebral artery opacify into the capillary Evan venous phases. Cross-filling via the anterior communicating artery of the left anterior cerebral A2 segment Evan distally is seen. No opacification of the right external carotid artery at its origin is seen. The right subclavian arteriogram demonstrates hypoplastic right vertebral artery with flow seen to the cranial skull base. Retrograde opacification of the right external carotid artery territory is seen via the ipsilateral right occipital artery. The left common carotid arteriogram demonstrates severe segmental stenosis of 90% plus at the junction of the middle Evan proximal third of the left common carotid artery. More distally post stenotic dilatation is seen. Patency is seen of the left internal carotid artery at the bulb to the cranial skull base. The petrous, the cavernous Evan the supraclinoid  segments are widely patent. The left middle cerebral artery Evan the left anterior cerebral artery opacify into the capillary Evan venous phases. Non opacification of the left external carotid artery is seen. The dominant left vertebral artery origin is widely patent. The vessel opacifies to the cranial skull base. Patency is seen of the left vertebrobasilar junction, Evan the left posterior-inferior cerebellar artery. There is retrograde opacification of the left external carotid artery distribution via multiple musculoskeletal collaterals arising from the distal left vertebral artery. More distally the left  vertebrobasilar junction, the basilar artery, the posterior cerebral arteries, the superior cerebellar arteries Evan the anterior-inferior cerebellar arteries is seen into the capillary Evan venous phases. Retrograde opacification of the right vertebrobasilar junction proximal to the right posterior-inferior cerebellar artery is seen. Partial retrograde opacification via the left posterior communicating artery of the left middle cerebral artery distribution is seen. IMPRESSION: Severe high-grade stenosis of the common carotid arteries bilaterally at the junction of the middle Evan proximal 1/3 is seen slightly worse on the left. 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 PLAN: Findings reviewed with the patient Evan referring neurologist. Given the symptomatic nature of the right common carotid artery stenosis, endovascular revascularization scheduled for early next week. Electronically Signed   By: Luanne Bras M.D.   On: 11/23/2019 16:25   DG CHEST PORT 1 VIEW  Result Date: 11/25/2019 CLINICAL DATA:  Pneumonia, history of lung cancer. EXAM: PORTABLE CHEST 1 VIEW COMPARISON:  November 22, 2019. FINDINGS: Stable cardiomediastinal silhouette. No pneumothorax or pleural effusion is noted. Left lung is clear. Slightly increased right basilar opacity is noted concerning for possible atelectasis or infiltrate. Stable bilateral hilar findings consistent with retraction or fibrosis. Bony thorax is unremarkable. IMPRESSION: Slightly increased right basilar opacity is noted concerning for possible atelectasis or infiltrate. Electronically Signed   By: Marijo Conception M.D.   On: 11/25/2019 12:24   DG Swallowing Func-Speech Pathology  Result Date: 11/25/2019 Objective Swallowing Evaluation: Type of Study: MBS-Modified Barium Swallow Study  Patient Details Name: RENNER SEBALD MRN: [id number] Date of Birth:  24-Jul-1950 Today's Date: 11/25/2019 [name]: SLP Start [name] (ACUTE ONLY): 1100 -SLP Stop [name] (ACUTE ONLY): 1130 SLP [name] Calculation (min) (ACUTE ONLY): 30 min Past Medical History: Past Medical History: Diagnosis Date . Anxiety  . Complication of anesthesia   " sometimes I wake up during surgery " . Depression  . Left renal mass 07/19/2016 . Lung cancer ([place]) dx'd 2010   NSCL CA right . Lung cancer ([place]) dx'd 2017  left Past Surgical History: Past Surgical History: Procedure Laterality Date . AMPUTATION Right 05/21/2012  Procedure: AMPUTATION BELOW KNEE;  Surgeon: Newt Minion, MD;  Location: [place];  Service: Orthopedics;  Laterality: Right; . AMPUTATION Right 06/21/2012  Procedure: Revision AMPUTATION BELOW KNEE Right;  Surgeon: Newt Minion, MD;  Location: [place];  Service: Orthopedics;  Laterality: Right;  Revision right Below Knee Amputation . COLONOSCOPY W/ BIOPSIES Evan POLYPECTOMY   . FOOT AMPUTATION Right   traumatic right lower extremity . UPPER JAW    SURGERY FOR INFECTION HPI: Evan [name] is a 69 y.o. 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 Found to  have acute left parieto-occipital infarct. Now with worsening dysphagia over past 24-48 hours.  Subjective: alert but not very interactive Assessment / Plan / Recommendation CHL IP CLINICAL IMPRESSIONS 11/25/2019 Clinical Impression Patient presents with a moderate oropharyngeal dysphagia without aspiration but with incident of very trace penetration during swallow with thin liquids. Oral phase is characterized by weak oral control Evan movement of boluses, mastication was decreased Evan resulted in prolonged oral phase with regular solids. Evan  exhibited premature spillage of thin Evan nectar liquids, delay in swallow initiation to level of vallecular sinus with puree solids Evan pyriform sinus with thin liquids. Inital swallows of puree solids, regular solids resulted in moderate vallecular residuals Evan mild pharyngeal residuals. Patient independently initiated swallow which helped clear residuals Evan sips of thin liquids helped clear residuals in pharynx to trace to minimal in amount. Overall, patient's airway is well protected during swallow with all tested consistencies Evan Evan is safe to initiate oral diet at at this [name]. SLP Visit Diagnosis Dysphagia, oropharyngeal phase (R13.12) Attention Evan concentration deficit following -- Frontal lobe Evan executive function deficit following -- Impact on safety Evan function Mild aspiration risk   CHL IP TREATMENT RECOMMENDATION 11/25/2019 Treatment Recommendations Therapy as outlined in treatment plan below   Prognosis 11/25/2019 Prognosis for Safe Diet Advancement Good Barriers to Reach Goals -- Barriers/Prognosis Comment -- CHL IP DIET RECOMMENDATION 11/25/2019 SLP Diet Recommendations Thin liquid;Dysphagia 1 (Puree) solids Liquid Administration via Cup;Straw Medication Administration Whole meds with puree Compensations Minimize environmental distractions;Slow rate;Small sips/bites;Follow solids with liquid Postural Changes Remain semi-upright after after feeds/meals (Comment);Seated upright at 90 degrees   CHL IP OTHER RECOMMENDATIONS 11/25/2019 Recommended Consults -- Oral Care Recommendations Oral care BID Other Recommendations --   CHL IP FOLLOW UP RECOMMENDATIONS 11/25/2019 Follow up Recommendations 24 hour supervision/assistance;Skilled Nursing facility;Home health SLP   CHL IP FREQUENCY Evan DURATION 11/25/2019 Speech Therapy Frequency (ACUTE ONLY) min 2x/week Treatment Duration --      CHL IP ORAL PHASE 11/25/2019 Oral Phase Impaired Oral - Pudding Teaspoon -- Oral - Pudding Cup -- Oral - Honey Teaspoon  -- Oral - Honey Cup -- Oral - Nectar Teaspoon -- Oral - Nectar Cup Weak lingual manipulation;Premature spillage Oral - Nectar Straw -- Oral - Thin Teaspoon -- Oral - Thin Cup Premature spillage Oral - Thin Straw Premature spillage Oral - Puree Premature spillage Oral - Mech Soft -- Oral - Regular Delayed oral transit;Weak lingual manipulation Oral - Multi-Consistency -- Oral - Pill Impaired mastication;Weak lingual manipulation;Delayed oral transit Oral Phase - Comment --  CHL IP PHARYNGEAL PHASE 11/25/2019 Pharyngeal Phase Impaired Pharyngeal- Pudding Teaspoon -- Pharyngeal -- Pharyngeal- Pudding Cup -- Pharyngeal -- Pharyngeal- Honey Teaspoon -- Pharyngeal -- Pharyngeal- Honey Cup -- Pharyngeal -- Pharyngeal- Nectar Teaspoon -- Pharyngeal -- Pharyngeal- Nectar Cup Delayed swallow initiation-vallecula;Pharyngeal residue - valleculae;Pharyngeal residue - pyriform Pharyngeal -- Pharyngeal- Nectar Straw -- Pharyngeal -- Pharyngeal- Thin Teaspoon -- Pharyngeal -- Pharyngeal- Thin Cup Delayed swallow initiation-vallecula;Delayed swallow initiation-pyriform sinuses;Penetration/Aspiration during swallow;Pharyngeal residue - valleculae;Pharyngeal residue - pyriform Pharyngeal Material enters airway, remains ABOVE vocal cords then ejected out Pharyngeal- Thin Straw Delayed swallow initiation-pyriform sinuses;Delayed swallow initiation-vallecula Pharyngeal -- Pharyngeal- Puree Delayed swallow initiation-vallecula;Pharyngeal residue - valleculae Pharyngeal -- Pharyngeal- Mechanical Soft -- Pharyngeal -- Pharyngeal- Regular Pharyngeal residue - valleculae Pharyngeal -- Pharyngeal- Multi-consistency -- Pharyngeal -- Pharyngeal- Pill WFL Pharyngeal -- Pharyngeal Comment --  CHL IP CERVICAL ESOPHAGEAL PHASE 11/25/2019 Cervical Esophageal Phase WFL Pudding Teaspoon -- Pudding Cup -- Honey Teaspoon -- Honey Cup --  Nectar Teaspoon -- Nectar Cup -- Nectar Straw -- Thin Teaspoon -- Thin Cup -- Thin Straw -- Puree -- Mechanical Soft --  Regular -- Multi-consistency -- Pill -- Cervical Esophageal Comment -- Sonia Baller, MA, CCC-SLP Speech Therapy [facility]             IR ANGIO INTRA EXTRACRAN SEL COM CAROTID INNOMINATE BILAT MOD SED  Result Date: 11/26/2019 CLINICAL DATA:  Left-sided weakness arm greater than leg. CT angiogram of the head Evan neck demonstrating high-grade stenosis of the common carotid arteries bilaterally. EXAM: BILATERAL COMMON CAROTID Evan INNOMINATE ANGIOGRAPHY COMPARISON:  CT angiogram of the head Evan neck of November 30, 2019. MEDICATIONS: Heparin 2000 units IA. No antibiotic was administered within 1 hour of the procedure. ANESTHESIA/SEDATION: Versed 0.5 mg IV; Fentanyl 12.5 mcg IV Moderate Sedation [name]:  35 minutes The patient was continuously monitored during the procedure by the interventional radiology nurse under my direct supervision. CONTRAST:  Omnipaque 300 approximately 65 mL FLUOROSCOPY [name]:  Fluoroscopy [name]: 10 minutes 36 seconds (530 mGy). COMPLICATIONS: None immediate. TECHNIQUE: Informed written consent was obtained from the patient after a thorough discussion of the procedural risks, benefits Evan alternatives. All questions were addressed. Evan [name], Evan [name], Evan [name], Evan [name], Evan [name], Evan hygiene Evan skin antiseptic. A timeout was performed prior to the initiation of the procedure. The right forearm to the wrist was prepped Evan draped in the usual Evan manner. The right radial artery was then identified with ultrasound, Evan its morphology documented. A dorsal palmar anastomosis was verified to be present. Using ultrasound guidance, radial access was obtained using a micropuncture set over a 0.018 inch micro guidewire. A 4/5 French radial sheath was then inserted. The micro guidewire, Evan the obturator were removed. Good aspiration obtained from the side port of the radial sheath. A cocktail of 1000 units of heparin, Evan 200  mcg of nitroglycerin was then infused through the radial sheath without event. A radial arteriogram was performed. Over a 0.035 inch Roadrunner guidewire, a Simmons 2 diagnostic catheter was then advanced to the aortic arch region, Evan selectively positioned in the right common carotid artery, the left common carotid artery, the left vertebral artery Evan the right subclavian artery. Following the procedure, hemostasis at the right radial puncture site was achieved with a wrist band. Distal right radial pulse was verified to be present. FINDINGS: The right common carotid arteriogram demonstrates segmental high-grade stenosis of 90% of the right common carotid artery at the junction of the middle Evan proximal 1/3 is seen. Distal to this opacification of the right common carotid artery Evan the right internal carotid artery is seen. There is a mild stenosis at the origin of the right internal carotid artery. More distally the petrous, the cavernous Evan the supraclinoid segments are widely patent. The right middle cerebral artery Evan the right anterior cerebral artery opacify into the capillary Evan venous phases. Cross-filling via the anterior communicating artery of the left anterior cerebral A2 segment Evan distally is seen. No opacification of the right external carotid artery at its origin is seen. The right subclavian arteriogram demonstrates hypoplastic right vertebral artery with flow seen to the cranial skull base. Retrograde opacification of the right external carotid artery territory is seen via the ipsilateral right occipital artery. The left common carotid arteriogram demonstrates severe segmental stenosis of 90% plus at the junction of the middle Evan proximal third of the left common carotid artery. More distally post  stenotic dilatation is seen. Patency is seen of the left internal carotid artery at the bulb to the cranial skull base. The petrous, the cavernous Evan the supraclinoid segments are widely  patent. The left middle cerebral artery Evan the left anterior cerebral artery opacify into the capillary Evan venous phases. Non opacification of the left external carotid artery is seen. The dominant left vertebral artery origin is widely patent. The vessel opacifies to the cranial skull base. Patency is seen of the left vertebrobasilar junction, Evan the left posterior-inferior cerebellar artery. There is retrograde opacification of the left external carotid artery distribution via multiple musculoskeletal collaterals arising from the distal left vertebral artery. More distally the left vertebrobasilar junction, the basilar artery, the posterior cerebral arteries, the superior cerebellar arteries Evan the anterior-inferior cerebellar arteries is seen into the capillary Evan venous phases. Retrograde opacification of the right vertebrobasilar junction proximal to the right posterior-inferior cerebellar artery is seen. Partial retrograde opacification via the left posterior communicating artery of the left middle cerebral artery distribution is seen. IMPRESSION: Severe high-grade stenosis of the common carotid arteries bilaterally at the junction of the middle Evan proximal 1/3 is seen slightly worse on the left. 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 PLAN: Findings reviewed with the patient Evan referring neurologist. Given the symptomatic nature of the right common carotid artery stenosis, endovascular revascularization scheduled for early next week. Electronically Signed   By: Luanne Bras M.D.   On: 11/23/2019 16:25   IR ANGIO VERTEBRAL SEL VERTEBRAL UNI L MOD SED  Result Date: 11/26/2019 CLINICAL DATA:  Left-sided weakness arm greater than leg. CT angiogram of the head Evan neck demonstrating high-grade stenosis of the common carotid arteries bilaterally. EXAM: BILATERAL COMMON CAROTID Evan  INNOMINATE ANGIOGRAPHY COMPARISON:  CT angiogram of the head Evan neck of November 30, 2019. MEDICATIONS: Heparin 2000 units IA. No antibiotic was administered within 1 hour of the procedure. ANESTHESIA/SEDATION: Versed 0.5 mg IV; Fentanyl 12.5 mcg IV Moderate Sedation [name]:  35 minutes The patient was continuously monitored during the procedure by the interventional radiology nurse under my direct supervision. CONTRAST:  Omnipaque 300 approximately 65 mL FLUOROSCOPY [name]:  Fluoroscopy [name]: 10 minutes 36 seconds (530 mGy). COMPLICATIONS: None immediate. TECHNIQUE: Informed written consent was obtained from the patient after a thorough discussion of the procedural risks, benefits Evan alternatives. All questions were addressed. Evan [name], Evan [name], Evan [name], Evan [name], Evan [name], Evan hygiene Evan skin antiseptic. A timeout was performed prior to the initiation of the procedure. The right forearm to the wrist was prepped Evan draped in the usual Evan manner. The right radial artery was then identified with ultrasound, Evan its morphology documented. A dorsal palmar anastomosis was verified to be present. Using ultrasound guidance, radial access was obtained using a micropuncture set over a 0.018 inch micro guidewire. A 4/5 French radial sheath was then inserted. The micro guidewire, Evan the obturator were removed. Good aspiration obtained from the side port of the radial sheath. A cocktail of 1000 units of heparin, Evan 200 mcg of nitroglycerin was then infused through the radial sheath without event. A radial arteriogram was performed. Over a 0.035 inch Roadrunner guidewire, a Simmons 2 diagnostic catheter was then advanced to the aortic arch region, Evan selectively positioned in the right common carotid artery, the left common carotid artery, the left vertebral artery Evan the right subclavian artery. Following the  procedure, hemostasis at the right radial  puncture site was achieved with a wrist band. Distal right radial pulse was verified to be present. FINDINGS: The right common carotid arteriogram demonstrates segmental high-grade stenosis of 90% of the right common carotid artery at the junction of the middle Evan proximal 1/3 is seen. Distal to this opacification of the right common carotid artery Evan the right internal carotid artery is seen. There is a mild stenosis at the origin of the right internal carotid artery. More distally the petrous, the cavernous Evan the supraclinoid segments are widely patent. The right middle cerebral artery Evan the right anterior cerebral artery opacify into the capillary Evan venous phases. Cross-filling via the anterior communicating artery of the left anterior cerebral A2 segment Evan distally is seen. No opacification of the right external carotid artery at its origin is seen. The right subclavian arteriogram demonstrates hypoplastic right vertebral artery with flow seen to the cranial skull base. Retrograde opacification of the right external carotid artery territory is seen via the ipsilateral right occipital artery. The left common carotid arteriogram demonstrates severe segmental stenosis of 90% plus at the junction of the middle Evan proximal third of the left common carotid artery. More distally post stenotic dilatation is seen. Patency is seen of the left internal carotid artery at the bulb to the cranial skull base. The petrous, the cavernous Evan the supraclinoid segments are widely patent. The left middle cerebral artery Evan the left anterior cerebral artery opacify into the capillary Evan venous phases. Non opacification of the left external carotid artery is seen. The dominant left vertebral artery origin is widely patent. The vessel opacifies to the cranial skull base. Patency is seen of the left vertebrobasilar junction, Evan the left posterior-inferior cerebellar artery. There is retrograde opacification of the left  external carotid artery distribution via multiple musculoskeletal collaterals arising from the distal left vertebral artery. More distally the left vertebrobasilar junction, the basilar artery, the posterior cerebral arteries, the superior cerebellar arteries Evan the anterior-inferior cerebellar arteries is seen into the capillary Evan venous phases. Retrograde opacification of the right vertebrobasilar junction proximal to the right posterior-inferior cerebellar artery is seen. Partial retrograde opacification via the left posterior communicating artery of the left middle cerebral artery distribution is seen. IMPRESSION: Severe high-grade stenosis of the common carotid arteries bilaterally at the junction of the middle Evan proximal 1/3 is seen slightly worse on the left. 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 PLAN: Findings reviewed with the patient Evan referring neurologist. Given the symptomatic nature of the right common carotid artery stenosis, endovascular revascularization scheduled for early next week. Electronically Signed   By: Luanne Bras M.D.   On: 11/23/2019 16:25    Labs:  CBC: Recent Labs    11/23/19 0225 11/25/19 0450 11/26/19 1021 11/27/19 0431  WBC 11.9* 14.3* 14.0* 12.1*  HGB 10.8* 11.7* 11.3* 10.1*  HCT 33.6* 36.0* 34.7* 31.3*  PLT 425* 490* 508* 440*    COAGS: Recent Labs    11/21/19 2158  INR 1.2  APTT 28    BMP: Recent Labs    10/28/19 1935 10/28/19 1935 10/29/19 0401 10/29/19 0401 10/30/19 0542 10/30/19 0542 10/31/19 0523 11/09/19 2136 11/23/19 0225 11/25/19 0450 11/26/19 1021 11/27/19 0431  NA 136   < > 137   < > 132*   < > 135   < > 135 134* 135 136  K 4.2   < >  4.4   < > 4.4   < > 4.8   < > 3.7 3.9 3.6 3.5  CL 100   < > 102   < > 99   < > 99   < > 100 100 100 102  CO2 25   < > 24   < > 25   < > 25   < > 23 22 24 25   GLUCOSE 90   <  > 96   < > 150*   < > 141*   < > 125* 110* 112* 135*  BUN 10   < > 9   < > 11   < > 13   < > 14 11 9 10   CALCIUM 8.7*   < > 8.8*   < > 8.8*   < > 8.7*   < > 8.6* 8.7* 8.8* 8.4*  CREATININE 0.70   < > 0.81   < > 0.68   < > 0.64   < > 0.70 0.69 0.73 0.62  GFRNONAA >60   < > >60   < > >60   < > >60   < > >60 >60 >60 >60  GFRAA >60  --  >60  --  >60  --  >60  --   --   --   --   --    < > = values in this interval not displayed.    LIVER FUNCTION TESTS: Recent Labs    10/28/19 1935 10/29/19 0401 11/15/19 1243 11/21/19 1855  BILITOT 0.4 0.4 0.4 <0.1*  AST 19 16 21  71*  ALT 19 16 26  94*  ALKPHOS 85 80 86 89  PROT 7.4 6.9 7.2 6.3*  ALBUMIN 3.1* 2.9* 3.4* 2.9*    Assessment Evan Plan:  History of acute CVA (left temporooccipital infarct Evan R>L watershed infarcts) s/p diagnostic cerebral arteriogram 11/23/2019 by Dr. Estanislado Pandy revealing bilateral common carotid artery stenosis- thought to be cause of recent CVA (left-sided weakness- symptomatic from right CCA stenosis). Plan for image-guided cerebral arteriogram with possible revascularization (angioplasty, stent placement) of right common carotid artery stenosis in IR tentatively for tomorrow 11/28/2019 in IR with Dr. Estanislado Pandy Evan anesthesia pending IR schedule. Patient will be NPO at midnight. P2Y12 6 PRU today. Discussed with Dr. Estanislado Pandy who recommends discontinuing Brilinta 90 mg, begin taking Brilinta 45 mg twice daily, Evan continue taking Aspirin 81 mg once daily. Will recheck P2Y12 tomorrow AM prior to procedure.  Spoke with patient's son, Nohlan Burdin, via telephone at 1030 to discuss updates on patient's upcoming procedure (medication changes listed above). All questions answered Evan concerns addressed.   Electronically Signed: Earley Abide, PA-C 11/27/2019, 9:28 AM   I spent a total of 15 Minutes at the the patient's bedside Evan on the patient's hospital floor or unit, greater than 50% of which was counseling/coordinating  care for right common carotid artery stenosis/revascularization.

## 2019-11-27 NOTE — Progress Notes (Signed)
SLP Cancellation Note  Patient Details Name: Evan [name] MRN: [id number] DOB: [birth year]   Cancelled treatment:        Attempted to see for dysphagia tx. Pt with son and tech about to transfer from chair to bed. Pt with severe congested respirations and appeared very fatigued. Pt's son and therapist agreed to defer tx today. Will continue attempts.    [name] 11/27/2019, 3:59 PM  Orbie Pyo Colvin Caroli.Ed Risk analyst [phone] Office [phone]

## 2019-11-27 NOTE — Anesthesia Preprocedure Evaluation (Deleted)
Anesthesia Evaluation    Reviewed: Allergy & Precautions, Patient's Chart, lab work & pertinent test results  History of Anesthesia Complications Negative for: history of anesthetic complications  Airway        Dental   Pulmonary COPD, former smoker,           Cardiovascular hypertension,    '21 TTE - EF 55 to 60%. Grade I diastolic dysfunction (impaired relaxation). Elevated left atrial pressure. Trivial MR.    Neuro/Psych  Headaches, PSYCHIATRIC DISORDERS Anxiety Depression CVA, Residual Symptoms    GI/Hepatic negative GI ROS, Neg liver ROS,   Endo/Other  negative endocrine ROS  Renal/GU negative Renal ROS     Musculoskeletal negative musculoskeletal ROS (+)   Abdominal   Peds  Hematology  (+) anemia ,  On brillinta    Anesthesia Other Findings Covid test negative 11/21/19    Reproductive/Obstetrics                             Anesthesia Physical Anesthesia Plan  ASA: IV  Anesthesia Plan: General and MAC   Post-op Pain Management:    Induction: Intravenous  PONV Risk Score and Plan: 2 and Treatment may vary due to age or medical condition, Ondansetron and Dexamethasone  Airway Management Planned: Oral ETT  Additional Equipment:   Intra-op Plan:   Post-operative Plan: Extubation in OR  Informed Consent:   Plan Discussed with: CRNA and Anesthesiologist  Anesthesia Plan Comments: (May begin procedure as MAC if desired by proceduralist and convert to GETA as indicated by the procedure)        Anesthesia Quick Evaluation

## 2019-11-28 ENCOUNTER — Encounter (HOSPITAL_COMMUNITY)
Admission: EM | Disposition: A | Discharge status: Hospice | Payer: Self-pay | Source: Home / Self Care | Attending: Internal Medicine

## 2019-11-28 ENCOUNTER — Other Ambulatory Visit (HOSPITAL_COMMUNITY): Payer: Medicare PPO

## 2019-11-28 ENCOUNTER — Inpatient Hospital Stay (HOSPITAL_COMMUNITY): Payer: Medicare PPO

## 2019-11-28 ENCOUNTER — Encounter (HOSPITAL_COMMUNITY): Payer: Self-pay | Admitting: Internal Medicine

## 2019-11-28 DIAGNOSIS — J410 Simple chronic bronchitis: Secondary | ICD-10-CM | POA: Diagnosis not present

## 2019-11-28 DIAGNOSIS — I639 Cerebral infarction, unspecified: Secondary | ICD-10-CM | POA: Diagnosis not present

## 2019-11-28 DIAGNOSIS — Z515 Encounter for palliative care: Secondary | ICD-10-CM

## 2019-11-28 DIAGNOSIS — Z7189 Other specified counseling: Secondary | ICD-10-CM | POA: Diagnosis not present

## 2019-11-28 DIAGNOSIS — I10 Essential (primary) hypertension: Secondary | ICD-10-CM | POA: Diagnosis not present

## 2019-11-28 DIAGNOSIS — R0603 Acute respiratory distress: Secondary | ICD-10-CM

## 2019-11-28 LAB — COMPREHENSIVE METABOLIC PANEL
ALT: 28 U/L (ref 0–44)
AST: 28 U/L (ref 15–41)
Albumin: 2.1 g/dL — ABNORMAL LOW (ref 3.5–5.0)
Alkaline Phosphatase: 78 U/L (ref 38–126)
Anion gap: 9 (ref 5–15)
BUN: 11 mg/dL (ref 8–23)
CO2: 26 mmol/L (ref 22–32)
Calcium: 8.6 mg/dL — ABNORMAL LOW (ref 8.9–10.3)
Chloride: 103 mmol/L (ref 98–111)
Creatinine, Ser: 0.74 mg/dL (ref 0.61–1.24)
GFR, Estimated: >60 mL/min (ref >60)
Glucose, Bld: 119 mg/dL — ABNORMAL HIGH (ref 70–99)
Potassium: 3.8 mmol/L (ref 3.5–5.1)
Sodium: 138 mmol/L (ref 135–145)
Total Bilirubin: 0.4 mg/dL (ref 0.3–1.2)
Total Protein: 5.3 g/dL — ABNORMAL LOW (ref 6.5–8.1)

## 2019-11-28 LAB — PROTIME-INR
INR: 1.1 (ref 0.8–1.2)
Prothrombin Time: 14.2 seconds (ref 11.4–15.2)

## 2019-11-28 LAB — BRAIN NATRIURETIC PEPTIDE: B Natriuretic Peptide: 106.4 pg/mL — ABNORMAL HIGH (ref 0.0–100.0)

## 2019-11-28 LAB — CBC
HCT: 26.4 % — ABNORMAL LOW (ref 39.0–52.0)
Hemoglobin: 8.7 g/dL — ABNORMAL LOW (ref 13.0–17.0)
MCH: 27.5 pg (ref 26.0–34.0)
MCHC: 33 g/dL (ref 30.0–36.0)
MCV: 83.5 fL (ref 80.0–100.0)
Platelets: 424 10*3/uL — ABNORMAL HIGH (ref 150–400)
RBC: 3.16 MIL/uL — ABNORMAL LOW (ref 4.22–5.81)
RDW: 16.5 % — ABNORMAL HIGH (ref 11.5–15.5)
WBC: 10.6 10*3/uL — ABNORMAL HIGH (ref 4.0–10.5)
nRBC: 0 % (ref 0.0–0.2)

## 2019-11-28 LAB — PLATELET INHIBITION P2Y12: Platelet Function  P2Y12: 8 [PRU] — ABNORMAL LOW (ref 182–335)

## 2019-11-28 LAB — PROCALCITONIN: Procalcitonin: 0.1 ng/mL

## 2019-11-28 LAB — MRSA PCR SCREENING: MRSA by PCR: NEGATIVE

## 2019-11-28 SURGERY — IR WITH ANESTHESIA
Anesthesia: Monitor Anesthesia Care

## 2019-11-28 MED ORDER — TICAGRELOR 60 MG PO TABS
30.0000 mg | ORAL_TABLET | Freq: Two times a day (BID) | ORAL | Status: DC
  Administered 2019-11-28 – 2019-12-06 (×15): 30 mg via ORAL
  Filled 2019-11-28 (×19): qty 1

## 2019-11-28 MED ORDER — IPRATROPIUM-ALBUTEROL 0.5-2.5 (3) MG/3ML IN SOLN: 3.0000 mL | Freq: Three times a day (TID) | RESPIRATORY_TRACT | Status: DC

## 2019-11-28 MED ORDER — LACTATED RINGERS IV SOLN: INTRAVENOUS | Status: DC

## 2019-11-28 MED ORDER — CHLORHEXIDINE GLUCONATE 0.12 % MT SOLN
15.0000 mL | Freq: Once | OROMUCOSAL | Status: DC
  Filled 2019-11-28: qty 15

## 2019-11-28 MED ORDER — REVEFENACIN 175 MCG/3ML IN SOLN
175.0000 ug | Freq: Every day | RESPIRATORY_TRACT | Status: DC
  Administered 2019-11-28 – 2019-12-13 (×14): 175 ug via RESPIRATORY_TRACT
  Filled 2019-11-28 (×17): qty 3

## 2019-11-28 MED ORDER — ORAL CARE MOUTH RINSE: 15.0000 mL | Freq: Once | OROMUCOSAL | Status: DC

## 2019-11-28 MED ORDER — METHYLPREDNISOLONE SODIUM SUCC 40 MG IJ SOLR
40.0000 mg | Freq: Three times a day (TID) | INTRAMUSCULAR | Status: DC
  Administered 2019-11-28 – 2019-12-01 (×9): 40 mg via INTRAVENOUS
  Filled 2019-11-28 (×9): qty 1

## 2019-11-28 MED ORDER — ARFORMOTEROL TARTRATE 15 MCG/2ML IN NEBU
15.0000 ug | INHALATION_SOLUTION | Freq: Two times a day (BID) | RESPIRATORY_TRACT | Status: DC
  Administered 2019-11-28 – 2019-12-13 (×30): 15 ug via RESPIRATORY_TRACT
  Filled 2019-11-28 (×31): qty 2

## 2019-11-28 MED ORDER — ALBUTEROL SULFATE (2.5 MG/3ML) 0.083% IN NEBU
2.5000 mg | INHALATION_SOLUTION | Freq: Four times a day (QID) | RESPIRATORY_TRACT | Status: DC | PRN
  Administered 2019-11-29 – 2019-12-12 (×8): 2.5 mg via RESPIRATORY_TRACT
  Filled 2019-11-28 (×10): qty 3

## 2019-11-28 NOTE — Progress Notes (Signed)
STROKE TEAM PROGRESS NOTE   INTERVAL HISTORY His son is sat the bedside. Stent has been canceled today d/t respiratory distress.and dropping hemoglobin hematocrit despite Brilinta dose being reduced.  Neuro exam unchanged.  Vital signs stable.  Vitals:   11/27/19 2045 11/28/19 0011 11/28/19 0433 11/28/19 0733  BP:  105/82 104/76 (!) 146/85  Pulse:  98 92 (!) 101  Resp:  18 14 16   Temp:  97.6 F (36.4 C) 97.7 F (36.5 C) 97.8 F (36.6 C)  TempSrc:  Oral Oral Oral  SpO2: 95% 100% 100% 99%  Weight:      Height:       CBC:  Recent Labs  Lab 11/21/19 1855 11/21/19 1905 11/23/19 0225 11/25/19 0450 11/27/19 0431 11/28/19 0413  WBC 11.8*   < > 11.9*   < > 12.1* 10.6*  NEUTROABS 10.4*  --  10.7*  --   --   --   HGB 12.7*   < > 10.8*   < > 10.1* 8.7*  HCT 40.5   < > 33.6*   < > 31.3* 26.4*  MCV 87.3   < > 83.2   < > 83.2 83.5  PLT 421*   < > 425*   < > 440* 424*   < > = values in this interval not displayed.   Basic Metabolic Panel:  Recent Labs  Lab 11/27/19 0431 11/28/19 0413  NA 136 138  K 3.5 3.8  CL 102 103  CO2 25 26  GLUCOSE 135* 119*  BUN 10 11  CREATININE 0.62 0.74  CALCIUM 8.4* 8.6*    IMAGING past 24 hours No results found.  PHYSICAL EXAM      Temp:  [97.5 F (36.4 C)-98.4 F (36.9 C)] 97.8 F (36.6 C) (10/27 0733) Pulse Rate:  [92-105] 101 (10/27 0733) Resp:  [14-20] 16 (10/27 0733) BP: (104-146)/(76-92) 146/85 (10/27 0733) SpO2:  [95 %-100 %] 99 % (10/27 0733)  General -frail cachectic malnourished looking elderly male in no apparent distress. He has right below-knee amputation.  Ophthalmologic - fundi not visualized due to noncooperation.  Cardiovascular - Regular rhythm and rate.  Respiratory - b/l Rhonchi, secretion in throat with weak cough.  Neuro stable - awake alert, oriented x3.  No aphasia, hypophonic speech with mild dysarthria weak cough, able to name and repeat.  Follows commands.  No gaze preference.  Mild left facial droop,  tongue midline.  Left upper extremity 3+/5 deltoid, 3/5 bicep, 3/5 tricep and finger grip. Significant weakness of left hand intrinsic hand muscles. Right upper extremity at least 4/5.  Bilateral lower extremity at least 4/5 with right BKA.  Sensation symmetrical.  Finger-to-nose intact on the right.  Gait not tested.   ASSESSMENT/PLAN Evan [name] is a 69 y.o. male with history of recurrent non small cell lung cancer (stage IIIa status post chemo and radiation), traumatic right knee BKA, recent recurrent pneumonia, left renal mass, known prior bilateral carotid artery stenosis, COPD w/ recent exacerbation d/c 10/19. He has had generalized weakness and poor sleep, chronic swallowing, wt loss with poor apptetite and his son and dtr recently moved in to help him. Plans for prostate bx for which he had stopped his aspirin. On 10/20 presented to [facility] ED with difficulty w/ spatial awareness and vision guided movements, HA, L sided weakness. Transfered to [employer].  Stroke: Left temporooccipital infarct and R>L watershed infarcts in setting of known B CCA stenosis secondary to radiation  CT  head small L parietal hypodensity  MRI  L temporooccipital infarct. Additional scattered cortical + subcortical B watershed infarcts. Min petechial hemorrhage in L temporooccipital infarct. Old tiny L cerebellar infarct  CTA head & neck no LVO. 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 suspicion for L IJ thrombosis. B ICA siphon w/ mild stenosis. LLL opacity.   MRA head  Unremarkable   Cerebral angio confirmed bilateral CCA high-grade stenosis  2D Echo EF 55 to 60%  LDL 154   HgbA1c 6.3  VTE prophylaxis - Lovenox 40 mg sq daily   off aspirin for planned prostate bx prior to admission, now on aspirin 81 mg daily and brilinta 30 bid    Therapy recommendations:  SNF  Disposition:  pending   Palliative care involved - for family conference for  goals of care  Respiratory Distress / Agitation  Increased WOB 10/21 evening  Put on BIPAP w/ relief  Changed level to progressive care   Treated w/ ativan and haldol. Good relief w/ haldol  On mucomyst w/ improvement  Stent canceled d/t respiratory distress  For OP follow up   Carotid Stenosis   CTA neck 02/2017 Long segment of severe 80% right and severe 90% left proximal  common carotid artery stenosis.  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  Cerebral angio confirmed bilateral CCA high-grade stenosis  R CCA stenting cancelled for 10/27 Wednesday   Now on ASA and brilinta 30 bid  L parietooccipital lesion  MRI w/ severely degraded, enhancement L temporooccipital lobe, repeat recommended  Likely due to infarction  MRI with contrast nondiagnostic - motion due to SOB lying flat L IJ thrombosis  No prior line placement in neck per family, history of for Port-A-Cath on right chest per patient  CTA neck in 2019 also suspicious for left IJ thrombosis, however not for certain - discussed with Dr. 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 Avoid low BP . No apparent CHF history . On Flomax  . Long-term BP goal 130-150 given carotid stenosis.   Hyperlipidemia  Home meds:  No statin  Now on lipitor 80  LDL 154, goal < 70  Continue statin at discharge  Hematuria with urinary  retention  Foley  On Brilinta 30 bid w/ aspirin 81  HGB 11.3->10.1->8.7    OP follow up planned  Non Small cell lung CA  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   recurrence on the left side presented as a stage IIIa non-small cell lung cancer, squamous cell carcinoma status post concurrent chemoradiation followed by consolidation chemotherapy. Completed in March 2017.  He also underwent SBRT to the right middle lobe lung nodule under the care of Dr. [name] completed on 07/24/2018.  He also underwent SBRT to left lower lobe lung nodule  under the care of Dr. [name] completed on April 27, 2019.  Recent scan showed no concerning findings for disease progression  Followed with Dr. Julien Nordmann  Other Stroke Risk Factors  Advanced age  Cigarette smoker, quit 7 mos ago, advised to stop smoking  Other Active Problems  Recent COPD exacerbation, d/c 10/19. Nebs, pulmonary toilet for respiratory distress. On mucomyst  Recent recurrent PNA. CXR - slight increase in R basilar opacity possible atx vs infiltrate   Marked enlargement of the Prostate w/ plans for bx    Cachexia, Body mass index is 17.64 kg/m.   Traumatic right BKA  Tachycardia - low grade  Leukocytosis 10.6  TSH high, free T4 normal  Hospital day # 7  Patient is hematocrit is dropping and continues to have hematuria.  Interventional radiology has reduced the dose of Brilinta now to 30 mg twice daily and postpone the carotid angioplasty stenting.  I agree patient needs to be medically stable prior to doing this procedure.  Discussed with patient, his son, Dr. Reesa Chew and Dr. Estanislado Pandy.  Greater than 50% time during this 25-minute visit was spent in counseling and coordination of care and discussion with care team. Antony Contras, MD To contact Stroke Continuity provider, please refer to  [email or web address] After hours, contact General Neurology

## 2019-11-28 NOTE — Progress Notes (Addendum)
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 Estanislado Pandy revealing bilateral common carotid artery stenosis- thought to be cause of recent CVA (left-sided weakness- symptomatic from right CCA stenosis).  Groggy this am Son at bedside Obvious congestion in chest-- audible  [id number]  8 today    Allergies: Chantix [varenicline]  Medications: Prior to Admission medications   Medication Sig Start Date End Date Taking? Authorizing Provider  acetaminophen (TYLENOL) 500 MG tablet Take 500 mg by mouth every 6 (six) hours as needed for mild pain, fever or headache.    Yes [provider]  albuterol (VENTOLIN HFA) 108 (90 Base) MCG/ACT inhaler TAKE 2 PUFFS BY MOUTH EVERY 6 HOURS AS NEEDED FOR WHEEZE OR SHORTNESS OF BREATH Patient taking differently: Inhale 2 puffs into the lungs every 6 (six) hours as needed for wheezing or shortness of breath.  08/30/19  Yes Rigoberto Noel, MD  benzonatate (TESSALON) 100 MG capsule Take 1 capsule (100 mg total) by mouth every 8 (eight) hours. 11/09/19  Yes Dorie Rank, MD  Glycopyrrolate-Formoterol (BEVESPI AEROSPHERE) 9-4.8 MCG/ACT AERO Inhale 2 puffs into the lungs 2 (two) times daily. 06/26/19  Yes Rigoberto Noel, MD  guaiFENesin (MUCINEX) 600 MG 12 hr tablet Take 600 mg by mouth 2 (two) times daily as needed for cough.   Yes [provider]  guaiFENesin (ROBITUSSIN) 100 MG/5ML liquid Take 200 mg by mouth 3 (three) times daily as needed for cough.   Yes [provider]  nicotine (NICODERM CQ - DOSED IN MG/24 HOURS) 14 mg/24hr patch Place 1 patch (14 mg total) onto the skin daily. For 6 weeks.  Then decrease to 7 mg daily. 07/25/19  Yes Yopp, Amber C, RPH-CPP  tamsulosin  (FLOMAX) 0.4 MG CAPS capsule Take 0.4 mg by mouth at bedtime. 10/10/19  Yes [provider]  budesonide (PULMICORT) 0.5 MG/2ML nebulizer solution Take 2 mLs (0.5 mg total) by nebulization 2 (two) times daily. 11/21/19   Martyn Ehrich, NP  [employer] ASPIRIN EC 325 MG EC tablet TAKE 1 TABLET BY MOUTH EVERY DAY Patient taking differently: Take 325 mg by mouth daily.  08/16/19   Vaslow, Acey Lav, MD  ipratropium-albuterol (DUONEB) 0.5-2.5 (3) MG/3ML SOLN Take 3 mLs by nebulization every 6 (six) hours as needed. Patient taking differently: Take 3 mLs by nebulization every 6 (six) hours as needed (sob).  11/21/19   Martyn Ehrich, NP  nicotine polacrilex (NICORETTE) 4 MG gum Take 1 each (4 mg total) by mouth as needed for smoking cessation. Patient not taking: Reported on 11/15/2019 07/25/19   Yopp, [employer] C, RPH-CPP     Vital Signs: BP (!) 146/85 (BP Location: Right Arm)    Pulse (!) 101    Temp 97.8 F (36.6 C) (Oral)    Resp 16    Ht 5\' 5"  (1.651 m)    Wt 106 lb (48.1 kg)    SpO2 99%    BMI 17.64 kg/m   Physical Exam Vitals reviewed.  Cardiovascular:     Rate and Rhythm: Regular rhythm.  Pulmonary:     Effort: Pulmonary effort is normal.     Breath sounds: Rhonchi present.     Comments: Audible congestion Musculoskeletal:        General: Normal range  of motion.     Comments: Follows all commands  Skin:    General: Skin is warm.  Neurological:     Mental Status: He is alert.     Comments: Seems groggy this am Follows commands-- slow to respond  Psychiatric:     Comments: Son at bedside-- also signed consent     Imaging: DG CHEST PORT 1 VIEW  Result Date: 11/25/2019 CLINICAL DATA:  Pneumonia, history of lung cancer. EXAM: PORTABLE CHEST 1 VIEW COMPARISON:  November 22, 2019. FINDINGS: Stable cardiomediastinal silhouette. No pneumothorax or pleural effusion is noted. Left lung is clear. Slightly increased right basilar opacity is noted concerning for possible atelectasis or  infiltrate. Stable bilateral hilar findings consistent with retraction or fibrosis. Bony thorax is unremarkable. IMPRESSION: Slightly increased right basilar opacity is noted concerning for possible atelectasis or infiltrate. Electronically Signed   By: Marijo Conception M.D.   On: 11/25/2019 12:24   DG Swallowing Func-Speech Pathology  Result Date: 11/25/2019 Objective Swallowing Evaluation: Type of Study: MBS-Modified Barium Swallow Study  Patient Details Name: Evan [name] MRN: [id number] Date of Birth: 1950/08/27 Today's Date: 11/25/2019 Time: SLP Start Time (ACUTE ONLY): 1100 -SLP Stop Time (ACUTE ONLY): 1130 SLP Time Calculation (min) (ACUTE ONLY): 30 min Past Medical History: Past Medical History: Diagnosis Date  Anxiety   Complication of anesthesia   " sometimes I wake up during surgery "  Depression   Left renal mass 07/19/2016  Lung cancer ([place]) dx'd 2010   NSCL CA right  Lung cancer ([place]) dx'd 2017  left Past Surgical History: Past Surgical History: Procedure Laterality Date  AMPUTATION Right 05/21/2012  Procedure: AMPUTATION BELOW KNEE;  Surgeon: Newt Minion, MD;  Location: [facility];  Service: Orthopedics;  Laterality: Right;  AMPUTATION Right 06/21/2012  Procedure: Revision AMPUTATION BELOW KNEE Right;  Surgeon: Newt Minion, MD;  Location: [facility];  Service: Orthopedics;  Laterality: Right;  Revision right Below Knee Amputation  COLONOSCOPY W/ BIOPSIES AND POLYPECTOMY    FOOT AMPUTATION Right   traumatic right lower extremity  UPPER JAW    SURGERY FOR INFECTION HPI: Evan [name] is a 69 y.o. 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 Found to have acute left  parieto-occipital infarct. Now with worsening dysphagia over past 24-48 hours.  Subjective: alert but not very interactive Assessment / Plan / Recommendation CHL IP CLINICAL IMPRESSIONS 11/25/2019 Clinical Impression Patient presents with a moderate oropharyngeal dysphagia without aspiration but with incident of very trace penetration during swallow with thin liquids. Oral phase is characterized by weak oral control and movement of boluses, mastication was decreased and resulted in prolonged oral phase with regular solids. He exhibited premature spillage of thin and nectar liquids, delay in swallow initiation to level of vallecular sinus with puree solids and pyriform sinus with thin liquids. Inital swallows of puree solids, regular solids resulted in moderate vallecular residuals and mild pharyngeal residuals. Patient independently initiated swallow which helped clear residuals and sips of thin liquids helped clear residuals in pharynx to trace to minimal in amount. Overall, patient's airway is well protected during swallow with all tested consistencies and he is safe to initiate oral diet at at this time. SLP Visit Diagnosis Dysphagia, oropharyngeal phase (R13.12) Attention  and concentration deficit following -- Frontal lobe and executive function deficit following -- Impact on safety and function Mild aspiration risk   CHL IP TREATMENT RECOMMENDATION 11/25/2019 Treatment Recommendations Therapy as outlined in treatment plan below   Prognosis 11/25/2019 Prognosis for Safe Diet Advancement Good Barriers to Reach Goals -- Barriers/Prognosis Comment -- CHL IP DIET RECOMMENDATION 11/25/2019 SLP Diet Recommendations Thin liquid;Dysphagia 1 (Puree) solids Liquid Administration via Cup;Straw Medication Administration Whole meds with puree Compensations Minimize environmental distractions;Slow rate;Small sips/bites;Follow solids with liquid Postural Changes Remain semi-upright after after feeds/meals (Comment);Seated  upright at 90 degrees   CHL IP OTHER RECOMMENDATIONS 11/25/2019 Recommended Consults -- Oral Care Recommendations Oral care BID Other Recommendations --   CHL IP FOLLOW UP RECOMMENDATIONS 11/25/2019 Follow up Recommendations 24 hour supervision/assistance;Skilled Nursing facility;Home health SLP   CHL IP FREQUENCY AND DURATION 11/25/2019 Speech Therapy Frequency (ACUTE ONLY) min 2x/week Treatment Duration --      CHL IP ORAL PHASE 11/25/2019 Oral Phase Impaired Oral - Pudding Teaspoon -- Oral - Pudding Cup -- Oral - Honey Teaspoon -- Oral - Honey Cup -- Oral - Nectar Teaspoon -- Oral - Nectar Cup Weak lingual manipulation;Premature spillage Oral - Nectar Straw -- Oral - Thin Teaspoon -- Oral - Thin Cup Premature spillage Oral - Thin Straw Premature spillage Oral - Puree Premature spillage Oral - Mech Soft -- Oral - Regular Delayed oral transit;Weak lingual manipulation Oral - Multi-Consistency -- Oral - Pill Impaired mastication;Weak lingual manipulation;Delayed oral transit Oral Phase - Comment --  CHL IP PHARYNGEAL PHASE 11/25/2019 Pharyngeal Phase Impaired Pharyngeal- Pudding Teaspoon -- Pharyngeal -- Pharyngeal- Pudding Cup -- Pharyngeal -- Pharyngeal- Honey Teaspoon -- Pharyngeal -- Pharyngeal- Honey Cup -- Pharyngeal -- Pharyngeal- Nectar Teaspoon -- Pharyngeal -- Pharyngeal- Nectar Cup Delayed swallow initiation-vallecula;Pharyngeal residue - valleculae;Pharyngeal residue - pyriform Pharyngeal -- Pharyngeal- Nectar Straw -- Pharyngeal -- Pharyngeal- Thin Teaspoon -- Pharyngeal -- Pharyngeal- Thin Cup Delayed swallow initiation-vallecula;Delayed swallow initiation-pyriform sinuses;Penetration/Aspiration during swallow;Pharyngeal residue - valleculae;Pharyngeal residue - pyriform Pharyngeal Material enters airway, remains ABOVE vocal cords then ejected out Pharyngeal- Thin Straw Delayed swallow initiation-pyriform sinuses;Delayed swallow initiation-vallecula Pharyngeal -- Pharyngeal- Puree Delayed swallow  initiation-vallecula;Pharyngeal residue - valleculae Pharyngeal -- Pharyngeal- Mechanical Soft -- Pharyngeal -- Pharyngeal- Regular Pharyngeal residue - valleculae Pharyngeal -- Pharyngeal- Multi-consistency -- Pharyngeal -- Pharyngeal- Pill WFL Pharyngeal -- Pharyngeal Comment --  CHL IP CERVICAL ESOPHAGEAL PHASE 11/25/2019 Cervical Esophageal Phase WFL Pudding Teaspoon -- Pudding Cup -- Honey Teaspoon -- Honey Cup -- Nectar Teaspoon -- Nectar Cup -- Nectar Straw -- Thin Teaspoon -- Thin Cup -- Thin Straw -- Puree -- Mechanical Soft -- Regular -- Multi-consistency -- Pill -- Cervical Esophageal Comment -- Sonia Baller, MA, CCC-SLP Speech Therapy [facility]              Labs:  CBC: Recent Labs    11/25/19 0450 11/26/19 1021 11/27/19 0431 11/28/19 0413  WBC 14.3* 14.0* 12.1* 10.6*  HGB 11.7* 11.3* 10.1* 8.7*  HCT 36.0* 34.7* 31.3* 26.4*  PLT 490* 508* 440* 424*    COAGS: Recent Labs    11/21/19 2158 11/28/19 0413  INR 1.2 1.1  APTT 28  --     BMP: Recent Labs    10/28/19 1935 10/28/19 1935 10/29/19 0401 10/29/19 0401 10/30/19 0542 10/30/19 0542 10/31/19 0523 11/09/19 2136 11/25/19 0450 11/26/19 1021 11/27/19 0431 11/28/19 0413  NA 136   < > 137   < > 132*   < > 135   < > 134* 135  136 138  K 4.2   < > 4.4   < > 4.4   < > 4.8   < > 3.9 3.6 3.5 3.8  CL 100   < > 102   < > 99   < > 99   < > 100 100 102 103  CO2 25   < > 24   < > 25   < > 25   < > 22 24 25 26   GLUCOSE 90   < > 96   < > 150*   < > 141*   < > 110* 112* 135* 119*  BUN 10   < > 9   < > 11   < > 13   < > 11 9 10 11   CALCIUM 8.7*   < > 8.8*   < > 8.8*   < > 8.7*   < > 8.7* 8.8* 8.4* 8.6*  CREATININE 0.70   < > 0.81   < > 0.68   < > 0.64   < > 0.69 0.73 0.62 0.74  GFRNONAA >60   < > >60   < > >60   < > >60   < > >60 >60 >60 >60  GFRAA >60  --  >60  --  >60  --  >60  --   --   --   --   --    < > = values in this interval not displayed.    LIVER FUNCTION TESTS: Recent Labs    10/29/19 0401  11/15/19 1243 11/21/19 1855 11/28/19 0413  BILITOT 0.4 0.4 <0.1* 0.4  AST 16 21 71* 28  ALT 16 26 94* 28  ALKPHOS 80 86 89 78  PROT 6.9 7.2 6.3* 5.3*  ALBUMIN 2.9* 3.4* 2.9* 2.1*    Assessment and Plan:  Plan for image-guided cerebral arteriogram with possible revascularization (angioplasty, stent placement) of right common carotid artery stenosis in IR. Risks and benefits of cerebral angiogram with intervention were discussed with the patient including, but not limited to bleeding, infection, vascular injury, contrast induced renal failure, stroke or even death.  This interventional procedure involves the use of X-rays and because of the nature of the planned procedure, it is possible that we will have prolonged use of X-ray fluoroscopy.  Potential radiation risks to you include (but are not limited to) the following: - A slightly elevated risk for cancer  several years later in life. This risk is typically less than 0.5% percent. This risk is low in comparison to the normal incidence of human cancer, which is 33% for women and 50% for men according to the [place]. - Radiation induced injury can include skin redness, resembling a rash, tissue breakdown / ulcers and hair loss (which can be temporary or permanent).   The likelihood of either of these occurring depends on the difficulty of the procedure and whether you are sensitive to radiation due to previous procedures, disease, or genetic conditions.   IF your procedure requires a prolonged use of radiation, you will be notified and given written instructions for further action.  It is your responsibility to monitor the irradiated area for the 2 weeks following the procedure and to notify your physician if you are concerned that you have suffered a radiation induced injury.    All of the patient's questions were answered, patient is agreeable to proceed.  Consent signed and in chart. Pts son is aware of procedure  benefits and risks. He has  also signed consent   Electronically Signed: Lavonia Drafts, PA-C 11/28/2019, 8:22 AM   I spent a total of 15 Minutes at the the patient's bedside AND on the patient's hospital floor or unit, greater than 50% of which was counseling/coordinating care for Rt common carotid artery revascularization

## 2019-11-28 NOTE — Progress Notes (Signed)
°   11/28/19 1405  Clinical Encounter Type  Visited With Patient and family together  Visit Type Follow-up  Referral From Chaplain  Consult/Referral To Chaplain  Stress Factors  Patient Stress Factors Exhausted   Chaplain attempted a visit again. Pts son, Vania Rea, was in the room. Pt was tired after nurse and doctor visits. Chaplain said they could try again at a later time.   This note was prepared by Chaplain Resident, Dante Gang, MDiv. For questions, please contact by phone at [phone].

## 2019-11-28 NOTE — TOC Progression Note (Addendum)
Transition of Care [facility]) - Progression Note    Patient Details  Name: Evan [name] MRN: [id number] Date of Birth: [birth year]  Transition of Care [facility]) CM/SW [place], [place] Phone Number: 11/28/2019, 11:33 AM  Clinical Narrative:   CSW received call from patient's daughter to discuss SNF options. Daughter is hopeful to finish her research into SNF and call CSW back later today with choice. CSW to follow.  UPDATE: CSW received call back from daughter that they'd like to move forward with [facility]. CSW asked [facility] about bed availability. Will initiate insurance authorization request when medically stable for DC.   Expected Discharge Plan: Skilled Nursing Facility Barriers to Discharge: Ship broker, Continued Medical Work up  Expected Discharge Plan and Services Expected Discharge Plan: [place]                                               Social Determinants of Health (SDOH) Interventions    Readmission Risk Interventions No flowsheet data found.

## 2019-11-28 NOTE — Progress Notes (Signed)
  Pt has had some changes since last seen. He now with hematuria and Bilat rhonci; generally not well  Will HOLD on this procedure for now per Dr Estanislado Pandy and anesthesia Need to optimize pt and adjust anticoagulation  Dr Estanislado Pandy discussed with Anesthesia and Dr Reesa Chew All agree to [facility] at this time  Plan for tentatively trying to rescheduled while still an inpatient.  Son is agreeable

## 2019-11-28 NOTE — Consult Note (Addendum)
Urology Consult    Physician requesting consult: Dr. Gerlean Ren  Reason for consult: Clot retention  History of Present Illness: Evan [name] is a 69 y.o. with history of Lung Ca, recurrent PNA, traumatic RLE amputation, currently hospitalized for acute CVA and on brilinta and ASA. Urology was consulted for new hematuria after catheter placement a few days ago. He has a known history of BPH and has had catheter placements in the past for urinary retention with previous episodes of hematuria. Catheter draining with minimal small clots overnight but not draining as well over the course of the day with retention of about 300cc despite 16Fr catheter in correct position. He is currently HDS, afebrile, Hgb stable, has not required transfusions.  I EXAMINED PATIENT TODAY AND CATH DRAINING WELL- FLUSHED TO PINK I performed a history and physical examination of the patient and discussed his management with the resident.  I reviewed the resident's note and agree with the documented findings and plan of care       Past Medical History:  Diagnosis Date   Anxiety    Complication of anesthesia    " sometimes I wake up during surgery "   Depression    Left renal mass 07/19/2016   Lung cancer ([place]) dx'd 2010    NSCL CA right   Lung cancer ([place]) dx'd 2017   left    Past Surgical History:  Procedure Laterality Date   AMPUTATION Right 05/21/2012   Procedure: AMPUTATION BELOW KNEE;  Surgeon: Newt Minion, MD;  Location: [place];  Service: Orthopedics;  Laterality: Right;   AMPUTATION Right 06/21/2012   Procedure: Revision AMPUTATION BELOW KNEE Right;  Surgeon: Newt Minion, MD;  Location: [place];  Service: Orthopedics;  Laterality: Right;  Revision right Below Knee Amputation   COLONOSCOPY W/ BIOPSIES AND POLYPECTOMY     FOOT AMPUTATION Right    traumatic right lower extremity   IR ANGIO INTRA EXTRACRAN SEL COM CAROTID INNOMINATE BILAT MOD SED  11/23/2019   IR ANGIO VERTEBRAL SEL SUBCLAVIAN INNOMINATE  UNI R MOD SED  11/23/2019   IR ANGIO VERTEBRAL SEL VERTEBRAL UNI L MOD SED  11/23/2019   IR US GUIDE VASC ACCESS RIGHT  11/23/2019   UPPER JAW     SURGERY FOR INFECTION    Current Hospital Medications:  Home Meds:  No current facility-administered medications on file prior to encounter.   Current Outpatient Medications on File Prior to Encounter  Medication Sig Dispense Refill   acetaminophen (TYLENOL) 500 MG tablet Take 500 mg by mouth every 6 (six) hours as needed for mild pain, fever or headache.      albuterol (VENTOLIN HFA) 108 (90 Base) MCG/ACT inhaler TAKE 2 PUFFS BY MOUTH EVERY 6 HOURS AS NEEDED FOR WHEEZE OR SHORTNESS OF BREATH (Patient taking differently: Inhale 2 puffs into the lungs every 6 (six) hours as needed for wheezing or shortness of breath. ) 18 g 3   benzonatate (TESSALON) 100 MG capsule Take 1 capsule (100 mg total) by mouth every 8 (eight) hours. 21 capsule 0   Glycopyrrolate-Formoterol (BEVESPI AEROSPHERE) 9-4.8 MCG/ACT AERO Inhale 2 puffs into the lungs 2 (two) times daily. 10.7 g 11   guaiFENesin (MUCINEX) 600 MG 12 hr tablet Take 600 mg by mouth 2 (two) times daily as needed for cough.     guaiFENesin (ROBITUSSIN) 100 MG/5ML liquid Take 200 mg by mouth 3 (three) times daily as needed for cough.     nicotine (NICODERM CQ - DOSED  IN MG/24 HOURS) 14 mg/24hr patch Place 1 patch (14 mg total) onto the skin daily. For 6 weeks.  Then decrease to 7 mg daily. 28 patch 1   tamsulosin (FLOMAX) 0.4 MG CAPS capsule Take 0.4 mg by mouth at bedtime.     budesonide (PULMICORT) 0.5 MG/2ML nebulizer solution Take 2 mLs (0.5 mg total) by nebulization 2 (two) times daily. 120 mL 0   [employer] ASPIRIN EC 325 MG EC tablet TAKE 1 TABLET BY MOUTH EVERY DAY (Patient taking differently: Take 325 mg by mouth daily. ) 90 tablet 1   ipratropium-albuterol (DUONEB) 0.5-2.5 (3) MG/3ML SOLN Take 3 mLs by nebulization every 6 (six) hours as needed. (Patient taking differently: Take 3 mLs by nebulization  every 6 (six) hours as needed (sob). ) 360 mL 0   nicotine polacrilex (NICORETTE) 4 MG gum Take 1 each (4 mg total) by mouth as needed for smoking cessation. (Patient not taking: Reported on 11/15/2019) 100 tablet 0     Scheduled Meds:   stroke: mapping our early stages of recovery book   Does not apply Once   acetylcysteine  4 mL Nebulization BID   arformoterol  15 mcg Nebulization BID   aspirin EC  81 mg Oral Daily   atorvastatin  80 mg Oral Daily   Chlorhexidine Gluconate Cloth  6 each Topical Daily   enoxaparin (LOVENOX) injection  40 mg Subcutaneous Q24H   feeding supplement  237 mL Oral TID BM   guaiFENesin  600 mg Oral BID   methylPREDNISolone (SOLU-MEDROL) injection  40 mg Intravenous Q8H   multivitamin with minerals  1 tablet Oral Daily   nicotine  14 mg Transdermal Daily   pantoprazole  40 mg Oral BID   revefenacin  175 mcg Nebulization Daily   tamsulosin  0.4 mg Oral QHS   ticagrelor  30 mg Oral BID   Continuous Infusions:  ampicillin-sulbactam (UNASYN) IV 3 g (11/28/19 1636)   PRN Meds:.acetaminophen **OR** acetaminophen (TYLENOL) oral liquid 160 mg/5 mL **OR** acetaminophen, albuterol, hydrOXYzine, LORazepam, senna-docusate  Allergies:  Allergies  Allergen Reactions   Chantix [Varenicline] Other (See Comments)    Weird dreams    Family History  Problem Relation Age of Onset   Heart disease Mother    Diabetes Mother    Prostate cancer Father     Social History:  reports that he quit smoking about 7 months ago. His smoking use included cigarettes. He has a 22.50 pack-year smoking history. He has never used smokeless tobacco. He reports that he does not drink alcohol and does not use drugs.  ROS: A complete review of systems was performed.  All systems are negative except for pertinent findings as noted.  Physical Exam:  Vital signs in last 24 hours: Temp:  [97.6 F (36.4 C)-98.7 F (37.1 C)] 98.4 F (36.9 C) (10/27 1700) Pulse Rate:  [92-116] 108 (10/27  1700) Resp:  [14-20] 20 (10/27 1551) BP: (99-146)/(76-85) 99/76 (10/27 1700) SpO2:  [95 %-100 %] 100 % (10/27 1551) Constitutional:  Alert and oriented, No acute distress Cardiovascular: Regular rate and rhythm, No JVD Respiratory: Normal respiratory effort, Lungs clear bilaterally GI: Abdomen is soft, nontender, nondistended, no abdominal masses GU: No CVA tenderness. Catheter in place draining clear pink urine following replacement and irrigation. (22Fr 3way with 3rd port plugged) Lymphatic: No lymphadenopathy Neurologic: Grossly intact, no focal deficits Psychiatric: Normal mood and affect  Laboratory Data:  Recent Labs    11/26/19 1021 11/27/19 0431 11/28/19 0413  WBC 14.0*  12.1* 10.6*  HGB 11.3* 10.1* 8.7*  HCT 34.7* 31.3* 26.4*  PLT 508* 440* 424*    Recent Labs    11/26/19 1021 11/27/19 0431 11/28/19 0413  NA 135 136 138  K 3.6 3.5 3.8  CL 100 102 103  GLUCOSE 112* 135* 119*  BUN 9 10 11   CALCIUM 8.8* 8.4* 8.6*  CREATININE 0.73 0.62 0.74     Results for orders placed or performed during the hospital encounter of 11/21/19 (from the past 24 hour(s))  Platelet inhibition p2y12 (Not at [facility])     Status: Abnormal   Collection Time: 11/28/19  4:13 AM  Result Value Ref Range   Platelet Function  P2Y12 8 (L) 182 - 335 PRU  CBC     Status: Abnormal   Collection Time: 11/28/19  4:13 AM  Result Value Ref Range   WBC 10.6 (H) 4.0 - 10.5 K/uL   RBC 3.16 (L) 4.22 - 5.81 MIL/uL   Hemoglobin 8.7 (L) 13.0 - 17.0 g/dL   HCT 26.4 (L) 39 - 52 %   MCV 83.5 80.0 - 100.0 fL   MCH 27.5 26.0 - 34.0 pg   MCHC 33.0 30.0 - 36.0 g/dL   RDW 16.5 (H) 11.5 - 15.5 %   Platelets 424 (H) 150 - 400 K/uL   nRBC 0.0 0.0 - 0.2 %  Comprehensive metabolic panel     Status: Abnormal   Collection Time: 11/28/19  4:13 AM  Result Value Ref Range   Sodium 138 135 - 145 mmol/L   Potassium 3.8 3.5 - 5.1 mmol/L   Chloride 103 98 - 111 mmol/L   CO2 26 22 - 32 mmol/L   Glucose, Bld 119 (H) 70 -  99 mg/dL   BUN 11 8 - 23 mg/dL   Creatinine, Ser 0.74 0.61 - 1.24 mg/dL   Calcium 8.6 (L) 8.9 - 10.3 mg/dL   Total Protein 5.3 (L) 6.5 - 8.1 g/dL   Albumin 2.1 (L) 3.5 - 5.0 g/dL   AST 28 15 - 41 U/L   ALT 28 0 - 44 U/L   Alkaline Phosphatase 78 38 - 126 U/L   Total Bilirubin 0.4 0.3 - 1.2 mg/dL   GFR, Estimated >60 >60 mL/min   Anion gap 9 5 - 15  Protime-INR     Status: None   Collection Time: 11/28/19  4:13 AM  Result Value Ref Range   Prothrombin Time 14.2 11.4 - 15.2 seconds   INR 1.1 0.8 - 1.2  MRSA PCR Screening     Status: None   Collection Time: 11/28/19  4:13 AM   Specimen: Nasal Mucosa; Nasopharyngeal  Result Value Ref Range   MRSA by PCR NEGATIVE NEGATIVE  Brain natriuretic peptide     Status: Abnormal   Collection Time: 11/28/19 10:16 AM  Result Value Ref Range   B Natriuretic Peptide 106.4 (H) 0.0 - 100.0 pg/mL  Procalcitonin - Baseline     Status: None   Collection Time: 11/28/19 10:16 AM  Result Value Ref Range   Procalcitonin 0.10 ng/mL   Recent Results (from the past 240 hour(s))  Resp Panel by RT PCR (RSV, Flu A&B, Covid) - Nasopharyngeal Swab     Status: None   Collection Time: 11/21/19  7:08 PM   Specimen: Nasopharyngeal Swab  Result Value Ref Range Status   SARS Coronavirus 2 by RT PCR NEGATIVE NEGATIVE Final    Comment: (NOTE) SARS-CoV-2 target nucleic acids are NOT DETECTED.  The SARS-CoV-2 RNA is  generally detectable in upper respiratoy specimens during the acute phase of infection. The lowest concentration of SARS-CoV-2 viral copies this assay can detect is 131 copies/mL. A negative result does not preclude SARS-Cov-2 infection and should not be used as the sole basis for treatment or other patient management decisions. A negative result may occur with  improper specimen collection/handling, submission of specimen other than nasopharyngeal swab, presence of viral mutation(s) within the areas targeted by this assay, and inadequate number of  viral copies (<131 copies/mL). A negative result must be combined with clinical observations, patient history, and epidemiological information. The expected result is Negative.  Fact Sheet for Patients:  PinkCheek.be  Fact Sheet for Healthcare Providers:  GravelBags.it  This test is no t yet approved or cleared by the [place] FDA and  has been authorized for detection and/or diagnosis of SARS-CoV-2 by FDA under an Emergency Use Authorization (EUA). This EUA will remain  in effect (meaning this test can be used) for the duration of the COVID-19 declaration under Section 564(b)(1) of the Act, 21 U.S.C. section 360bbb-3(b)(1), unless the authorization is terminated or revoked sooner.     Influenza A by PCR NEGATIVE NEGATIVE Final   Influenza B by PCR NEGATIVE NEGATIVE Final    Comment: (NOTE) The Xpert Xpress SARS-CoV-2/FLU/RSV assay is intended as an aid in  the diagnosis of influenza from Nasopharyngeal swab specimens and  should not be used as a sole basis for treatment. Nasal washings and  aspirates are unacceptable for Xpert Xpress SARS-CoV-2/FLU/RSV  testing.  Fact Sheet for Patients: PinkCheek.be  Fact Sheet for Healthcare Providers: GravelBags.it  This test is not yet approved or cleared by the [place] FDA and  has been authorized for detection and/or diagnosis of SARS-CoV-2 by  FDA under an Emergency Use Authorization (EUA). This EUA will remain  in effect (meaning this test can be used) for the duration of the  Covid-19 declaration under Section 564(b)(1) of the Act, 21  U.S.C. section 360bbb-3(b)(1), unless the authorization is  terminated or revoked.    Respiratory Syncytial Virus by PCR NEGATIVE NEGATIVE Final    Comment: (NOTE) Fact Sheet for Patients: PinkCheek.be  Fact Sheet for Healthcare  Providers: GravelBags.it  This test is not yet approved or cleared by the [place] FDA and  has been authorized for detection and/or diagnosis of SARS-CoV-2 by  FDA under an Emergency Use Authorization (EUA). This EUA will remain  in effect (meaning this test can be used) for the duration of the  COVID-19 declaration under Section 564(b)(1) of the Act, 21 U.S.C.  section 360bbb-3(b)(1), unless the authorization is terminated or  revoked. Performed at [facility], [place] [address], [place], [place] [address]   MRSA PCR Screening     Status: None   Collection Time: 11/28/19  4:13 AM   Specimen: Nasal Mucosa; Nasopharyngeal  Result Value Ref Range Status   MRSA by PCR NEGATIVE NEGATIVE Final    Comment:        The GeneXpert MRSA Assay (FDA approved for NASAL specimens only), is one component of a comprehensive MRSA colonization surveillance program. It is not intended to diagnose MRSA infection nor to guide or monitor treatment for MRSA infections. Performed at [facility], [place] [address] [place], [place] [address]     Renal Function: Recent Labs    11/21/19 2115 11/22/19 0347 11/23/19 0225 11/25/19 0450 11/26/19 1021 11/27/19 0431 11/28/19 0413  CREATININE 0.60* 0.52* 0.70 0.69 0.73 0.62 0.74  Estimated Creatinine Clearance: 60.1 mL/min (by C-G formula based on SCr of 0.74 mg/dL).  Radiologic Imaging: DG Chest Port 1 View  Result Date: 11/28/2019 CLINICAL DATA:  Dyspnea EXAM: PORTABLE CHEST 1 VIEW COMPARISON:  Prior chest x-ray 11/25/2019 FINDINGS: Stable chronic atelectasis of the right upper lobe resulting in hyperinflation of the right lower and right middle lobes. Similar appearance of mild patchy airspace opacities in the right lung base. No significant interval progression. Cardiac and mediastinal contours remain unchanged. Chronic bronchitic changes are unchanged. No pleural effusion or pneumothorax. No  acute osseous abnormality. IMPRESSION: Stable chest x-ray without significant interval change compared to 11/25/2019. Electronically Signed   By: Jacqulynn Cadet M.D.   On: 11/28/2019 09:08    I independently reviewed the above imaging studies.  Impression/Recommendation 69yo M with BPH, intermittent urinary retention who has clot retention in the setting of active anticoagulation for CVA.   - foley was exchanged by urology at bedside on 10/27 and upsized to 22Fr 3way Catheter, hand irrigated to clear pink with return of 50cc clot. No CBI necessary, third port of catheter is capped. - recommend continuation of foley catheter at this time for at least 7d in the setting of possible catheter trauma during previous placement - please flush catheter as needed if not draining or if there are clots present  using 60-120cc saline through the main drainage port of the catheter. - if catheter is not draining despite several gentle flushes, please page urology as this may indicate need for further hand irrigation and possible continuous irrigation. - patient should follow up as outpatient with Dr. Abner Greenspan who he has seen previously  Ermelinda Das 11/28/2019, 7:36 PM

## 2019-11-28 NOTE — Progress Notes (Signed)
   11/28/19 1551  Assess: MEWS Score  Temp 98.4 F (36.9 C)  BP 135/85  Pulse Rate (!) 116  Resp 20  Level of Consciousness Alert  SpO2 100 %  O2 Device Nasal Cannula  Assess: MEWS Score  MEWS Temp 0  MEWS Systolic 0  MEWS Pulse 2  MEWS RR 0  MEWS LOC 0  MEWS Score 2  MEWS Score Color Yellow  Assess: if the MEWS score is Yellow or Red  Were vital signs taken at a resting state? Yes  Focused Assessment No change from prior assessment  Early Detection of Sepsis Score *See Row Information* Low  MEWS guidelines implemented *See Row Information* Yes  Treat  MEWS Interventions Other (Comment)  Pain Scale 0-10  Pain Score 0  Patients response to intervention Effective  Take Vital Signs  Increase Vital Sign Frequency  Yellow: Q 2hr X 2 then Q 4hr X 2, if remains yellow, continue Q 4hrs  Escalate  MEWS: Escalate Yellow: discuss with charge nurse/RN and consider discussing with provider and RRT  Notify: Charge Nurse/RN  Name of Charge Nurse/RN Notified Lauryn RN  Date Charge Nurse/RN Notified 11/28/19  Time Charge Nurse/RN Notified 1554  Notify: Provider  Provider Name/Title MD Reesa Chew  Date Provider Notified 11/28/19  Time Provider Notified 1554  Notification Type Page  Document  Patient Outcome Not stable and remains on department  Progress note created (see row info) Yes  Charge and MD notified.

## 2019-11-28 NOTE — Significant Event (Addendum)
Rapid Response Event Note   Reason for Call :  Acute oxygen desaturation, recovered on [facility]  Initial Focused Assessment:  Pt lying in bed, alert. Nodding appropriately, speech is whispered. Lung sounds are rhonchus throughout. Pt will not follow command to cough. Oral suctioned with only small amount of oral secretion removed. Gag reflex is present, weak. Per staff, pt has been refusing NTS. Per prior MD note, pt has had a "wet phonation".   VS: T 97.23F, BP 146/85, HR 101, SpO2 99% on 2LNC  Interventions:  -Oral suction -RT at bedside administering scheduled nebs  No new orders or interventions from RR RN.   Plan of Care:  -Pulmonary hygiene: Encourage coughing and deep breathing, turn & reposition, oral care per protocol -Aspiration precautions -Titrate oxygen as pt tolerates  Call rapid response for additional needs Event Summary:  MD Notified: RN to notify primary attending Call Time: 0726 Arrival Time: 0728 End Time: Deputy, RN

## 2019-11-28 NOTE — Progress Notes (Signed)
PT Cancellation Note  Patient Details Name: Evan [name] MRN: [id number] DOB: 10/07/1950   Cancelled Treatment:    Reason Eval/Treat Not Completed: Patient at procedure or test/unavailable - will check back as schedule allows.  [place] Pager [phone]  Office [phone]     Roxine Caddy D Elonda Husky 11/28/2019, 8:26 AM

## 2019-11-28 NOTE — Progress Notes (Addendum)
PROGRESS NOTE    Evan [name]  IWP:[id number] DOB: 1950/11/04 DOA: 11/21/2019 PCP: Patient, No Pcp Per   Brief Narrative:  69 y.o. 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 Found to have a CVA: needs carotid stents.  Has other issues that will need outpatient follow up: hematuria and dysphagia/wet phonation.  Stent placement was on hold due to significant abnormal breath sounds.    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  Aspiration precaution. -Check BNP, procalcitonin -Scheduled bronchodilators-DuoNeb, Pulmicort -Solu-Medrol added. -History of dysphagia-previous provider discussed with ENT-recommending Mucomyst.  Acute nonhemorrhagic CVA, left temporal occipital region -LDL 154, A1c 6.3.  Echocardiogram EF 55 to 60%.  CTA head and neck did not show LVO but showed severe bilateral CAD stenosis therefore IR consulted for stent placement. -Currently on Brilinta and aspirin -Stent placement on hold today due to abnormal breathing  Essential  hypertension -Permissive hypertension  Hematuria -Suspect from mild traumatic Foley placement.  History of BPH.  Continue to monitor, if necessary he might need bladder irrigation.  Addendum at 1:30 PM -Given respiratory status and hematuria-family requested pulmonary and urology consult.  I spoke with pulmonary and urology, their input will be appreciated.  Case extensively discussed with patient's daughter and her son over the phone by me as well.  DVT prophylaxis: enoxaparin (LOVENOX) injection 40 mg Start: 11/22/19 1000  Code Status: Full Code Family Communication:    Status is: Inpatient  Remains inpatient appropriate because:Hemodynamically unstable   Dispo: The patient is from: SNF              Anticipated d/c is to: SNF              Anticipated d/c date is: > 3 days              Patient currently is not medically stable to d/c.  Still significant abnormal breath sounds awaiting stent placement procedure.    Body mass index is 17.64 kg/m.     Subjective: Patient was taken down to IR this morning for stent placement but due to concerns of his respiration he was sent back to the room.  I evaluated the patient at bedside he had diffuse coarse breath sounds but was saturating greater than 95% on room air.  Slightly tachypneic but comfortable.  Denies any other complaints.  Review of Systems Otherwise negative except as per HPI, including: General: Denies fever, chills, night sweats or unintended weight loss. Resp: Denies hemoptysis Cardiac: Denies chest pain, palpitations, orthopnea, paroxysmal nocturnal dyspnea. GI: Denies abdominal pain, nausea, vomiting, diarrhea or constipation GU: Denies dysuria, frequency, hesitancy or incontinence  MS: Denies muscle aches, joint pain or swelling Neuro: Denies headache, neurologic deficits (focal weakness, numbness, tingling), abnormal gait Psych: Denies anxiety, depression, SI/HI/AVH Skin: Denies new rashes or lesions ID: Denies  sick contacts, exotic exposures, travel  Examination:  Constitutional: Not in acute distress, chronically ill Respiratory: Bilateral diffuse rhonchi Cardiovascular: Normal sinus rhythm, no rubs Abdomen: Nontender nondistended good bowel sounds Musculoskeletal: No edema noted Skin: No rashes seen Neurologic: CN 2-12 grossly intact.  And nonfocal Psychiatric: Normal judgment and insight. Alert and oriented x 3. Normal mood.  Objective: Vitals:   11/27/19 2045 11/28/19 0011 11/28/19 0433 11/28/19 0733  BP:  105/82 104/76 (!) 146/85  Pulse:  98 92 (!) 101  Resp:  18 14 16   Temp:  97.6 F (36.4 C) 97.7 F (36.5 C) 97.8 F (36.6 C)  TempSrc:  Oral Oral Oral  SpO2: 95% 100% 100% 99%  Weight:      Height:        Intake/Output Summary (Last 24 hours) at 11/28/2019 1125 Last data filed at 11/28/2019 3570 Gross per 24 hour  Intake --  Output 950 ml  Net -950 ml   Filed Weights   11/21/19 1819  Weight: 48.1 kg     Data Reviewed:   CBC: Recent Labs  Lab 11/21/19 1855 11/21/19 1905 11/23/19 0225 11/25/19 0450 11/26/19 1021 11/27/19 0431 11/28/19 0413  WBC 11.8*   < > 11.9* 14.3* 14.0* 12.1* 10.6*  NEUTROABS 10.4*  --  10.7*  --   --   --   --   HGB 12.7*   < > 10.8* 11.7* 11.3* 10.1* 8.7*  HCT 40.5   < > 33.6* 36.0* 34.7* 31.3* 26.4*  MCV 87.3   < > 83.2 82.6 84.0 83.2 83.5  PLT 421*   < > 425* 490* 508* 440* 424*   < > = values in this interval not displayed.   Basic Metabolic Panel: Recent Labs  Lab 11/23/19 0225 11/25/19 0450 11/26/19 1021 11/27/19 0431 11/28/19 0413  NA 135 134* 135 136 138  K 3.7 3.9 3.6 3.5 3.8  CL 100 100 100 102 103  CO2 23 22 24 25 26   GLUCOSE 125* 110* 112* 135* 119*  BUN 14 11 9 10 11   CREATININE 0.70 0.69 0.73 0.62 0.74  CALCIUM 8.6* 8.7* 8.8* 8.4* 8.6*   GFR: Estimated Creatinine Clearance: 60.1 mL/min (by C-G formula based on SCr of 0.74 mg/dL). Liver Function Tests: Recent Labs  Lab 11/21/19 1855 11/28/19 0413   AST 71* 28  ALT 94* 28  ALKPHOS 89 78  BILITOT <0.1* 0.4  PROT 6.3* 5.3*  ALBUMIN 2.9* 2.1*   No results for input(s): LIPASE, AMYLASE in the last 168 hours. No results for input(s): AMMONIA in the last 168 hours. Coagulation Profile: Recent Labs  Lab 11/21/19 2158 11/28/19 0413  INR 1.2 1.1   Cardiac Enzymes: No results for input(s): CKTOTAL, CKMB, CKMBINDEX, TROPONINI in the last 168 hours. BNP (last 3 results) No results for input(s): PROBNP in the last 8760 hours. HbA1C: No results for input(s): HGBA1C in the last 72 hours. CBG: Recent Labs  Lab 11/21/19 1841 11/22/19 1052  GLUCAP 128* 92   Lipid Profile: No results for input(s): CHOL, HDL, LDLCALC, TRIG, CHOLHDL, LDLDIRECT in the last 72 hours. Thyroid Function Tests: Recent Labs    11/26/19 1021 11/27/19 0431  TSH 4.724*  --   FREET4  --  1.03   Anemia Panel: No results for input(s): VITAMINB12, FOLATE, FERRITIN, TIBC,  IRON, RETICCTPCT in the last 72 hours. Sepsis Labs: No results for input(s): PROCALCITON, LATICACIDVEN in the last 168 hours.  Recent Results (from the past 240 hour(s))  Resp Panel by RT PCR (RSV, Flu A&B, Covid) - Nasopharyngeal Swab     Status: None   Collection Time: 11/21/19  7:08 PM   Specimen: Nasopharyngeal Swab  Result Value Ref Range Status   SARS Coronavirus 2 by RT PCR NEGATIVE NEGATIVE Final    Comment: (NOTE) SARS-CoV-2 target nucleic acids are NOT DETECTED.  The SARS-CoV-2 RNA is generally detectable in upper respiratoy specimens during the acute phase of infection. The lowest concentration of SARS-CoV-2 viral copies this assay can detect is 131 copies/mL. A negative result does not preclude SARS-Cov-2 infection and should not be used as the sole basis for treatment or other patient management decisions. A negative result may occur with  improper specimen collection/handling, submission of specimen other than nasopharyngeal swab, presence of viral mutation(s) within  the areas targeted by this assay, and inadequate number of viral copies (<131 copies/mL). A negative result must be combined with clinical observations, patient history, and epidemiological information. The expected result is Negative.  Fact Sheet for Patients:  PinkCheek.be  Fact Sheet for Healthcare Providers:  GravelBags.it  This test is no t yet approved or cleared by the [place] FDA and  has been authorized for detection and/or diagnosis of SARS-CoV-2 by FDA under an Emergency Use Authorization (EUA). This EUA will remain  in effect (meaning this test can be used) for the duration of the COVID-19 declaration under Section 564(b)(1) of the Act, 21 U.S.C. section 360bbb-3(b)(1), unless the authorization is terminated or revoked sooner.     Influenza A by PCR NEGATIVE NEGATIVE Final   Influenza B by PCR NEGATIVE NEGATIVE Final    Comment: (NOTE) The Xpert Xpress SARS-CoV-2/FLU/RSV assay is intended as an aid in  the diagnosis of influenza from Nasopharyngeal swab specimens and  should not be used as a sole basis for treatment. Nasal washings and  aspirates are unacceptable for Xpert Xpress SARS-CoV-2/FLU/RSV  testing.  Fact Sheet for Patients: PinkCheek.be  Fact Sheet for Healthcare Providers: GravelBags.it  This test is not yet approved or cleared by the [place] FDA and  has been authorized for detection and/or diagnosis of SARS-CoV-2 by  FDA under an Emergency Use Authorization (EUA). This EUA will remain  in effect (meaning this test can be used) for the duration of the  Covid-19 declaration under Section 564(b)(1) of the Act, 21  U.S.C. section 360bbb-3(b)(1), unless the authorization is  terminated or revoked.    Respiratory Syncytial Virus by PCR NEGATIVE NEGATIVE Final    Comment: (NOTE) Fact Sheet for  Patients: PinkCheek.be  Fact Sheet for Healthcare Providers: GravelBags.it  This test is not yet approved or cleared by the [place] FDA and  has been authorized for detection and/or diagnosis of SARS-CoV-2 by  FDA under an Emergency Use Authorization (EUA). This EUA will remain  in effect (meaning this test can be used) for the duration of the  COVID-19 declaration under Section 564(b)(1) of the Act, 21 U.S.C.  section 360bbb-3(b)(1), unless the authorization is terminated or  revoked. Performed at [facility], [place] [address] [place], [place] [address]   MRSA PCR Screening     Status: None   Collection Time: 11/28/19  4:13 AM   Specimen: Nasal Mucosa; Nasopharyngeal  Result Value Ref Range Status   MRSA by PCR NEGATIVE NEGATIVE Final  Comment:        The GeneXpert MRSA Assay (FDA approved for NASAL specimens only), is one component of a comprehensive MRSA colonization surveillance program. It is not intended to diagnose MRSA infection nor to guide or monitor treatment for MRSA infections. Performed at [facility], [place] [address], [place],  [address]          Radiology Studies: DG Chest Port 1 View  Result Date: 11/28/2019 CLINICAL DATA:  Dyspnea EXAM: PORTABLE CHEST 1 VIEW COMPARISON:  Prior chest x-ray 11/25/2019 FINDINGS: Stable chronic atelectasis of the right upper lobe resulting in hyperinflation of the right lower and right middle lobes. Similar appearance of mild patchy airspace opacities in the right lung base. No significant interval progression. Cardiac and mediastinal contours remain unchanged. Chronic bronchitic changes are unchanged. No pleural effusion or pneumothorax. No acute osseous abnormality. IMPRESSION: Stable chest x-ray without significant interval change compared to 11/25/2019. Electronically Signed   By: Jacqulynn Cadet M.D.   On: 11/28/2019 09:08         Scheduled Meds: .  stroke: mapping our early stages of recovery book   Does not apply Once  . acetylcysteine  4 mL Nebulization BID  . aspirin EC  81 mg Oral Daily  . atorvastatin  80 mg Oral Daily  . budesonide  0.5 mg Nebulization BID  . Chlorhexidine Gluconate Cloth  6 each Topical Daily  . enoxaparin (LOVENOX) injection  40 mg Subcutaneous Q24H  . feeding supplement  237 mL Oral TID BM  . guaiFENesin  600 mg Oral BID  . ipratropium-albuterol  3 mL Nebulization TID  . methylPREDNISolone (SOLU-MEDROL) injection  40 mg Intravenous Q8H  . multivitamin with minerals  1 tablet Oral Daily  . nicotine  14 mg Transdermal Daily  . pantoprazole  40 mg Oral BID  . tamsulosin  0.4 mg Oral QHS  . ticagrelor  30 mg Oral BID   Continuous Infusions: . ampicillin-sulbactam (UNASYN) IV 3 g (11/28/19 0919)     LOS: 7 days   Time spent= 35 mins    Airyanna Dipalma Arsenio Loader, MD [facility]  If 7PM-7AM, please contact night-coverage  11/28/2019, 11:25 AM
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
## 2019-11-28 NOTE — Progress Notes (Signed)
Pharmacy Antibiotic Note  ASCENCION COYE is a 69 y.o. male on day #4 Unasyn for aspiration pneumonia coverage.    Afebrile, WBC down 10.6.  Steroids added today. PCT 0.10.    Renal function stable.  Plan:  Continue Unasyn 3gm IV q8hrs.  Follow renal function, clinical progress, antibiotic plans.  Height: 5\' 5"  (165.1 cm) Weight: 48.1 kg (106 lb) IBW/kg (Calculated) : 61.5  Temp (24hrs), Avg:98.1 F (36.7 C), Min:97.6 F (36.4 C), Max:98.7 F (37.1 C)  Recent Labs  Lab 11/23/19 0225 11/25/19 0450 11/26/19 1021 11/27/19 0431 11/28/19 0413  WBC 11.9* 14.3* 14.0* 12.1* 10.6*  CREATININE 0.70 0.69 0.73 0.62 0.74    Estimated Creatinine Clearance: 60.1 mL/min (by C-G formula based on SCr of 0.74 mg/dL).    Allergies  Allergen Reactions   Chantix [Varenicline] Other (See Comments)    Weird dreams    Antimicrobials this admission:  Unasyn 10/24>>  Dose adjustments this admission:  n/a  Microbiology results:  10/24 sputum: appears no sample received  10/24 COVID and flu: negative  Thank you for allowing pharmacy to be a part of this patients care.  Arty Baumgartner, [place] Phone: [phone] 11/28/2019 1:10 PM

## 2019-11-28 NOTE — Progress Notes (Signed)
Mews score continues yellow, had IV antibotics   11/28/19 1700  Assess: MEWS Score  Temp 98.4 F (36.9 C)  BP 99/76  Pulse Rate (!) 108  ECG Heart Rate (!) 108  Level of Consciousness Alert  Assess: MEWS Score  MEWS Temp 0  MEWS Systolic 1  MEWS Pulse 1  MEWS RR 0  MEWS LOC 0  MEWS Score 2  MEWS Score Color Yellow  Assess: if the MEWS score is Yellow or Red  Were vital signs taken at a resting state? Yes  Focused Assessment No change from prior assessment  Early Detection of Sepsis Score *See Row Information* Low  MEWS guidelines implemented *See Row Information* Yes  Treat  MEWS Interventions Administered scheduled meds/treatments  Pain Scale 0-10  Pain Score 0  Patients response to intervention Effective  Take Vital Signs  Increase Vital Sign Frequency  Yellow: Q 2hr X 2 then Q 4hr X 2, if remains yellow, continue Q 4hrs  Escalate  MEWS: Escalate Yellow: discuss with charge nurse/RN and consider discussing with provider and RRT  Notify: Charge Nurse/RN  Name of Charge Nurse/RN Notified Lauryn RN  Date Charge Nurse/RN Notified 11/28/19  Time Charge Nurse/RN Notified 1750  Notify: Provider  Provider Name/Title MD Reesa Chew  Date Provider Notified 11/28/19  Time Provider Notified [phone]

## 2019-11-28 NOTE — Progress Notes (Signed)
   11/28/19 1123  Clinical Encounter Type  Visited With Patient  Visit Type Follow-up;Spiritual support  Referral From Chaplain (Ray)  Consult/Referral To [place] attempted to follow-up with Pt. Pt was unavailable so Chaplain will follow-up this afternoon.  This note was prepared by Chaplain Resident, Dante Gang, MDiv. For questions, please contact by phone at [phone].

## 2019-11-28 NOTE — Progress Notes (Addendum)
NIR.  Patient was tentatively scheduled for an image-guided cerebral arteriogram with possible revascularization (angioplasty, stent placement) of right common carotid artery stenosis in IR with Dr. Estanislado Pandy this AM.  Unfortunately, patient's breathing has worsened this AM, requiring rapid response secondary to acute oxygen desaturation (patient with bilateral rhonchi per notes). In addition, with new-onset hematuria. Case has been discussed between Dr. Estanislado Pandy, Dr. Reesa Chew [facility]), and anesthesia- at this time, recommend postponing procedure until patient's breathing has optimized and hematuria resolved. Ideally, tentatively plan for rescheduling procedure on inpatient basis during this admission.   Will switch patient to Brilinta 30 mg twice daily (remain on Aspirin 81 mg once daily)- will repeat P2Y12 Friday per Dr. Estanislado Pandy. Further plans per TRH/neurology- appreciate and agree with management. NIR to follow.  Spoke with patient's son, Wess Baney, via telephone at [phone] to discuss updates on patient's procedure. All questions answered and concerns addressed.   Bea Graff Kallista Pae, PA-C 11/28/2019, 10:56 AM

## 2019-11-29 DIAGNOSIS — I639 Cerebral infarction, unspecified: Secondary | ICD-10-CM | POA: Diagnosis not present

## 2019-11-29 DIAGNOSIS — I10 Essential (primary) hypertension: Secondary | ICD-10-CM | POA: Diagnosis not present

## 2019-11-29 LAB — COMPREHENSIVE METABOLIC PANEL
ALT: 32 U/L (ref 0–44)
AST: 30 U/L (ref 15–41)
Albumin: 2.1 g/dL — ABNORMAL LOW (ref 3.5–5.0)
Alkaline Phosphatase: 82 U/L (ref 38–126)
Anion gap: 9 (ref 5–15)
BUN: 16 mg/dL (ref 8–23)
CO2: 26 mmol/L (ref 22–32)
Calcium: 8.5 mg/dL — ABNORMAL LOW (ref 8.9–10.3)
Chloride: 104 mmol/L (ref 98–111)
Creatinine, Ser: 0.64 mg/dL (ref 0.61–1.24)
GFR, Estimated: >60 mL/min (ref >60)
Glucose, Bld: 178 mg/dL — ABNORMAL HIGH (ref 70–99)
Potassium: 4.2 mmol/L (ref 3.5–5.1)
Sodium: 139 mmol/L (ref 135–145)
Total Bilirubin: 0.4 mg/dL (ref 0.3–1.2)
Total Protein: 5.5 g/dL — ABNORMAL LOW (ref 6.5–8.1)

## 2019-11-29 MED ORDER — BUDESONIDE 0.25 MG/2ML IN SUSP
0.2500 mg | Freq: Two times a day (BID) | RESPIRATORY_TRACT | Status: DC
  Administered 2019-11-29 – 2019-12-13 (×28): 0.25 mg via RESPIRATORY_TRACT
  Filled 2019-11-29 (×29): qty 2
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
## 2019-11-29 NOTE — Progress Notes (Signed)
PROGRESS NOTE    Evan [name]  ZOX:[id number] DOB: 1950/10/16 DOA: 11/21/2019 PCP: Patient, No Pcp Per   Brief Narrative:  69 y.o. 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 Found to have a CVA: needs carotid stents.  Has other issues that will need outpatient follow up: hematuria and dysphagia/wet phonation.  Stent placement was on hold due to significant abnormal breath sounds.   Pulmonary and urology teams were consulted.   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  Pulmonary team consulted and adjusted bronchodilators. -Incentive spirometer and flutter valve -Continue Solu-Medrol -History of dysphagia-previous provider discussed with ENT-recommending Mucomyst.  Acute nonhemorrhagic CVA, left temporal occipital region -LDL 154, A1c 6.3.  Echocardiogram EF 55 to 60%.  CTA head and neck did not show LVO but showed severe bilateral CAD stenosis therefore IR consulted for stent placement. -Currently on Brilinta and aspirin -Stent placement  currently on hold  Essential hypertension -Permissive hypertension  Hematuria -Suspect from traumatic Foley placement with history of BPH.  Seen by urology, recommend intermittent flushing if necessary.  Foley catheter changed on 11/28/2019  DVT prophylaxis: enoxaparin (LOVENOX) injection 40 mg Start: 11/22/19 1000  Code Status: Full Code Family Communication: Spoke with the patient's daughter and son  Status is: Inpatient  Remains inpatient appropriate because:Hemodynamically unstable   Dispo: The patient is from: SNF              Anticipated d/c is to: SNF              Anticipated d/c date is: > 3 days              Patient currently is not medically stable to d/c.  Still significant abnormal breath sounds awaiting stent placement procedure.    Body mass index is 17.64 kg/m.     Subjective:   Review of Systems Otherwise negative except as per HPI, including: General = no fevers, chills, dizziness,  fatigue HEENT/EYES = negative for loss of vision, double vision, blurred vision,  sore throa Cardiovascular= negative for chest pain, palpitation Respiratory/lungs= negative for  hemoptysis,  Gastrointestinal= negative for nausea, vomiting, abdominal pain Genitourinary= negative for Dysuria MSK = Negative for arthralgia, myalgias Neurology= Negative for headache, numbness, tingling  Psychiatry= Negative for suicidal and homocidal ideation Skin= Negative for Rash  Examination: Constitutional: Not in acute distress, bilateral temporal wasting, 2 L nasal cannula.  Appears chronically ill Respiratory: Diffuse rhonchi Cardiovascular: Normal sinus rhythm, no rubs Abdomen: Nontender nondistended good bowel sounds Musculoskeletal: No edema noted Skin: No rashes seen Neurologic: CN 2-12 grossly intact.  And nonfocal Psychiatric: Normal  judgment and insight. Alert and oriented x 3. Normal mood.    Objective: Vitals:   11/28/19 2349 11/29/19 0412 11/29/19 0722 11/29/19 0833   BP: (!) 136/98 126/84    Pulse: (!) 117 (!) 112    Resp: 20 20    Temp: 97.8 F (36.6 C) (!) 97.5 F (36.4 C)  (P) 98 F (36.7 C)  TempSrc:    (P) Oral  SpO2: 99% 100% 96%   Weight:      Height:        Intake/Output Summary (Last 24 hours) at 11/29/2019 1042 Last data filed at 11/28/2019 2011 Gross per 24 hour  Intake --  Output 350 ml  Net -350 ml   Filed Weights   11/21/19 1819  Weight: 48.1 kg     Data Reviewed:   CBC: Recent Labs  Lab 11/23/19 0225 11/25/19 0450 11/26/19 1021 11/27/19 0431 11/28/19 0413  WBC 11.9* 14.3* 14.0* 12.1* 10.6*  NEUTROABS 10.7*  --   --   --   --   HGB 10.8* 11.7* 11.3* 10.1* 8.7*  HCT 33.6* 36.0* 34.7* 31.3* 26.4*  MCV 83.2 82.6 84.0 83.2 83.5  PLT 425* 490* 508* 440* 656*   Basic Metabolic Panel: Recent Labs  Lab 11/25/19 0450 11/26/19 1021 11/27/19 0431 11/28/19 0413 11/29/19 0334  NA 134* 135 136 138 139  K 3.9 3.6 3.5 3.8 4.2  CL 100 100 102 103 104  CO2 22 24 25 26 26   GLUCOSE 110* 112* 135* 119* 178*  BUN 11 9 10 11 16   CREATININE 0.69 0.73 0.62 0.74 0.64  CALCIUM 8.7* 8.8* 8.4* 8.6* 8.5*   GFR: Estimated Creatinine Clearance: 60.1 mL/min (by C-G formula based on SCr of 0.64 mg/dL). Liver Function Tests: Recent Labs  Lab 11/28/19 0413 11/29/19 0334  AST 28 30  ALT 28 32  ALKPHOS 78 82  BILITOT 0.4 0.4  PROT 5.3* 5.5*  ALBUMIN 2.1* 2.1*   No results for input(s): LIPASE, AMYLASE in the last 168 hours. No results for input(s): AMMONIA in the last 168 hours. Coagulation Profile: Recent Labs  Lab 11/28/19 0413  INR 1.1   Cardiac Enzymes: No results for input(s): CKTOTAL, CKMB, CKMBINDEX, TROPONINI in the last 168 hours. BNP (last 3 results) No results for input(s): PROBNP in the last 8760 hours. HbA1C: No results for input(s): HGBA1C in the last 72 hours. CBG: Recent Labs  Lab 11/22/19 1052  GLUCAP 92   Lipid Profile: No results for input(s): CHOL, HDL, LDLCALC, TRIG, CHOLHDL, LDLDIRECT  in the last 72 hours. Thyroid Function Tests: Recent Labs    11/27/19 0431  FREET4 1.03   Anemia Panel: No results for input(s): VITAMINB12, FOLATE, FERRITIN, TIBC, IRON, RETICCTPCT in the last 72 hours. Sepsis Labs: Recent Labs  Lab 11/28/19 1016  PROCALCITON 0.10    Recent Results (from the past 240 hour(s))  Resp Panel by RT PCR (RSV, Flu A&B, Covid) - Nasopharyngeal Swab     Status: None   Collection Time: 11/21/19  7:08 PM   Specimen: Nasopharyngeal Swab  Result Value Ref Range Status   SARS Coronavirus 2 by RT PCR NEGATIVE NEGATIVE Final    Comment: (NOTE) SARS-CoV-2 target nucleic acids are NOT DETECTED.  The SARS-CoV-2 RNA is generally detectable in upper respiratoy specimens during the acute phase of infection. The lowest concentration of SARS-CoV-2 viral copies this assay can detect is 131 copies/mL. A negative result does not preclude SARS-Cov-2 infection and should not be used as the sole  basis for treatment or other patient management decisions. A negative result may occur with  improper specimen collection/handling, submission of specimen other than nasopharyngeal swab, presence of viral mutation(s) within the areas targeted by this assay, and inadequate number of viral copies (<131 copies/mL). A negative result must be combined with clinical observations, patient history, and epidemiological information. The expected result is Negative.  Fact Sheet for Patients:  PinkCheek.be  Fact Sheet for Healthcare Providers:  GravelBags.it  This test is no t yet approved or cleared by the [place] FDA and  has been authorized for detection and/or diagnosis of SARS-CoV-2 by FDA under an Emergency Use Authorization (EUA). This EUA will remain  in effect (meaning this test can be used) for the duration of the COVID-19 declaration under Section 564(b)(1) of the Act, 21 U.S.C. section 360bbb-3(b)(1), unless  the authorization is terminated or revoked sooner.     Influenza A by PCR NEGATIVE NEGATIVE Final   Influenza B by PCR NEGATIVE NEGATIVE Final    Comment: (NOTE) The Xpert Xpress SARS-CoV-2/FLU/RSV assay is intended as an aid in  the diagnosis of influenza from Nasopharyngeal swab specimens and  should not be used as a sole basis for treatment. Nasal washings and  aspirates are unacceptable for Xpert Xpress SARS-CoV-2/FLU/RSV  testing.  Fact Sheet for Patients: PinkCheek.be  Fact Sheet for Healthcare Providers: GravelBags.it  This test is not yet approved or cleared by the [place] FDA and  has been authorized for detection and/or diagnosis of SARS-CoV-2 by  FDA under an Emergency Use Authorization (EUA). This EUA will remain  in effect (meaning this test can be used) for the duration of the  Covid-19 declaration under Section 564(b)(1) of the Act, 21  U.S.C. section 360bbb-3(b)(1), unless the authorization is  terminated or revoked.    Respiratory Syncytial Virus by PCR NEGATIVE NEGATIVE Final    Comment: (NOTE) Fact Sheet for Patients: PinkCheek.be  Fact Sheet for Healthcare Providers: GravelBags.it  This test is not yet approved or cleared by the [place] FDA and  has been authorized for detection and/or diagnosis of SARS-CoV-2 by  FDA under an Emergency Use Authorization (EUA). This EUA will remain  in effect (meaning this test can be used) for the duration of the  COVID-19 declaration under Section 564(b)(1) of the Act, 21 U.S.C.  section 360bbb-3(b)(1), unless the authorization is terminated or  revoked. Performed at [facility], [place] [address] [place], [place] [address]   MRSA PCR Screening     Status: None   Collection Time: 11/28/19  4:13 AM   Specimen: Nasal Mucosa; Nasopharyngeal  Result Value Ref Range Status    MRSA by PCR NEGATIVE NEGATIVE Final    Comment:        The GeneXpert MRSA Assay (FDA approved for NASAL specimens only), is one component of a comprehensive MRSA colonization surveillance program. It is not intended to diagnose MRSA infection nor to guide or monitor treatment for MRSA infections. Performed at [facility], [place] [address] [place], [place] [address]          Radiology Studies: DG Chest Port 1 View  Result Date: 11/28/2019 CLINICAL DATA:  Dyspnea EXAM: PORTABLE CHEST 1 VIEW COMPARISON:  Prior chest x-ray 11/25/2019 FINDINGS: Stable chronic atelectasis of the right upper lobe resulting in hyperinflation of the right lower and right middle lobes. Similar appearance of mild patchy airspace opacities in the right lung base. No significant interval progression. Cardiac and mediastinal contours remain  unchanged. Chronic bronchitic changes are unchanged. No pleural effusion or pneumothorax. No acute osseous abnormality. IMPRESSION: Stable chest x-ray without significant interval change compared to 11/25/2019. Electronically Signed   By: Jacqulynn Cadet M.D.   On: 11/28/2019 09:08        Scheduled Meds: .  stroke: mapping our early stages of recovery book   Does not apply Once  . acetylcysteine  4 mL Nebulization BID  . arformoterol  15 mcg Nebulization BID  . aspirin EC  81 mg Oral Daily  . atorvastatin  80 mg Oral Daily  . Chlorhexidine Gluconate Cloth  6 each Topical Daily  . enoxaparin (LOVENOX) injection  40 mg Subcutaneous Q24H  . feeding supplement  237 mL Oral TID BM  . guaiFENesin  600 mg Oral BID  . methylPREDNISolone (SOLU-MEDROL) injection  40 mg Intravenous Q8H  . multivitamin with minerals  1 tablet Oral Daily  . nicotine  14 mg Transdermal Daily  . pantoprazole  40 mg Oral BID  . revefenacin  175 mcg Nebulization Daily  . tamsulosin  0.4 mg Oral QHS  . ticagrelor  30 mg Oral BID   Continuous Infusions: . ampicillin-sulbactam (UNASYN) IV 3  g (11/29/19 0830)     LOS: 8 days   Time spent= 35 mins    Aubreigh Fuerte Arsenio Loader, MD [facility]  If 7PM-7AM, please contact night-coverage  11/29/2019, 10:42 AM

## 2019-11-29 NOTE — Plan of Care (Signed)
  Problem: Health Behavior/Discharge Planning: Goal: Ability to manage health-related needs will improve Outcome: Progressing   Problem: Coping: Goal: Will verbalize positive feelings about self Outcome: Progressing   Problem: Education: Goal: Knowledge of disease or condition will improve Outcome: Progressing

## 2019-11-29 NOTE — Progress Notes (Signed)
Referring Physician(s): Rosalin Hawking  Supervising Physician: Luanne Bras  Patient Status:  [facility] - In-pt  Chief Complaint:  F/U CVA  Brief History:  History of acute CVA (left temporooccipital infarct and R>L watershed infarcts) s/p diagnostic cerebral arteriogram 11/23/2019 by Dr. Estanislado Pandy revealing bilateral common carotid artery stenosis- thought to be cause of recent CVA (left-sided weakness- symptomatic from right CCA stenosis).  Plan was for carotid stent yesterday however patients respiratory status was much worse.  Subjective:  Patient up in chair. Continues to have coarse rhonchi with every breath.  Allergies: Chantix [varenicline]  Medications: Prior to Admission medications   Medication Sig Start Date End Date Taking? Authorizing Provider  acetaminophen (TYLENOL) 500 MG tablet Take 500 mg by mouth every 6 (six) hours as needed for mild pain, fever or headache.    Yes [provider]  albuterol (VENTOLIN HFA) 108 (90 Base) MCG/ACT inhaler TAKE 2 PUFFS BY MOUTH EVERY 6 HOURS AS NEEDED FOR WHEEZE OR SHORTNESS OF BREATH Patient taking differently: Inhale 2 puffs into the lungs every 6 (six) hours as needed for wheezing or shortness of breath.  08/30/19  Yes Rigoberto Noel, MD  benzonatate (TESSALON) 100 MG capsule Take 1 capsule (100 mg total) by mouth every 8 (eight) hours. 11/09/19  Yes Dorie Rank, MD  Glycopyrrolate-Formoterol (BEVESPI AEROSPHERE) 9-4.8 MCG/ACT AERO Inhale 2 puffs into the lungs 2 (two) times daily. 06/26/19  Yes Rigoberto Noel, MD  guaiFENesin (MUCINEX) 600 MG 12 hr tablet Take 600 mg by mouth 2 (two) times daily as needed for cough.   Yes [provider]  guaiFENesin (ROBITUSSIN) 100 MG/5ML liquid Take 200 mg by mouth 3 (three) times daily as needed for cough.   Yes [provider]  nicotine (NICODERM CQ - DOSED IN MG/24 HOURS) 14 mg/24hr patch Place 1 patch (14 mg total) onto the skin daily. For 6 weeks.  Then decrease  to 7 mg daily. 07/25/19  Yes Yopp, Amber C, RPH-CPP  tamsulosin (FLOMAX) 0.4 MG CAPS capsule Take 0.4 mg by mouth at bedtime. 10/10/19  Yes [provider]  budesonide (PULMICORT) 0.5 MG/2ML nebulizer solution Take 2 mLs (0.5 mg total) by nebulization 2 (two) times daily. 11/21/19   Martyn Ehrich, NP  [facility] ASPIRIN EC 325 MG EC tablet TAKE 1 TABLET BY MOUTH EVERY DAY Patient taking differently: Take 325 mg by mouth daily.  08/16/19   Vaslow, Acey Lav, MD  ipratropium-albuterol (DUONEB) 0.5-2.5 (3) MG/3ML SOLN Take 3 mLs by nebulization every 6 (six) hours as needed. Patient taking differently: Take 3 mLs by nebulization every 6 (six) hours as needed (sob).  11/21/19   Martyn Ehrich, NP  nicotine polacrilex (NICORETTE) 4 MG gum Take 1 each (4 mg total) by mouth as needed for smoking cessation. Patient not taking: Reported on 11/15/2019 07/25/19   Yopp, [employer] C, RPH-CPP     Vital Signs: BP 126/84 (BP Location: Left Arm)   Pulse (!) 112   Temp (P) 98 F (36.7 C) (Oral)   Resp 20   Ht 5\' 5"  (1.651 m)   Wt 48.1 kg   SpO2 96%   BMI 17.64 kg/m   Physical Exam Constitutional:      Appearance: Normal appearance.  HENT:     Head: Normocephalic and atraumatic.  Cardiovascular:     Rate and Rhythm: Normal rate.  Pulmonary:     Breath sounds: Wheezing and rhonchi present.  Abdominal:     Palpations: Abdomen is soft.  Tenderness: There is no abdominal tenderness.  Skin:    General: Skin is warm and dry.  Neurological:     General: No focal deficit present.     Mental Status: He is alert and oriented to person, place, and time.  Psychiatric:        Mood and Affect: Mood normal.        Behavior: Behavior normal.        Thought Content: Thought content normal.        Judgment: Judgment normal.     Imaging: DG Chest Port 1 View  Result Date: 11/28/2019 CLINICAL DATA:  Dyspnea EXAM: PORTABLE CHEST 1 VIEW COMPARISON:  Prior chest x-ray 11/25/2019 FINDINGS: Stable  chronic atelectasis of the right upper lobe resulting in hyperinflation of the right lower and right middle lobes. Similar appearance of mild patchy airspace opacities in the right lung base. No significant interval progression. Cardiac and mediastinal contours remain unchanged. Chronic bronchitic changes are unchanged. No pleural effusion or pneumothorax. No acute osseous abnormality. IMPRESSION: Stable chest x-ray without significant interval change compared to 11/25/2019. Electronically Signed   By: Jacqulynn Cadet M.D.   On: 11/28/2019 09:08    Labs:  CBC: Recent Labs    11/25/19 0450 11/26/19 1021 11/27/19 0431 11/28/19 0413  WBC 14.3* 14.0* 12.1* 10.6*  HGB 11.7* 11.3* 10.1* 8.7*  HCT 36.0* 34.7* 31.3* 26.4*  PLT 490* 508* 440* 424*    COAGS: Recent Labs    11/21/19 2158 11/28/19 0413  INR 1.2 1.1  APTT 28  --     BMP: Recent Labs    10/28/19 1935 10/28/19 1935 10/29/19 0401 10/29/19 0401 10/30/19 0542 10/30/19 0542 10/31/19 0523 11/09/19 2136 11/26/19 1021 11/27/19 0431 11/28/19 0413 11/29/19 0334  NA 136   < > 137   < > 132*   < > 135   < > 135 136 138 139  K 4.2   < > 4.4   < > 4.4   < > 4.8   < > 3.6 3.5 3.8 4.2  CL 100   < > 102   < > 99   < > 99   < > 100 102 103 104  CO2 25   < > 24   < > 25   < > 25   < > 24 25 26 26   GLUCOSE 90   < > 96   < > 150*   < > 141*   < > 112* 135* 119* 178*  BUN 10   < > 9   < > 11   < > 13   < > 9 10 11 16   CALCIUM 8.7*   < > 8.8*   < > 8.8*   < > 8.7*   < > 8.8* 8.4* 8.6* 8.5*  CREATININE 0.70   < > 0.81   < > 0.68   < > 0.64   < > 0.73 0.62 0.74 0.64  GFRNONAA >60   < > >60   < > >60   < > >60   < > >60 >60 >60 >60  GFRAA >60  --  >60  --  >60  --  >60  --   --   --   --   --    < > = values in this interval not displayed.    LIVER FUNCTION TESTS: Recent Labs    11/15/19 1243 11/21/19 1855 11/28/19 0413 11/29/19 0334  BILITOT 0.4 <0.1* 0.4 0.4  AST 21 71* 28 30  ALT 26 94* 28 32  ALKPHOS 86 89 78 82  PROT  7.2 6.3* 5.3* 5.5*  ALBUMIN 3.4* 2.9* 2.1* 2.1*    Assessment and Plan:  Bilateral carotid stenosis.  Patient still with no improvement in respiratory status.  Hopefully Mr. Cadavid medical status will be improved by early next week so we can proceed with Carotid Stent.  Will continue to follow status.  Electronically Signed: Murrell Redden, PA-C 11/29/2019, 12:11 PM    I spent a total of 15 Minutes at the the patient's bedside AND on the patient's hospital floor or unit, greater than 50% of which was counseling/coordinating care for f/u carotid stenosis.

## 2019-11-29 NOTE — Progress Notes (Signed)
Nutrition Follow-up  DOCUMENTATION CODES:   Severe malnutrition in context of chronic illness  INTERVENTION:  -Continue Ensure Enlive po TID, each supplement provides 350 kcal and 20 grams of protein -Continue MVI daily  Given pt's poor po intake, recommend placement of NGT/Cortrak to aid in meeting pt's nutrition needs if aligned with GOC. Consider: Osmolite 1.2 cal @ 42m/hr with 469mProstat TF daily. Provides 1768 kcals, 90 grams protein, 118012mree water  NUTRITION DIAGNOSIS:   Severe Malnutrition related to chronic illness, cancer and cancer related treatments as evidenced by severe muscle depletion, severe fat depletion.  ongoing  GOAL:   Patient will meet greater than or equal to 90% of their needs  Not met  MONITOR:   PO intake, Supplement acceptance, Labs, Weight trends, I & O's, Diet advancement  REASON FOR ASSESSMENT:   Malnutrition Screening Tool    ASSESSMENT:   Pt admitted with CVA. PMH includes lung Ca stage III s/p chemo now in remission, recent hospitalization with recuurent PNA, COPD, depression, L renal mass, anxiety disorder, traumatic R BKA.  10/22 cerebral angiogram  Pt unavailable at time of RD visit.   Pt with recurrent pneumonias and increased upper airway secretions with concerns for aspiration. Pt was scheduled to go to IR yesterday for carotid stent placements, but procedure was delayed due to respiratory status.   No PO intake documented, but staff informed this RD that pt's intake has been poor. Recommend placement of Cortrak/NGT to supplement pt's po intake if aligned with GOC. CCM in agreement. Of note, pt is receiving Ensure Enlive TID which he is typically consuming, though this is not sufficient to meet his nutritional needs.   UOP: 350m44mcumented today  Labs reviewed. Medications: Solu-medrol, MVI, Protonix  Diet Order:   Diet Order            Diet NPO time specified Except for: Sips with Meds  Diet effective midnight                  EDUCATION NEEDS:   Education needs have been addressed  Skin:  Skin Assessment: Reviewed RN Assessment  Last BM:  10/27  Height:   Ht Readings from Last 1 Encounters:  11/21/19 _0  (1.651 m)    Weight:   Wt Readings from Last 1 Encounters:  11/21/19 48.1 kg    BMI:  Body mass index is 17.64 kg/m.  Estimated Nutritional Needs:   Kcal:  1700-1900  Protein:  85-100 grams  Fluid:  >/=1.7L/d    AmanLarkin Ina, RD, LDN RD pager number and weekend/on-call pager number located in [place]

## 2019-11-29 NOTE — Progress Notes (Signed)
Noted patient /s foley output is light pink in color ,  few clots of blood noted  in foleyCatheter when  Flushed and irrigated;

## 2019-11-29 NOTE — Progress Notes (Signed)
STROKE TEAM PROGRESS NOTE   INTERVAL HISTORY Patient is sitting up in bed.  He continues to have trouble clearing his secretions and coughing.  Continues to have hematuria though it seems to have slowed down.  His son is at the bedside.  Vitals:   11/29/19 0412 11/29/19 0722 11/29/19 0833 11/29/19 1226  BP: 126/84   120/90  Pulse: (!) 112   (!) 115  Resp: 20   18  Temp: (!) 97.5 F (36.4 C)  98 F (36.7 C) 97.8 F (36.6 C)  TempSrc:   Oral Oral  SpO2: 100% 96%  99%  Weight:      Height:       CBC:  Recent Labs  Lab 11/23/19 0225 11/25/19 0450 11/27/19 0431 11/28/19 0413  WBC 11.9*   < > 12.1* 10.6*  NEUTROABS 10.7*  --   --   --   HGB 10.8*   < > 10.1* 8.7*  HCT 33.6*   < > 31.3* 26.4*  MCV 83.2   < > 83.2 83.5  PLT 425*   < > 440* 424*   < > = values in this interval not displayed.   Basic Metabolic Panel:  Recent Labs  Lab 11/28/19 0413 11/29/19 0334  NA 138 139  K 3.8 4.2  CL 103 104  CO2 26 26  GLUCOSE 119* 178*  BUN 11 16  CREATININE 0.74 0.64  CALCIUM 8.6* 8.5*    IMAGING past 24 hours No results found.  PHYSICAL EXAM       Temp:  [97.5 F (36.4 C)-98.4 F (36.9 C)] 97.8 F (36.6 C) (10/28 1226) Pulse Rate:  [108-117] 115 (10/28 1226) Resp:  [18-20] 18 (10/28 1226) BP: (99-136)/(76-98) 120/90 (10/28 1226) SpO2:  [96 %-100 %] 99 % (10/28 1226) FiO2 (%):  [28 %] 28 % (10/28 0722)  General -frail cachectic malnourished looking elderly male in no apparent distress. He has right below-knee amputation.  Ophthalmologic - fundi not visualized due to noncooperation.  Cardiovascular - Regular rhythm and rate.  Respiratory - b/l Rhonchi, secretion in throat with weak cough.  Neuro stable - awake alert, oriented x3.  No aphasia, hypophonic speech with mild dysarthria weak cough, able to name and repeat.  Follows commands.  No gaze preference.  Mild left facial droop, tongue midline.  Left upper extremity 3+/5 deltoid, 3/5 bicep, 3/5 tricep and finger  grip. Significant weakness of left hand intrinsic hand muscles. Right upper extremity at least 4/5.  Bilateral lower extremity at least 4/5 with right BKA.  Sensation symmetrical.  Finger-to-nose intact on the right.  Gait not tested.   ASSESSMENT/PLAN Mr. JAYTON POPELKA is a 69 y.o. male with history of recurrent non small cell lung cancer (stage IIIa status post chemo and radiation), traumatic right knee BKA, recent recurrent pneumonia, left renal mass, known prior bilateral carotid artery stenosis, COPD w/ recent exacerbation d/c 10/19. He has had generalized weakness and poor sleep, chronic swallowing, wt loss with poor apptetite and his son and dtr recently moved in to help him. Plans for prostate bx for which he had stopped his aspirin. On 10/20 presented to [facility] ED with difficulty w/ spatial awareness and vision guided movements, HA, L sided weakness. Transfered to [employer]. [facility].  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 Additional scattered cortical + subcortical  B watershed infarcts. Min petechial hemorrhage in L temporooccipital infarct. Old tiny L cerebellar infarct  CTA head & neck no LVO. 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 suspicion for L IJ thrombosis. B ICA siphon w/ mild stenosis. LLL opacity.   MRA head  Unremarkable   Cerebral angio confirmed bilateral CCA high-grade stenosis  2D Echo EF 55 to 60%  LDL 154   HgbA1c 6.3  VTE prophylaxis - Lovenox 40 mg sq daily   off aspirin for planned prostate bx prior to admission, now on aspirin 81 mg daily and brilinta 30 bid    Therapy recommendations:  SNF  Disposition:  pending   Palliative care involved - for family conference for goals of care  Recurrent PNA and Respiratory Distress / Agitation with concern for  aspiration COPD  Increased WOB 10/21 evening  Put on BIPAP w/ relief  Changed level to progressive care   Treated w/ ativan and haldol. Good relief w/ haldol  On mucomyst w/ improvement  Stent canceled d/t respiratory distress  Respiratory status improved today  Brilinta can increase dyspnea     For OP follow up   Carotid Stenosis   CTA neck 02/2017 Long segment of severe 80% right and severe 90% left proximal  common carotid artery stenosis.  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  Cerebral angio confirmed bilateral CCA high-grade stenosis  R CCA stenting cancelled for 10/27 Wednesday   Now on ASA and brilinta 30 bid  L parietooccipital lesion  MRI w/ severely degraded, enhancement L temporooccipital lobe, repeat recommended  Likely due to infarction  MRI with contrast nondiagnostic - motion due to SOB lying flat  L IJ thrombosis  No prior line placement in neck per family, history of for Port-A-Cath on right chest per patient  CTA neck in 2019 also suspicious for left IJ thrombosis, however not for certain - discussed with Dr. 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 Avoid low BP . No apparent CHF history . On Flomax  . Long-term BP goal 130-150 given carotid stenosis.   Hyperlipidemia  Home meds:  No statin  Now on lipitor 80  LDL 154, goal < 70  Continue statin at  discharge  Hematuria with urinary retention Enlarged Prostate  Foley  On Brilinta 30 bid w/ aspirin 81  HGB 11.3->10.1->8.7  Glucocorticoids can potentiate Brilinta     OP follow up planned  Stage IIIa Non Small cell lung CA  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   recurrence on the left side presented as a stage IIIa non-small cell lung cancer, squamous cell carcinoma status post concurrent chemoradiation followed by consolidation chemotherapy. Completed in March 2017.  He also underwent SBRT to the right middle lobe lung nodule under the care of  Dr. [name] completed on 07/24/2018.  He also underwent SBRT to left lower lobe lung nodule under the care of Dr. [name] completed on April 27, 2019.  Recent scan showed no concerning findings for disease progression  Followed with Dr. Julien Nordmann  Other Stroke Risk Factors  Advanced age  Cigarette smoker, quit 7 mos ago, advised to stop smoking  Other Active Problems  Recent COPD exacerbation, d/c 10/19. Nebs, pulmonary toilet for respiratory distress. On mucomyst  Recent recurrent PNA.   Marked enlargement of the Prostate w/ plans for bx    FTT. Cachexia, Body mass index is 17.64 kg/m.   Traumatic right BKA  Tachycardia - low grade  Leukocytosis 10.6  TSH high, free T4 normal  Hospital day # 8 Patient has remained neurologically stable.  He will benefit with elective carotid revascularization but after medical stability.  Stroke team will sign off.  Kindly call for questions. Antony Contras, MD  To contact Stroke Continuity provider, please refer to [email or web address] After hours, contact General Neurology

## 2019-11-29 NOTE — Progress Notes (Signed)
Small clots x 1 4 am Pink with irrigate this am Follow Urology- flush cath prn

## 2019-11-30 ENCOUNTER — Inpatient Hospital Stay (HOSPITAL_COMMUNITY): Payer: Medicare PPO

## 2019-11-30 DIAGNOSIS — I10 Essential (primary) hypertension: Secondary | ICD-10-CM | POA: Diagnosis not present

## 2019-11-30 LAB — COMPREHENSIVE METABOLIC PANEL
ALT: 34 U/L (ref 0–44)
AST: 28 U/L (ref 15–41)
Albumin: 2.2 g/dL — ABNORMAL LOW (ref 3.5–5.0)
Alkaline Phosphatase: 77 U/L (ref 38–126)
Anion gap: 9 (ref 5–15)
BUN: 14 mg/dL (ref 8–23)
CO2: 26 mmol/L (ref 22–32)
Calcium: 8.6 mg/dL — ABNORMAL LOW (ref 8.9–10.3)
Chloride: 106 mmol/L (ref 98–111)
Creatinine, Ser: 0.66 mg/dL (ref 0.61–1.24)
GFR, Estimated: >60 mL/min (ref >60)
Glucose, Bld: 147 mg/dL — ABNORMAL HIGH (ref 70–99)
Potassium: 4.2 mmol/L (ref 3.5–5.1)
Sodium: 141 mmol/L (ref 135–145)
Total Bilirubin: 0.4 mg/dL (ref 0.3–1.2)
Total Protein: 5.2 g/dL — ABNORMAL LOW (ref 6.5–8.1)

## 2019-11-30 LAB — GLUCOSE, CAPILLARY
Glucose-Capillary: 142 mg/dL — ABNORMAL HIGH (ref 70–99)
Glucose-Capillary: 164 mg/dL — ABNORMAL HIGH (ref 70–99)
Glucose-Capillary: 158 mg/dL — ABNORMAL HIGH (ref 70–99)

## 2019-11-30 LAB — PHOSPHORUS
Phosphorus: 3.6 mg/dL (ref 2.5–4.6)
Phosphorus: 2.9 mg/dL (ref 2.5–4.6)

## 2019-11-30 LAB — MAGNESIUM
Magnesium: 2.2 mg/dL (ref 1.7–2.4)
Magnesium: 2.2 mg/dL (ref 1.7–2.4)

## 2019-11-30 LAB — PLATELET INHIBITION P2Y12: Platelet Function  P2Y12: 35 [PRU] — ABNORMAL LOW (ref 182–335)

## 2019-11-30 MED ORDER — OSMOLITE 1.2 CAL PO LIQD
1000.0000 mL | ORAL | Status: DC
  Administered 2019-11-30 – 2019-12-05 (×5): 1000 mL
  Filled 2019-11-30 (×3): qty 1000

## 2019-11-30 MED ORDER — FREE WATER
100.0000 mL | Status: DC
  Administered 2019-11-30 – 2019-12-04 (×19): 100 mL

## 2019-11-30 MED ORDER — PROSOURCE TF PO LIQD
45.0000 mL | Freq: Every day | ORAL | Status: DC
  Administered 2019-11-30 – 2019-12-11 (×7): 45 mL
  Filled 2019-11-30 (×10): qty 45

## 2019-11-30 MED ORDER — LIDOCAINE VISCOUS HCL 2 % MT SOLN
10.0000 mL | Freq: Once | OROMUCOSAL | Status: AC
  Administered 2019-11-30: 10 mL via OROMUCOSAL
  Filled 2019-11-30: qty 15

## 2019-11-30 MED ORDER — IOHEXOL 300 MG/ML  SOLN
20.0000 mL | Freq: Once | INTRAMUSCULAR | Status: AC | PRN
  Administered 2019-11-30: 20 mL

## 2019-11-30 NOTE — Progress Notes (Signed)
Patient had CHG bath prior to IR for NG tube placement

## 2019-11-30 NOTE — Progress Notes (Addendum)
Cortrak Tube Team Note:  Multiple RDs attempted to pass tube into stomach without success. Monitor picture concerning for large hernia. MD and RN aware. Plan for patient to have tube placed in diagnostic radiology. Cortrak available to secure with bridle once tube is placed.   ADDENDUM: Cortrak secured radiology tube with bridle in L nare at 97 cm marking.   Mariana Single RD, LDN Clinical Nutrition Pager listed in [place]

## 2019-11-30 NOTE — Progress Notes (Signed)
PT presented with increased RR and Ronchi breath sounds. NT sx with small amount of white secretions. Notified Amin, Jeanella Flattery, MD Said to continue monitoring Pt. No new orders.

## 2019-11-30 NOTE — Progress Notes (Signed)
Occupational Therapy Treatment Patient Details Name: GIANNIS CORPUZ MRN: [id number] DOB: 12/18/1950 Today's Date: 11/30/2019    History of present illness 69 y.o. male with medical history significant of lung cancer apparently stage IIIa status post chemo now in remission, recent hospitalization with recurrent pneumonia, COPD, depression, left renal mass, anxiety disorder, and traumatic R BKA, presents with confusion, weakness, and mild aphasia. CT and MRI demonstrate new left parieto-occipital infarct. On 11/22/2019 pt developing agitation and respiratory distress requiring BiPAP and haldol. Pt underwent bilateral cerebral arteriogram on 11/23/2019 demonstrating severe bilateral common carotid artery stenosis at proximal/mid levels.   OT comments  Patient continues to make progress towards goals in skilled OT session. Patient's session encompassed neuromuscular re-ed for LUE and LLE due to increased lethargy due to medication (pt given atavan earlier after failed attempt to place [employer] with increased agitation). Pt is participatory in therapy, but required increased time and multimodal cues in order to complete. Provided education to son and pt with regard to bed level exercises to complete with verbal agreement noted. Discharge remains appropriate, will continue to follow acutely.    Follow Up Recommendations  SNF;Supervision/Assistance - 24 hour    Equipment Recommendations  3 in 1 bedside commode    Recommendations for Other Services      Precautions / Restrictions Precautions Precautions: Fall Precaution Comments: Rt BKA (prosthesis in room) Restrictions Weight Bearing Restrictions: No       Mobility Bed Mobility                  Transfers                      Balance                                           ADL either performed or assessed with clinical judgement   ADL Overall ADL's : Needs assistance/impaired                                      Functional mobility during ADLs: Maximal assistance;+2 for physical assistance;+2 for safety/equipment General ADL Comments: Session focus on neuromuscular re-ed     Vision       Perception     Praxis      Cognition Arousal/Alertness: Awake/alert Behavior During Therapy: WFL for tasks assessed/performed;Flat affect Overall Cognitive Status: Impaired/Different from baseline Area of Impairment: Following commands;Safety/judgement;Awareness;Problem solving                   Current Attention Level: Sustained   Following Commands: Follows one step commands with increased time Safety/Judgement: Decreased awareness of safety;Decreased awareness of deficits Awareness: Emergent Problem Solving: Difficulty sequencing;Requires verbal cues;Requires tactile cues General Comments: requires cues to stay on task as well as increased time for problem solving        Exercises General Exercises - Upper Extremity Shoulder Flexion: AAROM;Left;10 reps Shoulder Extension: AAROM;Left;10 reps Elbow Flexion: AAROM;Left;10 reps Elbow Extension: AAROM;Left;Strengthening;10 reps Digit Composite Flexion: AAROM;Left;10 reps Composite Extension: AAROM;Left;10 reps General Exercises - Lower Extremity Quad Sets: AROM;Left;10 reps Gluteal Sets: AROM;Left;10 reps Straight Leg Raises: AROM;10 reps;Left   Shoulder Instructions       General Comments      Pertinent Vitals/ Pain       Pain Assessment:  Faces Faces Pain Scale: No hurt  Home Living                                          Prior Functioning/Environment              Frequency  Min 2X/week        Progress Toward Goals  OT Goals(current goals can now be found in the care plan section)  Progress towards OT goals: Progressing toward goals  Acute Rehab OT Goals Patient Stated Goal: to improve mobility and activity tolerance OT Goal Formulation: With patient Time  For Goal Achievement: 12/09/19 Potential to Achieve Goals: Good  Plan Discharge plan remains appropriate    Co-evaluation                 AM-PAC OT "6 Clicks" Daily Activity     Outcome Measure   Help from another person eating meals?: A Little Help from another person taking care of personal grooming?: A Little Help from another person toileting, which includes using toliet, bedpan, or urinal?: Total Help from another person bathing (including washing, rinsing, drying)?: A Lot Help from another person to put on and taking off regular upper body clothing?: A Lot Help from another person to put on and taking off regular lower body clothing?: A Lot 6 Click Score: 13    End of Session    OT Visit Diagnosis: Muscle weakness (generalized) (M62.81);Other abnormalities of gait and mobility (R26.89);Hemiplegia and hemiparesis Hemiplegia - Right/Left: Left Hemiplegia - dominant/non-dominant: Non-Dominant   Activity Tolerance Patient limited by fatigue;Patient limited by lethargy (Atavan prior to session)   Patient Left in bed;with call bell/phone within reach;with family/visitor present   Nurse Communication Mobility status        Time: 0272-5366 OT Time Calculation (min): 16 min  Charges: OT General Charges $OT Visit: 1 Visit OT Treatments $Neuromuscular Re-education: 8-22 mins  Corinne Ports E. Khanh Cordner, COTA/L Acute Rehabilitation Services [place] 11/30/2019, 3:09 PM

## 2019-11-30 NOTE — Progress Notes (Signed)
Referring Physician(s): Rosalin Hawking (neurology)  Supervising Physician: Luanne Bras  Patient Status:  [facility] - In-pt  Chief Complaint: None  Subjective:  History of acute CVA (left temporooccipital infarct and R>L watershed infarcts) s/p diagnostic cerebral arteriogram 11/23/2019 by Dr. Estanislado Pandy revealing bilateral common carotid artery stenosis- thought to be cause of recent CVA (left-sided weakness- symptomatic from right CCA stenosis); with tentative plans for carotid revascularization on hold secondary to patient's respiratory status. Patient awake and alert laying in bed. Daughter at bedside. States his hematuria has significantly improved at this time. Still with coarse rhonchi with almost every breath- per patient/daughter about the same as yesterday.   Allergies: Chantix [varenicline]  Medications: Prior to Admission medications   Medication Sig Start Date End Date Taking? Authorizing Provider  acetaminophen (TYLENOL) 500 MG tablet Take 500 mg by mouth every 6 (six) hours as needed for mild pain, fever or headache.    Yes [provider]  albuterol (VENTOLIN HFA) 108 (90 Base) MCG/ACT inhaler TAKE 2 PUFFS BY MOUTH EVERY 6 HOURS AS NEEDED FOR WHEEZE OR SHORTNESS OF BREATH Patient taking differently: Inhale 2 puffs into the lungs every 6 (six) hours as needed for wheezing or shortness of breath.  08/30/19  Yes Rigoberto Noel, MD  benzonatate (TESSALON) 100 MG capsule Take 1 capsule (100 mg total) by mouth every 8 (eight) hours. 11/09/19  Yes Dorie Rank, MD  Glycopyrrolate-Formoterol (BEVESPI AEROSPHERE) 9-4.8 MCG/ACT AERO Inhale 2 puffs into the lungs 2 (two) times daily. 06/26/19  Yes Rigoberto Noel, MD  guaiFENesin (MUCINEX) 600 MG 12 hr tablet Take 600 mg by mouth 2 (two) times daily as needed for cough.   Yes [provider]  guaiFENesin (ROBITUSSIN) 100 MG/5ML liquid Take 200 mg by mouth 3 (three) times daily as needed for cough.   Yes [provider]  nicotine (NICODERM CQ - DOSED IN MG/24 HOURS) 14 mg/24hr patch Place 1 patch (14 mg total) onto the skin daily. For 6 weeks.  Then decrease to 7 mg daily. 07/25/19  Yes Yopp, Amber C, RPH-CPP  tamsulosin (FLOMAX) 0.4 MG CAPS capsule Take 0.4 mg by mouth at bedtime. 10/10/19  Yes [provider]  budesonide (PULMICORT) 0.5 MG/2ML nebulizer solution Take 2 mLs (0.5 mg total) by nebulization 2 (two) times daily. 11/21/19   Martyn Ehrich, NP  [employer] ASPIRIN EC 325 MG EC tablet TAKE 1 TABLET BY MOUTH EVERY DAY Patient taking differently: Take 325 mg by mouth daily.  08/16/19   Vaslow, Acey Lav, MD  ipratropium-albuterol (DUONEB) 0.5-2.5 (3) MG/3ML SOLN Take 3 mLs by nebulization every 6 (six) hours as needed. Patient taking differently: Take 3 mLs by nebulization every 6 (six) hours as needed (sob).  11/21/19   Martyn Ehrich, NP  nicotine polacrilex (NICORETTE) 4 MG gum Take 1 each (4 mg total) by mouth as needed for smoking cessation. Patient not taking: Reported on 11/15/2019 07/25/19   Yopp, [employer] C, RPH-CPP     Vital Signs: BP 131/79 (BP Location: Right Arm)   Pulse 98   Temp 97.6 F (36.4 C) (Oral)   Resp 20   Ht 5\' 5"  (1.651 m)   Wt 106 lb (48.1 kg)   SpO2 96%   BMI 17.64 kg/m   Physical Exam Vitals and nursing note reviewed.  Constitutional:      General: He is not in acute distress. Pulmonary:     Effort: Pulmonary effort is normal.     Breath sounds:  Wheezing and rhonchi present.  Musculoskeletal:     Comments: (+) right BKA.  Skin:    General: Skin is warm and dry.  Neurological:     Mental Status: He is alert and oriented to person, place, and time.     Imaging: DG Chest Port 1 View  Result Date: 11/28/2019 CLINICAL DATA:  Dyspnea EXAM: PORTABLE CHEST 1 VIEW COMPARISON:  Prior chest x-ray 11/25/2019 FINDINGS: Stable chronic atelectasis of the right upper lobe resulting in hyperinflation of the right lower and right middle lobes. Similar  appearance of mild patchy airspace opacities in the right lung base. No significant interval progression. Cardiac and mediastinal contours remain unchanged. Chronic bronchitic changes are unchanged. No pleural effusion or pneumothorax. No acute osseous abnormality. IMPRESSION: Stable chest x-ray without significant interval change compared to 11/25/2019. Electronically Signed   By: Jacqulynn Cadet M.D.   On: 11/28/2019 09:08    Labs:  CBC: Recent Labs    11/25/19 0450 11/26/19 1021 11/27/19 0431 11/28/19 0413  WBC 14.3* 14.0* 12.1* 10.6*  HGB 11.7* 11.3* 10.1* 8.7*  HCT 36.0* 34.7* 31.3* 26.4*  PLT 490* 508* 440* 424*    COAGS: Recent Labs    11/21/19 2158 11/28/19 0413  INR 1.2 1.1  APTT 28  --     BMP: Recent Labs    10/28/19 1935 10/28/19 1935 10/29/19 0401 10/29/19 0401 10/30/19 0542 10/30/19 0542 10/31/19 0523 11/09/19 2136 11/27/19 0431 11/28/19 0413 11/29/19 0334 11/30/19 0342  NA 136   < > 137   < > 132*   < > 135   < > 136 138 139 141  K 4.2   < > 4.4   < > 4.4   < > 4.8   < > 3.5 3.8 4.2 4.2  CL 100   < > 102   < > 99   < > 99   < > 102 103 104 106  CO2 25   < > 24   < > 25   < > 25   < > 25 26 26 26   GLUCOSE 90   < > 96   < > 150*   < > 141*   < > 135* 119* 178* 147*  BUN 10   < > 9   < > 11   < > 13   < > 10 11 16 14   CALCIUM 8.7*   < > 8.8*   < > 8.8*   < > 8.7*   < > 8.4* 8.6* 8.5* 8.6*  CREATININE 0.70   < > 0.81   < > 0.68   < > 0.64   < > 0.62 0.74 0.64 0.66  GFRNONAA >60   < > >60   < > >60   < > >60   < > >60 >60 >60 >60  GFRAA >60  --  >60  --  >60  --  >60  --   --   --   --   --    < > = values in this interval not displayed.    LIVER FUNCTION TESTS: Recent Labs    11/21/19 1855 11/28/19 0413 11/29/19 0334 11/30/19 0342  BILITOT <0.1* 0.4 0.4 0.4  AST 71* 28 30 28   ALT 94* 28 32 34  ALKPHOS 89 78 82 77  PROT 6.3* 5.3* 5.5* 5.2*  ALBUMIN 2.9* 2.1* 2.1* 2.2*    Assessment and Plan:  History of acute CVA (left  temporooccipital infarct and R>L  watershed infarcts) s/p diagnostic cerebral arteriogram 11/23/2019 by Dr. Estanislado Pandy revealing bilateral common carotid artery stenosis- thought to be cause of recent CVA (left-sided weakness- symptomatic from right CCA stenosis); with tentative plans for carotid revascularization on hold secondary to patient's respiratory status. Patient still with no improvement in respiratory status. Need patient's breathing optimized prior to scheduling procedure- recommend treating during this hospitalization. Hopefully will have some improvement over the weekend so can plan for procedure next week. Still with pink tinged urine but hematuria has significantly improved (no clots). P2Y12 35 PRU today- per Dr. Estanislado Pandy continue taking Brilinta 30 mg twice daily and Aspirin 81 mg once daily. Further plans per TRH/neurology/CCM/urology- appreciate and agree with management. NIR to follow.   Electronically Signed: Earley Abide, PA-C 11/30/2019, 10:39 AM   I spent a total of 15 Minutes at the the patient's bedside AND on the patient's hospital floor or unit, greater than 50% of which was counseling/coordinating care for right common carotid artery stenosis/revascularization.

## 2019-11-30 NOTE — Progress Notes (Signed)
2 Days Post-Op Subjective: Pain controlled. No nausea or emesis. Tolerating foley. Audible wheezing and secretions with answers.   Objective: Vital signs in last 24 hours: Temp:  [97.8 F (36.6 C)-98 F (36.7 C)] 97.8 F (36.6 C) (10/29 0010) Pulse Rate:  [91-116] 108 (10/29 0010) Resp:  [18-24] 24 (10/28 1940) BP: (120-134)/(77-90) 134/77 (10/29 0010) SpO2:  [96 %-100 %] 100 % (10/29 0010) FiO2 (%):  [28 %] 28 % (10/28 0722)  Intake/Output from previous day: 10/28 0701 - 10/29 0700 In: 240 [P.O.:240] Out: 1200 [Urine:1200] Intake/Output this shift: Total I/O In: -  Out: 400 [Urine:400]   UOP: 1.2L clear yellow  Physical Exam:  General: Alert and oriented CV: RRR Lungs: Clear Abdomen: Soft, ND, NT Ext: NT, No erythema  Lab Results: Recent Labs    11/28/19 0413  HGB 8.7*  HCT 26.4*   BMET Recent Labs    11/29/19 0334 11/30/19 0342  NA 139 141  K 4.2 4.2  CL 104 106  CO2 26 26  GLUCOSE 178* 147*  BUN 16 14  CREATININE 0.64 0.66  CALCIUM 8.5* 8.6*     Studies/Results: DG Chest Port 1 View  Result Date: 11/28/2019 CLINICAL DATA:  Dyspnea EXAM: PORTABLE CHEST 1 VIEW COMPARISON:  Prior chest x-ray 11/25/2019 FINDINGS: Stable chronic atelectasis of the right upper lobe resulting in hyperinflation of the right lower and right middle lobes. Similar appearance of mild patchy airspace opacities in the right lung base. No significant interval progression. Cardiac and mediastinal contours remain unchanged. Chronic bronchitic changes are unchanged. No pleural effusion or pneumothorax. No acute osseous abnormality. IMPRESSION: Stable chest x-ray without significant interval change compared to 11/25/2019. Electronically Signed   By: Jacqulynn Cadet M.D.   On: 11/28/2019 09:08    Assessment/Plan: 1. Gross hematuria: in setting of anticoagulation for CVA. Foley draining clear yellow on 10/29. Irrigated without clots. 2. History of urinary retention starting  10/2019 3. BPH with LUTS 4. Other problems: L CVA, Recurrent non small cell lung cancer (IIIa s/p chemorads), traumatic R BKA, recurrent pneumonia, b/l carotid artery stenosis, COPD with excacerbation  -Foley draining clear yellow off of CBI. Irrigated without clots. -Keep foley to gravity. Irrigate foley as needed if not draining, clots present or dark pink urine. -F/u AM HH -Cr appropriate at 0.66 -Will arrange for f/u as outpatient -Following peripherally. Please call with questions.   LOS: 9 days   Janith Lima 11/30/2019, 6:21 AM [facility]  Pager: [phone]

## 2019-11-30 NOTE — Progress Notes (Signed)
PROGRESS NOTE    Evan [name]  OZD:[id number] DOB: 03-Dec-1950 DOA: 11/21/2019 PCP: Patient, No Pcp Per   Brief Narrative:  69 y.o. 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 Found to have a CVA: needs carotid stents.  Has other issues that will need outpatient follow up: hematuria and dysphagia/wet phonation.  Stent placement was on hold due to significant abnormal breath sounds.   Pulmonary and urology teams were consulted.  He was recommended to continue Foley, bronchodilators were adjusted.  Need to really address his nutrition status therefore feeding tube has been recommended-at least temporarily for now.   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  Bronchodilators per pulmonary team.  Appreciate their input. -Incentive spirometer and flutter valve -Continue Solu-Medrol -History of dysphagia-previous provider discussed with ENT-recommending Mucomyst.  Acute nonhemorrhagic CVA, left temporal occipital region -LDL 154, A1c 6.3.   Echocardiogram EF 55 to 60%.  CTA head and neck did not show LVO but showed severe bilateral CAD stenosis therefore IR consulted for stent placement. -Currently on Brilinta and aspirin.  Monitor for hematuria -Stent placement currently on hold-eventually will need this once his pulmonary status improves  Essential hypertension -Permissive hypertension  Hematuria, improving -Suspect from traumatic Foley placement with history of BPH.  Seen by urology, recommend intermittent flushing if necessary.  Foley catheter changed on 11/28/2019  Severe protein calorie malnutrition -At this point patient wants everything to be done to help him but continues to have very poor oral intake.  In order to align with his goals of care, he will need to improve his nutrition therefore recommended cortrak placement.  Patient is hesitant but family to have this conversation with him today.  DVT prophylaxis: enoxaparin (LOVENOX) injection 40 mg Start: 11/22/19 1000  Code Status: Full Code Family Communication: Spoke with daughter and son at bedside  Status is: Inpatient  Remains inpatient appropriate because:Hemodynamically unstable   Dispo: The patient is from: SNF              Anticipated d/c is to: SNF              Anticipated d/c date is: > 3 days              Patient currently is not medically stable to d/c.  Still significant abnormal breath sounds awaiting stent placement procedure.  In the meantime addressing his abnormal respiratory status and nutrition    Body mass index is 17.64 kg/m.     Subjective: Patient breathing appears to be slightly better today.  Had extensive discussion with him with family members at bedside  and is agreeable for feeding tube placement and is hopeful that he will get his strength back so we can get a stent placed and thereafter go to rehab.  Review of Systems Otherwise negative except as per HPI, including: General: Denies fever, chills, night sweats or  unintended weight loss. Resp: Denies hemoptysis Cardiac: Denies chest pain, palpitations, orthopnea, paroxysmal nocturnal dyspnea. GI: Denies abdominal pain, nausea, vomiting, diarrhea or constipation GU: Denies dysuria, frequency, hesitancy or incontinence MS: Denies muscle aches, joint pain or swelling Neuro: Denies headache, neurologic deficits (focal weakness, numbness, tingling), abnormal gait Psych: Denies anxiety, depression, SI/HI/AVH Skin: Denies new rashes or lesions ID: Denies sick contacts, exotic exposures, travel  Examination: Constitutional: Not in acute distress, chronically ill, bilateral temporal wasting Respiratory: Bilateral diffuse rhonchi Cardiovascular: Normal sinus rhythm, no rubs Abdomen: Nontender nondistended good bowel sounds Musculoskeletal: No edema noted Skin: No rashes seen Neurologic: CN 2-12 grossly intact.  And nonfocal Psychiatric: Normal judgment and insight. Alert and oriented x 3. Normal mood.  Objective: Vitals:   11/29/19 1557 11/29/19 1940 11/30/19 0010 11/30/19 0410  BP: 129/77  134/77   Pulse: 91 (!) 116 (!) 108   Resp: 18 (!) 24  20  Temp: 97.9 F (36.6 C)  97.8 F (36.6 C)   TempSrc: Oral  Oral   SpO2: 100% 100% 100% 94%  Weight:      Height:        Intake/Output Summary (Last 24 hours) at 11/30/2019 0800 Last data filed at 11/29/2019 2345 Gross per 24 hour  Intake --  Output 1200 ml  Net -1200 ml   Filed Weights   11/21/19 1819  Weight: 48.1 kg     Data Reviewed:   CBC: Recent Labs  Lab 11/25/19 0450 11/26/19 1021 11/27/19 0431 11/28/19 0413  WBC 14.3* 14.0* 12.1* 10.6*  HGB 11.7* 11.3* 10.1* 8.7*  HCT 36.0* 34.7* 31.3* 26.4*  MCV 82.6 84.0 83.2 83.5  PLT 490* 508* 440* 591*   Basic Metabolic Panel: Recent Labs  Lab 11/26/19 1021 11/27/19 0431 11/28/19 0413 11/29/19 0334 11/30/19 0342  NA 135 136 138 139 141  K 3.6 3.5 3.8 4.2 4.2  CL 100 102 103 104 106  CO2 24 25 26 26 26   GLUCOSE 112* 135*  119* 178* 147*  BUN 9 10 11 16 14   CREATININE 0.73 0.62 0.74 0.64 0.66  CALCIUM 8.8* 8.4* 8.6* 8.5* 8.6*   GFR: Estimated Creatinine Clearance: 60.1 mL/min (by C-G formula based on SCr of 0.66 mg/dL). Liver Function Tests: Recent Labs  Lab 11/28/19 0413 11/29/19 0334 11/30/19 0342  AST 28 30 28   ALT 28 32 34  ALKPHOS 78 82 77  BILITOT 0.4 0.4 0.4  PROT 5.3* 5.5* 5.2*  ALBUMIN 2.1* 2.1* 2.2*   No results for input(s): LIPASE, AMYLASE in the last 168 hours. No results for input(s): AMMONIA in the last 168 hours. Coagulation Profile: Recent Labs  Lab 11/28/19 0413  INR 1.1   Cardiac Enzymes: No results for input(s): CKTOTAL, CKMB, CKMBINDEX, TROPONINI in the last 168 hours. BNP (last 3 results) No results for input(s): PROBNP in the last 8760 hours. HbA1C: No results for input(s): HGBA1C in the last 72 hours. CBG: No results for input(s): GLUCAP in the last 168 hours. Lipid Profile: No results for input(s): CHOL, HDL, LDLCALC, TRIG, CHOLHDL, LDLDIRECT in the last 72 hours. Thyroid Function Tests: No results for input(s): TSH, T4TOTAL, FREET4, T3FREE, THYROIDAB in the last 72 hours. Anemia Panel: No results for input(s):  VITAMINB12, FOLATE, FERRITIN, TIBC, IRON, RETICCTPCT in the last 72 hours. Sepsis Labs: Recent Labs  Lab 11/28/19 1016  PROCALCITON 0.10    Recent Results (from the past 240 hour(s))  Resp Panel by RT PCR (RSV, Flu A&B, Covid) - Nasopharyngeal Swab     Status: None   Collection Time: 11/21/19  7:08 PM   Specimen: Nasopharyngeal Swab  Result Value Ref Range Status   SARS Coronavirus 2 by RT PCR NEGATIVE NEGATIVE Final    Comment: (NOTE) SARS-CoV-2 target nucleic acids are NOT DETECTED.  The SARS-CoV-2 RNA is generally detectable in upper respiratoy specimens during the acute phase of infection. The lowest concentration of SARS-CoV-2 viral copies this assay can detect is 131 copies/mL. A negative result does not preclude SARS-Cov-2 infection  and should not be used as the sole basis for treatment or other patient management decisions. A negative result may occur with  improper specimen collection/handling, submission of specimen other than nasopharyngeal swab, presence of viral mutation(s) within the areas targeted by this assay, and inadequate number of viral copies (<131 copies/mL). A negative result must be combined with clinical observations, patient history, and epidemiological information. The expected result is Negative.  Fact Sheet for Patients:  PinkCheek.be  Fact Sheet for Healthcare Providers:  GravelBags.it  This test is no t yet approved or cleared by the [place] FDA and  has been authorized for detection and/or diagnosis of SARS-CoV-2 by FDA under an Emergency Use Authorization (EUA). This EUA will remain  in effect (meaning this test can be used) for the duration of the COVID-19 declaration under Section 564(b)(1) of the Act, 21 U.S.C. section 360bbb-3(b)(1), unless the authorization is terminated or revoked sooner.     Influenza A by PCR NEGATIVE NEGATIVE Final   Influenza B by PCR NEGATIVE NEGATIVE Final    Comment: (NOTE) The Xpert Xpress SARS-CoV-2/FLU/RSV assay is intended as an aid in  the diagnosis of influenza from Nasopharyngeal swab specimens and  should not be used as a sole basis for treatment. Nasal washings and  aspirates are unacceptable for Xpert Xpress SARS-CoV-2/FLU/RSV  testing.  Fact Sheet for Patients: PinkCheek.be  Fact Sheet for Healthcare Providers: GravelBags.it  This test is not yet approved or cleared by the [place] FDA and  has been authorized for detection and/or diagnosis of SARS-CoV-2 by  FDA under an Emergency Use Authorization (EUA). This EUA will remain  in effect (meaning this test can be used) for the duration of the  Covid-19 declaration  under Section 564(b)(1) of the Act, 21  U.S.C. section 360bbb-3(b)(1), unless the authorization is  terminated or revoked.    Respiratory Syncytial Virus by PCR NEGATIVE NEGATIVE Final    Comment: (NOTE) Fact Sheet for Patients: PinkCheek.be  Fact Sheet for Healthcare Providers: GravelBags.it  This test is not yet approved or cleared by the [place] FDA and  has been authorized for detection and/or diagnosis of SARS-CoV-2 by  FDA under an Emergency Use Authorization (EUA). This EUA will remain  in effect (meaning this test can be used) for the duration of the  COVID-19 declaration under Section 564(b)(1) of the Act, 21 U.S.C.  section 360bbb-3(b)(1), unless the authorization is terminated or  revoked. Performed at [facility], [place] [address], [place], [place] [address]   MRSA PCR Screening     Status: None   Collection Time: 11/28/19  4:13 AM   Specimen: Nasal Mucosa; Nasopharyngeal  Result Value Ref Range Status   MRSA by PCR  NEGATIVE NEGATIVE Final    Comment:        The GeneXpert MRSA Assay (FDA approved for NASAL specimens only), is one component of a comprehensive MRSA colonization surveillance program. It is not intended to diagnose MRSA infection nor to guide or monitor treatment for MRSA infections. Performed at [facility], [place] [address] [place], [place] [address]          Radiology Studies: DG Chest Port 1 View  Result Date: 11/28/2019 CLINICAL DATA:  Dyspnea EXAM: PORTABLE CHEST 1 VIEW COMPARISON:  Prior chest x-ray 11/25/2019 FINDINGS: Stable chronic atelectasis of the right upper lobe resulting in hyperinflation of the right lower and right middle lobes. Similar appearance of mild patchy airspace opacities in the right lung base. No significant interval progression. Cardiac and mediastinal contours remain unchanged. Chronic bronchitic changes are unchanged. No  pleural effusion or pneumothorax. No acute osseous abnormality. IMPRESSION: Stable chest x-ray without significant interval change compared to 11/25/2019. Electronically Signed   By: Jacqulynn Cadet M.D.   On: 11/28/2019 09:08        Scheduled Meds: .  stroke: mapping our early stages of recovery book   Does not apply Once  . acetylcysteine  4 mL Nebulization BID  . arformoterol  15 mcg Nebulization BID  . aspirin EC  81 mg Oral Daily  . atorvastatin  80 mg Oral Daily  . budesonide (PULMICORT) nebulizer solution  0.25 mg Nebulization BID  . Chlorhexidine Gluconate Cloth  6 each Topical Daily  . enoxaparin (LOVENOX) injection  40 mg Subcutaneous Q24H  . feeding supplement  237 mL Oral TID BM  . guaiFENesin  600 mg Oral BID  . methylPREDNISolone (SOLU-MEDROL) injection  40 mg Intravenous Q8H  . multivitamin with minerals  1 tablet Oral Daily  . nicotine  14 mg Transdermal Daily  . pantoprazole  40 mg Oral BID  . revefenacin  175 mcg Nebulization Daily  . tamsulosin  0.4 mg Oral QHS  . ticagrelor  30 mg Oral BID   Continuous Infusions: . ampicillin-sulbactam (UNASYN) IV 3 g (11/29/19 2307)     LOS: 9 days   Time spent= 35 mins    Tivis Wherry Arsenio Loader, MD [facility]  If 7PM-7AM, please contact night-coverage  11/30/2019, 8:00 AM

## 2019-11-30 NOTE — Progress Notes (Signed)
  Speech Language Pathology Treatment: Dysphagia  Patient Details Name: Evan [name] MRN: [id number] DOB: Jan 08, 1951 Today's Date: 11/30/2019 Time: 1779-3903 SLP Time Calculation (min) (ACUTE ONLY): 20 min  Assessment / Plan / Recommendation Clinical Impression  Patient seen to address dysphagia goals. He continues with congested/rhonchi breathing on inhalation but this was unchanged prior to, during and after PO intake. Patient consumed 4 bites of puree (ice cream) with SLP holding cup of ice cream and patient managing spoon. He required tactile cues for performing and finding mouth when taking a bite. He consumed two cup sips of thin liquids (water). No overt s/s of aspiration noted but patient continues with weak cough, generalized weakness and continues to have very poor oral intake. He now has Cortrak for primary nutrition but to be encouraged to attempt oral intake as tolerated.     HPI HPI: Evan [name] is a 69 y.o. 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 Now with worsening dysphagia over past 24-48 hours.      SLP Plan  Continue with current plan of care       Recommendations  Diet recommendations: Dysphagia 1 (puree);Thin liquid Liquids provided via: Cup;Straw Medication Administration: Crushed with puree Supervision: Full supervision/cueing for compensatory strategies;Staff to assist with self feeding Compensations: Minimize environmental distractions;Slow rate;Small sips/bites;Follow solids with liquid Postural Changes and/or Swallow Maneuvers: Seated upright 90 degrees                Oral Care  Recommendations: Oral care BID Follow up Recommendations: 24 hour supervision/assistance;Skilled Nursing facility SLP Visit Diagnosis: Dysphagia, oropharyngeal phase (R13.12) Plan: Continue with current plan of care       [place], MA, CCC-SLP Speech Therapy

## 2019-11-30 NOTE — Progress Notes (Signed)
Nutrition Follow-up  DOCUMENTATION CODES:   Severe malnutrition in context of chronic illness  INTERVENTION:  Once NGT placed: -Begin Osmolite 1.2 cal @ 16ml/hr, advance 51ml/hr Q8H until goal rate of 32ml/hr is reached -5ml Prosource TF daily -158ml free water Q4H  Provides 1768 kcals, 90 grams protein, 113ml free water (1765ml total free water with flushes)  Pt may continue to receive Ensure Enlive if pt desires.   NUTRITION DIAGNOSIS:   Severe Malnutrition related to chronic illness, cancer and cancer related treatments as evidenced by severe muscle depletion, severe fat depletion.  ongoing  GOAL:   Patient will meet greater than or equal to 90% of their needs  Will address with TF   MONITOR:   PO intake, Supplement acceptance, Labs, Weight trends, I & O's, Diet advancement  REASON FOR ASSESSMENT:   Malnutrition Screening Tool    ASSESSMENT:   Pt admitted with CVA. PMH includes lung Ca stage III s/p chemo now in remission, recent hospitalization with recuurent PNA, COPD, depression, L renal mass, anxiety disorder, traumatic R BKA.  10/22 cerebral angiogram  Pt continues to have extremely poor intake. Pt had orders to receive Cortrak today; however, Cortrak team was unable to place tube at bedside. Discussed with MD. Consult has been placed for IR to place NGT. Pt/family hopeful that temporary TF will help pt regain strength and allow for him to have a stent placed.   UOP: 1237ml x24 hours  Labs reviewed.  Medications: solu-medrol, MVI, Ensure Enlive TID, protonix   Diet Order:   Diet Order            DIET - DYS 1 Room service appropriate? Yes; Fluid consistency: Thin  Diet effective now                 EDUCATION NEEDS:   Education needs have been addressed  Skin:  Skin Assessment: Reviewed RN Assessment  Last BM:  10/27  Height:   Ht Readings from Last 1 Encounters:  11/21/19 5\' 5"  (1.651 m)    Weight:   Wt Readings from Last 1  Encounters:  11/21/19 48.1 kg   BMI:  Body mass index is 17.64 kg/m.  Estimated Nutritional Needs:   Kcal:  1700-1900  Protein:  85-100 grams  Fluid:  >/=1.7L/d    Larkin Ina, MS, RD, LDN RD pager number and weekend/on-call pager number located in [place].

## 2019-12-01 DIAGNOSIS — I10 Essential (primary) hypertension: Secondary | ICD-10-CM | POA: Diagnosis not present

## 2019-12-01 LAB — COMPREHENSIVE METABOLIC PANEL
ALT: 37 U/L (ref 0–44)
AST: 26 U/L (ref 15–41)
Albumin: 2.2 g/dL — ABNORMAL LOW (ref 3.5–5.0)
Alkaline Phosphatase: 81 U/L (ref 38–126)
Anion gap: 12 (ref 5–15)
BUN: 15 mg/dL (ref 8–23)
CO2: 23 mmol/L (ref 22–32)
Calcium: 8.3 mg/dL — ABNORMAL LOW (ref 8.9–10.3)
Chloride: 102 mmol/L (ref 98–111)
Creatinine, Ser: 0.63 mg/dL (ref 0.61–1.24)
GFR, Estimated: >60 mL/min (ref >60)
Glucose, Bld: 158 mg/dL — ABNORMAL HIGH (ref 70–99)
Potassium: 4.7 mmol/L (ref 3.5–5.1)
Sodium: 137 mmol/L (ref 135–145)
Total Bilirubin: 0.3 mg/dL (ref 0.3–1.2)
Total Protein: 5.3 g/dL — ABNORMAL LOW (ref 6.5–8.1)

## 2019-12-01 LAB — GLUCOSE, CAPILLARY
Glucose-Capillary: 143 mg/dL — ABNORMAL HIGH (ref 70–99)
Glucose-Capillary: 143 mg/dL — ABNORMAL HIGH (ref 70–99)
Glucose-Capillary: 161 mg/dL — ABNORMAL HIGH (ref 70–99)
Glucose-Capillary: 165 mg/dL — ABNORMAL HIGH (ref 70–99)
Glucose-Capillary: 180 mg/dL — ABNORMAL HIGH (ref 70–99)
Glucose-Capillary: 189 mg/dL — ABNORMAL HIGH (ref 70–99)

## 2019-12-01 LAB — CBC
HCT: 25.9 % — ABNORMAL LOW (ref 39.0–52.0)
Hemoglobin: 8.2 g/dL — ABNORMAL LOW (ref 13.0–17.0)
MCH: 27.1 pg (ref 26.0–34.0)
MCHC: 31.7 g/dL (ref 30.0–36.0)
MCV: 85.5 fL (ref 80.0–100.0)
Platelets: 492 10*3/uL — ABNORMAL HIGH (ref 150–400)
RBC: 3.03 MIL/uL — ABNORMAL LOW (ref 4.22–5.81)
RDW: 17.3 % — ABNORMAL HIGH (ref 11.5–15.5)
WBC: 19.7 10*3/uL — ABNORMAL HIGH (ref 4.0–10.5)
nRBC: 0.2 % (ref 0.0–0.2)

## 2019-12-01 LAB — PHOSPHORUS
Phosphorus: 3.7 mg/dL (ref 2.5–4.6)
Phosphorus: 2.9 mg/dL (ref 2.5–4.6)

## 2019-12-01 LAB — MAGNESIUM
Magnesium: 2.1 mg/dL (ref 1.7–2.4)
Magnesium: 2.1 mg/dL (ref 1.7–2.4)

## 2019-12-01 LAB — TROPONIN I (HIGH SENSITIVITY)
Troponin I (High Sensitivity): 107 ng/L (ref <18)
Troponin I (High Sensitivity): 142 ng/L (ref <18)

## 2019-12-01 MED ORDER — INSULIN ASPART 100 UNIT/ML ~~LOC~~ SOLN
0.0000 [IU] | Freq: Every day | SUBCUTANEOUS | Status: DC
  Administered 2019-12-05: 2 [IU] via SUBCUTANEOUS

## 2019-12-01 MED ORDER — ALUM & MAG HYDROXIDE-SIMETH 200-200-20 MG/5ML PO SUSP
30.0000 mL | Freq: Four times a day (QID) | ORAL | Status: AC | PRN
  Administered 2019-12-01: 30 mL via ORAL
  Filled 2019-12-01: qty 30

## 2019-12-01 MED ORDER — INSULIN ASPART 100 UNIT/ML ~~LOC~~ SOLN
0.0000 [IU] | SUBCUTANEOUS | Status: DC
  Administered 2019-12-01 – 2019-12-05 (×9): 1 [IU] via SUBCUTANEOUS
  Administered 2019-12-05 (×2): 2 [IU] via SUBCUTANEOUS
  Administered 2019-12-06: 1 [IU] via SUBCUTANEOUS
  Administered 2019-12-09 (×2): 2 [IU] via SUBCUTANEOUS
  Administered 2019-12-09 – 2019-12-11 (×6): 1 [IU] via SUBCUTANEOUS

## 2019-12-01 MED ORDER — METHYLPREDNISOLONE SODIUM SUCC 40 MG IJ SOLR
40.0000 mg | Freq: Two times a day (BID) | INTRAMUSCULAR | Status: DC
  Administered 2019-12-01 – 2019-12-06 (×10): 40 mg via INTRAVENOUS
  Filled 2019-12-01 (×10): qty 1

## 2019-12-01 NOTE — Progress Notes (Signed)
°  Speech Language Pathology Treatment: Dysphagia  Patient Details Name: Evan [name] MRN: [id number] DOB: 26-Apr-1950 Today's Date: 12/01/2019 Time: 8264-1583 SLP Time Calculation (min) (ACUTE ONLY): 14 min  Assessment / Plan / Recommendation Clinical Impression  Daughter arrived at RN station requesting PO for her father from SLP. Daughter reported confusion about her fathers diet and potential dysphagia. SLP completed chart review to assist daughter in clarifying pts prior swallowing assessments. Per chart, pt has suffered from recurrent pna, but without significant finding of dysphagia on MBS. MBS showed prolonged mastication and mild residue, cleared with a liquid wash. Not likely a severe enough problem to result in a dysphagia related aspiration pna. Pt noted to be extremely rhonchorous with inability to clear secretions, prior to any PO given. Per his daughter, pt was eating 100% of his regular textured meals prior to this admission. But after transition to pureed foods, intake has been very poor as pt does not like the meals. Under direct observation, pt able to swallow liquids and solids without signs of aspiration, though he did reports discomfort with swallowing due to friction from feeding tube. Tube was placed to supplement nutrition prior to important procedure planned for next week.   Overall, pt is capable of masticating solids and drinking liquids with minimal increased risk of aspiration, given that primary problem appears to be secretion management and cough strength. Limiting diet has only decreased pts access to nutrition and comfort. Recommend pt resume a regular diet and thin liquids with basic aspiration precautions including upright posture and extra attention to oral care to decrease bacterial load of oral secretions. Will continue EMST interventions for cough strength and monitor pt for signs of potential post prandial aspiration as a primary esophageal dysphagia has not been  assessed.      HPI HPI: Evan [name] is a 69 y.o. 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 Now with worsening dysphagia over past 24-48 hours.      SLP Plan  Continue with current plan of care       Recommendations  Diet recommendations: Regular;Thin liquid Liquids provided via: Cup;Straw Medication Administration: Whole meds with puree Supervision: Full supervision/cueing for compensatory strategies;Staff to assist with self feeding Compensations: Follow solids with liquid;Slow rate;Small sips/bites Postural Changes and/or Swallow Maneuvers: Seated upright 90 degrees                Follow up Recommendations: 24 hour supervision/assistance;Skilled Nursing facility SLP Visit Diagnosis: Dysphagia, oropharyngeal phase (R13.12) Plan: Continue with current plan of care       GO                Chaddrick Brue, Katherene Ponto 12/01/2019, 10:24 AM

## 2019-12-01 NOTE — Progress Notes (Signed)
PROGRESS NOTE    Evan [name]  NOB:[id number] DOB: Jul 14, 1950 DOA: 11/21/2019 PCP: Patient, No Pcp Per   Brief Narrative:  69 y.o. 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 Found to have a CVA: needs carotid stents.  Has other issues that will need outpatient follow up: hematuria and dysphagia/wet phonation.  Stent placement was on hold due to significant abnormal breath sounds.   Pulmonary and urology teams were consulted.  He was recommended to continue Foley, bronchodilators were adjusted.  Due to poor nutrition and oral intake, feeding tube placed 11/30/2019.   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  Bronchodilators per pulmonary team.  Appreciate their input. -Bronchodilators-Brovana, Pulmicort, Yupelri -Incentive spirometer and flutter valve, Mucinex -Reduce Solu-Medrol to every 12 hours and slowly taper this off. -On Mucomyst -On Unasyn for suspicion of aspiration pneumonia  Acute nonhemorrhagic CVA, left temporal occipital  region -LDL 154, A1c 6.3.  Echocardiogram EF 55 to 60%.  CTA head and neck did not show LVO but showed severe bilateral CAD stenosis therefore IR consulted for stent placement. -Lipitor 40 mg daily -Currently on Brilinta and aspirin.  Monitor for hematuria -Stent placement on hold until pulmonary status improves  Essential hypertension -Permissive hypertension  Hematuria, improving -Suspect from traumatic Foley placement with history of BPH.  Seen by urology, recommend intermittent flushing if necessary.  Foley catheter changed on 11/28/2019  Severe protein calorie malnutrition -Temporary feeding tube placed 11/30/2019.  Speech therapist thinks he would also do better with regular diet therefore I have added regular diet as well.  DVT prophylaxis: enoxaparin (LOVENOX) injection 40 mg Start: 11/22/19 1000  Code Status: Full Code Family Communication: Daughter at bedside  Status is: Inpatient  Remains inpatient appropriate because:Hemodynamically unstable   Dispo: The patient is from: SNF              Anticipated d/c is to: SNF              Anticipated d/c date is: > 3 days              Patient currently is not medically stable to d/c.  Still significant abnormal breath sounds awaiting stent placement procedure.  In the meantime addressing his abnormal respiratory status and nutrition    Body mass index is 18.82 kg/m.     Subjective: Tolerated feeding tube placement well yesterday.  Overnight had issues with some suctioning but for now feels okay no new complaints   Examination: Constitutional: Not in acute distress, feeding tube in place, bilateral temporal wasting Respiratory: Bilateral rhonchi-diffuse Cardiovascular: Normal sinus rhythm, no rubs  Abdomen: Nontender nondistended good bowel sounds Musculoskeletal: No edema noted Skin: No rashes seen Neurologic: CN 2-12 grossly intact.  And nonfocal Psychiatric: Normal judgment and insight. Alert and oriented x 3. Normal  mood. Objective: Vitals:   11/30/19 1952 11/30/19 2329 12/01/19 0333 12/01/19 0741  BP: (!) 141/84 133/79 130/73 137/90  Pulse: 100 98 91 89  Resp: 18 18 18 18   Temp: 98 F (36.7 C) 98.1 F (36.7 C) 98 F (36.7 C) 97.6 F (36.4 C)  TempSrc: Oral Oral Oral Oral  SpO2: 94% 96% 96% 97%  Weight:   51.3 kg   Height:        Intake/Output Summary (Last 24 hours) at 12/01/2019 0750 Last data filed at 11/30/2019 2329 Gross per 24 hour  Intake --  Output 600 ml  Net -600 ml   Filed Weights   11/21/19 1819 12/01/19 0333  Weight: 48.1 kg 51.3 kg     Data Reviewed:   CBC: Recent Labs  Lab 11/25/19 0450 11/26/19 1021 11/27/19 0431 11/28/19 0413  WBC 14.3* 14.0* 12.1* 10.6*  HGB 11.7* 11.3* 10.1* 8.7*  HCT 36.0* 34.7* 31.3* 26.4*  MCV 82.6 84.0 83.2 83.5  PLT 490* 508* 440* 277*   Basic Metabolic Panel: Recent Labs  Lab 11/27/19 0431 11/28/19 0413 11/29/19 0334 11/30/19 0342 11/30/19 0849 11/30/19 1752 12/01/19 0513  NA 136 138 139 141  --   --  137  K 3.5 3.8 4.2 4.2  --   --  4.7  CL 102 103 104 106  --   --  102  CO2 25 26 26 26   --   --  23  GLUCOSE 135* 119* 178* 147*  --   --  158*  BUN 10 11 16 14   --   --  15  CREATININE 0.62 0.74 0.64 0.66  --   --  0.63  CALCIUM 8.4* 8.6* 8.5* 8.6*  --   --  8.3*  MG  --   --   --   --  2.2 2.2 2.1  PHOS  --   --   --   --  3.6 2.9 3.7   GFR: Estimated Creatinine Clearance: 64.1 mL/min (by C-G formula based on SCr of 0.63 mg/dL). Liver Function Tests: Recent Labs  Lab 11/28/19 0413 11/29/19 0334 11/30/19 0342 12/01/19 0513  AST 28 30 28 26   ALT 28 32 34 37  ALKPHOS 78 82 77 81  BILITOT 0.4 0.4 0.4 0.3  PROT 5.3* 5.5* 5.2* 5.3*  ALBUMIN 2.1* 2.1* 2.2* 2.2*   No results for input(s): LIPASE, AMYLASE in the last 168 hours. No results for input(s): AMMONIA in the last 168 hours. Coagulation Profile: Recent Labs  Lab 11/28/19 0413  INR 1.1   Cardiac Enzymes: No results for input(s): CKTOTAL, CKMB,  CKMBINDEX, TROPONINI in the last 168 hours. BNP (last 3 results) No results for input(s): PROBNP in the last 8760 hours. HbA1C: No results for input(s): HGBA1C in the last 72 hours. CBG: Recent Labs  Lab 11/30/19 1601 11/30/19 1951 11/30/19 2328 12/01/19 0329 12/01/19 0745  GLUCAP 142* 164* 158* 165* 161*   Lipid Profile: No results for input(s): CHOL, HDL, LDLCALC, TRIG, CHOLHDL, LDLDIRECT in the last 72 hours. Thyroid Function Tests: No results for input(s): TSH, T4TOTAL, FREET4, T3FREE, THYROIDAB in the last 72 hours. Anemia Panel: No results for input(s): VITAMINB12, FOLATE, FERRITIN, TIBC, IRON, RETICCTPCT in the last 72 hours. Sepsis Labs: Recent Labs  Lab 11/28/19 1016  PROCALCITON 0.10  Recent Results (from the past 240 hour(s))  Resp Panel by RT PCR (RSV, Flu A&B, Covid) - Nasopharyngeal Swab     Status: None   Collection Time: 11/21/19  7:08 PM   Specimen: Nasopharyngeal Swab  Result Value Ref Range Status   SARS Coronavirus 2 by RT PCR NEGATIVE NEGATIVE Final    Comment: (NOTE) SARS-CoV-2 target nucleic acids are NOT DETECTED.  The SARS-CoV-2 RNA is generally detectable in upper respiratoy specimens during the acute phase of infection. The lowest concentration of SARS-CoV-2 viral copies this assay can detect is 131 copies/mL. A negative result does not preclude SARS-Cov-2 infection and should not be used as the sole basis for treatment or other patient management decisions. A negative result may occur with  improper specimen collection/handling, submission of specimen other than nasopharyngeal swab, presence of viral mutation(s) within the areas targeted by this assay, and inadequate number of viral copies (<131 copies/mL). A negative result must be combined with clinical observations, patient history, and epidemiological information. The expected result is Negative.  Fact Sheet for Patients:  PinkCheek.be  Fact Sheet for  Healthcare Providers:  GravelBags.it  This test is no t yet approved or cleared by the [place] FDA and  has been authorized for detection and/or diagnosis of SARS-CoV-2 by FDA under an Emergency Use Authorization (EUA). This EUA will remain  in effect (meaning this test can be used) for the duration of the COVID-19 declaration under Section 564(b)(1) of the Act, 21 U.S.C. section 360bbb-3(b)(1), unless the authorization is terminated or revoked sooner.     Influenza A by PCR NEGATIVE NEGATIVE Final   Influenza B by PCR NEGATIVE NEGATIVE Final    Comment: (NOTE) The Xpert Xpress SARS-CoV-2/FLU/RSV assay is intended as an aid in  the diagnosis of influenza from Nasopharyngeal swab specimens and  should not be used as a sole basis for treatment. Nasal washings and  aspirates are unacceptable for Xpert Xpress SARS-CoV-2/FLU/RSV  testing.  Fact Sheet for Patients: PinkCheek.be  Fact Sheet for Healthcare Providers: GravelBags.it  This test is not yet approved or cleared by the [place] FDA and  has been authorized for detection and/or diagnosis of SARS-CoV-2 by  FDA under an Emergency Use Authorization (EUA). This EUA will remain  in effect (meaning this test can be used) for the duration of the  Covid-19 declaration under Section 564(b)(1) of the Act, 21  U.S.C. section 360bbb-3(b)(1), unless the authorization is  terminated or revoked.    Respiratory Syncytial Virus by PCR NEGATIVE NEGATIVE Final    Comment: (NOTE) Fact Sheet for Patients: PinkCheek.be  Fact Sheet for Healthcare Providers: GravelBags.it  This test is not yet approved or cleared by the [place] FDA and  has been authorized for detection and/or diagnosis of SARS-CoV-2 by  FDA under an Emergency Use Authorization (EUA). This EUA will remain  in effect  (meaning this test can be used) for the duration of the  COVID-19 declaration under Section 564(b)(1) of the Act, 21 U.S.C.  section 360bbb-3(b)(1), unless the authorization is terminated or  revoked. Performed at [facility], [place] [address] [place], [place] [address]   MRSA PCR Screening     Status: None   Collection Time: 11/28/19  4:13 AM   Specimen: Nasal Mucosa; Nasopharyngeal  Result Value Ref Range Status   MRSA by PCR NEGATIVE NEGATIVE Final    Comment:        The GeneXpert MRSA Assay (FDA approved for NASAL specimens only), is  one component of a comprehensive MRSA colonization surveillance program. It is not intended to diagnose MRSA infection nor to guide or monitor treatment for MRSA infections. Performed at [facility], [place] [address], [place], [place] [address]          Radiology Studies: DG Abd 1 View  Result Date: 11/30/2019 CLINICAL DATA:  Feeding tube placement EXAM: ABDOMEN - 1 VIEW COMPARISON:  None. FINDINGS: Feeding tube passes through the stomach into the duodenum. Feeding tube tip in the second portion duodenum. Water-soluble contrast injected into the tube filling the duodenum and stomach. IMPRESSION: Feeding tube tip second portion of duodenum. Electronically Signed   By: Franchot Gallo M.D.   On: 11/30/2019 14:50        Scheduled Meds: .  stroke: mapping our early stages of recovery book   Does not apply Once  . acetylcysteine  4 mL Nebulization BID  . arformoterol  15 mcg Nebulization BID  . aspirin EC  81 mg Oral Daily  . atorvastatin  80 mg Oral Daily  . budesonide (PULMICORT) nebulizer solution  0.25 mg Nebulization BID  . Chlorhexidine Gluconate Cloth  6 each Topical Daily  . enoxaparin (LOVENOX) injection  40 mg Subcutaneous Q24H  . feeding supplement  237 mL Oral TID BM  . feeding supplement (PROSource TF)  45 mL Per Tube Daily  . free water  100 mL Per Tube Q4H  . guaiFENesin  600 mg Oral BID  .  methylPREDNISolone (SOLU-MEDROL) injection  40 mg Intravenous Q8H  . multivitamin with minerals  1 tablet Oral Daily  . nicotine  14 mg Transdermal Daily  . pantoprazole  40 mg Oral BID  . revefenacin  175 mcg Nebulization Daily  . tamsulosin  0.4 mg Oral QHS  . ticagrelor  30 mg Oral BID   Continuous Infusions: . ampicillin-sulbactam (UNASYN) IV 3 g (12/01/19 0119)  . feeding supplement (OSMOLITE 1.2 CAL) 1,000 mL (11/30/19 1504)     LOS: 10 days   Time spent= 35 mins    Aleiyah Halpin Arsenio Loader, MD [facility]  If 7PM-7AM, please contact night-coverage  12/01/2019, 7:50 AM

## 2019-12-01 NOTE — Progress Notes (Signed)
Half way through vest CPT, pt requested to be taken off due to increased SOB. Pt has a weak cough and is able to cough out some of his secretions, however you can still very easily hear his secretions. When I stressed the importance of NTS him, the pt refused. RT will monitor.

## 2019-12-01 NOTE — Progress Notes (Signed)
Pt refused vest CPT.

## 2019-12-02 DIAGNOSIS — I10 Essential (primary) hypertension: Secondary | ICD-10-CM | POA: Diagnosis not present

## 2019-12-02 LAB — BASIC METABOLIC PANEL
Anion gap: 8 (ref 5–15)
BUN: 16 mg/dL (ref 8–23)
CO2: 29 mmol/L (ref 22–32)
Calcium: 8.2 mg/dL — ABNORMAL LOW (ref 8.9–10.3)
Chloride: 97 mmol/L — ABNORMAL LOW (ref 98–111)
Creatinine, Ser: 0.66 mg/dL (ref 0.61–1.24)
GFR, Estimated: >60 mL/min (ref >60)
Glucose, Bld: 207 mg/dL — ABNORMAL HIGH (ref 70–99)
Potassium: 4.7 mmol/L (ref 3.5–5.1)
Sodium: 134 mmol/L — ABNORMAL LOW (ref 135–145)

## 2019-12-02 LAB — PHOSPHORUS: Phosphorus: 2.5 mg/dL (ref 2.5–4.6)

## 2019-12-02 LAB — GLUCOSE, CAPILLARY
Glucose-Capillary: 168 mg/dL — ABNORMAL HIGH (ref 70–99)
Glucose-Capillary: 171 mg/dL — ABNORMAL HIGH (ref 70–99)
Glucose-Capillary: 179 mg/dL — ABNORMAL HIGH (ref 70–99)
Glucose-Capillary: 140 mg/dL — ABNORMAL HIGH (ref 70–99)
Glucose-Capillary: 163 mg/dL — ABNORMAL HIGH (ref 70–99)

## 2019-12-02 LAB — MAGNESIUM: Magnesium: 2 mg/dL (ref 1.7–2.4)

## 2019-12-02 MED ORDER — SCOPOLAMINE 1 MG/3DAYS TD PT72
1.0000 | MEDICATED_PATCH | TRANSDERMAL | Status: DC
  Administered 2019-12-02 – 2019-12-11 (×4): 1.5 mg via TRANSDERMAL
  Filled 2019-12-02 (×4): qty 1

## 2019-12-02 NOTE — Progress Notes (Signed)
Sounds much clearer with Scopolamine patch.  Resting comfortably at this time.  Did call Dr Reesa Chew to see if he wants fdgs stopped at [place] in case he can have the procedure tomorrow.  Dr agreed and said will discuss with team in the morning if he remains the same to see if will proceed.

## 2019-12-02 NOTE — Progress Notes (Signed)
PROGRESS NOTE    Evan [name]  SEG:[id number] DOB: 02-09-1950 DOA: 11/21/2019 PCP: Patient, No Pcp Per   Brief Narrative:  69 y.o. 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 Found to have a CVA: needs carotid stents.  Has other issues that will need outpatient follow up: hematuria and dysphagia/wet phonation.  Stent placement was on hold due to significant abnormal breath sounds.   Pulmonary and urology teams were consulted.  He was recommended to continue Foley, bronchodilators were adjusted.  Due to poor nutrition and oral intake, feeding tube placed 11/30/2019.   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  Bronchodilators per pulmonary team.  Appreciate their input. -Bronchodilators-Brovana, Pulmicort, Yupelri -Incentive spirometer and flutter valve, Mucinex -Solu-Medrol every 12 hours, slow taper -On Mucomyst -Add trial of scopolamine patch -Day 7 of Unasyn for suspicion for aspiration  pneumonia.  Will stop and clinically monitor  Atypical left-sided chest pain, resolved -Cardiac enzymes mildly elevated suspect secondary to demand ischemia due to underlying pulmonary pathology.  EKG is mostly nonischemic.  Recent echo 10/21-EF 55 to 73%, grade 1 diastolic dysfunction with no regional wall motion abnormality.  Clinically monitor this for now.  He would not be candidate for any acute intervention for now anyways.  Leukocytosis -Secondary to steroid  Acute nonhemorrhagic CVA, left temporal occipital region -LDL 154, A1c 6.3.  Echocardiogram EF 55 to 60%.  CTA head and neck did not show LVO but showed severe bilateral CAD stenosis therefore IR consulted for stent placement. -Lipitor 40 mg daily -Currently on Brilinta and aspirin.  Monitor for hematuria -Stent placement on hold until pulmonary status improves  Essential hypertension -Permissive hypertension  Hematuria, resolved -Suspect from traumatic Foley placement with history of BPH.  Seen by urology, recommend intermittent flushing if necessary.  Foley catheter changed on 11/28/2019  Severe protein calorie malnutrition -Temporary feeding tube placed 11/30/2019.  Speech therapist thinks he would also do better with regular diet therefore I have added regular diet as well.  DVT prophylaxis: enoxaparin (LOVENOX) injection 40 mg Start: 11/22/19 1000  Code Status: Full Code Family Communication: Daughter at bedside  Status is: Inpatient  Remains inpatient appropriate because:Hemodynamically unstable   Dispo: The patient is from: SNF              Anticipated d/c is to: SNF              Anticipated d/c date is: > 3 days              Patient currently is not medically stable to  d/c.  Still significant abnormal breath sounds awaiting stent placement procedure.  In the meantime addressing his abnormal respiratory status and nutrition    Body mass index is 18.82 kg/m.     Subjective: Seems to be little better  spirits today, tells me he feels little better and wants to sit out in the chair. Examination: Constitutional: Chronically ill-appearing with bilateral temporal wasting, feeding tube in place Respiratory: Bilateral rhonchi Cardiovascular: Normal sinus rhythm, no rubs Abdomen: Nontender nondistended good bowel sounds Musculoskeletal: Right lower extremity BKA Skin: No rashes seen Neurologic: CN 2-12 grossly intact.  And nonfocal Psychiatric: Normal judgment and insight. Alert and oriented x 3. Normal mood. Objective: Vitals:   12/01/19 1951 12/01/19 2100 12/02/19 0041 12/02/19 0500  BP:  111/77 107/70 96/65  Pulse: 97 (!) 110 98 97  Resp: (!) 21 20 19 20   Temp:  97.6 F (36.4 C) (!) 97.5 F (36.4 C) 98.2 F (36.8 C)  TempSrc:  Oral Oral Oral  SpO2: 98% 98% 100% 100%  Weight:      Height:        Intake/Output Summary (Last 24 hours) at 12/02/2019 0752 Last data filed at 12/02/2019 0600 Gross per 24 hour  Intake 1157 ml  Output 2000 ml  Net -843 ml   Filed Weights   11/21/19 1819 12/01/19 0333  Weight: 48.1 kg 51.3 kg     Data Reviewed:   CBC: Recent Labs  Lab 11/26/19 1021 11/27/19 0431 11/28/19 0413 12/01/19 2315  WBC 14.0* 12.1* 10.6* 19.7*  HGB 11.3* 10.1* 8.7* 8.2*  HCT 34.7* 31.3* 26.4* 25.9*  MCV 84.0 83.2 83.5 85.5  PLT 508* 440* 424* 829*   Basic Metabolic Panel: Recent Labs  Lab 11/28/19 0413 11/29/19 0334 11/30/19 0342 11/30/19 0849 11/30/19 1752 12/01/19 0513 12/01/19 1656 12/01/19 2315  NA 138 139 141  --   --  137  --  134*  K 3.8 4.2 4.2  --   --  4.7  --  4.7  CL 103 104 106  --   --  102  --  97*  CO2 26 26 26   --   --  23  --  29  GLUCOSE 119* 178* 147*  --   --  158*  --  207*  BUN 11 16 14   --   --  15  --  16  CREATININE 0.74 0.64 0.66  --   --  0.63  --  0.66  CALCIUM 8.6* 8.5* 8.6*  --   --  8.3*  --  8.2*  MG  --   --   --  2.2 2.2 2.1 2.1 2.0  PHOS  --   --   --  3.6 2.9 3.7 2.9 2.5   GFR: Estimated Creatinine  Clearance: 64.1 mL/min (by C-G formula based on SCr of 0.66 mg/dL). Liver Function Tests: Recent Labs  Lab 11/28/19 0413 11/29/19 0334 11/30/19 0342 12/01/19 0513  AST 28 30 28 26   ALT 28 32 34 37  ALKPHOS 78 82 77 81  BILITOT 0.4 0.4 0.4 0.3  PROT 5.3* 5.5* 5.2* 5.3*  ALBUMIN 2.1* 2.1* 2.2* 2.2*   No results for input(s): LIPASE, AMYLASE in the last 168 hours. No results for input(s): AMMONIA in the last 168 hours. Coagulation Profile: Recent Labs  Lab 11/28/19 0413  INR 1.1   Cardiac Enzymes: No results for input(s): CKTOTAL, CKMB, CKMBINDEX, TROPONINI in the last 168 hours. BNP (last 3 results) No results for input(s): PROBNP  in the last 8760 hours. HbA1C: No results for input(s): HGBA1C in the last 72 hours. CBG: Recent Labs  Lab 12/01/19 1142 12/01/19 1554 12/01/19 2029 12/01/19 2351 12/02/19 0408  GLUCAP 180* 143* 143* 189* 168*   Lipid Profile: No results for input(s): CHOL, HDL, LDLCALC, TRIG, CHOLHDL, LDLDIRECT in the last 72 hours. Thyroid Function Tests: No results for input(s): TSH, T4TOTAL, FREET4, T3FREE, THYROIDAB in the last 72 hours. Anemia Panel: No results for input(s): VITAMINB12, FOLATE, FERRITIN, TIBC, IRON, RETICCTPCT in the last 72 hours. Sepsis Labs: Recent Labs  Lab 11/28/19 1016  PROCALCITON 0.10    Recent Results (from the past 240 hour(s))  MRSA PCR Screening     Status: None   Collection Time: 11/28/19  4:13 AM   Specimen: Nasal Mucosa; Nasopharyngeal  Result Value Ref Range Status   MRSA by PCR NEGATIVE NEGATIVE Final    Comment:        The GeneXpert MRSA Assay (FDA approved for NASAL specimens only), is one component of a comprehensive MRSA colonization surveillance program. It is not intended to diagnose MRSA infection nor to guide or monitor treatment for MRSA infections. Performed at [facility], [place] [address], [place], [place] [address]          Radiology Studies: DG Abd 1 View  Result Date:  11/30/2019 CLINICAL DATA:  Feeding tube placement EXAM: ABDOMEN - 1 VIEW COMPARISON:  None. FINDINGS: Feeding tube passes through the stomach into the duodenum. Feeding tube tip in the second portion duodenum. Water-soluble contrast injected into the tube filling the duodenum and stomach. IMPRESSION: Feeding tube tip second portion of duodenum. Electronically Signed   By: Franchot Gallo M.D.   On: 11/30/2019 14:50        Scheduled Meds: .  stroke: mapping our early stages of recovery book   Does not apply Once  . acetylcysteine  4 mL Nebulization BID  . arformoterol  15 mcg Nebulization BID  . aspirin EC  81 mg Oral Daily  . atorvastatin  80 mg Oral Daily  . budesonide (PULMICORT) nebulizer solution  0.25 mg Nebulization BID  . Chlorhexidine Gluconate Cloth  6 each Topical Daily  . enoxaparin (LOVENOX) injection  40 mg Subcutaneous Q24H  . feeding supplement  237 mL Oral TID BM  . feeding supplement (PROSource TF)  45 mL Per Tube Daily  . free water  100 mL Per Tube Q4H  . guaiFENesin  600 mg Oral BID  . insulin aspart  0-5 Units Subcutaneous QHS  . insulin aspart  0-6 Units Subcutaneous Q4H  . methylPREDNISolone (SOLU-MEDROL) injection  40 mg Intravenous Q12H  . multivitamin with minerals  1 tablet Oral Daily  . nicotine  14 mg Transdermal Daily  . pantoprazole  40 mg Oral BID  . revefenacin  175 mcg Nebulization Daily  . tamsulosin  0.4 mg Oral QHS  . ticagrelor  30 mg Oral BID   Continuous Infusions: . ampicillin-sulbactam (UNASYN) IV 3 g (12/02/19 0037)  . feeding supplement (OSMOLITE 1.2 CAL) 1,000 mL (12/02/19 0044)     LOS: 11 days   Time spent= 35 mins    Fatemah Pourciau Arsenio Loader, MD [facility]  If 7PM-7AM, please contact night-coverage  12/02/2019, 7:52 AM

## 2019-12-03 DIAGNOSIS — I10 Essential (primary) hypertension: Secondary | ICD-10-CM | POA: Diagnosis not present

## 2019-12-03 LAB — PHOSPHORUS: Phosphorus: 3.3 mg/dL (ref 2.5–4.6)

## 2019-12-03 LAB — BASIC METABOLIC PANEL
Anion gap: 10 (ref 5–15)
BUN: 14 mg/dL (ref 8–23)
CO2: 26 mmol/L (ref 22–32)
Calcium: 8.1 mg/dL — ABNORMAL LOW (ref 8.9–10.3)
Chloride: 96 mmol/L — ABNORMAL LOW (ref 98–111)
Creatinine, Ser: 0.52 mg/dL — ABNORMAL LOW (ref 0.61–1.24)
GFR, Estimated: >60 mL/min (ref >60)
Glucose, Bld: 92 mg/dL (ref 70–99)
Potassium: 4.7 mmol/L (ref 3.5–5.1)
Sodium: 132 mmol/L — ABNORMAL LOW (ref 135–145)

## 2019-12-03 LAB — GLUCOSE, CAPILLARY
Glucose-Capillary: 115 mg/dL — ABNORMAL HIGH (ref 70–99)
Glucose-Capillary: 116 mg/dL — ABNORMAL HIGH (ref 70–99)
Glucose-Capillary: 140 mg/dL — ABNORMAL HIGH (ref 70–99)
Glucose-Capillary: 149 mg/dL — ABNORMAL HIGH (ref 70–99)
Glucose-Capillary: 194 mg/dL — ABNORMAL HIGH (ref 70–99)
Glucose-Capillary: 99 mg/dL (ref 70–99)

## 2019-12-03 LAB — MAGNESIUM: Magnesium: 2.2 mg/dL (ref 1.7–2.4)

## 2019-12-03 NOTE — Progress Notes (Signed)
Patient resting comfortably on 3L [place]. No respiratory distress noted. BIPAP not needed at this time. RT will monitor as needed. 

## 2019-12-03 NOTE — Progress Notes (Signed)
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 Estanislado Pandy revealing bilateral common carotid artery stenosis- thought to be cause of recent CVA (left-sided weakness- symptomatic from right CCA stenosis); with tentative plans for carotid revascularization on hold secondary to patient's respiratory status. Patient awake and alert laying in bed. No hematuria noted. Still with coarse rhonchi with almost every breath- per patient this has slightly improved (states not as constant- now more intermittent). Per RN/notes patient with improvement in breathing last evening following Scopolamine patch, however now has worsened again.   Allergies: Chantix [varenicline]  Medications: Prior to Admission medications   Medication Sig Start Date End Date Taking? Authorizing Provider  acetaminophen (TYLENOL) 500 MG tablet Take 500 mg by mouth every 6 (six) hours as needed for mild pain, fever or headache.    Yes [provider]  albuterol (VENTOLIN HFA) 108 (90 Base) MCG/ACT inhaler TAKE 2 PUFFS BY MOUTH EVERY 6 HOURS AS NEEDED FOR WHEEZE OR SHORTNESS OF BREATH Patient taking differently: Inhale 2 puffs into the lungs every 6 (six) hours as needed for wheezing or shortness of breath.  08/30/19  Yes Rigoberto Noel, MD  benzonatate (TESSALON) 100 MG capsule Take 1 capsule (100 mg total) by mouth every 8 (eight) hours. 11/09/19  Yes Dorie Rank, MD  Glycopyrrolate-Formoterol (BEVESPI AEROSPHERE) 9-4.8 MCG/ACT AERO Inhale 2 puffs into the lungs 2 (two) times daily. 06/26/19  Yes Rigoberto Noel, MD  guaiFENesin (MUCINEX) 600 MG 12 hr tablet Take 600 mg by mouth 2 (two) times daily as needed for cough.   Yes [provider]  guaiFENesin (ROBITUSSIN) 100  MG/5ML liquid Take 200 mg by mouth 3 (three) times daily as needed for cough.   Yes [provider]  nicotine (NICODERM CQ - DOSED IN MG/24 HOURS) 14 mg/24hr patch Place 1 patch (14 mg total) onto the skin daily. For 6 weeks.  Then decrease to 7 mg daily. 07/25/19  Yes Yopp, Amber C, RPH-CPP  tamsulosin (FLOMAX) 0.4 MG CAPS capsule Take 0.4 mg by mouth at bedtime. 10/10/19  Yes [provider]  budesonide (PULMICORT) 0.5 MG/2ML nebulizer solution Take 2 mLs (0.5 mg total) by nebulization 2 (two) times daily. 11/21/19   Martyn Ehrich, NP  [employer] ASPIRIN EC 325 MG EC tablet TAKE 1 TABLET BY MOUTH EVERY DAY Patient taking differently: Take 325 mg by mouth daily.  08/16/19   Vaslow, Acey Lav, MD  ipratropium-albuterol (DUONEB) 0.5-2.5 (3) MG/3ML SOLN Take 3 mLs by nebulization every 6 (six) hours as needed. Patient taking differently: Take 3 mLs by nebulization every 6 (six) hours as needed (sob).  11/21/19   Martyn Ehrich, NP  nicotine polacrilex (NICORETTE) 4 MG gum Take 1 each (4 mg total) by mouth as needed for smoking cessation. Patient not taking: Reported on 11/15/2019 07/25/19   Yopp, [employer] C, RPH-CPP     Vital Signs: BP 102/70 (BP Location: Right Arm)   Pulse 93   Temp 98.1 F (36.7 C) (Oral)   Resp (!) 22   Ht 5\' 5"  (1.651 m)   Wt 105 lb 13.1 oz (48 kg)   SpO2 100%   BMI 17.61 kg/m   Physical Exam Vitals and nursing note reviewed.  Constitutional:      General: He is not in acute  distress. Cardiovascular:     Rate and Rhythm: Normal rate and regular rhythm.     Heart sounds: Normal heart sounds. No murmur heard.   Pulmonary:     Effort: Pulmonary effort is normal. No respiratory distress.     Breath sounds: Wheezing and rhonchi present.  Musculoskeletal:     Comments: (+) right BKA.   Skin:    General: Skin is warm and dry.  Neurological:     Mental Status: He is alert and oriented to person, place, and time.     Imaging: DG Abd 1  View  Result Date: 11/30/2019 CLINICAL DATA:  Feeding tube placement EXAM: ABDOMEN - 1 VIEW COMPARISON:  None. FINDINGS: Feeding tube passes through the stomach into the duodenum. Feeding tube tip in the second portion duodenum. Water-soluble contrast injected into the tube filling the duodenum and stomach. IMPRESSION: Feeding tube tip second portion of duodenum. Electronically Signed   By: Franchot Gallo M.D.   On: 11/30/2019 14:50    Labs:  CBC: Recent Labs    11/26/19 1021 11/27/19 0431 11/28/19 0413 12/01/19 2315  WBC 14.0* 12.1* 10.6* 19.7*  HGB 11.3* 10.1* 8.7* 8.2*  HCT 34.7* 31.3* 26.4* 25.9*  PLT 508* 440* 424* 492*    COAGS: Recent Labs    11/21/19 2158 11/28/19 0413  INR 1.2 1.1  APTT 28  --     BMP: Recent Labs    10/28/19 1935 10/28/19 1935 10/29/19 0401 10/29/19 0401 10/30/19 0542 10/30/19 0542 10/31/19 0523 11/09/19 2136 11/30/19 0342 12/01/19 0513 12/01/19 2315 12/03/19 0223  NA 136   < > 137   < > 132*   < > 135   < > 141 137 134* 132*  K 4.2   < > 4.4   < > 4.4   < > 4.8   < > 4.2 4.7 4.7 4.7  CL 100   < > 102   < > 99   < > 99   < > 106 102 97* 96*  CO2 25   < > 24   < > 25   < > 25   < > 26 23 29 26   GLUCOSE 90   < > 96   < > 150*   < > 141*   < > 147* 158* 207* 92  BUN 10   < > 9   < > 11   < > 13   < > 14 15 16 14   CALCIUM 8.7*   < > 8.8*   < > 8.8*   < > 8.7*   < > 8.6* 8.3* 8.2* 8.1*  CREATININE 0.70   < > 0.81   < > 0.68   < > 0.64   < > 0.66 0.63 0.66 0.52*  GFRNONAA >60   < > >60   < > >60   < > >60   < > >60 >60 >60 >60  GFRAA >60  --  >60  --  >60  --  >60  --   --   --   --   --    < > = values in this interval not displayed.    LIVER FUNCTION TESTS: Recent Labs    11/28/19 0413 11/29/19 0334 11/30/19 0342 12/01/19 0513  BILITOT 0.4 0.4 0.4 0.3  AST 28 30 28 26   ALT 28 32 34 37  ALKPHOS 78 82 77 81  PROT 5.3* 5.5* 5.2* 5.3*  ALBUMIN 2.1* 2.1* 2.2* 2.2*  Assessment and Plan:  History of acute CVA (left  temporooccipital infarct and R>L watershed infarcts) s/p diagnostic cerebral arteriogram 11/23/2019 by Dr. Estanislado Pandy revealing bilateral common carotid artery stenosis- thought to be cause of recent CVA (left-sided weakness- symptomatic from right CCA stenosis); with tentative plans for carotid revascularization on hold secondary to patient's respiratory status. Discussed patient's respiratory status with Dr. Reesa Chew [facility]) who states patient's breathing had improved approximately 20-30% using Scopolamine patch but now is slightly worse, in addition states breathing tends to improve as day progresses (once sitting in chair/up and moving). Will reach out to anesthesia regarding clearance for anesthesia. No plans for IR intervention today- patient's diet to be restarted per TRH. No hematuria noted. Continue taking Brilinta 30 mg twice daily and Aspirin 81 mg once daily. Further plans per TRH/neurology/CCM/urology- appreciate and agree with management. NIR to follow.   Electronically Signed: Earley Abide, PA-C 12/03/2019, 10:09 AM   I spent a total of 15 Minutes at the the patient's bedside AND on the patient's hospital floor or unit, greater than 50% of which was counseling/coordinating care for right common carotid artery stenosis/revascularization.

## 2019-12-03 NOTE — Progress Notes (Signed)
Physical Therapy Treatment Patient Details Name: Evan [name] MRN: [id number] DOB: 12-Dec-1950 Today's Date: 12/03/2019    History of Present Illness 69 y.o. male with medical history significant of lung cancer apparently stage IIIa status post chemo now in remission, recent hospitalization with recurrent pneumonia, COPD, depression, left renal mass, anxiety disorder, and traumatic R BKA, presents with confusion, weakness, and mild aphasia. CT and MRI demonstrate new left parieto-occipital infarct. On 11/22/2019 pt developing agitation and respiratory distress requiring BiPAP and haldol. Pt underwent bilateral cerebral arteriogram on 11/23/2019 demonstrating severe bilateral common carotid artery stenosis at proximal/mid levels.    PT Comments    Pt with notably decreased secretion-laden breath sounds upon arrival to room, and pt excited for OOB. Pt required max assist for bed mobility and transfer to recliner this day, pt limited by fatigue and feeling "like I am going to fall out". VSS, dizziness initially upon sitting EOB but passed quickly. Pt tolerated LE strengthening exercises well, occasionally requiring PT assist to perform due to weakness. LUE propped upon cessation of session due to this PT noticing dependent edema. Will continue to follow acutely .   Follow Up Recommendations  SNF;Supervision/Assistance - 24 hour     Equipment Recommendations  Other (comment) (mechanical lift if home today)    Recommendations for Other Services       Precautions / Restrictions Precautions Precautions: Fall Precaution Comments: Rt BKA (prosthesis in room, not donned today as pt states it is "it will take all of my energy") Restrictions Weight Bearing Restrictions: No    Mobility  Bed Mobility Overal bed mobility: Needs Assistance Bed Mobility: Supine to Sit     Supine to sit: Max assist     General bed mobility comments: max assist for trunk and LE management, scooting to EOB, and  keeping upright balance. LUE placed in WB position through hand for proprioceptive input.  Transfers Overall transfer level: Needs assistance Equipment used: 1 person hand held assist Transfers: Sit to/from [facility] Sit to Stand: Max assist Stand pivot transfers: Max assist       General transfer comment: Max assist for power up, hip extension, steadying, and pivot to recliner towards R.  Ambulation/Gait             General Gait Details: NT - pt declines donning prosthesis   Stairs             Wheelchair Mobility    Modified Rankin (Stroke Patients Only) Modified Rankin (Stroke Patients Only) Pre-Morbid Rankin Score: Slight disability Modified Rankin: Severe disability     Balance Overall balance assessment: Needs assistance Sitting-balance support: Single extremity supported;Feet supported Sitting balance-Leahy Scale: Poor Sitting balance - Comments: posterior leaning with at least min assist to trunk to maintain upright sitting balance   Standing balance support: During functional activity Standing balance-Leahy Scale: Zero Standing balance comment: max assist                            Cognition Arousal/Alertness: Awake/alert Behavior During Therapy: WFL for tasks assessed/performed;Flat affect Overall Cognitive Status: Impaired/Different from baseline Area of Impairment: Following commands;Safety/judgement;Awareness;Problem solving                       Following Commands: Follows one step commands with increased time Safety/Judgement: Decreased awareness of safety;Decreased awareness of deficits Awareness: Emergent Problem Solving: Difficulty sequencing;Requires verbal cues;Requires tactile cues General Comments: Pt following all commands this  day, but requires increased time. Pt disappointed that he is still hospitalized this day      Exercises General Exercises - Lower Extremity Quad Sets: AROM;Both;10  reps;Supine Heel Slides: AAROM;Left;10 reps;Supine (with emphasis on not falling into hip abd/ER) Straight Leg Raises: Right;10 reps;Supine    General Comments General comments (skin integrity, edema, etc.): SpO2 98% on 3LO2      Pertinent Vitals/Pain Pain Assessment: Faces Faces Pain Scale: Hurts little more Pain Location: generalized during mobility Pain Descriptors / Indicators: Discomfort;Grimacing Pain Intervention(s): Limited activity within patient's tolerance;Monitored during session;Repositioned    Home Living                      Prior Function            PT Goals (current goals can now be found in the care plan section) Acute Rehab PT Goals Patient Stated Goal: to improve mobility and activity tolerance PT Goal Formulation: With patient/family Time For Goal Achievement: 12/08/19 Potential to Achieve Goals: Fair Progress towards PT goals: Progressing toward goals    Frequency    Min 3X/week      PT Plan Current plan remains appropriate    Co-evaluation              AM-PAC PT "6 Clicks" Mobility   Outcome Measure  Help needed turning from your back to your side while in a flat bed without using bedrails?: A Lot Help needed moving from lying on your back to sitting on the side of a flat bed without using bedrails?: A Lot Help needed moving to and from a bed to a chair (including a wheelchair)?: Total Help needed standing up from a chair using your arms (e.g., wheelchair or bedside chair)?: Total Help needed to walk in hospital room?: Total Help needed climbing 3-5 steps with a railing? : Total 6 Click Score: 8    End of Session Equipment Utilized During Treatment: Oxygen Activity Tolerance: Patient limited by fatigue Patient left: with call bell/phone within reach;in chair;with chair alarm set;with nursing/sitter in room Nurse Communication: Mobility status PT Visit Diagnosis: Other abnormalities of gait and mobility  (R26.89);Unsteadiness on feet (R26.81);Muscle weakness (generalized) (M62.81);Other symptoms and signs involving the nervous system (R29.898);Hemiplegia and hemiparesis Hemiplegia - Right/Left: Left Hemiplegia - dominant/non-dominant: Non-dominant Hemiplegia - caused by: Cerebral infarction     Time: 0321-2248 PT Time Calculation (min) (ACUTE ONLY): 23 min  Charges:  $Therapeutic Exercise: 8-22 mins $Therapeutic Activity: 8-22 mins                     Evan [name] E, PT Acute Rehabilitation Services Pager [phone]  Office [phone]     Evan [name] 12/03/2019, 4:05 PM

## 2019-12-03 NOTE — Consult Note (Signed)
I was consulted for this patient who is in need of interventional radiology procedure for bilateral carotid artery stenosis with recent CVA. His medical history is significant for lung cancer s/p chemo (in remission), recurrent aspiration pneumonia, and COPD. He was scheduled last week but postponed due to poor respiratory status. At this time he has made minimal progress but subjectively he says he feels stronger. He continues to have coarse bilateral breath sounds and requires 3L O2 by [place] but is saturating 97-100%. It is my impression that he may be as optimized as possible at this time, but he clearly remains high risk.   I spoke to the patient at length about the risks and benefits of anesthesia and the different potential options. He is high risk for requiring postop ventilation if intubated and I spoke to him about this. I also discussed the possibility of doing the procedure under sedation which he understands could mean not being fully asleep but would avoid intubation. I spoke with Dr. Estanislado Pandy and he believes this procedure could be done with sedation if the patient is cooperative, so this may be the best initial option.  Feel free to contact anesthesiology for any further questions about this patient or for significant change in clinical status by calling "[facility]" on [place].   Lidia Collum, MD 12/03/19 1:11 PM

## 2019-12-03 NOTE — Progress Notes (Signed)
PROGRESS NOTE    Evan [name]  OAC:[id number] DOB: 12-29-1950 DOA: 11/21/2019 PCP: Patient, No Pcp Per   Brief Narrative:  69 y.o. 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 Found to have a CVA: needs carotid stents.  Has other issues that will need outpatient follow up: hematuria and dysphagia/wet phonation.  Stent placement was on hold due to significant abnormal breath sounds.   Pulmonary and urology teams were consulted.  He was recommended to continue Foley, bronchodilators were adjusted.  Due to poor nutrition and oral intake, feeding tube placed 11/30/2019.   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  Bronchodilators per pulmonary team.  Appreciate their input. -Bronchodilators-Brovana, Pulmicort, Yupelri -Incentive spirometer and flutter valve, Mucinex -Solu-Medrol every 12 hours, slow taper -On Mucomyst -Improved with scopolamine patch but still having rhonchorous breath sounds this  morning -Refusing chest vest -Day 7 of Unasyn for suspicion for aspiration pneumonia.  Will stop and clinically monitor  Atypical left-sided chest pain, resolved -Cardiac enzymes mildly elevated suspect secondary to demand ischemia due to underlying pulmonary pathology.  EKG is mostly nonischemic.  Recent echo 10/21-EF 55 to 01%, grade 1 diastolic dysfunction with no regional wall motion abnormality.  Clinically monitor this for now.  He would not be candidate for any acute intervention for now anyways.  Leukocytosis -Secondary to steroid  Acute nonhemorrhagic CVA, left temporal occipital region -LDL 154, A1c 6.3.  Echocardiogram EF 55 to 60%.  CTA head and neck did not show LVO but showed severe bilateral CAD stenosis therefore IR consulted for stent placement. -Lipitor 40 mg daily -Currently on Brilinta and aspirin.  Monitor for hematuria -Holding off on stent placement until breathing status improves.  Essential hypertension -Permissive hypertension  Hematuria, resolved -Suspect from traumatic Foley placement with history of BPH.  Seen by urology, recommend intermittent flushing if necessary.  Foley catheter changed on 11/28/2019  Severe protein calorie malnutrition -Temporary feeding tube placed 11/30/2019.  Speech therapist thinks he would also do better with regular diet therefore I have added regular diet as well.  If his breathing status improves over next 2-3 hours, will inform interventional list for possible stent placement today in the meantime keep him n.p.o.  If still remains same, will resume tube feeds and continue current management and make him n.p.o. past midnight again.  We will do day-to-day assessment.  Out of bed to chair at least 4 to 6 hours  DVT prophylaxis: enoxaparin (LOVENOX) injection 40 mg Start: 11/22/19 1000  Code Status: Full Code Family Communication: Daughter at bedside  Status  is: Inpatient  Remains inpatient appropriate because:Hemodynamically  unstable   Dispo: The patient is from: SNF              Anticipated d/c is to: SNF              Anticipated d/c date is: > 3 days              Patient currently is not medically stable to d/c.  Still significant abnormal breath sounds awaiting stent placement procedure.  In the meantime addressing his abnormal respiratory status and nutrition    Body mass index is 17.61 kg/m.     Subjective: Yesterday evening his breathing was significantly better after getting Scopolamine patch therefore made n.p.o. past midnight.  This morning he feels better but still having rhonchorous breath sounds.  Heart rate at bedside as well.  Examination: Constitutional: Appears chronically ill, feeding tube in place, temporal wasting Respiratory: Bilateral rhonchi Cardiovascular: Normal sinus rhythm, no rubs Abdomen: Nontender nondistended good bowel sounds Musculoskeletal: Right-sided BKA noted Skin: No rashes seen Neurologic: CN 2-12 grossly intact.  And nonfocal Psychiatric: Normal judgment and insight. Alert and oriented x 3. Normal mood. Objective: Vitals:   12/03/19 0300 12/03/19 0500 12/03/19 0821 12/03/19 0822  BP: (!) 99/51  102/70   Pulse: 96  93   Resp: 20  (!) 22   Temp: 98.4 F (36.9 C)  98.1 F (36.7 C)   TempSrc: Oral  Oral   SpO2: 100%   100%  Weight:  48 kg    Height:        Intake/Output Summary (Last 24 hours) at 12/03/2019 0854 Last data filed at 12/03/2019 0527 Gross per 24 hour  Intake 620 ml  Output 1900 ml  Net -1280 ml   Filed Weights   11/21/19 1819 12/01/19 0333 12/03/19 0500  Weight: 48.1 kg 51.3 kg 48 kg     Data Reviewed:   CBC: Recent Labs  Lab 11/26/19 1021 11/27/19 0431 11/28/19 0413 12/01/19 2315  WBC 14.0* 12.1* 10.6* 19.7*  HGB 11.3* 10.1* 8.7* 8.2*  HCT 34.7* 31.3* 26.4* 25.9*  MCV 84.0 83.2 83.5 85.5  PLT 508* 440* 424* 269*   Basic Metabolic Panel: Recent Labs  Lab 11/29/19 0334 11/30/19 0342 11/30/19 0849 11/30/19 1752  12/01/19 0513 12/01/19 1656 12/01/19 2315 12/03/19 0223  NA 139 141  --   --  137  --  134* 132*  K 4.2 4.2  --   --  4.7  --  4.7 4.7  CL 104 106  --   --  102  --  97* 96*  CO2 26 26  --   --  23  --  29 26  GLUCOSE 178* 147*  --   --  158*  --  207* 92  BUN 16 14  --   --  15  --  16 14  CREATININE 0.64 0.66  --   --  0.63  --  0.66 0.52*  CALCIUM 8.5* 8.6*  --   --  8.3*  --  8.2* 8.1*  MG  --   --    < > 2.2 2.1 2.1 2.0 2.2  PHOS  --   --    < > 2.9 3.7 2.9 2.5 3.3   < > = values in this interval not displayed.   GFR: Estimated Creatinine Clearance: 60 mL/min (A) (by C-G formula based on SCr of 0.52 mg/dL (L)). Liver Function Tests: Recent Labs  Lab 11/28/19  0413 11/29/19 0334 11/30/19 0342 12/01/19 0513  AST 28 30 28 26   ALT 28 32 34 37  ALKPHOS 78 82 77 81  BILITOT 0.4 0.4 0.4 0.3  PROT 5.3* 5.5* 5.2* 5.3*  ALBUMIN 2.1* 2.1* 2.2* 2.2*   No results for input(s): LIPASE, AMYLASE in the last 168 hours. No results for input(s): AMMONIA in the last 168 hours. Coagulation Profile: Recent Labs  Lab 11/28/19 0413  INR 1.1   Cardiac Enzymes: No results for input(s): CKTOTAL, CKMB, CKMBINDEX, TROPONINI in the last 168 hours. BNP (last 3 results) No results for input(s): PROBNP in the last 8760 hours. HbA1C: No results for input(s): HGBA1C in the last 72 hours. CBG: Recent Labs  Lab 12/02/19 1535 12/02/19 2047 12/03/19 0010 12/03/19 0400 12/03/19 0820  GLUCAP 140* 163* 194* 99 116*   Lipid Profile: No results for input(s): CHOL, HDL, LDLCALC, TRIG, CHOLHDL, LDLDIRECT in the last 72 hours. Thyroid Function Tests: No results for input(s): TSH, T4TOTAL, FREET4, T3FREE, THYROIDAB in the last 72 hours. Anemia Panel: No results for input(s): VITAMINB12, FOLATE, FERRITIN, TIBC, IRON, RETICCTPCT in the last 72 hours. Sepsis Labs: Recent Labs  Lab 11/28/19 1016  PROCALCITON 0.10    Recent Results (from the past 240 hour(s))  MRSA PCR Screening     Status:  None   Collection Time: 11/28/19  4:13 AM   Specimen: Nasal Mucosa; Nasopharyngeal  Result Value Ref Range Status   MRSA by PCR NEGATIVE NEGATIVE Final    Comment:        The GeneXpert MRSA Assay (FDA approved for NASAL specimens only), is one component of a comprehensive MRSA colonization surveillance program. It is not intended to diagnose MRSA infection nor to guide or monitor treatment for MRSA infections. Performed at [facility], [place] [address], [place], [place] [address]          Radiology Studies: No results found.      Scheduled Meds: .  stroke: mapping our early stages of recovery book   Does not apply Once  . acetylcysteine  4 mL Nebulization BID  . arformoterol  15 mcg Nebulization BID  . aspirin EC  81 mg Oral Daily  . atorvastatin  80 mg Oral Daily  . budesonide (PULMICORT) nebulizer solution  0.25 mg Nebulization BID  . Chlorhexidine Gluconate Cloth  6 each Topical Daily  . enoxaparin (LOVENOX) injection  40 mg Subcutaneous Q24H  . feeding supplement  237 mL Oral TID BM  . feeding supplement (PROSource TF)  45 mL Per Tube Daily  . free water  100 mL Per Tube Q4H  . guaiFENesin  600 mg Oral BID  . insulin aspart  0-5 Units Subcutaneous QHS  . insulin aspart  0-6 Units Subcutaneous Q4H  . methylPREDNISolone (SOLU-MEDROL) injection  40 mg Intravenous Q12H  . multivitamin with minerals  1 tablet Oral Daily  . nicotine  14 mg Transdermal Daily  . pantoprazole  40 mg Oral BID  . revefenacin  175 mcg Nebulization Daily  . scopolamine  1 patch Transdermal Q72H  . tamsulosin  0.4 mg Oral QHS  . ticagrelor  30 mg Oral BID   Continuous Infusions: . feeding supplement (OSMOLITE 1.2 CAL) 1,000 mL (12/02/19 2132)     LOS: 12 days   Time spent= 35 mins    Marcelle Hepner Arsenio Loader, MD [facility]  If 7PM-7AM, please contact night-coverage  12/03/2019, 8:54 AM

## 2019-12-03 NOTE — Progress Notes (Signed)
SLP Cancellation Note  Patient Details Name: ADEKUNLE ROHRBACH MRN: [id number] DOB: Dec 30, 1950   Cancelled treatment:       Reason Eval/Treat Not Completed: Other (comment). Pt NPO for a potential procedure. Will f/u    Kofi Murrell, Katherene Ponto 12/03/2019, 11:46 AM

## 2019-12-03 NOTE — Progress Notes (Signed)
Patient refuses Chest Vest.  Pt also refuses any type of suctioning.  Weak cough, agrees to use flutter valve.  I talked with patient as to why suctioning was needed he continues to refuse.

## 2019-12-04 LAB — GLUCOSE, CAPILLARY
Glucose-Capillary: 110 mg/dL — ABNORMAL HIGH (ref 70–99)
Glucose-Capillary: 118 mg/dL — ABNORMAL HIGH (ref 70–99)
Glucose-Capillary: 128 mg/dL — ABNORMAL HIGH (ref 70–99)
Glucose-Capillary: 133 mg/dL — ABNORMAL HIGH (ref 70–99)
Glucose-Capillary: 143 mg/dL — ABNORMAL HIGH (ref 70–99)
Glucose-Capillary: 185 mg/dL — ABNORMAL HIGH (ref 70–99)

## 2019-12-04 LAB — CBC
HCT: 27.8 % — ABNORMAL LOW (ref 39.0–52.0)
Hemoglobin: 8.9 g/dL — ABNORMAL LOW (ref 13.0–17.0)
MCH: 27.8 pg (ref 26.0–34.0)
MCHC: 32 g/dL (ref 30.0–36.0)
MCV: 86.9 fL (ref 80.0–100.0)
Platelets: 558 10*3/uL — ABNORMAL HIGH (ref 150–400)
RBC: 3.2 MIL/uL — ABNORMAL LOW (ref 4.22–5.81)
RDW: 18.7 % — ABNORMAL HIGH (ref 11.5–15.5)
WBC: 15.5 10*3/uL — ABNORMAL HIGH (ref 4.0–10.5)
nRBC: 0.1 % (ref 0.0–0.2)

## 2019-12-04 LAB — BASIC METABOLIC PANEL
Anion gap: 11 (ref 5–15)
BUN: 17 mg/dL (ref 8–23)
CO2: 26 mmol/L (ref 22–32)
Calcium: 8.5 mg/dL — ABNORMAL LOW (ref 8.9–10.3)
Chloride: 96 mmol/L — ABNORMAL LOW (ref 98–111)
Creatinine, Ser: 0.65 mg/dL (ref 0.61–1.24)
GFR, Estimated: >60 mL/min (ref >60)
Glucose, Bld: 114 mg/dL — ABNORMAL HIGH (ref 70–99)
Potassium: 4.2 mmol/L (ref 3.5–5.1)
Sodium: 133 mmol/L — ABNORMAL LOW (ref 135–145)

## 2019-12-04 LAB — MAGNESIUM: Magnesium: 2.1 mg/dL (ref 1.7–2.4)

## 2019-12-04 LAB — PHOSPHORUS: Phosphorus: 3.5 mg/dL (ref 2.5–4.6)

## 2019-12-04 MED ORDER — FREE WATER
100.0000 mL | Status: DC
  Administered 2019-12-04 – 2019-12-06 (×5): 100 mL via ORAL

## 2019-12-04 MED ORDER — LACTATED RINGERS IV BOLUS
500.0000 mL | Freq: Once | INTRAVENOUS | Status: AC
  Administered 2019-12-04: 500 mL via INTRAVENOUS

## 2019-12-04 NOTE — Progress Notes (Signed)
Patient ID: Evan [name], male   DOB: 09/04/1950, 69 y.o.   MRN: [id number]  This NP visited patient at the bedside as a follow up for palliative medicine needs and emotional support  Spoke to daughter by telephone, continued conversation regarding current medical situation.  Daughter tells me that her dad is doing so much better that he had "his procedure today, has begun to take solid foods and we have gotten him over the breathing hurdle".  Patient remains high risk for decompensation  Daughter wanted transition of care team to knows that [facility] SNF was facility of choice for rehab when patient is stable for discharge.  This formation was communicated to Kelly/ transition of care team.  Education offered  to family the importance of continued conversation with the  medical providers regarding overall plan of care and treatment options,  ensuring decisions are within the context of the patients values and GOCs.  Education offered on the importance of conversation and documentation of advanced care planning.  This nurse practitioner informed  the family and the attending that I will be out of the hospital until Monday morning.  If the patient is still hospitalized I will follow-up at that time.   Questions and concerns addressed   Discussed with bedside RN and TOC team   Total time spent on the unit was 25 minutes  Greater than 50% of the time was spent in counseling and coordination of care  Wadie Lessen NP  Palliative Medicine Team Team Phone # [phone] Pager [phone]

## 2019-12-04 NOTE — Progress Notes (Signed)
PROGRESS NOTE    Evan [name]  NUU:[id number] DOB: 24-Nov-1950 DOA: 11/21/2019 PCP: Patient, No Pcp Per   Brief Narrative:  69 y.o. 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 Found to have a CVA: needs carotid stents.  Has other issues that will need outpatient follow up: hematuria and dysphagia/wet phonation.  Stent placement was on hold due to significant abnormal breath sounds.   Pulmonary and urology teams were consulted.  He was recommended to continue Foley, bronchodilators were adjusted.  Due to poor nutrition and oral intake, feeding tube placed 11/30/2019. Eventually needs stent placement once his respiratory status stabilizes.   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  Bronchodilators per pulmonary team.  Appreciate their input. -Bronchodilators-Brovana, Pulmicort, Yupelri -Incentive spirometer and flutter valve, Mucinex -Solu-Medrol every 12 hours, slow taper -On  Mucomyst -Scopolamine patch with some improvement in his secretions -Refusing chest vest -Completed 7-day course of Unasyn  Atypical left-sided chest pain, resolved -Resolved. Trop slightly elevated secondary to underlying pulmonary pathology  Leukocytosis -Secondary to steroid  Acute nonhemorrhagic CVA, left temporal occipital region -LDL 154, A1c 6.3.  Echocardiogram EF 55 to 60%.  CTA head and neck did not show LVO but showed severe bilateral CAD stenosis therefore IR consulted for stent placement. -Lipitor 40 mg daily -Currently on Brilinta and aspirin.  Monitor for hematuria -Needs stent placement once his breathing status has improved. Day today assessment.  Essential hypertension -Permissive hypertension  Hematuria, resolved -Suspect from traumatic Foley placement with history of BPH.  Seen by urology, recommend intermittent flushing if necessary.  Foley catheter changed on 11/28/2019  Severe protein calorie malnutrition -Temporary feeding tube placed 11/30/2019.  Speech therapist thinks he would also do better with regular diet therefore I have added regular diet as well.  Out of bed to chair 4+ hours daily  DVT prophylaxis: enoxaparin (LOVENOX) injection 40 mg Start: 11/22/19 1000  Code Status: Full Code Family Communication: Daughter at bedside  Status is: Inpatient  Remains inpatient appropriate because:Hemodynamically unstable   Dispo: The patient is from: SNF              Anticipated d/c is to: SNF              Anticipated d/c date is: > 3 days              Patient currently is not medically stable to d/c.  Still significant abnormal breath sounds awaiting stent placement procedure.  In the meantime addressing his abnormal respiratory status and nutrition    Body mass index  is 17.61 kg/m.     Subjective: Still having some coarse breath sounds this morning but was able to sit in the chair for 6 hours yesterday and his breathing improved in the evening.   Patient and family seems to think that as the day progresses his breathing does improve.  Examination: Constitutional: Appears chronically ill, feeding tube in place, temporal wasting Respiratory: Clear to auscultation bilaterally Cardiovascular: Normal sinus rhythm, no rubs Abdomen: Nontender nondistended good bowel sounds Musculoskeletal: No edema noted Skin: BKA noted Neurologic: CN 2-12 grossly intact.  And nonfocal Psychiatric: Normal judgment and insight. Alert and oriented x 3. Normal mood. Objective: Vitals:   12/04/19 0648 12/04/19 0804 12/04/19 0901 12/04/19 0935  BP: 112/70 103/61  90/64  Pulse: 85 98  96  Resp:  18  17  Temp:  (!) 97.5 F (36.4 C)  (!) 97.3 F (36.3 C)  TempSrc:  Oral  Oral  SpO2:  99% 96% 100%  Weight:      Height:        Intake/Output Summary (Last 24 hours) at 12/04/2019 1034 Last data filed at 12/04/2019 0119 Gross per 24 hour  Intake --  Output 550 ml  Net -550 ml   Filed Weights   11/21/19 1819 12/01/19 0333 12/03/19 0500  Weight: 48.1 kg 51.3 kg 48 kg     Data Reviewed:   CBC: Recent Labs  Lab 11/28/19 0413 12/01/19 2315 12/04/19 0453  WBC 10.6* 19.7* 15.5*  HGB 8.7* 8.2* 8.9*  HCT 26.4* 25.9* 27.8*  MCV 83.5 85.5 86.9  PLT 424* 492* 132*   Basic Metabolic Panel: Recent Labs  Lab 11/30/19 0342 11/30/19 0849 12/01/19 0513 12/01/19 1656 12/01/19 2315 12/03/19 0223 12/04/19 0453  NA 141  --  137  --  134* 132* 133*  K 4.2  --  4.7  --  4.7 4.7 4.2  CL 106  --  102  --  97* 96* 96*  CO2 26  --  23  --  29 26 26   GLUCOSE 147*  --  158*  --  207* 92 114*  BUN 14  --  15  --  16 14 17   CREATININE 0.66  --  0.63  --  0.66 0.52* 0.65  CALCIUM 8.6*  --  8.3*  --  8.2* 8.1* 8.5*  MG  --    < > 2.1 2.1 2.0 2.2 2.1  PHOS  --    < > 3.7 2.9 2.5 3.3 3.5   < > = values in this interval not displayed.   GFR: Estimated Creatinine Clearance: 60 mL/min (by C-G formula based on SCr of 0.65 mg/dL). Liver Function Tests: Recent  Labs  Lab 11/28/19 0413 11/29/19 0334 11/30/19 0342 12/01/19 0513  AST 28 30 28 26   ALT 28 32 34 37  ALKPHOS 78 82 77 81  BILITOT 0.4 0.4 0.4 0.3  PROT 5.3* 5.5* 5.2* 5.3*  ALBUMIN 2.1* 2.1* 2.2* 2.2*   No results for input(s): LIPASE, AMYLASE in the last 168 hours. No results for input(s): AMMONIA in the last 168 hours. Coagulation Profile: Recent Labs  Lab 11/28/19 0413  INR 1.1   Cardiac Enzymes: No results for input(s): CKTOTAL, CKMB, CKMBINDEX, TROPONINI in the last 168 hours. BNP (last 3 results) No results for input(s): PROBNP in the last 8760 hours. HbA1C: No results for input(s): HGBA1C in the last 72 hours. CBG: Recent Labs  Lab 12/03/19 1551 12/03/19 2002 12/04/19 0016 12/04/19 0423 12/04/19 4401  GLUCAP  140* 115* 143* 110* 118*   Lipid Profile: No results for input(s): CHOL, HDL, LDLCALC, TRIG, CHOLHDL, LDLDIRECT in the last 72 hours. Thyroid Function Tests: No results for input(s): TSH, T4TOTAL, FREET4, T3FREE, THYROIDAB in the last 72 hours. Anemia Panel: No results for input(s): VITAMINB12, FOLATE, FERRITIN, TIBC, IRON, RETICCTPCT in the last 72 hours. Sepsis Labs: Recent Labs  Lab 11/28/19 1016  PROCALCITON 0.10    Recent Results (from the past 240 hour(s))  MRSA PCR Screening     Status: None   Collection Time: 11/28/19  4:13 AM   Specimen: Nasal Mucosa; Nasopharyngeal  Result Value Ref Range Status   MRSA by PCR NEGATIVE NEGATIVE Final    Comment:        The GeneXpert MRSA Assay (FDA approved for NASAL specimens only), is one component of a comprehensive MRSA colonization surveillance program. It is not intended to diagnose MRSA infection nor to guide or monitor treatment for MRSA infections. Performed at [facility], [place] [address], [place], [place] [address]          Radiology Studies: No results found.      Scheduled Meds:   stroke: mapping our early stages of recovery book   Does not apply Once    acetylcysteine  4 mL Nebulization BID   arformoterol  15 mcg Nebulization BID   aspirin EC  81 mg Oral Daily   atorvastatin  80 mg Oral Daily   budesonide (PULMICORT) nebulizer solution  0.25 mg Nebulization BID   Chlorhexidine Gluconate Cloth  6 each Topical Daily   enoxaparin (LOVENOX) injection  40 mg Subcutaneous Q24H   feeding supplement  237 mL Oral TID BM   feeding supplement (PROSource TF)  45 mL Per Tube Daily   free water  100 mL Per Tube Q4H   guaiFENesin  600 mg Oral BID   insulin aspart  0-5 Units Subcutaneous QHS   insulin aspart  0-6 Units Subcutaneous Q4H   methylPREDNISolone (SOLU-MEDROL) injection  40 mg Intravenous Q12H   multivitamin with minerals  1 tablet Oral Daily   nicotine  14 mg Transdermal Daily   pantoprazole  40 mg Oral BID   revefenacin  175 mcg Nebulization Daily   scopolamine  1 patch Transdermal Q72H   tamsulosin  0.4 mg Oral QHS   ticagrelor  30 mg Oral BID   Continuous Infusions:  feeding supplement (OSMOLITE 1.2 CAL) Stopped (12/04/19 0114)     LOS: 13 days   Time spent= 35 mins    Kinza Gouveia Arsenio Loader, MD [facility]  If 7PM-7AM, please contact night-coverage  12/04/2019, 10:34 AM

## 2019-12-04 NOTE — Progress Notes (Signed)
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 Estanislado [name] revealing bilateral common carotid artery stenosis- thought to be cause of recent CVA (left-sided weakness- symptomatic from right CCA stenosis); with tentative plans for carotid revascularization on hold secondary to patient's respiratory status. Patient awake and alert laying in bed. No hematuria noted. Still with coarse rhonchi with most breaths- per patient this has slightly improved (less constant), and improves as the day progresses.    Allergies: Chantix [varenicline]  Medications: Prior to Admission medications   Medication Sig Start Date End Date Taking? Authorizing Provider  acetaminophen (TYLENOL) 500 MG tablet Take 500 mg by mouth every 6 (six) hours as needed for mild pain, fever or headache.    Yes [provider]  albuterol (VENTOLIN HFA) 108 (90 Base) MCG/ACT inhaler TAKE 2 PUFFS BY MOUTH EVERY 6 HOURS AS NEEDED FOR WHEEZE OR SHORTNESS OF BREATH Patient taking differently: Inhale 2 puffs into the lungs every 6 (six) hours as needed for wheezing or shortness of breath.  08/30/19  Yes Evan Noel, MD  benzonatate (TESSALON) 100 MG capsule Take 1 capsule (100 mg total) by mouth every 8 (eight) hours. 11/09/19  Yes Evan Rank, MD  Glycopyrrolate-Formoterol (BEVESPI AEROSPHERE) 9-4.8 MCG/ACT AERO Inhale 2 puffs into the lungs 2 (two) times daily. 06/26/19  Yes Evan Noel, MD  guaiFENesin (MUCINEX) 600 MG 12 hr tablet Take 600 mg by mouth 2 (two) times daily as needed for cough.   Yes [provider]  guaiFENesin (ROBITUSSIN) 100 MG/5ML liquid Take 200 mg by mouth 3 (three) times daily as needed for cough.   Yes [provider]  nicotine  (NICODERM CQ - DOSED IN MG/24 HOURS) 14 mg/24hr patch Place 1 patch (14 mg total) onto the skin daily. For 6 weeks.  Then decrease to 7 mg daily. 07/25/19  Yes Evan [name], Evan C, RPH-CPP  tamsulosin (FLOMAX) 0.4 MG CAPS capsule Take 0.4 mg by mouth at bedtime. 10/10/19  Yes [provider]  budesonide (PULMICORT) 0.5 MG/2ML nebulizer solution Take 2 mLs (0.5 mg total) by nebulization 2 (two) times daily. 11/21/19   Evan Ehrich, NP  [employer] ASPIRIN EC 325 MG EC tablet TAKE 1 TABLET BY MOUTH EVERY DAY Patient taking differently: Take 325 mg by mouth daily.  08/16/19   [name], Evan Lav, MD  ipratropium-albuterol (DUONEB) 0.5-2.5 (3) MG/3ML SOLN Take 3 mLs by nebulization every 6 (six) hours as needed. Patient taking differently: Take 3 mLs by nebulization every 6 (six) hours as needed (sob).  11/21/19   Evan Ehrich, NP  nicotine polacrilex (NICORETTE) 4 MG gum Take 1 each (4 mg total) by mouth as needed for smoking cessation. Patient not taking: Reported on 11/15/2019 07/25/19   Evan [name], [employer] C, RPH-CPP     Vital Signs: BP 103/61 (BP Location: Right Arm)   Pulse 98   Temp (!) 97.5 F (36.4 C) (Oral)   Resp 18   Ht 5\' 5"  (1.651 m)   Wt 105 lb 13.1 oz (48 kg)   SpO2 96%   BMI 17.61 kg/m   Physical Exam Vitals and nursing note reviewed.  Constitutional:      General: He is not in acute distress. Cardiovascular:     Rate and Rhythm: Normal rate and regular rhythm.  Heart sounds: Normal heart sounds. No murmur heard.   Pulmonary:     Effort: Pulmonary effort is normal. No respiratory distress.     Breath sounds: Wheezing present.  Musculoskeletal:     Comments: (+) right BKA.  Skin:    General: Skin is warm and dry.  Neurological:     Mental Status: He is alert and oriented to person, place, and time.     Comments: Can spontaneously move all extremities.     Imaging: DG Abd 1 View  Result Date: 11/30/2019 CLINICAL DATA:  Feeding tube placement EXAM: ABDOMEN - 1  VIEW COMPARISON:  None. FINDINGS: Feeding tube passes through the stomach into the duodenum. Feeding tube tip in the second portion duodenum. Water-soluble contrast injected into the tube filling the duodenum and stomach. IMPRESSION: Feeding tube tip second portion of duodenum. Electronically Signed   By: Franchot Gallo M.D.   On: 11/30/2019 14:50    Labs:  CBC: Recent Labs    11/27/19 0431 11/28/19 0413 12/01/19 2315 12/04/19 0453  WBC 12.1* 10.6* 19.7* 15.5*  HGB 10.1* 8.7* 8.2* 8.9*  HCT 31.3* 26.4* 25.9* 27.8*  PLT 440* 424* 492* 558*    COAGS: Recent Labs    11/21/19 2158 11/28/19 0413  INR 1.2 1.1  APTT 28  --     BMP: Recent Labs    10/28/19 1935 10/28/19 1935 10/29/19 0401 10/29/19 0401 10/30/19 0542 10/30/19 0542 10/31/19 0523 11/09/19 2136 12/01/19 0513 12/01/19 2315 12/03/19 0223 12/04/19 0453  NA 136   < > 137   < > 132*   < > 135   < > 137 134* 132* 133*  K 4.2   < > 4.4   < > 4.4   < > 4.8   < > 4.7 4.7 4.7 4.2  CL 100   < > 102   < > 99   < > 99   < > 102 97* 96* 96*  CO2 25   < > 24   < > 25   < > 25   < > 23 29 26 26   GLUCOSE 90   < > 96   < > 150*   < > 141*   < > 158* 207* 92 114*  BUN 10   < > 9   < > 11   < > 13   < > 15 16 14 17   CALCIUM 8.7*   < > 8.8*   < > 8.8*   < > 8.7*   < > 8.3* 8.2* 8.1* 8.5*  CREATININE 0.70   < > 0.81   < > 0.68   < > 0.64   < > 0.63 0.66 0.52* 0.65  GFRNONAA >60   < > >60   < > >60   < > >60   < > >60 >60 >60 >60  GFRAA >60  --  >60  --  >60  --  >60  --   --   --   --   --    < > = values in this interval not displayed.    LIVER FUNCTION TESTS: Recent Labs    11/28/19 0413 11/29/19 0334 11/30/19 0342 12/01/19 0513  BILITOT 0.4 0.4 0.4 0.3  AST 28 30 28 26   ALT 28 32 34 37  ALKPHOS 78 82 77 81  PROT 5.3* 5.5* 5.2* 5.3*  ALBUMIN 2.1* 2.1* 2.2* 2.2*    Assessment and Plan:  History of acute CVA (left  temporooccipital infarct and R>L watershed infarcts) s/p diagnostic cerebral arteriogram 11/23/2019  by Dr. Estanislado [name] revealing bilateral common carotid artery stenosis- thought to be cause of recent CVA (left-sided weakness- symptomatic from right CCA stenosis); with tentative plans for carotid revascularization on hold secondary to patient's respiratory status. Case has been discussed between Dr. Reesa Chew [facility] Dr. Christella Hartigan (anesthesia), and Dr. Estanislado [name]. At this time, patient's respiratory status may be as optimized as possible. He still remains high risk per anesthesia, but at this time he has been cleared for procedure- plan for image-guided cerebral arteriogram with possible revascularization (angioplasty, stent placement) of right CCA stenosis tentatively for tomorrow 12/05/2019 at 1230 in IR with Dr. Estanislado [name] and anesthesia. Patient will be NPO (including tube feeds) at midnight. Afebrile. No hematuria noted. Continue taking Brilinta 30 mg twice daily and Aspirin 81 mg once daily. No need to repeat P2Y12 per Dr. Estanislado [name]. Will hold Lovenox per IR protocol. Further plans per TRH/neurology/CCM/urology- appreciate and agree with management. NIR to follow.  Spoke with patient's son, Evan [name], via telephone at [phone], and also patient's daughter, Evan [name], via telephone at [phone], to discuss updates on patient's procedure. All questions answered and concerns addressed.   Electronically Signed: Earley Abide, PA-C 12/04/2019, 9:20 AM   I spent a total of 15 Minutes at the the patient's bedside AND on the patient's hospital floor or unit, greater than 50% of which was counseling/coordinating care for right common carotid artery stenosis/revascularization.

## 2019-12-04 NOTE — Plan of Care (Signed)
Pt is alert and confused at times. Pt is on 1.5L [place]. TF was stopped at 0114. CHG baths done. Bolus given this morning for a lower than normal BP. BM x3 over night.   Problem: Education: Goal: Knowledge of disease or condition will improve Outcome: Progressing   Problem: Coping: Goal: Will verbalize positive feelings about self Outcome: Progressing Goal: Will identify appropriate support needs Outcome: Progressing   Problem: Health Behavior/Discharge Planning: Goal: Ability to manage health-related needs will improve Outcome: Progressing   Problem: Self-Care: Goal: Ability to participate in self-care as condition permits will improve Outcome: Progressing Goal: Verbalization of feelings and concerns over difficulty with self-care will improve Outcome: Progressing Goal: Ability to communicate needs accurately will improve Outcome: Progressing   Problem: Nutrition: Goal: Risk of aspiration will decrease Outcome: Progressing   Problem: Ischemic Stroke/TIA Tissue Perfusion: Goal: Complications of ischemic stroke/TIA will be minimized Outcome: Progressing   Problem: Education: Goal: Knowledge of General Education information will improve Description: Including pain rating scale, medication(s)/side effects and non-pharmacologic comfort measures Outcome: Progressing   Problem: Health Behavior/Discharge Planning: Goal: Ability to manage health-related needs will improve Outcome: Progressing   Problem: Clinical Measurements: Goal: Ability to maintain clinical measurements within normal limits will improve Outcome: Progressing Goal: Will remain free from infection Outcome: Progressing Goal: Diagnostic test results will improve Outcome: Progressing Goal: Respiratory complications will improve Outcome: Progressing Goal: Cardiovascular complication will be avoided Outcome: Progressing   Problem: Activity: Goal: Risk for activity intolerance will decrease Outcome: Progressing    Problem: Nutrition: Goal: Adequate nutrition will be maintained Outcome: Progressing   Problem: Coping: Goal: Level of anxiety will decrease Outcome: Progressing   Problem: Elimination: Goal: Will not experience complications related to bowel motility Outcome: Progressing Goal: Will not experience complications related to urinary retention Outcome: Progressing   Problem: Pain Managment: Goal: General experience of comfort will improve Outcome: Progressing   Problem: Safety: Goal: Ability to remain free from injury will improve Outcome: Progressing   Problem: Skin Integrity: Goal: Risk for impaired skin integrity will decrease Outcome: Progressing

## 2019-12-04 NOTE — Plan of Care (Signed)
Continue plan of care. Plan for carotid artery stent placement tomorrow: Wednesday.

## 2019-12-04 NOTE — Progress Notes (Signed)
Physical Therapy Treatment Patient Details Name: Evan [name] MRN: [id number] DOB: 02-07-1950 Today's Date: 12/04/2019    History of Present Illness 69 y.o. male with medical history significant of lung cancer apparently stage IIIa status post chemo now in remission, recent hospitalization with recurrent pneumonia, COPD, depression, left renal mass, anxiety disorder, and traumatic R BKA, presents with confusion, weakness, and mild aphasia. CT and MRI demonstrate new left parieto-occipital infarct. On 11/22/2019 pt developing agitation and respiratory distress requiring BiPAP and haldol. Pt underwent bilateral cerebral arteriogram on 11/23/2019 demonstrating severe bilateral common carotid artery stenosis at proximal/mid levels.    PT Comments    Pt very lethargic today which limited session. Max A for sup to sit and pt needed mod with occasional min A to maintain sitting. Losing balance in all directions in sitting today. Pt assisted in donning RLE prosthesis EOB. Attempted sit<>stand 3x with max A achieving partial stand. Did not transfer to chair due to pt lethargy. PT will continue to follow.    Follow Up Recommendations  SNF;Supervision/Assistance - 24 hour     Equipment Recommendations  Other (comment) (mechanical lift if home today)    Recommendations for Other Services       Precautions / Restrictions Precautions Precautions: Fall Precaution Comments: R BKA, prosthesis in room Restrictions Weight Bearing Restrictions: No    Mobility  Bed Mobility Overal bed mobility: Needs Assistance Bed Mobility: Supine to Sit;Sit to Supine     Supine to sit: Max assist Sit to supine: Max assist   General bed mobility comments: mod A to initiate sup to sit, max A for LE's off bed and elevation of trunk into sitting, LOB with initial sitting  Transfers Overall transfer level: Needs assistance Equipment used: None Transfers: Sit to/from Stand Sit to Stand: Max assist          General transfer comment: worked on sit<>stand with prosthesis on. Max A to achieve partial stand. Pt did not appear safe to transfer to chair at this time due to lethargy so spoke with nursing about transfer to chair when he is more alert. They were agreeable  Ambulation/Gait             General Gait Details: unable today   Stairs             Wheelchair Mobility    Modified Rankin (Stroke Patients Only) Modified Rankin (Stroke Patients Only) Pre-Morbid Rankin Score: Slight disability Modified Rankin: Severe disability     Balance Overall balance assessment: Needs assistance Sitting-balance support: Single extremity supported;Feet supported Sitting balance-Leahy Scale: Poor Sitting balance - Comments: worked on [employer] through LUE in sitting but without assist, LUE kept sliding fwd off EOB. Pt losing balance in all directions, consistent min/ mod A needed to maintain sitting. Assisted in donning prosthesis and performing LE ther ex EOB   Standing balance support: During functional activity Standing balance-Leahy Scale: Zero Standing balance comment: max assist for partial stand x3                            Cognition Arousal/Alertness: Lethargic Behavior During Therapy: Flat affect Overall Cognitive Status: Impaired/Different from baseline Area of Impairment: Following commands;Safety/judgement;Awareness;Problem solving                 Orientation Level: Disoriented to;Time;Situation Current Attention Level: Sustained Memory: Decreased recall of precautions;Decreased short-term memory Following Commands: Follows one step commands with increased time Safety/Judgement: Decreased awareness of safety;Decreased awareness  of deficits Awareness: Emergent Problem Solving: Difficulty sequencing;Requires verbal cues;Requires tactile cues General Comments: pt lethargic throughout session today with periods of time that he seemed to suddenly fall asleep and  then would be slightly more alert      Exercises General Exercises - Lower Extremity Long Arc Quad: AROM;10 reps;Seated;Both    General Comments General comments (skin integrity, edema, etc.): SPO2 99% on 2L, HR 100 bpm after session      Pertinent Vitals/Pain Pain Assessment: Faces Faces Pain Scale: Hurts a little bit Pain Location: generalized during mobility Pain Descriptors / Indicators: Discomfort;Grimacing Pain Intervention(s): Limited activity within patient's tolerance;Monitored during session    Home Living                      Prior Function            PT Goals (current goals can now be found in the care plan section) Acute Rehab PT Goals Patient Stated Goal: to improve mobility and activity tolerance PT Goal Formulation: With patient/family Time For Goal Achievement: 12/08/19 Potential to Achieve Goals: Fair Progress towards PT goals: Not progressing toward goals - comment (lethargy)    Frequency    Min 3X/week      PT Plan Current plan remains appropriate    Co-evaluation              AM-PAC PT "6 Clicks" Mobility   Outcome Measure  Help needed turning from your back to your side while in a flat bed without using bedrails?: A Lot Help needed moving from lying on your back to sitting on the side of a flat bed without using bedrails?: A Lot Help needed moving to and from a bed to a chair (including a wheelchair)?: Total Help needed standing up from a chair using your arms (e.g., wheelchair or bedside chair)?: Total Help needed to walk in hospital room?: Total Help needed climbing 3-5 steps with a railing? : Total 6 Click Score: 8    End of Session Equipment Utilized During Treatment: Oxygen;Gait belt Activity Tolerance: Patient limited by lethargy Patient left: with call bell/phone within reach;in bed;with bed alarm set;with family/visitor present Nurse Communication: Mobility status PT Visit Diagnosis: Other abnormalities of gait  and mobility (R26.89);Unsteadiness on feet (R26.81);Muscle weakness (generalized) (M62.81);Other symptoms and signs involving the nervous system (R29.898);Hemiplegia and hemiparesis Hemiplegia - Right/Left: Left Hemiplegia - dominant/non-dominant: Non-dominant Hemiplegia - caused by: Cerebral infarction     Time: 0141-0301 PT Time Calculation (min) (ACUTE ONLY): 27 min  Charges:  $Therapeutic Activity: 23-37 mins                     [place]  Pager [phone] Office [place] 12/04/2019, 1:21 PM

## 2019-12-05 ENCOUNTER — Encounter (HOSPITAL_COMMUNITY)
Admission: EM | Disposition: A | Discharge status: Hospice | Payer: Self-pay | Source: Home / Self Care | Attending: Internal Medicine

## 2019-12-05 ENCOUNTER — Inpatient Hospital Stay (HOSPITAL_COMMUNITY): Payer: Medicare PPO | Admitting: Anesthesiology

## 2019-12-05 ENCOUNTER — Encounter (HOSPITAL_COMMUNITY): Payer: Self-pay | Admitting: Internal Medicine

## 2019-12-05 ENCOUNTER — Other Ambulatory Visit: Payer: Self-pay | Admitting: Interventional Radiology

## 2019-12-05 ENCOUNTER — Inpatient Hospital Stay (HOSPITAL_COMMUNITY): Payer: Medicare PPO

## 2019-12-05 ENCOUNTER — Other Ambulatory Visit: Payer: Self-pay | Admitting: Primary Care

## 2019-12-05 LAB — GLUCOSE, CAPILLARY
Glucose-Capillary: 106 mg/dL — ABNORMAL HIGH (ref 70–99)
Glucose-Capillary: 117 mg/dL — ABNORMAL HIGH (ref 70–99)
Glucose-Capillary: 137 mg/dL — ABNORMAL HIGH (ref 70–99)
Glucose-Capillary: 174 mg/dL — ABNORMAL HIGH (ref 70–99)
Glucose-Capillary: 201 mg/dL — ABNORMAL HIGH (ref 70–99)
Glucose-Capillary: 216 mg/dL — ABNORMAL HIGH (ref 70–99)

## 2019-12-05 LAB — BASIC METABOLIC PANEL
Anion gap: 9 (ref 5–15)
BUN: 14 mg/dL (ref 8–23)
CO2: 30 mmol/L (ref 22–32)
Calcium: 8.7 mg/dL — ABNORMAL LOW (ref 8.9–10.3)
Chloride: 94 mmol/L — ABNORMAL LOW (ref 98–111)
Creatinine, Ser: 0.78 mg/dL (ref 0.61–1.24)
GFR, Estimated: >60 mL/min (ref >60)
Glucose, Bld: 133 mg/dL — ABNORMAL HIGH (ref 70–99)
Potassium: 4.9 mmol/L (ref 3.5–5.1)
Sodium: 133 mmol/L — ABNORMAL LOW (ref 135–145)

## 2019-12-05 LAB — MAGNESIUM: Magnesium: 2.1 mg/dL (ref 1.7–2.4)

## 2019-12-05 LAB — PHOSPHORUS: Phosphorus: 3.9 mg/dL (ref 2.5–4.6)

## 2019-12-05 SURGERY — IR WITH ANESTHESIA
Anesthesia: Monitor Anesthesia Care

## 2019-12-05 MED ORDER — NITROGLYCERIN 1 MG/10 ML FOR IR/CATH LAB
INTRA_ARTERIAL | Status: AC
  Filled 2019-12-05: qty 10

## 2019-12-05 MED ORDER — CHLORHEXIDINE GLUCONATE 0.12 % MT SOLN
15.0000 mL | Freq: Once | OROMUCOSAL | Status: AC
  Administered 2019-12-05: 15 mL via OROMUCOSAL

## 2019-12-05 MED ORDER — LIDOCAINE HCL 1 % IJ SOLN
INTRAMUSCULAR | Status: AC
  Filled 2019-12-05: qty 20

## 2019-12-05 MED ORDER — LACTATED RINGERS IV SOLN: INTRAVENOUS | Status: DC

## 2019-12-05 MED ORDER — HEPARIN SODIUM (PORCINE) 1000 UNIT/ML IJ SOLN
INTRAMUSCULAR | Status: AC
  Filled 2019-12-05: qty 1

## 2019-12-05 MED ORDER — VERAPAMIL HCL 2.5 MG/ML IV SOLN
INTRAVENOUS | Status: AC
  Filled 2019-12-05: qty 2

## 2019-12-05 MED ORDER — ORAL CARE MOUTH RINSE: 15.0000 mL | Freq: Once | OROMUCOSAL | Status: AC

## 2019-12-05 NOTE — Progress Notes (Signed)
Patient ID: Evan [name], male   DOB: Jul 27, 1950, 69 y.o.   MRN: [id number] INR. Patient seen and examined briefly.  Neurologically denies any further neurological symptoms. No change in the Lt UE prox weakness and of the lesser Lt LE weakness.. Alert . oriented to place and time.Responses appropriate. Speech,slurred and weak ,unchanged. Clinically no evidence of labored breathing.  Patient aware of the the procedure ,reasons,and alternatives.. Risks of a new stroke,with potential worsening of neurostatus,needing intubation ,death and inability to revascularize. Informed  Witnessed consent obtained. S.Hasheem Voland MD

## 2019-12-05 NOTE — Plan of Care (Signed)
  Problem: Education: Goal: Knowledge of disease or condition will improve Outcome: Progressing   Problem: Skin Integrity: Goal: Risk for impaired skin integrity will decrease Outcome: Progressing   Problem: Safety: Goal: Ability to remain free from injury will improve Outcome: Progressing   Problem: Pain Managment: Goal: General experience of comfort will improve Outcome: Progressing   Problem: Coping: Goal: Level of anxiety will decrease Outcome: Progressing   Problem: Clinical Measurements: Goal: Respiratory complications will improve Outcome: Progressing

## 2019-12-05 NOTE — Progress Notes (Signed)
SLP Cancellation Note  Patient Details Name: Evan [name] MRN: [id number] DOB: 26-Sep-1950   Cancelled treatment:       Reason Eval/Treat Not Completed: Patient at procedure or test/unavailable. NPO for procedure   Jacklynn Dehaas, Katherene Ponto 12/05/2019, 10:13 AM

## 2019-12-05 NOTE — H&P (Signed)
HPI:  The patient has had a H&P performed within the last 30 days, all history, medications, and exam have been reviewed. The patient denies any interval changes since the H&P.  Medications: Prior to Admission medications   Medication Sig Start Date End Date Taking? Authorizing Provider  acetaminophen (TYLENOL) 500 MG tablet Take 500 mg by mouth every 6 (six) hours as needed for mild pain, fever or headache.    Yes [provider]  albuterol (VENTOLIN HFA) 108 (90 Base) MCG/ACT inhaler TAKE 2 PUFFS BY MOUTH EVERY 6 HOURS AS NEEDED FOR WHEEZE OR SHORTNESS OF BREATH Patient taking differently: Inhale 2 puffs into the lungs every 6 (six) hours as needed for wheezing or shortness of breath.  08/30/19  Yes Rigoberto Noel, MD  benzonatate (TESSALON) 100 MG capsule Take 1 capsule (100 mg total) by mouth every 8 (eight) hours. 11/09/19  Yes Dorie Rank, MD  Glycopyrrolate-Formoterol (BEVESPI AEROSPHERE) 9-4.8 MCG/ACT AERO Inhale 2 puffs into the lungs 2 (two) times daily. 06/26/19  Yes Rigoberto Noel, MD  guaiFENesin (MUCINEX) 600 MG 12 hr tablet Take 600 mg by mouth 2 (two) times daily as needed for cough.   Yes [provider]  guaiFENesin (ROBITUSSIN) 100 MG/5ML liquid Take 200 mg by mouth 3 (three) times daily as needed for cough.   Yes [provider]  nicotine (NICODERM CQ - DOSED IN MG/24 HOURS) 14 mg/24hr patch Place 1 patch (14 mg total) onto the skin daily. For 6 weeks.  Then decrease to 7 mg daily. 07/25/19  Yes Yopp, Amber C, RPH-CPP  tamsulosin (FLOMAX) 0.4 MG CAPS capsule Take 0.4 mg by mouth at bedtime. 10/10/19  Yes [provider]  budesonide (PULMICORT) 0.5 MG/2ML nebulizer solution Take 2 mLs (0.5 mg total) by nebulization 2 (two) times daily. 11/21/19   Martyn Ehrich, NP  [employer] ASPIRIN EC 325 MG EC tablet TAKE 1 TABLET BY MOUTH EVERY DAY Patient taking differently: Take 325 mg by mouth daily.  08/16/19   Vaslow, Acey Lav, MD  ipratropium-albuterol  (DUONEB) 0.5-2.5 (3) MG/3ML SOLN Take 3 mLs by nebulization every 6 (six) hours as needed. Patient taking differently: Take 3 mLs by nebulization every 6 (six) hours as needed (sob).  11/21/19   Martyn Ehrich, NP  nicotine polacrilex (NICORETTE) 4 MG gum Take 1 each (4 mg total) by mouth as needed for smoking cessation. Patient not taking: Reported on 11/15/2019 07/25/19   Yopp, [employer] C, RPH-CPP     Vital Signs: BP 110/83 (BP Location: Right Arm)   Pulse 80   Temp 98.6 F (37 C) (Oral)   Resp 16   Ht 5\' 5"  (1.651 m)   Wt 100 lb 15.5 oz (45.8 kg)   SpO2 98%   BMI 16.80 kg/m   Physical Exam Vitals and nursing note reviewed.  Constitutional:      General: He is not in acute distress. Cardiovascular:     Rate and Rhythm: Normal rate and regular rhythm.     Heart sounds: Normal heart sounds. No murmur heard.   Pulmonary:     Effort: Pulmonary effort is normal. No respiratory distress.     Breath sounds: Wheezing present.  Musculoskeletal:     Comments: (+) right BKA.   Skin:    General: Skin is warm and dry.  Neurological:     Mental Status: He is alert and oriented to person, place, and time.     Comments: Can spontaneously move all extremities.  Mallampati Score:  MD Evaluation Airway: WNL Heart: WNL Abdomen: WNL Chest/ Lungs: WNL ASA  Classification: 3 Mallampati/Airway Score: Two  Labs:  CBC: Recent Labs    11/27/19 0431 11/28/19 0413 12/01/19 2315 12/04/19 0453  WBC 12.1* 10.6* 19.7* 15.5*  HGB 10.1* 8.7* 8.2* 8.9*  HCT 31.3* 26.4* 25.9* 27.8*  PLT 440* 424* 492* 558*    COAGS: Recent Labs    11/21/19 2158 11/28/19 0413  INR 1.2 1.1  APTT 28  --     BMP: Recent Labs    10/28/19 1935 10/28/19 1935 10/29/19 0401 10/29/19 0401 10/30/19 0542 10/30/19 0542 10/31/19 0523 11/09/19 2136 12/01/19 2315 12/03/19 0223 12/04/19 0453 12/05/19 0445  NA 136   < > 137   < > 132*   < > 135   < > 134* 132* 133* 133*  K 4.2   < > 4.4   < >  4.4   < > 4.8   < > 4.7 4.7 4.2 4.9  CL 100   < > 102   < > 99   < > 99   < > 97* 96* 96* 94*  CO2 25   < > 24   < > 25   < > 25   < > 29 26 26 30   GLUCOSE 90   < > 96   < > 150*   < > 141*   < > 207* 92 114* 133*  BUN 10   < > 9   < > 11   < > 13   < > 16 14 17 14   CALCIUM 8.7*   < > 8.8*   < > 8.8*   < > 8.7*   < > 8.2* 8.1* 8.5* 8.7*  CREATININE 0.70   < > 0.81   < > 0.68   < > 0.64   < > 0.66 0.52* 0.65 0.78  GFRNONAA >60   < > >60   < > >60   < > >60   < > >60 >60 >60 >60  GFRAA >60  --  >60  --  >60  --  >60  --   --   --   --   --    < > = values in this interval not displayed.    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 Estanislado Pandy revealing bilateral common carotid artery stenosis- thought to be cause of recent CVA (left-sided weakness- symptomatic from right CCA stenosis); with tentative plans for carotid revascularization on hold secondary to patient's respiratory status. Plan for image-guided cerebral arteriogram with possible revascularization (angioplasty, stent placement) of right CCA stenosis tentatively for today at 50 in IR with Dr. Estanislado Pandy and anesthesia. Patient is NPO. Afebrile. Ok to proceed with Brilinta/Aspirin use per Dr. Estanislado Pandy. INR 1.1 11/28/2019. Further plans per TRH/neurology/CCM/urology- appreciate and agree with management. NIR to follow.   Signed: Earley Abide 12/05/2019, 10:32 AM

## 2019-12-05 NOTE — Anesthesia Preprocedure Evaluation (Deleted)
Anesthesia Evaluation  Patient identified by MRN, date of birth, ID band Patient awake    Reviewed: Allergy & Precautions, NPO status , Patient's Chart, lab work & pertinent test results  Airway Mallampati: III  TM Distance: >3 FB Neck ROM: Full    Dental  (+) Teeth Intact, Dental Advisory Given   Pulmonary COPD, former smoker,     + decreased breath sounds      Cardiovascular hypertension,  Rhythm:Regular Rate:Normal     Neuro/Psych  Headaches, PSYCHIATRIC DISORDERS Anxiety Depression CVA    GI/Hepatic negative GI ROS, Neg liver ROS,   Endo/Other  negative endocrine ROS  Renal/GU negative Renal ROS     Musculoskeletal negative musculoskeletal ROS (+)   Abdominal Normal abdominal exam  (+)   Peds  Hematology negative hematology ROS (+)   Anesthesia Other Findings   Reproductive/Obstetrics                            Anesthesia Physical Anesthesia Plan  ASA: III  Anesthesia Plan: MAC   Post-op Pain Management:    Induction: Intravenous  PONV Risk Score and Plan: 0 and Propofol infusion  Airway Management Planned: Natural Airway and Simple Face Mask  Additional Equipment: Arterial line  Intra-op Plan:   Post-operative Plan:   Informed Consent: I have reviewed the patients History and Physical, chart, labs and discussed the procedure including the risks, benefits and alternatives for the proposed anesthesia with the patient or authorized representative who has indicated his/her understanding and acceptance.       Plan Discussed with: CRNA  Anesthesia Plan Comments: (Postponed due to emergent IR case  Lab Results      Component                Value               Date                      WBC                      15.5 (H)            12/04/2019                HGB                      8.9 (L)             12/04/2019                HCT                      27.8 (L)             12/04/2019                MCV                      86.9                12/04/2019                PLT                      558 (H)             12/04/2019  Lab Results      Component                Value               Date                      CREATININE               0.78                12/05/2019                BUN                      14                  12/05/2019                NA                       133 (L)             12/05/2019                K                        4.9                 12/05/2019                CL                       94 (L)              12/05/2019                CO2                      30                  12/05/2019            Echo: 1. Left ventricular ejection fraction, by estimation, is 55 to 60%. The  left ventricle has normal function. The left ventricle has no regional  wall motion abnormalities. Left ventricular diastolic parameters are  consistent with Grade I diastolic  dysfunction (impaired relaxation). Elevated left atrial pressure.  2. Right ventricular systolic function is normal. The right ventricular  size is normal.  3. The mitral valve is normal in structure. Trivial mitral valve  regurgitation. No evidence of mitral stenosis.  4. The aortic valve is normal in structure. Aortic valve regurgitation is  not visualized. No aortic stenosis is present.  5. The inferior vena cava is normal in size with greater than 50%  respiratory variability, suggesting right atrial pressure of 3 mmHg. )      Anesthesia Quick Evaluation

## 2019-12-05 NOTE — Progress Notes (Signed)
Lamar Laundry, PA with Dr. Estanislado Pandy requested patient be sent back to floor with arterial line in place. 3W RN contacted with report and stated floor does not accept arterial lines. Aly made aware and stated to remove arterial line and transfer patient back to 3W. Charge CRNA made aware, removed a-line. Cleatis Polka, RN called with report, verbalized understanding. Transferred patient back to 3W.

## 2019-12-05 NOTE — Progress Notes (Signed)
Received back in bed from IR without having procedure done, as he was bumped because of an emergent code stroke.  Pt and daughter very understandable.  No signs of distress.

## 2019-12-05 NOTE — Progress Notes (Addendum)
NIR.  Patient was scheduled for an image-guided cerebral arteriogram with possible revascularization (angioplasty, stent placement) of right CCA stenosis in IR tentatively for today with Dr. Estanislado Pandy.  Unfortunately, there was an emergent code stroke procedure that required Dr. Arlean Hopping immediate attention at this time. Because of this, procedure will not occur today and will be postponed until tomorrow 12/06/2019. Plan for image-guided cerebral arteriogram with possible revascularization (angioplasty, stent placement) of right CCA stenosis tentatively for tomorrow at 1230 in IR with Dr. Estanislado Pandy and anesthesia pending IR scheduling. Patient will be NPO at midnight. Dr. Pietro Cassis made aware.  Dr. Estanislado Pandy informed patient of above. Also spoke with patient's son, Evan [name], via telephone for updates as well. All questions answered and concerns addressed.  NIR to follow.   ADDENDUM: Plan for procedure tomorrow in IR at 0830 (not at 1230 like previously mentioned).   Evan Graff Jay Haskew, PA-C 12/05/2019, 3:07 PM

## 2019-12-05 NOTE — Progress Notes (Signed)
PROGRESS NOTE  Evan [name]  DOB: 05-16-1950  PCP: Patient, No Pcp Per YYT:[id number]  DOA: 11/21/2019  LOS: 14 days   Chief complaint: Confusion, weakness  Brief narrative: Evan [name] is a 69 y.o. 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 Patient presented to the ED on 10/20 with significant confusion, weakness for 24 hours.  He was found to have CVA with carotid stenosis and currently waiting for stenting. Stent placement was on hold due to significant abnormal breath sounds. His respiratory status subsequently improved.   11/3, he was scheduled for stent placement but it got postponed because of another emergency case.   Due to poor nutrition and oral intake, feeding tube placed 11/30/2019.   Subjective: Patient was seen and examined this morning. Pleasant elderly African-American male.  Propped up in bed.  Has core track tube in place.  Feeding was on hold this morning. Daughter at bedside.  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   -Imaging showed acute ischemic infarct involving the left temporoccipital region, -LDL 154, A1c 6.3.  Echocardiogram EF 55 to 60%.  CTA head and neck did not show LVO but showed severe bilateral carotid artery stenosis therefore IR consulted for stent placement. -Currently on Brilinta, aspirin and Lipitor 40 mg daily. -Unable to do carotid stenting today.  Tentatively plan for tomorrow.  N.p.o. after midnight.  Acute respiratory failure with hypoxia History of COPD Stage III lung cancer -During this hospitalization, patient had bilateral diffuse coarse breath sounds from  aspiration pneumonia and upper airway secretion.  Because of worsening respiratory status, carotid stenting was postponed.  -Chest x-ray did not show any infiltrate.  Respiratory status much better today. -Continue bronchodilators, scopolamine patch. -Incentive spirometer and flutter valve, Mucinex -Solu-Medrol every 12 hours, slow taper -On Mucomyst -Refusing chest vest -Completed 7-day course of Unasyn  Atypical left-sided chest pain, resolved -Resolved. Trop was slightly elevated secondary to underlying pulmonary pathology  Leukocytosis -Secondary to steroid Recent Labs  Lab 12/01/19 2315 12/04/19 0453  WBC 19.7* 15.5*   Essential hypertension -Blood pressure currently controlled without meds.  Hematuria, resolved -Suspect from traumatic Foley placement with history of BPH.  Seen by urology, recommended intermittent flushing if necessary.  Foley catheter changed on 11/28/2019  Severe protein calorie malnutrition -Temporary feeding tube placed 11/30/2019.  -Also on regular diet.  If oral intake does not improve, may need PEG tube placement.  Mobility: PT evaluation appreciated Code Status:   Code Status: Full Code  Nutritional status: Body mass index is 16.8 kg/m. Nutrition Problem: Severe Malnutrition Etiology: chronic illness, cancer and cancer related treatments Signs/Symptoms: severe muscle depletion, severe fat depletion Percent weight loss: 19.8 % (x7 months) Diet Order            Diet NPO time specified Except for: Sips with Meds  Diet effective midnight           Diet regular Room service appropriate? Yes with Assist; Fluid consistency: Thin  Diet effective now                 DVT prophylaxis: enoxaparin (LOVENOX) injection 40 mg Start: 11/22/19 1000   Antimicrobials:  Completed the course Fluid: None Consultants: Neuroradiology Family Communication:  Daughter at  bedside  Status is: Inpatient  Remains inpatient appropriate because, pending  stent placement today   Dispo: The patient is from: Home              Anticipated d/c is to: SNF recommended by PT              Anticipated d/c date is: 2 to 3 days              Patient currently is not medically stable to d/c.   Infusions:  . feeding supplement (OSMOLITE 1.2 CAL) Stopped (12/05/19 0010)    Scheduled Meds: .  stroke: mapping our early stages of recovery book   Does not apply Once  . arformoterol  15 mcg Nebulization BID  . aspirin EC  81 mg Oral Daily  . atorvastatin  80 mg Oral Daily  . budesonide (PULMICORT) nebulizer solution  0.25 mg Nebulization BID  . Chlorhexidine Gluconate Cloth  6 each Topical Daily  . enoxaparin (LOVENOX) injection  40 mg Subcutaneous Q24H  . feeding supplement  237 mL Oral TID BM  . feeding supplement (PROSource TF)  45 mL Per Tube Daily  . free water  100 mL Oral Q4H  . guaiFENesin  600 mg Oral BID  . insulin aspart  0-5 Units Subcutaneous QHS  . insulin aspart  0-6 Units Subcutaneous Q4H  . methylPREDNISolone (SOLU-MEDROL) injection  40 mg Intravenous Q12H  . multivitamin with minerals  1 tablet Oral Daily  . nicotine  14 mg Transdermal Daily  . nitroGLYCERIN      . pantoprazole  40 mg Oral BID  . revefenacin  175 mcg Nebulization Daily  . scopolamine  1 patch Transdermal Q72H  . tamsulosin  0.4 mg Oral QHS  . ticagrelor  30 mg Oral BID    Antimicrobials: Anti-infectives (From admission, onward)   Start     Dose/Rate Route Frequency Ordered Stop   11/28/19 0000  ceFAZolin (ANCEF) IVPB 2g/100 mL premix  Status:  Discontinued        2 g 200 mL/hr over 30 Minutes Intravenous 60 min pre-op 11/27/19 1730 11/28/19 0857   11/28/19 0000  ceFAZolin (ANCEF) IVPB 2g/100 mL premix  Status:  Discontinued        2 g 200 mL/hr over 30 Minutes Intravenous 60 min pre-op 11/27/19 1730 11/27/19 1733   11/25/19 1530  Ampicillin-Sulbactam (UNASYN) 3 g in sodium chloride 0.9 % 100 mL IVPB        3 g 200 mL/hr over 30 Minutes Intravenous Every  8 hours 11/25/19 1432 12/02/19 2359      PRN meds: acetaminophen **OR** acetaminophen (TYLENOL) oral liquid 160 mg/5 mL **OR** acetaminophen, albuterol, hydrOXYzine, LORazepam, senna-docusate   Objective: Vitals:   12/05/19 0813 12/05/19 0815  BP:    Pulse:    Resp:    Temp:    SpO2: 98% 98%    Intake/Output Summary (Last 24 hours) at 12/05/2019 1650 Last data filed at 12/05/2019 0345 Gross per 24 hour  Intake --  Output 2100 ml  Net -2100 ml   Filed Weights   12/01/19 0333 12/03/19 0500 12/05/19 0500  Weight: 51.3 kg 48 kg 45.8 kg   Weight change:  Body mass index is 16.8 kg/m.   Physical Exam: General exam: Appears calm and comfortable.  Not in physical distress Skin: No rashes, lesions or ulcers. HEENT: Atraumatic, normocephalic, supple neck, no obvious bleeding.  Core track in place Lungs: Clear to auscultation bilaterally CVS: Regular rate and  rhythm, no murmur GI/Abd soft, nontender, nondistended, bowel sound present CNS: Alert, awake, oriented x3 Psychiatry: Mood appropriate Extremities: No pedal edema, no calf tenderness  Data Review: I have personally reviewed the laboratory data and studies available.  Recent Labs  Lab 12/01/19 2315 12/04/19 0453  WBC 19.7* 15.5*  HGB 8.2* 8.9*  HCT 25.9* 27.8*  MCV 85.5 86.9  PLT 492* 558*   Recent Labs  Lab 12/01/19 0513 12/01/19 0513 12/01/19 1656 12/01/19 2315 12/03/19 0223 12/04/19 0453 12/05/19 0445  NA 137  --   --  134* 132* 133* 133*  K 4.7  --   --  4.7 4.7 4.2 4.9  CL 102  --   --  97* 96* 96* 94*  CO2 23  --   --  29 26 26 30   GLUCOSE 158*  --   --  207* 92 114* 133*  BUN 15  --   --  16 14 17 14   CREATININE 0.63  --   --  0.66 0.52* 0.65 0.78  CALCIUM 8.3*  --   --  8.2* 8.1* 8.5* 8.7*  MG 2.1   < > 2.1 2.0 2.2 2.1 2.1  PHOS 3.7   < > 2.9 2.5 3.3 3.5 3.9   < > = values in this interval not displayed.    F/u labs ordered.  Signed, Terrilee Croak, MD [facility] 12/05/2019

## 2019-12-06 ENCOUNTER — Encounter (HOSPITAL_COMMUNITY): Payer: Self-pay | Admitting: Internal Medicine

## 2019-12-06 ENCOUNTER — Inpatient Hospital Stay (HOSPITAL_COMMUNITY): Payer: Medicare PPO

## 2019-12-06 ENCOUNTER — Inpatient Hospital Stay (HOSPITAL_COMMUNITY): Payer: Medicare PPO | Admitting: Certified Registered"

## 2019-12-06 ENCOUNTER — Encounter (HOSPITAL_COMMUNITY)
Admission: EM | Disposition: A | Discharge status: Hospice | Payer: Self-pay | Source: Home / Self Care | Attending: Internal Medicine

## 2019-12-06 DIAGNOSIS — E43 Unspecified severe protein-calorie malnutrition: Secondary | ICD-10-CM

## 2019-12-06 DIAGNOSIS — I771 Stricture of artery: Secondary | ICD-10-CM

## 2019-12-06 DIAGNOSIS — R531 Weakness: Secondary | ICD-10-CM

## 2019-12-06 DIAGNOSIS — I6521 Occlusion and stenosis of right carotid artery: Secondary | ICD-10-CM | POA: Diagnosis present

## 2019-12-06 HISTORY — PX: IR INTRAVSC STENT CERV CAROTID W/O EMB-PROT MOD SED INC ANGIO: IMG2304

## 2019-12-06 HISTORY — PX: RADIOLOGY WITH ANESTHESIA: SHX6223

## 2019-12-06 LAB — GLUCOSE, CAPILLARY
Glucose-Capillary: 103 mg/dL — ABNORMAL HIGH (ref 70–99)
Glucose-Capillary: 120 mg/dL — ABNORMAL HIGH (ref 70–99)
Glucose-Capillary: 130 mg/dL — ABNORMAL HIGH (ref 70–99)
Glucose-Capillary: 132 mg/dL — ABNORMAL HIGH (ref 70–99)
Glucose-Capillary: 139 mg/dL — ABNORMAL HIGH (ref 70–99)
Glucose-Capillary: 161 mg/dL — ABNORMAL HIGH (ref 70–99)

## 2019-12-06 LAB — POCT ACTIVATED CLOTTING TIME
Activated Clotting Time: 197 seconds
Activated Clotting Time: 219 seconds
Activated Clotting Time: 219 seconds

## 2019-12-06 LAB — CBC
HCT: 26 % — ABNORMAL LOW (ref 39.0–52.0)
Hemoglobin: 8.4 g/dL — ABNORMAL LOW (ref 13.0–17.0)
MCH: 28.3 pg (ref 26.0–34.0)
MCHC: 32.3 g/dL (ref 30.0–36.0)
MCV: 87.5 fL (ref 80.0–100.0)
Platelets: 526 10*3/uL — ABNORMAL HIGH (ref 150–400)
RBC: 2.97 MIL/uL — ABNORMAL LOW (ref 4.22–5.81)
RDW: 19.7 % — ABNORMAL HIGH (ref 11.5–15.5)
WBC: 20.5 10*3/uL — ABNORMAL HIGH (ref 4.0–10.5)
nRBC: 0 % (ref 0.0–0.2)

## 2019-12-06 LAB — BASIC METABOLIC PANEL
Anion gap: 10 (ref 5–15)
BUN: 16 mg/dL (ref 8–23)
CO2: 25 mmol/L (ref 22–32)
Calcium: 8.3 mg/dL — ABNORMAL LOW (ref 8.9–10.3)
Chloride: 95 mmol/L — ABNORMAL LOW (ref 98–111)
Creatinine, Ser: 0.66 mg/dL (ref 0.61–1.24)
GFR, Estimated: >60 mL/min (ref >60)
Glucose, Bld: 165 mg/dL — ABNORMAL HIGH (ref 70–99)
Potassium: 4.8 mmol/L (ref 3.5–5.1)
Sodium: 130 mmol/L — ABNORMAL LOW (ref 135–145)

## 2019-12-06 LAB — HEPARIN LEVEL (UNFRACTIONATED): Heparin Unfractionated: <0.1 IU/mL — ABNORMAL LOW (ref 0.30–0.70)

## 2019-12-06 LAB — PHOSPHORUS: Phosphorus: 3.8 mg/dL (ref 2.5–4.6)

## 2019-12-06 LAB — MAGNESIUM: Magnesium: 2 mg/dL (ref 1.7–2.4)

## 2019-12-06 SURGERY — IR WITH ANESTHESIA
Anesthesia: Monitor Anesthesia Care

## 2019-12-06 MED ORDER — NITROGLYCERIN 1 MG/10 ML FOR IR/CATH LAB
INTRA_ARTERIAL | Status: AC
  Filled 2019-12-06: qty 10

## 2019-12-06 MED ORDER — PREDNISOLONE 5 MG PO TABS
40.0000 mg | ORAL_TABLET | Freq: Every day | ORAL | Status: DC
  Administered 2019-12-07: 40 mg via ORAL
  Administered 2019-12-08: 30 mg via ORAL
  Administered 2019-12-09: 40 mg via ORAL
  Filled 2019-12-06 (×3): qty 8

## 2019-12-06 MED ORDER — OXYCODONE HCL 5 MG/5ML PO SOLN: 5.0000 mg | Freq: Once | ORAL | Status: DC | PRN

## 2019-12-06 MED ORDER — ASPIRIN 81 MG PO CHEW
81.0000 mg | CHEWABLE_TABLET | Freq: Every day | ORAL | Status: DC
  Filled 2019-12-06: qty 1

## 2019-12-06 MED ORDER — ASPIRIN 81 MG PO CHEW
81.0000 mg | CHEWABLE_TABLET | Freq: Every day | ORAL | Status: DC
  Administered 2019-12-07: 81 mg via ORAL
  Filled 2019-12-06 (×2): qty 1

## 2019-12-06 MED ORDER — FENTANYL CITRATE (PF) 250 MCG/5ML IJ SOLN
INTRAMUSCULAR | Status: DC | PRN
  Administered 2019-12-06 (×2): 50 ug via INTRAVENOUS

## 2019-12-06 MED ORDER — IOHEXOL 300 MG/ML  SOLN
150.0000 mL | Freq: Once | INTRAMUSCULAR | Status: AC | PRN
  Administered 2019-12-06: 75 mL via INTRA_ARTERIAL

## 2019-12-06 MED ORDER — LIDOCAINE HCL 1 % IJ SOLN
INTRAMUSCULAR | Status: AC | PRN
  Administered 2019-12-06: 10 mL

## 2019-12-06 MED ORDER — SODIUM CHLORIDE 0.9 % IV SOLN: INTRAVENOUS | Status: DC

## 2019-12-06 MED ORDER — FENTANYL CITRATE (PF) 100 MCG/2ML IJ SOLN: 25.0000 ug | INTRAMUSCULAR | Status: DC | PRN

## 2019-12-06 MED ORDER — SODIUM CHLORIDE 0.9 % IV SOLN: INTRAVENOUS | Status: DC | PRN

## 2019-12-06 MED ORDER — PHENYLEPHRINE 40 MCG/ML (10ML) SYRINGE FOR IV PUSH (FOR BLOOD PRESSURE SUPPORT)
PREFILLED_SYRINGE | INTRAVENOUS | Status: DC | PRN
  Administered 2019-12-06: 80 ug via INTRAVENOUS

## 2019-12-06 MED ORDER — HEPARIN (PORCINE) 25000 UT/250ML-% IV SOLN
INTRAVENOUS | Status: AC
  Filled 2019-12-06: qty 250

## 2019-12-06 MED ORDER — CHLORHEXIDINE GLUCONATE 0.12 % MT SOLN
OROMUCOSAL | Status: AC
  Filled 2019-12-06: qty 15

## 2019-12-06 MED ORDER — ACETAMINOPHEN 325 MG PO TABS: 650.0000 mg | ORAL_TABLET | ORAL | Status: DC | PRN

## 2019-12-06 MED ORDER — ONDANSETRON HCL 4 MG/2ML IJ SOLN: 4.0000 mg | Freq: Four times a day (QID) | INTRAMUSCULAR | Status: DC | PRN

## 2019-12-06 MED ORDER — HEPARIN (PORCINE) 25000 UT/250ML-% IV SOLN: 550.0000 [IU]/h | INTRAVENOUS | Status: DC

## 2019-12-06 MED ORDER — IOHEXOL 300 MG/ML  SOLN
150.0000 mL | Freq: Once | INTRAMUSCULAR | Status: AC | PRN
  Administered 2019-12-06: 50 mL via INTRA_ARTERIAL

## 2019-12-06 MED ORDER — CHLORHEXIDINE GLUCONATE 0.12 % MT SOLN
15.0000 mL | Freq: Once | OROMUCOSAL | Status: AC
  Administered 2019-12-16: 15 mL via OROMUCOSAL
  Filled 2019-12-06 (×3): qty 15

## 2019-12-06 MED ORDER — HEPARIN (PORCINE) 25000 UT/250ML-% IV SOLN
450.0000 [IU]/h | INTRAVENOUS | Status: DC
  Administered 2019-12-06: 500 [IU]/h via INTRAVENOUS
  Administered 2019-12-06: 450 [IU]/h via INTRAVENOUS

## 2019-12-06 MED ORDER — CLEVIDIPINE BUTYRATE 0.5 MG/ML IV EMUL: 0.0000 mg/h | INTRAVENOUS | Status: AC

## 2019-12-06 MED ORDER — TICAGRELOR 60 MG PO TABS
30.0000 mg | ORAL_TABLET | Freq: Two times a day (BID) | ORAL | Status: DC
  Filled 2019-12-06 (×14): qty 1

## 2019-12-06 MED ORDER — ACETAMINOPHEN 160 MG/5ML PO SOLN: 650.0000 mg | ORAL | Status: DC | PRN

## 2019-12-06 MED ORDER — ACETAMINOPHEN 650 MG RE SUPP: 650.0000 mg | RECTAL | Status: DC | PRN

## 2019-12-06 MED ORDER — HEPARIN SODIUM (PORCINE) 1000 UNIT/ML IJ SOLN
INTRAMUSCULAR | Status: DC | PRN
  Administered 2019-12-06: 1000 [IU] via INTRAVENOUS
  Administered 2019-12-06: 500 [IU] via INTRAVENOUS
  Administered 2019-12-06: 2000 [IU] via INTRAVENOUS

## 2019-12-06 MED ORDER — LIDOCAINE HCL 1 % IJ SOLN
INTRAMUSCULAR | Status: AC
  Filled 2019-12-06: qty 20

## 2019-12-06 MED ORDER — TICAGRELOR 60 MG PO TABS
30.0000 mg | ORAL_TABLET | Freq: Two times a day (BID) | ORAL | Status: DC
  Administered 2019-12-06 – 2019-12-11 (×11): 30 mg via ORAL
  Filled 2019-12-06 (×14): qty 1

## 2019-12-06 MED ORDER — OXYCODONE HCL 5 MG PO TABS: 5.0000 mg | ORAL_TABLET | Freq: Once | ORAL | Status: DC | PRN

## 2019-12-06 NOTE — Sedation Documentation (Signed)
Patient transported to PACU with CRNA. Bedside report given to [place] RN-PACU. Bilateral groin sites assessed. Pink warm and dry, no drainage noted, soft to palpation. Distal pulses on the Left intact

## 2019-12-06 NOTE — Plan of Care (Signed)
  Problem: Education: Goal: Knowledge of disease or condition will improve Outcome: Progressing   Problem: Pain Managment: Goal: General experience of comfort will improve Outcome: Progressing   Problem: Safety: Goal: Ability to remain free from injury will improve Outcome: Progressing   Problem: Skin Integrity: Goal: Risk for impaired skin integrity will decrease Outcome: Progressing   Problem: Coping: Goal: Level of anxiety will decrease Outcome: Progressing

## 2019-12-06 NOTE — Transfer of Care (Signed)
Immediate Anesthesia Transfer of Care Note  Patient: Evan [name]  Procedure(s) Performed: STENT PLACMENT (N/A )  Patient Location: PACU  Anesthesia Type:MAC  Level of Consciousness: awake, alert  and oriented  Airway & Oxygen Therapy: Patient Spontanous Breathing and Patient connected to nasal cannula oxygen  Post-op Assessment: Report given to RN, Post -op Vital signs reviewed and stable and Patient moving all extremities X 4  Post vital signs: Reviewed and stable  Last Vitals:  Vitals Value Taken Time  BP 117/75 12/06/19 1200  Temp    Pulse 103 12/06/19 1202  Resp 15 12/06/19 1202  SpO2 99 % 12/06/19 1202  Vitals shown include unvalidated device data.  Last Pain:  Vitals:   12/06/19 0328  TempSrc: Oral  PainSc: 0-No pain      Patients Stated Pain Goal: 0 (43/83/81 8403)  Complications: No complications documented.

## 2019-12-06 NOTE — Progress Notes (Addendum)
Patient arrived to 4N19 around 1255. Patient alert and oriented at this time.  Right groin site level zero.  Left groin site with some old blood on gauze, marked and will continue to monitor.  Patient educated on importance of keeping leg straight.    Belongings bought from 3W.  Belongings include prosthetic, shoe, pants, and cell phone charger.  Cell phone with son.  Son updated and visiting at this time.      1340:  Dr. Estanislado Pandy paged regarding blood pressure goal of 120-140.  Patient's systolic blood pressure running in the 100s.  Per report this was baseline.  Discussed with Dr. Estanislado Pandy and blood pressure goal, per Dr. Estanislado Pandy, if patient is clinically stable he is ok with systolic blood pressure in the 100s.  RN to continue to monitor.   1630:  Dr. Estanislado Pandy and PA at bedside assessing patient.  Left groin dressing removed by PA, no bleeding noted.  New dressing reapplied by IR PA.  Per verbal order, Dr. Estanislado Pandy can sit up at 1800 and to closely monitor groin sites.

## 2019-12-06 NOTE — Progress Notes (Signed)
ANTICOAGULATION CONSULT NOTE - Initial Consult  Pharmacy Consult:  Heparin Indication:  S/p carotid stenting  Allergies  Allergen Reactions  . Chantix [Varenicline] Other (See Comments)    Weird dreams    Patient Measurements: Height: 5\' 5"  (165.1 cm) Weight: 44.8 kg (98 lb 12.3 oz) IBW/kg (Calculated) : 61.5 Heparin Dosing Weight: 44 kg  Vital Signs: Temp: 97.8 F (36.6 C) (11/04 1245) Temp Source: Oral (11/04 1200) BP: 107/76 (11/04 1300) Pulse Rate: 40 (11/04 1300)  Labs: Recent Labs    12/04/19 0453 12/05/19 0445 12/06/19 0019  HGB 8.9*  --  8.4*  HCT 27.8*  --  26.0*  PLT 558*  --  526*  CREATININE 0.65 0.78 0.66    Estimated Creatinine Clearance: 56 mL/min (by C-G formula based on SCr of 0.66 mg/dL).   Medical History: Past Medical History:  Diagnosis Date  . Anxiety   . Complication of anesthesia    " sometimes I wake up during surgery "  . Depression   . Left renal mass 07/19/2016  . Lung cancer ([place]) dx'd 2010    NSCL CA right  . Lung cancer ([place]) dx'd 2017   left  . Stroke [facility])     Assessment: 53 YOM with CVA in setting of carotid artery stenosis.  Now s/p stenting and patient to be anticoagulated with heparin until tomorrow AM.  Heparin started prior to transfer to 4N.  Baseline labs reviewed.  Goal of Therapy:  Heparin level 0.1-0.25 units/ml Monitor platelets by anticoagulation protocol: Yes   Plan:  D/C Lovenox Reduce heparin gtt to 450 units/hr Check 6 hr heparin level  Demarcus Thielke D. Mina Marble, PharmD, BCPS, [place] 12/06/2019, 1:36 PM

## 2019-12-06 NOTE — Progress Notes (Signed)
STROKE TEAM PROGRESS NOTE   INTERVAL HISTORY Pt seen at neuro ICU. No family at bedside.  Is lying in bed, awake alert.  Had right CCA stenting with Dr. Estanislado Pandy for today.  Procedure successful.  BP on the low side, IV fluid.  On aspirin Brilinta.  Vitals:   12/06/19 1345 12/06/19 1400 12/06/19 1430 12/06/19 1500  BP: 90/68 101/73 (!) 81/61 95/68  Pulse:  93 90 92  Resp: 19 16 17 14   Temp:      TempSrc:      SpO2:  94% 94% 99%  Weight:      Height:       CBC:  Recent Labs  Lab 12/04/19 0453 12/06/19 0019  WBC 15.5* 20.5*  HGB 8.9* 8.4*  HCT 27.8* 26.0*  MCV 86.9 87.5  PLT 558* 269*   Basic Metabolic Panel:  Recent Labs  Lab 12/05/19 0445 12/06/19 0019  NA 133* 130*  K 4.9 4.8  CL 94* 95*  CO2 30 25  GLUCOSE 133* 165*  BUN 14 16  CREATININE 0.78 0.66  CALCIUM 8.7* 8.3*  MG 2.1 2.0  PHOS 3.9 3.8    IMAGING  CT Code Stroke CTA Head W/WO contrast 11/21/2019   IMPRESSION:  1. Negative for large vessel occlusion.  2. 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 Bilateral CCA RADIOGRAPHIC STRING SIGNS, stable on the left but progressed on the right since a 2019 CTA. Recommend Vascular Surgery consultation.  3. 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 Left neck Venous Doppler Ultrasound should be confirmatory. 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  4. No carotid bifurcation plaque or stenosis. Bilateral ICA siphon plaque with only mild stenosis. Otherwise intracranial anterior and posterior circulation within normal limits.  5. 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 Continued abnormal  opacity in the superior segment of the left lower lobe from the Chest CTA 5 days ago.   CT HEAD WO CONTRAST 11/21/2019 IMPRESSION:  Small area of low-density in the left parietal lobe compatible with acute to subacute infarction.  CT Head Wo Contrast 10/28/2019 IMPRESSION:  No acute intracranial abnormalities. No specific evidence of an intracranial mass although contrast-enhanced MRI would be the most sensitive modality for detection of intracranial metastatic disease.   CT Code Stroke CTA Neck W/WO contrast 11/21/2019   IMPRESSION:  1. Negative for large vessel occlusion.  2. 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 Bilateral CCA RADIOGRAPHIC STRING SIGNS, stable on the left but progressed on the right since a 2019 CTA. Recommend Vascular Surgery consultation.  3. 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 Left neck Venous Doppler Ultrasound should be confirmatory. 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  4. No carotid bifurcation plaque or stenosis. Bilateral ICA siphon plaque with only mild stenosis. Otherwise intracranial anterior and posterior circulation within normal limits.  5. 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 Continued abnormal opacity in the superior segment of the left lower lobe from the Chest CTA 5 days ago.   MR ANGIO HEAD WO CONTRAST 11/22/2019 IMPRESSION:   MRI brain:  1. Markedly motion degraded examination with severe or moderate/severe motion degradation of all acquired post-contrast sequences. This precludes adequate evaluation for intracranial metastatic disease, and a repeat  examination is recommended when the patient is better able to tolerate study.  2. Predominantly gyriform enhancement within the left temporal occipital lobes. This may be related to the known acute/early subacute infarct at this site. However, attention is recommended on follow-up.  3. No other definite sites of abnormal intracranial enhancement are identified.   MRA head:  1. Moderate to severely motion degraded examination, precluding adequate evaluation for intracranial arterial stenoses and limiting evaluation for aneurysms.  2. No intracranial large vessel occlusion is identified.   MR BRAIN WO CONTRAST 11/21/2019 IMPRESSION:  1. Acute ischemic infarct involving the left temporoccipital region, corresponding with abnormality on prior CT, and consistent with an acute ischemic infarct. Additional scattered cortical and subcortical infarcts involving the frontal and parietal lobes bilaterally, watershed in distribution. Constellation of findings suggest a recent hypotensive episode/hypoperfusion injury. Associated minimal petechial hemorrhage at the left temporoccipital infarct. No other associated hemorrhage or mass effect.  2. Underlying tiny remote left cerebellar infarct. Otherwise negative brain MRI for age.  IR US Guide Vasc Access Right 11/26/2019 IMPRESSION:  Severe high-grade stenosis of the common carotid arteries bilaterally at the junction of the middle and proximal 1/3 is seen slightly worse on the left. 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  PLAN:  Findings reviewed with the patient and referring neurologist. Given the symptomatic nature of the right common carotid artery stenosis, endovascular revascularization scheduled for early next week.   ECHOCARDIOGRAM COMPLETE 11/22/2019 IMPRESSIONS   1. Left ventricular ejection fraction, by estimation, is 55 to 60%. The  left ventricle has normal function. The left ventricle has no regional wall motion abnormalities. Left ventricular diastolic parameters are consistent with Grade I diastolic dysfunction (impaired relaxation). Elevated left atrial pressure.   2. Right ventricular systolic function is normal. The right ventricular size is normal.   3. The mitral valve is normal in structure. Trivial mitral valve regurgitation. No evidence of mitral stenosis.   4. The aortic valve is normal in structure. Aortic valve regurgitation is not visualized. No aortic stenosis is present.   5. The inferior vena cava is normal in size with greater than 50% respiratory variability, suggesting right atrial pressure of 3 mmHg. Comparison(s): No prior Echocardiogram.   VAS [place] UPPER EXTREMITY VENOUS DUPLEX 11/22/2019 Summary:   Right: No evidence of thrombosis in the subclavian.   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   PHYSICAL EXAM       Temp:  [97.8 F (36.6 C)-98.8 F (37.1 C)] 98 F (36.7 C) (11/04 1300) Pulse Rate:  [40-109] 92 (11/04 1500) Resp:  [14-25] 14 (11/04 1500) BP: (79-131)/(55-96) 95/68 (11/04 1500) SpO2:  [92 %-100 %] 99 % (11/04 1500) Arterial Line BP: (93-114)/(50-61) 111/61 (11/04 1500) Weight:  [44.8 kg] 44.8 kg (11/04 0330)  General -frail cachectic malnourished looking elderly male in no apparent distress. He has right below-knee amputation.  Ophthalmologic - fundi not visualized due to noncooperation.  Cardiovascular - Regular rhythm and rate.  Respiratory - b/l Rhonchi, secretion in throat with weak cough.  Neuro stable - awake alert, oriented x3.  No aphasia, hypophonic speech with mild dysarthria weak cough, able to name and repeat.  Follows commands.  No gaze preference.  Mild left facial droop, tongue midline.  Left upper extremity 3+/5 deltoid, 3/5  bicep, 3/5 tricep and finger grip. Significant weakness of left hand intrinsic hand muscles. Right upper extremity at least 4/5.  Bilateral lower extremity at least 4/5 with right BKA.  Sensation symmetrical.  Finger-to-nose intact on the right.  Gait not tested.   ASSESSMENT/PLAN Evan [name] is a 69 y.o. male with history of recurrent non small cell lung cancer (stage IIIa status post chemo and radiation), traumatic right knee BKA, recent recurrent pneumonia, left renal mass, known prior bilateral carotid artery stenosis, COPD w/ recent exacerbation d/c 10/19. He has had generalized weakness and poor sleep, chronic swallowing, wt loss with poor apptetite and his son and dtr recently moved in to help him. Plans for prostate bx for which he had stopped his aspirin. On 10/20 presented to [facility] ED with difficulty w/ spatial awareness and vision guided movements, HA, L sided weakness. Transfered to [employer]. [facility].  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 Additional scattered cortical + subcortical B watershed infarcts. Min petechial hemorrhage in L temporooccipital infarct. Old tiny L cerebellar infarct  CTA head & neck no LVO. 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 suspicion for L IJ thrombosis. B ICA siphon w/ mild stenosis. LLL opacity.   MRA head  Unremarkable   Cerebral angio confirmed bilateral CCA high-grade stenosis  2D Echo EF 55 to 60%  LDL 154   HgbA1c 6.3  VTE prophylaxis - Lovenox 40 mg sq daily   off aspirin for planned prostate bx prior to admission, now on aspirin 81 mg daily, brilinta 30 bid and Heparin IV post op.  Therapy recommendations:  SNF  Disposition:  pending   Recurrent PNA and Respiratory Distress / Agitation with concern for  aspiration COPD  Increased WOB 10/21 evening  Put on BIPAP w/ relief  Changed level to progressive care   Treated w/ ativan and haldol. Good relief w/ haldol  Constant rhonchi but not in distress  No dysarthria  Close monitoring  Carotid Stenosis   CTA neck 02/2017 Long segment of severe 80% right and severe 90% left proximal  common carotid artery stenosis.  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  Cerebral angio confirmed bilateral CCA high-grade stenosis  S/p R CCA stenting 12/06/19   Now on ASA and brilinta 30 bid and Heparin IV  L parietooccipital lesion  MRI w/ severely degraded, enhancement L temporooccipital lobe, repeat recommended  Likely due to infarction  MRI with contrast nondiagnostic - motion due to SOB lying flat  L IJ thrombosis  No prior line placement in neck per family, history of for Port-A-Cath on right chest per patient  CTA neck in 2019 also suspicious for left IJ thrombosis, however not for certain - discussed with Dr. 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  History of hypertension Hypotension, resolved  Recurrent low BP   Bolus IVF followed by albumin w/ IVF at 100 1//22  SBP goal 120's to 140's per Dr Estanislado Pandy - A line . No apparent CHF history . Long-term BP goal 130-150 given carotid stenosis.   Hyperlipidemia  Home meds:  No statin  Now on lipitor 80  LDL 154, goal < 70  Continue statin at discharge  Hematuria with urinary retention Enlarged Prostate  Foley  On Brilinta 30 bid w/ aspirin 81  HGB  11.3->10.1->8.7  OP follow up planned  Stage IIIa Non Small cell lung CA  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   recurrence on the left side presented as a stage IIIa non-small cell lung cancer, squamous cell carcinoma status post concurrent chemoradiation followed by consolidation chemotherapy. Completed in March 2017.  He also underwent SBRT to the right middle lobe lung nodule under the care of Dr. [name] completed on 07/24/2018.  He also underwent SBRT to left lower lobe lung nodule under the care of Dr. [name] completed on April 27, 2019.  Recent scan showed no concerning findings for disease progression  Followed with Dr. Julien Nordmann  Other Stroke Risk Factors  Advanced age  Cigarette smoker, quit 7 mos ago, advised to stop smoking  Other Active Problems  Recent COPD exacerbation, d/c 10/19. Nebs, pulmonary toilet for respiratory distress. On mucomyst  Recent recurrent PNA.   Marked enlargement of the Prostate w/ plans for bx    FTT. Cachexia, Body mass index is 16.44 kg/m.   Traumatic right BKA  Tachycardia   Leukocytosis 10.6 . . . . . . 20.5 (afebrile) on prednisolone  TSH high, free T4 normal   Anemia - Hgb - 8.4  Hyponatremia - Na - 130  Palliative care following pt.  Code status - Full code  Rosalin Hawking, MD PhD Stroke Neurology 12/06/2019 9:05 PM    To contact Stroke Continuity provider, please refer to [email or web address] After hours, contact General Neurology

## 2019-12-06 NOTE — H&P (Signed)
Brief H&P  Patient presents for image-guided cerebral arteriogram with possible intervention for right CCA stenosis with symptoms. Patient is assessed at bedside this AM alongside Dr. Estanislado Pandy.  There have been no changes to his medical status overnight.  He remains weak on the left side. Lungs with coarse breath sounds bilaterally, bilateral wheezes.  He did get his breathing treatments this AM. NGT in place. Brilinta scheduled for this AM. No P2Y12 is required.   Dr. Estanislado Pandy has discussed case as well as risks and benefits with patient and his son.  Case also discussed with anesthesia as patient is high risk due to his history of COPD.   Labs and medications reviewed.   Brynda Greathouse, MS RD PA-C

## 2019-12-06 NOTE — Progress Notes (Signed)
PROGRESS NOTE  Evan [name]  DOB: 02-24-1950  PCP: Patient, No Pcp Per XVQ:[id number]  DOA: 11/21/2019  LOS: 15 days   Chief complaint: Confusion, weakness  Brief narrative: Evan [name] is a 68 y.o. 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 Patient presented to the ED on 10/20 with significant confusion, weakness for 24 hours.  He was found to have CVA with carotid stenosis and currently waiting for stenting. Stent placement was on hold due to significant abnormal breath sounds. His respiratory status subsequently improved.   11/3, he was scheduled for stent placement but it got postponed because of another emergency case.   Due to poor nutrition and oral intake, feeding tube placed 11/30/2019.   Subjective: Patient was seen and examined this afternoon. Underwent right carotid artery stenosis stenting this afternoon. Somnolent.  Open eyes on verbal command.  Not in distress. Core track in place.  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   -Imaging showed acute ischemic infarct involving the left temporoccipital region, -LDL 154, A1c 6.3.  Echocardiogram EF 55 to 60%.  CTA head and neck did not show LVO but showed severe bilateral carotid artery stenosis therefore IR consulted for stent placement. -Currently on Brilinta, aspirin and Lipitor 40 mg daily. -Underwent right carotid artery stenting today.   Acute respiratory failure with hypoxia History of COPD Stage III lung cancer -During this hospitalization, patient had bilateral diffuse coarse breath sounds from aspiration pneumonia and upper airway secretion.   Because of worsening respiratory status, carotid stenting was postponed.  -Chest x-ray did not show any infiltrate.  Respiratory status much better today. -Continue bronchodilators, scopolamine patch. -Incentive spirometer and flutter valve, Mucinex -Solu-Medrol every 12 hours, slow taper.  Switch to prednisone daily. -On Mucomyst -Refusing chest vest -Completed 7-day course of Unasyn  Atypical left-sided chest pain, resolved -Resolved. Trop was slightly elevated secondary to underlying pulmonary pathology  Leukocytosis -Secondary to steroid.  Continue to monitor.  No fever. Recent Labs  Lab 12/01/19 2315 12/04/19 0453 12/06/19 0019  WBC 19.7* 15.5* 20.5*   Essential hypertension -Blood pressure currently controlled without meds.  Hematuria, resolved -Suspect from traumatic Foley placement with history of BPH.  Seen by urology, recommended intermittent flushing if necessary.  Foley catheter changed on 11/28/2019  Severe protein calorie malnutrition -Temporary feeding tube placed 11/30/2019 for nutritional augmentation.  No evidence of dysphagia..  -Encourage oral appetite.  On regular diet.  Plan to pull out core track tomorrow.  Mobility: PT evaluation appreciated Code Status:   Code Status: Full Code  Nutritional status: Body mass index is 16.44 kg/m. Nutrition Problem: Severe Malnutrition Etiology: chronic illness, cancer and cancer related treatments Signs/Symptoms: severe muscle depletion, severe fat depletion Percent weight loss: 19.8 % (x7 months) Diet Order            Diet NPO time specified Except for: Sips with Meds  Diet effective midnight                 DVT prophylaxis:    Antimicrobials:  Completed the course Fluid: None Consultants: Neuroradiology Family Communication:  Daughter at bedside  Status is: Inpatient  Remains inpatient appropriate because, pending stent placement today   Dispo: The patient is from:  Home               Anticipated d/c is to: SNF recommended by PT              Anticipated d/c date is: 1 to 2 days              Patient currently is not medically stable to d/c.   Infusions:  . sodium chloride 10 mL/hr at 12/06/19 0836  . sodium chloride 75 mL/hr at 12/06/19 1500  . clevidipine    . feeding supplement (OSMOLITE 1.2 CAL) 60 mL/hr at 12/06/19 0600  . heparin    . heparin 450 Units/hr (12/06/19 1500)    Scheduled Meds: .  stroke: mapping our early stages of recovery book   Does not apply Once  . arformoterol  15 mcg Nebulization BID  . [START ON 12/07/2019] aspirin  81 mg Oral Daily   Or  . [START ON 12/07/2019] aspirin  81 mg Per Tube Daily  . atorvastatin  80 mg Oral Daily  . budesonide (PULMICORT) nebulizer solution  0.25 mg Nebulization BID  . chlorhexidine  15 mL Mouth/Throat Once  . chlorhexidine      . Chlorhexidine Gluconate Cloth  6 each Topical Daily  . feeding supplement  237 mL Oral TID BM  . feeding supplement (PROSource TF)  45 mL Per Tube Daily  . guaiFENesin  600 mg Oral BID  . insulin aspart  0-6 Units Subcutaneous Q4H  . lidocaine      . methylPREDNISolone (SOLU-MEDROL) injection  40 mg Intravenous Q12H  . multivitamin with minerals  1 tablet Oral Daily  . nicotine  14 mg Transdermal Daily  . nitroGLYCERIN      . pantoprazole  40 mg Oral BID  . revefenacin  175 mcg Nebulization Daily  . scopolamine  1 patch Transdermal Q72H  . tamsulosin  0.4 mg Oral QHS  . ticagrelor  30 mg Oral BID   Or  . ticagrelor  30 mg Per Tube BID    Antimicrobials: Anti-infectives (From admission, onward)   Start     Dose/Rate Route Frequency Ordered Stop   11/28/19 0000  ceFAZolin (ANCEF) IVPB 2g/100 mL premix  Status:  Discontinued        2 g 200 mL/hr over 30 Minutes Intravenous 60 min pre-op 11/27/19 1730 11/28/19 0857   11/28/19 0000  ceFAZolin (ANCEF) IVPB 2g/100 mL premix  Status:  Discontinued        2 g 200 mL/hr over 30 Minutes Intravenous 60 min pre-op 11/27/19 1730  11/27/19 1733   11/25/19 1530  Ampicillin-Sulbactam (UNASYN) 3 g in sodium chloride 0.9 % 100 mL IVPB        3 g 200 mL/hr over 30 Minutes Intravenous Every 8 hours 11/25/19 1432 12/02/19 2359      PRN meds: acetaminophen **OR** acetaminophen (TYLENOL) oral liquid 160 mg/5 mL **OR** acetaminophen, albuterol, hydrOXYzine, LORazepam, senna-docusate   Objective: Vitals:   12/06/19 1430 12/06/19 1500  BP: (!) 81/61 95/68  Pulse: 90 92  Resp: 17 14  Temp:    SpO2: 94% 99%    Intake/Output Summary (Last 24 hours) at 12/06/2019 1511 Last data filed at 12/06/2019 1500 Gross per 24 hour  Intake 891.35 ml  Output 1810 ml  Net -918.65 ml   Filed Weights   12/03/19 0500 12/05/19 0500 12/06/19 0330  Weight: 48 kg 45.8 kg 44.8 kg   Weight change: -1 kg Body mass index is 16.44 kg/m.  Physical Exam: General exam: Appears calm and comfortable.  Not in physical distress Skin: No rashes, lesions or ulcers. HEENT: Atraumatic, normocephalic, supple neck, no obvious bleeding.  Core track in place Lungs: Clear to auscultation bilaterally CVS: Regular rate and rhythm, no murmur GI/Abd soft, nontender, nondistended, bowel sound present CNS: Somnolent from the effect of anesthesia.  Opens eyes on verbal command.  Oriented. Psychiatry: Mood appropriate Extremities: No pedal edema, no calf tenderness  Data Review: I have personally reviewed the laboratory data and studies available.  Recent Labs  Lab 12/01/19 2315 12/04/19 0453 12/06/19 0019  WBC 19.7* 15.5* 20.5*  HGB 8.2* 8.9* 8.4*  HCT 25.9* 27.8* 26.0*  MCV 85.5 86.9 87.5  PLT 492* 558* 526*   Recent Labs  Lab 12/01/19 2315 12/03/19 0223 12/04/19 0453 12/05/19 0445 12/06/19 0019  NA 134* 132* 133* 133* 130*  K 4.7 4.7 4.2 4.9 4.8  CL 97* 96* 96* 94* 95*  CO2 29 26 26 30 25   GLUCOSE 207* 92 114* 133* 165*  BUN 16 14 17 14 16   CREATININE 0.66 0.52* 0.65 0.78 0.66  CALCIUM 8.2* 8.1* 8.5* 8.7* 8.3*  MG 2.0 2.2 2.1 2.1  2.0  PHOS 2.5 3.3 3.5 3.9 3.8    F/u labs ordered.  Signed, Terrilee Croak, MD [facility] 12/06/2019

## 2019-12-06 NOTE — Sedation Documentation (Signed)
Stent placed in the right common carotid

## 2019-12-06 NOTE — Anesthesia Procedure Notes (Signed)
Arterial Line Insertion Start/End11/05/2019 8:25 AM, 12/06/2019 8:40 AM Performed by: Gaylene Brooks, CRNA, CRNA  Preanesthetic checklist: patient identified, IV checked, site marked, risks and benefits discussed, surgical consent, monitors and equipment checked, pre-op evaluation, timeout performed and anesthesia consent Lidocaine 1% used for infiltration Left, radial was placed Catheter size: 20 G Hand hygiene performed  and maximum sterile barriers used   Attempts: 1 Procedure performed without using ultrasound guided technique. Following insertion, Biopatch and dressing applied. Post procedure assessment: normal  Patient tolerated the procedure well with no immediate complications.

## 2019-12-06 NOTE — Sedation Documentation (Signed)
Under the care of anesthesia.

## 2019-12-06 NOTE — Anesthesia Preprocedure Evaluation (Signed)
Anesthesia Evaluation  Patient identified by MRN, date of birth, ID band Patient awake    Reviewed: Allergy & Precautions, NPO status , Patient's Chart, lab work & pertinent test results  Airway Mallampati: III  TM Distance: >3 FB Neck ROM: Full    Dental  (+) Teeth Intact, Dental Advisory Given   Pulmonary COPD, former smoker,  Recent recovery from PNA. Continues on 3L [place] with mild increased work of breathing.  Per patient and internist notes, this is significantly improved from before.  H/o lung CA    + decreased breath sounds      Cardiovascular hypertension,  Rhythm:Regular Rate:Normal     Neuro/Psych  Headaches, PSYCHIATRIC DISORDERS Anxiety Depression CVA    GI/Hepatic negative GI ROS, Neg liver ROS,   Endo/Other  negative endocrine ROS  Renal/GU negative Renal ROS     Musculoskeletal negative musculoskeletal ROS (+)   Abdominal Normal abdominal exam  (+)   Peds  Hematology negative hematology ROS (+)   Anesthesia Other Findings   Reproductive/Obstetrics                             Anesthesia Physical  Anesthesia Plan  ASA: III  Anesthesia Plan: MAC   Post-op Pain Management:    Induction: Intravenous  PONV Risk Score and Plan: 0 and Propofol infusion  Airway Management Planned: Natural Airway and Simple Face Mask  Additional Equipment: Arterial line  Intra-op Plan:   Post-operative Plan:   Informed Consent: I have reviewed the patients History and Physical, chart, labs and discussed the procedure including the risks, benefits and alternatives for the proposed anesthesia with the patient or authorized representative who has indicated his/her understanding and acceptance.       Plan Discussed with: CRNA  Anesthesia Plan Comments: (Postponed due to emergent IR case  Lab Results      Component                Value               Date                      WBC                       15.5 (H)            12/04/2019                HGB                      8.9 (L)             12/04/2019                HCT                      27.8 (L)            12/04/2019                MCV                      86.9                12/04/2019                PLT  558 (H)             12/04/2019            Lab Results      Component                Value               Date                      CREATININE               0.78                12/05/2019                BUN                      14                  12/05/2019                NA                       133 (L)             12/05/2019                K                        4.9                 12/05/2019                CL                       94 (L)              12/05/2019                CO2                      30                  12/05/2019            Echo: 1. Left ventricular ejection fraction, by estimation, is 55 to 60%. The  left ventricle has normal function. The left ventricle has no regional  wall motion abnormalities. Left ventricular diastolic parameters are  consistent with Grade I diastolic  dysfunction (impaired relaxation). Elevated left atrial pressure.  2. Right ventricular systolic function is normal. The right ventricular  size is normal.  3. The mitral valve is normal in structure. Trivial mitral valve  regurgitation. No evidence of mitral stenosis.  4. The aortic valve is normal in structure. Aortic valve regurgitation is  not visualized. No aortic stenosis is present.  5. The inferior vena cava is normal in size with greater than 50%  respiratory variability, suggesting right atrial pressure of 3 mmHg. )        Anesthesia Quick Evaluation

## 2019-12-06 NOTE — Progress Notes (Signed)
PT Cancellation Note  Patient Details Name: MERRIT FRIESEN MRN: [id number] DOB: [birth year]   Cancelled Treatment:    Reason Eval/Treat Not Completed: Active bedrest order. Per orders pt to be on bedrest with leg kept straight until tomorrow morning at 7AM. PT will follow up tomorrow.   Zenaida Niece 12/06/2019, 2:07 PM

## 2019-12-06 NOTE — Progress Notes (Signed)
ANTICOAGULATION CONSULT NOTE - Initial Consult  Pharmacy Consult:  Heparin Indication:  S/p carotid stenting  Allergies  Allergen Reactions  . Chantix [Varenicline] Other (See Comments)    Weird dreams    Patient Measurements: Height: 5\' 5"  (165.1 cm) Weight: 44.8 kg (98 lb 12.3 oz) IBW/kg (Calculated) : 61.5 Heparin Dosing Weight: 44 kg  Vital Signs: Temp: 97.8 F (36.6 C) (11/04 2000) Temp Source: Oral (11/04 2000) BP: 86/70 (11/04 1900) Pulse Rate: 94 (11/04 1900)  Labs: Recent Labs    12/04/19 0453 12/05/19 0445 12/06/19 0019 12/06/19 2029  HGB 8.9*  --  8.4*  --   HCT 27.8*  --  26.0*  --   PLT 558*  --  526*  --   HEPARINUNFRC  --   --   --  <0.10*  CREATININE 0.65 0.78 0.66  --     Estimated Creatinine Clearance: 56 mL/min (by C-G formula based on SCr of 0.66 mg/dL).  Assessment: 43 YOM with CVA in setting of carotid artery stenosis.  Now s/p stenting and patient to be anticoagulated with heparin until tomorrow AM.  Heparin started prior to transfer to 4N.  Baseline labs reviewed.  Hep lvl < 0.1  Goal of Therapy:  Heparin level 0.1-0.25 units/ml Monitor platelets by anticoagulation protocol: Yes   Plan:  Increase heparin to 550 units/hr Off at 0800 per protocol  Barth Kirks, PharmD, BCPS, BCCCP Clinical Pharmacist [phone]  Please check AMION for all [place] numbers  12/06/2019 10:50 PM

## 2019-12-06 NOTE — Progress Notes (Signed)
SLP Cancellation Note  Patient Details Name: TILAK OAKLEY MRN: [id number] DOB: 12/19/1950   Cancelled treatment:       Reason Eval/Treat Not Completed: Patient at procedure or test/unavailable   Hassie Mandt, Katherene Ponto 12/06/2019, 8:02 AM

## 2019-12-06 NOTE — Progress Notes (Signed)
Interventional Radiology Brief Note:  Patient assessed at bedside this afternoon.  Increased strength in his left arm this afternoon.  LLE remains 5/5.  Denies headache, blurry vision, or trouble speaking.  Does sound more rhonchus.  Falls asleep quickly, appears tired.  BP 104/53 by arterial line upon arrival, but increases with stimulation. Hopeful for gradual decrease in IVF to prevent respiratory decompensation. Will reach out to primary team for assistance.   Groin soft, intact.  Dressing replaced.  Area compressible.  No evidence of hematoma or pseudoaneurysm.  Ok per Dr. Estanislado Pandy to elevate [facility] after 6pm with careful watch for any signs of bleeding/oozing, swelling in groin.   Brynda Greathouse, MS RD PA-C 5:12 PM

## 2019-12-06 NOTE — Progress Notes (Addendum)
Informed by vascular surgery team about congested breath sound. At the time of my evaluation, patient is on 2 L of oxygen. He seems to have difficulty coughing up secretions and seems to be holding secretions on the throat.  I asked him to cough a few times.  Lung sounds better but on the right lung base, he still has rhonchi. Suspect mild aspiration. Chest x-ray this afternoon showed slight increase in interstitial markings raising suspicion of pulmonary edema.  Also has a nonspecific ill-defined nodular opacity in the right midlung. His blood pressure is running low in the 80s and he is currently on normal saline at 75 mill per hour.  At this time, patient is maintaining oxygen saturation high percent on 2 L.  Not in distress. Continue scopolamine patch, bronchodilators. If respiratory status worsens, may need to DC IV fluid and start on pressors to maintain blood pressure. Communicated to RN and vascular surgery team.

## 2019-12-06 NOTE — Sedation Documentation (Signed)
ACT 197

## 2019-12-06 NOTE — Progress Notes (Signed)
OT Cancellation Note  Patient Details Name: Evan [name] MRN: [id number] DOB: December 09, 1950   Cancelled Treatment:    Reason Eval/Treat Not Completed: Patient at procedure or test/ unavailable (surgery)  Latangela Mccomas,HILLARY 12/06/2019, 7:59 AM  Maurie Boettcher, OT/L   Acute OT Clinical Specialist [place] Pager [phone] Office [phone]

## 2019-12-06 NOTE — Sedation Documentation (Signed)
ACT 219

## 2019-12-06 NOTE — Procedures (Signed)
S/P RT common carotid arteriogram followed stent assisted angioplasty of symptomatic RT CCA stenosis.  Lt CFA approach. Post procedure Lt groin soft after manual compression. Distal pulses  Dopplerable Lt DP and PT unchanged. Alert .Awake . Responses appropriate.  Clinically patient stable.. Denies any H/As,N/V visual or speech changes. Pupils 2 to 3 mm RT = LT  Mild Lt facial droop.  Able to move Lt UE more briskly. Lt hand grip 4/5 unchanged.[name] S.Brittan Mapel MD

## 2019-12-06 NOTE — Sedation Documentation (Addendum)
Heather IR tech continuing to hold direct pressure to drainage from left groin site.

## 2019-12-07 ENCOUNTER — Inpatient Hospital Stay (HOSPITAL_COMMUNITY): Payer: Medicare PPO

## 2019-12-07 ENCOUNTER — Encounter (HOSPITAL_COMMUNITY): Payer: Self-pay | Admitting: Interventional Radiology

## 2019-12-07 DIAGNOSIS — R609 Edema, unspecified: Secondary | ICD-10-CM

## 2019-12-07 LAB — CBC WITH DIFFERENTIAL/PLATELET
Abs Immature Granulocytes: 0.05 10*3/uL (ref 0.00–0.07)
Basophils Absolute: 0 10*3/uL (ref 0.0–0.1)
Basophils Relative: 0 %
Eosinophils Absolute: 0 10*3/uL (ref 0.0–0.5)
Eosinophils Relative: 0 %
HCT: 21 % — ABNORMAL LOW (ref 39.0–52.0)
Hemoglobin: 6.6 g/dL — CL (ref 13.0–17.0)
Immature Granulocytes: 0 %
Lymphocytes Relative: 5 %
Lymphs Abs: 0.6 10*3/uL — ABNORMAL LOW (ref 0.7–4.0)
MCH: 28 pg (ref 26.0–34.0)
MCHC: 31.4 g/dL (ref 30.0–36.0)
MCV: 89 fL (ref 80.0–100.0)
Monocytes Absolute: 0.9 10*3/uL (ref 0.1–1.0)
Monocytes Relative: 7 %
Neutro Abs: 10.7 10*3/uL — ABNORMAL HIGH (ref 1.7–7.7)
Neutrophils Relative %: 88 %
Platelets: 511 10*3/uL — ABNORMAL HIGH (ref 150–400)
RBC: 2.36 MIL/uL — ABNORMAL LOW (ref 4.22–5.81)
RDW: 19.7 % — ABNORMAL HIGH (ref 11.5–15.5)
WBC: 12.3 10*3/uL — ABNORMAL HIGH (ref 4.0–10.5)
nRBC: 0 % (ref 0.0–0.2)

## 2019-12-07 LAB — GLUCOSE, CAPILLARY
Glucose-Capillary: 110 mg/dL — ABNORMAL HIGH (ref 70–99)
Glucose-Capillary: 108 mg/dL — ABNORMAL HIGH (ref 70–99)
Glucose-Capillary: 128 mg/dL — ABNORMAL HIGH (ref 70–99)
Glucose-Capillary: 144 mg/dL — ABNORMAL HIGH (ref 70–99)
Glucose-Capillary: 127 mg/dL — ABNORMAL HIGH (ref 70–99)

## 2019-12-07 LAB — PREPARE RBC (CROSSMATCH)

## 2019-12-07 LAB — ABO/RH: ABO/RH(D): O POS

## 2019-12-07 LAB — BASIC METABOLIC PANEL
Anion gap: 7 (ref 5–15)
BUN: 13 mg/dL (ref 8–23)
CO2: 23 mmol/L (ref 22–32)
Calcium: 7 mg/dL — ABNORMAL LOW (ref 8.9–10.3)
Chloride: 105 mmol/L (ref 98–111)
Creatinine, Ser: 0.48 mg/dL — ABNORMAL LOW (ref 0.61–1.24)
GFR, Estimated: >60 mL/min (ref >60)
Glucose, Bld: 107 mg/dL — ABNORMAL HIGH (ref 70–99)
Potassium: 3.9 mmol/L (ref 3.5–5.1)
Sodium: 135 mmol/L (ref 135–145)

## 2019-12-07 MED ORDER — HEPARIN (PORCINE) 25000 UT/250ML-% IV SOLN
850.0000 [IU]/h | INTRAVENOUS | Status: DC
  Administered 2019-12-07: 700 [IU]/h via INTRAVENOUS
  Filled 2019-12-07: qty 250

## 2019-12-07 MED ORDER — SODIUM CHLORIDE 0.9% IV SOLUTION: Freq: Once | INTRAVENOUS | Status: AC

## 2019-12-07 MED ORDER — SODIUM CHLORIDE 0.9 % IV SOLN
3.0000 g | Freq: Three times a day (TID) | INTRAVENOUS | Status: DC
  Administered 2019-12-07 – 2019-12-10 (×9): 3 g via INTRAVENOUS
  Filled 2019-12-07 (×3): qty 3
  Filled 2019-12-07 (×5): qty 8
  Filled 2019-12-07: qty 3
  Filled 2019-12-07 (×3): qty 8
  Filled 2019-12-07 (×2): qty 3

## 2019-12-07 NOTE — Progress Notes (Signed)
CRITICAL VALUE ALERT  Critical Value:  hgb 6.6  Date & Time Notied:12/07/19 0244  Provider Notified: [place]  Orders Received/Actions taken: order to type and screen and transfuse received.

## 2019-12-07 NOTE — Progress Notes (Signed)
Pharmacy Antibiotic Note  Evan [name] is a 69 y.o. male admitted on 11/21/2019 with left-sided weakness and headache in setting of carotid artery stenosis, now s/p stenting.  Patient completed a course of Unasyn for aspiration PNA; however, CT today again suggests aspiration PNA.   Pharmacy has been consulted to restart Unasyn.    Renal function stable, afebrile, WBC down 12.3.  Plan: Unasyn 3gm IV Q8H Pharmacy will sign off and follow peripherally.  Thank you for the consult!  Height: 5\' 5"  (165.1 cm) Weight: 46.4 kg (102 lb 4.7 oz) IBW/kg (Calculated) : 61.5  Temp (24hrs), Avg:97.9 F (36.6 C), Min:97.6 F (36.4 C), Max:98 F (36.7 C)  Recent Labs  Lab 12/01/19 2315 12/01/19 2315 12/03/19 0223 12/04/19 0453 12/05/19 0445 12/06/19 0019 12/07/19 0038  WBC 19.7*  --   --  15.5*  --  20.5* 12.3*  CREATININE 0.66   < > 0.52* 0.65 0.78 0.66 0.48*   < > = values in this interval not displayed.    Estimated Creatinine Clearance: 58 mL/min (A) (by C-G formula based on SCr of 0.48 mg/dL (L)).    Allergies  Allergen Reactions  . Chantix [Varenicline] Other (See Comments)    Weird dreams    Unasyn 10/24 >> 10/31, restarted 11/5 >>   10/20 Fluvid >> neg 10/27 MRSA PCR >> neg  Evan [name], PharmD, BCPS, [place] 12/07/2019, 2:02 PM

## 2019-12-07 NOTE — Progress Notes (Signed)
Left upper ext venous  has been completed. Refer to [facility] under chart review to view preliminary results. Results given to patient's nurse, Vallarie Mare.  12/07/2019  1:53 PM Lord Lancour, Bonnye Fava

## 2019-12-07 NOTE — Progress Notes (Signed)
Nutrition Follow-up  DOCUMENTATION CODES:   Severe malnutrition in context of chronic illness  INTERVENTION:  Recommend keeping NGT in place over next 24-48 hours to monitor adequacy of pt's PO intake. If pt meeting >75% estimated needs, may d/c NGT. If pt is not meeting his needs po, recommend resuming TF as ordered and readdressing GOC to determine if pt is candidate for PEG.   Continue with orders for Ensure Enlive po TID, each supplement provides 350 kcal and 20 grams of protein   NUTRITION DIAGNOSIS:   Severe Malnutrition related to chronic illness, cancer and cancer related treatments as evidenced by severe muscle depletion, severe fat depletion.  Ongoing  GOAL:   Patient will meet greater than or equal to 90% of their needs  progressing  MONITOR:   PO intake, Supplement acceptance, Labs, Weight trends, I & O's, Diet advancement  REASON FOR ASSESSMENT:   Malnutrition Screening Tool    ASSESSMENT:   Pt admitted with CVA. PMH includes lung Ca stage III s/p chemo now in remission, recent hospitalization with recuurent PNA, COPD, depression, L renal mass, anxiety disorder, traumatic R BKA.  10/22 cerebral angiogram 10/29 Cortrak placed (postpyloric) 11/04 cerebral arteriogram with revascularization of R CCA stenosis using stent assisted angioplasty via L femoral approach (attempted R but was unsuccessful)  Per PA, pt's neurological function is stable - A&O x3 and pt can spontaneously move all extremities. Pt with bilateral lower lobe aspiration PNA and L upper extremity DVT (likely related to his cancer per MD).  Pt has been receiving TF via NGT as his po intake has been insufficient to meet his needs. Pt's current TF orders are as follows: Osmolite 1.2 cal @ 26ml/hr with 25ml Prosource TF daily. Today, pt's diet was advanced to Dysphagia 3. RD discussed pt with RN and was informed that plan was to remove NGT due to it causing pt discomfort. Informed RN that pt previously  was not eating enough to meet needs resulting in the NGT placement. Noted that NGT is currently clamped. Would not recommend pulling tube until pt can consistently meet >75% of his nutritional needs through po intake.  Pt already with orders for Ensure Enlive TID. Will continue and monitor for adequacy of po intake.   UOP: 1578ml x24 hours  Admit wt: 48.1 kg Current wt: 46.4 kg  Labs: CBGs [phone] Medications: ss novolog, mvi, protonix, prednisolone IVF: NS @ 7ml/hr  Diet Order:   Diet Order            DIET DYS 3 Room service appropriate? Yes; Fluid consistency: Thin  Diet effective now                 EDUCATION NEEDS:   Education needs have been addressed  Skin:  Skin Assessment: Reviewed RN Assessment  Last BM:  11/2  Height:   Ht Readings from Last 1 Encounters:  11/21/19 5\' 5"  (1.651 m)    Weight:   Wt Readings from Last 1 Encounters:  12/07/19 46.4 kg    BMI:  Body mass index is 17.02 kg/m.  Estimated Nutritional Needs:   Kcal:  1700-1900  Protein:  85-100 grams  Fluid:  >/=1.7L/d    Larkin Ina, MS, RD, LDN RD pager number and weekend/on-call pager number located in [place].

## 2019-12-07 NOTE — Progress Notes (Addendum)
Referring Physician(s): Rosalin Hawking (neurology)  Supervising Physician: Luanne Bras  Patient Status:  [facility] - In-pt  Chief Complaint: "Speech"  Subjective:  History of acute CVA (left temporooccipital infarct and R>L watershed infarcts) in setting of bilateral common carotid artery stenosis- thought to be cause of recent CVA (left-sided weakness- symptomatic from right CCA stenosis) s/p cerebral arteriogram with revascularization of right CCA stenosis using stent assisted angioplasty via left (and attempted but unsuccessful right) femoral approach 12/06/2019 by Dr. Estanislado Pandy. Patient awake and alert sitting in bed working with speech therapy. Can spontaneously move all extremities. Left and right femoral puncture sites c/d/i. Still with wheezing/rhonchi on exam. No hematuria noted. Hgb down 6.6 today (from 8.4 yesterday)- currently receiving blood transfusion.   Allergies: Chantix [varenicline]  Medications: Prior to Admission medications   Medication Sig Start Date End Date Taking? Authorizing Provider  acetaminophen (TYLENOL) 500 MG tablet Take 500 mg by mouth every 6 (six) hours as needed for mild pain, fever or headache.    Yes [provider]  albuterol (VENTOLIN HFA) 108 (90 Base) MCG/ACT inhaler TAKE 2 PUFFS BY MOUTH EVERY 6 HOURS AS NEEDED FOR WHEEZE OR SHORTNESS OF BREATH Patient taking differently: Inhale 2 puffs into the lungs every 6 (six) hours as needed for wheezing or shortness of breath.  08/30/19  Yes Rigoberto Noel, MD  benzonatate (TESSALON) 100 MG capsule Take 1 capsule (100 mg total) by mouth every 8 (eight) hours. 11/09/19  Yes Dorie Rank, MD  Glycopyrrolate-Formoterol (BEVESPI AEROSPHERE) 9-4.8 MCG/ACT AERO Inhale 2 puffs into the lungs 2 (two) times daily. 06/26/19  Yes Rigoberto Noel, MD  guaiFENesin (MUCINEX) 600 MG 12 hr tablet Take 600 mg by mouth 2 (two) times daily as needed for cough.   Yes [provider]  guaiFENesin  (ROBITUSSIN) 100 MG/5ML liquid Take 200 mg by mouth 3 (three) times daily as needed for cough.   Yes [provider]  nicotine (NICODERM CQ - DOSED IN MG/24 HOURS) 14 mg/24hr patch Place 1 patch (14 mg total) onto the skin daily. For 6 weeks.  Then decrease to 7 mg daily. 07/25/19  Yes Yopp, Amber C, RPH-CPP  tamsulosin (FLOMAX) 0.4 MG CAPS capsule Take 0.4 mg by mouth at bedtime. 10/10/19  Yes [provider]  budesonide (PULMICORT) 0.5 MG/2ML nebulizer solution TAKE 2 MLS (0.5 MG TOTAL) BY NEBULIZATION 2 (TWO) TIMES DAILY. 12/05/19   Martyn Ehrich, NP  [employer] ASPIRIN EC 325 MG EC tablet TAKE 1 TABLET BY MOUTH EVERY DAY Patient taking differently: Take 325 mg by mouth daily.  08/16/19   Vaslow, Acey Lav, MD  ipratropium-albuterol (DUONEB) 0.5-2.5 (3) MG/3ML SOLN Take 3 mLs by nebulization every 6 (six) hours as needed. Patient taking differently: Take 3 mLs by nebulization every 6 (six) hours as needed (sob).  11/21/19   Martyn Ehrich, NP  nicotine polacrilex (NICORETTE) 4 MG gum Take 1 each (4 mg total) by mouth as needed for smoking cessation. Patient not taking: Reported on 11/15/2019 07/25/19   Yopp, [employer] C, RPH-CPP     Vital Signs: BP 90/67   Pulse 82   Temp 98 F (36.7 C) (Axillary)   Resp 19   Ht 5\' 5"  (1.651 m)   Wt 102 lb 4.7 oz (46.4 kg)   SpO2 95%   BMI 17.02 kg/m   Physical Exam Vitals and nursing note reviewed.  Constitutional:      General: He is not in acute distress. Pulmonary:  Effort: Pulmonary effort is normal. No respiratory distress.     Breath sounds: Wheezing present.  Musculoskeletal:     Comments: (+) right BKA.  (+) LUE edema.  Skin:    General: Skin is warm and dry.     Comments: Left and right femoral puncture sites soft with mild tenderness, no active bleeding or hematoma.  Neurological:     Mental Status: He is alert.     Comments: Alert, awake, and oriented x3. Speech and comprehension intact. PERRL bilaterally. EOMs  intact bilaterally without nystagmus or subjective diplopia. No facial asymmetry. Tongue midline. Can spontaneously move all extremities. No pronator drift. Left DP 1+, right femoral 1+.     Imaging: DG CHEST PORT 1 VIEW  Result Date: 12/07/2019 CLINICAL DATA:  Dyspnea. EXAM: PORTABLE CHEST 1 VIEW COMPARISON:  December 06, 2019. FINDINGS: The heart size and mediastinal contours are within normal limits. Feeding tube is seen entering stomach. No pneumothorax or pleural effusion is noted. Left lung is clear. Ill-defined nodular opacity seen in right midlung is again noted and may represent focal inflammation. The visualized skeletal structures are unremarkable. IMPRESSION: Ill-defined nodular opacity seen in right midlung is again noted and may represent focal inflammation. Electronically Signed   By: Marijo Conception M.D.   On: 12/07/2019 08:40   DG Chest Port 1 View  Result Date: 12/06/2019 CLINICAL DATA:  Fluid overload.  Hypotension. EXAM: PORTABLE CHEST 1 VIEW COMPARISON:  Radiograph 11/28/2019.  CT 11/16/2019. FINDINGS: Enteric tube in place with tip and side-port below the diaphragm in the stomach. The heart is normal in size. Stable mediastinal contours with right hilar prominence corresponding to ill-defined soft tissue on CT. Widening of the upper mediastinal contours corresponds to post radiation change. Vascular stent in the region of the right carotid. Chronic volume loss in the left hemithorax. Slight increase in interstitial markings which may represent pulmonary edema. No significant pleural effusion. No pneumothorax. Nonspecific vague nodular opacity in the right mid lung. The bones are under mineralized. IMPRESSION: 1. Enteric tube in place with tip and side-port below the diaphragm in the stomach. 2. Slight increase in interstitial markings which may represent pulmonary edema given history of fluid overload. 3. Nonspecific ill-defined nodular opacity in the right mid lung. 4. Stable  radiographic appearance of post treatment related changes. Electronically Signed   By: Keith Rake M.D.   On: 12/06/2019 17:15    Labs:  CBC: Recent Labs    12/01/19 2315 12/04/19 0453 12/06/19 0019 12/07/19 0038  WBC 19.7* 15.5* 20.5* 12.3*  HGB 8.2* 8.9* 8.4* 6.6*  HCT 25.9* 27.8* 26.0* 21.0*  PLT 492* 558* 526* 511*    COAGS: Recent Labs    11/21/19 2158 11/28/19 0413  INR 1.2 1.1  APTT 28  --     BMP: Recent Labs    10/28/19 1935 10/28/19 1935 10/29/19 0401 10/29/19 0401 10/30/19 0542 10/30/19 0542 10/31/19 0523 11/09/19 2136 12/04/19 0453 12/05/19 0445 12/06/19 0019 12/07/19 0038  NA 136   < > 137   < > 132*   < > 135   < > 133* 133* 130* 135  K 4.2   < > 4.4   < > 4.4   < > 4.8   < > 4.2 4.9 4.8 3.9  CL 100   < > 102   < > 99   < > 99   < > 96* 94* 95* 105  CO2 25   < > 24   < >  25   < > 25   < > 26 30 25 23   GLUCOSE 90   < > 96   < > 150*   < > 141*   < > 114* 133* 165* 107*  BUN 10   < > 9   < > 11   < > 13   < > 17 14 16 13   CALCIUM 8.7*   < > 8.8*   < > 8.8*   < > 8.7*   < > 8.5* 8.7* 8.3* 7.0*  CREATININE 0.70   < > 0.81   < > 0.68   < > 0.64   < > 0.65 0.78 0.66 0.48*  GFRNONAA >60   < > >60   < > >60   < > >60   < > >60 >60 >60 >60  GFRAA >60  --  >60  --  >60  --  >60  --   --   --   --   --    < > = values in this interval not displayed.    LIVER FUNCTION TESTS: Recent Labs    11/28/19 0413 11/29/19 0334 11/30/19 0342 12/01/19 0513  BILITOT 0.4 0.4 0.4 0.3  AST 28 30 28 26   ALT 28 32 34 37  ALKPHOS 78 82 77 81  PROT 5.3* 5.5* 5.2* 5.3*  ALBUMIN 2.1* 2.1* 2.2* 2.2*    Assessment and Plan:  History of acute CVA (left temporooccipital infarct and R>L watershed infarcts) in setting of bilateral common carotid artery stenosis- thought to be cause of recent CVA (left-sided weakness- symptomatic from right CCA stenosis) s/p cerebral arteriogram with revascularization of right CCA stenosis using stent assisted angioplasty via left  (and attempted but unsuccessful right) femoral approach 12/06/2019 by Dr. Estanislado Pandy. Patient's neurologic function stable- A&Ox3, can spontaneously move all extremities. Left and right femoral puncture sites stable, however hgb down to 6.6 this AM (from 8.4 yesterday)- currently receiving blood transfusion per primary service, will obtain CT abdomen/pelvis to R/O retroperitoneal bleed. Please hold off on PT/OT until results from scan are back. Foley with clear gold urine this AM, no hematuria noted- continue taking Brilinta 30 mg twice daily and Aspirin 81 mg once daily. Ok to D/C arterial line at this time. Respiratory status stable- still with wheezing/rhonchi. Further plans per TRH/neurology/CCM/urology- appreciate and agree with management. NIR to follow.  ADDENDUM: CT a/p negative for retroperitoneal bleed/hematoma. Patient ok to work with PT/OT at this time. From NIR standpoint, patient stable to move out of ICU. Recommend follow-up with Dr. Estanislado Pandy on OP basis 4 weeks after discharge (NIR schedulers to call patient to set up this appointment). Recommend continuing with Brilinta 30 mg twice daily and Aspirin 81 mg once daily upon discharge (unless Eliquis required for new LUE DVT, then ok to D/C ASA as long as remains on Brilinta for stent protection). Dr. Pietro Cassis made aware of above. Please call NIR with questions/concerns.  Spoke with patient's son, Ryker [place], via telephone at [phone], to update on post-procedure. All questions answered and concerns addressed.   Electronically Signed: Earley Abide, PA-C 12/07/2019, 11:21 AM   I spent a total of 25 Minutes at the the patient's bedside AND on the patient's hospital floor or unit, greater than 50% of which was counseling/coordinating care for right CCA stenosis s/p revascularization.

## 2019-12-07 NOTE — Progress Notes (Addendum)
ANTICOAGULATION CONSULT NOTE   Pharmacy Consult:  Heparin Indication:  S/p carotid stenting  Allergies  Allergen Reactions  . Chantix [Varenicline] Other (See Comments)    Weird dreams    Patient Measurements: Height: 5\' 5"  (165.1 cm) Weight: 46.4 kg (102 lb 4.7 oz) IBW/kg (Calculated) : 61.5 Heparin Dosing Weight: 44 kg  Vital Signs: Temp: 98 F (36.7 C) (11/05 0900) Temp Source: Axillary (11/05 0900) BP: 94/74 (11/05 1500) Pulse Rate: 96 (11/05 1500)  Labs: Recent Labs    12/05/19 0445 12/06/19 0019 12/06/19 2029 12/07/19 0038  HGB  --  8.4*  --  6.6*  HCT  --  26.0*  --  21.0*  PLT  --  526*  --  511*  HEPARINUNFRC  --   --  <0.10*  --   CREATININE 0.78 0.66  --  0.48*    Estimated Creatinine Clearance: 58 mL/min (A) (by C-G formula based on SCr of 0.48 mg/dL (L)).  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  Now s/p stenting and patient to be anticoagulated with heparin until tomorrow AM.  Heparin started prior to transfer to [facility].  Baseline labs reviewed.  Heparin level was undetectable, on 550 units/hr. Hgb 6.6, plt 511 - currently undergoing transfusion. No s/sx of bleeding. Found to have acute DVT involving L IJ, subclavian, axillary vein and L brachial vein. Given low Hgb and need for transfusion, discussed with team and will hold on DOAC at this time.   Goal of Therapy:  Heparin level 0.3-0.5 units/ml Monitor platelets by anticoagulation protocol: Yes   Plan:  Will start heparin infusion at 700 units/hr without bolus Will order heparin level in 6 hr Monitor daily HL, CBC, and for s/sx of bleeding   Antonietta Jewel, PharmD, BCCCP Clinical Pharmacist  Phone: [phone] 12/07/2019 3:29 PM  Please check AMION for all [place] phone numbers After 10:00 PM, call [place] [phone]

## 2019-12-07 NOTE — Progress Notes (Signed)
  Speech Language Pathology Treatment: Dysphagia  Patient Details Name: Evan [name] MRN: [id number] DOB: January 01, 1951 Today's Date: 12/07/2019 Time: 1000-1032 SLP Time Calculation (min) (ACUTE ONLY): 32 min  Assessment / Plan / Recommendation Clinical Impression  Pt quite restless and uncomfortable. Reports burning pain in his throat. Respiration continues to be severely rhonchorus at baseline, remains stable during and after PO intake. Does not improve with coughing. Pt able to take sips of water and masticate solids without appearance of oral or pharyngeal impairment other than odynophagia. He is noted to have significant belching after each swallow and has a history of radiation to his chest. Question if an esophageal dysphagia could be resulting in postprandial aspiration or Laryngopharyngeal reflux. This may warrant further investigation if pts condition worsens with PO intake. For now, recommend pt initiaite  Mechanical soft diet with thin liquids with the goal of eating adequate oral nutrition to remove his NG tube which may decrease his discomfort. Will f/u for tolerance.   HPI HPI: Evan [name] is a 69 y.o. 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 Now with worsening dysphagia over past 24-48 hours.      SLP Plan  Continue with current plan of care       Recommendations  Diet recommendations: Dysphagia 3 (mechanical soft);Thin liquid Liquids provided via: Cup;Straw Medication Administration: Whole meds with liquid (may need to crush larger pills in puree) Supervision: Full supervision/cueing for compensatory  strategies Compensations: Follow solids with liquid;Slow rate;Small sips/bites Postural Changes and/or Swallow Maneuvers: Seated upright 90 degrees;Upright 30-60 min after meal                Oral Care Recommendations: Oral care BID Follow up Recommendations: 24 hour supervision/assistance;Skilled Nursing facility SLP Visit Diagnosis: Dysphagia, oropharyngeal phase (R13.12) Plan: Continue with current plan of care       GO               Herbie [place], MA [place] Pager [phone] Office [phone]  Lynann Beaver 12/07/2019, 10:35 AM

## 2019-12-07 NOTE — Progress Notes (Signed)
Occupational Therapy Treatment Patient Details Name: Evan [name] MRN: [id number] DOB: 12-07-1950 Today's Date: 12/07/2019    History of present illness 69 y.o. male with medical history significant of lung cancer apparently stage IIIa status post chemo now in remission, recent hospitalization with recurrent pneumonia, COPD, depression, left renal mass, anxiety disorder, and traumatic R BKA, presents with confusion, weakness, and mild aphasia. CT and MRI demonstrate new left parieto-occipital infarct. On 11/22/2019 pt developing agitation and respiratory distress requiring BiPAP and haldol. Pt underwent bilateral cerebral arteriogram on 11/23/2019 demonstrating severe bilateral common carotid artery stenosis at proximal/mid levels. 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    OT comments  Seen as co-treat with PT to attempt OOB mobility however pt declined. Focus of session mobilizing to EOB. Pt assisted with ADL tasks sitting EOB requiring min A to maintain balance and Max A with LB ADL. Max A sit - stand x 2 Fatigues easily with complaints of SOB however VSS on 2L with SpO2 remaining in high 90s. Will continue to follow acutely and recommend rehab at [facility].   Follow Up Recommendations  SNF;Supervision/Assistance - 24 hour    Equipment Recommendations  3 in 1 bedside commode    Recommendations for Other Services      Precautions / Restrictions Precautions Precautions: Fall Precaution Comments: R BKA, prosthesis in room       Mobility Bed Mobility Overal bed mobility: Needs Assistance       Supine to sit: Max assist Sit to supine: Mod assist   General bed mobility comments: Able to  bridge hips  Transfers Overall transfer level: Needs assistance   Transfers: Sit to/from Stand Sit to Stand: Max assist         General transfer comment: pt declined OOB to chair    Balance Overall balance assessment: Needs assistance   Sitting balance-Leahy Scale: Poor       Standing balance-Leahy  Scale: Zero                             ADL either performed or assessed with clinical judgement   ADL Overall ADL's : Needs assistance/impaired Eating/Feeding: NPO   Grooming: Set up;Sitting   Upper Body Bathing: Minimal assistance;Sitting   Lower Body Bathing: Maximal assistance;Bed level       Lower Body Dressing: Maximal assistance;Sit to/from stand               Functional mobility during ADLs: Maximal assistance;+2 for safety/equipment General ADL Comments: Stood wtih encouragement adn Max A. Second trial of stanidng - L knee buckling     Vision       Perception     Praxis      Cognition Arousal/Alertness: Awake/alert Behavior During Therapy: Flat affect;Anxious Overall Cognitive Status: Impaired/Different from baseline Area of Impairment: Attention;Problem solving;Awareness;Safety/judgement                 Orientation Level: Disoriented to;Time Current Attention Level: Sustained Memory: Decreased short-term memory Following Commands: Follows one step commands with increased time Safety/Judgement: Decreased awareness of safety;Decreased awareness of deficits Awareness: Emergent Problem Solving: Slow processing;Decreased initiation;Difficulty sequencing;Requires verbal cues General Comments: trying to put regular sock on his amputated leg        Exercises General Exercises - Upper Extremity Shoulder Flexion: Left;5 reps;AAROM;PROM;Supine Elbow Flexion: Left;5 reps;Supine;AROM;AAROM Elbow Extension: AROM;AAROM;Left;5 reps;Supine Digit Composite Flexion: AAROM;Left;10 reps;Supine;Squeeze ball Composite Extension: AROM;AAROM;Left;Supine   Shoulder Instructions       General Comments  Pertinent Vitals/ Pain       Pain Assessment: Faces Faces Pain Scale: Hurts little more Pain Location:  (generalized) Pain Descriptors / Indicators: Discomfort Pain Intervention(s): Limited activity within patient's tolerance  Home Living                                           Prior Functioning/Environment              Frequency  Min 2X/week        Progress Toward Goals  OT Goals(current goals can now be found in the care plan section)  Progress towards OT goals: Progressing toward goals  Acute Rehab OT Goals Patient Stated Goal: to improve mobility and activity tolerance OT Goal Formulation: With patient Time For Goal Achievement: 12/09/19 Potential to Achieve Goals: Good ADL Goals Pt Will Perform Grooming: with supervision;sitting Pt Will Perform Lower Body Bathing: with min assist;sitting/lateral leans Pt Will Perform Upper Body Dressing: with supervision;sitting Pt Will Perform Lower Body Dressing: with min assist;sitting/lateral leans Pt Will Transfer to Toilet: with min assist;with transfer board;squat pivot transfer;bedside commode Pt/caregiver will Perform Home Exercise Program: Increased strength;Increased ROM;Both right and left upper extremity;With written HEP provided Additional ADL Goal #1: Pt will tolerate sitting EOB >7 min at supervision level as precursor to EOB/OOB ADL.  Plan Discharge plan remains appropriate    Co-evaluation    PT/OT/SLP Co-Evaluation/Treatment: Yes Reason for Co-Treatment: Complexity of the patient's impairments (multi-system involvement);For patient/therapist safety;To address functional/ADL transfers   OT goals addressed during session: ADL's and self-care;Strengthening/ROM      AM-PAC OT "6 Clicks" Daily Activity     Outcome Measure   Help from another person eating meals?: Total (NPO) Help from another person taking care of personal grooming?: A Little Help from another person toileting, which includes using toliet, bedpan, or urinal?: Total Help from another person bathing (including washing, rinsing, drying)?: A Lot Help from another person to put on and taking off regular upper body clothing?: A Lot Help from another person to put on  and taking off regular lower body clothing?: Total 6 Click Score: 10    End of Session Equipment Utilized During Treatment: Oxygen (2L)  OT Visit Diagnosis: Muscle weakness (generalized) (M62.81);Other abnormalities of gait and mobility (R26.89);Hemiplegia and hemiparesis;Other symptoms and signs involving cognitive function;Pain Hemiplegia - Right/Left: Left Hemiplegia - dominant/non-dominant: Non-Dominant Hemiplegia - caused by: Cerebral infarction Pain - Right/Left:  (generalized ; coretrack)   Activity Tolerance Patient tolerated treatment well   Patient Left in bed;with call bell/phone within reach;with bed alarm set   Nurse Communication Mobility status        Time: 7425-9563 OT Time Calculation (min): 23 min  Charges: OT General Charges $OT Visit: 1 Visit OT Treatments $Self Care/Home Management : 8-22 mins  Maurie Boettcher, OT/L   Acute OT Clinical Specialist [place] Pager [phone] Office [phone]    [facility] 12/07/2019, 5:24 PM

## 2019-12-07 NOTE — Anesthesia Postprocedure Evaluation (Signed)
Anesthesia Post Note  Patient: Evan [name]  Procedure(s) Performed: STENT PLACMENT (N/A )     Patient location during evaluation: PACU Anesthesia Type: MAC Level of consciousness: awake and alert Pain management: pain level controlled Vital Signs Assessment: post-procedure vital signs reviewed and stable Respiratory status: spontaneous breathing, nonlabored ventilation, respiratory function stable and patient connected to nasal cannula oxygen Cardiovascular status: stable and blood pressure returned to baseline Postop Assessment: no apparent nausea or vomiting Anesthetic complications: no   No complications documented.  Last Vitals:  Vitals:   12/07/19 0900 12/07/19 0907  BP: (!) 89/68   Pulse: 88 (!) 103  Resp: 17 (!) 23  Temp: 36.7 C   SpO2: 100% 100%    Last Pain:  Vitals:   12/07/19 0900  TempSrc: Axillary  PainSc:                  [place]

## 2019-12-07 NOTE — Progress Notes (Signed)
Physical Therapy Treatment Patient Details Name: Evan [name] MRN: [id number] DOB: 07-31-1950 Today's Date: 12/07/2019    History of Present Illness 69 y.o. male with medical history significant of lung cancer apparently stage IIIa status post chemo now in remission, recent hospitalization with recurrent pneumonia, COPD, depression, left renal mass, anxiety disorder, and traumatic R BKA, presents with confusion, weakness, and mild aphasia. CT and MRI demonstrate new left parieto-occipital infarct. On 11/22/2019 pt developing agitation and respiratory distress requiring BiPAP and haldol. Pt underwent bilateral cerebral arteriogram on 11/23/2019 demonstrating severe bilateral common carotid artery stenosis at proximal/mid levels. 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     PT Comments    Pt was very fatigued and needed encouragement to participate today.  Left on bed percussion function due to high congestion level.  Emphasis on bed exercise, transition to EOB, sitting balance, sit to stand   Follow Up Recommendations  SNF;Supervision/Assistance - 24 hour     Equipment Recommendations  Other (comment) (TBA)    Recommendations for Other Services       Precautions / Restrictions Precautions Precautions: Fall Precaution Comments: R BKA, prosthesis in room    Mobility  Bed Mobility Overal bed mobility: Needs Assistance       Supine to sit: Max assist Sit to supine: Mod assist   General bed mobility comments: Able to  bridge hips  Transfers Overall transfer level: Needs assistance   Transfers: Sit to/from Stand Sit to Stand: Max assist         General transfer comment: pt declined OOB to chair  Ambulation/Gait                 Stairs             Wheelchair Mobility    Modified Rankin (Stroke Patients Only)       Balance Overall balance assessment: Needs assistance   Sitting balance-Leahy Scale: Poor       Standing balance-Leahy Scale: Zero                               Cognition Arousal/Alertness: Awake/alert Behavior During Therapy: Flat affect;Anxious Overall Cognitive Status: Impaired/Different from baseline Area of Impairment: Attention;Problem solving;Awareness;Safety/judgement                 Orientation Level: Disoriented to;Time Current Attention Level: Sustained Memory: Decreased short-term memory Following Commands: Follows one step commands with increased time Safety/Judgement: Decreased awareness of safety;Decreased awareness of deficits Awareness: Emergent Problem Solving: Slow processing;Decreased initiation;Difficulty sequencing;Requires verbal cues General Comments: trying to put regular sock on his amputated leg      Exercises General Exercises - Upper Extremity Shoulder Flexion: Left;5 reps;AAROM;PROM;Supine Elbow Flexion: Left;5 reps;Supine;AROM;AAROM Elbow Extension: AROM;AAROM;Left;5 reps;Supine Digit Composite Flexion: AAROM;Left;10 reps;Supine;Squeeze ball Composite Extension: AROM;AAROM;Left;Supine Other Exercises Other Exercises: hip/knee flex/ext with  AA to resistive ROM x 10 reps    General Comments        Pertinent Vitals/Pain Pain Assessment: Faces Faces Pain Scale: Hurts little more Pain Location:  (generalized) Pain Descriptors / Indicators: Discomfort Pain Intervention(s): Limited activity within patient's tolerance    Home Living                      Prior Function            PT Goals (current goals can now be found in the care plan section) Acute Rehab PT Goals Patient Stated Goal: to improve mobility  and activity tolerance PT Goal Formulation: With patient/family Time For Goal Achievement: 12/08/19 Potential to Achieve Goals: Fair Progress towards PT goals: Progressing toward goals    Frequency    Min 3X/week      PT Plan Current plan remains appropriate    Co-evaluation PT/OT/SLP Co-Evaluation/Treatment: Yes Reason for Co-Treatment:  Complexity of the patient's impairments (multi-system involvement);For patient/therapist safety;To address functional/ADL transfers PT goals addressed during session: Mobility/safety with mobility OT goals addressed during session: ADL's and self-care      AM-PAC PT "6 Clicks" Mobility   Outcome Measure  Help needed turning from your back to your side while in a flat bed without using bedrails?: Total Help needed moving from lying on your back to sitting on the side of a flat bed without using bedrails?: A Lot Help needed moving to and from a bed to a chair (including a wheelchair)?: Total Help needed standing up from a chair using your arms (e.g., wheelchair or bedside chair)?: Total Help needed to walk in hospital room?: Total Help needed climbing 3-5 steps with a railing? : Total 6 Click Score: 7    End of Session   Activity Tolerance: Patient limited by lethargy;Patient limited by fatigue Patient left: in bed;with call bell/phone within reach;with bed alarm set;with SCD's reapplied (place on bed percussion  low intensity) Nurse Communication: Mobility status PT Visit Diagnosis: Other abnormalities of gait and mobility (R26.89);Muscle weakness (generalized) (M62.81);Other symptoms and signs involving the nervous system (R29.898);Hemiplegia and hemiparesis Hemiplegia - Right/Left: Left Hemiplegia - dominant/non-dominant: Non-dominant Hemiplegia - caused by: Cerebral infarction     Time: 5631-4970 PT Time Calculation (min) (ACUTE ONLY): 23 min  Charges:  $Neuromuscular Re-education: 8-22 mins                     12/07/2019  Evan [name]., PT Acute Rehabilitation Services [phone]  (pager) [phone]  (office)   Evan [name] Evan [name] 12/07/2019, 6:28 PM

## 2019-12-07 NOTE — Progress Notes (Signed)
STROKE TEAM PROGRESS NOTE   INTERVAL HISTORY Son and vascular tech are at the bedside. Pt found to have severe anemia with Hb 6.6. However, he also has right UE swollen throughout. Seems to have mild worsening of LUE weakness but largely due LUE diffuse swollen. Had left UE venous doppler showed unchanged left IJ, subclavian, axillary and brachial DVT. Also found to have acute left basilic vein superficial vein thrombosis. Given the LUE changes, started on heparin IV although severe anemia with risk and benefit consideration. Case discussed with Dr. Pietro Cassis.   Vitals:   12/07/19 0835 12/07/19 0845 12/07/19 0900 12/07/19 0907  BP:   (!) 89/68   Pulse:  100 88 (!) 103  Resp:  (!) 24 17 (!) 23  Temp:  98 F (36.7 C) 98 F (36.7 C)   TempSrc:  Oral Axillary   SpO2: 100% 100% 100% 100%  Weight:      Height:       CBC:  Recent Labs  Lab 12/06/19 0019 12/07/19 0038  WBC 20.5* 12.3*  NEUTROABS  --  10.7*  HGB 8.4* 6.6*  HCT 26.0* 21.0*  MCV 87.5 89.0  PLT 526* 696*   Basic Metabolic Panel:  Recent Labs  Lab 12/05/19 0445 12/05/19 0445 12/06/19 0019 12/07/19 0038  NA 133*   < > 130* 135  K 4.9   < > 4.8 3.9  CL 94*   < > 95* 105  CO2 30   < > 25 23  GLUCOSE 133*   < > 165* 107*  BUN 14   < > 16 13  CREATININE 0.78   < > 0.66 0.48*  CALCIUM 8.7*   < > 8.3* 7.0*  MG 2.1  --  2.0  --   PHOS 3.9  --  3.8  --    < > = values in this interval not displayed.    IMAGING past24h DG CHEST PORT 1 VIEW  Result Date: 12/07/2019 CLINICAL DATA:  Dyspnea. EXAM: PORTABLE CHEST 1 VIEW COMPARISON:  December 06, 2019. FINDINGS: The heart size and mediastinal contours are within normal limits. Feeding tube is seen entering stomach. No pneumothorax or pleural effusion is noted. Left lung is clear. Ill-defined nodular opacity seen in right midlung is again noted and may represent focal inflammation. The visualized skeletal structures are unremarkable. IMPRESSION: Ill-defined nodular opacity seen in  right midlung is again noted and may represent focal inflammation. Electronically Signed   By: Marijo Conception M.D.   On: 12/07/2019 08:40   DG Chest Port 1 View  Result Date: 12/06/2019 CLINICAL DATA:  Fluid overload.  Hypotension. EXAM: PORTABLE CHEST 1 VIEW COMPARISON:  Radiograph 11/28/2019.  CT 11/16/2019. FINDINGS: Enteric tube in place with tip and side-port below the diaphragm in the stomach. The heart is normal in size. Stable mediastinal contours with right hilar prominence corresponding to ill-defined soft tissue on CT. Widening of the upper mediastinal contours corresponds to post radiation change. Vascular stent in the region of the right carotid. Chronic volume loss in the left hemithorax. Slight increase in interstitial markings which may represent pulmonary edema. No significant pleural effusion. No pneumothorax. Nonspecific vague nodular opacity in the right mid lung. The bones are under mineralized. IMPRESSION: 1. Enteric tube in place with tip and side-port below the diaphragm in the stomach. 2. Slight increase in interstitial markings which may represent pulmonary edema given history of fluid overload. 3. Nonspecific ill-defined nodular opacity in the right mid lung. 4. Stable radiographic appearance  of post treatment related changes. Electronically Signed   By: Keith Rake M.D.   On: 12/06/2019 17:15     PHYSICAL EXAM     Temp:  [97.8 F (36.6 C)-98 F (36.7 C)] 98 F (36.7 C) (11/05 0900) Pulse Rate:  [40-111] 103 (11/05 0907) Resp:  [14-31] 23 (11/05 0907) BP: (69-131)/(40-104) 89/68 (11/05 0900) SpO2:  [92 %-100 %] 100 % (11/05 0907) Arterial Line BP: (81-115)/(49-63) 104/54 (11/05 0907) Weight:  [46.4 kg] 46.4 kg (11/05 0500)  General -frail cachectic malnourished looking elderly male in no apparent distress. He has right below-knee amputation.  Ophthalmologic - fundi not visualized due to noncooperation.  Cardiovascular - Regular rhythm and rate.  Respiratory  - b/l Rhonchi, secretion in throat with weak cough.  Neuro stable - awake alert, oriented x3.  No aphasia, hypophonic speech with mild dysarthria weak cough, able to name and repeat.  Follows commands.  No gaze preference.  Mild left facial droop, tongue midline.  Left upper extremity 3-/5 throughout. Significant weakness of left hand intrinsic hand muscles. Right upper extremity at least 4/5.  Bilateral lower extremity at least 4/5 with right BKA.  Sensation symmetrical.  Finger-to-nose intact on the right.  Gait not tested.   ASSESSMENT/PLAN Evan [name] is a 69 y.o. male with history of recurrent non small cell lung cancer (stage IIIa status post chemo and radiation), traumatic right knee BKA, recent recurrent pneumonia, left renal mass, known prior bilateral carotid artery stenosis, COPD w/ recent exacerbation d/c 10/19. He has had generalized weakness and poor sleep, chronic swallowing, wt loss with poor apptetite and his son and dtr recently moved in to help him. Plans for prostate bx for which he had stopped his aspirin. On 10/20 presented to [facility] ED with difficulty w/ spatial awareness and vision guided movements, HA, L sided weakness. Transfered to [employer]. [facility].  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 Additional scattered cortical + subcortical B watershed infarcts. Min petechial hemorrhage in L temporooccipital infarct. Old tiny L cerebellar infarct  CTA head & neck no LVO. 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 suspicion for L IJ thrombosis. B ICA siphon w/ mild stenosis. LLL opacity.   MRA head  Unremarkable   Cerebral angio confirmed bilateral CCA high-grade stenosis  2D Echo EF 55 to 60%  LDL 154   HgbA1c 6.3  VTE prophylaxis - Lovenox 40 mg sq daily   off  aspirin for planned prostate bx prior to admission, now on brilinta 30 bid and heparin IV.  Therapy recommendations:  SNF  Disposition:  pending   Palliative following   Recurrent PNA and Respiratory Distress / Agitation with concern for aspiration COPD  Increased WOB 10/21 evening  Put on BIPAP w/ relief  Changed level to progressive care   Treated w/ ativan and haldol. Good relief w/ haldol  Constant rhonchi but not in distress  No dysarthria  On breathing treatment bid   Carotid Stenosis   CTA neck 02/2017 Long segment of severe 80% right and severe 90% left proximal  common carotid artery stenosis.  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  Cerebral angio confirmed bilateral CCA high-grade stenosis  S/p R CCA stenting 12/06/19   Now on brilinta 30 bid and heparin IV for DVT  Acute Blood Loss Anemia  HGB 11.3->10.1->8.7.Marland KitchenMarland Kitchen8.4->6.6->PRBC  CT abd & pelvis no retroperitoneal bleeding   Transfuse 2U PRBC   CBC monitoring  L parietooccipital lesion  MRI w/ severely degraded, enhancement L temporooccipital lobe, repeat recommended  Likely due to infarction  MRI with contrast nondiagnostic - motion due to SOB lying flat  LUE DVT  No prior line placement in neck per family, history of for Port-A-Cath on right chest per patient  CTA neck in 2019 also suspicious for left IJ thrombosis, however not for certain - discussed with Dr. 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 Chronic   LUE venous doppler 10/21 c/w age indeterminate deep vein thrombosis involving the left internal jugular vein, left subclavian vein, left axillary vein and left brachial veins   Feel the L IJ thrombosis is more likely chronic, given his complicated other medical conditions, did not treat at that time   Chest CTA 11/16/19 - No evidence of pulmonary emboli.  11/5 - LUE swollen - LUE venous doppler unchanged left internal jugular vein,  left subclavian vein, left axillary vein and left brachial veins DVT but also left basilic vein SVT  Given acute change of LUE swelling, decided to treat DVT with heparin IV 12/07/19  History of hypertension Hypotension  Recurrent low BP   Bolus IVF followed by albumin w/ IVF at 10/22 . No apparent CHF history . Long-term BP goal 130-150 given carotid stenosis.   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   recurrence on the left side presented as a stage IIIa non-small cell lung cancer, squamous cell carcinoma status post concurrent chemoradiation followed by consolidation chemotherapy. Completed in March 2017.  He also underwent SBRT to the right middle lobe lung nodule under the care of Dr. [name] completed on 07/24/2018.  He also underwent SBRT to left lower lobe lung nodule under the care of Dr. [name] completed on April 27, 2019.  Recent scan showed no concerning findings for disease progression  Followed with Dr. Julien Nordmann  Other Stroke Risk Factors  Advanced age  Cigarette smoker, quit 7 mos ago, advised to stop smoking  Other Active Problems  Recent COPD exacerbation, d/c 10/19. Nebs, pulmonary toilet for respiratory distress. On mucomyst  Recent recurrent PNA.   Marked enlargement of the Prostate w/ plans for bx    FTT. Cachexia, Body mass index is 17.02 kg/m.   Traumatic right BKA  Tachycardia   Leukocytosis 10.6 . . . . . . 20.5->12.3 (afebrile) on prednisolone  TSH high, free T4 normal   Hyponatremia - Na - 130->135  Palliative care following pt.  Code status - Full code  Rosalin Hawking, MD PhD Stroke Neurology 75/02/256 5:27 AM  Complicated medical situations with DVT, severe  anemia, hypotension, s/p stent needing antiplatelet. I discussed with Dr. Pietro Cassis. I spent  35 minutes in total face-to-face time with the patient, more than 50% of which was spent in counseling and coordination of care, reviewing test results, images and medication, and discussing the diagnosis, treatment plan and potential prognosis. This patient's care requiresreview of multiple databases, neurological assessment, discussion with family, other specialists and medical decision making of high complexity.  Rosalin Hawking, MD PhD  Stroke Neurology 12/07/2019 10:05 PM    To contact Stroke Continuity provider, please refer to [email or web address] After hours, contact General Neurology

## 2019-12-07 NOTE — Progress Notes (Signed)
PROGRESS NOTE  Evan [name]  DOB: 05-08-1950  PCP: Patient, No Pcp Per FOY:[id number]  DOA: 11/21/2019  LOS: 16 days   Chief complaint: Confusion, weakness  Brief narrative: Evan [name] is a 69 y.o. 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 Patient presented to the ED on 10/20 with significant confusion, weakness for 24 hours.  He was found to have CVA with carotid stenosis and currently waiting for stenting. Stent placement was on hold due to significant abnormal breath sounds. His respiratory status subsequently improved.   11/3, he was scheduled for stent placement but it got postponed because of another emergency case.   Due to poor nutrition and oral intake, feeding tube placed 11/30/2019.   Subjective: Patient was seen and examined this morning I noted that overnight his blood pressure running low but mental status was intact.  He has persistent gurgling and poor cough reflex, on 2 L oxygen by nasal cannula. Hemoglobin dropped this morning to 6.6, 2 units PRBC was transfused. Later this morning, CT scan of abdomen pelvis was obtained which did not show any retroperitoneal bleed but showed bilateral lower lobe aspiration pneumonia. Also, he now has left upper extremity DVT probably related to his cancer.  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   -Imaging showed acute ischemic infarct involving the left temporoccipital region, -LDL 154, A1c 6.3.  Echocardiogram EF 55 to 60%.  CTA head and neck did not show LVO but showed severe bilateral carotid artery stenosis therefore IR consulted for stent placement. -11/4,  underwent right carotid artery stenting by neuro IR.  -Because of coexisting DVT, patient will be started on Eliquis and continued on Brilinta, okay to stop aspirin.  Discussed with neuro and neuro IR.  Acute respiratory failure with hypoxia History of COPD Stage III lung cancer -During this hospitalization, patient had bilateral diffuse coarse breath sounds from aspiration pneumonia and upper airway secretion.   -Pulmonary consultation was obtained.  Patient's respiratory status improved with bronchodilators, scopolamine patch, incentive spirometry, flutter valve, Mucinex and tapering course of steroids. -However post carotid stenting, patient is having increased secretions and diffuse rhonchi again. -On my examination, I have noticed that most of the abnormal breath sound is transmitted throat sound.  He has poor cough reflex.  He is probably aspirating his secretions because of the same.  -CT scanning this morning shows bilateral lung base infiltrates likely related to aspiration. -Continue supportive measures.-Continue bronchodilators, scopolamine patch. -Completed 7-day course of Unasyn.  However with persistent bilateral lung base infiltrates, I initiated the patient on Unasyn IV again.  Acute DVT of left upper extremity Chronic left IJ thrombosis -CT angio of neck from 10/20 showed chronic findings suspicious for left IJ thrombosis but at the time, patient did not have any swelling of the left upper extremity.  However, this morning, patient is noted to have significant swelling of left upper extremity. -Ultrasound duplex was obtained which showed acute DVT involving the left internal jugular vein, left subclavian vein, left jugular vein, left brachial veins and left basilic vein. -Discussed with neuro and neuro IR.  We will start the patient on Eliquis.  Also continue on Brilinta because of stent.  Acute anemia Hematuria -During this hospitalization, patient  had hematuria related to  traumatic Foley placement.  It resolved and patient is no longer having hematuria.  Foley catheter changed on 11/28/2019 -However, his hemoglobin is down to 6.6 today a drop from 8.4 in 24 hours.  No obvious source of bleeding.  CT abdomen pelvis today did not show any evidence of retroperitoneal bleed. -2 units of PRBCs transfused.  Continue to monitor hemoglobin especially in light of new requirement of anticoagulation because of DVT. Recent Labs    11/22/19 0347 11/23/19 0225 11/25/19 0450 11/26/19 1021 11/27/19 0431 11/28/19 0413 12/01/19 2315 12/04/19 0453 12/06/19 0019 12/07/19 0038  HGB 10.8* 10.8* 11.7* 11.3* 10.1* 8.7* 8.2* 8.9* 8.4* 6.6*   Postprocedural hypotension -Since carotid artery stenting yesterday, patient blood pressure has been running low mostly in 80s.  He was given normal saline at 75 mill per hour overnight.  He also received 2 units of PRBCs this morning.  Blood pressure is gradually improving now.  Not in any blood pressure meds.  Severe protein calorie malnutrition -Temporary feeding tube placed 11/30/2019 for nutritional augmentation.  No evidence of dysphagia..  -Encourage oral appetite.  On regular diet.  Okay to pull out core track.  Mobility: PT evaluation appreciated Code Status:   Code Status: Full Code  Nutritional status: Body mass index is 17.02 kg/m. Nutrition Problem: Severe Malnutrition Etiology: chronic illness, cancer and cancer related treatments Signs/Symptoms: severe muscle depletion, severe fat depletion Percent weight loss: 19.8 % (x7 months) Diet Order            DIET DYS 3 Room service appropriate? Yes; Fluid consistency: Thin  Diet effective now                 DVT prophylaxis: Eliquis  Antimicrobials:  IV Unasyn started today. Fluid: None Consultants: Neuroradiology Family Communication:  Called and updated daughter on the phone.  Status is: Inpatient  Remains inpatient appropriate because, pending stent placement  today   Dispo: The patient is from: Home              Anticipated d/c is to: SNF recommended by PT              Anticipated d/c date is: 2 to 3 days              Patient currently is not medically stable to d/c.   Infusions:  . sodium chloride 10 mL/hr at 12/07/19 0400  . sodium chloride Stopped (12/07/19 0808)  . ampicillin-sulbactam (UNASYN) IV    . feeding supplement (OSMOLITE 1.2 CAL) 60 mL/hr at 12/06/19 0600    Scheduled Meds: .  stroke: mapping our early stages of recovery book   Does not apply Once  . arformoterol  15 mcg Nebulization BID  . atorvastatin  80 mg Oral Daily  . budesonide (PULMICORT) nebulizer solution  0.25 mg Nebulization BID  . chlorhexidine  15 mL Mouth/Throat Once  . Chlorhexidine Gluconate Cloth  6 each Topical Daily  . feeding supplement  237 mL Oral TID BM  . feeding supplement (PROSource TF)  45 mL Per Tube Daily  . guaiFENesin  600 mg Oral BID  . insulin aspart  0-6 Units Subcutaneous Q4H  . multivitamin with minerals  1 tablet Oral Daily  . nicotine  14 mg Transdermal Daily  . pantoprazole  40 mg Oral BID  . prednisoLONE  40 mg Oral Daily  . revefenacin  175 mcg Nebulization Daily  . scopolamine  1 patch Transdermal Q72H  . tamsulosin  0.4 mg Oral QHS  . ticagrelor  30 mg Oral BID   Or  . ticagrelor  30 mg Per Tube BID    Antimicrobials: Anti-infectives (From admission, onward)   Start     Dose/Rate Route Frequency Ordered Stop   12/07/19 1500  Ampicillin-Sulbactam (UNASYN) 3 g in sodium chloride 0.9 % 100 mL IVPB        3 g 200 mL/hr over 30 Minutes Intravenous Every 8 hours 12/07/19 1404     11/28/19 0000  ceFAZolin (ANCEF) IVPB 2g/100 mL premix  Status:  Discontinued        2 g 200 mL/hr over 30 Minutes Intravenous 60 min pre-op 11/27/19 1730 11/28/19 0857   11/28/19 0000  ceFAZolin (ANCEF) IVPB 2g/100 mL premix  Status:  Discontinued        2 g 200 mL/hr over 30 Minutes Intravenous 60 min pre-op 11/27/19 1730 11/27/19 1733    11/25/19 1530  Ampicillin-Sulbactam (UNASYN) 3 g in sodium chloride 0.9 % 100 mL IVPB        3 g 200 mL/hr over 30 Minutes Intravenous Every 8 hours 11/25/19 1432 12/02/19 2359      PRN meds: acetaminophen **OR** acetaminophen (TYLENOL) oral liquid 160 mg/5 mL **OR** acetaminophen, albuterol, hydrOXYzine, LORazepam, senna-docusate   Objective: Vitals:   12/07/19 1300 12/07/19 1400  BP: (!) 84/71 103/87  Pulse: 94 96  Resp: (!) 27 (!) 27  Temp:    SpO2: 99% 100%    Intake/Output Summary (Last 24 hours) at 12/07/2019 1422 Last data filed at 12/07/2019 1200 Gross per 24 hour  Intake 2609.16 ml  Output 1350 ml  Net 1259.16 ml   Filed Weights   12/05/19 0500 12/06/19 0330 12/07/19 0500  Weight: 45.8 kg 44.8 kg 46.4 kg   Weight change: 1.6 kg Body mass index is 17.02 kg/m.   Physical Exam: General exam: Appears calm and comfortable.  Not in physical distress Skin: No rashes, lesions or ulcers.  HEENT: Atraumatic, normocephalic, supple neck, no obvious bleeding.  Core track in place Lungs: Significantly transmitted throat sound to both lung fields.  Bilateral lung bases also have mild crackles CVS: Regular rate and rhythm, no murmur GI/Abd soft, nontender, nondistended, bowel sound present CNS: Alert, awake, oriented to place and person Psychiatry: Mood appropriate Extremities: Left upper extremities significantly swollen.  Data Review: I have personally reviewed the laboratory data and studies available.  Recent Labs  Lab 12/01/19 2315 12/04/19 0453 12/06/19 0019 12/07/19 0038  WBC 19.7* 15.5* 20.5* 12.3*  NEUTROABS  --   --   --  10.7*  HGB 8.2* 8.9* 8.4* 6.6*  HCT 25.9* 27.8* 26.0* 21.0*  MCV 85.5 86.9 87.5 89.0  PLT 492* 558* 526* 511*   Recent Labs  Lab 12/01/19 2315 12/01/19 2315 12/03/19 0223 12/04/19 0453 12/05/19 0445 12/06/19 0019 12/07/19 0038  NA 134*   < > 132* 133* 133* 130* 135  K 4.7   < > 4.7 4.2 4.9 4.8 3.9  CL 97*   < > 96* 96* 94* 95*  105  CO2 29   < > 26 26 30 25 23   GLUCOSE 207*   < > 92 114* 133* 165* 107*  BUN 16   < > 14 17 14 16 13   CREATININE 0.66   < > 0.52* 0.65 0.78 0.66 0.48*  CALCIUM 8.2*   < > 8.1* 8.5* 8.7* 8.3* 7.0*  MG 2.0  --  2.2 2.1 2.1 2.0  --   PHOS  2.5  --  3.3 3.5 3.9 3.8  --    < > = values in this interval not displayed.    F/u labs ordered.  Signed, Terrilee Croak, MD [facility] 12/07/2019

## 2019-12-08 LAB — CBC
HCT: 34.2 % — ABNORMAL LOW (ref 39.0–52.0)
Hemoglobin: 11.3 g/dL — ABNORMAL LOW (ref 13.0–17.0)
MCH: 27.7 pg (ref 26.0–34.0)
MCHC: 33 g/dL (ref 30.0–36.0)
MCV: 83.8 fL (ref 80.0–100.0)
Platelets: 503 10*3/uL — ABNORMAL HIGH (ref 150–400)
RBC: 4.08 MIL/uL — ABNORMAL LOW (ref 4.22–5.81)
RDW: 19.2 % — ABNORMAL HIGH (ref 11.5–15.5)
WBC: 18.4 10*3/uL — ABNORMAL HIGH (ref 4.0–10.5)
nRBC: 0.1 % (ref 0.0–0.2)

## 2019-12-08 LAB — BPAM RBC
Blood Product Expiration Date: 202112012359
Blood Product Expiration Date: 202112042359
ISSUE DATE / TIME: 202111050520
ISSUE DATE / TIME: 202111050857
Unit Type and Rh: 5100
Unit Type and Rh: 5100

## 2019-12-08 LAB — HEPARIN LEVEL (UNFRACTIONATED)
Heparin Unfractionated: <0.1 IU/mL — ABNORMAL LOW (ref 0.30–0.70)
Heparin Unfractionated: <0.1 IU/mL — ABNORMAL LOW (ref 0.30–0.70)

## 2019-12-08 LAB — GLUCOSE, CAPILLARY
Glucose-Capillary: 107 mg/dL — ABNORMAL HIGH (ref 70–99)
Glucose-Capillary: 109 mg/dL — ABNORMAL HIGH (ref 70–99)
Glucose-Capillary: 115 mg/dL — ABNORMAL HIGH (ref 70–99)
Glucose-Capillary: 129 mg/dL — ABNORMAL HIGH (ref 70–99)
Glucose-Capillary: 77 mg/dL (ref 70–99)
Glucose-Capillary: 87 mg/dL (ref 70–99)
Glucose-Capillary: 96 mg/dL (ref 70–99)

## 2019-12-08 LAB — TYPE AND SCREEN
ABO/RH(D): O POS
Antibody Screen: NEGATIVE
Unit division: 0
Unit division: 0

## 2019-12-08 MED ORDER — QUETIAPINE FUMARATE 50 MG PO TABS
25.0000 mg | ORAL_TABLET | Freq: Two times a day (BID) | ORAL | Status: DC
  Administered 2019-12-08 – 2019-12-12 (×8): 25 mg via ORAL
  Filled 2019-12-08 (×9): qty 1

## 2019-12-08 NOTE — Progress Notes (Signed)
PROGRESS NOTE  Evan [name]  DOB: Mar 01, 1950  PCP: Patient, No Pcp Per PIR:[id number]  DOA: 11/21/2019  LOS: 17 days   Chief complaint: Confusion, weakness  Brief narrative: Evan [name] is a 69 y.o. 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 Patient presented to the ED on 10/20 with significant confusion, weakness for 24 hours.  He was found to have CVA with carotid stenosis. Carotid stenting was recommended but was delayed because of his poor respiratory status and poor nutritional status.  Core track feeding was started to augment nutrition while pending carotid stenting.  Respiratory status subsequently improved and he underwent right carotid stenting on 11/4.  Postprocedure, his hospital course is complicated with low blood pressure, drop in hemoglobin, altered mental status, poor cough reflex, persistent gurgling of secretions and aspiration pneumonia.    Subjective: Patient was seen and examined this morning. Event from last night noted.  Patient was confused, restless and aggressive towards the staff. This morning, he remains confused but easily orientable.  On 2 L oxygen by nasal cannula.  Has persistent gurgling sound on the throat which is transmitted all over the chest.  Breath sounds improved when he is asked to cough but he does not have a strong cough reflex.  Hemoglobin has improved this morning I noted that overnight his blood pressure running low but mental status was intact. He has persistent gurgling and poor cough reflex, on 2 L oxygen by nasal cannula. Hemoglobin dropped this morning to 6.6, 2 units PRBC was transfused. Later this morning, CT scan of abdomen pelvis was obtained which did not show any  retroperitoneal bleed but showed bilateral lower lobe aspiration pneumonia. Also, he now has left upper extremity DVT probably related to his cancer.  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   -Imaging showed acute ischemic infarct involving the left temporoccipital region, -LDL 154, A1c 6.3.  Echocardiogram EF 55 to 60%.  CTA head and neck did not show LVO but showed severe bilateral carotid artery stenosis therefore IR consulted for stent placement. -11/4, underwent right carotid artery stenting by neuro IR.  -Because of coexisting DVT, patient has been started on heparin drip and Brilinta.    Acute respiratory failure with hypoxia History of COPD Stage III lung cancer -During this hospitalization, patient had bilateral diffuse coarse breath sounds from aspiration pneumonia and upper airway secretion.   -Pulmonary consultation was obtained.  Patient's respiratory status improved with bronchodilators, scopolamine patch, incentive spirometry, flutter valve, Mucinex and tapering course of steroids. -However post carotid stenting, patient is having increased secretions and diffuse rhonchi again. -He has poor cough reflex and keeps on gurgling on his secretions.  He is aspirating because of the same.  Currently on IV Unasyn and 2 L oxygen by nasal cannula.  -Continue bronchodilators, scopolamine patch.  Acute delirium Acute metabolic encephalopathy -Multifactorial: Prolonged hospitalization, stroke, hypoxia. -He has significant risk factors for dementia and cognitive impairment which probably is flaring up now. -Start on Seroquel 25 mg twice daily this morning.  May need further adjustment -Okay to use restraints.  Acute DVT of left upper extremity Chronic left IJ thrombosis -CT angio of neck from 10/20 showed chronic findings suspicious for left IJ thrombosis but  at the time,  patient did not have any swelling of the left upper extremity.  However, this morning, patient is noted to have significant swelling of left upper extremity. -Ultrasound duplex was obtained which showed acute DVT involving the left internal jugular vein, left subclavian vein, left jugular vein, left brachial veins and left basilic vein. -Discussed with neuro and neuro IR.  Currently on heparin drip.  Acute anemia Hematuria -During this hospitalization, patient had hematuria related to traumatic Foley placement.  It resolved and patient is no longer having hematuria.  Foley catheter changed on 11/28/2019 -However, his hemoglobin dropped down to the lowest of 6.6 on 11/5.  He got 2 units of PRBC transfusion subsequent with subsequent improvement in hemoglobin to 11.3 -11/5 CT abdomen pelvis today did not show any evidence of retroperitoneal bleed. -Continue to monitor hemoglobin especially in light of new requirement of anticoagulation because of DVT. Recent Labs    11/23/19 0225 11/25/19 0450 11/26/19 1021 11/27/19 0431 11/28/19 0413 12/01/19 2315 12/04/19 0453 12/06/19 0019 12/07/19 0038 12/08/19 0045  HGB 10.8* 11.7* 11.3* 10.1* 8.7* 8.2* 8.9* 8.4* 6.6* 11.3*   Postprocedural hypotension -Resolved with IV fluids.  Stop IV fluid today because of unstable bruising status.    Severe protein calorie malnutrition -Temporary feeding tube placed 11/30/2019 for nutritional augmentation.  -He was given feeding through core track to augment nutrition.  Core track pull out on 11/5.  Currently on oral feeding.  Mobility: PT evaluation appreciated Code Status:   Code Status: Full Code  Nutritional status: Body mass index is 16.58 kg/m. Nutrition Problem: Severe Malnutrition Etiology: chronic illness, cancer and cancer related treatments Signs/Symptoms: severe muscle depletion, severe fat depletion Percent weight loss: 19.8 % (x7 months) Diet Order            DIET DYS 3 Room service  appropriate? Yes; Fluid consistency: Thin  Diet effective now                 DVT prophylaxis: Eliquis  Antimicrobials:  IV Unasyn started today. Fluid: None Consultants: Neuroradiology Family Communication:  Called and updated daughter on the phone.  Status is: Inpatient  Remains inpatient appropriate because, pending stent placement today   Dispo: The patient is from: Home              Anticipated d/c is to: SNF recommended by PT              Anticipated d/c date is: 2 to 3 days              Patient currently is not medically stable to d/c.   Infusions:  . ampicillin-sulbactam (UNASYN) IV Stopped (12/08/19 0700)  . feeding supplement (OSMOLITE 1.2 CAL) 60 mL/hr at 12/06/19 0600  . heparin 850 Units/hr (12/08/19 0900)    Scheduled Meds: .  stroke: mapping our early stages of recovery book   Does not apply Once  . arformoterol  15 mcg Nebulization BID  . atorvastatin  80 mg Oral Daily  . budesonide (PULMICORT) nebulizer solution  0.25 mg Nebulization BID  . chlorhexidine  15 mL Mouth/Throat Once  . Chlorhexidine Gluconate Cloth  6 each Topical Daily  . feeding supplement  237 mL Oral TID BM  . feeding supplement (PROSource TF)  45 mL Per Tube Daily  . guaiFENesin  600 mg Oral BID  . insulin aspart  0-6 Units Subcutaneous Q4H  . multivitamin with minerals  1 tablet Oral Daily  . nicotine  14 mg Transdermal  Daily  . pantoprazole  40 mg Oral BID  . prednisoLONE  40 mg Oral Daily  . QUEtiapine  25 mg Oral BID  . revefenacin  175 mcg Nebulization Daily  . scopolamine  1 patch Transdermal Q72H  . tamsulosin  0.4 mg Oral QHS  . ticagrelor  30 mg Oral BID   Or  . ticagrelor  30 mg Per Tube BID    Antimicrobials: Anti-infectives (From admission, onward)   Start     Dose/Rate Route Frequency Ordered Stop   12/07/19 1500  Ampicillin-Sulbactam (UNASYN) 3 g in sodium chloride 0.9 % 100 mL IVPB        3 g 200 mL/hr over 30 Minutes Intravenous Every 8 hours 12/07/19 1404      11/28/19 0000  ceFAZolin (ANCEF) IVPB 2g/100 mL premix  Status:  Discontinued        2 g 200 mL/hr over 30 Minutes Intravenous 60 min pre-op 11/27/19 1730 11/28/19 0857   11/28/19 0000  ceFAZolin (ANCEF) IVPB 2g/100 mL premix  Status:  Discontinued        2 g 200 mL/hr over 30 Minutes Intravenous 60 min pre-op 11/27/19 1730 11/27/19 1733   11/25/19 1530  Ampicillin-Sulbactam (UNASYN) 3 g in sodium chloride 0.9 % 100 mL IVPB        3 g 200 mL/hr over 30 Minutes Intravenous Every 8 hours 11/25/19 1432 12/02/19 2359      PRN meds: acetaminophen **OR** acetaminophen (TYLENOL) oral liquid 160 mg/5 mL **OR** acetaminophen, albuterol, hydrOXYzine, LORazepam, senna-docusate   Objective: Vitals:   12/08/19 0830 12/08/19 0900  BP:  129/73  Pulse: 96 92  Resp: (!) 24 (!) 22  Temp:    SpO2:  97%    Intake/Output Summary (Last 24 hours) at 12/08/2019 1129 Last data filed at 12/08/2019 0900 Gross per 24 hour  Intake 1658.27 ml  Output 450 ml  Net 1208.27 ml   Filed Weights   12/06/19 0330 12/07/19 0500 12/08/19 0500  Weight: 44.8 kg 46.4 kg 45.2 kg   Weight change: -1.2 kg Body mass index is 16.58 kg/m.   Physical Exam: General exam: Appears calm and comfortable.  On restraints in room. Skin: No rashes, lesions or ulcers.  HEENT: Atraumatic, normocephalic, supple neck, no obvious bleeding.  Core track in place Lungs: Significantly transmitted throat sound to both lung fields.  Bilateral lung bases also have mild crackles CVS: Regular rate and rhythm, no murmur GI/Abd soft, nontender, nondistended, bowel sound present CNS: Alert, awake, but not oriented. Psychiatry: Depressed look. Extremities: Left upper extremities significantly swollen.  Data Review: I have personally reviewed the laboratory data and studies available.  Recent Labs  Lab 12/01/19 2315 12/04/19 0453 12/06/19 0019 12/07/19 0038 12/08/19 0045  WBC 19.7* 15.5* 20.5* 12.3* 18.4*  NEUTROABS  --   --   --   10.7*  --   HGB 8.2* 8.9* 8.4* 6.6* 11.3*  HCT 25.9* 27.8* 26.0* 21.0* 34.2*  MCV 85.5 86.9 87.5 89.0 83.8  PLT 492* 558* 526* 511* 503*   Recent Labs  Lab 12/01/19 2315 12/01/19 2315 12/03/19 0223 12/04/19 0453 12/05/19 0445 12/06/19 0019 12/07/19 0038  NA 134*   < > 132* 133* 133* 130* 135  K 4.7   < > 4.7 4.2 4.9 4.8 3.9  CL 97*   < > 96* 96* 94* 95* 105  CO2 29   < > 26 26 30 25 23   GLUCOSE 207*   < > 92 114* 133* 165*  107*  BUN 16   < > 14 17 14 16 13   CREATININE 0.66   < > 0.52* 0.65 0.78 0.66 0.48*  CALCIUM 8.2*   < > 8.1* 8.5* 8.7* 8.3* 7.0*  MG 2.0  --  2.2 2.1 2.1 2.0  --   PHOS 2.5  --  3.3 3.5 3.9 3.8  --    < > = values in this interval not displayed.    F/u labs ordered.  Signed, Terrilee Croak, MD [facility] 12/08/2019

## 2019-12-08 NOTE — Progress Notes (Signed)
ANTICOAGULATION CONSULT NOTE   Pharmacy Consult:  Heparin Indication:  S/p carotid stenting  Patient Measurements: Height: 5\' 5"  (165.1 cm) Weight: 45.2 kg (99 lb 10.4 oz) IBW/kg (Calculated) : 61.5 Heparin Dosing Weight: 44 kg  Vital Signs: Temp: 97.3 F (36.3 C) (11/06 0800) Temp Source: Axillary (11/06 0800) BP: 129/73 (11/06 0900) Pulse Rate: 92 (11/06 0900)  Labs: Recent Labs    12/06/19 0019 12/06/19 0019 12/06/19 2029 12/07/19 0038 12/08/19 0045  HGB 8.4*   < >  --  6.6* 11.3*  HCT 26.0*  --   --  21.0* 34.2*  PLT 526*  --   --  511* 503*  HEPARINUNFRC  --   --  <0.10*  --  <0.10*  CREATININE 0.66  --   --  0.48*  --    < > = values in this interval not displayed.    Estimated Creatinine Clearance: 56.5 mL/min (A) (by C-G formula based on SCr of 0.48 mg/dL (L)).  Assessment: 38 YOM with CVA in setting of carotid artery stenosis s/p stenting by IR on 11/4 and now found to have an acute LUE DVT. Given acute drop in Hgb 11/5 requiring PRBC - starting Heparin for anticoagulation for now.   Heparin level this morning remains SUBherapeutic (HL <0.1, goal of 0.3-0.5). The patient is noted to be a hardstick so labs were delayed this AM.   The patient is noted to have hematuria - so the drip was held by Dr. Pietro Cassis, the plan is to continue to hold for now.   Goal of Therapy:  Heparin level 0.3-0.5 units/ml Monitor platelets by anticoagulation protocol: Yes   Plan:  - D/c Heparin drip and labs - Will follow along with MD on plans to resume anticoagulation  Thank you for allowing pharmacy to be a part of this patient's care.  Alycia Rossetti, PharmD, BCPS Clinical Pharmacist Clinical phone for 12/08/2019: [phone] 12/08/2019 11:17 AM   **Pharmacist phone directory can now be found on [email or web address] ([email or web address]).  Listed under [place].

## 2019-12-08 NOTE — Progress Notes (Signed)
While repositioning patient noted that patient's urine had a red/bloody appearance, no obvious bleeding around catheter. Urine was yellow/clear on last assessment. Heparin gtt stopped and MD notified.  Candy Sledge, RN

## 2019-12-08 NOTE — Progress Notes (Signed)
Education provided on incentive spirometry, offered to patient 3 times over course of shift. Patient refused incentive spirometry.  Candy Sledge, RN

## 2019-12-08 NOTE — Progress Notes (Signed)
ANTICOAGULATION CONSULT NOTE   Pharmacy Consult:  Heparin Indication:  S/p carotid stenting  Allergies  Allergen Reactions  . Chantix [Varenicline] Other (See Comments)    Weird dreams    Patient Measurements: Height: 5\' 5"  (165.1 cm) Weight: 46.4 kg (102 lb 4.7 oz) IBW/kg (Calculated) : 61.5 Heparin Dosing Weight: 44 kg  Vital Signs: Temp: 98.1 F (36.7 C) (11/06 0000) Temp Source: Oral (11/06 0000) BP: 89/57 (11/05 2300) Pulse Rate: 92 (11/05 2300)  Labs: Recent Labs    12/05/19 0445 12/06/19 0019 12/06/19 0019 12/06/19 2029 12/07/19 0038 12/08/19 0045  HGB  --  8.4*   < >  --  6.6* 11.3*  HCT  --  26.0*  --   --  21.0* 34.2*  PLT  --  526*  --   --  511* 503*  HEPARINUNFRC  --   --   --  <0.10*  --  <0.10*  CREATININE 0.78 0.66  --   --  0.48*  --    < > = values in this interval not displayed.    Estimated Creatinine Clearance: 58 mL/min (A) (by C-G formula based on SCr of 0.48 mg/dL (L)).  Assessment: 57 YOM with CVA in setting of carotid artery stenosis.  Now s/p stenting and patient to be anticoagulated with heparin until tomorrow AM.  Heparin started prior to transfer to [facility].  Baseline labs reviewed.  Heparin level was undetectable, on 550 units/hr. Hgb 6.6, plt 511 - currently undergoing transfusion. No s/sx of bleeding. Found to have acute DVT involving L IJ, subclavian, axillary vein and L brachial vein. Given low Hgb and need for transfusion, discussed with team and will hold on DOAC at this time.   11/6 AM update:  Heparin level low Hgb better  Goal of Therapy:  Heparin level 0.3-0.5 units/ml Monitor platelets by anticoagulation protocol: Yes   Plan:  Inc heparin to 850 units/hr Will order heparin level in 6-8 hr Monitor daily HL, CBC, and for s/sx of bleeding   Narda Bonds, PharmD, BCPS Clinical Pharmacist Phone: [phone]

## 2019-12-08 NOTE — Progress Notes (Signed)
Entered patient's room this morning to introduce myself and assess patient, patient confused and behaving aggressively. Attempted to de-escalate situation by asking patient what was upsetting him and if he was in pain. Patient proceeded to raise his fist in my face multiple times before swinging on another nurse. Mitts placed, will notify MD for potential restraints.  Candy Sledge, RN

## 2019-12-08 NOTE — Progress Notes (Signed)
STROKE TEAM PROGRESS NOTE   INTERVAL HISTORY  Resp tech at bedside, combative had to place restraints. Awake and alert, moaning. Not cooperative. Anemia improved. Started on heparin IV for DVT LUE.    Vitals:   12/08/19 0300 12/08/19 0305 12/08/19 0400 12/08/19 0500  BP: (!) 129/95 (!) 129/95 96/86   Pulse: (!) 109 (!) 108 66   Resp: (!) 22 (!) 29 (!) 27   Temp:   98.6 F (37 C)   TempSrc:   Oral   SpO2: (!) 89%  (!) 43%   Weight:    45.2 kg  Height:       CBC:  Recent Labs  Lab 12/07/19 0038 12/08/19 0045  WBC 12.3* 18.4*  NEUTROABS 10.7*  --   HGB 6.6* 11.3*  HCT 21.0* 34.2*  MCV 89.0 83.8  PLT 511* 956*   Basic Metabolic Panel:  Recent Labs  Lab 12/05/19 0445 12/05/19 0445 12/06/19 0019 12/07/19 0038  NA 133*   < > 130* 135  K 4.9   < > 4.8 3.9  CL 94*   < > 95* 105  CO2 30   < > 25 23  GLUCOSE 133*   < > 165* 107*  BUN 14   < > 16 13  CREATININE 0.78   < > 0.66 0.48*  CALCIUM 8.7*   < > 8.3* 7.0*  MG 2.1  --  2.0  --   PHOS 3.9  --  3.8  --    < > = values in this interval not displayed.    IMAGING past24h CT ABDOMEN PELVIS WO CONTRAST  Result Date: 12/07/2019 CLINICAL DATA:  69 year old male status post bilateral femoral approach carotid stenting 12/06/2019 with decreasing hemoglobin overnight. Assess for retroperitoneal hemorrhage. EXAM: CT ABDOMEN AND PELVIS WITHOUT CONTRAST TECHNIQUE: Multidetector CT imaging of the abdomen and pelvis was performed following the standard protocol without IV contrast. COMPARISON:  CT abdomen/pelvis 10/28/2019; PET-CT 03/30/2019 FINDINGS: Lower chest: Chronic elevation of the left hemidiaphragm patchy ground-glass attenuation airspace opacities in the dependent portions of both the right and left lower lobe distributed in a peribronchovascular distribution. Diffuse mild bronchial wall thickening. Trace left pleural effusion. The visualized cardiac structures are within normal limits for size. Hepatobiliary: No focal liver  abnormality is seen. No gallstones, gallbladder wall thickening, or biliary dilatation. Pancreas: Unremarkable. No pancreatic ductal dilatation or surrounding inflammatory changes. Spleen: Normal in size without focal abnormality. Adrenals/Urinary Tract: Limited evaluation in the absence of intravenous contrast. No hydronephrosis or nephrolithiasis. Normal adrenal glands. A Foley catheter is present within the bladder. Stomach/Bowel: Stomach is within normal limits. Appendix appears normal. No evidence of bowel wall thickening, distention, or inflammatory changes. Gastric tube present with the tip in the stomach. Vascular/Lymphatic: Limited evaluation in the absence of intravenous contrast. Atherosclerotic calcifications present along the abdominal aorta and branch vessels. Progressive left para-aortic lymphadenopathy. Index lymph node measures up to 1.1 cm in short axis (image 43 series 3) compared to 0.7 cm previously. Reproductive: Massive prostatomegaly. The prostate gland measures 6.5 x 6.6 x 6.7 cm (volume = 150 cm^3). Other: No evidence of groin or retroperitoneal hematoma. Musculoskeletal: No acute fracture or aggressive appearing lytic or blastic osseous lesion. IMPRESSION: 1. No evidence of groin or retroperitoneal hemorrhage. 2. Ground-glass attenuation airspace opacities and bronchial wall thickening in the dependent portions of both lower lungs. Imaging findings are suggestive of aspiration. 3. Progressive left para-aortic lymphadenopathy compared to prior imaging from 10/28/2019 and 03/30/2019. Given known history of non-small  cell lung cancer, these findings are concerning for metastatic lymphadenopathy. Additional considerations include lymphoma. Consider further evaluation with PET-CT. 4. Marked prostatomegaly. 5. Additional ancillary findings as above. Electronically Signed   By: Jacqulynn Cadet M.D.   On: 12/07/2019 13:44   DG CHEST PORT 1 VIEW  Result Date: 12/07/2019 CLINICAL DATA:   Dyspnea. EXAM: PORTABLE CHEST 1 VIEW COMPARISON:  December 06, 2019. FINDINGS: The heart size and mediastinal contours are within normal limits. Feeding tube is seen entering stomach. No pneumothorax or pleural effusion is noted. Left lung is clear. Ill-defined nodular opacity seen in right midlung is again noted and may represent focal inflammation. The visualized skeletal structures are unremarkable. IMPRESSION: Ill-defined nodular opacity seen in right midlung is again noted and may represent focal inflammation. Electronically Signed   By: Marijo Conception M.D.   On: 12/07/2019 08:40   VAS [place] UPPER EXTREMITY VENOUS DUPLEX  Result Date: 12/07/2019 UPPER VENOUS STUDY  Indications: Swelling, Edema, and History of LUE DVT on 11/22/19 Other Indications: Status post right common carotid artery stenting on 12/06/2019. Risk Factors: Cancer Lung COPD, pneumonia. Comparison Study: 11/22/19 - LUE venous. Performing Technologist: Oda Cogan RDMS, RVT  Examination Guidelines: A complete evaluation includes B-mode imaging, spectral Doppler, color Doppler, and power Doppler as needed of all accessible portions of each vessel. Bilateral testing is considered an integral part of a complete examination. Limited examinations for reoccurring indications may be performed as noted.  Right Findings: +----------+------------+---------+-----------+----------+-------+ RIGHT     CompressiblePhasicitySpontaneousPropertiesSummary +----------+------------+---------+-----------+----------+-------+ Subclavian               Yes       Yes                      +----------+------------+---------+-----------+----------+-------+  Left Findings: +----------+------------+---------+-----------+----------+-------+ LEFT      CompressiblePhasicitySpontaneousPropertiesSummary +----------+------------+---------+-----------+----------+-------+ IJV           None       No        No                Acute   +----------+------------+---------+-----------+----------+-------+ Subclavian    None       No        No                Acute  +----------+------------+---------+-----------+----------+-------+ Axillary      None       No        No                Acute  +----------+------------+---------+-----------+----------+-------+ Brachial    Partial                                  Acute  +----------+------------+---------+-----------+----------+-------+ Radial        Full                                          +----------+------------+---------+-----------+----------+-------+ Ulnar         Full                                          +----------+------------+---------+-----------+----------+-------+ Cephalic      Full                                          +----------+------------+---------+-----------+----------+-------+  Basilic       None                                   Acute  +----------+------------+---------+-----------+----------+-------+  Summary:  Right: No evidence of thrombosis in the subclavian.  Left: Findings consistent with acute deep vein thrombosis involving the left internal jugular vein, left subclavian vein, left axillary vein and left brachial veins. Findings consistent with acute superficial vein thrombosis involving the left basilic vein. Findings for deep vein thrombosis appear unchanged from previous study.  *See table(s) above for measurements and observations.  Diagnosing physician: Servando Snare MD Electronically signed by Servando Snare MD on 12/07/2019 at 4:03:30 PM.    Final      PHYSICAL EXAM     Temp:  [97.5 F (36.4 C)-98.6 F (37 C)] 98.6 F (37 C) (11/06 0400) Pulse Rate:  [73-419] 37 (11/06 0400) Resp:  [15-29] 27 (11/06 0400) BP: (84-129)/(51-95) 96/86 (11/06 0400) SpO2:  [43 %-100 %] 43 % (11/06 0400) Arterial Line BP: (98-126)/(51-69) 126/69 (11/05 1030) Weight:  [45.2 kg] 45.2 kg (11/06 0500)  General -frail cachectic  malnourished looking elderly male in no apparent distress. He has right below-knee amputation.  Ophthalmologic - fundi not visualized due to noncooperation.  Cardiovascular - Regular rhythm and rate.  Respiratory - b/l Rhonchi, secretion in throat with weak cough.  Neuro stable - awake alert, non verbal, in restsraints, not cooperate, tracks across the room, not cooperative to follow commands or answer questions. Mild left facial droop, not cooperative with tongue movements,  Moving all limbs.   Left upper extremity 3-/5 throughout. Significant weakness of left hand intrinsic hand muscles. Right upper extremity at least 4/5.  Bilateral lower extremity at least 4/5 with right BKA.  Sensation symmetrical.  Gait not tested.   ASSESSMENT/PLAN [name] is a 70 y.o. male with history of recurrent non small cell lung cancer (stage IIIa status post chemo and radiation), traumatic right knee BKA, recent recurrent pneumonia, left renal mass, known prior bilateral carotid artery stenosis, COPD w/ recent exacerbation d/c 10/19. He has had generalized weakness and poor sleep, chronic swallowing, wt loss with poor apptetite and his son and dtr recently moved in to help him. Plans for prostate bx for which he had stopped his aspirin. On 10/20 presented [facility] ED with difficulty w/ spatial awareness and vision guided movements, HA, L sided weakness. Transfered to [facility].  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 Additional scattered cortical + subcortical B watershed infarcts. Min petechial hemorrhage in L temporooccipital infarct. Old tiny L cerebellar infarct  CTA head & neck no LVO. 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 suspicion for L IJ thrombosis. B ICA siphon w/ mild stenosis. LLL  opacity.   MRA head  Unremarkable   Cerebral angio confirmed bilateral CCA high-grade stenosis  2D Echo EF 55 to 60%  LDL 154   HgbA1c 6.3  VTE prophylaxis - Lovenox 40 mg sq daily   off aspirin for planned prostate bx prior to admission, now on brilinta 30 bid and heparin IV.  Therapy recommendations:  SNF  Disposition:  pending   Palliative following   Recurrent PNA and Respiratory Distress / Agitation with concern for  aspiration COPD  Increased WOB 10/21 evening  Put on BIPAP w/ relief  Changed level to progressive care   Treated w/ ativan and haldol. Good relief w/ haldol  Constant rhonchi but not in distress  No dysarthria  On breathing treatment bid   Carotid Stenosis   CTA neck 02/2017 Long segment of severe 80% right and severe 90% left proximal  common carotid artery stenosis.  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  Cerebral angio confirmed bilateral CCA high-grade stenosis  S/p R CCA stenting 12/06/19   Now on brilinta 30 bid and heparin IV for DVT  Acute Blood Loss Anemia  HGB 11.3->10.1->8.7.Marland KitchenMarland Kitchen8.4->6.6->PRBC->11.3  CT abd & pelvis no retroperitoneal bleeding   Transfuse 2U PRBC   CBC monitoring  L parietooccipital lesion  MRI w/ severely degraded, enhancement L temporooccipital lobe, repeat recommended  Likely due to infarction  MRI with contrast nondiagnostic - motion due to SOB lying flat  LUE DVT  No prior line placement in neck per family, history of for Port-A-Cath on right chest per patient  CTA neck in 2019 also suspicious for left IJ thrombosis, however not for certain - discussed with Dr. 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  11/5 - LUE swollen - LUE venous doppler unchanged left internal jugular vein, left subclavian vein, left axillary vein and left brachial veins DVT but also left basilic vein SVT  Given acute change of LUE swelling, decided to treat DVT with heparin IV 12/07/19  History of hypertension Hypotension  Recurrent low BP   Bolus IVF followed by albumin w/ IVF at 10/22 . No apparent CHF history . Long-term BP goal 130-150 given carotid stenosis.   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   recurrence on the left side presented as a stage IIIa non-small cell lung cancer, squamous cell carcinoma status post concurrent chemoradiation followed by consolidation chemotherapy. Completed in March 2017.  He also underwent SBRT to the right middle lobe lung nodule under the care of Dr. [name] completed on 07/24/2018.  He also underwent SBRT to left lower lobe lung nodule under the care of Dr. [name] completed on April 27, 2019.  Recent scan showed no concerning findings for disease progression  Followed with Dr. Julien Nordmann  Other Stroke Risk Factors  Advanced age  Cigarette smoker, quit 7 mos ago, advised to stop smoking  Other Active Problems  Recent COPD exacerbation, d/c 10/19. Nebs, pulmonary toilet for respiratory distress. On mucomyst  Recent recurrent PNA.   Marked enlargement of the Prostate w/ plans for bx    FTT. Cachexia, Body mass index is 16.58 kg/m.   Traumatic right BKA  Tachycardia   Leukocytosis 10.6 . . . . . . 20.5->12.3 (afebrile)->18.4 on prednisolone  TSH high, free T4  normal  Hyponatremia - Na - 130->135  Palliative care following pt.  Code status - Full code  .  Stroke team will sign off, can continue Brilinta at discharge and follow up with Dr. Estanislado Pandy. Discussed with Dr. Erlinda Hong and Dr. Malva Cogan.   Personally examined patient and images, and have participated in and made any corrections needed to history, physical, neuro exam,assessment and plan as stated above.  I have personally obtained the history, evaluated lab date, reviewed imaging studies and agree with radiology interpretations.    Sarina Ill, MD Stroke Neurology  I spent 35 minutes of face-to-face and non-face-to-face time with patient. This included prechart review, lab review, study review, order entry, electronic health record documentation, patient education on the different diagnostic and therapeutic options, counseling and coordination of care, risks and benefits of management, compliance, or risk factor reduction      To contact Stroke Continuity provider, please refer to [email or web address] After hours, contact General Neurology

## 2019-12-09 LAB — BASIC METABOLIC PANEL
Anion gap: 7 (ref 5–15)
BUN: 10 mg/dL (ref 8–23)
CO2: 27 mmol/L (ref 22–32)
Calcium: 8.3 mg/dL — ABNORMAL LOW (ref 8.9–10.3)
Chloride: 102 mmol/L (ref 98–111)
Creatinine, Ser: 0.55 mg/dL — ABNORMAL LOW (ref 0.61–1.24)
GFR, Estimated: >60 mL/min (ref >60)
Glucose, Bld: 109 mg/dL — ABNORMAL HIGH (ref 70–99)
Potassium: 4 mmol/L (ref 3.5–5.1)
Sodium: 136 mmol/L (ref 135–145)

## 2019-12-09 LAB — CBC WITH DIFFERENTIAL/PLATELET
Abs Immature Granulocytes: 0.06 10*3/uL (ref 0.00–0.07)
Basophils Absolute: 0 10*3/uL (ref 0.0–0.1)
Basophils Relative: 0 %
Eosinophils Absolute: 0.1 10*3/uL (ref 0.0–0.5)
Eosinophils Relative: 1 %
HCT: 32.6 % — ABNORMAL LOW (ref 39.0–52.0)
Hemoglobin: 10.7 g/dL — ABNORMAL LOW (ref 13.0–17.0)
Immature Granulocytes: 0 %
Lymphocytes Relative: 5 %
Lymphs Abs: 0.6 10*3/uL — ABNORMAL LOW (ref 0.7–4.0)
MCH: 28.1 pg (ref 26.0–34.0)
MCHC: 32.8 g/dL (ref 30.0–36.0)
MCV: 85.6 fL (ref 80.0–100.0)
Monocytes Absolute: 0.8 10*3/uL (ref 0.1–1.0)
Monocytes Relative: 6 %
Neutro Abs: 11.9 10*3/uL — ABNORMAL HIGH (ref 1.7–7.7)
Neutrophils Relative %: 88 %
Platelets: 491 10*3/uL — ABNORMAL HIGH (ref 150–400)
RBC: 3.81 MIL/uL — ABNORMAL LOW (ref 4.22–5.81)
RDW: 19.1 % — ABNORMAL HIGH (ref 11.5–15.5)
WBC: 13.5 10*3/uL — ABNORMAL HIGH (ref 4.0–10.5)
nRBC: 0 % (ref 0.0–0.2)

## 2019-12-09 LAB — GLUCOSE, CAPILLARY
Glucose-Capillary: 103 mg/dL — ABNORMAL HIGH (ref 70–99)
Glucose-Capillary: 139 mg/dL — ABNORMAL HIGH (ref 70–99)
Glucose-Capillary: 202 mg/dL — ABNORMAL HIGH (ref 70–99)
Glucose-Capillary: 151 mg/dL — ABNORMAL HIGH (ref 70–99)
Glucose-Capillary: 224 mg/dL — ABNORMAL HIGH (ref 70–99)

## 2019-12-09 MED ORDER — PREDNISOLONE 5 MG PO TABS
30.0000 mg | ORAL_TABLET | Freq: Every day | ORAL | Status: DC
  Administered 2019-12-10 – 2019-12-11 (×2): 30 mg via ORAL
  Filled 2019-12-09 (×3): qty 6

## 2019-12-09 NOTE — Progress Notes (Signed)
PROGRESS NOTE  Evan [name]  DOB: 06/07/1950  PCP: Patient, No Pcp Per WJX:[id number]  DOA: 11/21/2019  LOS: 18 days   Chief complaint: Confusion, weakness  Brief narrative: Evan [name] is a 69 y.o. 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 Patient presented to the ED on 10/20 with significant confusion, weakness for 24 hours.  He was found to have CVA with carotid stenosis. Carotid stenting was recommended but was delayed because of his poor respiratory status and poor nutritional status.  Core track feeding was started to augment nutrition while pending carotid stenting.  Respiratory status subsequently improved and he underwent right carotid stenting on 11/4.  Postprocedure, his hospital course was complicated with low blood pressure, drop in hemoglobin, altered mental status, poor cough reflex, persistent gurgling of secretions and aspiration pneumonia.   Gradually improving now.  Subjective: Patient was seen and examined this morning. Alert, awake, oriented x3.  He is more coherent today and is able to give me detailed answers.   Not in physical distress.  On 2 L oxygen by nasal cannula.  Not in restraints this morning. Blood pressure stable.   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   -Imaging showed acute ischemic infarct involving the left temporoccipital region, -LDL 154, A1c 6.3.  Echocardiogram EF 55 to 60%.  CTA head and neck did not show LVO but showed severe bilateral carotid artery stenosis therefore IR consulted for stent placement. -11/4, underwent right carotid artery stenting by neuro IR.  -Because  of coexisting DVT, patient was started on heparin drip and Brilinta.  However heparin drip had to be held on 11/6 because of hematuria.  Acute respiratory failure with hypoxia History of COPD Stage III lung cancer Chronic gurgling throat sound -During this hospitalization, patient had bilateral diffuse coarse breath sounds from aspiration pneumonia and upper airway secretion.  -He states that for several months, patient is having gurgling sound in his throat.  He reports that Dr. Julien Nordmann, his oncologist suspects it is secondary to scar tissue from cancer.  Patient does not feel any pain or distress related to that. -During this hospitalization, I think his gurgling throat sound has been interpreted multiple times as congestion.  On my evaluation, I have consistently noticed that after he coughs a little bit, sound disappears for a little while.  Patient has not required more than 2 to 3 L of oxygen throughout this stay. -He is currently on IV Unasyn and a tapering course of steroids. -Also continue bronchodilators, scopolamine.  Acute delirium Acute metabolic encephalopathy -Multifactorial: Prolonged hospitalization, stroke, hypoxia. -11/6 started on Seroquel 25 mg twice daily.  Mental status much better this morning.  Not on restraints.  Acute DVT of left upper extremity Chronic left IJ thrombosis -CT angio of neck from 10/20 showed chronic findings suspicious for left IJ thrombosis but at the time, patient did not have any swelling of the left upper extremity.  However, this morning, patient is noted to have significant swelling of left upper extremity. -11/5, patient was noted to have worsening left upper extremity swelling and hence ultrasound duplex was obtained which showed an acute DVT involving the left internal jugular vein, left subclavian vein, left jugular vein, left brachial veins  and left basilic vein. -Discussed with neuro and neuro IR.  Patient was initiated on heparin drip which  unfortunately had to be stopped the next day because of hematuria. -Instructed to keep left upper extremity elevated.  Acute anemia Hematuria -During this hospitalization, patient had hematuria related to traumatic Foley placement.  It resolved and patient is no longer having hematuria.  Foley catheter changed on 11/28/2019 -However on 11/5, his hemoglobin dropped down to the lowest of 6.6. He got 2 units of PRBC transfusion with subsequent improvement in hemoglobin. -11/5 CT abdomen pelvis today did not show any evidence of retroperitoneal bleed. -11/6, patient started to have hematuria again.  Heparin drip was stopped.  Currently on Brilinta only.  Urine color looks only light pink today.  Once hematuria improves, we may be able to add back aspirin 81 mg daily. Recent Labs    11/25/19 0450 11/26/19 1021 11/27/19 0431 11/28/19 0413 12/01/19 2315 12/04/19 0453 12/06/19 0019 12/07/19 0038 12/08/19 0045 12/09/19 0322  HGB 11.7* 11.3* 10.1* 8.7* 8.2* 8.9* 8.4* 6.6* 11.3* 10.7*   Postprocedural hypotension -Resolved with IV fluids.  Currently blood pressure stable.  Severe protein calorie malnutrition -Temporary feeding tube placed 11/30/2019 for nutritional augmentation.  -He was given feeding through core track to augment nutrition.  Core track was pulled out on 11/5.  Currently on oral feeding.  Mobility: PT evaluation appreciated Code Status:   Code Status: Full Code  Nutritional status: Body mass index is 16.58 kg/m. Nutrition Problem: Severe Malnutrition Etiology: chronic illness, cancer and cancer related treatments Signs/Symptoms: severe muscle depletion, severe fat depletion Percent weight loss: 19.8 % (x7 months) Diet Order            Diet regular Room service appropriate? Yes with Assist; Fluid consistency: Thin  Diet effective now                 DVT prophylaxis: Eliquis  Antimicrobials:  Continue IV Unasyn Fluid: None Consultants: Neuroradiology Family  Communication:  Updated daughter multiple times during his course of auscultation.  Last update 11/6  Status is: Inpatient  Remains inpatient appropriate because, pending stent placement today   Dispo: The patient is from: Home              Anticipated d/c is to: SNF recommended by PT              Anticipated d/c date is: 2 to 3 days              Patient currently is not medically stable to d/c.   Infusions:  . ampicillin-sulbactam (UNASYN) IV 3 g (12/09/19 0519)  . feeding supplement (OSMOLITE 1.2 CAL) 60 mL/hr at 12/06/19 0600    Scheduled Meds: .  stroke: mapping our early stages of recovery book   Does not apply Once  . arformoterol  15 mcg Nebulization BID  . atorvastatin  80 mg Oral Daily  . budesonide (PULMICORT) nebulizer solution  0.25 mg Nebulization BID  . chlorhexidine  15 mL Mouth/Throat Once  . Chlorhexidine Gluconate Cloth  6 each Topical Daily  . feeding supplement  237 mL Oral TID BM  . feeding supplement (PROSource TF)  45 mL Per Tube Daily  . guaiFENesin  600 mg Oral BID  . insulin aspart  0-6 Units Subcutaneous Q4H  . multivitamin with minerals  1 tablet Oral Daily  . nicotine  14 mg Transdermal Daily  . pantoprazole  40 mg Oral BID  . [START ON 12/10/2019] prednisoLONE  30 mg Oral Daily  . QUEtiapine  25 mg Oral BID  . revefenacin  175 mcg Nebulization Daily  . scopolamine  1 patch Transdermal Q72H  . tamsulosin  0.4 mg Oral QHS  . ticagrelor  30 mg Oral BID   Or  . ticagrelor  30 mg Per Tube BID    Antimicrobials: Anti-infectives (From admission, onward)   Start     Dose/Rate Route Frequency Ordered Stop   12/07/19 1500  Ampicillin-Sulbactam (UNASYN) 3 g in sodium chloride 0.9 % 100 mL IVPB        3 g 200 mL/hr over 30 Minutes Intravenous Every 8 hours 12/07/19 1404     11/28/19 0000  ceFAZolin (ANCEF) IVPB 2g/100 mL premix  Status:  Discontinued        2 g 200 mL/hr over 30 Minutes Intravenous 60 min pre-op 11/27/19 1730 11/28/19 0857    11/28/19 0000  ceFAZolin (ANCEF) IVPB 2g/100 mL premix  Status:  Discontinued        2 g 200 mL/hr over 30 Minutes Intravenous 60 min pre-op 11/27/19 1730 11/27/19 1733   11/25/19 1530  Ampicillin-Sulbactam (UNASYN) 3 g in sodium chloride 0.9 % 100 mL IVPB        3 g 200 mL/hr over 30 Minutes Intravenous Every 8 hours 11/25/19 1432 12/02/19 2359      PRN meds: acetaminophen **OR** acetaminophen (TYLENOL) oral liquid 160 mg/5 mL **OR** acetaminophen, albuterol, hydrOXYzine, LORazepam, senna-docusate   Objective: Vitals:   12/09/19 0951 12/09/19 1010  BP:    Pulse: (!) 105 90  Resp: (!) 28 (!) 21  Temp:    SpO2: 99% 92%    Intake/Output Summary (Last 24 hours) at 12/09/2019 1314 Last data filed at 12/09/2019 0555 Gross per 24 hour  Intake 100 ml  Output 500 ml  Net -400 ml   Filed Weights   12/06/19 0330 12/07/19 0500 12/08/19 0500  Weight: 44.8 kg 46.4 kg 45.2 kg   Weight change:  Body mass index is 16.58 kg/m.   Physical Exam: General exam: Appears calm and comfortable.  Not in physical distress. Skin: No rashes, lesions or ulcers.  HEENT: Atraumatic, normocephalic, supple neck, no obvious bleeding.  Core track in place Lungs: Gurgling throat sound, transmitted to chest.  Not in respiratory distress  CVS: Regular rate and rhythm, no murmur GI/Abd soft, nontender, nondistended, bowel sound present CNS: Alert, awake, but not oriented. Psychiatry: Depressed look. Extremities: Left upper extremities significantly swollen.  Data Review: I have personally reviewed the laboratory data and studies available.  Recent Labs  Lab 12/04/19 0453 12/06/19 0019 12/07/19 0038 12/08/19 0045 12/09/19 0322  WBC 15.5* 20.5* 12.3* 18.4* 13.5*  NEUTROABS  --   --  10.7*  --  11.9*  HGB 8.9* 8.4* 6.6* 11.3* 10.7*  HCT 27.8* 26.0* 21.0* 34.2* 32.6*  MCV 86.9 87.5 89.0 83.8 85.6  PLT 558* 526* 511* 503* 491*   Recent Labs  Lab 12/03/19 0223 12/03/19 0223 12/04/19 0453  12/05/19 0445 12/06/19 0019 12/07/19 0038 12/09/19 0322  NA 132*   < > 133* 133* 130* 135 136  K 4.7   < > 4.2 4.9 4.8 3.9 4.0  CL 96*   < > 96* 94* 95* 105 102  CO2 26   < > 26 30 25 23 27   GLUCOSE 92   < > 114* 133* 165* 107* 109*  BUN 14   < > 17 14 16 13 10   CREATININE 0.52*   < >  0.65 0.78 0.66 0.48* 0.55*  CALCIUM 8.1*   < > 8.5* 8.7* 8.3* 7.0* 8.3*  MG 2.2  --  2.1 2.1 2.0  --   --   PHOS 3.3  --  3.5 3.9 3.8  --   --    < > = values in this interval not displayed.    F/u labs ordered.  Signed, Terrilee Croak, MD [facility] 12/09/2019

## 2019-12-10 LAB — CBC WITH DIFFERENTIAL/PLATELET
Abs Immature Granulocytes: 0.06 10*3/uL (ref 0.00–0.07)
Basophils Absolute: 0 10*3/uL (ref 0.0–0.1)
Basophils Relative: 0 %
Eosinophils Absolute: 0.1 10*3/uL (ref 0.0–0.5)
Eosinophils Relative: 1 %
HCT: 34.1 % — ABNORMAL LOW (ref 39.0–52.0)
Hemoglobin: 10.9 g/dL — ABNORMAL LOW (ref 13.0–17.0)
Immature Granulocytes: 1 %
Lymphocytes Relative: 5 %
Lymphs Abs: 0.7 10*3/uL (ref 0.7–4.0)
MCH: 27.6 pg (ref 26.0–34.0)
MCHC: 32 g/dL (ref 30.0–36.0)
MCV: 86.3 fL (ref 80.0–100.0)
Monocytes Absolute: 0.9 10*3/uL (ref 0.1–1.0)
Monocytes Relative: 7 %
Neutro Abs: 11.4 10*3/uL — ABNORMAL HIGH (ref 1.7–7.7)
Neutrophils Relative %: 86 %
Platelets: 467 10*3/uL — ABNORMAL HIGH (ref 150–400)
RBC: 3.95 MIL/uL — ABNORMAL LOW (ref 4.22–5.81)
RDW: 18.6 % — ABNORMAL HIGH (ref 11.5–15.5)
WBC: 13.1 10*3/uL — ABNORMAL HIGH (ref 4.0–10.5)
nRBC: 0 % (ref 0.0–0.2)

## 2019-12-10 LAB — GLUCOSE, CAPILLARY
Glucose-Capillary: 107 mg/dL — ABNORMAL HIGH (ref 70–99)
Glucose-Capillary: 121 mg/dL — ABNORMAL HIGH (ref 70–99)
Glucose-Capillary: 137 mg/dL — ABNORMAL HIGH (ref 70–99)
Glucose-Capillary: 146 mg/dL — ABNORMAL HIGH (ref 70–99)
Glucose-Capillary: 152 mg/dL — ABNORMAL HIGH (ref 70–99)
Glucose-Capillary: 155 mg/dL — ABNORMAL HIGH (ref 70–99)
Glucose-Capillary: 161 mg/dL — ABNORMAL HIGH (ref 70–99)

## 2019-12-10 LAB — BASIC METABOLIC PANEL
Anion gap: 9 (ref 5–15)
BUN: 9 mg/dL (ref 8–23)
CO2: 27 mmol/L (ref 22–32)
Calcium: 8.2 mg/dL — ABNORMAL LOW (ref 8.9–10.3)
Chloride: 104 mmol/L (ref 98–111)
Creatinine, Ser: 0.64 mg/dL (ref 0.61–1.24)
GFR, Estimated: >60 mL/min (ref >60)
Glucose, Bld: 114 mg/dL — ABNORMAL HIGH (ref 70–99)
Potassium: 3.8 mmol/L (ref 3.5–5.1)
Sodium: 140 mmol/L (ref 135–145)

## 2019-12-10 LAB — RESPIRATORY PANEL BY RT PCR (FLU A&B, COVID)
Influenza A by PCR: NEGATIVE
Influenza B by PCR: NEGATIVE
SARS Coronavirus 2 by RT PCR: NEGATIVE

## 2019-12-10 MED ORDER — AMOXICILLIN-POT CLAVULANATE 875-125 MG PO TABS
1.0000 | ORAL_TABLET | Freq: Two times a day (BID) | ORAL | Status: DC
  Administered 2019-12-10 – 2019-12-12 (×5): 1 via ORAL
  Filled 2019-12-10 (×6): qty 1

## 2019-12-10 NOTE — Progress Notes (Signed)
PROGRESS NOTE  Evan [name]  DOB: 1950/02/09  PCP: Patient, No Pcp Per [id number]  DOA: 11/21/2019  LOS: 19 days   Chief complaint: Confusion, weakness  Brief narrative: Evan [name] is a 69 y.o. 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 Patient presented to the ED on 10/20 with significant confusion, weakness for 24 hours.  He was found to have CVA with carotid stenosis. Carotid stenting was recommended but was delayed because of his poor respiratory status and poor nutritional status.  Core track feeding was started to augment nutrition while pending carotid stenting.  Respiratory status subsequently improved and he underwent right carotid stenting on 11/4.  Postprocedure, his hospital course was complicated with low blood pressure, drop in hemoglobin, altered mental status, poor cough reflex, persistent gurgling of secretions and aspiration pneumonia.   Gradually improving now.  Subjective: Patient was seen and examined this morning. Sitting up in chair. Alert, awake, oriented x3.  On low-flow oxygen by nasal cannula.  Continues to have gurgling throat sound as before.    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   -Imaging showed acute ischemic infarct involving the left temporoccipital region, -LDL 154, A1c 6.3.  Echocardiogram EF 55 to 60%.  CTA head and neck did not show LVO but showed severe bilateral carotid artery stenosis therefore IR consulted for stent placement. -11/4, underwent right carotid artery stenting by neuro IR.  -Because of coexisting DVT, patient was started on heparin drip and Brilinta.   However heparin drip had to be held on 11/6 because of hematuria.  Acute respiratory failure with hypoxia History of COPD Stage III lung cancer Chronic gurgling throat sound -During this hospitalization, patient had bilateral diffuse coarse breath sounds from aspiration pneumonia and upper airway secretion.  -He states that for several months, patient is having gurgling sound in his throat.  He reports that Dr. Julien Nordmann, his oncologist suspects it is secondary to scar tissue from cancer.  Patient does not feel any pain or distress related to that. -During this hospitalization, I think his gurgling throat sound has been interpreted multiple times as congestion.  On my evaluation, I have consistently noticed that after he coughs a little bit, sound disappears for a little while.  Patient has not required more than 2 to 3 L of oxygen throughout this stay. -He is currently on IV Unasyn. Switch to oral Augmentin today. -Continue a tapering course of steroids. -Also continue bronchodilators, scopolamine.  Acute delirium Acute metabolic encephalopathy -Multifactorial: Prolonged hospitalization, stroke, hypoxia. -Mental status is intact now on Seroquel 25 mg twice daily.    Acute DVT of left upper extremity Chronic left IJ thrombosis -CT angio of neck from 10/20 showed chronic findings suspicious for left IJ thrombosis but at the time, patient did not have any swelling of the left upper extremity.  However, this morning, patient is noted to have significant swelling of left upper extremity. -11/5, patient was noted to have worsening left upper extremity swelling and hence ultrasound duplex was obtained which showed an acute DVT involving the left internal jugular vein, left subclavian vein, left jugular vein, left brachial veins and left basilic vein. -Discussed with neuro and neuro IR.  Patient was initiated on heparin drip  which unfortunately had to be stopped the next day because of  hematuria. -Instructed to keep left upper extremity elevated.  Acute anemia Hematuria -During this hospitalization, patient had hematuria related to traumatic Foley placement.  It resolved and patient is no longer having hematuria.  Foley catheter changed on 11/28/2019 -However on 11/5, his hemoglobin dropped down to the lowest of 6.6. He got 2 units of PRBC transfusion with subsequent improvement in hemoglobin. -11/5 CT abdomen pelvis today did not show any evidence of retroperitoneal bleed. -11/6, patient started to have hematuria again.  Heparin drip was stopped.  Brilinta was maintained.  Urine color seems to be clearing up but remains pinkish. Once hematuria improves, we may be able to aspirin 81 mg daily today.   Recent Labs    11/26/19 1021 11/27/19 0431 11/28/19 0413 12/01/19 2315 12/04/19 0453 12/06/19 0019 12/07/19 0038 12/08/19 0045 12/09/19 0322 12/10/19 0649  HGB 11.3* 10.1* 8.7* 8.2* 8.9* 8.4* 6.6* 11.3* 10.7* 10.9*   Postprocedural hypotension -Resolved with IV fluids.  Currently blood pressure stable.  Severe protein calorie malnutrition -10/29 - 11/5 -patient was fed through core track because of poor nutritional status prior to surgery. -Currently on oral feeding.  Mobility: PT evaluation appreciated Code Status:   Code Status: Full Code  Nutritional status: Body mass index is 16.55 kg/m. Nutrition Problem: Severe Malnutrition Etiology: chronic illness, cancer and cancer related treatments Signs/Symptoms: severe muscle depletion, severe fat depletion Percent weight loss: 19.8 % (x7 months) Diet Order            Diet regular Room service appropriate? Yes with Assist; Fluid consistency: Thin  Diet effective now                 DVT prophylaxis: Eliquis  Antimicrobials:  Continue IV Unasyn Fluid: None Consultants: Neuroradiology Family Communication:  Updated daughter multiple times during his course of auscultation.  Last update 11/6  Status is:  Inpatient Remains inpatient because -mild hematuria persists  Dispo: The patient is from: Home              Anticipated d/c is to: SNF recommended by PT              Anticipated d/c date is: 1 to 2 days              Patient currently is not medically stable to d/c.   Infusions:  . feeding supplement (OSMOLITE 1.2 CAL) 60 mL/hr at 12/06/19 0600    Scheduled Meds: .  stroke: mapping our early stages of recovery book   Does not apply Once  . amoxicillin-clavulanate  1 tablet Oral Q12H  . arformoterol  15 mcg Nebulization BID  . atorvastatin  80 mg Oral Daily  . budesonide (PULMICORT) nebulizer solution  0.25 mg Nebulization BID  . chlorhexidine  15 mL Mouth/Throat Once  . Chlorhexidine Gluconate Cloth  6 each Topical Daily  . feeding supplement  237 mL Oral TID BM  . feeding supplement (PROSource TF)  45 mL Per Tube Daily  . guaiFENesin  600 mg Oral BID  . insulin aspart  0-6 Units Subcutaneous Q4H  . multivitamin with minerals  1 tablet Oral Daily  . nicotine  14 mg Transdermal Daily  . pantoprazole  40 mg Oral BID  . prednisoLONE  30 mg Oral Daily  . QUEtiapine  25 mg Oral BID  . revefenacin  175 mcg Nebulization Daily  . scopolamine  1 patch Transdermal Q72H  . tamsulosin  0.4 mg  Oral QHS  . ticagrelor  30 mg Oral BID   Or  . ticagrelor  30 mg Per Tube BID    Antimicrobials: Anti-infectives (From admission, onward)   Start     Dose/Rate Route Frequency Ordered Stop   12/10/19 1415  amoxicillin-clavulanate (AUGMENTIN) 875-125 MG per tablet 1 tablet        1 tablet Oral Every 12 hours 12/10/19 1319     12/07/19 1500  Ampicillin-Sulbactam (UNASYN) 3 g in sodium chloride 0.9 % 100 mL IVPB  Status:  Discontinued        3 g 200 mL/hr over 30 Minutes Intravenous Every 8 hours 12/07/19 1404 12/10/19 1319   11/28/19 0000  ceFAZolin (ANCEF) IVPB 2g/100 mL premix  Status:  Discontinued        2 g 200 mL/hr over 30 Minutes Intravenous 60 min pre-op 11/27/19 1730 11/28/19 0857    11/28/19 0000  ceFAZolin (ANCEF) IVPB 2g/100 mL premix  Status:  Discontinued        2 g 200 mL/hr over 30 Minutes Intravenous 60 min pre-op 11/27/19 1730 11/27/19 1733   11/25/19 1530  Ampicillin-Sulbactam (UNASYN) 3 g in sodium chloride 0.9 % 100 mL IVPB        3 g 200 mL/hr over 30 Minutes Intravenous Every 8 hours 11/25/19 1432 12/02/19 2359      PRN meds: acetaminophen **OR** acetaminophen (TYLENOL) oral liquid 160 mg/5 mL **OR** acetaminophen, albuterol, hydrOXYzine, LORazepam, senna-docusate   Objective: Vitals:   12/10/19 0829 12/10/19 0830  BP:    Pulse:    Resp:    Temp:    SpO2: 100% 100%    Intake/Output Summary (Last 24 hours) at 12/10/2019 1319 Last data filed at 12/10/2019 0900 Gross per 24 hour  Intake 985.26 ml  Output 980 ml  Net 5.26 ml   Filed Weights   12/07/19 0500 12/08/19 0500 12/09/19 1330  Weight: 46.4 kg 45.2 kg 45.1 kg   Weight change:  Body mass index is 16.55 kg/m.   Physical Exam: General exam: Appears calm and comfortable.  Not in physical distress.  As pinkish urine in catheter.   Skin: No rashes, lesions or ulcers.  HEENT: Atraumatic, normocephalic, supple neck, no obvious bleeding.  Core track in place Lungs: Gurgling throat sound, transmitted to chest.  Not in respiratory distress  CVS: Regular rate and rhythm, no murmur GI/Abd soft, nontender, nondistended, bowel sound present CNS: Alert, awake, oriented to place and person.  Confused about time. Psychiatry: Depressed look. Extremities: Left upper extremities significantly swollen.  Data Review: I have personally reviewed the laboratory data and studies available.  Recent Labs  Lab 12/06/19 0019 12/07/19 0038 12/08/19 0045 12/09/19 0322 12/10/19 0649  WBC 20.5* 12.3* 18.4* 13.5* 13.1*  NEUTROABS  --  10.7*  --  11.9* 11.4*  HGB 8.4* 6.6* 11.3* 10.7* 10.9*  HCT 26.0* 21.0* 34.2* 32.6* 34.1*  MCV 87.5 89.0 83.8 85.6 86.3  PLT 526* 511* 503* 491* 467*   Recent Labs  Lab  12/04/19 0453 12/04/19 0453 12/05/19 0445 12/06/19 0019 12/07/19 0038 12/09/19 0322 12/10/19 0649  NA 133*   < > 133* 130* 135 136 140  K 4.2   < > 4.9 4.8 3.9 4.0 3.8  CL 96*   < > 94* 95* 105 102 104  CO2 26   < > 30 25 23 27 27   GLUCOSE 114*   < > 133* 165* 107* 109* 114*  BUN 17   < > 14 16  13 10 9   CREATININE 0.65   < > 0.78 0.66 0.48* 0.55* 0.64  CALCIUM 8.5*   < > 8.7* 8.3* 7.0* 8.3* 8.2*  MG 2.1  --  2.1 2.0  --   --   --   PHOS 3.5  --  3.9 3.8  --   --   --    < > = values in this interval not displayed.    F/u labs ordered.  Signed, Terrilee Croak, MD [facility] 12/10/2019

## 2019-12-10 NOTE — Progress Notes (Signed)
Physical Therapy Treatment Patient Details Name: Evan [name] MRN: [id number] DOB: 09/02/1950 Today's Date: 12/10/2019    History of Present Illness (P) 69 y.o. male with medical history significant of lung cancer apparently stage IIIa status post chemo now in remission, recent hospitalization with recurrent pneumonia, COPD, depression, left renal mass, anxiety disorder, and traumatic R BKA, presents with confusion, weakness, and mild aphasia. CT and MRI demonstrate new left parieto-occipital infarct. On 11/22/2019 pt developing agitation and respiratory distress requiring BiPAP and haldol. Pt underwent bilateral cerebral arteriogram on 11/23/2019 demonstrating severe bilateral common carotid artery stenosis at proximal/mid levels. 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     PT Comments    Pt alert and fatigued on arrival, but agreeable to therapy as long as not back to recliner.  Emphasis on warm up A / resistive LE ROM , transitions to EOB and sitting balance/tolerance while OT worked on ROM and edema management of the L UE.   Follow Up Recommendations  SNF;Supervision/Assistance - 24 hour     Equipment Recommendations  Other (comment) (TBD)    Recommendations for Other Services       Precautions / Restrictions Precautions Precautions: (P) Fall Precaution Comments: (P) R BKA, prosthesis in room    Mobility  Bed Mobility Overal bed mobility: (P) Needs Assistance Bed Mobility: (P) Rolling;Sidelying to Sit;Sit to Supine     Supine to sit: (P) Max assist Sit to supine: (P) Mod assist   General bed mobility comments: (P) Able to  bridge hips  Transfers Overall transfer level: (P) Needs assistance   Transfers: (P) Sit to/from Stand Sit to Stand: (P) Max assist         General transfer comment: (P) pt declined OOB to chair  Ambulation/Gait                 Stairs             Wheelchair Mobility    Modified Rankin (Stroke Patients Only) Modified Rankin (Stroke  Patients Only) Pre-Morbid Rankin Score: Slight disability Modified Rankin: Severe disability     Balance Overall balance assessment: (P) Needs assistance Sitting-balance support: Single extremity supported;Feet supported Sitting balance-Leahy Scale: (P) Poor Sitting balance - Comments: listing to the R, sat EOB ~ 10 min working on balance while OT worked on mobilizing pt's swollen L UE     Standing balance-Leahy Scale: (P) Zero                              Cognition Arousal/Alertness: (P) Awake/alert Behavior During Therapy: (P) WFL for tasks assessed/performed Overall Cognitive Status: (P) Impaired/Different from baseline Area of Impairment: (P) Attention;Problem solving;Awareness;Safety/judgement                   Current Attention Level: (P) Selective Memory: (P) Decreased short-term memory Following Commands: (P) Follows one step commands with increased time Safety/Judgement: (P) Decreased awareness of safety;Decreased awareness of deficits Awareness: (P) Emergent Problem Solving: (P) Slow processing;Decreased initiation;Requires verbal cues General Comments: (P) pt with improved affect today, conversational and engaging. overall appears much improved compared to previous treatment session      Exercises General Exercises - Upper Extremity Shoulder Flexion: (P) Left;5 reps;AAROM;PROM;Supine Elbow Flexion: (P) Left;5 reps;Supine;AROM;AAROM Elbow Extension: (P) AROM;AAROM;Left;5 reps;Supine Digit Composite Flexion: (P) AAROM;Left;10 reps;Supine;Squeeze ball Composite Extension: (P) AROM;AAROM;Left;Supine Other Exercises Other Exercises: (P) hip/knee flex/ext with  AA to resistive ROM x 10 reps Other Exercises: AROM to R UE  and AA/PROM to L UE with for strengthening and edema management    General Comments General comments (skin integrity, edema, etc.): vss on O22      Pertinent Vitals/Pain Pain Assessment: (P) Faces Faces Pain Scale: (P) Hurts  little more Pain Location: (P) generalized with certain movements  Pain Descriptors / Indicators: (P) Discomfort Pain Intervention(s): (P) Monitored during session;Repositioned    Home Living                      Prior Function            PT Goals (current goals can now be found in the care plan section) Acute Rehab PT Goals Patient Stated Goal: (P) to improve mobility and activity tolerance PT Goal Formulation: With patient Time For Goal Achievement: 12/22/19 Potential to Achieve Goals: Fair Progress towards PT goals: Progressing toward goals (continue updated goals, delete stairs)    Frequency    Min 3X/week      PT Plan Current plan remains appropriate    Co-evaluation PT/OT/SLP Co-Evaluation/Treatment: Yes Reason for Co-Treatment: (P) For patient/therapist safety;To address functional/ADL transfers PT goals addressed during session: Mobility/safety with mobility        AM-PAC PT "6 Clicks" Mobility   Outcome Measure  Help needed turning from your back to your side while in a flat bed without using bedrails?: Total Help needed moving from lying on your back to sitting on the side of a flat bed without using bedrails?: Total Help needed moving to and from a bed to a chair (including a wheelchair)?: Total Help needed standing up from a chair using your arms (e.g., wheelchair or bedside chair)?: Total Help needed to walk in hospital room?: Total Help needed climbing 3-5 steps with a railing? : Total 6 Click Score: 6    End of Session Equipment Utilized During Treatment: Oxygen Activity Tolerance: Patient limited by fatigue;Patient tolerated treatment well Patient left: in bed;with call bell/phone within reach;with bed alarm set;with family/visitor present Nurse Communication: Mobility status PT Visit Diagnosis: Other abnormalities of gait and mobility (R26.89);Muscle weakness (generalized) (M62.81);Other symptoms and signs involving the nervous system  (R29.898);Hemiplegia and hemiparesis Hemiplegia - Right/Left: Left Hemiplegia - dominant/non-dominant: Non-dominant Hemiplegia - caused by: Cerebral infarction     Time: 4196-2229 PT Time Calculation (min) (ACUTE ONLY): 26 min  Charges:  $Therapeutic Activity: 8-22 mins                     12/10/2019  Ginger Carne., PT Acute Rehabilitation Services [phone]  (pager) [phone]  (office)   Tessie Fass Shayma Pfefferle 12/10/2019, 6:03 PM

## 2019-12-10 NOTE — TOC Progression Note (Signed)
Transition of Care [facility]) - Progression Note    Patient Details  Name: Evan [name] MRN: [id number] Date of Birth: 1950-05-13  Transition of Care [facility]) CM/SW [place], [place] Phone Number: 12/10/2019, 2:06 PM  Clinical Narrative:     CSW spoke with the patient's son, Vania Rea- he confirmed the family had chosen [facility] for short term rehab.   CSW updated RN- covid test requested  CSW started insurance authorization for SNF. Patient will need insurance authorization prior to discharge to SNF.  Thurmond Butts, MSW, LCSWA Clinical Social Worker   Expected Discharge Plan: Skilled Nursing Facility Barriers to Discharge: Ship broker, Continued Medical Work up  Expected Discharge Plan and Services Expected Discharge Plan: [place]                                               Social Determinants of Health (SDOH) Interventions    Readmission Risk Interventions No flowsheet data found.

## 2019-12-10 NOTE — Progress Notes (Signed)
Occupational Therapy Treatment Patient Details Name: ZADKIEL DRAGAN MRN: [id number] DOB: [birth year] Today's Date: 12/10/2019    History of present illness 69 y.o. male with medical history significant of lung cancer apparently stage IIIa status post chemo now in remission, recent hospitalization with recurrent pneumonia, COPD, depression, left renal mass, anxiety disorder, and traumatic R BKA, presents with confusion, weakness, and mild aphasia. CT and MRI demonstrate new left parieto-occipital infarct. On 11/22/2019 pt developing agitation and respiratory distress requiring BiPAP and haldol. Pt underwent bilateral cerebral arteriogram on 11/23/2019 demonstrating severe bilateral common carotid artery stenosis at proximal/mid levels. 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    OT comments  Pt fatigued from being up in recliner for much of today, but is agreeable to EOB activity given min encouragement. Pt with notable edema in LUE with AA/PROM provided across all planes, elevated end of session. Pt requiring at least minA for static balance EOB given fatigue levels. Despite fatigue pt with improved affect and cognition today, engaging and following majority of basic commands throughout session. VSS throughout. Feel SNF recommendation remains most appropriate at this time. Will continue per POC.    Follow Up Recommendations  SNF;Supervision/Assistance - 24 hour    Equipment Recommendations  3 in 1 bedside commode          Precautions / Restrictions Precautions Precautions: Fall Precaution Comments: R BKA, prosthesis in room Restrictions Weight Bearing Restrictions: No       Mobility Bed Mobility Overal bed mobility: Needs Assistance Bed Mobility: Rolling;Sidelying to Sit;Sit to Supine Rolling: Max assist Sidelying to sit: Max assist Supine to sit: Max assist;+2 for safety/equipment Sit to supine: Mod assist   General bed mobility comments: increased assist given fatigue   Transfers                  General transfer comment: not attempted given fatigue, pt reports having been up OOB much of today    Balance Overall balance assessment: Needs assistance Sitting-balance support: Single extremity supported;Feet supported Sitting balance-Leahy Scale: Poor Sitting balance - Comments: external assist                                    ADL either performed or assessed with clinical judgement   ADL Overall ADL's : Needs assistance/impaired                                       General ADL Comments: focus on seated activity EOB including LUE ROM given notable edema, educated both pt/pt's son in edema reduction techniques      Vision       Perception     Praxis      Cognition Arousal/Alertness: Awake/alert Behavior During Therapy: WFL for tasks assessed/performed Overall Cognitive Status: Impaired/Different from baseline Area of Impairment: Attention;Problem solving;Awareness;Safety/judgement                   Current Attention Level: Selective Memory: Decreased short-term memory Following Commands: Follows one step commands with increased time Safety/Judgement: Decreased awareness of safety;Decreased awareness of deficits Awareness: Emergent Problem Solving: Slow processing;Decreased initiation;Requires verbal cues General Comments: pt with improved affect today, conversational and engaging. overall appears much improved compared to previous treatment session        Exercises Exercises: Other exercises Other Exercises Other Exercises: hip/knee flex/ext with  AA  to resistive ROM x 10 reps Other Exercises: RUE AAROM with resistance1 Other Exercises: LUE P/AAROM across all planes for 10-20 reps each, retrograde massage to L hand/digits    Shoulder Instructions       General Comments vss on O22    Pertinent Vitals/ Pain       Pain Assessment: Faces Faces Pain Scale: Hurts little more Pain Location: generalized with  certain movements  Pain Descriptors / Indicators: Discomfort Pain Intervention(s): Monitored during session;Repositioned  Home Living                                          Prior Functioning/Environment              Frequency  Min 2X/week        Progress Toward Goals  OT Goals(current goals can now be found in the care plan section)  Progress towards OT goals: Progressing toward goals  Acute Rehab OT Goals Patient Stated Goal: to improve mobility and activity tolerance OT Goal Formulation: With patient Time For Goal Achievement: 12/21/19 Potential to Achieve Goals: Good ADL Goals Pt Will Perform Grooming: with supervision;sitting Pt Will Perform Lower Body Bathing: with min assist;sitting/lateral leans Pt Will Perform Upper Body Dressing: with supervision;sitting Pt Will Perform Lower Body Dressing: with min assist;sitting/lateral leans Pt Will Transfer to Toilet: with min assist;bedside commode;squat pivot transfer Pt/caregiver will Perform Home Exercise Program: Increased strength;Increased ROM;Both right and left upper extremity;With written HEP provided  Plan Discharge plan remains appropriate    Co-evaluation    PT/OT/SLP Co-Evaluation/Treatment: Yes Reason for Co-Treatment: For patient/therapist safety;To address functional/ADL transfers PT goals addressed during session: Mobility/safety with mobility        AM-PAC OT "6 Clicks" Daily Activity     Outcome Measure   Help from another person eating meals?: A Lot Help from another person taking care of personal grooming?: A Lot Help from another person toileting, which includes using toliet, bedpan, or urinal?: Total Help from another person bathing (including washing, rinsing, drying)?: A Lot Help from another person to put on and taking off regular upper body clothing?: A Lot Help from another person to put on and taking off regular lower body clothing?: Total 6 Click Score: 10     End of Session Equipment Utilized During Treatment: Oxygen  OT Visit Diagnosis: Muscle weakness (generalized) (M62.81);Other abnormalities of gait and mobility (R26.89);Hemiplegia and hemiparesis;Other symptoms and signs involving cognitive function;Pain Hemiplegia - Right/Left: Left Hemiplegia - dominant/non-dominant: Non-Dominant Hemiplegia - caused by: Cerebral infarction   Activity Tolerance Patient tolerated treatment well;Patient limited by fatigue   Patient Left in bed;with call bell/phone within reach;with family/visitor present   Nurse Communication Mobility status        Time: 5643-3295 OT Time Calculation (min): 26 min  Charges: OT General Charges $OT Visit: 1 Visit OT Treatments $Therapeutic Activity: 8-22 mins  Lou Cal, OT Acute Rehabilitation Services Pager [phone] Office [place] 12/10/2019, 6:05 PM

## 2019-12-10 NOTE — Progress Notes (Deleted)
_0  ID: Evan [name], male    DOB: 10-May-1950, 69 y.o.   MRN: [id number]  No chief complaint on file.   Referring provider: No ref. provider found  HPI:  69 year old male, former smoker quit in March 2021 (22-pack-year history).  Past medical history significant for COPD, stage IIIa non-small cell lung cancer status post chemo and radiation completed in 2017.  Patient also underwent SBRT right middle lobe lung node (completed 07/24/2018) as well as SBRT to left lower lobe nodule (completed on April 27, 2019).  Patient of Dr. Elsworth Soho, last seen by pulmonary nurse practitioner on 06/04/2019.  Recent hospitalization in September for pneumonia.  11/09/2019 Patient presents today for hospital follow-up.   Hospital course 10/28/2019-10/31/2019. Patient presented to ED with reports of fevers, chills, generalized weakness and malaise.  In ED patient was found to be tachypneic, tachycardic and hypoxemic.  CTA showed stable airspace disease in posterior left upper lobe and right lung base likely representing superimposed pneumonia.  Moderately increased prominence of paraesophageal and paraaortic lymph nodes likely representing metastatic disease.  CT abdomen demonstrated retroperitoneal periaortic LAD which represents metastatic disease, enlarged prostate.  Patient was treated with IV Rocephin, azithromycin and Solu-Medrol.  He was discharged home on prednisone, amoxicillin and doxycycline. Receiving home health PT.  Since hospital admission 10/28/19-10/31/19 he has been seen twice in the ED on 11/07/2019 for urinary retention and again on 11/09/2019 for hematuria. He saw Dr. 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 Patient is currently under observation.  Repeat CT scan head, chest and abdomen pelvis reviewed by Dr. Earlie Server which showed no concerning findings for disease progression.  Recommend continue observation with repeat CT scan of chest in 6 months.  He continues to  experience shortness of breath with cough on exertion. Cough is congested but non-productive. Complaint with Bevespi two puffs twice daily and had been using albuterol rescue inhaler 2-3 times a day. He has flutter valve which he has been using this three times a day. He is also taking 24m mucinex every 4 hours.   11/15/2019 Patient presents today for 1 week follow-up. He is not doing any better. Since last visit he has gone to the ED twice. [facility] hematuria [facility]09[facility]19 [facility] CXR showed no acute cardiopulmonary process, widening right paratracheal stripe. Patient was not admitted, there was a 16 hour wait in ED.  Patient is not doing any better since last visit. He finished doxycyline course yesterday. He continues to have an extremely congested cough with clear mucus production. Associated dyspnea, wheezing and weakness. He has been taking double strength mucinex and using flutter valve with no improvement. He has a extremly congested cough. He has not received nebulizer machine from DME company. Dr. AElsworth Sohoin room to speak with family, recommending admission for IV abx and bronchodilators.   12/11/2019- Interim hx Patient presents today for hospital follow-up. Patient was admitted for bilateral pneumonia end of September 2021, treated with IV Rocephin/azithromycin and Solu-Medrol.  He was discharged on prednisone, amoxicillin and doxycycline.  Clinically he continued to deteriorate in outpatient setting despite chest x-ray showing improvement. We advised patient return to the hospital on 11/15/19 where he was readmitted for 5 days.  Patient was again hospitalized on 11/21/2019 with acute stroke due to ischemia where he is currently present.   Allergies  Allergen Reactions  . Chantix [Varenicline] Other (See Comments)    Weird dreams  Immunization History  Administered Date(s) Administered  . Influenza Whole 03/25/2009  . Influenza,inj,Quad PF,6+  Mos 12/16/2014, 10/02/2015, 10/31/2019  . PFIZER SARS-COV-2 Vaccination 04/30/2019, 05/23/2019  . Pneumococcal Polysaccharide-23 03/25/2009, 06/04/2019  . Tdap 05/21/2012    Past Medical History:  Diagnosis Date  . Anxiety   . Complication of anesthesia    " sometimes I wake up during surgery "  . Depression   . Left renal mass 07/19/2016  . Lung cancer ([place]) dx'd 2010    NSCL CA right  . Lung cancer ([place]) dx'd 2017   left  . Stroke [facility])     Tobacco History: Social History   Tobacco Use  Smoking Status Former Smoker  . Packs/day: 0.50  . Years: 45.00  . Pack years: 22.50  . Types: Cigarettes  . Quit date: 04/02/2019  . Years since quitting: 0.6  Smokeless Tobacco Never Used   Counseling given: Not Answered   Facility-Administered Medications Prior to Visit  Medication Dose Route Frequency Provider Last Rate Last Admin  .  stroke: mapping our early stages of recovery book   Does not apply Once Gala Romney L, MD      . acetaminophen (TYLENOL) tablet 650 mg  650 mg Oral Q4H PRN Elwyn Reach, MD   650 mg at 12/05/19 2130   Or  . acetaminophen (TYLENOL) 160 MG/5ML solution 650 mg  650 mg Per Tube Q4H PRN Elwyn Reach, MD   650 mg at 12/06/19 2224   Or  . acetaminophen (TYLENOL) suppository 650 mg  650 mg Rectal Q4H PRN Gala Romney L, MD      . albuterol (PROVENTIL) (2.5 MG/3ML) 0.083% nebulizer solution 2.5 mg  2.5 mg Nebulization Q6H PRN Freddi Starr, MD   2.5 mg at 12/08/19 0305  . amoxicillin-clavulanate (AUGMENTIN) 875-125 MG per tablet 1 tablet  1 tablet Oral Q12H Dahal, Marlowe Aschoff, MD   1 tablet at 12/10/19 1613  . arformoterol (BROVANA) nebulizer solution 15 mcg  15 mcg Nebulization BID Freddi Starr, MD   15 mcg at 12/10/19 0828  . atorvastatin (LIPITOR) tablet 80 mg  80 mg Oral Daily Elwyn Reach, MD   80 mg at 12/10/19 0947  . budesonide (PULMICORT) nebulizer solution 0.25 mg  0.25 mg Nebulization BID Freda Jackson B, MD   0.25 mg at  12/10/19 0830  . chlorhexidine (PERIDEX) 0.12 % solution 15 mL  15 mL Mouth/Throat Once Hodierne, Adam, MD      . Chlorhexidine Gluconate Cloth 2 % PADS 6 each  6 each Topical Daily Eulogio Bear U, DO   6 each at 12/10/19 0900  . feeding supplement (ENSURE ENLIVE / ENSURE PLUS) liquid 237 mL  237 mL Oral TID BM Vann, Jessica U, DO   237 mL at 12/10/19 1613  . feeding supplement (OSMOLITE 1.2 CAL) liquid 1,000 mL  1,000 mL Per Tube Continuous Amin, Ankit Chirag, MD 60 mL/hr at 12/06/19 0600 Rate Verify at 12/06/19 0600  . feeding supplement (PROSource TF) liquid 45 mL  45 mL Per Tube Daily Amin, Ankit Chirag, MD   45 mL at 12/05/19 1000  . guaiFENesin (MUCINEX) 12 hr tablet 600 mg  600 mg Oral BID Shelly Coss, MD   600 mg at 12/10/19 0950  . hydrOXYzine (ATARAX/VISTARIL) tablet 25 mg  25 mg Oral TID PRN Eulogio Bear U, DO   25 mg at 12/09/19 0519  . insulin aspart (novoLOG) injection 0-6 Units  0-6 Units Subcutaneous Q4H Amin, Ankit Chirag,  MD   1 Units at 12/10/19 1638  . LORazepam (ATIVAN) injection 0.5 mg  0.5 mg Intravenous Q4H PRN Eulogio Bear U, DO   0.5 mg at 12/08/19 0254  . multivitamin with minerals tablet 1 tablet  1 tablet Oral Daily Eulogio Bear U, DO   1 tablet at 12/10/19 0947  . nicotine (NICODERM CQ - dosed in mg/24 hours) patch 14 mg  14 mg Transdermal Daily Shelly Coss, MD   14 mg at 12/10/19 0948  . pantoprazole (PROTONIX) EC tablet 40 mg  40 mg Oral BID Eulogio Bear U, DO   40 mg at 12/10/19 0950  . prednisoLONE tablet 30 mg  30 mg Oral Daily Dahal, Marlowe Aschoff, MD   30 mg at 12/10/19 0948  . QUEtiapine (SEROQUEL) tablet 25 mg  25 mg Oral BID Terrilee Croak, MD   25 mg at 12/10/19 0949  . revefenacin (YUPELRI) nebulizer solution 175 mcg  175 mcg Nebulization Daily Freddi Starr, MD   175 mcg at 12/10/19 0829  . scopolamine (TRANSDERM-SCOP) 1 MG/3DAYS 1.5 mg  1 patch Transdermal Q72H Amin, Ankit Chirag, MD   1.5 mg at 12/08/19 1150  . senna-docusate (Senokot-S) tablet  1 tablet  1 tablet Oral QHS PRN Elwyn Reach, MD      . tamsulosin (FLOMAX) capsule 0.4 mg  0.4 mg Oral QHS Vann, Jessica U, DO   0.4 mg at 12/09/19 2124  . ticagrelor (BRILINTA) tablet 30 mg  30 mg Oral BID Luanne Bras, MD   30 mg at 12/10/19 0947   Or  . ticagrelor (BRILINTA) tablet 30 mg  30 mg Per Tube BID Luanne Bras, MD       Outpatient Medications Prior to Visit  Medication Sig Dispense Refill  . acetaminophen (TYLENOL) 500 MG tablet Take 500 mg by mouth every 6 (six) hours as needed for mild pain, fever or headache.     . albuterol (VENTOLIN HFA) 108 (90 Base) MCG/ACT inhaler TAKE 2 PUFFS BY MOUTH EVERY 6 HOURS AS NEEDED FOR WHEEZE OR SHORTNESS OF BREATH (Patient taking differently: Inhale 2 puffs into the lungs every 6 (six) hours as needed for wheezing or shortness of breath. ) 18 g 3  . benzonatate (TESSALON) 100 MG capsule Take 1 capsule (100 mg total) by mouth every 8 (eight) hours. 21 capsule 0  . budesonide (PULMICORT) 0.5 MG/2ML nebulizer solution TAKE 2 MLS (0.5 MG TOTAL) BY NEBULIZATION 2 (TWO) TIMES DAILY. 360 mL 2  . [facility] ASPIRIN EC 325 MG EC tablet TAKE 1 TABLET BY MOUTH EVERY DAY (Patient taking differently: Take 325 mg by mouth daily. ) 90 tablet 1  . Glycopyrrolate-Formoterol (BEVESPI AEROSPHERE) 9-4.8 MCG/ACT AERO Inhale 2 puffs into the lungs 2 (two) times daily. 10.7 g 11  . guaiFENesin (MUCINEX) 600 MG 12 hr tablet Take 600 mg by mouth 2 (two) times daily as needed for cough.    [name] guaiFENesin (ROBITUSSIN) 100 MG/5ML liquid Take 200 mg by mouth 3 (three) times daily as needed for cough.    [name] ipratropium-albuterol (DUONEB) 0.5-2.5 (3) MG/3ML SOLN Take 3 mLs by nebulization every 6 (six) hours as needed. (Patient taking differently: Take 3 mLs by nebulization every 6 (six) hours as needed (sob). ) 360 mL 0  . nicotine (NICODERM CQ - DOSED IN MG/24 HOURS) 14 mg/24hr patch Place 1 patch (14 mg total) onto the skin daily. For 6 weeks.  Then decrease to 7 mg  daily. 28 patch 1  . nicotine polacrilex (NICORETTE)  4 MG gum Take 1 each (4 mg total) by mouth as needed for smoking cessation. (Patient not taking: Reported on 11/15/2019) 100 tablet 0  . tamsulosin (FLOMAX) 0.4 MG CAPS capsule Take 0.4 mg by mouth at bedtime.        Review of Systems  Review of Systems   Physical Exam  There were no vitals taken for this visit. Physical Exam   Lab Results:  CBC    Component Value Date/Time   WBC 13.1 (H) 12/10/2019 0649   RBC 3.95 (L) 12/10/2019 0649   HGB 10.9 (L) 12/10/2019 0649   HGB 14.8 08/03/2019 1135   HGB 14.6 07/16/2016 1220   HCT 34.1 (L) 12/10/2019 0649   HCT 44.7 07/16/2016 1220   PLT 467 (H) 12/10/2019 0649   PLT 413 (H) 08/03/2019 1135   PLT 367 07/16/2016 1220   MCV 86.3 12/10/2019 0649   MCV 89.0 07/16/2016 1220   MCH 27.6 12/10/2019 0649   MCHC 32.0 12/10/2019 0649   RDW 18.6 (H) 12/10/2019 0649   RDW 14.3 07/16/2016 1220   LYMPHSABS 0.7 12/10/2019 0649   LYMPHSABS 1.0 07/16/2016 1220   MONOABS 0.9 12/10/2019 0649   MONOABS 0.3 07/16/2016 1220   EOSABS 0.1 12/10/2019 0649   EOSABS 0.4 07/16/2016 1220   BASOSABS 0.0 12/10/2019 0649   BASOSABS 0.1 07/16/2016 1220    BMET    Component Value Date/Time   NA 140 12/10/2019 0649   NA 141 07/16/2016 1220   K 3.8 12/10/2019 0649   K 4.0 07/16/2016 1220   CL 104 12/10/2019 0649   CL 104 07/19/2012 1432   CO2 27 12/10/2019 0649   CO2 24 07/16/2016 1220   GLUCOSE 114 (H) 12/10/2019 0649   GLUCOSE 89 07/16/2016 1220   GLUCOSE 97 07/19/2012 1432   BUN 9 12/10/2019 0649   BUN 8.8 07/16/2016 1220   CREATININE 0.64 12/10/2019 0649   CREATININE 1.06 08/03/2019 1135   CREATININE 1.0 07/16/2016 1220   CALCIUM 8.2 (L) 12/10/2019 0649   CALCIUM 9.3 07/16/2016 1220   GFRNONAA >60 12/10/2019 0649   GFRNONAA >60 08/03/2019 1135   GFRAA >60 10/31/2019 0523   GFRAA >60 08/03/2019 1135    BNP    Component Value Date/Time   BNP 106.4 (H) 11/28/2019 1016     ProBNP No results found for: PROBNP  Imaging: CT ABDOMEN PELVIS WO CONTRAST  Result Date: 12/07/2019 CLINICAL DATA:  69 year old male status post bilateral femoral approach carotid stenting 12/06/2019 with decreasing hemoglobin overnight. Assess for retroperitoneal hemorrhage. EXAM: CT ABDOMEN AND PELVIS WITHOUT CONTRAST TECHNIQUE: Multidetector CT imaging of the abdomen and pelvis was performed following the standard protocol without IV contrast. COMPARISON:  CT abdomen/pelvis 10/28/2019; PET-CT 03/30/2019 FINDINGS: Lower chest: Chronic elevation of the left hemidiaphragm patchy ground-glass attenuation airspace opacities in the dependent portions of both the right and left lower lobe distributed in a peribronchovascular distribution. Diffuse mild bronchial wall thickening. Trace left pleural effusion. The visualized cardiac structures are within normal limits for size. Hepatobiliary: No focal liver abnormality is seen. No gallstones, gallbladder wall thickening, or biliary dilatation. Pancreas: Unremarkable. No pancreatic ductal dilatation or surrounding inflammatory changes. Spleen: Normal in size without focal abnormality. Adrenals/Urinary Tract: Limited evaluation in the absence of intravenous contrast. No hydronephrosis or nephrolithiasis. Normal adrenal glands. A Foley catheter is present within the bladder. Stomach/Bowel: Stomach is within normal limits. Appendix appears normal. No evidence of bowel wall thickening, distention, or inflammatory changes. Gastric tube present with the tip in  the stomach. Vascular/Lymphatic: Limited evaluation in the absence of intravenous contrast. Atherosclerotic calcifications present along the abdominal aorta and branch vessels. Progressive left para-aortic lymphadenopathy. Index lymph node measures up to 1.1 cm in short axis (image 43 series 3) compared to 0.7 cm previously. Reproductive: Massive prostatomegaly. The prostate gland measures 6.5 x 6.6 x 6.7 cm  (volume = 150 cm^3). Other: No evidence of groin or retroperitoneal hematoma. Musculoskeletal: No acute fracture or aggressive appearing lytic or blastic osseous lesion. IMPRESSION: 1. No evidence of groin or retroperitoneal hemorrhage. 2. Ground-glass attenuation airspace opacities and bronchial wall thickening in the dependent portions of both lower lungs. Imaging findings are suggestive of aspiration. 3. Progressive left para-aortic lymphadenopathy compared to prior imaging from 10/28/2019 and 03/30/2019. Given known history of non-small cell lung cancer, these findings are concerning for metastatic lymphadenopathy. Additional considerations include lymphoma. Consider further evaluation with PET-CT. 4. Marked prostatomegaly. 5. Additional ancillary findings as above. Electronically Signed   By: Jacqulynn Cadet M.D.   On: 12/07/2019 13:44   CT Code Stroke CTA Head W/WO contrast  Addendum Date: 11/21/2019   ADDENDUM REPORT: 11/21/2019 22:59 ADDENDUM: Study discussed by telephone with Dr. Vanita Panda in the ED on 11/21/2019 at 2252 hours. Electronically Signed   By: Genevie Ann M.D.   On: 11/21/2019 22:59   Result Date: 11/21/2019 CLINICAL DATA:  69 year old male with confusion, left upper extremity weakness. Plain head CT raising the possibility of small left parietal infarct. History of lung cancer EXAM: CT ANGIOGRAPHY HEAD AND NECK TECHNIQUE: Multidetector CT imaging of the head and neck was performed using the standard protocol during bolus administration of intravenous contrast. Multiplanar CT image reconstructions and MIPs were obtained to evaluate the vascular anatomy. Carotid stenosis measurements (when applicable) are obtained utilizing NASCET criteria, using the distal internal carotid diameter as the denominator. CONTRAST:  11m OMNIPAQUE IOHEXOL 350 MG/ML SOLN COMPARISON:  Plain head CT 1650 hours today. CTA head and neck 02/16/2017. Recent CTA chest 11/16/2019. FINDINGS: CTA NECK Skeleton: Stable  visualized osseous structures. Degenerative appearing heterogeneous sclerosis in the cervical spine. 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 Continued patchy opacity in the visible superior segment of the left lower lobe (series 6, image 157) from CTA 5 days ago. Other neck: Limited by extensive paravertebral and other bilateral neck venous contrast reflux. No neck mass or lymphadenopathy is evident. Aortic arch: Stable aortic arch since 2019 with mild atherosclerosis. Three vessel arch configuration. Right carotid system: Patent right CCA origin but high-grade right CCA stenosis in the lower neck at the level of of of the thyroid over a segment of about 14 mm, see series 9, image 158. Radiographic string sign stenosis on series 6, image 132 this is chronic but progressed since 2019. The right CCA remains patent. Negative right carotid bifurcation. Right ICA is patent without significant plaque or stenosis. Left carotid system: Patent left CCA origin but similar abrupt radiographic string sign stenosis of the left CCA in the lower neck (series 6, image 135 and series 9, image 148 over a segment of about 15 mm. Despite this the left CCA remains patent. This stenosis has not significantly changed since 2019. Negative left carotid bifurcation.  No cervical left ICA stenosis. Vertebral arteries: Limited cervical vertebral artery detail related to abundant paravertebral venous contrast, especially on the right where the vertebral is non dominant and diminutive. The right vertebral does remain patent to the skull base. Proximal left  subclavian artery and left vertebral origin are within normal limits. Tortuous left V1. Dominant left vertebral artery is patent to the skull base without visible stenosis. CTA HEAD Posterior circulation: Dominant left vertebral primarily supplies the basilar. Patent left PICA origin.  Diminutive right vertebral artery is patent to the vertebrobasilar junction with patent PICA origin, stable since 2019. Patent basilar artery without stenosis. Patent SCA and PCA origins. Posterior communicating arteries are diminutive or absent. Bilateral PCA branches are within normal limits. Anterior circulation: Both ICA siphons are patent. There is moderate supraclinoid segment atherosclerosis on the left resulting in mild supraclinoid stenosis. Cavernous and supraclinoid plaque and irregularity on the right also with mild stenosis. Patent carotid termini. Patent MCA and ACA origins. Dominant right ACA A1 as before. Anterior communicating artery and bilateral ACA branches are stable and within normal limits. Left MCA M1 segment and bifurcation are patent without stenosis. Left MCA branches are stable and within normal limits. Right MCA M1 segment and bifurcation are patent without stenosis. Right MCA branches are stable and within normal limits. Venous sinuses: Intracranial venous structures are patent. Left IJ venous reflux extends into the head as far as the torcula. Left upper extremity contrast injection with extensive paravertebral and other bilateral neck venous contrast reflux. 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 In particular, there is a suspicious pattern of contrast reflux into the left IJ, with the round central filling defect within the vein (series 6, image 100) which seems to abate at the angle of the mandible. Anatomic variants: Dominant left vertebral artery primarily supplies the basilar. Dominant right ACA A1. Review of the MIP images confirms the above findings IMPRESSION: 1. Negative for large vessel occlusion. 2. 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 Bilateral CCA RADIOGRAPHIC STRING SIGNS, stable on the left but progressed on the right since a 2019 CTA. Recommend Vascular  Surgery consultation. 3. 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 Left neck Venous Doppler Ultrasound should be confirmatory. 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 4. No carotid bifurcation plaque or stenosis. Bilateral ICA siphon plaque with only mild stenosis. Otherwise intracranial anterior and posterior circulation within normal limits. 5. 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 Continued abnormal opacity in the superior segment of the left lower lobe from the Chest CTA 5 days ago. Electronically Signed: By: Genevie Ann M.D. On: 11/21/2019 22:42   DG Abd 1 View  Result Date: 11/30/2019 CLINICAL DATA:  Feeding tube placement EXAM: ABDOMEN - 1 VIEW COMPARISON:  None. FINDINGS: Feeding tube passes through the stomach into the duodenum. Feeding tube tip in the second portion duodenum. Water-soluble contrast injected into the tube filling the duodenum and stomach. IMPRESSION: Feeding tube tip second portion of duodenum. Electronically Signed   By: Franchot Gallo M.D.   On: 11/30/2019 14:50   CT HEAD WO CONTRAST  Result Date: 11/21/2019 CLINICAL DATA:  Nystagmus.  Left upper arm weakness.  Confusion. EXAM: CT HEAD WITHOUT CONTRAST TECHNIQUE: Contiguous axial images were obtained from the base of the skull through the vertex without intravenous contrast. COMPARISON:  10/28/2019 FINDINGS: Brain: Area of low-density noted in the left parietal region not  seen on prior study compatible with acute to subacute infarction. No additional acute intracranial abnormality. No hemorrhage or hydrocephalus. Vascular: No hyperdense vessel or unexpected calcification. Skull: No acute calvarial abnormality. Sinuses/Orbits:  No acute findings Other: None IMPRESSION: Small area of low-density in the left parietal lobe compatible with acute to subacute infarction. Electronically Signed   By: Rolm Baptise M.D.   On: 11/21/2019 18:41   CT Code Stroke CTA Neck W/WO contrast  Addendum Date: 11/21/2019   ADDENDUM REPORT: 11/21/2019 22:59 ADDENDUM: Study discussed by telephone with Dr. Vanita Panda in the ED on 11/21/2019 at 2252 hours. Electronically Signed   By: Genevie Ann M.D.   On: 11/21/2019 22:59   Result Date: 11/21/2019 CLINICAL DATA:  69 year old male with confusion, left upper extremity weakness. Plain head CT raising the possibility of small left parietal infarct. History of lung cancer EXAM: CT ANGIOGRAPHY HEAD AND NECK TECHNIQUE: Multidetector CT imaging of the head and neck was performed using the standard protocol during bolus administration of intravenous contrast. Multiplanar CT image reconstructions and MIPs were obtained to evaluate the vascular anatomy. Carotid stenosis measurements (when applicable) are obtained utilizing NASCET criteria, using the distal internal carotid diameter as the denominator. CONTRAST:  50m OMNIPAQUE IOHEXOL 350 MG/ML SOLN COMPARISON:  Plain head CT 1650 hours today. CTA head and neck 02/16/2017. Recent CTA chest 11/16/2019. FINDINGS: CTA NECK Skeleton: Stable visualized osseous structures. Degenerative appearing heterogeneous sclerosis in the cervical spine. 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 Continued patchy opacity in the visible superior segment of the left lower lobe (series 6, image 157) from CTA 5 days ago. Other neck: Limited by extensive paravertebral and other bilateral neck venous contrast reflux. No neck mass or lymphadenopathy is evident. Aortic arch: Stable aortic arch since 2019 with mild atherosclerosis. Three vessel arch configuration. Right carotid system: Patent right  CCA origin but high-grade right CCA stenosis in the lower neck at the level of of of the thyroid over a segment of about 14 mm, see series 9, image 158. Radiographic string sign stenosis on series 6, image 132 this is chronic but progressed since 2019. The right CCA remains patent. Negative right carotid bifurcation. Right ICA is patent without significant plaque or stenosis. Left carotid system: Patent left CCA origin but similar abrupt radiographic string sign stenosis of the left CCA in the lower neck (series 6, image 135 and series 9, image 148 over a segment of about 15 mm. Despite this the left CCA remains patent. This stenosis has not significantly changed since 2019. Negative left carotid bifurcation.  No cervical left ICA stenosis. Vertebral arteries: Limited cervical vertebral artery detail related to abundant paravertebral venous contrast, especially on the right where the vertebral is non dominant and diminutive. The right vertebral does remain patent to the skull base. Proximal left subclavian artery and left vertebral origin are within normal limits. Tortuous left V1. Dominant left vertebral artery is patent to the skull base without visible stenosis. CTA HEAD Posterior circulation: Dominant left vertebral primarily supplies the basilar. Patent left PICA origin. Diminutive right vertebral artery is patent to the vertebrobasilar junction with patent PICA origin, stable since 2019. Patent basilar artery without stenosis. Patent SCA and PCA origins. Posterior communicating arteries are diminutive or absent. Bilateral PCA branches are within normal limits. Anterior circulation: Both ICA siphons are patent. There is moderate supraclinoid segment atherosclerosis on the left resulting in mild supraclinoid stenosis. Cavernous and supraclinoid plaque  and irregularity on the right also with mild stenosis. Patent carotid termini. Patent MCA and ACA origins. Dominant right ACA A1 as before. Anterior communicating  artery and bilateral ACA branches are stable and within normal limits. Left MCA M1 segment and bifurcation are patent without stenosis. Left MCA branches are stable and within normal limits. Right MCA M1 segment and bifurcation are patent without stenosis. Right MCA branches are stable and within normal limits. Venous sinuses: Intracranial venous structures are patent. Left IJ venous reflux extends into the head as far as the torcula. Left upper extremity contrast injection with extensive paravertebral and other bilateral neck venous contrast reflux. 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 In particular, there is a suspicious pattern of contrast reflux into the left IJ, with the round central filling defect within the vein (series 6, image 100) which seems to abate at the angle of the mandible. Anatomic variants: Dominant left vertebral artery primarily supplies the basilar. Dominant right ACA A1. Review of the MIP images confirms the above findings IMPRESSION: 1. Negative for large vessel occlusion. 2. 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 Bilateral CCA RADIOGRAPHIC STRING SIGNS, stable on the left but progressed on the right since a 2019 CTA. Recommend Vascular Surgery consultation. 3. 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 Left neck Venous Doppler Ultrasound should be confirmatory. 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 4. No carotid bifurcation plaque or stenosis. Bilateral ICA siphon plaque with only mild stenosis. Otherwise intracranial anterior and posterior circulation within normal limits. 5. 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 Continued abnormal opacity in the superior segment of the left lower lobe from the Chest CTA 5 days ago. Electronically Signed: By: Genevie Ann M.D. On: 11/21/2019 22:42   CT ANGIO CHEST PE W OR WO CONTRAST  Result Date: 11/16/2019 CLINICAL DATA:  Shortness of breath EXAM: CT ANGIOGRAPHY CHEST WITH CONTRAST TECHNIQUE: Multidetector CT imaging of the chest was performed using the standard protocol during bolus administration of intravenous contrast. Multiplanar CT image reconstructions and MIPs were obtained to evaluate the vascular anatomy. CONTRAST:  121m OMNIPAQUE IOHEXOL 350 MG/ML SOLN COMPARISON:  Chest x-ray from earlier in the same day, CT from 10/28/2019. FINDINGS: Cardiovascular: Thoracic aorta demonstrates atherosclerotic calcification without aneurysmal dilatation. No cardiac enlargement is seen. The pulmonary artery shows a normal branching pattern bilaterally. Some attenuation of the left upper lobe arterial branches is seen related to post radiation changes stable from the prior exam. Similar changes are noted in the upper lobe branches on the right related to post radiation change. No definitive filling defects to suggest pulmonary embolism are noted. No coronary calcifications are seen. Thoracic inlet is within normal limits. Mediastinum/Nodes: Thoracic inlet is within normal limits. There is again noted increased soft tissue density along the mediastinum bilaterally related to post radiation change. The hilar fullness seen on the right on the prior exam is less prominent on today's study. The esophagus is within normal limits. Lungs/Pleura: Lungs are well aerated bilaterally. Stable scarring in the lungs is noted along the mediastinum consistent with post radiation change. Persistent airspace change in the left lower lobe superior segment is  noted similar changes are noted in the right lower lobe laterally and stable. These may also represent areas of  scarring given their long-term stability. No sizable effusion or acute infiltrate is seen. Emphysematous changes are again seen. Upper Abdomen: Visualized upper abdomen is within normal limits. Musculoskeletal: No acute bony abnormality is seen. Review of the MIP images confirms the above findings. IMPRESSION: No evidence of pulmonary emboli. Overall stable appearance of scarring in the paramediastinal regions bilaterally. This attenuates some of the pulmonary arterial branches although no emboli are seen. Patchy airspace disease in the superior segment of the left lower lobe as well as the right lower lobe stable from the prior exam. Persistent infiltrate could not be totally excluded although these changes may represent scarring. Aortic Atherosclerosis (ICD10-I70.0) and Emphysema (ICD10-J43.9). Electronically Signed   By: Inez Catalina M.D.   On: 11/16/2019 18:02   MR ANGIO HEAD WO CONTRAST  Result Date: 11/22/2019 CLINICAL DATA:  Stroke, follow-up. Brain mass or lesion, history of small cell lung cancer, rule out metastases. EXAM: MRI HEAD WITH CONTRAST MRA HEAD WITHOUT CONTRAST TECHNIQUE: Multiplanar, multiecho pulse sequences of the brain and surrounding structures were obtained without and with intravenous contrast. Angiographic images of the head were obtained using MRA technique without contrast. CONTRAST:  59m GADAVIST GADOBUTROL 1 MMOL/ML IV SOLN COMPARISON:  Noncontrast head CT 11/21/2019, noncontrast brain MRI 11/21/2019, CT angiogram head/neck 11/21/2019. Brain MRI 08/29/2018. FINDINGS: MRI HEAD FINDINGS The examination is markedly motion degraded with severe or moderate/severe motion degradation of all post-contrast sequences. This precludes adequate evaluation for intracranial metastatic disease. There is predominantly gyriform enhancement within the left temporal occipital lobes (series 9, image 8). No other definite sites of abnormal intracranial enhancement are identified. MRA HEAD FINDINGS The  examination is moderate to severely motion degraded. This precludes adequate evaluation for intracranial arterial stenoses and limits evaluation for aneurysms. No intracranial large vessel occlusion is identified. Faint flow related signal is seen arising from the intracranial right vertebral artery. In correlating with the prior CTA of 11/21/2019, this vessel is developmentally diminutive. IMPRESSION: MRI brain: 1. Markedly motion degraded examination with severe or moderate/severe motion degradation of all acquired post-contrast sequences. This precludes adequate evaluation for intracranial metastatic disease, and a repeat examination is recommended when the patient is better able to tolerate study. 2. Predominantly gyriform enhancement within the left temporal occipital lobes. This may be related to the known acute/early subacute infarct at this site. However, attention is recommended on follow-up. 3. No other definite sites of abnormal intracranial enhancement are identified. MRA head: 1. Moderate to severely motion degraded examination, precluding adequate evaluation for intracranial arterial stenoses and limiting evaluation for aneurysms. 2. No intracranial large vessel occlusion is identified. Electronically Signed   By: KKellie SimmeringDO   On: 11/22/2019 07:59   MR BRAIN WO CONTRAST  Result Date: 11/21/2019 CLINICAL DATA:  Initial evaluation for neuro deficit, stroke suspected. EXAM: MRI HEAD WITHOUT CONTRAST TECHNIQUE: Multiplanar, multiecho pulse sequences of the brain and surrounding structures were obtained without intravenous contrast. COMPARISON:  Prior CT from earlier the same day. FINDINGS: Brain: Examination degraded by motion artifact. Cerebral volume within normal limits for age. Single tiny remote left cerebellar infarct noted. Patchy and confluent area of restricted diffusion seen involving the left temporal occipital region, consistent with an acute ischemic infarct (series 5, image 84).  Associated minimal petechial hemorrhage without hemorrhagic transformation or significant mass effect. Additional scattered predominantly subcentimeter cortical and subcortical ischemic infarcts seen involving the frontal and  parietal lobes bilaterally, somewhat linear in watershed in distribution (series 5, images 98, 92, 89). No associated hemorrhage or mass effect. No infratentorial ischemic changes. No other foci of susceptibility artifact to suggest acute or chronic intracranial hemorrhage. No mass lesion, midline shift or mass effect. No hydrocephalus or extra-axial fluid collection. Pituitary gland suprasellar region within normal limits. Midline structures intact. Vascular: Major intracranial vascular flow voids are grossly maintained. Skull and upper cervical spine: Craniocervical junction within normal limits. Bone marrow signal intensity normal. No scalp soft tissue abnormality. Sinuses/Orbits: Globes and orbital soft tissues within normal limits. Mild mucosal thickening noted within the maxillary sinuses. Paranasal sinuses are otherwise clear. No mastoid effusion. Other: None. IMPRESSION: 1. Acute ischemic infarct involving the left temporoccipital region, corresponding with abnormality on prior CT, and consistent with an acute ischemic infarct. Additional scattered cortical and subcortical infarcts involving the frontal and parietal lobes bilaterally, watershed in distribution. Constellation of findings suggest a recent hypotensive episode/hypoperfusion injury. Associated minimal petechial hemorrhage at the left temporoccipital infarct. No other associated hemorrhage or mass effect. 2. Underlying tiny remote left cerebellar infarct. Otherwise negative brain MRI for age. Electronically Signed   By: Jeannine Boga M.D.   On: 11/21/2019 20:16   MR BRAIN W CONTRAST  Result Date: 11/22/2019 CLINICAL DATA:  Stroke, follow-up. Brain mass or lesion, history of small cell lung cancer, rule out  metastases. EXAM: MRI HEAD WITH CONTRAST MRA HEAD WITHOUT CONTRAST TECHNIQUE: Multiplanar, multiecho pulse sequences of the brain and surrounding structures were obtained without and with intravenous contrast. Angiographic images of the head were obtained using MRA technique without contrast. CONTRAST:  44m GADAVIST GADOBUTROL 1 MMOL/ML IV SOLN COMPARISON:  Noncontrast head CT 11/21/2019, noncontrast brain MRI 11/21/2019, CT angiogram head/neck 11/21/2019. Brain MRI 08/29/2018. FINDINGS: MRI HEAD FINDINGS The examination is markedly motion degraded with severe or moderate/severe motion degradation of all post-contrast sequences. This precludes adequate evaluation for intracranial metastatic disease. There is predominantly gyriform enhancement within the left temporal occipital lobes (series 9, image 8). No other definite sites of abnormal intracranial enhancement are identified. MRA HEAD FINDINGS The examination is moderate to severely motion degraded. This precludes adequate evaluation for intracranial arterial stenoses and limits evaluation for aneurysms. No intracranial large vessel occlusion is identified. Faint flow related signal is seen arising from the intracranial right vertebral artery. In correlating with the prior CTA of 11/21/2019, this vessel is developmentally diminutive. IMPRESSION: MRI brain: 1. Markedly motion degraded examination with severe or moderate/severe motion degradation of all acquired post-contrast sequences. This precludes adequate evaluation for intracranial metastatic disease, and a repeat examination is recommended when the patient is better able to tolerate study. 2. Predominantly gyriform enhancement within the left temporal occipital lobes. This may be related to the known acute/early subacute infarct at this site. However, attention is recommended on follow-up. 3. No other definite sites of abnormal intracranial enhancement are identified. MRA head: 1. Moderate to severely motion  degraded examination, precluding adequate evaluation for intracranial arterial stenoses and limiting evaluation for aneurysms. 2. No intracranial large vessel occlusion is identified. Electronically Signed   By: KKellie SimmeringDO   On: 11/22/2019 07:59   IR INTRAVSC STENT CERV CAROTID W/O EMB-PROT MOD SED  Result Date: 12/07/2019 CLINICAL DATA:  Patient with right cerebral hemisphere stroke secondary to severe right common carotid artery stenosis. Also history of left common carotid artery stenosis. EXAM: INTRAVSC STENT CERV CAROTID W/O EMB-PROT COMPARISON:  Diagnostic catheter arteriogram of November 23, 2019. MEDICATIONS: Heparin 3,500 units IV.  Ancef 2 g IV antibiotic was administered within 1 hour of the procedure. ANESTHESIA/SEDATION: Mac anesthesia as per anesthesia service. CONTRAST:  Isovue 300 approximately 120 mL FLUOROSCOPY TIME:  Fluoroscopy Time: 25 minutes 42 seconds (5579 mGy). COMPLICATIONS: None immediate. TECHNIQUE: Informed written consent was obtained from the patient after a thorough discussion of the procedural risks, benefits and alternatives. All questions were addressed. Maximal Sterile Barrier Technique was utilized including caps, mask, sterile gowns, sterile gloves, sterile drape, hand hygiene and skin antiseptic. A timeout was performed prior to the initiation of the procedure. The left groin was prepped and draped in the usual sterile fashion. Thereafter using modified Seldinger technique, transfemoral access into the left common femoral artery was obtained without difficulty. Over a 0.035 inch guidewire, a 5 French Pinnacle sheath was inserted. Through this, and also over 0.035 inch guidewire, a 5 [place] JB 1 catheter was advanced to the aortic arch region and selectively positioned in the innominate artery, and the right common carotid artery. FINDINGS: The innominate arteriogram demonstrates the origin of the right subclavian artery to be widely patent. The right common carotid  artery in its proximal 1/3 is widely patent. Distal to this there is severe smooth tapered narrowing with more distal right common carotid artery proximal to the bifurcation to be widely patent. The right internal carotid artery at the bulb to the cranial skull base is also widely patent. Partial opacification of the right external carotid artery origin is seen. More distally the right internal carotid artery is seen to opacify widely to the cranial skull base. The petrous, the cavernous and the supraclinoid segments are widely patent. The right middle cerebral artery and the right anterior cerebral artery opacify into the capillary and venous phases. Prompt opacification via the anterior communicating artery of the left anterior cerebral A2 segment and distally is seen. ENDOVASCULAR REVASCULARIZATION OF SYMPTOMATIC SEVERE RIGHT COMMON CAROTID ARTERY STENOSIS WITH STENT ASSISTED ANGIOPLASTY The 0.035 inch Roadrunner guidewire was then gently advanced without any difficulty to the right common carotid bifurcation followed by the JB 1 catheter. The wire was removed. Good aspiration obtained from the hub of the 5 French diagnostic catheter. A gentle control arteriogram through this demonstrates safe positioning of the tip of the diagnostic catheter. The diagnostic catheter was then exchanged for an 014 inch 300 cm BMW micro guidewire. The micro guidewire tip was engaged in the right external carotid artery. The 5 French Pinnacle sheath was then replaced with an 8 [place] short Pinnacle sheath. This was then connected to continuous heparinized saline infusion. Over the exchange micro guidewire, an 80 cm 6 French Cook shuttle sheath was advanced and positioned just proximal to the severe right common carotid stenosis. A gentle control arteriogram performed through this demonstrated safe position of the tip of the [employer] sheath. Measurements were performed of the right common carotid artery distal to the severe  stenosis and proximally. It was decided to proceed with placement of a 6/8 x 40 mm Xact stent. This was prepped and purged with heparinized saline infusion in its housing. Using the rapid exchange technique, the delivery system of the stent was advanced without difficulty and positioned distal and proximally in the chosen positions. Once there, the stent was deployed in the usual manner without any difficulty. The delivery system was then retrieved and removed. A control arteriogram performed through the [facility] shuttle sheath in the proximal right common carotid artery demonstrated excellent apposition distally and proximally with the presence of a waist in the  mid segment. This prompted the advancement of a 5 mm x 30 mm Viatrac 14 balloon which had been prepped and purged with 50% contrast and 50% heparinized saline infusion. Again this was using the rapid exchange technique. The proximal and the distal markers were positioned adequate distant from the site of the intra stent stenosis. Control angioplasty was then performed using the micro inflation syringe device via micro tubing. Balloon was inflated to 10 atmospheres where it was maintained for approximately 10 seconds deflated and retrieved and removed. During this time the patient did not exhibit any change in his neurological status. The balloon was retrieved and removed. A control arteriogram performed medially and subsequently at 50 minutes later continued to demonstrate excellent flow through the stented segment without evidence of intraluminal filling defects. More distally, no evidence of intracranial filling defects or of occlusion was seen. There was an appreciably improved hemodynamic flow into the intracranial compartment with brisk flow into the mid MCA and ACA distributions and also into the contralateral left anterior cerebral artery. Under constant fluoroscopic guidance, the exchange micro guidewire was removed. A final control arteriogram  performed through the [facility] shuttle sheath in the right common carotid artery demonstrated continued improved flow extra cranially and intracranially. The [facility] shuttle sheath was removed. The 8 French Pinnacle sheath was removed with manual compression used to perform hemostasis over about 30 minutes. Distal pulses on the left side remained Dopplerable unchanged. Neurologically the patient was stable. On pronation there was improved power in the left upper extremity with excellent hand grip, and dorsal and plantar flexion of the left foot. The patient denied any headaches, nausea, vomiting or visual changes. He was then transferred to the neuro ICU for post revascularization management. IMPRESSION: Status post endovascular revascularization of symptomatic severe right common carotid artery stenosis with stent assisted angioplasty. PLAN: Follow-up in the clinic 2 to 3 weeks post discharge. Electronically Signed   By: Luanne Bras M.D.   On: 12/06/2019 17:24   IR US Guide Vasc Access Right  Result Date: 11/26/2019 CLINICAL DATA:  Left-sided weakness arm greater than leg. CT angiogram of the head and neck demonstrating high-grade stenosis of the common carotid arteries bilaterally. EXAM: BILATERAL COMMON CAROTID AND INNOMINATE ANGIOGRAPHY COMPARISON:  CT angiogram of the head and neck of November 30, 2019. MEDICATIONS: Heparin 2000 units IA. No antibiotic was administered within 1 hour of the procedure. ANESTHESIA/SEDATION: Versed 0.5 mg IV; Fentanyl 12.5 mcg IV Moderate Sedation Time:  35 minutes The patient was continuously monitored during the procedure by the interventional radiology nurse under my direct supervision. CONTRAST:  Omnipaque 300 approximately 65 mL FLUOROSCOPY TIME:  Fluoroscopy Time: 10 minutes 36 seconds (530 mGy). COMPLICATIONS: None immediate. TECHNIQUE: Informed written consent was obtained from the patient after a thorough discussion of the procedural risks, benefits and alternatives.  All questions were addressed. Maximal Sterile Barrier Technique was utilized including caps, mask, sterile gowns, sterile gloves, sterile drape, hand hygiene and skin antiseptic. A timeout was performed prior to the initiation of the procedure. The right forearm to the wrist was prepped and draped in the usual sterile manner. The right radial artery was then identified with ultrasound, and its morphology documented. A dorsal palmar anastomosis was verified to be present. Using ultrasound guidance, radial access was obtained using a micropuncture set over a 0.018 inch micro guidewire. A 4/5 French radial sheath was then inserted. The micro guidewire, and the obturator were removed. Good aspiration obtained from the side port of the radial  sheath. A cocktail of 1000 units of heparin, and 200 mcg of nitroglycerin was then infused through the radial sheath without event. A radial arteriogram was performed. Over a 0.035 inch Roadrunner guidewire, a Simmons 2 diagnostic catheter was then advanced to the aortic arch region, and selectively positioned in the right common carotid artery, the left common carotid artery, the left vertebral artery and the right subclavian artery. Following the procedure, hemostasis at the right radial puncture site was achieved with a wrist band. Distal right radial pulse was verified to be present. FINDINGS: The right common carotid arteriogram demonstrates segmental high-grade stenosis of 90% of the right common carotid artery at the junction of the middle and proximal 1/3 is seen. Distal to this opacification of the right common carotid artery and the right internal carotid artery is seen. There is a mild stenosis at the origin of the right internal carotid artery. More distally the petrous, the cavernous and the supraclinoid segments are widely patent. The right middle cerebral artery and the right anterior cerebral artery opacify into the capillary and venous phases. Cross-filling via the  anterior communicating artery of the left anterior cerebral A2 segment and distally is seen. No opacification of the right external carotid artery at its origin is seen. The right subclavian arteriogram demonstrates hypoplastic right vertebral artery with flow seen to the cranial skull base. Retrograde opacification of the right external carotid artery territory is seen via the ipsilateral right occipital artery. The left common carotid arteriogram demonstrates severe segmental stenosis of 90% plus at the junction of the middle and proximal third of the left common carotid artery. More distally post stenotic dilatation is seen. Patency is seen of the left internal carotid artery at the bulb to the cranial skull base. The petrous, the cavernous and the supraclinoid segments are widely patent. The left middle cerebral artery and the left anterior cerebral artery opacify into the capillary and venous phases. Non opacification of the left external carotid artery is seen. The dominant left vertebral artery origin is widely patent. The vessel opacifies to the cranial skull base. Patency is seen of the left vertebrobasilar junction, and the left posterior-inferior cerebellar artery. There is retrograde opacification of the left external carotid artery distribution via multiple musculoskeletal collaterals arising from the distal left vertebral artery. More distally the left vertebrobasilar junction, the basilar artery, the posterior cerebral arteries, the superior cerebellar arteries and the anterior-inferior cerebellar arteries is seen into the capillary and venous phases. Retrograde opacification of the right vertebrobasilar junction proximal to the right posterior-inferior cerebellar artery is seen. Partial retrograde opacification via the left posterior communicating artery of the left middle cerebral artery distribution is seen. 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 PLAN: Findings reviewed with the patient and referring neurologist. Given the symptomatic nature of the right common carotid artery stenosis, endovascular revascularization scheduled for early next week. Electronically Signed   By: Luanne Bras M.D.   On: 11/23/2019 16:25   DG CHEST PORT 1 VIEW  Result Date: 12/07/2019 CLINICAL DATA:  Dyspnea. EXAM: PORTABLE CHEST 1 VIEW COMPARISON:  December 06, 2019. FINDINGS: The heart size and mediastinal contours are within normal limits. Feeding tube is seen entering stomach. No pneumothorax or pleural effusion is noted. Left lung is clear. Ill-defined  nodular opacity seen in right midlung is again noted and may represent focal inflammation. The visualized skeletal structures are unremarkable. IMPRESSION: Ill-defined nodular opacity seen in right midlung is again noted and may represent focal inflammation. Electronically Signed   By: Marijo Conception M.D.   On: 12/07/2019 08:40   DG Chest Port 1 View  Result Date: 12/06/2019 CLINICAL DATA:  Fluid overload.  Hypotension. EXAM: PORTABLE CHEST 1 VIEW COMPARISON:  Radiograph 11/28/2019.  CT 11/16/2019. FINDINGS: Enteric tube in place with tip and side-port below the diaphragm in the stomach. The heart is normal in size. Stable mediastinal contours with right hilar prominence corresponding to ill-defined soft tissue on CT. Widening of the upper mediastinal contours corresponds to post radiation change. Vascular stent in the region of the right carotid. Chronic volume loss in the left hemithorax. Slight increase in interstitial markings which may represent pulmonary edema. No significant pleural effusion. No pneumothorax. Nonspecific vague nodular opacity in the right mid lung. The bones are under  mineralized. IMPRESSION: 1. Enteric tube in place with tip and side-port below the diaphragm in the stomach. 2. Slight increase in interstitial markings which may represent pulmonary edema given history of fluid overload. 3. Nonspecific ill-defined nodular opacity in the right mid lung. 4. Stable radiographic appearance of post treatment related changes. Electronically Signed   By: Keith Rake M.D.   On: 12/06/2019 17:15   DG Chest Port 1 View  Result Date: 11/28/2019 CLINICAL DATA:  Dyspnea EXAM: PORTABLE CHEST 1 VIEW COMPARISON:  Prior chest x-ray 11/25/2019 FINDINGS: Stable chronic atelectasis of the right upper lobe resulting in hyperinflation of the right lower and right middle lobes. Similar appearance of mild patchy airspace opacities in the right lung base. No significant interval progression. Cardiac and mediastinal contours remain unchanged. Chronic bronchitic changes are unchanged. No pleural effusion or pneumothorax. No acute osseous abnormality. IMPRESSION: Stable chest x-ray without significant interval change compared to 11/25/2019. Electronically Signed   By: Jacqulynn Cadet M.D.   On: 11/28/2019 09:08   DG CHEST PORT 1 VIEW  Result Date: 11/25/2019 CLINICAL DATA:  Pneumonia, history of lung cancer. EXAM: PORTABLE CHEST 1 VIEW COMPARISON:  November 22, 2019. FINDINGS: Stable cardiomediastinal silhouette. No pneumothorax or pleural effusion is noted. Left lung is clear. Slightly increased right basilar opacity is noted concerning for possible atelectasis or infiltrate. Stable bilateral hilar findings consistent with retraction or fibrosis. Bony thorax is unremarkable. IMPRESSION: Slightly increased right basilar opacity is noted concerning for possible atelectasis or infiltrate. Electronically Signed   By: Marijo Conception M.D.   On: 11/25/2019 12:24   DG CHEST PORT 1 VIEW  Result Date: 11/22/2019 CLINICAL DATA:  Dyspnea. EXAM: PORTABLE CHEST 1 VIEW COMPARISON:  November 21, 2019.  FINDINGS: The heart size and mediastinal contours are within normal limits. No pneumothorax or pleural effusion is noted. Stable findings consistent with bilateral hilar retraction. Right apical pleural thickening is noted which is unchanged. No acute pulmonary disease is noted. The visualized skeletal structures are unremarkable. IMPRESSION: No active disease. Electronically Signed   By: Marijo Conception M.D.   On: 11/22/2019 19:57   DG Chest Port 1 View  Result Date: 11/21/2019 CLINICAL DATA:  Shortness of breath EXAM: PORTABLE CHEST 1 VIEW COMPARISON:  11/16/2019, CT 11/16/2019, PET CT 03/30/2019 FINDINGS: No acute consolidation or effusion. Bilateral hilar retraction with bilateral upper paramediastinal opacity without change. Stable cardiac size. No pneumothorax IMPRESSION: No active disease. Similar appearance of bilateral hilar retraction and upper paramediastinal  opacity. Electronically Signed   By: Donavan Foil M.D.   On: 11/21/2019 20:54   DG Chest Port 1 View  Result Date: 11/16/2019 CLINICAL DATA:  Lung cancer.  Acute exacerbation COPD. EXAM: PORTABLE CHEST 1 VIEW COMPARISON:  CTA chest 10/28/2019 FINDINGS: Heart size is normal. Superior mediastinal soft tissue is again noted. Peripheral nodular opacity in the right lung is increasing in size. No edema is present. No effusions are present. IMPRESSION: Increasing size of peripheral nodular opacity in the right lung. Stable soft tissue the superior mediastinum. No acute cardiopulmonary disease. Electronically Signed   By: San Morelle M.D.   On: 11/16/2019 06:44   DG Swallowing Func-Speech Pathology  Result Date: 11/25/2019 Objective Swallowing Evaluation: Type of Study: MBS-Modified Barium Swallow Study  Patient Details Name: Evan [name] MRN: [id number] Date of Birth: 1950/09/27 Today's Date: 11/25/2019 Time: SLP Start Time (ACUTE ONLY): 1100 -SLP Stop Time (ACUTE ONLY): 1130 SLP Time Calculation (min) (ACUTE ONLY): 30 min Past  Medical History: Past Medical History: Diagnosis Date . Anxiety  . Complication of anesthesia   " sometimes I wake up during surgery " . Depression  . Left renal mass 07/19/2016 . Lung cancer ([place]) dx'd 2010   NSCL CA right . Lung cancer ([place]) dx'd 2017  left Past Surgical History: Past Surgical History: Procedure Laterality Date . AMPUTATION Right 05/21/2012  Procedure: AMPUTATION BELOW KNEE;  Surgeon: Newt Minion, MD;  Location: [facility];  Service: Orthopedics;  Laterality: Right; . AMPUTATION Right 06/21/2012  Procedure: Revision AMPUTATION BELOW KNEE Right;  Surgeon: Newt Minion, MD;  Location: [facility];  Service: Orthopedics;  Laterality: Right;  Revision right Below Knee Amputation . COLONOSCOPY W/ BIOPSIES AND POLYPECTOMY   . FOOT AMPUTATION Right   traumatic right lower extremity . UPPER JAW    SURGERY FOR INFECTION HPI: MATAEO INGWERSEN is a 69 y.o. 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 Now with worsening dysphagia over past 24-48 hours.  Subjective: alert but not very interactive Assessment / Plan / Recommendation CHL IP CLINICAL IMPRESSIONS 11/25/2019 Clinical Impression Patient presents with a moderate oropharyngeal dysphagia without aspiration but with incident of very trace penetration during swallow with thin liquids. Oral phase is characterized by weak oral control and movement of boluses, mastication was decreased and resulted in prolonged oral phase with regular solids. He exhibited premature spillage of thin and nectar liquids, delay in swallow initiation to level of vallecular sinus with puree solids and pyriform sinus with thin  liquids. Inital swallows of puree solids, regular solids resulted in moderate vallecular residuals and mild pharyngeal residuals. Patient independently initiated swallow which helped clear residuals and sips of thin liquids helped clear residuals in pharynx to trace to minimal in amount. Overall, patient's airway is well protected during swallow with all tested consistencies and he is safe to initiate oral diet at at this time. SLP Visit Diagnosis Dysphagia, oropharyngeal phase (R13.12) Attention and concentration deficit following -- Frontal lobe and executive function deficit following -- Impact on safety and function Mild aspiration risk   CHL IP TREATMENT RECOMMENDATION 11/25/2019 Treatment Recommendations Therapy as outlined in treatment plan below   Prognosis 11/25/2019 Prognosis for Safe Diet Advancement Good Barriers  to Reach Goals -- Barriers/Prognosis Comment -- CHL IP DIET RECOMMENDATION 11/25/2019 SLP Diet Recommendations Thin liquid;Dysphagia 1 (Puree) solids Liquid Administration via Cup;Straw Medication Administration Whole meds with puree Compensations Minimize environmental distractions;Slow rate;Small sips/bites;Follow solids with liquid Postural Changes Remain semi-upright after after feeds/meals (Comment);Seated upright at 90 degrees   CHL IP OTHER RECOMMENDATIONS 11/25/2019 Recommended Consults -- Oral Care Recommendations Oral care BID Other Recommendations --   CHL IP FOLLOW UP RECOMMENDATIONS 11/25/2019 Follow up Recommendations 24 hour supervision/assistance;Skilled Nursing facility;Home health SLP   CHL IP FREQUENCY AND DURATION 11/25/2019 Speech Therapy Frequency (ACUTE ONLY) min 2x/week Treatment Duration --      CHL IP ORAL PHASE 11/25/2019 Oral Phase Impaired Oral - Pudding Teaspoon -- Oral - Pudding Cup -- Oral - Honey Teaspoon -- Oral - Honey Cup -- Oral - Nectar Teaspoon -- Oral - Nectar Cup Weak lingual manipulation;Premature spillage Oral - Nectar Straw -- Oral - Thin Teaspoon --  Oral - Thin Cup Premature spillage Oral - Thin Straw Premature spillage Oral - Puree Premature spillage Oral - Mech Soft -- Oral - Regular Delayed oral transit;Weak lingual manipulation Oral - Multi-Consistency -- Oral - Pill Impaired mastication;Weak lingual manipulation;Delayed oral transit Oral Phase - Comment --  CHL IP PHARYNGEAL PHASE 11/25/2019 Pharyngeal Phase Impaired Pharyngeal- Pudding Teaspoon -- Pharyngeal -- Pharyngeal- Pudding Cup -- Pharyngeal -- Pharyngeal- Honey Teaspoon -- Pharyngeal -- Pharyngeal- Honey Cup -- Pharyngeal -- Pharyngeal- Nectar Teaspoon -- Pharyngeal -- Pharyngeal- Nectar Cup Delayed swallow initiation-vallecula;Pharyngeal residue - valleculae;Pharyngeal residue - pyriform Pharyngeal -- Pharyngeal- Nectar Straw -- Pharyngeal -- Pharyngeal- Thin Teaspoon -- Pharyngeal -- Pharyngeal- Thin Cup Delayed swallow initiation-vallecula;Delayed swallow initiation-pyriform sinuses;Penetration/Aspiration during swallow;Pharyngeal residue - valleculae;Pharyngeal residue - pyriform Pharyngeal Material enters airway, remains ABOVE vocal cords then ejected out Pharyngeal- Thin Straw Delayed swallow initiation-pyriform sinuses;Delayed swallow initiation-vallecula Pharyngeal -- Pharyngeal- Puree Delayed swallow initiation-vallecula;Pharyngeal residue - valleculae Pharyngeal -- Pharyngeal- Mechanical Soft -- Pharyngeal -- Pharyngeal- Regular Pharyngeal residue - valleculae Pharyngeal -- Pharyngeal- Multi-consistency -- Pharyngeal -- Pharyngeal- Pill WFL Pharyngeal -- Pharyngeal Comment --  CHL IP CERVICAL ESOPHAGEAL PHASE 11/25/2019 Cervical Esophageal Phase WFL Pudding Teaspoon -- Pudding Cup -- Honey Teaspoon -- Honey Cup -- Nectar Teaspoon -- Nectar Cup -- Nectar Straw -- Thin Teaspoon -- Thin Cup -- Thin Straw -- Puree -- Mechanical Soft -- Regular -- Multi-consistency -- Pill -- Cervical Esophageal Comment -- Sonia Baller, MA, CCC-SLP Speech Therapy [facility]             ECHOCARDIOGRAM  COMPLETE  Result Date: 11/22/2019    ECHOCARDIOGRAM REPORT   Patient Name:   HECTOR TAFT Date of Exam: 11/22/2019 Medical Rec #:  [id number]       Height:       65.0 in Accession #:    [id number]      Weight:       106.0 lb Date of Birth:  [birth year]       BSA:          1.510 m Patient Age:    86 years        BP:           117/76 mmHg Patient Gender: M               HR:           106 bpm. Exam Location:  Inpatient Procedure: 2D Echo, Cardiac Doppler and Color Doppler Indications:    Stroke 434.91 / I163.9  History:  Patient has no prior history of Echocardiogram examinations.                 COPD; Risk Factors:Current Smoker and Dyslipidemia.  Sonographer:    Bernadene Person RDCS Referring Phys: [place]  1. Left ventricular ejection fraction, by estimation, is 55 to 60%. The left ventricle has normal function. The left ventricle has no regional wall motion abnormalities. Left ventricular diastolic parameters are consistent with Grade I diastolic dysfunction (impaired relaxation). Elevated left atrial pressure.  2. Right ventricular systolic function is normal. The right ventricular size is normal.  3. The mitral valve is normal in structure. Trivial mitral valve regurgitation. No evidence of mitral stenosis.  4. The aortic valve is normal in structure. Aortic valve regurgitation is not visualized. No aortic stenosis is present.  5. The inferior vena cava is normal in size with greater than 50% respiratory variability, suggesting right atrial pressure of 3 mmHg. Comparison(s): No prior Echocardiogram. FINDINGS  Left Ventricle: Left ventricular ejection fraction, by estimation, is 55 to 60%. The left ventricle has normal function. The left ventricle has no regional wall motion abnormalities. The left ventricular internal cavity size was normal in size. There is  no left ventricular hypertrophy. Left ventricular diastolic parameters are consistent with Grade I diastolic dysfunction  (impaired relaxation). Elevated left atrial pressure. Right Ventricle: The right ventricular size is normal. No increase in right ventricular wall thickness. Right ventricular systolic function is normal. Left Atrium: Left atrial size was normal in size. Right Atrium: Right atrial size was normal in size. Pericardium: There is no evidence of pericardial effusion. Mitral Valve: The mitral valve is normal in structure. Trivial mitral valve regurgitation. No evidence of mitral valve stenosis. Tricuspid Valve: The tricuspid valve is normal in structure. Tricuspid valve regurgitation is trivial. No evidence of tricuspid stenosis. Aortic Valve: The aortic valve is normal in structure. Aortic valve regurgitation is not visualized. No aortic stenosis is present. Pulmonic Valve: The pulmonic valve was normal in structure. Pulmonic valve regurgitation is not visualized. No evidence of pulmonic stenosis. Aorta: The aortic root is normal in size and structure. Venous: The inferior vena cava is normal in size with greater than 50% respiratory variability, suggesting right atrial pressure of 3 mmHg. IAS/Shunts: No atrial level shunt detected by color flow Doppler.  LEFT VENTRICLE PLAX 2D LVIDd:         3.70 cm  Diastology LVIDs:         2.10 cm  LV e' medial:    3.92 cm/s LV PW:         0.80 cm  LV E/e' medial:  14.5 LV IVS:        0.80 cm  LV e' lateral:   9.67 cm/s LVOT diam:     2.10 cm  LV E/e' lateral: 5.9 LV SV:         68 LV SV Index:   45 LVOT Area:     3.46 cm  RIGHT VENTRICLE RV S prime:     12.00 cm/s TAPSE (M-mode): 1.9 cm LEFT ATRIUM             Index      RIGHT ATRIUM          Index LA diam:        1.90 cm 1.26 cm/m RA Area:     7.97 cm LA Vol (A2C):   12.0 ml 7.95 ml/m RA Volume:   14.60 ml 9.67 ml/m LA Vol (  A4C):   12.9 ml 8.54 ml/m LA Biplane Vol: 12.6 ml 8.34 ml/m  AORTIC VALVE LVOT Vmax:   108.00 cm/s LVOT Vmean:  70.900 cm/s LVOT VTI:    0.196 m  AORTA Ao Root diam: 3.50 cm MITRAL VALVE MV Area (PHT):  2.37 cm    SHUNTS MV Decel Time: 320 msec    Systemic VTI:  0.20 m MV E velocity: 56.90 cm/s  Systemic Diam: 2.10 cm MV A velocity: 67.20 cm/s MV E/A ratio:  0.85 Ena Dawley MD Electronically signed by Ena Dawley MD Signature Date/Time: 11/22/2019/6:53:37 PM    Final    VAS [place] UPPER EXTREMITY VENOUS DUPLEX  Result Date: 12/07/2019 UPPER VENOUS STUDY  Indications: Swelling, Edema, and History of LUE DVT on 11/22/19 Other Indications: Status post right common carotid artery stenting on 12/06/2019. Risk Factors: Cancer Lung COPD, pneumonia. Comparison Study: 11/22/19 - LUE venous. Performing Technologist: Oda Cogan RDMS, RVT  Examination Guidelines: A complete evaluation includes B-mode imaging, spectral Doppler, color Doppler, and power Doppler as needed of all accessible portions of each vessel. Bilateral testing is considered an integral part of a complete examination. Limited examinations for reoccurring indications may be performed as noted.  Right Findings: +----------+------------+---------+-----------+----------+-------+ RIGHT     CompressiblePhasicitySpontaneousPropertiesSummary +----------+------------+---------+-----------+----------+-------+ Subclavian               Yes       Yes                      +----------+------------+---------+-----------+----------+-------+  Left Findings: +----------+------------+---------+-----------+----------+-------+ LEFT      CompressiblePhasicitySpontaneousPropertiesSummary +----------+------------+---------+-----------+----------+-------+ IJV           None       No        No                Acute  +----------+------------+---------+-----------+----------+-------+ Subclavian    None       No        No                Acute  +----------+------------+---------+-----------+----------+-------+ Axillary      None       No        No                Acute  +----------+------------+---------+-----------+----------+-------+  Brachial    Partial                                  Acute  +----------+------------+---------+-----------+----------+-------+ Radial        Full                                          +----------+------------+---------+-----------+----------+-------+ Ulnar         Full                                          +----------+------------+---------+-----------+----------+-------+ Cephalic      Full                                          +----------+------------+---------+-----------+----------+-------+ Basilic       None  Acute  +----------+------------+---------+-----------+----------+-------+  Summary:  Right: No evidence of thrombosis in the subclavian.  Left: Findings consistent with acute deep vein thrombosis involving the left internal jugular vein, left subclavian vein, left axillary vein and left brachial veins. Findings consistent with acute superficial vein thrombosis involving the left basilic vein. Findings for deep vein thrombosis appear unchanged from previous study.  *See table(s) above for measurements and observations.  Diagnosing physician: Servando Snare MD Electronically signed by Servando Snare MD on 12/07/2019 at 4:03:30 PM.    Final    VAS [place] UPPER EXTREMITY VENOUS DUPLEX  Result Date: 11/22/2019 UPPER VENOUS STUDY  Indications: Swelling, and Pain Risk Factors: Cancer Lung. Performing Technologist: Griffin Basil RCT RDMS  Examination Guidelines: A complete evaluation includes B-mode imaging, spectral Doppler, color Doppler, and power Doppler as needed of all accessible portions of each vessel. Bilateral testing is considered an integral part of a complete examination. Limited examinations for reoccurring indications may be performed as noted.  Left Findings: +----------+------------+---------+-----------+----------+-------+ LEFT      CompressiblePhasicitySpontaneousPropertiesSummary  +----------+------------+---------+-----------+----------+-------+ IJV           None       No        No                       +----------+------------+---------+-----------+----------+-------+ Subclavian    None       No        No                       +----------+------------+---------+-----------+----------+-------+ Axillary      None       No        No                       +----------+------------+---------+-----------+----------+-------+ Brachial    Partial      Yes       Yes                      +----------+------------+---------+-----------+----------+-------+ Radial        Full                                          +----------+------------+---------+-----------+----------+-------+ Ulnar         Full                                          +----------+------------+---------+-----------+----------+-------+ Cephalic      Full                                          +----------+------------+---------+-----------+----------+-------+ Basilic       Full                                          +----------+------------+---------+-----------+----------+-------+  Summary:  Right: No evidence of thrombosis in the subclavian.  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 Previous Cat Scan  Yesterday.  *See table(s) above for measurements and observations.  Diagnosing physician: Servando Snare MD Electronically signed by Servando Snare MD on 11/22/2019 at 4:11:26 PM.    Final    IR ANGIO INTRA EXTRACRAN SEL COM CAROTID INNOMINATE BILAT MOD SED  Result Date: 11/26/2019 CLINICAL DATA:  Left-sided weakness arm greater than leg. CT angiogram of the head and neck demonstrating high-grade stenosis of the common carotid arteries bilaterally. EXAM: BILATERAL COMMON CAROTID AND INNOMINATE ANGIOGRAPHY COMPARISON:  CT angiogram of the head  and neck of November 30, 2019. MEDICATIONS: Heparin 2000 units IA. No antibiotic was administered within 1 hour of the procedure. ANESTHESIA/SEDATION: Versed 0.5 mg IV; Fentanyl 12.5 mcg IV Moderate Sedation Time:  35 minutes The patient was continuously monitored during the procedure by the interventional radiology nurse under my direct supervision. CONTRAST:  Omnipaque 300 approximately 65 mL FLUOROSCOPY TIME:  Fluoroscopy Time: 10 minutes 36 seconds (530 mGy). COMPLICATIONS: None immediate. TECHNIQUE: Informed written consent was obtained from the patient after a thorough discussion of the procedural risks, benefits and alternatives. All questions were addressed. Maximal Sterile Barrier Technique was utilized including caps, mask, sterile gowns, sterile gloves, sterile drape, hand hygiene and skin antiseptic. A timeout was performed prior to the initiation of the procedure. The right forearm to the wrist was prepped and draped in the usual sterile manner. The right radial artery was then identified with ultrasound, and its morphology documented. A dorsal palmar anastomosis was verified to be present. Using ultrasound guidance, radial access was obtained using a micropuncture set over a 0.018 inch micro guidewire. A 4/5 French radial sheath was then inserted. The micro guidewire, and the obturator were removed. Good aspiration obtained from the side port of the radial sheath. A cocktail of 1000 units of heparin, and 200 mcg of nitroglycerin was then infused through the radial sheath without event. A radial arteriogram was performed. Over a 0.035 inch Roadrunner guidewire, a Simmons 2 diagnostic catheter was then advanced to the aortic arch region, and selectively positioned in the right common carotid artery, the left common carotid artery, the left vertebral artery and the right subclavian artery. Following the procedure, hemostasis at the right radial puncture site was achieved with a wrist band. Distal right  radial pulse was verified to be present. FINDINGS: The right common carotid arteriogram demonstrates segmental high-grade stenosis of 90% of the right common carotid artery at the junction of the middle and proximal 1/3 is seen. Distal to this opacification of the right common carotid artery and the right internal carotid artery is seen. There is a mild stenosis at the origin of the right internal carotid artery. More distally the petrous, the cavernous and the supraclinoid segments are widely patent. The right middle cerebral artery and the right anterior cerebral artery opacify into the capillary and venous phases. Cross-filling via the anterior communicating artery of the left anterior cerebral A2 segment and distally is seen. No opacification of the right external carotid artery at its origin is seen. The right subclavian arteriogram demonstrates hypoplastic right vertebral artery with flow seen to the cranial skull base. Retrograde opacification of the right external carotid artery territory is seen via the ipsilateral right occipital artery. The left common carotid arteriogram demonstrates severe segmental stenosis of 90% plus at the junction of the middle and proximal third of the left common carotid artery. More distally post stenotic dilatation is seen. Patency is seen of the left internal carotid artery at the bulb to the cranial skull base. The  petrous, the cavernous and the supraclinoid segments are widely patent. The left middle cerebral artery and the left anterior cerebral artery opacify into the capillary and venous phases. Non opacification of the left external carotid artery is seen. The dominant left vertebral artery origin is widely patent. The vessel opacifies to the cranial skull base. Patency is seen of the left vertebrobasilar junction, and the left posterior-inferior cerebellar artery. There is retrograde opacification of the left external carotid artery distribution via multiple  musculoskeletal collaterals arising from the distal left vertebral artery. More distally the left vertebrobasilar junction, the basilar artery, the posterior cerebral arteries, the superior cerebellar arteries and the anterior-inferior cerebellar arteries is seen into the capillary and venous phases. Retrograde opacification of the right vertebrobasilar junction proximal to the right posterior-inferior cerebellar artery is seen. Partial retrograde opacification via the left posterior communicating artery of the left middle cerebral artery distribution is seen. 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 PLAN: Findings reviewed with the patient and referring neurologist. Given the symptomatic nature of the right common carotid artery stenosis, endovascular revascularization scheduled for early next week. Electronically Signed   By: Luanne Bras M.D.   On: 11/23/2019 16:25   IR ANGIO VERTEBRAL SEL VERTEBRAL UNI L MOD SED  Result Date: 11/26/2019 CLINICAL DATA:  Left-sided weakness arm greater than leg. CT angiogram of the head and neck demonstrating high-grade stenosis of the common carotid arteries bilaterally. EXAM: BILATERAL COMMON CAROTID AND INNOMINATE ANGIOGRAPHY COMPARISON:  CT angiogram of the head and neck of November 30, 2019. MEDICATIONS: Heparin 2000 units IA. No antibiotic was administered within 1 hour of the procedure. ANESTHESIA/SEDATION: Versed 0.5 mg IV; Fentanyl 12.5 mcg IV Moderate Sedation Time:  35 minutes The patient was continuously monitored during the procedure by the interventional radiology nurse under my direct supervision. CONTRAST:  Omnipaque 300 approximately 65 mL FLUOROSCOPY TIME:  Fluoroscopy Time: 10  minutes 36 seconds (530 mGy). COMPLICATIONS: None immediate. TECHNIQUE: Informed written consent was obtained from the patient after a thorough discussion of the procedural risks, benefits and alternatives. All questions were addressed. Maximal Sterile Barrier Technique was utilized including caps, mask, sterile gowns, sterile gloves, sterile drape, hand hygiene and skin antiseptic. A timeout was performed prior to the initiation of the procedure. The right forearm to the wrist was prepped and draped in the usual sterile manner. The right radial artery was then identified with ultrasound, and its morphology documented. A dorsal palmar anastomosis was verified to be present. Using ultrasound guidance, radial access was obtained using a micropuncture set over a 0.018 inch micro guidewire. A 4/5 French radial sheath was then inserted. The micro guidewire, and the obturator were removed. Good aspiration obtained from the side port of the radial sheath. A cocktail of 1000 units of heparin, and 200 mcg of nitroglycerin was then infused through the radial sheath without event. A radial arteriogram was performed. Over a 0.035 inch Roadrunner guidewire, a Simmons 2 diagnostic catheter was then advanced to the aortic arch region, and selectively positioned in the right common carotid artery, the left common carotid artery, the left vertebral artery and the right subclavian artery. Following the procedure, hemostasis at the right radial puncture site was achieved with a wrist band. Distal right radial pulse was verified to be  present. FINDINGS: The right common carotid arteriogram demonstrates segmental high-grade stenosis of 90% of the right common carotid artery at the junction of the middle and proximal 1/3 is seen. Distal to this opacification of the right common carotid artery and the right internal carotid artery is seen. There is a mild stenosis at the origin of the right internal carotid artery. More distally the  petrous, the cavernous and the supraclinoid segments are widely patent. The right middle cerebral artery and the right anterior cerebral artery opacify into the capillary and venous phases. Cross-filling via the anterior communicating artery of the left anterior cerebral A2 segment and distally is seen. No opacification of the right external carotid artery at its origin is seen. The right subclavian arteriogram demonstrates hypoplastic right vertebral artery with flow seen to the cranial skull base. Retrograde opacification of the right external carotid artery territory is seen via the ipsilateral right occipital artery. The left common carotid arteriogram demonstrates severe segmental stenosis of 90% plus at the junction of the middle and proximal third of the left common carotid artery. More distally post stenotic dilatation is seen. Patency is seen of the left internal carotid artery at the bulb to the cranial skull base. The petrous, the cavernous and the supraclinoid segments are widely patent. The left middle cerebral artery and the left anterior cerebral artery opacify into the capillary and venous phases. Non opacification of the left external carotid artery is seen. The dominant left vertebral artery origin is widely patent. The vessel opacifies to the cranial skull base. Patency is seen of the left vertebrobasilar junction, and the left posterior-inferior cerebellar artery. There is retrograde opacification of the left external carotid artery distribution via multiple musculoskeletal collaterals arising from the distal left vertebral artery. More distally the left vertebrobasilar junction, the basilar artery, the posterior cerebral arteries, the superior cerebellar arteries and the anterior-inferior cerebellar arteries is seen into the capillary and venous phases. Retrograde opacification of the right vertebrobasilar junction proximal to the right posterior-inferior cerebellar artery is seen. Partial  retrograde opacification via the left posterior communicating artery of the left middle cerebral artery distribution is seen. 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 PLAN: Findings reviewed with the patient and referring neurologist. Given the symptomatic nature of the right common carotid artery stenosis, endovascular revascularization scheduled for early next week. Electronically Signed   By: Luanne Bras M.D.   On: 11/23/2019 16:25     Assessment & Plan:   No problem-specific Assessment & Plan notes found for this encounter.     Martyn Ehrich, NP 12/10/2019

## 2019-12-10 NOTE — TOC Progression Note (Signed)
Transition of Care [facility]) - Progression Note    Patient Details  Name: Evan [name] MRN: [id number] Date of Birth: Jan 03, 1951  Transition of Care [facility]) CM/SW [place], [place] Phone Number: 12/10/2019, 6:12 PM  Clinical Narrative:     Insurance still pending CSW  waiting on confirmation of availability at SNF/[facility] [place], MSW, [place] Worker  Expected Discharge Plan: [place] Barriers to Discharge: Ship broker, Continued Medical Work up  [employer] and Services Expected Discharge Plan: [place]                                               Social Determinants of Health (SDOH) Interventions    Readmission Risk Interventions No flowsheet data found.

## 2019-12-10 NOTE — Progress Notes (Signed)
  Speech Language Pathology Treatment: Dysphagia  Patient Details Name: Evan [name] MRN: [id number] DOB: [birth year] Today's Date: 12/10/2019 Time: 0910-0918 SLP Time Calculation (min) (ACUTE ONLY): 8 min  Assessment / Plan / Recommendation Clinical Impression  Evan [name] seen sitting up in chair, pt had consumed a portion of his breakfast and was not interested in eating much more. He was noted to have slow mastication of solids, needed a liquid wash to fully clear. Though tere were no instances of coughing that appeared to indicate aspiration, pt does have intermittent congested coughing. Vocal quality was clear. Pt reports improvement in the burning pain in his throat that he thought was from medications. Reiterated reflux precautions to his RN as esophageal dysphagia is still a potential area of concern though there has been no real subjective sign of it in sessions so far. Will f/u for tolerance, continue current diet.   HPI HPI: Evan [name] is a 69 y.o. 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 Now with worsening dysphagia over past 24-48 hours.      SLP Plan  Continue with current plan of care       Recommendations  Diet recommendations: Thin liquid;Dysphagia 3 (mechanical soft) Liquids provided via: Cup;Straw Medication Administration: Whole meds with puree (crush large pills) Supervision: Full supervision/cueing for compensatory strategies Compensations: Follow solids with liquid;Slow rate;Small sips/bites                Plan: Continue with current plan of care       GO               Evan [place], MA Evan [name] Evan [name] [phone] Office [phone]  Evan [name] 12/10/2019, 9:33 AM

## 2019-12-11 ENCOUNTER — Inpatient Hospital Stay: Payer: Medicare PPO | Admitting: Primary Care

## 2019-12-11 LAB — BASIC METABOLIC PANEL
Anion gap: 7 (ref 5–15)
BUN: 11 mg/dL (ref 8–23)
CO2: 29 mmol/L (ref 22–32)
Calcium: 8.4 mg/dL — ABNORMAL LOW (ref 8.9–10.3)
Chloride: 104 mmol/L (ref 98–111)
Creatinine, Ser: 0.59 mg/dL — ABNORMAL LOW (ref 0.61–1.24)
GFR, Estimated: >60 mL/min (ref >60)
Glucose, Bld: 147 mg/dL — ABNORMAL HIGH (ref 70–99)
Potassium: 4.1 mmol/L (ref 3.5–5.1)
Sodium: 140 mmol/L (ref 135–145)

## 2019-12-11 LAB — GLUCOSE, CAPILLARY
Glucose-Capillary: 166 mg/dL — ABNORMAL HIGH (ref 70–99)
Glucose-Capillary: 112 mg/dL — ABNORMAL HIGH (ref 70–99)
Glucose-Capillary: 165 mg/dL — ABNORMAL HIGH (ref 70–99)
Glucose-Capillary: 201 mg/dL — ABNORMAL HIGH (ref 70–99)
Glucose-Capillary: 149 mg/dL — ABNORMAL HIGH (ref 70–99)

## 2019-12-11 LAB — CBC WITH DIFFERENTIAL/PLATELET
Abs Immature Granulocytes: 0.06 10*3/uL (ref 0.00–0.07)
Basophils Absolute: 0 10*3/uL (ref 0.0–0.1)
Basophils Relative: 0 %
Eosinophils Absolute: 0.1 10*3/uL (ref 0.0–0.5)
Eosinophils Relative: 1 %
HCT: 33.6 % — ABNORMAL LOW (ref 39.0–52.0)
Hemoglobin: 10.7 g/dL — ABNORMAL LOW (ref 13.0–17.0)
Immature Granulocytes: 1 %
Lymphocytes Relative: 4 %
Lymphs Abs: 0.6 10*3/uL — ABNORMAL LOW (ref 0.7–4.0)
MCH: 28.2 pg (ref 26.0–34.0)
MCHC: 31.8 g/dL (ref 30.0–36.0)
MCV: 88.4 fL (ref 80.0–100.0)
Monocytes Absolute: 0.8 10*3/uL (ref 0.1–1.0)
Monocytes Relative: 6 %
Neutro Abs: 11.4 10*3/uL — ABNORMAL HIGH (ref 1.7–7.7)
Neutrophils Relative %: 88 %
Platelets: 435 10*3/uL — ABNORMAL HIGH (ref 150–400)
RBC: 3.8 MIL/uL — ABNORMAL LOW (ref 4.22–5.81)
RDW: 18.9 % — ABNORMAL HIGH (ref 11.5–15.5)
WBC: 12.9 10*3/uL — ABNORMAL HIGH (ref 4.0–10.5)
nRBC: 0 % (ref 0.0–0.2)

## 2019-12-11 MED ORDER — ASPIRIN EC 81 MG PO TBEC
81.0000 mg | DELAYED_RELEASE_TABLET | Freq: Every day | ORAL | Status: DC
  Administered 2019-12-11 – 2019-12-12 (×2): 81 mg via ORAL
  Filled 2019-12-11 (×2): qty 1

## 2019-12-11 NOTE — Progress Notes (Signed)
PROGRESS NOTE  Evan [name]  DOB: [birth year]  PCP: Patient, No Pcp Per QQI:[id number]  DOA: 11/21/2019  LOS: 20 days   Chief complaint: Confusion, weakness  Brief narrative: Evan [name] is a 69 y.o. 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 Patient presented to the ED on 10/20 with significant confusion, weakness for 24 hours.  He was found to have CVA with carotid stenosis. Carotid stenting was recommended but was delayed because of his poor respiratory status and poor nutritional status.  Core track feeding was started to augment nutrition while pending carotid stenting. Respiratory status subsequently improved and he underwent right carotid stenting on 11/4.  Postprocedure, his hospital course was complicated with low blood pressure, drop in hemoglobin, altered mental status, poor cough reflex, persistent gurgling of secretions and aspiration pneumonia. He has overall improved now and is currently pending placement.  Subjective: Patient was seen and examined this morning. Sitting up in chair. Alert, awake, oriented x3.   On low-flow oxygen by nasal cannula.   Continues to have gurgling throat sound as before.   Has a Foley catheter on.  Hematuria improved.  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   -Imaging showed acute ischemic infarct involving the left temporoccipital region, -LDL 154, A1c 6.3.  Echocardiogram EF 55 to 60%.  CTA head and neck did not show LVO but showed severe bilateral carotid artery stenosis therefore IR consulted for stent placement. -11/4, underwent right carotid artery stenting by neuro IR.   -Because of coexisting DVT, patient was started on heparin drip and Brilinta. However heparin drip had to be held on 11/6 because of hematuria.  Acute respiratory failure with hypoxia Chronic gurgling throat sound History of COPD Stage III lung cancer -During this hospitalization, patient had bilateral diffuse coarse breath sounds from aspiration pneumonia and upper airway secretion.  -He states that for several months, patient is having gurgling sound in his throat.  He reports that Dr. Julien Nordmann, his oncologist suspects it is secondary to scar tissue from cancer.  Patient does not feel any pain or distress related to that. -During this hospitalization, I think his gurgling throat sound has been interpreted multiple times as congestion.  On my evaluation, I have consistently noticed that after he coughs a little bit, sound disappears for a little while.  Patient has not required more than 2 to 3 L of oxygen throughout this stay. -He is currently on a course of oral Augmentin -Continue a tapering course of steroids. -Also continue bronchodilators, scopolamine.  Acute delirium Acute metabolic encephalopathy -Multifactorial: Prolonged hospitalization, stroke, hypoxia. -Mental status is intact now on Seroquel 25 mg twice daily.    Acute DVT of left upper extremity Chronic left IJ thrombosis -CT angio of neck from 10/20 showed chronic findings suspicious for left IJ thrombosis but at the time, patient did not have any swelling of the left upper extremity.   -11/5, patient was noted to have worsening left upper extremity swelling and hence ultrasound duplex was obtained which showed an acute DVT involving the left internal jugular vein, left subclavian vein, left jugular vein, left brachial veins and left basilic vein. -Patient was initiated on heparin drip which unfortunately had to be stopped the next day  because of hematuria. -Instructed to keep left upper extremity elevated. -Swelling  gradually improving.  Acute anemia Hematuria -On 11/5, hemoglobin dropped down to the lowest of 6.6. He got 2 units of PRBC transfusion with subsequent improvement in hemoglobin. CT abdomen pelvis did not show any evidence of retroperitoneal bleed. 11/6, patient had to be started on heparin drip for left upper external DVT but he started having hematuria again.  Heparin drip was stopped.  Brilinta was continued.  Hematuria has improved now.  As recommended by neurology, I would resume aspirin 81 mg daily to continue aspirin and Brilinta for stroke.   Recent Labs    11/27/19 0431 11/28/19 0413 12/01/19 2315 12/04/19 0453 12/06/19 0019 12/07/19 0038 12/08/19 0045 12/09/19 0322 12/10/19 0649 12/11/19 0348  HGB 10.1* 8.7* 8.2* 8.9* 8.4* 6.6* 11.3* 10.7* 10.9* 10.7*   Severe protein calorie malnutrition -10/29 - 11/5 -patient was fed through core track because of poor nutritional status prior to surgery. -Currently on oral feeding.  Mobility: PT evaluation appreciated Code Status:   Code Status: Full Code  Nutritional status: Body mass index is 16.36 kg/m. Nutrition Problem: Severe Malnutrition Etiology: chronic illness, cancer and cancer related treatments Signs/Symptoms: severe muscle depletion, severe fat depletion Percent weight loss: 19.8 % (x7 months) Diet Order            Diet regular Room service appropriate? Yes with Assist; Fluid consistency: Thin  Diet effective now                 DVT prophylaxis: Eliquis  Antimicrobials:  Continue Augmentin Fluid: None Consultants: Neuroradiology Family Communication:  Updated daughter multiple times during his course of auscultation.  Last update 11/6  Status is: Inpatient Remains inpatient because -pending placement.  Dispo: The patient is from: Home              Anticipated d/c is to: SNF recommended by PT              Anticipated d/c date is: 1 to 2 days              Patient currently is not medically stable to  d/c.   Infusions:  . feeding supplement (OSMOLITE 1.2 CAL) 60 mL/hr at 12/06/19 0600    Scheduled Meds: .  stroke: mapping our early stages of recovery book   Does not apply Once  . amoxicillin-clavulanate  1 tablet Oral Q12H  . arformoterol  15 mcg Nebulization BID  . aspirin EC  81 mg Oral Daily  . atorvastatin  80 mg Oral Daily  . budesonide (PULMICORT) nebulizer solution  0.25 mg Nebulization BID  . chlorhexidine  15 mL Mouth/Throat Once  . Chlorhexidine Gluconate Cloth  6 each Topical Daily  . feeding supplement  237 mL Oral TID BM  . feeding supplement (PROSource TF)  45 mL Per Tube Daily  . guaiFENesin  600 mg Oral BID  . multivitamin with minerals  1 tablet Oral Daily  . nicotine  14 mg Transdermal Daily  . pantoprazole  40 mg Oral BID  . prednisoLONE  30 mg Oral Daily  . QUEtiapine  25 mg Oral BID  . revefenacin  175 mcg Nebulization Daily  . scopolamine  1 patch Transdermal Q72H  . tamsulosin  0.4 mg Oral QHS  . ticagrelor  30 mg Oral BID   Or  . ticagrelor  30 mg Per Tube BID    Antimicrobials: Anti-infectives (From admission, onward)   Start     Dose/Rate  Route Frequency Ordered Stop   12/10/19 1415  amoxicillin-clavulanate (AUGMENTIN) 875-125 MG per tablet 1 tablet        1 tablet Oral Every 12 hours 12/10/19 1319     12/07/19 1500  Ampicillin-Sulbactam (UNASYN) 3 g in sodium chloride 0.9 % 100 mL IVPB  Status:  Discontinued        3 g 200 mL/hr over 30 Minutes Intravenous Every 8 hours 12/07/19 1404 12/10/19 1319   11/28/19 0000  ceFAZolin (ANCEF) IVPB 2g/100 mL premix  Status:  Discontinued        2 g 200 mL/hr over 30 Minutes Intravenous 60 min pre-op 11/27/19 1730 11/28/19 0857   11/28/19 0000  ceFAZolin (ANCEF) IVPB 2g/100 mL premix  Status:  Discontinued        2 g 200 mL/hr over 30 Minutes Intravenous 60 min pre-op 11/27/19 1730 11/27/19 1733   11/25/19 1530  Ampicillin-Sulbactam (UNASYN) 3 g in sodium chloride 0.9 % 100 mL IVPB        3 g 200 mL/hr  over 30 Minutes Intravenous Every 8 hours 11/25/19 1432 12/02/19 2359      PRN meds: acetaminophen **OR** acetaminophen (TYLENOL) oral liquid 160 mg/5 mL **OR** acetaminophen, albuterol, hydrOXYzine, LORazepam, senna-docusate   Objective: Vitals:   12/11/19 1100 12/11/19 1241  BP: 104/84   Pulse:    Resp:    Temp: 98.4 F (36.9 C)   SpO2:  90%    Intake/Output Summary (Last 24 hours) at 12/11/2019 1342 Last data filed at 12/11/2019 0900 Gross per 24 hour  Intake 360 ml  Output 895 ml  Net -535 ml   Filed Weights   12/08/19 0500 12/09/19 1330 12/11/19 0500  Weight: 45.2 kg 45.1 kg 44.6 kg   Weight change: -0.5 kg Body mass index is 16.36 kg/m.   Physical Exam: General exam: Appears calm and comfortable. Not in physical distress.  Skin: No rashes, lesions or ulcers.  HEENT: Atraumatic, normocephalic, supple neck, no obvious bleeding.  Core track in place Lungs: Persistent gurgling throat sound, transmitted to chest.  Not in respiratory distress  CVS: Regular rate and rhythm, no murmur GI/Abd soft, nontender, nondistended, bowel sound present CNS: Alert, awake, oriented to place and person.   Psychiatry: Mood appropriate Extremities: Left upper extremities significantly swollen.  Data Review: I have personally reviewed the laboratory data and studies available.  Recent Labs  Lab 12/07/19 0038 12/08/19 0045 12/09/19 0322 12/10/19 0649 12/11/19 0348  WBC 12.3* 18.4* 13.5* 13.1* 12.9*  NEUTROABS 10.7*  --  11.9* 11.4* 11.4*  HGB 6.6* 11.3* 10.7* 10.9* 10.7*  HCT 21.0* 34.2* 32.6* 34.1* 33.6*  MCV 89.0 83.8 85.6 86.3 88.4  PLT 511* 503* 491* 467* 435*   Recent Labs  Lab 12/05/19 0445 12/05/19 0445 12/06/19 0019 12/07/19 0038 12/09/19 0322 12/10/19 0649 12/11/19 0348  NA 133*   < > 130* 135 136 140 140  K 4.9   < > 4.8 3.9 4.0 3.8 4.1  CL 94*   < > 95* 105 102 104 104  CO2 30   < > 25 23 27 27 29   GLUCOSE 133*   < > 165* 107* 109* 114* 147*  BUN 14   <  > 16 13 10 9 11   CREATININE 0.78   < > 0.66 0.48* 0.55* 0.64 0.59*  CALCIUM 8.7*   < > 8.3* 7.0* 8.3* 8.2* 8.4*  MG 2.1  --  2.0  --   --   --   --  PHOS 3.9  --  3.8  --   --   --   --    < > = values in this interval not displayed.    F/u labs ordered.  Signed, Terrilee Croak, MD [facility] 12/11/2019

## 2019-12-11 NOTE — Progress Notes (Signed)
Pt appears to be short of breath. Deep breathing heard from doorway, with rhonchi heard in the lungs. According to RT, a piece of pancake was found in pt's mouth and they are concerned for aspiration. Charge RN, Elmyra Ricks, spoke to MD on the phone and he says that this is the patient's norm. Pt unable to swallow large pills and feels like he is choking when MD entered room. MD educated pt to deep breathe and cough. Lung sounds improved somewhat. MD says pt is medically ready for discharge as soon as a bed is ready.   Justice Rocher, RN

## 2019-12-11 NOTE — TOC Progression Note (Signed)
[facility]) - Pro[facility] Note    Patient Details  Name: DARRIEL UTTER MRN: [id number] Date of Birth: 09-04-1950  Transition [facility] Care [facility]) CM/SW [place], [place] Phone Number: 12/11/2019, 5:17 PM  Clinical Narrative:     Received insurance Josem Kaufmann 11/10-11/12 autth # [id number] reference # [id number]  Thurmond Butts, MSW, LCSWA Clinical Social Worker   Expected Discharge Plan: Skilled Nursing Facility Barriers to Discharge: Insurance Authorization, Continued Medical Work up  Expected Discharge Plan and Services Expected Discharge Plan: [place]                                               Social Determinants of Health (SDOH) Interventions    Readmission Risk Interventions No flowsheet data found.

## 2019-12-11 NOTE — TOC Progression Note (Signed)
Transition of Care [facility]) - Progression Note    Patient Details  Name: KENNARD FILDES MRN: [id number] Date of Birth: Aug 21, 1950  Transition of Care [facility]) CM/SW [place], [place] Phone Number: 12/11/2019, 10:46 AM  Clinical Narrative:     [facility] has confirmed [facility] can admit patient tomorrow.   CSW updated patient's son and daughter- they both expressed [facility] accepting patient was good news-their grandfather is at [facility] too- it makes it easier to care for their family and eliminates some stress.   Thurmond Butts, MSW, LCSWA Clinical Social Worker     Expected Discharge Plan: Skilled Nursing Facility Barriers to Discharge: Ship broker, Continued Medical Work up  Expected Discharge Plan and Services Expected Discharge Plan: [place]                                               Social Determinants of Health (SDOH) Interventions    Readmission Risk Interventions No flowsheet data found.

## 2019-12-11 NOTE — Progress Notes (Signed)
Foley catheter removed from pt and will start voiding trial now, per Dr. Pietro Cassis. Urine in bag appears bloodier than this am.  Justice Rocher, RN

## 2019-12-12 ENCOUNTER — Inpatient Hospital Stay (HOSPITAL_COMMUNITY): Payer: Medicare PPO

## 2019-12-12 DIAGNOSIS — R6520 Severe sepsis without septic shock: Secondary | ICD-10-CM

## 2019-12-12 DIAGNOSIS — J189 Pneumonia, unspecified organism: Secondary | ICD-10-CM | POA: Diagnosis present

## 2019-12-12 DIAGNOSIS — R1310 Dysphagia, unspecified: Secondary | ICD-10-CM

## 2019-12-12 DIAGNOSIS — R1311 Dysphagia, oral phase: Secondary | ICD-10-CM

## 2019-12-12 DIAGNOSIS — I9589 Other hypotension: Secondary | ICD-10-CM

## 2019-12-12 DIAGNOSIS — A419 Sepsis, unspecified organism: Secondary | ICD-10-CM | POA: Diagnosis present

## 2019-12-12 DIAGNOSIS — J9621 Acute and chronic respiratory failure with hypoxia: Secondary | ICD-10-CM

## 2019-12-12 DIAGNOSIS — E7800 Pure hypercholesterolemia, unspecified: Secondary | ICD-10-CM

## 2019-12-12 DIAGNOSIS — S98911A Complete traumatic amputation of right foot, level unspecified, initial encounter: Secondary | ICD-10-CM

## 2019-12-12 DIAGNOSIS — F17200 Nicotine dependence, unspecified, uncomplicated: Secondary | ICD-10-CM

## 2019-12-12 DIAGNOSIS — J431 Panlobular emphysema: Secondary | ICD-10-CM

## 2019-12-12 DIAGNOSIS — C3411 Malignant neoplasm of upper lobe, right bronchus or lung: Secondary | ICD-10-CM

## 2019-12-12 DIAGNOSIS — I6521 Occlusion and stenosis of right carotid artery: Secondary | ICD-10-CM

## 2019-12-12 DIAGNOSIS — I8290 Acute embolism and thrombosis of unspecified vein: Secondary | ICD-10-CM

## 2019-12-12 LAB — BLOOD GAS, ARTERIAL
Acid-Base Excess: 3.9 mmol/L — ABNORMAL HIGH (ref 0.0–2.0)
Bicarbonate: 29 mmol/L — ABNORMAL HIGH (ref 20.0–28.0)
FIO2: 100
O2 Saturation: 97.8 %
Patient temperature: 36.2
pCO2 arterial: 50.5 mmHg — ABNORMAL HIGH (ref 32.0–48.0)
pH, Arterial: 7.372 (ref 7.350–7.450)
pO2, Arterial: 106 mmHg (ref 83.0–108.0)

## 2019-12-12 LAB — CBC WITH DIFFERENTIAL/PLATELET
Abs Immature Granulocytes: 0.16 10*3/uL — ABNORMAL HIGH (ref 0.00–0.07)
Basophils Absolute: 0 10*3/uL (ref 0.0–0.1)
Basophils Relative: 0 %
Eosinophils Absolute: 0.1 10*3/uL (ref 0.0–0.5)
Eosinophils Relative: 1 %
HCT: 39.8 % (ref 39.0–52.0)
Hemoglobin: 12.3 g/dL — ABNORMAL LOW (ref 13.0–17.0)
Immature Granulocytes: 1 %
Lymphocytes Relative: 3 %
Lymphs Abs: 0.7 10*3/uL (ref 0.7–4.0)
MCH: 27.2 pg (ref 26.0–34.0)
MCHC: 30.9 g/dL (ref 30.0–36.0)
MCV: 88.1 fL (ref 80.0–100.0)
Monocytes Absolute: 0.9 10*3/uL (ref 0.1–1.0)
Monocytes Relative: 4 %
Neutro Abs: 19.9 10*3/uL — ABNORMAL HIGH (ref 1.7–7.7)
Neutrophils Relative %: 91 %
Platelets: 441 10*3/uL — ABNORMAL HIGH (ref 150–400)
RBC: 4.52 MIL/uL (ref 4.22–5.81)
RDW: 18.7 % — ABNORMAL HIGH (ref 11.5–15.5)
WBC: 21.7 10*3/uL — ABNORMAL HIGH (ref 4.0–10.5)
nRBC: 0 % (ref 0.0–0.2)

## 2019-12-12 LAB — D-DIMER, QUANTITATIVE: D-Dimer, Quant: >20 ug/mL-FEU — ABNORMAL HIGH (ref 0.00–0.50)

## 2019-12-12 LAB — PROCALCITONIN: Procalcitonin: <0.1 ng/mL

## 2019-12-12 LAB — COMPREHENSIVE METABOLIC PANEL
ALT: 43 U/L (ref 0–44)
AST: 25 U/L (ref 15–41)
Albumin: 2.5 g/dL — ABNORMAL LOW (ref 3.5–5.0)
Alkaline Phosphatase: 102 U/L (ref 38–126)
Anion gap: 11 (ref 5–15)
BUN: 12 mg/dL (ref 8–23)
CO2: 27 mmol/L (ref 22–32)
Calcium: 8.7 mg/dL — ABNORMAL LOW (ref 8.9–10.3)
Chloride: 98 mmol/L (ref 98–111)
Creatinine, Ser: 0.66 mg/dL (ref 0.61–1.24)
GFR, Estimated: >60 mL/min (ref >60)
Glucose, Bld: 142 mg/dL — ABNORMAL HIGH (ref 70–99)
Potassium: 4.1 mmol/L (ref 3.5–5.1)
Sodium: 136 mmol/L (ref 135–145)
Total Bilirubin: 0.6 mg/dL (ref 0.3–1.2)
Total Protein: 5.7 g/dL — ABNORMAL LOW (ref 6.5–8.1)

## 2019-12-12 LAB — LACTIC ACID, PLASMA
Lactic Acid, Venous: 2.2 mmol/L (ref 0.5–1.9)
Lactic Acid, Venous: 2.4 mmol/L (ref 0.5–1.9)

## 2019-12-12 LAB — CBC
HCT: 37.1 % — ABNORMAL LOW (ref 39.0–52.0)
Hemoglobin: 11.6 g/dL — ABNORMAL LOW (ref 13.0–17.0)
MCH: 27.4 pg (ref 26.0–34.0)
MCHC: 31.3 g/dL (ref 30.0–36.0)
MCV: 87.5 fL (ref 80.0–100.0)
Platelets: 427 10*3/uL — ABNORMAL HIGH (ref 150–400)
RBC: 4.24 MIL/uL (ref 4.22–5.81)
RDW: 18.9 % — ABNORMAL HIGH (ref 11.5–15.5)
WBC: 15.7 10*3/uL — ABNORMAL HIGH (ref 4.0–10.5)
nRBC: 0 % (ref 0.0–0.2)

## 2019-12-12 LAB — GLUCOSE, CAPILLARY
Glucose-Capillary: 115 mg/dL — ABNORMAL HIGH (ref 70–99)
Glucose-Capillary: 119 mg/dL — ABNORMAL HIGH (ref 70–99)
Glucose-Capillary: 117 mg/dL — ABNORMAL HIGH (ref 70–99)
Glucose-Capillary: 122 mg/dL — ABNORMAL HIGH (ref 70–99)

## 2019-12-12 LAB — MAGNESIUM: Magnesium: 2 mg/dL (ref 1.7–2.4)

## 2019-12-12 MED ORDER — QUETIAPINE FUMARATE 25 MG PO TABS: 25.0000 mg | ORAL_TABLET | Freq: Two times a day (BID) | ORAL | 0 refills | Status: AC

## 2019-12-12 MED ORDER — NICOTINE 14 MG/24HR TD PT24: 14.0000 mg | MEDICATED_PATCH | Freq: Every day | TRANSDERMAL | 0 refills | Status: AC

## 2019-12-12 MED ORDER — ALBUMIN HUMAN 25 % IV SOLN
25.0000 g | Freq: Once | INTRAVENOUS | Status: AC
  Administered 2019-12-12: 25 g via INTRAVENOUS
  Filled 2019-12-12: qty 100

## 2019-12-12 MED ORDER — PROSOURCE TF PO LIQD: 45.0000 mL | Freq: Every day | ORAL | 0 refills | Status: AC

## 2019-12-12 MED ORDER — TAMSULOSIN HCL 0.4 MG PO CAPS: 0.4000 mg | ORAL_CAPSULE | Freq: Two times a day (BID) | ORAL | 0 refills | Status: AC

## 2019-12-12 MED ORDER — OSMOLITE 1.2 CAL PO LIQD: 1000.0000 mL | ORAL | 0 refills | Status: AC

## 2019-12-12 MED ORDER — SCOPOLAMINE 1 MG/3DAYS TD PT72: 1.0000 | MEDICATED_PATCH | TRANSDERMAL | 0 refills | Status: AC

## 2019-12-12 MED ORDER — MORPHINE SULFATE (PF) 2 MG/ML IV SOLN
2.0000 mg | Freq: Once | INTRAVENOUS | Status: AC
  Administered 2019-12-12: 2 mg via INTRAVENOUS
  Filled 2019-12-12: qty 1

## 2019-12-12 MED ORDER — METHYLPREDNISOLONE SODIUM SUCC 125 MG IJ SOLR
60.0000 mg | Freq: Two times a day (BID) | INTRAMUSCULAR | Status: DC
  Administered 2019-12-12 – 2019-12-13 (×2): 60 mg via INTRAVENOUS
  Filled 2019-12-12 (×2): qty 2

## 2019-12-12 MED ORDER — PREDNISOLONE 5 MG PO TABS: ORAL_TABLET | ORAL | 0 refills | Status: AC

## 2019-12-12 MED ORDER — AMOXICILLIN-POT CLAVULANATE 875-125 MG PO TABS: 1.0000 | ORAL_TABLET | Freq: Two times a day (BID) | ORAL | 0 refills | Status: AC

## 2019-12-12 MED ORDER — PREDNISOLONE 5 MG PO TABS: 20.0000 mg | ORAL_TABLET | Freq: Every day | ORAL | Status: DC

## 2019-12-12 MED ORDER — MORPHINE SULFATE (PF) 2 MG/ML IV SOLN
INTRAVENOUS | Status: AC
  Administered 2019-12-12: 2 mg via INTRAVENOUS
  Filled 2019-12-12: qty 1

## 2019-12-12 MED ORDER — ASPIRIN 81 MG PO TBEC
81.0000 mg | DELAYED_RELEASE_TABLET | Freq: Every day | ORAL | 0 refills | Status: DC
Start: 2019-12-13 — End: 2019-12-16

## 2019-12-12 MED ORDER — MORPHINE SULFATE (PF) 2 MG/ML IV SOLN
2.0000 mg | INTRAVENOUS | Status: DC | PRN
  Administered 2019-12-13: 2 mg via INTRAVENOUS
  Filled 2019-12-12: qty 1

## 2019-12-12 MED ORDER — ARFORMOTEROL TARTRATE 15 MCG/2ML IN NEBU: 15.0000 ug | INHALATION_SOLUTION | Freq: Two times a day (BID) | RESPIRATORY_TRACT | 0 refills | Status: AC

## 2019-12-12 MED ORDER — IPRATROPIUM-ALBUTEROL 0.5-2.5 (3) MG/3ML IN SOLN
3.0000 mL | Freq: Four times a day (QID) | RESPIRATORY_TRACT | Status: DC
  Administered 2019-12-13: 3 mL via RESPIRATORY_TRACT
  Filled 2019-12-12: qty 3

## 2019-12-12 MED ORDER — SODIUM CHLORIDE 0.9 % IV SOLN
2.0000 g | Freq: Two times a day (BID) | INTRAVENOUS | Status: DC
  Administered 2019-12-12 – 2019-12-13 (×2): 2 g via INTRAVENOUS
  Filled 2019-12-12 (×2): qty 2

## 2019-12-12 MED ORDER — HYDROXYZINE HCL 25 MG PO TABS: 25.0000 mg | ORAL_TABLET | Freq: Three times a day (TID) | ORAL | 0 refills | Status: AC | PRN

## 2019-12-12 MED ORDER — HEPARIN (PORCINE) 25000 UT/250ML-% IV SOLN
1000.0000 [IU]/h | INTRAVENOUS | Status: DC
  Administered 2019-12-12: 950 [IU]/h via INTRAVENOUS
  Filled 2019-12-12: qty 250

## 2019-12-12 MED ORDER — ALBUMIN HUMAN 25 % IV SOLN: 12.5000 g | Freq: Once | INTRAVENOUS | Status: DC

## 2019-12-12 MED ORDER — SORBITOL 70 % SOLN
960.0000 mL | TOPICAL_OIL | Freq: Once | ORAL | Status: AC
  Administered 2019-12-12: 960 mL via RECTAL
  Filled 2019-12-12: qty 473

## 2019-12-12 MED ORDER — SODIUM CHLORIDE 0.9 % IV SOLN: INTRAVENOUS | Status: DC

## 2019-12-12 MED ORDER — TAMSULOSIN HCL 0.4 MG PO CAPS: 0.4000 mg | ORAL_CAPSULE | Freq: Two times a day (BID) | ORAL | Status: DC

## 2019-12-12 MED ORDER — PANTOPRAZOLE SODIUM 40 MG PO TBEC: 40.0000 mg | DELAYED_RELEASE_TABLET | Freq: Two times a day (BID) | ORAL | 0 refills | Status: AC

## 2019-12-12 MED ORDER — REVEFENACIN 175 MCG/3ML IN SOLN: 175.0000 ug | Freq: Every day | RESPIRATORY_TRACT | 0 refills | Status: AC

## 2019-12-12 MED ORDER — SENNOSIDES-DOCUSATE SODIUM 8.6-50 MG PO TABS: 1.0000 | ORAL_TABLET | Freq: Every evening | ORAL | 0 refills | Status: AC | PRN

## 2019-12-12 MED ORDER — ATORVASTATIN CALCIUM 80 MG PO TABS: 80.0000 mg | ORAL_TABLET | Freq: Every day | ORAL | 0 refills | Status: AC

## 2019-12-12 MED ORDER — IOHEXOL 350 MG/ML SOLN: 75.0000 mL | Freq: Once | INTRAVENOUS | Status: DC | PRN

## 2019-12-12 MED ORDER — TICAGRELOR 60 MG PO TABS: 30.0000 mg | ORAL_TABLET | Freq: Two times a day (BID) | ORAL | 0 refills | Status: AC

## 2019-12-12 NOTE — Progress Notes (Signed)
CRITICAL VALUE ALERT  Critical Value:  Lactic Acid 2.2  Date & Time Notied:  12/12/19 1650  Provider Notified: Dr. Sherral Hammers  Orders Received/Actions taken: See new orders

## 2019-12-12 NOTE — Progress Notes (Addendum)
Pt scheduled for chest CT. This RN went with pt. While downstairs on table, IV started leaking and then came out so CT wasn't able to be completed. Pt then got distressed and had foam around mouth, saturations going down in the 80s. 02 turned up, pt gotten back to bed and brought back up to room. Pt settled in room and placed back on nonrebreather. Saturations increased to 90's and up to 100%. Bp taken and improved as well. Report given to [facility], RN for nightshift that pt will need to go back to CT once he gets new IV and is more stable. Recommend giving PRN IV Ativan prior to CT.   Justice Rocher, RN

## 2019-12-12 NOTE — Progress Notes (Signed)
Patient refused NTS. Attempted Florin at 6L.  Per RN patient anxious and preparing for CT trip. Placed back on NRB.

## 2019-12-12 NOTE — Progress Notes (Signed)
Pt had iv team come and place site for CT testing.  IV working fine on floor and when pt arrived at CT, site not working.  Second iv placed and second attempt at CT with no success.  IV team states that pt is not a candidate for midline due to renal mass and would need a central line placed for future attempts.  RN paged on call to notify.

## 2019-12-12 NOTE — Progress Notes (Addendum)
Paged triad because pharmacy called wanting to start heparin gtt and pt has new blood in foley line.  Heparin on hold for now until clarification from on call. Will continue to monitor patient.

## 2019-12-12 NOTE — Progress Notes (Signed)
Dr. Minda Ditto called back and was notified of pink tinged urine and confirmed that the patient should still receive heparin gtt due to benefit vs risk theory.

## 2019-12-12 NOTE — Significant Event (Signed)
Rapid Response Event Note   Reason for Call :  SpO2 60% on 2LNC, HR 160 bpm. Pt complaining of having a panic attack  Initial Focused Assessment:  Pt lying in bed, awake. Lung sounds are rhonchus throughout. Skin is warm, dry. Pt is tachypneic with increased work of breathing. RT at bedside to administer PRN albuterol treatment.   VS: BP 124/103, HR 125, RR 21, SpO2 99% on 100% NRB  Interventions:  -CXR -ABG: 7.37/ 50.5/ 106/ 29 -Labs: CMP, Mg, La, Procal, CBC w/ diff  Plan of Care:  -Follow up with provider results of tests ordered -Wean oxygen as pt tolerates -PRN morphine ordered -Aspiration precautions  Call rapid response for additional needs Event Summary:  MD Notified: Dr. Sherral Hammers to bedside to evaluate Call Time: [place] Time: 1408 End Time: 2426 - rapid response RN called to another emergency  Casimer Bilis, RN

## 2019-12-12 NOTE — Progress Notes (Addendum)
Pt called out that he was having a panic attack. When this RN entered the room pt's 02 saturations in the 60's with a good waveform and tachycardia up to the 160's. 02 slowly turned from 2 to 10L and still sating in the 60s-70s. Charge RN, Elmyra Ricks, entered room and called RT and then rapid response. RT suggested use of non-rebreather. Nicole hooked up nonrebreather at 15L, and this RN pulled PRN Atarax. Pt's sats immediately improved up to 100% on nonrebreather. Atarax given. RT at bedside and gave PRN breathing tx. Rapid response, Shelby, at bedside, suggested chest xray and temp check. Dr. Sherral Hammers contacted and quickly came to bedside. Ordered STAT chest xray, ABG, CMP, and mag. Will also make pt NPO, per order. Social work informed of event, as pt was supposed to go to [employer] today.  During event, pt given 2 mg Morphine two times, per verbal orders from Dr. Sherral Hammers. See additional orders.  Justice Rocher, RN

## 2019-12-12 NOTE — Discharge Summary (Deleted)
Physician Discharge Summary  Evan [name] QPY:[id number] DOB: [birth year] DOA: 11/21/2019  PCP: Patient, No Pcp Per  Admit date: 11/21/2019 Discharge date: 12/12/2019  Time spent: 66 minutes  Recommendations for Outpatient Follow-up:   Acute nonhemorrhagic CVA, left temporal occipital region/Bilateral carotid artery stenosis -Presented with confusion and weakness.  -Imaging showed acute ischemic infarct involving the left temporoccipital region, -LDL 154, A1c 6.3. Echocardiogram EF 55 to 60%. CTA head and neck did not show LVO but showed severe bilateral carotid artery stenosis therefore IR consulted for stent placement. -11/4, underwent right carotid artery stenting by neuro IR.  -Because of coexisting DVT, patient was started on heparin drip and Brilinta. However heparin drip had to be held on 11/6 because of hematuria. -11/10 hematuria resolved patient now on Eliquis  Acute on chronic respiratory failure with hypoxia/COPD/stage III lung cancer -Per patient on 2 L O2 at home prior to admission -11/10 currently on 2 l O2 which is his home regimen. -Patient and son understand patient is to schedule follow-up with PCCM when it gets close to time for release from SNF -Patient and son understand patient is to schedule follow-up with Dr. Julien Nordmann oncology when he gets close time for release from SNF  Chronic gurgling throat sound -During this hospitalization, patient had bilateral diffuse coarse breath sounds from aspiration pneumonia and upper airway secretion.  -He states that for several months, patient is having gurgling sound in his throat. He reports that Dr. Julien Nordmann, his oncologist suspects it is secondary to scar tissue from cancer. Patient does not feel any pain or distress related to that. -Patient has not required more than 2 to 3 L of oxygen throughout this stay. -Titrate O2 to maintain SPO2> 88% -Complete course of Augmentin last dose 11/15 -Continue a tapering course of  steroids. -Also continue bronchodilators, scopolamine.   Acute delirium/Acute metabolic encephalopathy -Multifactorial: Prolonged hospitalization, stroke, hypoxia. -11/10 resolved patient A/O x4 -Seroquel 25 mg twice daily.   Acute DVT of left upper extremity/Chronic left IJ thrombosis -CT angio of neck from 10/20 showed chronic findings suspicious for left IJ thrombosis but at the time, patient did not have any swelling of the left upper extremity.  -11/5, patient was noted to have worsening left upper extremity swelling and hence ultrasound duplex was obtained which showed an acute DVT involving the left internal jugular vein, left subclavian vein, left jugular vein, left brachial veins and left basilic vein. -Patient has been started on heparin drip but discontinued secondary to acute blood loss anemia and hematuria. -See goals of care  Acute blood loss anemia/Hematuria -On11/5, hemoglobin dropped down to the lowest of 6.6.  -11/5 transfuse 2 units PRBC  -CT abdomen pelvis did not show any evidence of retroperitoneal bleed. 11/6, patienthad to be started on heparin drip for left upper external DVT but he started having hematuria again.  -Heparin drip was stopped. Brilinta was continued. Hematuria has improved now. As recommended by neurology, I would resume aspirin 81 mg daily to continue aspirin and Brilinta for stroke.  Lab Results  Component Value Date   HGB 11.6 (L) 12/12/2019   HGB 10.7 (L) 12/11/2019   HGB 10.9 (L) 12/10/2019   HGB 10.7 (L) 12/09/2019   HGB 11.3 (L) 12/08/2019   Severe protein calorie malnutrition -10/29 - 11/5 -patient was fed through core track because of poor nutritional status prior to surgery. -Currently on oral feeding.  Goals of care -Spoke at length with patient, son, and daughter on the phone.  Explained that  patient was at significant risk for future PE, DVT given that he had cancer and a current DVT.  Stated that we could keep patient  overnight and restart anticoagulation and determine if he has acute bleed again, if he does not would discharge on anticoagulant.  Patient stated he understood that he could develop a clot which could be terminal but that he wanted to be discharged to SNF today.  Patient's children stated they would honor their father's wishes.    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[facility])   DNR (do not resuscitate) discussion   Bilateral carotid artery stenosis   Acute respiratory distress   Palliative care by specialist   Stenosis of right carotid artery   Weakness generalized   Stenosis of artery ([place])   Discharge Condition: Guarded  Diet recommendation: Regular  Filed Weights   12/09/19 1330 12/11/19 0500 12/12/19 0500  Weight: 45.1 kg 44.6 kg 43.8 kg    History of present illness:  69 y.o.maleBM PMHx lung cancer apparently stage IIIa s/p Chemo now in remission, recently hospitalized a week prior to ICU with recurrent pneumonia, COPD/chronic respiratory failure with hypoxia on 2 L O2 at home, depression, left renal mass, anxiety disorder, 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  Patient presented to the ED on 10/20 with significant confusion, weakness for 24 hours.  He was found to have CVA with carotid stenosis. Carotid stenting was recommended but was delayed because of his poor respiratory status and poor nutritional status. Core track feeding was started to augment nutrition while pending carotid stenting. Respiratory status subsequently improved and he underwent right carotid stenting on 11/4.  Postprocedure, his hospital course was complicated with low blood pressure, drop in hemoglobin, altered mental  status, poor cough reflex, persistent gurgling of secretions and aspiration pneumonia.He has overall improved now and is currently pending placement.   Hospital Course:  See above  Procedures: 11/5 transfuse 2 units PRBC    Cultures   11/8 SARS coronavirus negative 11/8 Influenza A/B negative   Antibiotics Anti-infectives (From admission, onward)   Start     Ordered Stop   12/10/19 1415  amoxicillin-clavulanate (AUGMENTIN) 875-125 MG per tablet 1 tablet        12/10/19 1319     12/07/19 1500  Ampicillin-Sulbactam (UNASYN) 3 g in sodium chloride 0.9 % 100 mL IVPB  Status:  Discontinued        12/07/19 1404 12/10/19 1319   11/28/19 0000  ceFAZolin (ANCEF) IVPB 2g/100 mL premix  Status:  Discontinued        11/27/19 1730 11/28/19 0857   11/28/19 0000  ceFAZolin (ANCEF) IVPB 2g/100 mL premix  Status:  Discontinued        11/27/19 1730 11/27/19 1733   11/25/19 1530  Ampicillin-Sulbactam (UNASYN) 3 g in sodium chloride 0.9 % 100 mL IVPB        11/25/19 1432 12/02/19 2359       Discharge Exam: Vitals:   12/12/19 0759 12/12/19 0800 12/12/19 0801 12/12/19 0839  BP:    108/79  Pulse:    92  Resp:    19  Temp:    97.9 F (36.6 C)  TempSrc:    Oral  SpO2: 96% 96%  96% 97%  Weight:      Height:       Physical Exam:  General: A/O x4, positive chronic respiratory distress, cachectic Eyes: negative scleral hemorrhage, negative anisocoria, negative icterus ENT: Negative Runny nose, negative gingival bleeding, Neck:  Negative scars, masses, torticollis, lymphadenopathy, JVD Lungs: diffuse rhonchi Cardiovascular: Regular rate and rhythm without murmur gallop or rub normal S1 and S2   Discharge Instructions  Discharge Instructions    Amb Referral to Nutrition and Diabetic E   Complete by: As directed      Allergies as of 12/12/2019      Reactions   Chantix [varenicline] Other (See Comments)   Weird dreams      Medication List    STOP taking these medications    Bevespi Aerosphere 9-4.8 MCG/ACT Aero Generic drug: Glycopyrrolate-Formoterol   nicotine polacrilex 4 MG gum Commonly known as: NICORETTE   predniSONE 20 MG tablet Commonly known as: DELTASONE     TAKE these medications   acetaminophen 500 MG tablet Commonly known as: TYLENOL Take 500 mg by mouth every 6 (six) hours as needed for mild pain, fever or headache.   albuterol 108 (90 Base) MCG/ACT inhaler Commonly known as: VENTOLIN HFA TAKE 2 PUFFS BY MOUTH EVERY 6 HOURS AS NEEDED FOR WHEEZE OR SHORTNESS OF BREATH What changed: See the new instructions.   amoxicillin-clavulanate 875-125 MG tablet Commonly known as: AUGMENTIN Take 1 tablet by mouth every 12 (twelve) hours.   arformoterol 15 MCG/2ML Nebu Commonly known as: BROVANA Take 2 mLs (15 mcg total) by nebulization 2 (two) times daily.   aspirin 81 MG EC tablet Take 1 tablet (81 mg total) by mouth daily. Swallow whole. Start taking on: December 13, 2019 What changed:   medication strength  how much to take  additional instructions   atorvastatin 80 MG tablet Commonly known as: LIPITOR Take 1 tablet (80 mg total) by mouth daily. Start taking on: December 13, 2019   benzonatate 100 MG capsule Commonly known as: TESSALON Take 1 capsule (100 mg total) by mouth every 8 (eight) hours.   budesonide 0.5 MG/2ML nebulizer solution Commonly known as: PULMICORT TAKE 2 MLS (0.5 MG TOTAL) BY NEBULIZATION 2 (TWO) TIMES DAILY.   feeding supplement (OSMOLITE 1.2 CAL) Liqd Place 1,000 mLs into feeding tube continuous.   feeding supplement (PROSource TF) liquid Place 45 mLs into feeding tube daily. Start taking on: December 13, 2019   guaiFENesin 600 MG 12 hr tablet Commonly known as: MUCINEX Take 600 mg by mouth 2 (two) times daily as needed for cough. What changed: Another medication with the same name was removed. Continue taking this medication, and follow the directions you see here.   hydrOXYzine 25 MG  tablet Commonly known as: ATARAX/VISTARIL Take 1 tablet (25 mg total) by mouth 3 (three) times daily as needed for anxiety.   ipratropium-albuterol 0.5-2.5 (3) MG/3ML Soln Commonly known as: DUONEB Take 3 mLs by nebulization every 6 (six) hours as needed. What changed: reasons to take this   nicotine 14 mg/24hr patch Commonly known as: NICODERM CQ - dosed in mg/24 hours Place 1 patch (14 mg total) onto the skin daily. Start taking on: December 13, 2019 What changed: additional instructions   pantoprazole 40 MG tablet Commonly known as: PROTONIX Take 1 tablet (40 mg total) by mouth 2 (two) times daily.   prednisoLONE 5 MG Tabs tablet 2 tablets by mouth daily 11/11--- 11/15.  Then 11/16--- 11/21 tablet by mouth daily.  Then stop  QUEtiapine 25 MG tablet Commonly known as: SEROQUEL Take 1 tablet (25 mg total) by mouth 2 (two) times daily.   revefenacin 175 MCG/3ML nebulizer solution Commonly known as: YUPELRI Take 3 mLs (175 mcg total) by nebulization daily. Start taking on: December 13, 2019   scopolamine 1 MG/3DAYS Commonly known as: TRANSDERM-SCOP Place 1 patch (1.5 mg total) onto the skin every 3 (three) days. Start taking on: December 14, 2019   senna-docusate 8.6-50 MG tablet Commonly known as: Senokot-S Take 1 tablet by mouth at bedtime as needed for moderate constipation.   tamsulosin 0.4 MG Caps capsule Commonly known as: FLOMAX Take 1 capsule (0.4 mg total) by mouth 2 (two) times daily. What changed: when to take this   ticagrelor 60 MG Tabs tablet Commonly known as: BRILINTA Take 0.5 tablets (30 mg total) by mouth 2 (two) times daily.      Allergies  Allergen Reactions  . Chantix [Varenicline] Other (See Comments)    Weird dreams    Follow-up Information    Deveshwar, Willaim Rayas, MD Follow up in 4 week(s).   Specialties: Interventional Radiology, Radiology Why: Please follow-up with Dr. Estanislado Pandy in clinic 4 weeks after discharge. Our office will call  you to set up this appointment. Contact information: [place] [address] [phone]                The results of significant diagnostics from this hospitalization (including imaging, microbiology, ancillary and laboratory) are listed below for reference.    Significant Diagnostic Studies: CT ABDOMEN PELVIS WO CONTRAST  Result Date: 12/07/2019 CLINICAL DATA:  69 year old male status post bilateral femoral approach carotid stenting 12/06/2019 with decreasing hemoglobin overnight. Assess for retroperitoneal hemorrhage. EXAM: CT ABDOMEN AND PELVIS WITHOUT CONTRAST TECHNIQUE: Multidetector CT imaging of the abdomen and pelvis was performed following the standard protocol without IV contrast. COMPARISON:  CT abdomen/pelvis 10/28/2019; PET-CT 03/30/2019 FINDINGS: Lower chest: Chronic elevation of the left hemidiaphragm patchy ground-glass attenuation airspace opacities in the dependent portions of both the right and left lower lobe distributed in a peribronchovascular distribution. Diffuse mild bronchial wall thickening. Trace left pleural effusion. The visualized cardiac structures are within normal limits for size. Hepatobiliary: No focal liver abnormality is seen. No gallstones, gallbladder wall thickening, or biliary dilatation. Pancreas: Unremarkable. No pancreatic ductal dilatation or surrounding inflammatory changes. Spleen: Normal in size without focal abnormality. Adrenals/Urinary Tract: Limited evaluation in the absence of intravenous contrast. No hydronephrosis or nephrolithiasis. Normal adrenal glands. A Foley catheter is present within the bladder. Stomach/Bowel: Stomach is within normal limits. Appendix appears normal. No evidence of bowel wall thickening, distention, or inflammatory changes. Gastric tube present with the tip in the stomach. Vascular/Lymphatic: Limited evaluation in the absence of intravenous contrast. Atherosclerotic calcifications present along the  abdominal aorta and branch vessels. Progressive left para-aortic lymphadenopathy. Index lymph node measures up to 1.1 cm in short axis (image 43 series 3) compared to 0.7 cm previously. Reproductive: Massive prostatomegaly. The prostate gland measures 6.5 x 6.6 x 6.7 cm (volume = 150 cm^3). Other: No evidence of groin or retroperitoneal hematoma. Musculoskeletal: No acute fracture or aggressive appearing lytic or blastic osseous lesion. IMPRESSION: 1. No evidence of groin or retroperitoneal hemorrhage. 2. Ground-glass attenuation airspace opacities and bronchial wall thickening in the dependent portions of both lower lungs. Imaging findings are suggestive of aspiration. 3. Progressive left para-aortic lymphadenopathy compared to prior imaging from 10/28/2019 and 03/30/2019. Given known history of non-small cell lung cancer, these findings are concerning for  metastatic lymphadenopathy. Additional considerations include lymphoma. Consider further evaluation with PET-CT. 4. Marked prostatomegaly. 5. Additional ancillary findings as above. Electronically Signed   By: Jacqulynn Cadet M.D.   On: 12/07/2019 13:44   CT Code Stroke CTA Head W/WO contrast  Addendum Date: 11/21/2019   ADDENDUM REPORT: 11/21/2019 22:59 ADDENDUM: Study discussed by telephone with Dr. Vanita Panda in the ED on 11/21/2019 at 2252 hours. Electronically Signed   By: Genevie Ann M.D.   On: 11/21/2019 22:59   Result Date: 11/21/2019 CLINICAL DATA:  68 year old male with confusion, left upper extremity weakness. Plain head CT raising the possibility of small left parietal infarct. History of lung cancer EXAM: CT ANGIOGRAPHY HEAD AND NECK TECHNIQUE: Multidetector CT imaging of the head and neck was performed using the standard protocol during bolus administration of intravenous contrast. Multiplanar CT image reconstructions and MIPs were obtained to evaluate the vascular anatomy. Carotid stenosis measurements (when applicable) are obtained utilizing  NASCET criteria, using the distal internal carotid diameter as the denominator. CONTRAST:  54m OMNIPAQUE IOHEXOL 350 MG/ML SOLN COMPARISON:  Plain head CT 1650 hours today. CTA head and neck 02/16/2017. Recent CTA chest 11/16/2019. FINDINGS: CTA NECK Skeleton: Stable visualized osseous structures. Degenerative appearing heterogeneous sclerosis in the cervical spine. 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 Continued patchy opacity in the visible superior segment of the left lower lobe (series 6, image 157) from CTA 5 days ago. Other neck: Limited by extensive paravertebral and other bilateral neck venous contrast reflux. No neck mass or lymphadenopathy is evident. Aortic arch: Stable aortic arch since 2019 with mild atherosclerosis. Three vessel arch configuration. Right carotid system: Patent right CCA origin but high-grade right CCA stenosis in the lower neck at the level of of of the thyroid over a segment of about 14 mm, see series 9, image 158. Radiographic string sign stenosis on series 6, image 132 this is chronic but progressed since 2019. The right CCA remains patent. Negative right carotid bifurcation. Right ICA is patent without significant plaque or stenosis. Left carotid system: Patent left CCA origin but similar abrupt radiographic string sign stenosis of the left CCA in the lower neck (series 6, image 135 and series 9, image 148 over a segment of about 15 mm. Despite this the left CCA remains patent. This stenosis has not significantly changed since 2019. Negative left carotid bifurcation.  No cervical left ICA stenosis. Vertebral arteries: Limited cervical vertebral artery detail related to abundant paravertebral venous contrast, especially on the right where the vertebral is non dominant and diminutive. The right vertebral does remain patent to the skull base. Proximal left subclavian artery and  left vertebral origin are within normal limits. Tortuous left V1. Dominant left vertebral artery is patent to the skull base without visible stenosis. CTA HEAD Posterior circulation: Dominant left vertebral primarily supplies the basilar. Patent left PICA origin. Diminutive right vertebral artery is patent to the vertebrobasilar junction with patent PICA origin, stable since 2019. Patent basilar artery without stenosis. Patent SCA and PCA origins. Posterior communicating arteries are diminutive or absent. Bilateral PCA branches are within normal limits. Anterior circulation: Both ICA siphons are patent. There is moderate supraclinoid segment atherosclerosis on the left resulting in mild supraclinoid stenosis. Cavernous and supraclinoid plaque and irregularity on the right also with mild stenosis. Patent carotid termini. Patent MCA and ACA origins. Dominant right ACA A1 as before. Anterior communicating artery and bilateral ACA branches are stable and  within normal limits. Left MCA M1 segment and bifurcation are patent without stenosis. Left MCA branches are stable and within normal limits. Right MCA M1 segment and bifurcation are patent without stenosis. Right MCA branches are stable and within normal limits. Venous sinuses: Intracranial venous structures are patent. Left IJ venous reflux extends into the head as far as the torcula. Left upper extremity contrast injection with extensive paravertebral and other bilateral neck venous contrast reflux. 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 In particular, there is a suspicious pattern of contrast reflux into the left IJ, with the round central filling defect within the vein (series 6, image 100) which seems to abate at the angle of the mandible. Anatomic variants: Dominant left vertebral artery primarily supplies the basilar. Dominant right ACA A1. Review of the MIP images confirms the above findings IMPRESSION: 1. Negative for large vessel  occlusion. 2. 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 Bilateral CCA RADIOGRAPHIC STRING SIGNS, stable on the left but progressed on the right since a 2019 CTA. Recommend Vascular Surgery consultation. 3. 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 Left neck Venous Doppler Ultrasound should be confirmatory. 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 4. No carotid bifurcation plaque or stenosis. Bilateral ICA siphon plaque with only mild stenosis. Otherwise intracranial anterior and posterior circulation within normal limits. 5. 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 Continued abnormal opacity in the superior segment of the left lower lobe from the Chest CTA 5 days ago. Electronically Signed: By: Genevie Ann M.D. On: 11/21/2019 22:42   DG Abd 1 View  Result Date: 11/30/2019 CLINICAL DATA:  Feeding tube placement EXAM: ABDOMEN - 1 VIEW COMPARISON:  None. FINDINGS: Feeding tube passes through the stomach into the duodenum. Feeding tube tip in the second portion duodenum. Water-soluble contrast injected into the tube filling the duodenum and stomach. IMPRESSION: Feeding tube tip second portion of duodenum. Electronically Signed   By: Franchot Gallo M.D.   On: 11/30/2019 14:50   CT HEAD WO CONTRAST  Result Date: 11/21/2019 CLINICAL DATA:  Nystagmus.  Left upper arm weakness.  Confusion. EXAM: CT HEAD WITHOUT CONTRAST TECHNIQUE: Contiguous axial images were obtained from the base of the skull through the vertex without intravenous contrast. COMPARISON:  10/28/2019 FINDINGS: Brain: Area of low-density noted in the left parietal region  not seen on prior study compatible with acute to subacute infarction. No additional acute intracranial abnormality. No hemorrhage or hydrocephalus. Vascular: No hyperdense vessel or unexpected calcification. Skull: No acute calvarial abnormality. Sinuses/Orbits: No acute findings Other: None IMPRESSION: Small area of low-density in the left parietal lobe compatible with acute to subacute infarction. Electronically Signed   By: Rolm Baptise M.D.   On: 11/21/2019 18:41   CT Code Stroke CTA Neck W/WO contrast  Addendum Date: 11/21/2019   ADDENDUM REPORT: 11/21/2019 22:59 ADDENDUM: Study discussed by telephone with Dr. Vanita Panda in the ED on 11/21/2019 at 2252 hours. Electronically Signed   By: Genevie Ann M.D.   On: 11/21/2019 22:59   Result Date: 11/21/2019 CLINICAL DATA:  69 year old male with confusion, left upper extremity weakness. Plain head CT raising the possibility of small  left parietal infarct. History of lung cancer EXAM: CT ANGIOGRAPHY HEAD AND NECK TECHNIQUE: Multidetector CT imaging of the head and neck was performed using the standard protocol during bolus administration of intravenous contrast. Multiplanar CT image reconstructions and MIPs were obtained to evaluate the vascular anatomy. Carotid stenosis measurements (when applicable) are obtained utilizing NASCET criteria, using the distal internal carotid diameter as the denominator. CONTRAST:  61m OMNIPAQUE IOHEXOL 350 MG/ML SOLN COMPARISON:  Plain head CT 1650 hours today. CTA head and neck 02/16/2017. Recent CTA chest 11/16/2019. FINDINGS: CTA NECK Skeleton: Stable visualized osseous structures. Degenerative appearing heterogeneous sclerosis in the cervical spine. 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 Continued patchy opacity in the visible superior segment of the left lower lobe (series 6, image 157) from CTA 5 days ago. Other neck:  Limited by extensive paravertebral and other bilateral neck venous contrast reflux. No neck mass or lymphadenopathy is evident. Aortic arch: Stable aortic arch since 2019 with mild atherosclerosis. Three vessel arch configuration. Right carotid system: Patent right CCA origin but high-grade right CCA stenosis in the lower neck at the level of of of the thyroid over a segment of about 14 mm, see series 9, image 158. Radiographic string sign stenosis on series 6, image 132 this is chronic but progressed since 2019. The right CCA remains patent. Negative right carotid bifurcation. Right ICA is patent without significant plaque or stenosis. Left carotid system: Patent left CCA origin but similar abrupt radiographic string sign stenosis of the left CCA in the lower neck (series 6, image 135 and series 9, image 148 over a segment of about 15 mm. Despite this the left CCA remains patent. This stenosis has not significantly changed since 2019. Negative left carotid bifurcation.  No cervical left ICA stenosis. Vertebral arteries: Limited cervical vertebral artery detail related to abundant paravertebral venous contrast, especially on the right where the vertebral is non dominant and diminutive. The right vertebral does remain patent to the skull base. Proximal left subclavian artery and left vertebral origin are within normal limits. Tortuous left V1. Dominant left vertebral artery is patent to the skull base without visible stenosis. CTA HEAD Posterior circulation: Dominant left vertebral primarily supplies the basilar. Patent left PICA origin. Diminutive right vertebral artery is patent to the vertebrobasilar junction with patent PICA origin, stable since 2019. Patent basilar artery without stenosis. Patent SCA and PCA origins. Posterior communicating arteries are diminutive or absent. Bilateral PCA branches are within normal limits. Anterior circulation: Both ICA siphons are patent. There is moderate supraclinoid segment  atherosclerosis on the left resulting in mild supraclinoid stenosis. Cavernous and supraclinoid plaque and irregularity on the right also with mild stenosis. Patent carotid termini. Patent MCA and ACA origins. Dominant right ACA A1 as before. Anterior communicating artery and bilateral ACA branches are stable and within normal limits. Left MCA M1 segment and bifurcation are patent without stenosis. Left MCA branches are stable and within normal limits. Right MCA M1 segment and bifurcation are patent without stenosis. Right MCA branches are stable and within normal limits. Venous sinuses: Intracranial venous structures are patent. Left IJ venous reflux extends into the head as far as the torcula. Left upper extremity contrast injection with extensive paravertebral and other bilateral neck venous contrast reflux. 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 In particular, there is a suspicious pattern of contrast reflux into the left IJ, with the round central filling defect  within the vein (series 6, image 100) which seems to abate at the angle of the mandible. Anatomic variants: Dominant left vertebral artery primarily supplies the basilar. Dominant right ACA A1. Review of the MIP images confirms the above findings IMPRESSION: 1. Negative for large vessel occlusion. 2. 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 Bilateral CCA RADIOGRAPHIC STRING SIGNS, stable on the left but progressed on the right since a 2019 CTA. Recommend Vascular Surgery consultation. 3. 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 Left neck Venous Doppler Ultrasound should be confirmatory. 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 4. No carotid bifurcation plaque or stenosis. Bilateral ICA siphon plaque with only mild stenosis. Otherwise intracranial anterior and posterior circulation within normal limits. 5. 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 Continued abnormal opacity in the superior segment of the left lower lobe from the Chest CTA 5 days ago. Electronically Signed: By: Genevie Ann M.D. On: 11/21/2019 22:42   CT ANGIO CHEST PE W OR WO CONTRAST  Result Date: 11/16/2019 CLINICAL DATA:  Shortness of breath EXAM: CT ANGIOGRAPHY CHEST WITH CONTRAST TECHNIQUE: Multidetector CT imaging of the chest was performed using the standard protocol during bolus administration of intravenous contrast. Multiplanar CT image reconstructions and MIPs were obtained to evaluate the vascular anatomy. CONTRAST:  166m OMNIPAQUE IOHEXOL 350 MG/ML SOLN COMPARISON:  Chest x-ray from earlier in the same day, CT from 10/28/2019. FINDINGS: Cardiovascular: Thoracic aorta demonstrates atherosclerotic calcification without aneurysmal dilatation. No cardiac enlargement is seen. The pulmonary artery shows a normal branching pattern bilaterally. Some attenuation of the left upper lobe arterial branches is seen related to post radiation changes stable from the prior exam. Similar changes are noted in the upper lobe branches on the right related to post radiation change. No definitive filling defects to suggest pulmonary embolism are noted. No coronary calcifications are seen. Thoracic inlet is within normal limits. Mediastinum/Nodes: Thoracic inlet is within normal limits. There is again noted increased soft tissue density along the mediastinum bilaterally related to post radiation change. The hilar fullness seen on the right on the prior exam is less prominent on today's study. The esophagus is within normal limits. Lungs/Pleura: Lungs are well aerated bilaterally. Stable scarring  in the lungs is noted along the mediastinum consistent with post radiation change. Persistent airspace change in the left lower lobe superior segment is noted similar changes are noted in the right lower lobe laterally and stable. These may also represent areas of scarring given their long-term stability. No sizable effusion or acute infiltrate is seen. Emphysematous changes are again seen. Upper Abdomen: Visualized upper abdomen is within normal limits. Musculoskeletal: No acute bony abnormality is seen. Review of the MIP images confirms the above findings. IMPRESSION: No evidence of pulmonary emboli. Overall stable appearance of scarring in the paramediastinal regions bilaterally. This attenuates some of the pulmonary arterial branches although no emboli are seen. Patchy airspace disease in the superior segment of the left lower lobe as well as the right lower lobe stable from the prior exam. Persistent infiltrate could not be totally excluded although these changes may represent scarring. Aortic Atherosclerosis (ICD10-I70.0) and Emphysema (ICD10-J43.9). Electronically Signed   By: MElta Guadeloupe  Lukens M.D.   On: 11/16/2019 18:02   MR ANGIO HEAD WO CONTRAST  Result Date: 11/22/2019 CLINICAL DATA:  Stroke, follow-up. Brain mass or lesion, history of small cell lung cancer, rule out metastases. EXAM: MRI HEAD WITH CONTRAST MRA HEAD WITHOUT CONTRAST TECHNIQUE: Multiplanar, multiecho pulse sequences of the brain and surrounding structures were obtained without and with intravenous contrast. Angiographic images of the head were obtained using MRA technique without contrast. CONTRAST:  30m GADAVIST GADOBUTROL 1 MMOL/ML IV SOLN COMPARISON:  Noncontrast head CT 11/21/2019, noncontrast brain MRI 11/21/2019, CT angiogram head/neck 11/21/2019. Brain MRI 08/29/2018. FINDINGS: MRI HEAD FINDINGS The examination is markedly motion degraded with severe or moderate/severe motion degradation of all post-contrast sequences. This  precludes adequate evaluation for intracranial metastatic disease. There is predominantly gyriform enhancement within the left temporal occipital lobes (series 9, image 8). No other definite sites of abnormal intracranial enhancement are identified. MRA HEAD FINDINGS The examination is moderate to severely motion degraded. This precludes adequate evaluation for intracranial arterial stenoses and limits evaluation for aneurysms. No intracranial large vessel occlusion is identified. Faint flow related signal is seen arising from the intracranial right vertebral artery. In correlating with the prior CTA of 11/21/2019, this vessel is developmentally diminutive. IMPRESSION: MRI brain: 1. Markedly motion degraded examination with severe or moderate/severe motion degradation of all acquired post-contrast sequences. This precludes adequate evaluation for intracranial metastatic disease, and a repeat examination is recommended when the patient is better able to tolerate study. 2. Predominantly gyriform enhancement within the left temporal occipital lobes. This may be related to the known acute/early subacute infarct at this site. However, attention is recommended on follow-up. 3. No other definite sites of abnormal intracranial enhancement are identified. MRA head: 1. Moderate to severely motion degraded examination, precluding adequate evaluation for intracranial arterial stenoses and limiting evaluation for aneurysms. 2. No intracranial large vessel occlusion is identified. Electronically Signed   By: KKellie SimmeringDO   On: 11/22/2019 07:59   MR BRAIN WO CONTRAST  Result Date: 11/21/2019 CLINICAL DATA:  Initial evaluation for neuro deficit, stroke suspected. EXAM: MRI HEAD WITHOUT CONTRAST TECHNIQUE: Multiplanar, multiecho pulse sequences of the brain and surrounding structures were obtained without intravenous contrast. COMPARISON:  Prior CT from earlier the same day. FINDINGS: Brain: Examination degraded by motion  artifact. Cerebral volume within normal limits for age. Single tiny remote left cerebellar infarct noted. Patchy and confluent area of restricted diffusion seen involving the left temporal occipital region, consistent with an acute ischemic infarct (series 5, image 84). Associated minimal petechial hemorrhage without hemorrhagic transformation or significant mass effect. Additional scattered predominantly subcentimeter cortical and subcortical ischemic infarcts seen involving the frontal and parietal lobes bilaterally, somewhat linear in watershed in distribution (series 5, images 98, 92, 89). No associated hemorrhage or mass effect. No infratentorial ischemic changes. No other foci of susceptibility artifact to suggest acute or chronic intracranial hemorrhage. No mass lesion, midline shift or mass effect. No hydrocephalus or extra-axial fluid collection. Pituitary gland suprasellar region within normal limits. Midline structures intact. Vascular: Major intracranial vascular flow voids are grossly maintained. Skull and upper cervical spine: Craniocervical junction within normal limits. Bone marrow signal intensity normal. No scalp soft tissue abnormality. Sinuses/Orbits: Globes and orbital soft tissues within normal limits. Mild mucosal thickening noted within the maxillary sinuses. Paranasal sinuses are otherwise clear. No mastoid effusion. Other: None. IMPRESSION: 1. Acute ischemic infarct involving the left temporoccipital region, corresponding with abnormality on prior CT, and consistent with an acute ischemic infarct. Additional  scattered cortical and subcortical infarcts involving the frontal and parietal lobes bilaterally, watershed in distribution. Constellation of findings suggest a recent hypotensive episode/hypoperfusion injury. Associated minimal petechial hemorrhage at the left temporoccipital infarct. No other associated hemorrhage or mass effect. 2. Underlying tiny remote left cerebellar infarct.  Otherwise negative brain MRI for age. Electronically Signed   By: Jeannine Boga M.D.   On: 11/21/2019 20:16   MR BRAIN W CONTRAST  Result Date: 11/22/2019 CLINICAL DATA:  Stroke, follow-up. Brain mass or lesion, history of small cell lung cancer, rule out metastases. EXAM: MRI HEAD WITH CONTRAST MRA HEAD WITHOUT CONTRAST TECHNIQUE: Multiplanar, multiecho pulse sequences of the brain and surrounding structures were obtained without and with intravenous contrast. Angiographic images of the head were obtained using MRA technique without contrast. CONTRAST:  57m GADAVIST GADOBUTROL 1 MMOL/ML IV SOLN COMPARISON:  Noncontrast head CT 11/21/2019, noncontrast brain MRI 11/21/2019, CT angiogram head/neck 11/21/2019. Brain MRI 08/29/2018. FINDINGS: MRI HEAD FINDINGS The examination is markedly motion degraded with severe or moderate/severe motion degradation of all post-contrast sequences. This precludes adequate evaluation for intracranial metastatic disease. There is predominantly gyriform enhancement within the left temporal occipital lobes (series 9, image 8). No other definite sites of abnormal intracranial enhancement are identified. MRA HEAD FINDINGS The examination is moderate to severely motion degraded. This precludes adequate evaluation for intracranial arterial stenoses and limits evaluation for aneurysms. No intracranial large vessel occlusion is identified. Faint flow related signal is seen arising from the intracranial right vertebral artery. In correlating with the prior CTA of 11/21/2019, this vessel is developmentally diminutive. IMPRESSION: MRI brain: 1. Markedly motion degraded examination with severe or moderate/severe motion degradation of all acquired post-contrast sequences. This precludes adequate evaluation for intracranial metastatic disease, and a repeat examination is recommended when the patient is better able to tolerate study. 2. Predominantly gyriform enhancement within the left  temporal occipital lobes. This may be related to the known acute/early subacute infarct at this site. However, attention is recommended on follow-up. 3. No other definite sites of abnormal intracranial enhancement are identified. MRA head: 1. Moderate to severely motion degraded examination, precluding adequate evaluation for intracranial arterial stenoses and limiting evaluation for aneurysms. 2. No intracranial large vessel occlusion is identified. Electronically Signed   By: KKellie SimmeringDO   On: 11/22/2019 07:59   IR INTRAVSC STENT CERV CAROTID W/O EMB-PROT MOD SED  Result Date: 12/07/2019 CLINICAL DATA:  Patient with right cerebral hemisphere stroke secondary to severe right common carotid artery stenosis. Also history of left common carotid artery stenosis. EXAM: INTRAVSC STENT CERV CAROTID W/O EMB-PROT COMPARISON:  Diagnostic catheter arteriogram of November 23, 2019. MEDICATIONS: Heparin 3,500 units IV. Ancef 2 g IV antibiotic was administered within 1 hour of the procedure. ANESTHESIA/SEDATION: Mac anesthesia as per anesthesia service. CONTRAST:  Isovue 300 approximately 120 mL FLUOROSCOPY TIME:  Fluoroscopy Time: 25 minutes 42 seconds (5579 mGy). COMPLICATIONS: None immediate. TECHNIQUE: Informed written consent was obtained from the patient after a thorough discussion of the procedural risks, benefits and alternatives. All questions were addressed. Maximal Sterile Barrier Technique was utilized including caps, mask, sterile gowns, sterile gloves, sterile drape, hand hygiene and skin antiseptic. A timeout was performed prior to the initiation of the procedure. The left groin was prepped and draped in the usual sterile fashion. Thereafter using modified Seldinger technique, transfemoral access into the left common femoral artery was obtained without difficulty. Over a 0.035 inch guidewire, a 5 French Pinnacle sheath was inserted. Through this, and also over  0.035 inch guidewire, a 5 [place] JB 1 catheter  was advanced to the aortic arch region and selectively positioned in the innominate artery, and the right common carotid artery. FINDINGS: The innominate arteriogram demonstrates the origin of the right subclavian artery to be widely patent. The right common carotid artery in its proximal 1/3 is widely patent. Distal to this there is severe smooth tapered narrowing with more distal right common carotid artery proximal to the bifurcation to be widely patent. The right internal carotid artery at the bulb to the cranial skull base is also widely patent. Partial opacification of the right external carotid artery origin is seen. More distally the right internal carotid artery is seen to opacify widely to the cranial skull base. The petrous, the cavernous and the supraclinoid segments are widely patent. The right middle cerebral artery and the right anterior cerebral artery opacify into the capillary and venous phases. Prompt opacification via the anterior communicating artery of the left anterior cerebral A2 segment and distally is seen. ENDOVASCULAR REVASCULARIZATION OF SYMPTOMATIC SEVERE RIGHT COMMON CAROTID ARTERY STENOSIS WITH STENT ASSISTED ANGIOPLASTY The 0.035 inch Roadrunner guidewire was then gently advanced without any difficulty to the right common carotid bifurcation followed by the JB 1 catheter. The wire was removed. Good aspiration obtained from the hub of the 5 French diagnostic catheter. A gentle control arteriogram through this demonstrates safe positioning of the tip of the diagnostic catheter. The diagnostic catheter was then exchanged for an 014 inch 300 cm BMW micro guidewire. The micro guidewire tip was engaged in the right external carotid artery. The 5 French Pinnacle sheath was then replaced with an 8 [place] short Pinnacle sheath. This was then connected to continuous heparinized saline infusion. Over the exchange micro guidewire, an 80 cm 6 French Cook shuttle sheath was advanced and positioned  just proximal to the severe right common carotid stenosis. A gentle control arteriogram performed through this demonstrated safe position of the tip of the [employer] sheath. Measurements were performed of the right common carotid artery distal to the severe stenosis and proximally. It was decided to proceed with placement of a 6/8 x 40 mm Xact stent. This was prepped and purged with heparinized saline infusion in its housing. Using the rapid exchange technique, the delivery system of the stent was advanced without difficulty and positioned distal and proximally in the chosen positions. Once there, the stent was deployed in the usual manner without any difficulty. The delivery system was then retrieved and removed. A control arteriogram performed through the [facility] shuttle sheath in the proximal right common carotid artery demonstrated excellent apposition distally and proximally with the presence of a waist in the mid segment. This prompted the advancement of a 5 mm x 30 mm Viatrac 14 balloon which had been prepped and purged with 50% contrast and 50% heparinized saline infusion. Again this was using the rapid exchange technique. The proximal and the distal markers were positioned adequate distant from the site of the intra stent stenosis. Control angioplasty was then performed using the micro inflation syringe device via micro tubing. Balloon was inflated to 10 atmospheres where it was maintained for approximately 10 seconds deflated and retrieved and removed. During this time the patient did not exhibit any change in his neurological status. The balloon was retrieved and removed. A control arteriogram performed medially and subsequently at 50 minutes later continued to demonstrate excellent flow through the stented segment without evidence of intraluminal filling defects. More distally, no evidence of intracranial filling  defects or of occlusion was seen. There was an appreciably improved hemodynamic flow into  the intracranial compartment with brisk flow into the mid MCA and ACA distributions and also into the contralateral left anterior cerebral artery. Under constant fluoroscopic guidance, the exchange micro guidewire was removed. A final control arteriogram performed through the [facility] shuttle sheath in the right common carotid artery demonstrated continued improved flow extra cranially and intracranially. The [facility] shuttle sheath was removed. The 8 French Pinnacle sheath was removed with manual compression used to perform hemostasis over about 30 minutes. Distal pulses on the left side remained Dopplerable unchanged. Neurologically the patient was stable. On pronation there was improved power in the left upper extremity with excellent hand grip, and dorsal and plantar flexion of the left foot. The patient denied any headaches, nausea, vomiting or visual changes. He was then transferred to the neuro ICU for post revascularization management. IMPRESSION: Status post endovascular revascularization of symptomatic severe right common carotid artery stenosis with stent assisted angioplasty. PLAN: Follow-up in the clinic 2 to 3 weeks post discharge. Electronically Signed   By: Luanne Bras M.D.   On: 12/06/2019 17:24   IR US Guide Vasc Access Right  Result Date: 11/26/2019 CLINICAL DATA:  Left-sided weakness arm greater than leg. CT angiogram of the head and neck demonstrating high-grade stenosis of the common carotid arteries bilaterally. EXAM: BILATERAL COMMON CAROTID AND INNOMINATE ANGIOGRAPHY COMPARISON:  CT angiogram of the head and neck of November 30, 2019. MEDICATIONS: Heparin 2000 units IA. No antibiotic was administered within 1 hour of the procedure. ANESTHESIA/SEDATION: Versed 0.5 mg IV; Fentanyl 12.5 mcg IV Moderate Sedation Time:  35 minutes The patient was continuously monitored during the procedure by the interventional radiology nurse under my direct supervision. CONTRAST:  Omnipaque 300 approximately  65 mL FLUOROSCOPY TIME:  Fluoroscopy Time: 10 minutes 36 seconds (530 mGy). COMPLICATIONS: None immediate. TECHNIQUE: Informed written consent was obtained from the patient after a thorough discussion of the procedural risks, benefits and alternatives. All questions were addressed. Maximal Sterile Barrier Technique was utilized including caps, mask, sterile gowns, sterile gloves, sterile drape, hand hygiene and skin antiseptic. A timeout was performed prior to the initiation of the procedure. The right forearm to the wrist was prepped and draped in the usual sterile manner. The right radial artery was then identified with ultrasound, and its morphology documented. A dorsal palmar anastomosis was verified to be present. Using ultrasound guidance, radial access was obtained using a micropuncture set over a 0.018 inch micro guidewire. A 4/5 French radial sheath was then inserted. The micro guidewire, and the obturator were removed. Good aspiration obtained from the side port of the radial sheath. A cocktail of 1000 units of heparin, and 200 mcg of nitroglycerin was then infused through the radial sheath without event. A radial arteriogram was performed. Over a 0.035 inch Roadrunner guidewire, a Simmons 2 diagnostic catheter was then advanced to the aortic arch region, and selectively positioned in the right common carotid artery, the left common carotid artery, the left vertebral artery and the right subclavian artery. Following the procedure, hemostasis at the right radial puncture site was achieved with a wrist band. Distal right radial pulse was verified to be present. FINDINGS: The right common carotid arteriogram demonstrates segmental high-grade stenosis of 90% of the right common carotid artery at the junction of the middle and proximal 1/3 is seen. Distal to this opacification of the right common carotid artery and the right internal carotid artery is seen. There  is a mild stenosis at the origin of the right  internal carotid artery. More distally the petrous, the cavernous and the supraclinoid segments are widely patent. The right middle cerebral artery and the right anterior cerebral artery opacify into the capillary and venous phases. Cross-filling via the anterior communicating artery of the left anterior cerebral A2 segment and distally is seen. No opacification of the right external carotid artery at its origin is seen. The right subclavian arteriogram demonstrates hypoplastic right vertebral artery with flow seen to the cranial skull base. Retrograde opacification of the right external carotid artery territory is seen via the ipsilateral right occipital artery. The left common carotid arteriogram demonstrates severe segmental stenosis of 90% plus at the junction of the middle and proximal third of the left common carotid artery. More distally post stenotic dilatation is seen. Patency is seen of the left internal carotid artery at the bulb to the cranial skull base. The petrous, the cavernous and the supraclinoid segments are widely patent. The left middle cerebral artery and the left anterior cerebral artery opacify into the capillary and venous phases. Non opacification of the left external carotid artery is seen. The dominant left vertebral artery origin is widely patent. The vessel opacifies to the cranial skull base. Patency is seen of the left vertebrobasilar junction, and the left posterior-inferior cerebellar artery. There is retrograde opacification of the left external carotid artery distribution via multiple musculoskeletal collaterals arising from the distal left vertebral artery. More distally the left vertebrobasilar junction, the basilar artery, the posterior cerebral arteries, the superior cerebellar arteries and the anterior-inferior cerebellar arteries is seen into the capillary and venous phases. Retrograde opacification of the right vertebrobasilar junction proximal to the right  posterior-inferior cerebellar artery is seen. Partial retrograde opacification via the left posterior communicating artery of the left middle cerebral artery distribution is seen. 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 PLAN: Findings reviewed with the patient and referring neurologist. Given the symptomatic nature of the right common carotid artery stenosis, endovascular revascularization scheduled for early next week. Electronically Signed   By: Luanne Bras M.D.   On: 11/23/2019 16:25   DG CHEST PORT 1 VIEW  Result Date: 12/07/2019 CLINICAL DATA:  Dyspnea. EXAM: PORTABLE CHEST 1 VIEW COMPARISON:  December 06, 2019. FINDINGS: The heart size and mediastinal contours are within normal limits. Feeding tube is seen entering stomach. No pneumothorax or pleural effusion is noted. Left lung is clear. Ill-defined nodular opacity seen in right midlung is again noted and may represent focal inflammation. The visualized skeletal structures are unremarkable. IMPRESSION: Ill-defined nodular opacity seen in right midlung is again noted and may represent focal inflammation. Electronically Signed   By: Marijo Conception M.D.   On: 12/07/2019 08:40   DG Chest Port 1 View  Result Date: 12/06/2019 CLINICAL DATA:  Fluid overload.  Hypotension. EXAM: PORTABLE CHEST 1 VIEW COMPARISON:  Radiograph 11/28/2019.  CT 11/16/2019. FINDINGS: Enteric tube in place with tip and side-port below the diaphragm in the stomach. The heart is normal in size. Stable mediastinal contours with right hilar prominence corresponding to ill-defined soft tissue on CT. Widening of the upper mediastinal contours corresponds to post radiation change. Vascular stent in the region of  the right carotid. Chronic volume loss in the  left hemithorax. Slight increase in interstitial markings which may represent pulmonary edema. No significant pleural effusion. No pneumothorax. Nonspecific vague nodular opacity in the right mid lung. The bones are under mineralized. IMPRESSION: 1. Enteric tube in place with tip and side-port below the diaphragm in the stomach. 2. Slight increase in interstitial markings which may represent pulmonary edema given history of fluid overload. 3. Nonspecific ill-defined nodular opacity in the right mid lung. 4. Stable radiographic appearance of post treatment related changes. Electronically Signed   By: Keith Rake M.D.   On: 12/06/2019 17:15   DG Chest Port 1 View  Result Date: 11/28/2019 CLINICAL DATA:  Dyspnea EXAM: PORTABLE CHEST 1 VIEW COMPARISON:  Prior chest x-ray 11/25/2019 FINDINGS: Stable chronic atelectasis of the right upper lobe resulting in hyperinflation of the right lower and right middle lobes. Similar appearance of mild patchy airspace opacities in the right lung base. No significant interval progression. Cardiac and mediastinal contours remain unchanged. Chronic bronchitic changes are unchanged. No pleural effusion or pneumothorax. No acute osseous abnormality. IMPRESSION: Stable chest x-ray without significant interval change compared to 11/25/2019. Electronically Signed   By: Jacqulynn Cadet M.D.   On: 11/28/2019 09:08   DG CHEST PORT 1 VIEW  Result Date: 11/25/2019 CLINICAL DATA:  Pneumonia, history of lung cancer. EXAM: PORTABLE CHEST 1 VIEW COMPARISON:  November 22, 2019. FINDINGS: Stable cardiomediastinal silhouette. No pneumothorax or pleural effusion is noted. Left lung is clear. Slightly increased right basilar opacity is noted concerning for possible atelectasis or infiltrate. Stable bilateral hilar findings consistent with retraction or fibrosis. Bony thorax is unremarkable. IMPRESSION: Slightly increased right basilar opacity is  noted concerning for possible atelectasis or infiltrate. Electronically Signed   By: Marijo Conception M.D.   On: 11/25/2019 12:24   DG CHEST PORT 1 VIEW  Result Date: 11/22/2019 CLINICAL DATA:  Dyspnea. EXAM: PORTABLE CHEST 1 VIEW COMPARISON:  November 21, 2019. FINDINGS: The heart size and mediastinal contours are within normal limits. No pneumothorax or pleural effusion is noted. Stable findings consistent with bilateral hilar retraction. Right apical pleural thickening is noted which is unchanged. No acute pulmonary disease is noted. The visualized skeletal structures are unremarkable. IMPRESSION: No active disease. Electronically Signed   By: Marijo Conception M.D.   On: 11/22/2019 19:57   DG Chest Port 1 View  Result Date: 11/21/2019 CLINICAL DATA:  Shortness of breath EXAM: PORTABLE CHEST 1 VIEW COMPARISON:  11/16/2019, CT 11/16/2019, PET CT 03/30/2019 FINDINGS: No acute consolidation or effusion. Bilateral hilar retraction with bilateral upper paramediastinal opacity without change. Stable cardiac size. No pneumothorax IMPRESSION: No active disease. Similar appearance of bilateral hilar retraction and upper paramediastinal opacity. Electronically Signed   By: Donavan Foil M.D.   On: 11/21/2019 20:54   DG Chest Port 1 View  Result Date: 11/16/2019 CLINICAL DATA:  Lung cancer.  Acute exacerbation COPD. EXAM: PORTABLE CHEST 1 VIEW COMPARISON:  CTA chest 10/28/2019 FINDINGS: Heart size is normal. Superior mediastinal soft tissue is again noted. Peripheral nodular opacity in the right lung is increasing in size. No edema is present. No effusions are present. IMPRESSION: Increasing size of peripheral nodular opacity in the right lung. Stable soft tissue the superior mediastinum. No acute cardiopulmonary disease. Electronically Signed   By: San Morelle M.D.   On: 11/16/2019 06:44   DG Swallowing Func-Speech Pathology  Result Date: 11/25/2019 Objective Swallowing Evaluation: Type of Study:  MBS-Modified Barium Swallow Study  Patient Details Name: Evan [name] MRN: [id number] Date  of Birth: March 09, 1950 Today's Date: 11/25/2019 Time: SLP Start Time (ACUTE ONLY): 1100 -SLP Stop Time (ACUTE ONLY): 1130 SLP Time Calculation (min) (ACUTE ONLY): 30 min Past Medical History: Past Medical History: Diagnosis Date . Anxiety  . Complication of anesthesia   " sometimes I wake up during surgery " . Depression  . Left renal mass 07/19/2016 . Lung cancer ([place]) dx'd 2010   NSCL CA right . Lung cancer ([place]) dx'd 2017  left Past Surgical History: Past Surgical History: Procedure Laterality Date . AMPUTATION Right 05/21/2012  Procedure: AMPUTATION BELOW KNEE;  Surgeon: Newt Minion, MD;  Location: [facility];  Service: Orthopedics;  Laterality: Right; . AMPUTATION Right 06/21/2012  Procedure: Revision AMPUTATION BELOW KNEE Right;  Surgeon: Newt Minion, MD;  Location: [facility];  Service: Orthopedics;  Laterality: Right;  Revision right Below Knee Amputation . COLONOSCOPY W/ BIOPSIES AND POLYPECTOMY   . FOOT AMPUTATION Right   traumatic right lower extremity . UPPER JAW    SURGERY FOR INFECTION HPI: Evan [name] is a 69 y.o. 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 Now with worsening dysphagia over past 24-48 hours.  Subjective: alert but not very interactive Assessment / Plan / Recommendation CHL IP CLINICAL IMPRESSIONS 11/25/2019 Clinical Impression Patient presents with a moderate oropharyngeal dysphagia without aspiration but with incident of very trace penetration during swallow with thin liquids. Oral phase is characterized by weak oral control and  movement of boluses, mastication was decreased and resulted in prolonged oral phase with regular solids. He exhibited premature spillage of thin and nectar liquids, delay in swallow initiation to level of vallecular sinus with puree solids and pyriform sinus with thin liquids. Inital swallows of puree solids, regular solids resulted in moderate vallecular residuals and mild pharyngeal residuals. Patient independently initiated swallow which helped clear residuals and sips of thin liquids helped clear residuals in pharynx to trace to minimal in amount. Overall, patient's airway is well protected during swallow with all tested consistencies and he is safe to initiate oral diet at at this time. SLP Visit Diagnosis Dysphagia, oropharyngeal phase (R13.12) Attention and concentration deficit following -- Frontal lobe and executive function deficit following -- Impact on safety and function Mild aspiration risk   CHL IP TREATMENT RECOMMENDATION 11/25/2019 Treatment Recommendations Therapy as outlined in treatment plan below   Prognosis 11/25/2019 Prognosis for Safe Diet Advancement Good Barriers to Reach Goals -- Barriers/Prognosis Comment -- CHL IP DIET RECOMMENDATION 11/25/2019 SLP Diet Recommendations Thin liquid;Dysphagia 1 (Puree) solids Liquid Administration via Cup;Straw Medication Administration Whole meds with puree Compensations Minimize environmental distractions;Slow rate;Small sips/bites;Follow solids with liquid Postural Changes Remain semi-upright after after feeds/meals (Comment);Seated upright at 90 degrees   CHL IP OTHER RECOMMENDATIONS 11/25/2019 Recommended Consults -- Oral Care Recommendations Oral care BID Other Recommendations --   CHL IP FOLLOW UP RECOMMENDATIONS 11/25/2019 Follow up Recommendations 24 hour supervision/assistance;Skilled Nursing facility;Home health SLP   CHL IP FREQUENCY AND DURATION 11/25/2019 Speech Therapy Frequency (ACUTE ONLY) min 2x/week Treatment Duration --      CHL IP ORAL  PHASE 11/25/2019 Oral Phase Impaired Oral - Pudding Teaspoon -- Oral - Pudding Cup -- Oral - Honey Teaspoon -- Oral - Honey Cup -- Oral -  Nectar Teaspoon -- Oral - Nectar Cup Weak lingual manipulation;Premature spillage Oral - Nectar Straw -- Oral - Thin Teaspoon -- Oral - Thin Cup Premature spillage Oral - Thin Straw Premature spillage Oral - Puree Premature spillage Oral - Mech Soft -- Oral - Regular Delayed oral transit;Weak lingual manipulation Oral - Multi-Consistency -- Oral - Pill Impaired mastication;Weak lingual manipulation;Delayed oral transit Oral Phase - Comment --  CHL IP PHARYNGEAL PHASE 11/25/2019 Pharyngeal Phase Impaired Pharyngeal- Pudding Teaspoon -- Pharyngeal -- Pharyngeal- Pudding Cup -- Pharyngeal -- Pharyngeal- Honey Teaspoon -- Pharyngeal -- Pharyngeal- Honey Cup -- Pharyngeal -- Pharyngeal- Nectar Teaspoon -- Pharyngeal -- Pharyngeal- Nectar Cup Delayed swallow initiation-vallecula;Pharyngeal residue - valleculae;Pharyngeal residue - pyriform Pharyngeal -- Pharyngeal- Nectar Straw -- Pharyngeal -- Pharyngeal- Thin Teaspoon -- Pharyngeal -- Pharyngeal- Thin Cup Delayed swallow initiation-vallecula;Delayed swallow initiation-pyriform sinuses;Penetration/Aspiration during swallow;Pharyngeal residue - valleculae;Pharyngeal residue - pyriform Pharyngeal Material enters airway, remains ABOVE vocal cords then ejected out Pharyngeal- Thin Straw Delayed swallow initiation-pyriform sinuses;Delayed swallow initiation-vallecula Pharyngeal -- Pharyngeal- Puree Delayed swallow initiation-vallecula;Pharyngeal residue - valleculae Pharyngeal -- Pharyngeal- Mechanical Soft -- Pharyngeal -- Pharyngeal- Regular Pharyngeal residue - valleculae Pharyngeal -- Pharyngeal- Multi-consistency -- Pharyngeal -- Pharyngeal- Pill WFL Pharyngeal -- Pharyngeal Comment --  CHL IP CERVICAL ESOPHAGEAL PHASE 11/25/2019 Cervical Esophageal Phase WFL Pudding Teaspoon -- Pudding Cup -- Honey Teaspoon -- Honey Cup -- Nectar  Teaspoon -- Nectar Cup -- Nectar Straw -- Thin Teaspoon -- Thin Cup -- Thin Straw -- Puree -- Mechanical Soft -- Regular -- Multi-consistency -- Pill -- Cervical Esophageal Comment -- Sonia Baller, MA, CCC-SLP Speech Therapy [facility]             ECHOCARDIOGRAM COMPLETE  Result Date: 11/22/2019    ECHOCARDIOGRAM REPORT   Patient Name:   Evan [name] Date of Exam: 11/22/2019 Medical Rec #:  [id number]       Height:       65.0 in Accession #:    [id number]      Weight:       106.0 lb Date of Birth:  July 24, 1950       BSA:          1.510 m Patient Age:    39 years        BP:           117/76 mmHg Patient Gender: M               HR:           106 bpm. Exam Location:  Inpatient Procedure: 2D Echo, Cardiac Doppler and Color Doppler Indications:    Stroke 434.91 / I163.9  History:        Patient has no prior history of Echocardiogram examinations.                 COPD; Risk Factors:Current Smoker and Dyslipidemia.  Sonographer:    Bernadene Person RDCS Referring Phys: [place]  1. Left ventricular ejection fraction, by estimation, is 55 to 60%. The left ventricle has normal function. The left ventricle has no regional wall motion abnormalities. Left ventricular diastolic parameters are consistent with Grade I diastolic dysfunction (impaired relaxation). Elevated left atrial pressure.  2. Right ventricular systolic function is normal. The right ventricular size is normal.  3. The mitral valve is normal in structure. Trivial mitral valve regurgitation. No evidence of mitral stenosis.  4. The aortic valve is normal in structure. Aortic valve regurgitation is not visualized. No aortic  stenosis is present.  5. The inferior vena cava is normal in size with greater than 50% respiratory variability, suggesting right atrial pressure of 3 mmHg. Comparison(s): No prior Echocardiogram. FINDINGS  Left Ventricle: Left ventricular ejection fraction, by estimation, is 55 to 60%. The left ventricle has  normal function. The left ventricle has no regional wall motion abnormalities. The left ventricular internal cavity size was normal in size. There is  no left ventricular hypertrophy. Left ventricular diastolic parameters are consistent with Grade I diastolic dysfunction (impaired relaxation). Elevated left atrial pressure. Right Ventricle: The right ventricular size is normal. No increase in right ventricular wall thickness. Right ventricular systolic function is normal. Left Atrium: Left atrial size was normal in size. Right Atrium: Right atrial size was normal in size. Pericardium: There is no evidence of pericardial effusion. Mitral Valve: The mitral valve is normal in structure. Trivial mitral valve regurgitation. No evidence of mitral valve stenosis. Tricuspid Valve: The tricuspid valve is normal in structure. Tricuspid valve regurgitation is trivial. No evidence of tricuspid stenosis. Aortic Valve: The aortic valve is normal in structure. Aortic valve regurgitation is not visualized. No aortic stenosis is present. Pulmonic Valve: The pulmonic valve was normal in structure. Pulmonic valve regurgitation is not visualized. No evidence of pulmonic stenosis. Aorta: The aortic root is normal in size and structure. Venous: The inferior vena cava is normal in size with greater than 50% respiratory variability, suggesting right atrial pressure of 3 mmHg. IAS/Shunts: No atrial level shunt detected by color flow Doppler.  LEFT VENTRICLE PLAX 2D LVIDd:         3.70 cm  Diastology LVIDs:         2.10 cm  LV e' medial:    3.92 cm/s LV PW:         0.80 cm  LV E/e' medial:  14.5 LV IVS:        0.80 cm  LV e' lateral:   9.67 cm/s LVOT diam:     2.10 cm  LV E/e' lateral: 5.9 LV SV:         68 LV SV Index:   45 LVOT Area:     3.46 cm  RIGHT VENTRICLE RV S prime:     12.00 cm/s TAPSE (M-mode): 1.9 cm LEFT ATRIUM             Index      RIGHT ATRIUM          Index LA diam:        1.90 cm 1.26 cm/m RA Area:     7.97 cm LA Vol  (A2C):   12.0 ml 7.95 ml/m RA Volume:   14.60 ml 9.67 ml/m LA Vol (A4C):   12.9 ml 8.54 ml/m LA Biplane Vol: 12.6 ml 8.34 ml/m  AORTIC VALVE LVOT Vmax:   108.00 cm/s LVOT Vmean:  70.900 cm/s LVOT VTI:    0.196 m  AORTA Ao Root diam: 3.50 cm MITRAL VALVE MV Area (PHT): 2.37 cm    SHUNTS MV Decel Time: 320 msec    Systemic VTI:  0.20 m MV E velocity: 56.90 cm/s  Systemic Diam: 2.10 cm MV A velocity: 67.20 cm/s MV E/A ratio:  0.85 Ena Dawley MD Electronically signed by Ena Dawley MD Signature Date/Time: 11/22/2019/6:53:37 PM    Final    VAS [place] UPPER EXTREMITY VENOUS DUPLEX  Result Date: 12/07/2019 UPPER VENOUS STUDY  Indications: Swelling, Edema, and History of LUE DVT on 11/22/19 Other Indications: Status post right common carotid  artery stenting on 12/06/2019. Risk Factors: Cancer Lung COPD, pneumonia. Comparison Study: 11/22/19 - LUE venous. Performing Technologist: Oda Cogan RDMS, RVT  Examination Guidelines: A complete evaluation includes B-mode imaging, spectral Doppler, color Doppler, and power Doppler as needed of all accessible portions of each vessel. Bilateral testing is considered an integral part of a complete examination. Limited examinations for reoccurring indications may be performed as noted.  Right Findings: +----------+------------+---------+-----------+----------+-------+ RIGHT     CompressiblePhasicitySpontaneousPropertiesSummary +----------+------------+---------+-----------+----------+-------+ Subclavian               Yes       Yes                      +----------+------------+---------+-----------+----------+-------+  Left Findings: +----------+------------+---------+-----------+----------+-------+ LEFT      CompressiblePhasicitySpontaneousPropertiesSummary +----------+------------+---------+-----------+----------+-------+ IJV           None       No        No                Acute   +----------+------------+---------+-----------+----------+-------+ Subclavian    None       No        No                Acute  +----------+------------+---------+-----------+----------+-------+ Axillary      None       No        No                Acute  +----------+------------+---------+-----------+----------+-------+ Brachial    Partial                                  Acute  +----------+------------+---------+-----------+----------+-------+ Radial        Full                                          +----------+------------+---------+-----------+----------+-------+ Ulnar         Full                                          +----------+------------+---------+-----------+----------+-------+ Cephalic      Full                                          +----------+------------+---------+-----------+----------+-------+ Basilic       None                                   Acute  +----------+------------+---------+-----------+----------+-------+  Summary:  Right: No evidence of thrombosis in the subclavian.  Left: Findings consistent with acute deep vein thrombosis involving the left internal jugular vein, left subclavian vein, left axillary vein and left brachial veins. Findings consistent with acute superficial vein thrombosis involving the left basilic vein. Findings for deep vein thrombosis appear unchanged from previous study.  *See table(s) above for measurements and observations.  Diagnosing physician: Servando Snare MD Electronically signed by Servando Snare MD on 12/07/2019 at 4:03:30 PM.    Final    VAS [place] UPPER EXTREMITY VENOUS DUPLEX  Result Date: 11/22/2019 UPPER VENOUS  STUDY  Indications: Swelling, and Pain Risk Factors: Cancer Lung. Performing Technologist: Griffin Basil RCT RDMS  Examination Guidelines: A complete evaluation includes B-mode imaging, spectral Doppler, color Doppler, and power Doppler as needed of all accessible portions of each vessel.  Bilateral testing is considered an integral part of a complete examination. Limited examinations for reoccurring indications may be performed as noted.  Left Findings: +----------+------------+---------+-----------+----------+-------+ LEFT      CompressiblePhasicitySpontaneousPropertiesSummary +----------+------------+---------+-----------+----------+-------+ IJV           None       No        No                       +----------+------------+---------+-----------+----------+-------+ Subclavian    None       No        No                       +----------+------------+---------+-----------+----------+-------+ Axillary      None       No        No                       +----------+------------+---------+-----------+----------+-------+ Brachial    Partial      Yes       Yes                      +----------+------------+---------+-----------+----------+-------+ Radial        Full                                          +----------+------------+---------+-----------+----------+-------+ Ulnar         Full                                          +----------+------------+---------+-----------+----------+-------+ Cephalic      Full                                          +----------+------------+---------+-----------+----------+-------+ Basilic       Full                                          +----------+------------+---------+-----------+----------+-------+  Summary:  Right: No evidence of thrombosis in the subclavian.  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 Previous Cat Scan Yesterday.  *See table(s) above for measurements and observations.  Diagnosing physician: Servando Snare MD Electronically signed by Servando Snare MD on 11/22/2019 at 4:11:26 PM.    Final    IR ANGIO INTRA EXTRACRAN SEL COM CAROTID INNOMINATE BILAT  MOD SED  Result Date: 11/26/2019 CLINICAL DATA:  Left-sided weakness arm greater than leg. CT angiogram of the head and neck demonstrating high-grade stenosis of the common carotid arteries bilaterally. EXAM: BILATERAL COMMON CAROTID AND INNOMINATE ANGIOGRAPHY COMPARISON:  CT angiogram of the head and neck of November 30, 2019. MEDICATIONS: Heparin 2000 units IA. No antibiotic was administered within 1  hour of the procedure. ANESTHESIA/SEDATION: Versed 0.5 mg IV; Fentanyl 12.5 mcg IV Moderate Sedation Time:  35 minutes The patient was continuously monitored during the procedure by the interventional radiology nurse under my direct supervision. CONTRAST:  Omnipaque 300 approximately 65 mL FLUOROSCOPY TIME:  Fluoroscopy Time: 10 minutes 36 seconds (530 mGy). COMPLICATIONS: None immediate. TECHNIQUE: Informed written consent was obtained from the patient after a thorough discussion of the procedural risks, benefits and alternatives. All questions were addressed. Maximal Sterile Barrier Technique was utilized including caps, mask, sterile gowns, sterile gloves, sterile drape, hand hygiene and skin antiseptic. A timeout was performed prior to the initiation of the procedure. The right forearm to the wrist was prepped and draped in the usual sterile manner. The right radial artery was then identified with ultrasound, and its morphology documented. A dorsal palmar anastomosis was verified to be present. Using ultrasound guidance, radial access was obtained using a micropuncture set over a 0.018 inch micro guidewire. A 4/5 French radial sheath was then inserted. The micro guidewire, and the obturator were removed. Good aspiration obtained from the side port of the radial sheath. A cocktail of 1000 units of heparin, and 200 mcg of nitroglycerin was then infused through the radial sheath without event. A radial arteriogram was performed. Over a 0.035 inch Roadrunner guidewire, a Simmons 2 diagnostic catheter was then  advanced to the aortic arch region, and selectively positioned in the right common carotid artery, the left common carotid artery, the left vertebral artery and the right subclavian artery. Following the procedure, hemostasis at the right radial puncture site was achieved with a wrist band. Distal right radial pulse was verified to be present. FINDINGS: The right common carotid arteriogram demonstrates segmental high-grade stenosis of 90% of the right common carotid artery at the junction of the middle and proximal 1/3 is seen. Distal to this opacification of the right common carotid artery and the right internal carotid artery is seen. There is a mild stenosis at the origin of the right internal carotid artery. More distally the petrous, the cavernous and the supraclinoid segments are widely patent. The right middle cerebral artery and the right anterior cerebral artery opacify into the capillary and venous phases. Cross-filling via the anterior communicating artery of the left anterior cerebral A2 segment and distally is seen. No opacification of the right external carotid artery at its origin is seen. The right subclavian arteriogram demonstrates hypoplastic right vertebral artery with flow seen to the cranial skull base. Retrograde opacification of the right external carotid artery territory is seen via the ipsilateral right occipital artery. The left common carotid arteriogram demonstrates severe segmental stenosis of 90% plus at the junction of the middle and proximal third of the left common carotid artery. More distally post stenotic dilatation is seen. Patency is seen of the left internal carotid artery at the bulb to the cranial skull base. The petrous, the cavernous and the supraclinoid segments are widely patent. The left middle cerebral artery and the left anterior cerebral artery opacify into the capillary and venous phases. Non opacification of the left external carotid artery is seen. The dominant  left vertebral artery origin is widely patent. The vessel opacifies to the cranial skull base. Patency is seen of the left vertebrobasilar junction, and the left posterior-inferior cerebellar artery. There is retrograde opacification of the left external carotid artery distribution via multiple musculoskeletal collaterals arising from the distal left vertebral artery. More distally the left vertebrobasilar junction, the basilar artery, the posterior cerebral arteries,  the superior cerebellar arteries and the anterior-inferior cerebellar arteries is seen into the capillary and venous phases. Retrograde opacification of the right vertebrobasilar junction proximal to the right posterior-inferior cerebellar artery is seen. Partial retrograde opacification via the left posterior communicating artery of the left middle cerebral artery distribution is seen. 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 PLAN: Findings reviewed with the patient and referring neurologist. Given the symptomatic nature of the right common carotid artery stenosis, endovascular revascularization scheduled for early next week. Electronically Signed   By: Luanne Bras M.D.   On: 11/23/2019 16:25   IR ANGIO VERTEBRAL SEL VERTEBRAL UNI L MOD SED  Result Date: 11/26/2019 CLINICAL DATA:  Left-sided weakness arm greater than leg. CT angiogram of the head and neck demonstrating high-grade stenosis of the common carotid arteries bilaterally. EXAM: BILATERAL COMMON CAROTID AND INNOMINATE ANGIOGRAPHY COMPARISON:  CT angiogram of the head and neck of November 30, 2019. MEDICATIONS: Heparin 2000 units IA. No antibiotic was administered within 1 hour of the procedure.  ANESTHESIA/SEDATION: Versed 0.5 mg IV; Fentanyl 12.5 mcg IV Moderate Sedation Time:  35 minutes The patient was continuously monitored during the procedure by the interventional radiology nurse under my direct supervision. CONTRAST:  Omnipaque 300 approximately 65 mL FLUOROSCOPY TIME:  Fluoroscopy Time: 10 minutes 36 seconds (530 mGy). COMPLICATIONS: None immediate. TECHNIQUE: Informed written consent was obtained from the patient after a thorough discussion of the procedural risks, benefits and alternatives. All questions were addressed. Maximal Sterile Barrier Technique was utilized including caps, mask, sterile gowns, sterile gloves, sterile drape, hand hygiene and skin antiseptic. A timeout was performed prior to the initiation of the procedure. The right forearm to the wrist was prepped and draped in the usual sterile manner. The right radial artery was then identified with ultrasound, and its morphology documented. A dorsal palmar anastomosis was verified to be present. Using ultrasound guidance, radial access was obtained using a micropuncture set over a 0.018 inch micro guidewire. A 4/5 French radial sheath was then inserted. The micro guidewire, and the obturator were removed. Good aspiration obtained from the side port of the radial sheath. A cocktail of 1000 units of heparin, and 200 mcg of nitroglycerin was then infused through the radial sheath without event. A radial arteriogram was performed. Over a 0.035 inch Roadrunner guidewire, a Simmons 2 diagnostic catheter was then advanced to the aortic arch region, and selectively positioned in the right common carotid artery, the left common carotid artery, the left vertebral artery and the right subclavian artery. Following the procedure, hemostasis at the right radial puncture site was achieved with a wrist band. Distal right radial pulse was verified to be present. FINDINGS: The right common carotid arteriogram demonstrates segmental high-grade stenosis  of 90% of the right common carotid artery at the junction of the middle and proximal 1/3 is seen. Distal to this opacification of the right common carotid artery and the right internal carotid artery is seen. There is a mild stenosis at the origin of the right internal carotid artery. More distally the petrous, the cavernous and the supraclinoid segments are widely patent. The right middle cerebral artery and the right anterior cerebral artery opacify into the capillary and venous phases. Cross-filling via the anterior communicating artery of the left anterior cerebral  A2 segment and distally is seen. No opacification of the right external carotid artery at its origin is seen. The right subclavian arteriogram demonstrates hypoplastic right vertebral artery with flow seen to the cranial skull base. Retrograde opacification of the right external carotid artery territory is seen via the ipsilateral right occipital artery. The left common carotid arteriogram demonstrates severe segmental stenosis of 90% plus at the junction of the middle and proximal third of the left common carotid artery. More distally post stenotic dilatation is seen. Patency is seen of the left internal carotid artery at the bulb to the cranial skull base. The petrous, the cavernous and the supraclinoid segments are widely patent. The left middle cerebral artery and the left anterior cerebral artery opacify into the capillary and venous phases. Non opacification of the left external carotid artery is seen. The dominant left vertebral artery origin is widely patent. The vessel opacifies to the cranial skull base. Patency is seen of the left vertebrobasilar junction, and the left posterior-inferior cerebellar artery. There is retrograde opacification of the left external carotid artery distribution via multiple musculoskeletal collaterals arising from the distal left vertebral artery. More distally the left vertebrobasilar junction, the basilar  artery, the posterior cerebral arteries, the superior cerebellar arteries and the anterior-inferior cerebellar arteries is seen into the capillary and venous phases. Retrograde opacification of the right vertebrobasilar junction proximal to the right posterior-inferior cerebellar artery is seen. Partial retrograde opacification via the left posterior communicating artery of the left middle cerebral artery distribution is seen. 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 PLAN: Findings reviewed with the patient and referring neurologist. Given the symptomatic nature of the right common carotid artery stenosis, endovascular revascularization scheduled for early next week. Electronically Signed   By: Luanne Bras M.D.   On: 11/23/2019 16:25    Microbiology: Recent Results (from the past 240 hour(s))  Respiratory Panel by RT PCR (Flu A&B, Covid) - Nasopharyngeal Swab     Status: None   Collection Time: 12/10/19  4:19 PM   Specimen: Nasopharyngeal Swab  Result Value Ref Range Status   SARS Coronavirus 2 by RT PCR NEGATIVE NEGATIVE Final    Comment: (NOTE) SARS-CoV-2 target nucleic acids are NOT DETECTED.  The SARS-CoV-2 RNA is generally detectable in upper respiratoy specimens during the acute phase of infection. The lowest concentration of SARS-CoV-2 viral copies this assay can detect is 131 copies/mL. A negative result does not preclude SARS-Cov-2 infection and should not be used as the sole basis for treatment or other patient management decisions. A negative result may occur with  improper specimen collection/handling, submission of specimen other than nasopharyngeal swab, presence of viral mutation(s) within the areas  targeted by this assay, and inadequate number of viral copies (<131 copies/mL). A negative result must be combined with clinical observations, patient history, and epidemiological information. The expected result is Negative.  Fact Sheet for Patients:  PinkCheek.be  Fact Sheet for Healthcare Providers:  GravelBags.it  This test is no t yet approved or cleared by the [place] FDA and  has been authorized for detection and/or diagnosis of SARS-CoV-2 by FDA under an Emergency Use Authorization (EUA). This EUA will remain  in effect (meaning this test can be used) for the duration of the COVID-19 declaration under  Section 564(b)(1) of the Act, 21 U.S.C. section 360bbb-3(b)(1), unless the authorization is terminated or revoked sooner.     Influenza A by PCR NEGATIVE NEGATIVE Final   Influenza B by PCR NEGATIVE NEGATIVE Final    Comment: (NOTE) The Xpert Xpress SARS-CoV-2/FLU/RSV assay is intended as an aid in  the diagnosis of influenza from Nasopharyngeal swab specimens and  should not be used as a sole basis for treatment. Nasal washings and  aspirates are unacceptable for Xpert Xpress SARS-CoV-2/FLU/RSV  testing.  Fact Sheet for Patients: PinkCheek.be  Fact Sheet for Healthcare Providers: GravelBags.it  This test is not yet approved or cleared by the [place] FDA and  has been authorized for detection and/or diagnosis of SARS-CoV-2 by  FDA under an Emergency Use Authorization (EUA). This EUA will remain  in effect (meaning this test can be used) for the duration of the  Covid-19 declaration under Section 564(b)(1) of the Act, 21  U.S.C. section 360bbb-3(b)(1), unless the authorization is  terminated or revoked. Performed at [facility], [place] [address] [place], [place] [address]      Labs: Basic Metabolic Panel: Recent Labs  Lab  12/06/19 0019 12/07/19 0038 12/09/19 0322 12/10/19 0649 12/11/19 0348  NA 130* 135 136 140 140  K 4.8 3.9 4.0 3.8 4.1  CL 95* 105 102 104 104  CO2 _0 GLUCOSE 165* 107* 109* 114* 147*  BUN _1 CREATININE 0.66 0.48* 0.55* 0.64 0.59*  CALCIUM 8.3* 7.0* 8.3* 8.2* 8.4*  MG 2.0  --   --   --   --   PHOS 3.8  --   --   --   --    Liver Function Tests: No results for input(s): AST, ALT, ALKPHOS, BILITOT, PROT, ALBUMIN in the last 168 hours. No results for input(s): LIPASE, AMYLASE in the last 168 hours. No results for input(s): AMMONIA in the last 168 hours. CBC: Recent Labs  Lab 12/07/19 0038 12/07/19 0038 12/08/19 0045 12/09/19 0322 12/10/19 0649 12/11/19 0348 12/12/19 0031  WBC 12.3*   < > 18.4* 13.5* 13.1* 12.9* 15.7*  NEUTROABS 10.7*  --   --  11.9* 11.4* 11.4*  --   HGB 6.6*   < > 11.3* 10.7* 10.9* 10.7* 11.6*  HCT 21.0*   < > 34.2* 32.6* 34.1* 33.6* 37.1*  MCV 89.0   < > 83.8 85.6 86.3 88.4 87.5  PLT 511*   < > 503* 491* 467* 435* 427*   < > = values in this interval not displayed.   Cardiac Enzymes: No results for input(s): CKTOTAL, CKMB, CKMBINDEX, TROPONINI in the last 168 hours. BNP: BNP (last 3 results) Recent Labs    11/15/19 1243 11/21/19 1841 11/28/19 1016  BNP 36.8 75.9 106.4*    ProBNP (last 3 results) No results for input(s): PROBNP in the last 8760 hours.  CBG: Recent Labs  Lab 12/11/19 0659 12/11/19 1141 12/11/19 1624 12/11/19 1948 12/12/19 0809  GLUCAP 112* 165* 201* 149* 115*       Signed:  Dia Crawford, MD [facility] [phone] pager

## 2019-12-12 NOTE — Progress Notes (Addendum)
ANTICOAGULATION CONSULT NOTE   Pharmacy Consult:  IV Heparin Indication:  Pumonary Embolism  Patient Measurements: Height: 5\' 5"  (165.1 cm) Weight: 42.6 kg (93 lb 14.7 oz) IBW/kg (Calculated) : 61.5 Heparin Dosing Weight: 42.6 kg  Vital Signs: Temp: 97.8 F (36.6 C) (11/10 1636) Temp Source: Axillary (11/10 1636) BP: 119/81 (11/10 1949) Pulse Rate: 98 (11/10 1949)  Labs: Recent Labs    12/10/19 0649 12/10/19 0649 12/11/19 0348 12/11/19 0348 12/12/19 0031 12/12/19 1522  HGB 10.9*   < > 10.7*   < > 11.6* 12.3*  HCT 34.1*   < > 33.6*  --  37.1* 39.8  PLT 467*   < > 435*  --  427* 441*  CREATININE 0.64  --  0.59*  --   --  0.66   < > = values in this interval not displayed.    Estimated Creatinine Clearance: 53.3 mL/min (by C-G formula based on SCr of 0.66 mg/dL).  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  Pt was on IV heparin earlier this admission; heparin was discontinued on 11/6, due to hematuria. Pt having acute respiratory decompensation, D-dimer >20.0. Pharmacy was consulted to restart heparin for PE treatment. Due to bleeding hx, will target low end of heparin goal range (0.3-0.5 units/ml).  H/H 12.3/39.8, platelets 441; per RN, pt still having hematuria from foley; RN spoke with E. Ouma, NP on call for [facility], who said to start heparin, as benefit outweighs risk in this situation.  When pt was on heparin earlier this admission, he was subtherapeutic on last heparin infusion rate of 850 units/hr (and pt's weight is similar to weight while on that infusion rate), so will adjust infusion upward from that point. Will omit bolus, due to CVA and hematuria.  Goal of Therapy:  Heparin level 0.3-0.5 units/ml Monitor platelets by anticoagulation protocol: Yes   Plan:  Start heparin infusion at 950 units/hr Check 6-hr heparin level Monitor daily heparin level, CBC Monitor  for signs/symptoms of bleeding  Thank you for allowing pharmacy to be a part of this patient's care.  Gillermina Hu, PharmD, BCPS, [facility] Clinical Pharmacist 12/12/2019 8:15 PM

## 2019-12-12 NOTE — Progress Notes (Signed)
Pt blood pressure taken as low as 70's/50's and 80's/50's manually. Pt drowsy - recently received total of 4 mg Morphine. Not alert enough to answer if feels dizzy/cold/etc. Dr. Sherral Hammers was called and made aware and says he plans to order albumin/fluids.   Justice Rocher, RN

## 2019-12-12 NOTE — Progress Notes (Signed)
PROGRESS NOTE    Evan [name]  RFF:[id number] DOB: 1950-10-13 DOA: 11/21/2019 PCP: Patient, No Pcp Per     Brief Narrative:  Evan [name] is a 69 y.o. 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  Patient presented to the ED on 10/20 with significant confusion, weakness for 24 hours.  He was found to have CVA with carotid stenosis. Carotid stenting was recommended but was delayed because of his poor respiratory status and poor nutritional status.  Core track feeding was started to augment nutrition while pending carotid stenting. Respiratory status subsequently improved and he underwent right carotid stenting on 11/4.  Postprocedure, his hospital course was complicated with low blood pressure, drop in hemoglobin, altered mental status, poor cough reflex, persistent gurgling of secretions and aspiration pneumonia. He has overall improved now and is currently pending placement.    Subjective: A/O x4, positive acute on chronic respiratory distress.  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   -Imaging showed acute ischemic infarct involving the left temporoccipital region, -LDL 154, A1c 6.3. Echocardiogram EF 55 to 60%. CTA head and neck did not show LVO but showed severe bilateral carotid artery stenosis therefore IR consulted for stent placement. -11/4, underwent right carotid artery stenting by neuro IR.  -Because of coexisting DVT, patient was started on heparin drip and Brilinta. However heparin drip had to be held on 11/6 because of hematuria. -11/10 anticoagulation not restarted.  See goals of care  Acute on chronic respiratory failure with hypoxia/COPD/stage III lung cancer -Per patient on 2 L O2 at home prior to admission -11/10 currently on 2 l O2 which is his home regimen. -Patient and son understand patient is to schedule follow-up with PCCM when it gets close to time for release from SNF -Patient and son understand patient is to schedule follow-up with Dr. Julien Nordmann oncology when he gets close time for release from SNF -11/10 ADDENDUM needs to bedside prior to discharge patient in acute respiratory distress with hypoxia.  Tachypneic, tachycardic  Severe sepsis/ HCAP -11/10 patient meets criteria for severe sepsis WBC> 12, RR> 20, HR> 90, area of infection lungs.  Lactic acid> 2.2 -11/10 Albumin 25 g -11/10 normal saline 155mll/hr -11/10 DC Augmentin, start Cefepime -DuoNeb QID -Solu-Medrol 60 mg BID -11/10 DC scopolamine patch making it extremely difficult for patient to clear sputum -Morphine PRN -CTA chest PE protocol pending -11/11 consider starting hypertonic saline nebulizer treatments  Chronic gurgling throat sound -During this hospitalization, patient had bilateral diffuse coarse breath sounds from aspiration pneumonia and upper airway secretion.  -He states that for several months, patient is having gurgling sound in his throat. He reports  that Dr. Julien Nordmann, his oncologist suspects it is secondary to scar tissue from cancer. Patient does not feel any  pain or distress related to that. -Patient has not required more than 2 to 3 L of oxygen throughout this stay. -Titrate O2 to maintain SPO2> 88% -Complete course of Augmentin last dose 11/15 -Continue a tapering course of steroids. -Also continue bronchodilators, scopolamine. -11/10 patient allowed some NTS to be performed, thick yellow mucus obtained.  Patient would not allow a thorough NTS to be performed. -11/10 per patient and patient's son throat gurgling is NOT CHRONIC, but started when patient diagnosed with pneumonia. -See goals of care  Acute delirium/Acute metabolic encephalopathy -Multifactorial: Prolonged hospitalization, stroke, hypoxia. -11/10 resolved patient A/O x4 -Seroquel 25 mg twice daily.    Hypotension -Maintain MAP> 65 -See severe sepsis  Acute DVT of left upper extremity/Chronicleft IJ thrombosis -CT angio of neck from 10/20 showed chronic findings suspicious for left IJ thrombosis but at the time, patient did not have any swelling of the left upper extremity.  -11/5, patient was noted to have worsening left upper extremity swelling and hence ultrasound duplex was obtained which showed an acute DVT involving the left internal jugular vein, left subclavian vein, left jugular vein, left brachial veins and left basilic vein. -Patient has been started on heparin drip but discontinued secondary to acute blood loss anemia and hematuria. -11/10 D-dimer> 20, given patient's acute respiratory desaturation concerning for PE -Empirically restart heparin drip maintain at low end of therapeutic level.  Await CTA PE protocol findings -See goals of care  Acute blood loss anemia/Hematuria -On11/5, hemoglobin dropped down to the lowest of 6.6.  -11/5 transfuse 2 units PRBC  -CT abdomen pelvis did not show any evidence of retroperitoneal bleed. 11/6, patienthad to be started on heparin drip for left upper external DVT but he started having hematuria again.  -Heparin drip  was stopped. Brilinta +Aaspirin 81 mg daily for stroke prevention continued.  Severe protein calorie malnutrition -10/29 - 11/5 -patient was fed through core track because of poor nutritional status prior to surgery. -Currently on oral feeding.  Chronic urinary retention -Patient admitted with indwelling Foley catheter> 58 days old.  So was removed -11/10 increaseTamsulosin 0.4 mg BID -11/10 reinsert Foley catheter   Goals of care -Spoke at length with patient, son, and daughter on the phone.  Explained that patient was at significant risk for future PE, DVT given that he had cancer and a current DVT.  Stated that we could keep patient overnight and restart anticoagulation and determine if he has acute bleed again, if he does not would discharge on anticoagulant.  Patient stated he understood that he could develop a clot which could be terminal but that he wanted to be discharged to SNF today.  Patient's children stated they would honor their father's wishes. ADDENDUM 11/10 again explained to patient that if he was going to improve he was going to have to allow [place] to perform therapies to improve his respiratory status, some of which were not going to be comfortable.  Patient will not allow a number of therapies to be performed.  Son at bedside for discussion of this plan of care.  Patient and son agreed palliative care needed to be involved -11/10 palliative care consulted; believe patient is having a hard time deciding on what his goals of care are, and understanding that he is in the terminal phase of his disease process.   DVT prophylaxis: SCD Code Status: Full Family Communication: 11/10  son at bedside/daughter on the phone for discussion of plan of care answered all questions. Status is: Inpatient    Dispo: The patient is from: Home              Anticipated d/c is to: SNF vs hospice              Anticipated d/c date is:??              Patient currently extremely  unstable      Consultants:  Neurology   Procedures/Significant Events:  11/10 PCXR;Chronic accentuation of perihilar markings with RIGHT upper lobe volume loss/scarring likely related to prior cancer treatment. -Increased elevation of LEFT diaphragm.   I have personally reviewed and interpreted all radiology studies and my findings are as above.  VENTILATOR SETTINGS: Rebreather 11/10 FiO2 100% Flow 6 L/min SPO2 100%   Cultures 11/10 sputum pending   Antimicrobials: Anti-infectives (From admission, onward)   Start     Ordered Stop   12/12/19 2200  ceFEPIme (MAXIPIME) 2 g in sodium chloride 0.9 % 100 mL IVPB        12/12/19 1953     12/12/19 0000  amoxicillin-clavulanate (AUGMENTIN) 875-125 MG tablet        12/12/19 1128     12/10/19 1415  amoxicillin-clavulanate (AUGMENTIN) 875-125 MG per tablet 1 tablet  Status:  Discontinued        12/10/19 1319 12/12/19 1953   12/07/19 1500  Ampicillin-Sulbactam (UNASYN) 3 g in sodium chloride 0.9 % 100 mL IVPB  Status:  Discontinued        12/07/19 1404 12/10/19 1319   11/28/19 0000  ceFAZolin (ANCEF) IVPB 2g/100 mL premix  Status:  Discontinued        11/27/19 1730 11/28/19 0857   11/28/19 0000  ceFAZolin (ANCEF) IVPB 2g/100 mL premix  Status:  Discontinued        11/27/19 1730 11/27/19 1733   11/25/19 1530  Ampicillin-Sulbactam (UNASYN) 3 g in sodium chloride 0.9 % 100 mL IVPB        11/25/19 1432 12/02/19 2359       Devices    LINES / TUBES:      Continuous Infusions: . feeding supplement (OSMOLITE 1.2 CAL) 60 mL/hr at 12/06/19 0600     Objective: Vitals:   12/11/19 2025 12/11/19 2356 12/12/19 0340 12/12/19 0500  BP:  118/88 122/83   Pulse:  (!) 111 100 (!) 109  Resp:  20 20 20   Temp:  98.7 F (37.1 C) 98.1 F (36.7 C)   TempSrc:  Oral Oral   SpO2: 96% 94% 92% 93%  Weight:    43.8 kg  Height:        Intake/Output Summary (Last 24 hours) at 12/12/2019 0748 Last data filed at 12/11/2019 2315 Gross per  24 hour  Intake 480 ml  Output 2050 ml  Net -1570 ml   Filed Weights   12/09/19 1330 12/11/19 0500 12/12/19 0500  Weight: 45.1 kg 44.6 kg 43.8 kg    Examination:  General: A/O x4, acute on chronic respiratory distress, cachectic Eyes: negative scleral hemorrhage, negative anisocoria, negative icterus ENT: Negative Runny nose, negative gingival bleeding, Neck:  Negative scars, masses, torticollis, lymphadenopathy, JVD Lungs: tachypneic, severe diffuse rhonchi bilaterally without wheezes or crackles, using accessory muscles to breathe Cardiovascular: Tachycardic without murmur gallop or rub normal S1 and S2 Abdomen: negative abdominal pain, nondistended, positive soft, bowel sounds, no rebound, no ascites, no appreciable mass Extremities: No significant cyanosis, clubbing,  or edema bilateral lower extremities.  RIGHT BKA stump well-healed no sign of infection. Skin: Negative rashes, lesions, ulcers Psychiatric:  Negative depression, negative anxiety, negative fatigue, negative mania  Central nervous system:  Cranial nerves II through XII intact, tongue/uvula midline, all extremities muscle strength 5/5, sensation intact throughout, negative dysarthria, negative expressive aphasia, negative receptive aphasia.  .     Data Reviewed: Care during the described time interval was provided by me .  I have reviewed this patient's available data, including medical history, events of note, physical examination, and all test results as part of my evaluation.  CBC: Recent Labs  Lab 12/07/19 0038 12/07/19 0038 12/08/19 0045 12/09/19 0322 12/10/19 0649 12/11/19 0348 12/12/19 0031  WBC 12.3*   < > 18.4* 13.5* 13.1* 12.9* 15.7*  NEUTROABS 10.7*  --   --  11.9* 11.4* 11.4*  --   HGB 6.6*   < > 11.3* 10.7* 10.9* 10.7* 11.6*  HCT 21.0*   < > 34.2* 32.6* 34.1* 33.6* 37.1*  MCV 89.0   < > 83.8 85.6 86.3 88.4 87.5  PLT 511*   < > 503* 491* 467* 435* 427*   < > = values in this interval not  displayed.   Basic Metabolic Panel: Recent Labs  Lab 12/06/19 0019 12/07/19 0038 12/09/19 0322 12/10/19 0649 12/11/19 0348  NA 130* 135 136 140 140  K 4.8 3.9 4.0 3.8 4.1  CL 95* 105 102 104 104  CO2 25 23 27 27 29   GLUCOSE 165* 107* 109* 114* 147*  BUN 16 13 10 9 11   CREATININE 0.66 0.48* 0.55* 0.64 0.59*  CALCIUM 8.3* 7.0* 8.3* 8.2* 8.4*  MG 2.0  --   --   --   --   PHOS 3.8  --   --   --   --    GFR: Estimated Creatinine Clearance: 54.8 mL/min (A) (by C-G formula based on SCr of 0.59 mg/dL (L)). Liver Function Tests: No results for input(s): AST, ALT, ALKPHOS, BILITOT, PROT, ALBUMIN in the last 168 hours. No results for input(s): LIPASE, AMYLASE in the last 168 hours. No results for input(s): AMMONIA in the last 168 hours. Coagulation Profile: No results for input(s): INR, PROTIME in the last 168 hours. Cardiac Enzymes: No results for input(s): CKTOTAL, CKMB, CKMBINDEX, TROPONINI in the last 168 hours. BNP (last 3 results) No results for input(s): PROBNP in the last 8760 hours. HbA1C: No results for input(s): HGBA1C in the last 72 hours. CBG: Recent Labs  Lab 12/11/19 0339 12/11/19 0659 12/11/19 1141 12/11/19 1624 12/11/19 1948  GLUCAP 166* 112* 165* 201* 149*   Lipid Profile: No results for input(s): CHOL, HDL, LDLCALC, TRIG, CHOLHDL, LDLDIRECT in the last 72 hours. Thyroid Function Tests: No results for input(s): TSH, T4TOTAL, FREET4, T3FREE, THYROIDAB in the last 72 hours. Anemia Panel: No results for input(s): VITAMINB12, FOLATE, FERRITIN, TIBC, IRON, RETICCTPCT in the last 72 hours. Sepsis Labs: No results for input(s): PROCALCITON, LATICACIDVEN in the last 168 hours.  Recent Results (from the past 240 hour(s))  Respiratory Panel by RT PCR (Flu A&B, Covid) - Nasopharyngeal Swab     Status: None   Collection Time: 12/10/19  4:19 PM   Specimen: Nasopharyngeal Swab  Result Value Ref Range Status   SARS Coronavirus 2 by RT PCR NEGATIVE NEGATIVE Final     Comment: (NOTE) SARS-CoV-2 target nucleic acids are NOT DETECTED.  The SARS-CoV-2 RNA is generally detectable in upper respiratoy specimens during the acute phase of infection. The  lowest concentration of SARS-CoV-2 viral copies this assay can detect is 131 copies/mL. A negative result does not preclude SARS-Cov-2 infection and should not be used as the sole basis for treatment or other patient management decisions. A negative result may occur with  improper specimen collection/handling, submission of specimen other than nasopharyngeal swab, presence of viral mutation(s) within the areas targeted by this assay, and inadequate number of viral copies (<131 copies/mL). A negative result must be combined with clinical observations, patient history, and epidemiological information. The expected result is Negative.  Fact Sheet for Patients:  PinkCheek.be  Fact Sheet for Healthcare Providers:  GravelBags.it  This test is no t yet approved or cleared by the [place] FDA and  has been authorized for detection and/or diagnosis of SARS-CoV-2 by FDA under an Emergency Use Authorization (EUA). This EUA will remain  in effect (meaning this test can be used) for the duration of the COVID-19 declaration under Section 564(b)(1) of the Act, 21 U.S.C. section 360bbb-3(b)(1), unless the authorization is terminated or revoked sooner.     Influenza A by PCR NEGATIVE NEGATIVE Final   Influenza B by PCR NEGATIVE NEGATIVE Final    Comment: (NOTE) The Xpert Xpress SARS-CoV-2/FLU/RSV assay is intended as an aid in  the diagnosis of influenza from Nasopharyngeal swab specimens and  should not be used as a sole basis for treatment. Nasal washings and  aspirates are unacceptable for Xpert Xpress SARS-CoV-2/FLU/RSV  testing.  Fact Sheet for Patients: PinkCheek.be  Fact Sheet for Healthcare  Providers: GravelBags.it  This test is not yet approved or cleared by the [place] FDA and  has been authorized for detection and/or diagnosis of SARS-CoV-2 by  FDA under an Emergency Use Authorization (EUA). This EUA will remain  in effect (meaning this test can be used) for the duration of the  Covid-19 declaration under Section 564(b)(1) of the Act, 21  U.S.C. section 360bbb-3(b)(1), unless the authorization is  terminated or revoked. Performed at [facility], [place] [address], [place],  [address]          Radiology Studies: No results found.      Scheduled Meds: .  stroke: mapping our early stages of recovery book   Does not apply Once  . amoxicillin-clavulanate  1 tablet Oral Q12H  . arformoterol  15 mcg Nebulization BID  . aspirin EC  81 mg Oral Daily  . atorvastatin  80 mg Oral Daily  . budesonide (PULMICORT) nebulizer solution  0.25 mg Nebulization BID  . chlorhexidine  15 mL Mouth/Throat Once  . Chlorhexidine Gluconate Cloth  6 each Topical Daily  . feeding supplement  237 mL Oral TID BM  . feeding supplement (PROSource TF)  45 mL Per Tube Daily  . guaiFENesin  600 mg Oral BID  . multivitamin with minerals  1 tablet Oral Daily  . nicotine  14 mg Transdermal Daily  . pantoprazole  40 mg Oral BID  . prednisoLONE  30 mg Oral Daily  . QUEtiapine  25 mg Oral BID  . revefenacin  175 mcg Nebulization Daily  . scopolamine  1 patch Transdermal Q72H  . tamsulosin  0.4 mg Oral QHS  . ticagrelor  30 mg Oral BID   Or  . ticagrelor  30 mg Per Tube BID   Continuous Infusions: . feeding supplement (OSMOLITE 1.2 CAL) 60 mL/hr at 12/06/19 0600     LOS: 21 days    Time spent: 75 min    Baleria Wyman J,  MD [facility] Pager [phone]  If 7PM-7AM, please contact night-coverage [email or web address] Password [facility] 12/12/2019, 7:48 AM

## 2019-12-12 NOTE — Progress Notes (Signed)
SLP Cancellation Note  Patient Details Name: Evan [name] MRN: [id number] DOB: March 23, 1950   Cancelled treatment:       Reason Eval/Treat Not Completed: Patient declined, no reason specified (Pt was approached for treatment, but refused p.o. intake stating that he is "getting checked out" and is therefore not interested in eating. )  Rosmarie Esquibel I. Hardin Negus, [place], [place] Office number [phone] Pager [place] 12/12/2019, 12:14 PM

## 2019-12-13 ENCOUNTER — Inpatient Hospital Stay (HOSPITAL_COMMUNITY): Payer: Medicare PPO

## 2019-12-13 DIAGNOSIS — R0609 Other forms of dyspnea: Secondary | ICD-10-CM

## 2019-12-13 DIAGNOSIS — R0600 Dyspnea, unspecified: Secondary | ICD-10-CM

## 2019-12-13 DIAGNOSIS — J439 Emphysema, unspecified: Secondary | ICD-10-CM

## 2019-12-13 DIAGNOSIS — J449 Chronic obstructive pulmonary disease, unspecified: Secondary | ICD-10-CM

## 2019-12-13 LAB — COMPREHENSIVE METABOLIC PANEL
ALT: 32 U/L (ref 0–44)
AST: 18 U/L (ref 15–41)
Albumin: 2.4 g/dL — ABNORMAL LOW (ref 3.5–5.0)
Alkaline Phosphatase: 86 U/L (ref 38–126)
Anion gap: 9 (ref 5–15)
BUN: 14 mg/dL (ref 8–23)
CO2: 27 mmol/L (ref 22–32)
Calcium: 8.6 mg/dL — ABNORMAL LOW (ref 8.9–10.3)
Chloride: 102 mmol/L (ref 98–111)
Creatinine, Ser: 0.61 mg/dL (ref 0.61–1.24)
GFR, Estimated: >60 mL/min (ref >60)
Glucose, Bld: 139 mg/dL — ABNORMAL HIGH (ref 70–99)
Potassium: 4.9 mmol/L (ref 3.5–5.1)
Sodium: 138 mmol/L (ref 135–145)
Total Bilirubin: 0.5 mg/dL (ref 0.3–1.2)
Total Protein: 5.5 g/dL — ABNORMAL LOW (ref 6.5–8.1)

## 2019-12-13 LAB — POCT I-STAT 7, (LYTES, BLD GAS, ICA,H+H)
Acid-Base Excess: 4 mmol/L — ABNORMAL HIGH (ref 0.0–2.0)
Bicarbonate: 31.4 mmol/L — ABNORMAL HIGH (ref 20.0–28.0)
Calcium, Ion: 1.27 mmol/L (ref 1.15–1.40)
HCT: 36 % — ABNORMAL LOW (ref 39.0–52.0)
Hemoglobin: 12.2 g/dL — ABNORMAL LOW (ref 13.0–17.0)
O2 Saturation: 99 %
Patient temperature: 97.4
Potassium: 4.7 mmol/L (ref 3.5–5.1)
Sodium: 137 mmol/L (ref 135–145)
TCO2: 33 mmol/L — ABNORMAL HIGH (ref 22–32)
pCO2 arterial: 55.3 mmHg — ABNORMAL HIGH (ref 32.0–48.0)
pH, Arterial: 7.359 (ref 7.350–7.450)
pO2, Arterial: 150 mmHg — ABNORMAL HIGH (ref 83.0–108.0)

## 2019-12-13 LAB — D-DIMER, QUANTITATIVE: D-Dimer, Quant: >20 ug/mL-FEU — ABNORMAL HIGH (ref 0.00–0.50)

## 2019-12-13 LAB — LACTIC ACID, PLASMA: Lactic Acid, Venous: 1.3 mmol/L (ref 0.5–1.9)

## 2019-12-13 LAB — CBC
HCT: 35 % — ABNORMAL LOW (ref 39.0–52.0)
Hemoglobin: 10.9 g/dL — ABNORMAL LOW (ref 13.0–17.0)
MCH: 27.5 pg (ref 26.0–34.0)
MCHC: 31.1 g/dL (ref 30.0–36.0)
MCV: 88.2 fL (ref 80.0–100.0)
Platelets: 348 10*3/uL (ref 150–400)
RBC: 3.97 MIL/uL — ABNORMAL LOW (ref 4.22–5.81)
RDW: 18.4 % — ABNORMAL HIGH (ref 11.5–15.5)
WBC: 21.6 10*3/uL — ABNORMAL HIGH (ref 4.0–10.5)
nRBC: 0 % (ref 0.0–0.2)

## 2019-12-13 LAB — PHOSPHORUS: Phosphorus: 4.4 mg/dL (ref 2.5–4.6)

## 2019-12-13 LAB — GLUCOSE, CAPILLARY: Glucose-Capillary: 124 mg/dL — ABNORMAL HIGH (ref 70–99)

## 2019-12-13 LAB — BRAIN NATRIURETIC PEPTIDE: B Natriuretic Peptide: 177.6 pg/mL — ABNORMAL HIGH (ref 0.0–100.0)

## 2019-12-13 LAB — MAGNESIUM: Magnesium: 2 mg/dL (ref 1.7–2.4)

## 2019-12-13 LAB — HEPARIN LEVEL (UNFRACTIONATED): Heparin Unfractionated: 0.23 IU/mL — ABNORMAL LOW (ref 0.30–0.70)

## 2019-12-13 MED ORDER — GLYCOPYRROLATE 0.2 MG/ML IJ SOLN
0.4000 mg | Freq: Four times a day (QID) | INTRAMUSCULAR | Status: DC
  Administered 2019-12-13 – 2019-12-16 (×9): 0.4 mg via INTRAVENOUS
  Filled 2019-12-13 (×11): qty 2

## 2019-12-13 MED ORDER — LORAZEPAM 2 MG/ML IJ SOLN: 1.0000 mg | INTRAMUSCULAR | Status: DC | PRN

## 2019-12-13 MED ORDER — FLUMAZENIL 0.5 MG/5ML IV SOLN
0.2000 mg | Freq: Once | INTRAVENOUS | Status: AC
  Administered 2019-12-13: 0.2 mg via INTRAVENOUS

## 2019-12-13 MED ORDER — FLUMAZENIL 0.5 MG/5ML IV SOLN: 0.2000 mg | Freq: Once | INTRAVENOUS | Status: AC

## 2019-12-13 MED ORDER — MORPHINE SULFATE (PF) 2 MG/ML IV SOLN
2.0000 mg | INTRAVENOUS | Status: DC | PRN
  Administered 2019-12-13 – 2019-12-14 (×5): 2 mg via INTRAVENOUS
  Filled 2019-12-13 (×5): qty 1

## 2019-12-13 MED ORDER — FLUMAZENIL 0.5 MG/5ML IV SOLN
INTRAVENOUS | Status: AC
  Administered 2019-12-13: 0.2 mg via INTRAVENOUS
  Filled 2019-12-13: qty 5

## 2019-12-13 NOTE — Progress Notes (Signed)
PT Cancellation Note  Patient Details Name: Evan [name] MRN: [id number] DOB: 1950-11-22   Cancelled Treatment:    Reason Eval/Treat Not Completed: Other (comment) pt moving toward palliative/comfort care and therapies have discontinued.  Will sign off at this time.  Thanks. 12/13/2019  Ginger Carne., PT Acute Rehabilitation Services [phone]  (pager) [phone]  (office)   Tessie Fass Mancil Pfenning 12/13/2019, 12:02 PM

## 2019-12-13 NOTE — Progress Notes (Signed)
This chaplain responded to PMT consult for spiritual care.  The chaplain arrived with the medical team and their efforts to ensure the Pt. is comfortable.  The Pt. son-Kris is bedside and expecting other family members to arrive in the near future.  The chaplain understands through story telling the Pt.  is a role model for Vania Rea and the perfect companion for the Pt. two grand-children.    Vania Rea accepted F/U spiritual care as needed.

## 2019-12-13 NOTE — Progress Notes (Signed)
Overnight event  Notified by staff that patient was in respiratory distress with sats in the 60s when he went down for CTA to rule out PE. He had received IV Ativan 0.5 mg previously for agitation.  He became hypotensive with systolic in the 29V.  Tachycardic.  RT was initially called and NT suctioning done.  Copious purulent secretions removed.  CT angiogram could not be done as patient does not have appropriate IV access.  He currently only has one IV, 22-gauge which cannot be used to administer IV contrast.  He has been receiving heparin through this IV line.  Notified by nursing staff that attempts to place another peripheral IV line have been unsuccessful and that he is not a candidate for midline catheter either.  Patient seen and examined at bedside.  Obtunded and only minimally responsive to noxious stimuli.  Satting 100% on 15 L oxygen via nonrebreather.  Hypotensive with systolic in the upper 91Y.  Tachycardic with heart rate in the 110s.  Gurgling throat sounds.  Coarse breath sounds appreciated diffusely upon auscultation of the lungs.  No peripheral edema.  -Currently receiving IV fluid for hypotension -Stat chest x-ray, ABG, CBC, lactate, BNP ordered and currently pending -PCCM consulted and currently at bedside evaluating the patient.

## 2019-12-13 NOTE — Progress Notes (Signed)
Nutrition Brief Note  Chart reviewed. Pt is currently comfort care.  No further nutrition interventions warranted at this time.  Please re-consult as needed.   Corrin Parker, MS, RD, LDN RD pager number/after hours weekend pager number on Amion.

## 2019-12-13 NOTE — Progress Notes (Signed)
PROGRESS NOTE    Evan [name]  ZDG:[id number] DOB: 09-24-1950 DOA: 11/21/2019 PCP: Patient, No Pcp Per     Brief Narrative:  Evan [name] is a 69 y.o. 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  Patient presented to the ED on 10/20 with significant confusion, weakness for 24 hours.  He was found to have CVA with carotid stenosis. Carotid stenting was recommended but was delayed because of his poor respiratory status and poor nutritional status.  Core track feeding was started to augment nutrition while pending carotid stenting. Respiratory status subsequently improved and he underwent right carotid stenting on 11/4.  Postprocedure, his hospital course was complicated with low blood pressure, drop in hemoglobin, altered mental status, poor cough reflex, persistent gurgling of secretions and aspiration pneumonia. He has overall improved now and is currently pending placement.    Subjective: 11/11 afebrile overnight, patient increase WOB when he attempts to speak.  Alert.   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  -Imaging showed acute ischemic infarct involving the left temporoccipital region, -LDL 154, A1c 6.3. Echocardiogram EF 55 to 60%. CTA head and neck did not show LVO but showed severe bilateral carotid artery stenosis therefore IR consulted for stent placement. -11/4, underwent right carotid artery stenting by neuro IR.  -Because of coexisting DVT, patient was started on heparin drip and Brilinta. However heparin drip had to be held on 11/6 because of hematuria. -11/10 anticoagulation not restarted.  See goals of care -11/11 patient and children have decided on comfort care  Acute on chronic respiratory failure with hypoxia/COPD/stage III lung cancer -Per patient on 2 L O2 at home prior to admission -11/10 currently on 2 l O2 which is his home regimen. -Patient and son understand patient is to schedule follow-up with PCCM when it gets close to time for release from SNF -Patient and son understand patient is to schedule follow-up with Dr. Julien Nordmann oncology when he gets close time for release from SNF -11/10 ADDENDUM needs to bedside prior to discharge patient in acute respiratory distress with hypoxia.  Tachypneic, tachycardic 11/11 patient and children have decided on comfort care  Severe sepsis/ HCAP -11/10 patient meets criteria for severe sepsis WBC> 12, RR> 20, HR> 90, area of infection lungs.  Lactic acid> 2.2 -11/10 Albumin 25 g -11/10 DC Augmentin, start Cefepime -DuoNeb QID -Solu-Medrol 60 mg BID -11/10 DC scopolamine patch making it extremely difficult for patient to clear sputum -Morphine PRN -CTA chest PE protocol pending 11/11 patient and children have decided on comfort care  Chronic gurgling throat sound -During this hospitalization, patient had bilateral diffuse coarse breath sounds  from aspiration pneumonia and upper airway secretion.  -He states that for several months, patient is having gurgling sound in his throat.  He reports that Dr. Julien Nordmann, his oncologist suspects it is secondary to scar tissue from cancer. Patient does not feel any pain or distress related to that. -Patient has not required more than 2 to 3 L of oxygen throughout this stay. -Titrate O2 to maintain SPO2> 88% -Complete course of Augmentin last dose 11/15 -Continue a tapering course of steroids. -Also continue bronchodilators, scopolamine. -11/10 patient allowed some NTS to be performed, thick yellow mucus obtained.  Patient would not allow a thorough NTS to be performed. -11/10 per patient and patient's son throat gurgling is NOT CHRONIC, but started when patient diagnosed with pneumonia. -See goals of care  Acute delirium/Acute metabolic encephalopathy -Multifactorial: Prolonged hospitalization, stroke, hypoxia. -11/10 resolved patient A/O x4 -Seroquel 25 mg twice daily.   11/11 patient and children have decided on comfort care  Hypotension -Maintain MAP> 65 -See severe sepsis  Acute DVT of left upper extremity/Chronicleft IJ thrombosis -CT angio of neck from 10/20 showed chronic findings suspicious for left IJ thrombosis but at the time, patient did not have any swelling of the left upper extremity.  -11/5, patient was noted to have worsening left upper extremity swelling and hence ultrasound duplex was obtained which showed an acute DVT involving the left internal jugular vein, left subclavian vein, left jugular vein, left brachial veins and left basilic vein. -Patient has been started on heparin drip but discontinued secondary to acute blood loss anemia and hematuria. -11/10 D-dimer> 20, given patient's acute respiratory desaturation concerning for PE -Empirically restart heparin drip maintain at low end of therapeutic level.  Await CTA PE protocol findings 11/11 patient and children have decided on comfort care  Acute blood loss anemia/Hematuria -On11/5, hemoglobin dropped down to the lowest of 6.6.  -11/5  transfuse 2 units PRBC  -CT abdomen pelvis did not show any evidence of retroperitoneal bleed. 11/6, patienthad to be started on heparin drip for left upper external DVT but he started having hematuria again.  -Heparin drip was stopped. Brilinta +Aaspirin 81 mg daily for stroke prevention continued. 11/11 patient and children have decided on comfort care  Severe protein calorie malnutrition -10/29 - 11/5 -patient was fed through core track because of poor nutritional status prior to surgery. -Currently on oral feeding. 11/11 patient and children have decided on comfort care  Chronic urinary retention -Patient admitted with indwelling Foley catheter> 3 days old.  So was removed -11/10 increaseTamsulosin 0.4 mg BID -11/10 reinsert Foley catheter 11/11 patient and children have decided on comfort care   Goals of care -Spoke at length with patient, son, and daughter on the phone.  Explained that patient was at significant risk for future PE, DVT given that he had cancer and a current DVT.  Stated that we could keep patient overnight and restart anticoagulation and determine if he has acute bleed again, if he does not would discharge on anticoagulant.  Patient stated he understood that he could develop a clot which could be terminal but that he wanted to be discharged to SNF today.  Patient's children stated they would honor their father's wishes. ADDENDUM 11/10 again explained to patient that if he was going to improve he was going to have to allow [place] to perform therapies to improve his respiratory status, some of which were not going to be comfortable.  Patient will not allow a number of therapies to be performed.  Son at bedside for discussion of this plan of care.  Patient and son agreed palliative care needed to be involved -11/10 palliative care consulted; believe patient is having a hard time deciding on what his goals of care are, and understanding that he is in the terminal phase of his  disease process.   DVT prophylaxis: SCD Code Status: Full Family Communication: 11/10 son at bedside/daughter on the phone for discussion of plan of care answered all questions. Status is: Inpatient    Dispo: The patient is from: Home              Anticipated d/c is to: SNF vs hospice              Anticipated d/c date is:??              Patient currently extremely unstable      Consultants:  Neurology   Procedures/Significant Events:  11/10 PCXR;Chronic accentuation of perihilar markings with RIGHT upper lobe volume loss/scarring likely related to prior cancer treatment. -Increased elevation of LEFT diaphragm.   I have personally reviewed and interpreted all radiology studies and my findings are as above.  VENTILATOR SETTINGS: Nonrebreather 11/11 FiO2 100% Flow 15 L/min SPO2 100%   Cultures 11/10 sputum pending   Antimicrobials: Anti-infectives (From admission, onward)   Start     Ordered Stop   12/12/19 2200  ceFEPIme (MAXIPIME) 2 g in sodium chloride 0.9 % 100 mL IVPB        12/12/19 1953     12/12/19 0000  amoxicillin-clavulanate (AUGMENTIN) 875-125 MG tablet        12/12/19 1128     12/10/19 1415  amoxicillin-clavulanate (AUGMENTIN) 875-125 MG per tablet 1 tablet  Status:  Discontinued        12/10/19 1319 12/12/19 1953   12/07/19 1500  Ampicillin-Sulbactam (UNASYN) 3 g in sodium chloride 0.9 % 100 mL IVPB  Status:  Discontinued        12/07/19 1404 12/10/19 1319   11/28/19 0000  ceFAZolin (ANCEF) IVPB 2g/100 mL premix  Status:  Discontinued        11/27/19 1730 11/28/19 0857   11/28/19 0000  ceFAZolin (ANCEF) IVPB 2g/100 mL premix  Status:  Discontinued        11/27/19 1730 11/27/19 1733   11/25/19 1530  Ampicillin-Sulbactam (UNASYN) 3 g in sodium chloride 0.9 % 100 mL IVPB        11/25/19 1432 12/02/19 2359       Devices    LINES / TUBES:      Continuous Infusions:    Objective: Vitals:   12/13/19 0400 12/13/19 0425 12/13/19 0550  12/13/19 0817  BP: 99/68 101/71 (!) 124/92 110/81  Pulse: (!) 106 (!) 107 (!) 102 (!) 111  Resp: 13 12 19 15   Temp: 98.1 F (36.7 C) 98.2 F (36.8 C) 98.3 F (36.8 C) (!) 97.5 F (36.4 C)  TempSrc: Axillary Axillary Axillary Axillary  SpO2: 100% 100% 98% 100%  Weight:      Height:        Intake/Output Summary (Last 24 hours) at 12/13/2019 1102 Last data filed at 12/13/2019 0830 Gross per 24 hour  Intake --  Output 1350 ml  Net -1350 ml   Filed Weights   12/11/19 0500 12/12/19 0500 12/12/19 2000  Weight: 44.6 kg 43.8 kg 42.6 kg    Examination:  11/11 patient and children have decided on comfort care.     Data Reviewed: Care during the described  time interval was provided by me .  I have reviewed this patient's available data, including medical history, events of note, physical examination, and all test results as part of my evaluation.  CBC: Recent Labs  Lab 12/07/19 0038 12/08/19 0045 12/09/19 0322 12/09/19 0322 12/10/19 0272 12/10/19 0649 12/11/19 0348 12/12/19 0031 12/12/19 1522 12/13/19 0111 12/13/19 0500  WBC 12.3*   < > 13.5*   < > 13.1*  --  12.9* 15.7* 21.7*  --  21.6*  NEUTROABS 10.7*  --  11.9*  --  11.4*  --  11.4*  --  19.9*  --   --   HGB 6.6*   < > 10.7*   < > 10.9*   < > 10.7* 11.6* 12.3* 12.2* 10.9*  HCT 21.0*   < > 32.6*   < > 34.1*   < > 33.6* 37.1* 39.8 36.0* 35.0*  MCV 89.0   < > 85.6   < > 86.3  --  88.4 87.5 88.1  --  88.2  PLT 511*   < > 491*   < > 467*  --  435* 427* 441*  --  348   < > = values in this interval not displayed.   Basic Metabolic Panel: Recent Labs  Lab 12/09/19 0322 12/09/19 0322 12/10/19 0649 12/11/19 0348 12/12/19 1522 12/13/19 0111 12/13/19 0500  NA 136   < > 140 140 136 137 138  K 4.0   < > 3.8 4.1 4.1 4.7 4.9  CL 102  --  104 104 98  --  102  CO2 27  --  27 29 27   --  27  GLUCOSE 109*  --  114* 147* 142*  --  139*  BUN 10  --  9 11 12   --  14  CREATININE 0.55*  --  0.64 0.59* 0.66  --  0.61  CALCIUM  8.3*  --  8.2* 8.4* 8.7*  --  8.6*  MG  --   --   --   --  2.0  --  2.0  PHOS  --   --   --   --   --   --  4.4   < > = values in this interval not displayed.   GFR: Estimated Creatinine Clearance: 53.3 mL/min (by C-G formula based on SCr of 0.61 mg/dL). Liver Function Tests: Recent Labs  Lab 12/12/19 1522 12/13/19 0500  AST 25 18  ALT 43 32  ALKPHOS 102 86  BILITOT 0.6 0.5  PROT 5.7* 5.5*  ALBUMIN 2.5* 2.4*   No results for input(s): LIPASE, AMYLASE in the last 168 hours. No results for input(s): AMMONIA in the last 168 hours. Coagulation Profile: No results for input(s): INR, PROTIME in the last 168 hours. Cardiac Enzymes: No results for input(s): CKTOTAL, CKMB, CKMBINDEX, TROPONINI in the last 168 hours. BNP (last 3 results) No results for input(s): PROBNP in the last 8760 hours. HbA1C: No results for input(s): HGBA1C in the last 72 hours. CBG: Recent Labs  Lab 12/12/19 0809 12/12/19 1148 12/12/19 1636 12/12/19 2013 12/13/19 0818  GLUCAP 115* 119* 117* 122* 124*   Lipid Profile: No results for input(s): CHOL, HDL, LDLCALC, TRIG, CHOLHDL, LDLDIRECT in the last 72 hours. Thyroid Function Tests: No results for input(s): TSH, T4TOTAL, FREET4, T3FREE, THYROIDAB in the last 72 hours. Anemia Panel: No results for input(s): VITAMINB12, FOLATE, FERRITIN, TIBC, IRON, RETICCTPCT in the last 72 hours. Sepsis Labs: Recent Labs  Lab 12/12/19 1522 12/12/19 2113 12/13/19  0500  PROCALCITON <0.10  --   --   LATICACIDVEN 2.2* 2.4* 1.3    Recent Results (from the past 240 hour(s))  Respiratory Panel by RT PCR (Flu A&B, Covid) - Nasopharyngeal Swab     Status: None   Collection Time: 12/10/19  4:19 PM   Specimen: Nasopharyngeal Swab  Result Value Ref Range Status   SARS Coronavirus 2 by RT PCR NEGATIVE NEGATIVE Final    Comment: (NOTE) SARS-CoV-2 target nucleic acids are NOT DETECTED.  The SARS-CoV-2 RNA is generally detectable in upper respiratoy specimens during the  acute phase of infection. The lowest concentration of SARS-CoV-2 viral copies this assay can detect is 131 copies/mL. A negative result does not preclude SARS-Cov-2 infection and should not be used as the sole basis for treatment or other patient management decisions. A negative result may occur with  improper specimen collection/handling, submission of specimen other than nasopharyngeal swab, presence of viral mutation(s) within the areas targeted by this assay, and inadequate number of viral copies (<131 copies/mL). A negative result must be combined with clinical observations, patient history, and epidemiological information. The expected result is Negative.  Fact Sheet for Patients:  PinkCheek.be  Fact Sheet for Healthcare Providers:  GravelBags.it  This test is no t yet approved or cleared by the [place] FDA and  has been authorized for detection and/or diagnosis of SARS-CoV-2 by FDA under an Emergency Use Authorization (EUA). This EUA will remain  in effect (meaning this test can be used) for the duration of the COVID-19 declaration under Section 564(b)(1) of the Act, 21 U.S.C. section 360bbb-3(b)(1), unless the authorization is terminated or revoked sooner.     Influenza A by PCR NEGATIVE NEGATIVE Final   Influenza B by PCR NEGATIVE NEGATIVE Final    Comment: (NOTE) The Xpert Xpress SARS-CoV-2/FLU/RSV assay is intended as an aid in  the diagnosis of influenza from Nasopharyngeal swab specimens and  should not be used as a sole basis for treatment. Nasal washings and  aspirates are unacceptable for Xpert Xpress SARS-CoV-2/FLU/RSV  testing.  Fact Sheet for Patients: PinkCheek.be  Fact Sheet for Healthcare Providers: GravelBags.it  This test is not yet approved or cleared by the [place] FDA and  has been authorized for detection and/or diagnosis  of SARS-CoV-2 by  FDA under an Emergency Use Authorization (EUA). This EUA will remain  in effect (meaning this test can be used) for the duration of the  Covid-19 declaration under Section 564(b)(1) of the Act, 21  U.S.C. section 360bbb-3(b)(1), unless the authorization is  terminated or revoked. Performed at [facility], [place] [address], [place], [place] [address]          Radiology Studies: DG CHEST PORT 1 VIEW  Result Date: 12/13/2019 CLINICAL DATA:  Respiratory distress EXAM: PORTABLE CHEST 1 VIEW COMPARISON:  12/12/2019 FINDINGS: Elevation of the left hemidiaphragm and left-sided volume loss persists. There is resultant vascular crowding within the left lung. Right lung is clear. No pneumothorax or pleural effusion. Right carotid stent again noted. Cardiac size within normal limits. Paramediastinal soft tissue may relate to prior radiation therapy and is stable. Stable superior retraction of the hila bilaterally. No acute bone abnormality. IMPRESSION: Stable examination with probable paramediastinal post radiation changes at the thoracic inlet and elevation of the left hemidiaphragm. No acute abnormality identified. Electronically Signed   By: Fidela Salisbury MD   On: 12/13/2019 01:21   DG CHEST PORT 1 VIEW  Result Date: 12/12/2019 CLINICAL DATA:  Shortness of breath; history lung cancer EXAM: PORTABLE CHEST 1 VIEW COMPARISON:  12/07/2019 FINDINGS: Normal heart size and pulmonary vascularity. RIGHT upper lobe volume loss and scarring likely related to lung cancer therapy, stable. Accentuated perihilar markings again seen. Elevation of LEFT diaphragm, slightly increased. No definite acute infiltrate, pleural effusion or pneumothorax. RIGHT carotid stent. IMPRESSION: Chronic accentuation of perihilar markings with RIGHT upper lobe volume loss/scarring likely related to prior cancer treatment. Increased elevation of LEFT diaphragm. No additional acute abnormalities. Electronically  Signed   By: Lavonia Dana M.D.   On: 12/12/2019 14:56        Scheduled Meds: .  stroke: mapping our early stages of recovery book   Does not apply Once  . arformoterol  15 mcg Nebulization BID  . budesonide (PULMICORT) nebulizer solution  0.25 mg Nebulization BID  . chlorhexidine  15 mL Mouth/Throat Once  . Chlorhexidine Gluconate Cloth  6 each Topical Daily  . glycopyrrolate  0.4 mg Intravenous QID  . methylPREDNISolone (SOLU-MEDROL) injection  60 mg Intravenous Q12H  . nicotine  14 mg Transdermal Daily  . revefenacin  175 mcg Nebulization Daily   Continuous Infusions:    LOS: 22 days    Time spent: 75 min    Phinley Schall, Geraldo Docker, MD [facility] Pager [phone]  If 7PM-7AM, please contact night-coverage [email or web address] Password [email or web address] 12/13/2019, 11:02 AM

## 2019-12-13 NOTE — Significant Event (Addendum)
Rapid Response Event Note   Reason for Call : Acute desaturation (sats 60s) and respiratory distress, decreased LOC (after Ativan)  Initial Focused Assessment:  Roselyn Reef RN notified me of pt in respiratory distress with sats in the 60s. Pt recently returned from CT scan (r/o PE) and was agitated. He received Ativan 0.5mg  IV. Pt then became hypoxic with sats in the 60s. RT at bedside and was NT suctioning pt upon my arrival. Copious purulent secretions removed during multiple successful passes with the catheter by RT.   Pt is responsive only to noxious stimuli, labored breathing with retractions and accessory muscle use.  BBS Coarse Rhonchi t/o.  Skin is warm, pink and dry.   0010- HR 118 ST, 91/73 (81), RR 18 with sats 100% on NRB mask at 15L.    Interventions:  -NTS sxn per RT -Pt placed on 100% NRB at [place] of Care:  -Aspiration precautions -Notify NOK for clinical change and possible change in Level of care (done by Roselyn Reef RN) -TRH consulted PCCM for evaluation -Notify RRRN for any further assistance and PCCM arrival  Addendum-PCCM came to bedside. Romazicon 0.2mg  x2 doses given. Pt now arousable to voice and answering limited questions.    MD Notified: [id number] Klein NP notified by Rennie Natter Call Time: 2332 Arrival Time: 2335 End Time: 0036  Madelynn Done, RN
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
## 2019-12-13 NOTE — Progress Notes (Signed)
OT Cancellation Note  Patient Details Name: Evan [name] MRN: [id number] DOB: 14-Feb-1950   Cancelled Treatment:    Reason Eval/Treat Not Completed: Other (comment) Pt now palliative and services have been discontinued, occupational therapy signing off at time. It was a pleasure to be a part of Mr. Wagley care team.  Corinne Ports E. Damita Eppard, COTA/L Acute Rehabilitation Services [place] 12/13/2019, 10:50 AM

## 2019-12-13 NOTE — Progress Notes (Signed)
SLP Cancellation Note  Patient Details Name: BHAVIN MONJARAZ MRN: [id number] DOB: 1951/01/09   Cancelled treatment: SLP orders discontinued per Palliative care.  We will respectfully sign off.  Gracielynn Birkel L. Tivis Ringer, [place] Office number [phone] Pager [phone]            Juan Quam Laurice 12/13/2019, 10:47 AM

## 2019-12-13 NOTE — Progress Notes (Signed)
This chaplain is present for F/U spiritual care.  The Pt. appears to be more comfortable this afternoon.  The Pt. daughter-Evan [name] is bedside and communicating with the Pt.  Evan [name] ordered the Pt. a few ice chips and a taste of something sweet ([place] Ice). The invitation for personal prayer and continued spiritual care was accepted.

## 2019-12-13 NOTE — Progress Notes (Signed)
   12/13/19 0031  Assess: MEWS Score  Temp 98.2 F (36.8 C)  BP (!) 89/69  Pulse Rate (!) 117  ECG Heart Rate (!) 116  Resp 15  Level of Consciousness Responds to Pain  SpO2 100 %  O2 Device Non-rebreather Mask  Patient Activity (if Appropriate) In bed  O2 Flow Rate (L/min) 15 L/min  FiO2 (%) 100 %  Assess: MEWS Score  MEWS Temp 0  MEWS Systolic 1  MEWS Pulse 2  MEWS RR 0  MEWS LOC 2  MEWS Score 5  MEWS Score Color Red  Assess: if the MEWS score is Yellow or Red  Were vital signs taken at a resting state? Yes  Focused Assessment Change from prior assessment (see assessment flowsheet)  Early Detection of Sepsis Score *See Row Information* Medium  Treat  MEWS Interventions Escalated (See documentation below)  Interventions  (NTS suction by RRT)  Neuro symptoms relieved by Rest  Take Vital Signs  Increase Vital Sign Frequency  Red: Q 1hr X 4 then Q 4hr X 4, if remains red, continue Q 4hrs  Escalate  MEWS: Escalate Red: discuss with charge nurse/RN and provider, consider discussing with RRT  Notify: Charge Nurse/RN  Name of Charge Nurse/RN Notified Daja, RN  Date Charge Nurse/RN Notified 12/12/19  Time Charge Nurse/RN Notified 2345  Notify: Provider  Provider Name/Title  (Dr. Edwyna Shell)  Date Provider Notified 12/12/19  Time Provider Notified 2345  Notification Type Page  Notification Reason Change in status  Response See new orders  Date of Provider Response 12/13/19  Time of Provider Response 0001  Notify: Rapid Response  Name of Rapid Response RN Notified Shanon Brow, RN  Date Rapid Response Notified 12/12/19  Time Rapid Response Notified 2345  Document  Patient Outcome Stabilized after interventions (patient made DNR per family)  Progress note created (see row info) Yes

## 2019-12-13 NOTE — Progress Notes (Signed)
Upon RT arrival to pt room to check in on pt, daughter at bedside visiting. Pt founds to be on 10L regular nasal cannula. Pt and daughter informed RT was going to titrate oxygen to make it more comfortable to wear, both in verbalize understanding. Pt titrated to 5L nasal cannula with humidity. Pt voices that this feels much more comfortable. RT will continue to monitor and be available as needed.

## 2019-12-13 NOTE — Progress Notes (Signed)
Palliative Medicine RN Note: Afternoon symptom check.  Evan [name] is resting quietly, without labored breathing; he remains on NRB. No audible secretions. Daughter and son at bedside. I did not attempt to wake him, and he did not respond when I said his name in conversation. He has not rec'd any morphine since my previous visit.  I spoke with his family about how happy I am. to see the change since this morning. His daughter asked about changing him to [place] from NRB, as he was complaining of dry mouth and doesn't like wearing the mask. I spoke with the RN; he has a HFNC in the room, which will allow [place] to transition him to 12L [place] and titrate down, hopefully to room air. His family is hesitant to go too low on O2 if it will cause him to need a lot of morphine, but I assured them we can put the NRB back on if necessary. However, I feel like he will be able to have much more dignity with a HFNC with 12LPM O2 and good mouth care. I encouraged chips/sips of his favorite beverages; he's been craving sweets, and he can have small tastes of popsicles or ice cream, if he wants them.  I also reinforced that our goal is to provide dignity and comfort and to treat Evan [name] as a person, as well as to do things for him, not to him. I have concerns that Evan [name] prognosis is very short, based on the changes in his respiratory status in the last 24 hours and the rapid onset and escalation of terminal secretions, which can be a very poor prognostic indicator. I discussed my concerns with Wadie Lessen, NP.   Plan to follow up tomorrow morning to ensure symptoms remain managed.  Marjie Skiff Rommie Dunn, RN, BSN, [facility] Palliative Medicine Team 12/13/2019 3:34 PM Office [phone]

## 2019-12-13 NOTE — Progress Notes (Signed)
Patient ID: Evan [name], male   DOB: [birth year], 69 y.o.   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  Status post carotid stent placements  Patient has had a complex clinical course and today is day 20 one of his hospitalization. Continued physical and functional decline over the past several weeks.  Overall failure to thrive.  This NP reviewed medical records, spoke to medical team and then spoke to daughter and son  by telephone regarding the physical changes and decline in patient's condition  over the last several hours.  Patient's daughter verbalizes an understanding of the seriousness of the current medical situation and her father's limited prognosis.  Both son and daughter desire focus of care to be on comfort and dignity at this time.  Education offered on the natural trajectory and expectations at end of life.  Questions and concerns addressed.  Plan of care: - DNR/DNI -No artificial feeding or hydration now or in the future -No further diagnostics -Symptom management to enhance comfort specific to air hunger, pain, agitation and terminal secretions.        -Morphine, Ativan, Rubinol -Re-assess in 24 hours for possible transition to residential hospice   Questions and concerns addressed   Discussed with Dr Sherral Hammers and Larina Bras RN and bedside RN  Total time spent on the unit was 35 minutes  Greater than 50% of the time was spent in counseling and coordination of care  Wadie Lessen NP  Palliative Medicine Team Team Phone # [phone] Pager [phone]

## 2019-12-13 NOTE — Progress Notes (Signed)
ANTICOAGULATION CONSULT NOTE   Pharmacy Consult:  IV Heparin Indication:  Pumonary Embolism  Patient Measurements: Height: 5\' 5"  (165.1 cm) Weight: 42.6 kg (93 lb 14.7 oz) IBW/kg (Calculated) : 61.5 Heparin Dosing Weight: 42.6 kg  Vital Signs: Temp: 98.3 F (36.8 C) (11/11 0550) Temp Source: Axillary (11/11 0550) BP: 124/92 (11/11 0550) Pulse Rate: 102 (11/11 0550)  Labs: Recent Labs    12/11/19 0348 12/11/19 0348 12/12/19 0031 12/12/19 0031 12/12/19 1522 12/12/19 1522 12/13/19 0111 12/13/19 0500  HGB 10.7*   < > 11.6*   < > 12.3*   < > 12.2* 10.9*  HCT 33.6*   < > 37.1*   < > 39.8  --  36.0* 35.0*  PLT 435*   < > 427*  --  441*  --   --  348  HEPARINUNFRC  --   --   --   --   --   --   --  0.23*  CREATININE 0.59*  --   --   --  0.66  --   --  0.61   < > = values in this interval not displayed.    Estimated Creatinine Clearance: 53.3 mL/min (by C-G formula based on SCr of 0.61 mg/dL).  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  Pt was on IV heparin earlier this admission; heparin was discontinued on 11/6, due to hematuria. Pt having acute respiratory decompensation, D-dimer >20.0. Pharmacy was consulted to restart heparin for PE treatment. Due to bleeding hx, will target low end of heparin goal range (0.3-0.5 units/ml).  Heparin level slightly subtherapeutic (0.23) on gtt at 950 units/hr. No issues with line per RN. They are having to draw labs from same arm as heparin infusing so holding heparin for about 10 min pre-draw and flushing. Pt continues with hematuria, was pink-tinged, now red. [facility] and CCM aware of this and want heparin continued for now. Hgb remains stable.  Goal of Therapy:  Heparin level 0.3-0.5 units/ml Monitor platelets by anticoagulation protocol: Yes   Plan:  Increase heparin infusion to 1000 units/hr Check 6-hr heparin level  Sherlon Handing, PharmD, BCPS Please  see amion for complete clinical pharmacist phone list 12/13/2019 6:02 AM

## 2019-12-13 NOTE — Progress Notes (Signed)
Patient returned from CT very anxious.  RN had called Dr. Stark Klein to let her know that we were unable to obtain the CT ordered.  Notified her that pt was anxious and got a verbal order to administer IV ativan and wrap the patient's iv to preserve the site.  Ativan was administered and patient was still flailing in the bed and unable to follow commands.  RN stayed with patient to continue monitoring.  Pt's heart rate continued to escalate and oxygen rate continued to decline to the 60's.  RN placed pt back on nonrebreather at 15Liters and paged Rapid at 23:32.  RRT was in the hallway when staff went to get code cart and RRT assumed treatment of the patient at that time with primary RN as support.  Patient was NTS aggressively and eventually patient oxygen resumed to 100% on 15 liters nonrebreather with RR of 16 and HR of 119.  Dr. Stark Klein was notified of situation and will look into consulting critical care.  Patient responsive to noxious stimuli at this point.  Emergency contact, Vania Rea (son) was notified by RN to make aware of change in status and verify valid contact information at this time.

## 2019-12-13 NOTE — Progress Notes (Signed)
Upon arrival to pt room for routine aerosol treatment, pt with audible crackles. Pt nasotracheally suctioned with assistance of RN. Minimal secretions obtained, pt tolerated fair. Pt refusing to let staff continue to NTS. RT will continue to monitor and be available if needed.

## 2019-12-13 NOTE — Progress Notes (Signed)
Palliative Medicine RN Note: Symptom check after being made comfort care by Wadie Lessen.   He is alone in the room. Using chest, stomach, shoulders to breathe. NRB loosely on his face. Copious terminal secretions audible from outside the room. He has not rec'd any morphine since 0948.   When I introduced myself, Mr Pepper turned his face to me & opened his eyes. He reports his mouth is dry & that he feels hot. He mouthed "no" when I attempted to use a [place]. Son arrived during my visit, and we turned the temperature down in the room & discussed the use of cool, damp washcloths in the fan stream, on the forehead to promote evaporative cooling.   Patient's RN was unavailable, but another RN was available to give a dose of prn morphine to support comfort. PMT Chaplain Dorian Pod arrived to provide spiritual support. I will return this afternoon to ensure pt is comfortable.  Marjie Skiff Bea Duren, RN, BSN, [facility] Palliative Medicine Team 12/13/2019 12:36 PM Office [phone]

## 2019-12-14 DIAGNOSIS — Z7189 Other specified counseling: Secondary | ICD-10-CM

## 2019-12-14 DIAGNOSIS — I639 Cerebral infarction, unspecified: Secondary | ICD-10-CM

## 2019-12-14 MED ORDER — DIAZEPAM 5 MG/ML IJ SOLN
1.0000 mg | Freq: Three times a day (TID) | INTRAMUSCULAR | Status: DC | PRN
  Administered 2019-12-15 – 2019-12-16 (×3): 2.5 mg via INTRAVENOUS
  Filled 2019-12-14 (×3): qty 2

## 2019-12-14 MED ORDER — LORAZEPAM 2 MG/ML IJ SOLN
0.2500 mg | INTRAMUSCULAR | Status: DC | PRN
  Filled 2019-12-14: qty 1

## 2019-12-14 MED ORDER — HYDROXYZINE HCL 25 MG PO TABS
25.0000 mg | ORAL_TABLET | ORAL | Status: DC | PRN
  Administered 2019-12-14: 25 mg via ORAL
  Filled 2019-12-14: qty 1

## 2019-12-14 MED ORDER — MORPHINE SULFATE (PF) 4 MG/ML IV SOLN
4.0000 mg | INTRAVENOUS | Status: DC | PRN
  Administered 2019-12-14 – 2019-12-16 (×3): 4 mg via INTRAVENOUS
  Filled 2019-12-14 (×3): qty 1

## 2019-12-14 MED ORDER — FUROSEMIDE 10 MG/ML IJ SOLN
40.0000 mg | Freq: Once | INTRAMUSCULAR | Status: AC
  Administered 2019-12-14: 40 mg via INTRAVENOUS
  Filled 2019-12-14: qty 4

## 2019-12-14 NOTE — Progress Notes (Signed)
PROGRESS NOTE    Evan [name]  EHU:[id number] DOB: April 24, 1950 DOA: 11/21/2019 PCP: Patient, No Pcp Per     Brief Narrative:  Evan [name] is a 69 y.o. 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  Patient presented to the ED on 10/20 with significant confusion, weakness for 24 hours.  He was found to have CVA with carotid stenosis. Carotid stenting was recommended but was delayed because of his poor respiratory status and poor nutritional status.  Core track feeding was started to augment nutrition while pending carotid stenting. Respiratory status subsequently improved and he underwent right carotid stenting on 11/4.  Postprocedure, his hospital course was complicated with low blood pressure, drop in hemoglobin, altered mental status, poor cough reflex, persistent gurgling of secretions and aspiration pneumonia. He has overall improved now and is currently pending placement.    Subjective: 11/12 A/O x4, states his anxiety is better controlled.  Continue increased WOB.  Son and daughter at bedside.   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  -Imaging showed acute ischemic infarct involving the left temporoccipital region, -LDL 154, A1c 6.3. Echocardiogram EF 55 to 60%. CTA head and neck did not show LVO but showed severe bilateral carotid artery stenosis therefore IR consulted for stent placement. -11/4, underwent right carotid artery stenting by neuro IR.  -Because of coexisting DVT, patient was started on heparin drip and Brilinta. However heparin drip had to be held on 11/6 because of hematuria. -11/10 anticoagulation not restarted.  See goals of care -11/11 patient and children have decided on comfort care  Acute on chronic respiratory failure with hypoxia/COPD/stage III lung cancer -Per patient on 2 L O2 at home prior to admission -11/10 currently on 2 l O2 which is his home regimen. -Patient and son understand patient is to schedule follow-up with PCCM when it gets close to time for release from SNF -Patient and son understand patient is to schedule follow-up with Dr. Julien Nordmann oncology when he gets close time for release from SNF -11/10 ADDENDUM needs to bedside prior to discharge patient in acute respiratory distress with hypoxia.  Tachypneic, tachycardic 11/11 patient and children have decided on comfort care  Severe sepsis/ HCAP -11/10 patient meets criteria for severe sepsis WBC> 12, RR> 20, HR> 90, area of infection lungs.  Lactic acid> 2.2 -11/10 Albumin 25 g -11/10 DC Augmentin, start Cefepime -DuoNeb QID -Solu-Medrol 60 mg BID -11/10 DC scopolamine patch making it extremely difficult for patient to clear sputum -Morphine PRN -CTA chest PE protocol pending 11/11 patient and children have decided on comfort care  Chronic gurgling throat sound -During this  hospitalization, patient had bilateral diffuse coarse breath sounds from aspiration pneumonia and upper airway secretion.  -He states that for several months, patient is  having gurgling sound in his throat. He reports that Dr. Julien Nordmann, his oncologist suspects it is secondary to scar tissue from cancer. Patient does not feel any pain or distress related to that. -Patient has not required more than 2 to 3 L of oxygen throughout this stay. -Titrate O2 to maintain SPO2> 88% -Complete course of Augmentin last dose 11/15 -Continue a tapering course of steroids. -Also continue bronchodilators, scopolamine. -11/10 patient allowed some NTS to be performed, thick yellow mucus obtained.  Patient would not allow a thorough NTS to be performed. -11/10 per patient and patient's son throat gurgling is NOT CHRONIC, but started when patient diagnosed with pneumonia. -See goals of care  Acute delirium/Acute metabolic encephalopathy -Multifactorial: Prolonged hospitalization, stroke, hypoxia. -11/10 resolved patient A/O x4 -Seroquel 25 mg twice daily.   11/11 patient and children have decided on comfort care  Hypotension -Maintain MAP> 65 -See severe sepsis  Acute DVT of left upper extremity/Chronicleft IJ thrombosis -CT angio of neck from 10/20 showed chronic findings suspicious for left IJ thrombosis but at the time, patient did not have any swelling of the left upper extremity.  -11/5, patient was noted to have worsening left upper extremity swelling and hence ultrasound duplex was obtained which showed an acute DVT involving the left internal jugular vein, left subclavian vein, left jugular vein, left brachial veins and left basilic vein. -Patient has been started on heparin drip but discontinued secondary to acute blood loss anemia and hematuria. -11/10 D-dimer> 20, given patient's acute respiratory desaturation concerning for PE -Empirically restart heparin drip maintain at low end of therapeutic level.  Await CTA PE protocol findings 11/11 patient and children have decided on comfort care  Acute blood loss anemia/Hematuria -On11/5, hemoglobin dropped down  to the lowest of 6.6.  -11/5 transfuse 2 units PRBC  -CT abdomen pelvis did not show any evidence of retroperitoneal bleed. 11/6, patienthad to be started on heparin drip for left upper external DVT but he started having hematuria again.  -Heparin drip was stopped. Brilinta +Aaspirin 81 mg daily for stroke prevention continued. 11/11 patient and children have decided on comfort care  Severe protein calorie malnutrition -10/29 - 11/5 -patient was fed through core track because of poor nutritional status prior to surgery. -Currently on oral feeding. 11/11 patient and children have decided on comfort care  Chronic urinary retention -Patient admitted with indwelling Foley catheter> 2 days old.  So was removed -11/10 increaseTamsulosin 0.4 mg BID -11/10 reinsert Foley catheter 11/11 patient and children have decided on comfort care   Goals of care -Spoke at length with patient, son, and daughter on the phone.  Explained that patient was at significant risk for future PE, DVT given that he had cancer and a current DVT.  Stated that we could keep patient overnight and restart anticoagulation and determine if he has acute bleed again, if he does not would discharge on anticoagulant.  Patient stated he understood that he could develop a clot which could be terminal but that he wanted to be discharged to SNF today.  Patient's children stated they would honor their father's wishes. ADDENDUM 11/10 again explained to patient that if he was going to improve he was going to have to allow [place] to perform therapies to improve his respiratory status, some of which were not going to be comfortable.  Patient will not  allow a number of therapies to be performed.  Son at bedside for discussion of this plan of care.  Patient and son agreed palliative care needed to be involved -11/10 palliative care consulted; believe patient is having a hard time deciding on what his goals of care are, and understanding that he  is in the terminal phase of his disease process. -11/12 spoke with son and daughter who are both at bedside family and patient understand plan is to get him to [facility] ASAP   DVT prophylaxis: SCD Code Status: Full Family Communication: See goals of care Status is: Inpatient    Dispo: The patient is from: Home              Anticipated d/c is to: SNF vs hospice              Anticipated d/c date is:??              Patient currently extremely unstable      Consultants:  Neurology   Procedures/Significant Events:  11/10 PCXR;Chronic accentuation of perihilar markings with RIGHT upper lobe volume loss/scarring likely related to prior cancer treatment. -Increased elevation of LEFT diaphragm.   I have personally reviewed and interpreted all radiology studies and my findings are as above.  VENTILATOR SETTINGS: Nasal cannula 11/12 Flow 5 L/min SPO2 91%   Cultures 11/10 sputum pending   Antimicrobials: Anti-infectives (From admission, onward)   Start     Ordered Stop   12/12/19 2200  ceFEPIme (MAXIPIME) 2 g in sodium chloride 0.9 % 100 mL IVPB  Status:  Discontinued        12/12/19 1953 12/13/19 1037   12/12/19 0000  amoxicillin-clavulanate (AUGMENTIN) 875-125 MG tablet        12/12/19 1128     12/10/19 1415  amoxicillin-clavulanate (AUGMENTIN) 875-125 MG per tablet 1 tablet  Status:  Discontinued        12/10/19 1319 12/12/19 1953   12/07/19 1500  Ampicillin-Sulbactam (UNASYN) 3 g in sodium chloride 0.9 % 100 mL IVPB  Status:  Discontinued        12/07/19 1404 12/10/19 1319   11/28/19 0000  ceFAZolin (ANCEF) IVPB 2g/100 mL premix  Status:  Discontinued        11/27/19 1730 11/28/19 0857   11/28/19 0000  ceFAZolin (ANCEF) IVPB 2g/100 mL premix  Status:  Discontinued        11/27/19 1730 11/27/19 1733   11/25/19 1530  Ampicillin-Sulbactam (UNASYN) 3 g in sodium chloride 0.9 % 100 mL IVPB        11/25/19 1432 12/02/19 2359       Devices    LINES / TUBES:       Continuous Infusions:    Objective: Vitals:   12/13/19 1637 12/13/19 2001 12/13/19 2022 12/14/19 0750  BP:   101/68 115/82  Pulse:  (!) 108 (!) 112 (!) 108  Resp:  13 18 (!) 24  Temp:   (!) 97 F (36.1 C) (!) 97.4 F (36.3 C)  TempSrc:   Axillary Axillary  SpO2: (!) 86% 93% (!) 84% 96%  Weight:      Height:        Intake/Output Summary (Last 24 hours) at 12/14/2019 0847 Last data filed at 12/14/2019 0500 Gross per 24 hour  Intake --  Output 600 ml  Net -600 ml   Filed Weights   12/11/19 0500 12/12/19 0500 12/12/19 2000  Weight: 44.6 kg 43.8 kg 42.6 kg    Examination:  11/11 patient and children have decided on comfort care.     Data Reviewed: Care during the described time interval was provided by me .  I have reviewed this patient's available data, including medical history, events of note, physical examination, and all test results as part of my evaluation.  CBC: Recent Labs  Lab 12/09/19 0322 12/09/19 0322 12/10/19 0649 12/10/19 0649 12/11/19 0348 12/12/19 0031 12/12/19 1522 12/13/19 0111 12/13/19 0500  WBC 13.5*   < > 13.1*  --  12.9* 15.7* 21.7*  --  21.6*  NEUTROABS 11.9*  --  11.4*  --  11.4*  --  19.9*  --   --   HGB 10.7*   < > 10.9*   < > 10.7* 11.6* 12.3* 12.2* 10.9*  HCT 32.6*   < > 34.1*   < > 33.6* 37.1* 39.8 36.0* 35.0*  MCV 85.6   < > 86.3  --  88.4 87.5 88.1  --  88.2  PLT 491*   < > 467*  --  435* 427* 441*  --  348   < > = values in this interval not displayed.   Basic Metabolic Panel: Recent Labs  Lab 12/09/19 0322 12/09/19 0322 12/10/19 0649 12/11/19 0348 12/12/19 1522 12/13/19 0111 12/13/19 0500  NA 136   < > 140 140 136 137 138  K 4.0   < > 3.8 4.1 4.1 4.7 4.9  CL 102  --  104 104 98  --  102  CO2 27  --  27 29 27   --  27  GLUCOSE 109*  --  114* 147* 142*  --  139*  BUN 10  --  9 11 12   --  14  CREATININE 0.55*  --  0.64 0.59* 0.66  --  0.61  CALCIUM 8.3*  --  8.2* 8.4* 8.7*  --  8.6*  MG  --   --   --   --   2.0  --  2.0  PHOS  --   --   --   --   --   --  4.4   < > = values in this interval not displayed.   GFR: Estimated Creatinine Clearance: 53.3 mL/min (by C-G formula based on SCr of 0.61 mg/dL). Liver Function Tests: Recent Labs  Lab 12/12/19 1522 12/13/19 0500  AST 25 18  ALT 43 32  ALKPHOS 102 86  BILITOT 0.6 0.5  PROT 5.7* 5.5*  ALBUMIN 2.5* 2.4*   No results for input(s): LIPASE, AMYLASE in the last 168 hours. No results for input(s): AMMONIA in the last 168 hours. Coagulation Profile: No results for input(s): INR, PROTIME in the last 168 hours. Cardiac Enzymes: No results for input(s): CKTOTAL, CKMB, CKMBINDEX, TROPONINI in the last 168 hours. BNP (last 3 results) No results for input(s): PROBNP in the last 8760 hours. HbA1C: No results for input(s): HGBA1C in the last 72 hours. CBG: Recent Labs  Lab 12/12/19 0809 12/12/19 1148 12/12/19 1636 12/12/19 2013 12/13/19 0818  GLUCAP 115* 119* 117* 122* 124*   Lipid Profile: No results for input(s): CHOL, HDL, LDLCALC, TRIG, CHOLHDL, LDLDIRECT in the last 72 hours. Thyroid Function Tests: No results for input(s): TSH, T4TOTAL, FREET4, T3FREE, THYROIDAB in the last 72 hours. Anemia Panel: No results for input(s): VITAMINB12, FOLATE, FERRITIN, TIBC, IRON, RETICCTPCT in the last 72 hours. Sepsis Labs: Recent Labs  Lab 12/12/19 1522 12/12/19 2113 12/13/19 0500  PROCALCITON <0.10  --   --   LATICACIDVEN 2.2* 2.4*  1.3    Recent Results (from the past 240 hour(s))  Respiratory Panel by RT PCR (Flu A&B, Covid) - Nasopharyngeal Swab     Status: None   Collection Time: 12/10/19  4:19 PM   Specimen: Nasopharyngeal Swab  Result Value Ref Range Status   SARS Coronavirus 2 by RT PCR NEGATIVE NEGATIVE Final    Comment: (NOTE) SARS-CoV-2 target nucleic acids are NOT DETECTED.  The SARS-CoV-2 RNA is generally detectable in upper respiratoy specimens during the acute phase of infection. The lowest concentration of  SARS-CoV-2 viral copies this assay can detect is 131 copies/mL. A negative result does not preclude SARS-Cov-2 infection and should not be used as the sole basis for treatment or other patient management decisions. A negative result may occur with  improper specimen collection/handling, submission of specimen other than nasopharyngeal swab, presence of viral mutation(s) within the areas targeted by this assay, and inadequate number of viral copies (<131 copies/mL). A negative result must be combined with clinical observations, patient history, and epidemiological information. The expected result is Negative.  Fact Sheet for Patients:  PinkCheek.be  Fact Sheet for Healthcare Providers:  GravelBags.it  This test is no t yet approved or cleared by the [place] FDA and  has been authorized for detection and/or diagnosis of SARS-CoV-2 by FDA under an Emergency Use Authorization (EUA). This EUA will remain  in effect (meaning this test can be used) for the duration of the COVID-19 declaration under Section 564(b)(1) of the Act, 21 U.S.C. section 360bbb-3(b)(1), unless the authorization is terminated or revoked sooner.     Influenza A by PCR NEGATIVE NEGATIVE Final   Influenza B by PCR NEGATIVE NEGATIVE Final    Comment: (NOTE) The Xpert Xpress SARS-CoV-2/FLU/RSV assay is intended as an aid in  the diagnosis of influenza from Nasopharyngeal swab specimens and  should not be used as a sole basis for treatment. Nasal washings and  aspirates are unacceptable for Xpert Xpress SARS-CoV-2/FLU/RSV  testing.  Fact Sheet for Patients: PinkCheek.be  Fact Sheet for Healthcare Providers: GravelBags.it  This test is not yet approved or cleared by the [place] FDA and  has been authorized for detection and/or diagnosis of SARS-CoV-2 by  FDA under an Emergency Use  Authorization (EUA). This EUA will remain  in effect (meaning this test can be used) for the duration of the  Covid-19 declaration under Section 564(b)(1) of the Act, 21  U.S.C. section 360bbb-3(b)(1), unless the authorization is  terminated or revoked. Performed at [facility], [place] [address] [place], [place] [address]          Radiology Studies: DG CHEST PORT 1 VIEW  Result Date: 12/13/2019 CLINICAL DATA:  Respiratory distress EXAM: PORTABLE CHEST 1 VIEW COMPARISON:  12/12/2019 FINDINGS: Elevation of the left hemidiaphragm and left-sided volume loss persists. There is resultant vascular crowding within the left lung. Right lung is clear. No pneumothorax or pleural effusion. Right carotid stent again noted. Cardiac size within normal limits. Paramediastinal soft tissue may relate to prior radiation therapy and is stable. Stable superior retraction of the hila bilaterally. No acute bone abnormality. IMPRESSION: Stable examination with probable paramediastinal post radiation changes at the thoracic inlet and elevation of the left hemidiaphragm. No acute abnormality identified. Electronically Signed   By: Fidela Salisbury MD   On: 12/13/2019 01:21   DG CHEST PORT 1 VIEW  Result Date: 12/12/2019 CLINICAL DATA:  Shortness of breath; history lung cancer EXAM: PORTABLE CHEST 1 VIEW COMPARISON:  12/07/2019  FINDINGS: Normal heart size and pulmonary vascularity. RIGHT upper lobe volume loss and scarring likely related to lung cancer therapy, stable. Accentuated perihilar markings again seen. Elevation of LEFT diaphragm, slightly increased. No definite acute infiltrate, pleural effusion or pneumothorax. RIGHT carotid stent. IMPRESSION: Chronic accentuation of perihilar markings with RIGHT upper lobe volume loss/scarring likely related to prior cancer treatment. Increased elevation of LEFT diaphragm. No additional acute abnormalities. Electronically Signed   By: Lavonia Dana M.D.   On: 12/12/2019  14:56        Scheduled Meds: .  stroke: mapping our early stages of recovery book   Does not apply Once  . chlorhexidine  15 mL Mouth/Throat Once  . Chlorhexidine Gluconate Cloth  6 each Topical Daily  . glycopyrrolate  0.4 mg Intravenous QID  . nicotine  14 mg Transdermal Daily   Continuous Infusions:    LOS: 23 days    Time spent: 10 min    Carzell Saldivar, Geraldo Docker, MD [facility] Pager [phone]  If 7PM-7AM, please contact night-coverage [email or web address] Password [facility] 12/14/2019, 8:47 AM

## 2019-12-14 NOTE — Progress Notes (Signed)
RN went in to round on patient.  Patient's daughter and son in room providing emotional support for patient.  Patient not showing signs of pain at this time and family denied any medication needs.  Pt was repositioned in bed and family was feeding ice chips to patient.  Discussed with family possible transfer tonight to [place], patient did have bed pending but it was resended by staffing and  currently no open beds available at this time.  Family was relieved that patient would be staying on this unit for they are very happy with the care that he is receiving at this time and feels very comfortable with staff here.  Patient currently had no needs at this time and family had no additional questions so emotional support was given and RN will continue to monitor patient.  HR was 107, O2 stat 96, and RR 26.

## 2019-12-14 NOTE — Progress Notes (Signed)
Went into room to check on patient.  Daughter present at bedside and pt resting comfortable at this time.  HR was 108 and pt oxygen stat was 93% on 5L.  Pt was resting comfortably did trach me with his eyes but was nonverbal while at bedside.  Provided support and answered all questions presented by daughter.

## 2019-12-14 NOTE — Progress Notes (Signed)
Daily Progress Note   Patient Name: Evan [name]       Date: 12/14/2019 DOB: 10-17-1950  Age: 69 y.o. MRN#: [id number] Attending Physician: Allie Bossier, MD Primary Care Physician: Patient, No Pcp Per Admit Date: 11/21/2019  Reason for Consultation/Follow-up: Establishing goals of care  Subjective: Patient awake, answering some questions. Eating minimally. Appears very SOB. Agrees to morphine for symptom management.  Spoke with his daughter - plan is to request bed at [facility].  Symptom management discussed.   ROS  Length of Stay: 23  Current Medications: Scheduled Meds:  .  stroke: mapping our early stages of recovery book   Does not apply Once  . chlorhexidine  15 mL Mouth/Throat Once  . Chlorhexidine Gluconate Cloth  6 each Topical Daily  . glycopyrrolate  0.4 mg Intravenous QID  . nicotine  14 mg Transdermal Daily    Continuous Infusions:    PRN Meds: acetaminophen **OR** acetaminophen (TYLENOL) oral liquid 160 mg/5 mL **OR** acetaminophen, diazepam, morphine injection  Physical Exam Vitals and nursing note reviewed.  Constitutional:      Comments: Frail, weak  Pulmonary:     Comments: Increased WOB, audible secretions            Vital Signs: BP 115/82 (BP Location: Right Arm)   Pulse (!) 108   Temp (!) 97.4 F (36.3 C) (Axillary)   Resp (!) 24   Ht 5\' 5"  (1.651 m)   Wt 42.6 kg   SpO2 96%   BMI 15.63 kg/m  SpO2: SpO2: 96 % O2 Device: O2 Device: Nasal Cannula O2 Flow Rate: O2 Flow Rate (L/min): (S) 5 L/min (decreased from 10L )  Intake/output summary:   Intake/Output Summary (Last 24 hours) at 12/14/2019 1407 Last data filed at 12/14/2019 1041 Gross per 24 hour  Intake --  Output 1200 ml  Net -1200 ml   LBM: Last BM Date:  12/13/19 Baseline Weight: Weight: 48.1 kg Most recent weight: Weight: 42.6 kg       Palliative Assessment/Data: PPS: 20%   Flowsheet Rows      Most Recent Value  Intake Tab  Referral Department Hospitalist  Unit at Time of Referral Intermediate Care Unit  Date Notified 11/26/19  Palliative Care Type New Palliative care  Reason for referral Clarify Goals of Care  Date  of Admission 11/21/19  Date first seen by Palliative Care 11/28/19  # of days Palliative referral response time 2 Day(s)  # of days IP prior to Palliative referral 5  Clinical Assessment  Psychosocial & Spiritual Assessment  Palliative Care Outcomes       Patient Active Problem List   Diagnosis Date Noted  . Dyspnea   . Severe sepsis ([place]) 12/12/2019  . HCAP (healthcare-associated pneumonia) 12/12/2019  . Stenosis of right carotid artery 12/06/2019  . Weakness generalized   . Stenosis of artery ([place])   . Acute respiratory distress   . Palliative care by specialist   . Acute cerebrovascular accident (CVA) ([place]) 11/21/2019  . Hyperkalemia 11/21/2019  . Protein-calorie malnutrition, severe 11/17/2019  . Acute exacerbation of chronic obstructive pulmonary disease (COPD) ([place]) 11/15/2019  . Constipation 10/29/2019  . Asymptomatic bacteriuria 10/29/2019  . Pneumonia 10/28/2019  . Primary cancer of left lower lobe of lung ([place]) 04/06/2019  . Involuntary movements 08/24/2018  . Primary cancer of right middle lobe of lung ([place]) 07/16/2018  . Nodule of middle lobe of right lung 06/20/2018  . Bilateral carotid artery stenosis 02/18/2017  . Left renal mass 07/19/2016  . Phlebitis 02/04/2015  . Full code status 11/04/2014  . End of life care 11/04/2014  . 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 TOBACCO ABUSE 04/02/2008  . 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 Hyperlipidemia 02/09/2008  .  DEPRESSION 02/09/2008  . Essential hypertension 02/09/2008  . COPD (chronic obstructive pulmonary disease) ([place]) 02/09/2008  . HEADACHE 02/09/2008    Palliative Care Assessment & Plan   Patient Profile: 69 y/o M with Stage IIIa NSCLCA, COPD with recent admissions for pneumonia who has been admitted on 11/21/19 for confusion who has been found to have left temporoccipital infarct in setting of carotid artery stenosis.  Status post carotid stent placements. Overall failure to thrive. He was transitioned to full comfort measures only.   Assessment/Recommendations/Plan   TOC referral for [facility]  Increase IV morphine to 4mg  q1hr prn- he may eventually need infusion- but his daughter feels his primary issue is anxiety related to being in the hospital - it is important to him not to die in the hospital  D/C lorazepam per family request  Valium ordered by attending  Family understands that patient is at end of life and he may get to point that keeping him awake and keeping him comfortable will not be possible- however, for now the desire is to keep him awake and talking to family for as long as possible but also focus on comfort  Goals of Care and Additional Recommendations:  Limitations on Scope of Treatment: Full Comfort Care  Code Status:  DNR  Prognosis:   < 2 weeks  Discharge Planning:  Hospice facility  Care plan was discussed with patient's daughters.  Thank you for allowing the Palliative Medicine Team to assist in the care of this patient.   Total time: 39 mins Greater than 50%  of this time was spent counseling and coordinating care related to the above assessment and plan.  Mariana Kaufman, AGNP-C Palliative Medicine   Please contact Palliative Medicine Team phone at [phone] for questions and concerns.

## 2019-12-14 NOTE — Progress Notes (Addendum)
Manufacturing engineer [facility]) Hospital Liaison note.    *[id number]: Addendum: Spoke with son Vania Rea to conform interest..*  Received request for family interest in [facility] Chart and pt information under review by [facility] physician.  Hospice eligibility pending at this time.  [facility] is unable to offer a room today. Hospital Liaison will follow up tomorrow or sooner if a room becomes available. Please do not hesitate to call with questions.    Thank you for the opportunity to participate in this patient's care.  Chrislyn Edison Pace, BSN, RN [place] (listed on [place] under Hospice[facility])    [phone]

## 2019-12-15 NOTE — Progress Notes (Signed)
Manufacturing engineer [facility]) Hospital Liaison note.   Received request from [place] for family interest in [facility]. Chart and pt information under review by [facility] physician.  Hospice eligibility pending at this time.  [facility] is unable to offer a room today.  Spoke with son Vania Rea to make him aware. Hospital Liaison will follow up tomorrow or sooner if a room becomes available.    Please do not hesitate to call with questions. Thank you for the opportunity to participate in this patient's care.  Chrislyn Edison Pace, BSN, RN [place] (listed on [place] under [facility]ospice[facility])    [phone]

## 2019-12-15 NOTE — Progress Notes (Signed)
Pt transferred to [place] room 7, vital signs stable and all belongings transferred with pt.  Robinul and valium given to pt to help with breathing and secretions, pt resting comfortably with fan, RR even and unlabored.  Report given to 6N

## 2019-12-15 NOTE — Progress Notes (Signed)
Pt observed resting quietly with eyes closed. Constant cool air blowing on pt via fan. PT declined Robinul this AM and sated it dries him out too much. Nicotine patch placed on right arm. Pt denies any discomfort. RR even and unlabored. No auditory rhonchi noted. Call light within reach and safety measures intact.

## 2019-12-15 NOTE — Progress Notes (Signed)
PROGRESS NOTE    Evan [name]  OVF:[id number] DOB: 1950-05-29 DOA: 11/21/2019 PCP: Patient, No Pcp Per     Brief Narrative:  Evan [name] is a 69 y.o. 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  Patient presented to the ED on 10/20 with significant confusion, weakness for 24 hours.  He was found to have CVA with carotid stenosis. Carotid stenting was recommended but was delayed because of his poor respiratory status and poor nutritional status.  Core track feeding was started to augment nutrition while pending carotid stenting. Respiratory status subsequently improved and he underwent right carotid stenting on 11/4.  Postprocedure, his hospital course was complicated with low blood pressure, drop in hemoglobin, altered mental status, poor cough reflex, persistent gurgling of secretions and aspiration pneumonia. He has overall improved now and is currently pending placement.    Subjective: 11/13 A/O x4, continued increased WOB.  Anxiety controlled per patient.  Pain control per patient.   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   Hyperlipidemia   TOBACCO ABUSE   Essential hypertension   COPD (chronic obstructive pulmonary disease) ([facility])   Traumatic amputation of right foot ([facility])   End of life care   Bilateral carotid artery stenosis   Acute respiratory distress   Palliative care by specialist   Stenosis of right carotid artery   Weakness generalized   Stenosis of artery (H[facility])   Severe sepsis ([place])   HCAP (healthcare-associated pneumonia)   Dyspnea   Cerebrovascular accident (CVA) ([place])   Advanced care planning/counseling  discussion   Goals of care, counseling/discussion   Acute nonhemorrhagic CVA, left temporal occipital region/Bilateral carotid artery stenosis -Presented with confusion and weakness.  -Imaging showed acute ischemic infarct involving the left temporoccipital region, -LDL 154, A1c 6.3. Echocardiogram EF 55 to 60%. CTA head and neck did not show LVO but showed severe bilateral carotid artery stenosis therefore IR consulted for stent placement. -11/4, underwent right carotid artery stenting by neuro IR.  -Because of coexisting DVT, patient was started on heparin drip and Brilinta. However heparin drip had to be held on 11/6 because of hematuria. -11/10 anticoagulation not restarted.  See goals of care -11/11 patient and children have decided on comfort care  Acute on chronic respiratory failure with hypoxia/COPD/stage III lung cancer -Per patient on 2 L O2 at home prior to admission -11/10 currently on 2 l O2 which is his home regimen. -Patient and son understand patient is to schedule follow-up with PCCM when it gets close to time for release from SNF -Patient and son understand patient is to schedule follow-up with Dr. Julien Nordmann oncology when he gets close time for release from SNF -11/10 ADDENDUM needs to bedside prior to discharge patient in acute respiratory distress with hypoxia.  Tachypneic, tachycardic 11/11 patient and children have decided on comfort care  Severe sepsis/ HCAP -11/10 patient meets criteria for severe sepsis WBC> 12, RR> 20, HR> 90, area of infection lungs.  Lactic acid> 2.2 -11/10 Albumin 25 g -11/10 DC Augmentin, start Cefepime -DuoNeb QID -Solu-Medrol 60 mg BID -11/10 DC scopolamine patch making it extremely difficult for patient to clear sputum -Morphine PRN -CTA chest PE protocol pending 11/11  patient and children have decided on comfort care  Chronic gurgling throat sound -During this hospitalization, patient had bilateral diffuse coarse breath sounds  from aspiration pneumonia and upper airway secretion.  -He states that for several months, patient is having gurgling sound in his throat. He reports that Dr. Julien Nordmann, his oncologist suspects it is secondary to scar tissue from cancer. Patient does not feel any pain or distress related to that. -Patient has not required more than 2 to 3 L of oxygen throughout this stay. -Titrate O2 to maintain SPO2> 88% -Complete course of Augmentin last dose 11/15 -Continue a tapering course of steroids. -Also continue bronchodilators, scopolamine. -11/10 patient allowed some NTS to be performed, thick yellow mucus obtained.  Patient would not allow a thorough NTS to be performed. -11/10 per patient and patient's son throat gurgling is NOT CHRONIC, but started when patient diagnosed with pneumonia. -See goals of care  Acute delirium/Acute metabolic encephalopathy -Multifactorial: Prolonged hospitalization, stroke, hypoxia. -11/10 resolved patient A/O x4 -Seroquel 25 mg twice daily.   11/11 patient and children have decided on comfort care  Hypotension -Maintain MAP> 65 -See severe sepsis  Acute DVT of left upper extremity/Chronicleft IJ thrombosis -CT angio of neck from 10/20 showed chronic findings suspicious for left IJ thrombosis but at the time, patient did not have any swelling of the left upper extremity.  -11/5, patient was noted to have worsening left upper extremity swelling and hence ultrasound duplex was obtained which showed an acute DVT involving the left internal jugular vein, left subclavian vein, left jugular vein, left brachial veins and left basilic vein. -Patient has been started on heparin drip but discontinued secondary to acute blood loss anemia and hematuria. -11/10 D-dimer> 20, given patient's acute respiratory desaturation concerning for PE -Empirically restart heparin drip maintain at low end of therapeutic level.  Await CTA PE protocol findings 11/11 patient and children  have decided on comfort care  Acute blood loss anemia/Hematuria -On11/5, hemoglobin dropped down to the lowest of 6.6.  -11/5 transfuse 2 units PRBC  -CT abdomen pelvis did not show any evidence of retroperitoneal bleed. 11/6, patienthad to be started on heparin drip for left upper external DVT but he started having hematuria again.  -Heparin drip was stopped. Brilinta +Aaspirin 81 mg daily for stroke prevention continued. 11/11 patient and children have decided on comfort care  Severe protein calorie malnutrition -10/29 - 11/5 -patient was fed through core track because of poor nutritional status prior to surgery. -Currently on oral feeding. 11/11 patient and children have decided on comfort care  Chronic urinary retention -Patient admitted with indwelling Foley catheter> 61 days old.  So was removed -11/10 increaseTamsulosin 0.4 mg BID -11/10 reinsert Foley catheter 11/11 patient and children have decided on comfort care   Goals of care -Spoke at length with patient, son, and daughter on the phone.  Explained that patient was at significant risk for future PE, DVT given that he had cancer and a current DVT.  Stated that we could keep patient overnight and restart anticoagulation and determine if he has acute bleed again, if he does not would discharge on anticoagulant.  Patient stated he understood that he could develop a clot which could be terminal but that he wanted to be discharged to SNF today.  Patient's children stated they would honor their father's wishes. ADDENDUM 11/10 again explained to patient that if he was going to improve he was going to have to allow [place] to perform therapies to improve his  respiratory status, some of which were not going to be comfortable.  Patient will not allow a number of therapies to be performed.  Son at bedside for discussion of this plan of care.  Patient and son agreed palliative care needed to be involved -11/10 palliative care consulted;  believe patient is having a hard time deciding on what his goals of care are, and understanding that he is in the terminal phase of his disease process. -11/12 spoke with son and daughter who are both at bedside family and patient understand plan is to get him to [facility] ASAP   DVT prophylaxis: SCD Code Status: Full Family Communication: See goals of care Status is: Inpatient    Dispo: The patient is from: Home              Anticipated d/c is to: SNF vs hospice              Anticipated d/c date is:??              Patient currently extremely unstable      Consultants:  Neurology   Procedures/Significant Events:  11/10 PCXR;Chronic accentuation of perihilar markings with RIGHT upper lobe volume loss/scarring likely related to prior cancer treatment. -Increased elevation of LEFT diaphragm.   I have personally reviewed and interpreted all radiology studies and my findings are as above.  VENTILATOR SETTINGS: Nasal cannula 11/13 Flow 5 L/min SPO2 91%   Cultures 11/10 sputum pending   Antimicrobials: Anti-infectives (From admission, onward)   Start     Ordered Stop   12/12/19 2200  ceFEPIme (MAXIPIME) 2 g in sodium chloride 0.9 % 100 mL IVPB  Status:  Discontinued        12/12/19 1953 12/13/19 1037   12/12/19 0000  amoxicillin-clavulanate (AUGMENTIN) 875-125 MG tablet        12/12/19 1128     12/10/19 1415  amoxicillin-clavulanate (AUGMENTIN) 875-125 MG per tablet 1 tablet  Status:  Discontinued        12/10/19 1319 12/12/19 1953   12/07/19 1500  Ampicillin-Sulbactam (UNASYN) 3 g in sodium chloride 0.9 % 100 mL IVPB  Status:  Discontinued        12/07/19 1404 12/10/19 1319   11/28/19 0000  ceFAZolin (ANCEF) IVPB 2g/100 mL premix  Status:  Discontinued        11/27/19 1730 11/28/19 0857   11/28/19 0000  ceFAZolin (ANCEF) IVPB 2g/100 mL premix  Status:  Discontinued        11/27/19 1730 11/27/19 1733   11/25/19 1530  Ampicillin-Sulbactam (UNASYN) 3 g in sodium  chloride 0.9 % 100 mL IVPB        11/25/19 1432 12/02/19 2359       Devices    LINES / TUBES:      Continuous Infusions:    Objective: Vitals:   12/13/19 2022 12/14/19 0750 12/14/19 2119 12/15/19 0926  BP: 101/68 115/82 90/74 95/81   Pulse: (!) 112 (!) 108  98  Resp: 18 (!) 24 12 16   Temp: (!) 97 F (36.1 C) (!) 97.4 F (36.3 C) (!) 97 F (36.1 C) 97.7 F (36.5 C)  TempSrc: Axillary Axillary Axillary Oral  SpO2: (!) 84% 96% 100% 97%  Weight:      Height:        Intake/Output Summary (Last 24 hours) at 12/15/2019 1327 Last data filed at 12/15/2019 1100 Gross per 24 hour  Intake 120 ml  Output 1600 ml  Net -1480 ml   Filed  Weights   12/11/19 0500 12/12/19 0500 12/12/19 2000  Weight: 44.6 kg 43.8 kg 42.6 kg    Examination:  11/11 patient and children have decided on comfort care.     Data Reviewed: Care during the described time interval was provided by me .  I have reviewed this patient's available data, including medical history, events of note, physical examination, and all test results as part of my evaluation.  CBC: Recent Labs  Lab 12/09/19 0322 12/09/19 0322 12/10/19 0649 12/10/19 0649 12/11/19 0348 12/12/19 0031 12/12/19 1522 12/13/19 0111 12/13/19 0500  WBC 13.5*   < > 13.1*  --  12.9* 15.7* 21.7*  --  21.6*  NEUTROABS 11.9*  --  11.4*  --  11.4*  --  19.9*  --   --   HGB 10.7*   < > 10.9*   < > 10.7* 11.6* 12.3* 12.2* 10.9*  HCT 32.6*   < > 34.1*   < > 33.6* 37.1* 39.8 36.0* 35.0*  MCV 85.6   < > 86.3  --  88.4 87.5 88.1  --  88.2  PLT 491*   < > 467*  --  435* 427* 441*  --  348   < > = values in this interval not displayed.   Basic Metabolic Panel: Recent Labs  Lab 12/09/19 0322 12/09/19 0322 12/10/19 0649 12/11/19 0348 12/12/19 1522 12/13/19 0111 12/13/19 0500  NA 136   < > 140 140 136 137 138  K 4.0   < > 3.8 4.1 4.1 4.7 4.9  CL 102  --  104 104 98  --  102  CO2 27  --  27 29 27   --  27  GLUCOSE 109*  --  114* 147*  142*  --  139*  BUN 10  --  9 11 12   --  14  CREATININE 0.55*  --  0.64 0.59* 0.66  --  0.61  CALCIUM 8.3*  --  8.2* 8.4* 8.7*  --  8.6*  MG  --   --   --   --  2.0  --  2.0  PHOS  --   --   --   --   --   --  4.4   < > = values in this interval not displayed.   GFR: Estimated Creatinine Clearance: 53.3 mL/min (by C-G formula based on SCr of 0.61 mg/dL). Liver Function Tests: Recent Labs  Lab 12/12/19 1522 12/13/19 0500  AST 25 18  ALT 43 32  ALKPHOS 102 86  BILITOT 0.6 0.5  PROT 5.7* 5.5*  ALBUMIN 2.5* 2.4*   No results for input(s): LIPASE, AMYLASE in the last 168 hours. No results for input(s): AMMONIA in the last 168 hours. Coagulation Profile: No results for input(s): INR, PROTIME in the last 168 hours. Cardiac Enzymes: No results for input(s): CKTOTAL, CKMB, CKMBINDEX, TROPONINI in the last 168 hours. BNP (last 3 results) No results for input(s): PROBNP in the last 8760 hours. HbA1C: No results for input(s): HGBA1C in the last 72 hours. CBG: Recent Labs  Lab 12/12/19 0809 12/12/19 1148 12/12/19 1636 12/12/19 2013 12/13/19 0818  GLUCAP 115* 119* 117* 122* 124*   Lipid Profile: No results for input(s): CHOL, HDL, LDLCALC, TRIG, CHOLHDL, LDLDIRECT in the last 72 hours. Thyroid Function Tests: No results for input(s): TSH, T4TOTAL, FREET4, T3FREE, THYROIDAB in the last 72 hours. Anemia Panel: No results for input(s): VITAMINB12, FOLATE, FERRITIN, TIBC, IRON, RETICCTPCT in the last 72 hours. Sepsis Labs: Recent  Labs  Lab 12/12/19 1522 12/12/19 2113 12/13/19 0500  PROCALCITON <0.10  --   --   LATICACIDVEN 2.2* 2.4* 1.3    Recent Results (from the past 240 hour(s))  Respiratory Panel by RT PCR (Flu A&B, Covid) - Nasopharyngeal Swab     Status: None   Collection Time: 12/10/19  4:19 PM   Specimen: Nasopharyngeal Swab  Result Value Ref Range Status   SARS Coronavirus 2 by RT PCR NEGATIVE NEGATIVE Final    Comment: (NOTE) SARS-CoV-2 target nucleic acids  are NOT DETECTED.  The SARS-CoV-2 RNA is generally detectable in upper respiratoy specimens during the acute phase of infection. The lowest concentration of SARS-CoV-2 viral copies this assay can detect is 131 copies/mL. A negative result does not preclude SARS-Cov-2 infection and should not be used as the sole basis for treatment or other patient management decisions. A negative result may occur with  improper specimen collection/handling, submission of specimen other than nasopharyngeal swab, presence of viral mutation(s) within the areas targeted by this assay, and inadequate number of viral copies (<131 copies/mL). A negative result must be combined with clinical observations, patient history, and epidemiological information. The expected result is Negative.  Fact Sheet for Patients:  PinkCheek.be  Fact Sheet for Healthcare Providers:  GravelBags.it  This test is no t yet approved or cleared by the [place] FDA and  has been authorized for detection and/or diagnosis of SARS-CoV-2 by FDA under an Emergency Use Authorization (EUA). This EUA will remain  in effect (meaning this test can be used) for the duration of the COVID-19 declaration under Section 564(b)(1) of the Act, 21 U.S.C. section 360bbb-3(b)(1), unless the authorization is terminated or revoked sooner.     Influenza A by PCR NEGATIVE NEGATIVE Final   Influenza B by PCR NEGATIVE NEGATIVE Final    Comment: (NOTE) The Xpert Xpress SARS-CoV-2/FLU/RSV assay is intended as an aid in  the diagnosis of influenza from Nasopharyngeal swab specimens and  should not be used as a sole basis for treatment. Nasal washings and  aspirates are unacceptable for Xpert Xpress SARS-CoV-2/FLU/RSV  testing.  Fact Sheet for Patients: PinkCheek.be  Fact Sheet for Healthcare Providers: GravelBags.it  This test is not  yet approved or cleared by the [place] FDA and  has been authorized for detection and/or diagnosis of SARS-CoV-2 by  FDA under an Emergency Use Authorization (EUA). This EUA will remain  in effect (meaning this test can be used) for the duration of the  Covid-19 declaration under Section 564(b)(1) of the Act, 21  U.S.C. section 360bbb-3(b)(1), unless the authorization is  terminated or revoked. Performed at [facility], [place] [address] [place], [place] [address]          Radiology Studies: No results found.      Scheduled Meds: .  stroke: mapping our early stages of recovery book   Does not apply Once  . chlorhexidine  15 mL Mouth/Throat Once  . Chlorhexidine Gluconate Cloth  6 each Topical Daily  . glycopyrrolate  0.4 mg Intravenous QID  . nicotine  14 mg Transdermal Daily   Continuous Infusions:    LOS: 24 days    Time spent: 10 min    Aydrien Froman, Geraldo Docker, MD [facility] Pager [phone]  If 7PM-7AM, please contact night-coverage [email or web address] Password [email or web address] 12/15/2019, 1:27 PM

## 2019-12-15 NOTE — Progress Notes (Signed)
Daily Progress Note   Patient Name: Evan [name]       Date: 12/15/2019 DOB: December 19, 1950  Age: 69 y.o. MRN#: [id number] Attending Physician: Allie Bossier, MD Primary Care Physician: Patient, No Pcp Per Admit Date: 11/21/2019  Reason for Consultation/Follow-up: Establishing goals of care  Subjective: Patient awake, continues to look short of breath, but denies feeling short of breath. Continue to await BP. Per RN symptoms are well managed with current interventions.    Length of Stay: 24  Current Medications: Scheduled Meds:  .  stroke: mapping our early stages of recovery book   Does not apply Once  . chlorhexidine  15 mL Mouth/Throat Once  . Chlorhexidine Gluconate Cloth  6 each Topical Daily  . glycopyrrolate  0.4 mg Intravenous QID  . nicotine  14 mg Transdermal Daily    Continuous Infusions:   PRN Meds: acetaminophen **OR** acetaminophen (TYLENOL) oral liquid 160 mg/5 mL **OR** acetaminophen, diazepam, hydrOXYzine, morphine injection  Physical Exam Vitals and nursing note reviewed.  Constitutional:      Comments: Frail, weak  Pulmonary:     Comments: Increased WOB, secretions improved            Vital Signs: BP 95/81 (BP Location: Right Arm)   Pulse 98   Temp 97.7 F (36.5 C) (Oral)   Resp 16   Ht 5\' 5"  (1.651 m)   Wt 42.6 kg   SpO2 97%   BMI 15.63 kg/m  SpO2: SpO2: 97 % O2 Device: O2 Device: Nasal Cannula O2 Flow Rate: O2 Flow Rate (L/min): (S) 5 L/min (decreased from 10L )  Intake/output summary:   Intake/Output Summary (Last 24 hours) at 12/15/2019 1138 Last data filed at 12/15/2019 1100 Gross per 24 hour  Intake 120 ml  Output 1600 ml  Net -1480 ml   LBM: Last BM Date: 12/13/19 Baseline Weight: Weight: 48.1 kg Most recent weight: Weight:  42.6 kg       Palliative Assessment/Data: PPS: 20%   Flowsheet Rows     Most Recent Value  Intake Tab  Referral Department Hospitalist  Unit at Time of Referral Intermediate Care Unit  Date Notified 11/26/19  Palliative Care Type New Palliative care  Reason for referral Clarify Goals of Care  Date of Admission 11/21/19  Date first seen by Palliative Care 11/28/19  #  of days Palliative referral response time 2 Day(s)  # of days IP prior to Palliative referral 5  Clinical Assessment  Psychosocial & Spiritual Assessment  Palliative Care Outcomes      Patient Active Problem List   Diagnosis Date Noted  . Cerebrovascular accident (CVA) ([place])   . Advanced care planning/counseling discussion   . Goals of care, counseling/discussion   . Dyspnea   . Severe sepsis ([place]) 12/12/2019  . HCAP (healthcare-associated pneumonia) 12/12/2019  . Stenosis of right carotid artery 12/06/2019  . Weakness generalized   . Stenosis of artery ([place])   . Acute respiratory distress   . Palliative care by specialist   . Acute cerebrovascular accident (CVA) ([place]) 11/21/2019  . Hyperkalemia 11/21/2019  . Protein-calorie malnutrition, severe 11/17/2019  . Acute exacerbation of chronic obstructive pulmonary disease (COPD) ([place]) 11/15/2019  . Constipation 10/29/2019  . Asymptomatic bacteriuria 10/29/2019  . Pneumonia 10/28/2019  . Primary cancer of left lower lobe of lung ([place]) 04/06/2019  . Involuntary movements 08/24/2018  . Primary cancer of right middle lobe of lung ([place]) 07/16/2018  . Nodule of middle lobe of right lung 06/20/2018  . Bilateral carotid artery stenosis 02/18/2017  . Left renal mass 07/19/2016  . Phlebitis 02/04/2015  . Full code status 11/04/2014  . End of life care 11/04/2014  . 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 TOBACCO ABUSE 04/02/2008  . 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 Hyperlipidemia 02/09/2008  . DEPRESSION 02/09/2008  . Essential hypertension 02/09/2008  . COPD (chronic obstructive pulmonary disease) ([place]) 02/09/2008  . HEADACHE 02/09/2008    Palliative Care Assessment & Plan   Patient Profile: 69 y/o M with Stage IIIa NSCLCA, COPD with recent admissions for pneumonia who has been admitted on 11/21/19 for confusion who has been found to have left temporoccipital infarct in setting of carotid artery stenosis.  Status post carotid stent placements. Overall failure to thrive. He was transitioned to full comfort measures only.   Assessment/Recommendations/Plan   Await residential hospice bed  Continue current comfort orders  Goals of Care and Additional Recommendations:  Limitations on Scope of Treatment: Full Comfort Care  Code Status:  DNR  Prognosis:   < 2 weeks  Discharge Planning:  Hospice facility  Care plan was discussed with patient's daughters.  Thank you for allowing the Palliative Medicine Team to assist in the care of this patient.   Total time: 26 mins Greater than 50%  of this time was spent counseling and coordinating care related to the above assessment and plan.  Mariana Kaufman, AGNP-C Palliative Medicine   Please contact Palliative Medicine Team phone at [phone] for questions and concerns.

## 2019-12-16 DIAGNOSIS — R1312 Dysphagia, oropharyngeal phase: Secondary | ICD-10-CM

## 2019-12-16 DIAGNOSIS — R0989 Other specified symptoms and signs involving the circulatory and respiratory systems: Secondary | ICD-10-CM

## 2019-12-16 MED ORDER — ASPIRIN 81 MG PO TBEC: 81.0000 mg | DELAYED_RELEASE_TABLET | Freq: Every day | ORAL | 0 refills | Status: AC

## 2019-12-16 NOTE — TOC Transition Note (Signed)
Transition of Care [facility]) - CM/SW Discharge Note   Patient Details  Name: Evan [name] MRN: [id number] Date of Birth: [birth year]  Transition of [facility] CM/SW Contact:  Bary Castilla, LCSW Phone Number: [phone] 12/16/2019, 1:23 PM   Clinical Narrative:     Patient will DC to:?[facility] Anticipated DC date:?12/16/19 Family notified:?[facility] DJ:MEQA   Per MD patient ready for DC to [facility]. RN, patient, patient's family, and facility notified of DC. Discharge Summary sent to facility. RN given number for report [phone]. DC packet on chart. Ambulance transport requested for patient.   CSW signing off.   [place], [place] [phone]   Final next level of care: [place] Barriers to Discharge: Barriers Resolved   Patient Goals and CMS Choice Patient states their goals for this hospitalization and ongoing recovery are:: unsure at this time   Choice offered to / list presented to : Adult Children  Discharge Placement PASRR number recieved: 11/26/19            Patient chooses bed at:  [facility]) Patient to be transferred to facility by: [place] Name of family member notified: [place] Patient and family notified of of transfer: 12/16/19  Discharge Plan and Services                  DME Agency: [facility] (now [facility])                  Social Determinants of Health (SDOH) Interventions     Readmission Risk Interventions No flowsheet data found.

## 2019-12-16 NOTE — Progress Notes (Addendum)
Manufacturing engineer Evan [name]) Evan [name] has a bed for Evan [name] today.   Discussed with daughter Evan [name] and they want to proceed with transferring him to [facility] [name].   Evan [name] was concerned if he could d/c should he improve once at [name].  Advised him he could at any time but the emphasis is not on restorative measures in any capacity. Family voices understanding.  Once consents for transfer are complete, will update TOC manager to arrange transport. Updated attending and TOC that we have a bed and just need to make sure that transferring there is in alignment with Evan [name] wishes.   Report can be called to [phone] at any time, bed is assigned at this time.  Evan Carbon RN, BSN, [name] Liaison   **consents are completed, [name] manager updated, transport can be arranged at this time.  Please fax dc summary to [phone]

## 2019-12-16 NOTE — Discharge Summary (Signed)
Physician Discharge Summary  LARNIE HEART WUJ:[id number] DOB: [birth year] DOA: 11/21/2019  PCP: Patient, No Pcp Per  Admit date: 11/21/2019 Discharge date: 12/16/2019  Time spent: 35 minutes  Recommendations for Outpatient Follow-up:   Acute nonhemorrhagic CVA, left temporal occipital region/Bilateral carotid artery stenosis -Presented with confusion and weakness.  -Imaging showed acute ischemic infarct involving the left temporoccipital region, -LDL 154, A1c 6.3. Echocardiogram EF 55 to 60%. CTA head and neck did not show LVO but showed severe bilateral carotid artery stenosis therefore IR consulted for stent placement. -11/4, underwent right carotid artery stenting by neuro IR.  -Because of coexisting DVT, patient was started on heparin drip and Brilinta. However heparin drip had to be held on 11/6 because of hematuria. -11/11 patient and children have decided on comfort care  Acuteon chronicrespiratory failure with hypoxia/COPD/stage III lung cancer -Per patient on 2LO2 at home prior to admission -11/10 currently on 2 l O2 which is his home regimen. -Patient and son understand patient is to schedule follow-up with Dr. Julien Nordmann oncology when he gets close time for release from SNF -11/10 ADDENDUM needs to bedside prior to discharge patient in acute respiratory distress with hypoxia.  Tachypneic, tachycardic 11/11 patient and children have decided on comfort care 11/13 Dr. Elsworth Soho PCCM and Dr. Lorna Few oncology both notified that patient had worsening respiratory status and had decided on residential hospice care  Severe sepsis/ HCAP -11/10 patient meets criteria for severe sepsis WBC> 12, RR> 20, HR> 90, area of infection lungs.  Lactic acid> 2.2 -11/10 Albumin 25 g -11/10 DC Augmentin, start Cefepime -DuoNeb QID -Solu-Medrol 60 mg BID -11/10 DC scopolamine patch making it extremely difficult for patient to clear sputum -Morphine PRN -CTA chest PE protocol; patient  refused 11/11 patient and children have decided on comfort care  Chronic gurgling throat sound -During this hospitalization, patient had bilateral diffuse coarse breath sounds from aspiration pneumonia and upper airway secretion.  -He states that for several months, patient is having gurgling sound in his throat. He reports that Dr. Tiburcio Pea oncologist suspects it is secondary to scar tissue from cancer. Patient does not feel any pain or distress related to that. -Patient has not required more than 2 to 3 L of oxygen throughout this stay. -Titrate O2 to maintain SPO2>88% -Complete course of Augmentin last dose 11/15 -Continue a tapering course of steroids. -Also continue bronchodilators, -11/10 patient allowed some NTS to be performed, thick yellow mucus obtained.  Patient would not allow a thorough NTS to be performed. -11/10 per patient and patient's son throat gurgling is NOT CHRONIC, but started when patient diagnosed with pneumonia. -See goals of care  Acute delirium/Acute metabolic encephalopathy -Multifactorial: Prolonged hospitalization, stroke, hypoxia. -11/10 resolved patient A/O x4 -Seroquel 25 mg twice daily.   11/11 patient and children have decided on comfort care  Hypotension -Maintain MAP> 65 -See severe sepsis  Acute DVT of left upper extremity/Chronicleft IJ thrombosis -CT angio of neck from 10/20 showed chronic findings suspicious for left IJ thrombosis but at the time, patient did not have any swelling of the left upper extremity.  -11/5, patient was noted to have worsening left upper extremity swelling and hence ultrasound duplex was obtained which showed anacute DVTinvolving the left internal jugular vein, left subclavian vein, left jugular vein, left brachial veins and left basilic vein. -Patient has been started on heparin drip but discontinued secondary to acute blood loss anemia and hematuria. -11/10 D-dimer> 20, given patient's acute respiratory  desaturation concerning for PE -Empirically  restart heparin drip maintain at low end of therapeutic level.  Await CTA PE protocol findings.  Never obtain patient refused 11/11 patient and children have decided on comfort care  Acute blood lossanemia/Hematuria -On11/5, hemoglobin dropped down to the lowest of 6.6. -11/5transfuse 2 units PRBC -CT abdomen pelvisdid not show any evidence of retroperitoneal bleed. 11/6, patienthad to be started on heparin drip for left upper external DVT but he started having hematuria again.  -Heparin drip was stopped. Brilinta +Aaspirin 81 mg daily for stroke prevention continued. 11/11 patient and children have decided on comfort care  Severe protein calorie malnutrition -10/29 - 11/5 -patient was fed through core track because of poor nutritional status prior to surgery. -Currently on oral feeding. 11/11 patient and children have decided on comfort care  Chronic urinary retention -Patient admitted with indwelling Foley catheter> 68 days old.  So was removed -11/10 increaseTamsulosin 0.4 mg BID -11/10 reinsert Foley catheter 11/11 patient and children have decided on comfort care   Goals of care -11/10 palliative care consulted; believe patient is having a hard time deciding on what his goals of care are, and understanding that he is in the terminal phase of his disease process. 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[place])   End of life care   Bilateral carotid artery stenosis   Acute respiratory distress   Palliative care by specialist    Stenosis of right carotid artery   Weakness generalized   Stenosis of artery ([facility])   Severe sepsis ([place])   HCAP (healthcare-associated pneumonia)   Dyspnea   Cerebrovascular accident (CVA) ([place])   Advanced care planning/counseling discussion   Goals of care, counseling/discussion   Discharge Condition: Guarded  Diet recommendation: Comfort feeds  Filed Weights   12/11/19 0500 12/12/19 0500 12/12/19 2000  Weight: 44.6 kg 43.8 kg 42.6 kg    History of present illness:  Antonios L Sloanis a 69 y.o.maleBM PMHx lung cancer apparently stage IIIa s/p Chemo now in remission, recently hospitalized a week prior to ICU with recurrent pneumonia, COPD, depression, left renal mass, anxiety disorder, 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  Patient presented to the ED on 10/20 with significant confusion, weakness for 24 hours.  He was found to have CVA with carotid stenosis. Carotid stenting was recommended but was delayed because of his poor respiratory status and poor nutritional status. Core track feeding was started to augment nutrition while pending carotid stenting. Respiratory status subsequently improved and he underwent right carotid stenting on 11/4.  Postprocedure, his hospital course was complicated with low blood pressure, drop in hemoglobin, altered mental status, poor cough reflex, persistent gurgling of secretions and aspiration pneumonia.He has overall improved now and is currently pending placement.   Hospital Course:  See above  Procedures: 11/10 PCXR;Chronic accentuation of perihilar markings with RIGHT upper lobe volume loss/scarring likely related to prior cancer treatment. -Increased elevation of LEFT diaphragm.   Consultations: Neurology  Ventilator settings; Nasal cannula 11/14 Flow; 6 L/min SPO2 100%   Antibiotics Anti-infectives (From admission, onward)   Start     Ordered Stop  12/12/19 2200  ceFEPIme (MAXIPIME) 2 g in sodium chloride  0.9 % 100 mL IVPB  Status:  Discontinued        12/12/19 1953 12/13/19 1037   12/12/19 0000  amoxicillin-clavulanate (AUGMENTIN) 875-125 MG tablet        12/12/19 1128     12/10/19 1415  amoxicillin-clavulanate (AUGMENTIN) 875-125 MG per tablet 1 tablet  Status:  Discontinued        12/10/19 1319 12/12/19 1953   12/07/19 1500  Ampicillin-Sulbactam (UNASYN) 3 g in sodium chloride 0.9 % 100 mL IVPB  Status:  Discontinued        12/07/19 1404 12/10/19 1319   11/28/19 0000  ceFAZolin (ANCEF) IVPB 2g/100 mL premix  Status:  Discontinued        11/27/19 1730 11/28/19 0857   11/28/19 0000  ceFAZolin (ANCEF) IVPB 2g/100 mL premix  Status:  Discontinued        11/27/19 1730 11/27/19 1733   11/25/19 1530  Ampicillin-Sulbactam (UNASYN) 3 g in sodium chloride 0.9 % 100 mL IVPB        11/25/19 1432 12/02/19 2359       Discharge Exam: Vitals:   12/14/19 2119 12/15/19 0926 12/15/19 2250 12/16/19 0358  BP: 90/74 95/81 106/79 101/82  Pulse:  98 (!) 115 89  Resp: 12 16 (!) 21 13  Temp: (!) 97 F (36.1 C) 97.7 F (36.5 C) 98 F (36.7 C) 97.6 F (36.4 C)  TempSrc: Axillary Oral Axillary Axillary  SpO2: 100% 97% 100% 100%  Weight:      Height:        11/11 patient and children have decided on comfort care.   Discharge Instructions  Discharge Instructions    Amb Referral to Nutrition and Diabetic E   Complete by: As directed      Allergies as of 12/16/2019      Reactions   Chantix [varenicline] Other (See Comments)   Weird dreams      Medication List    STOP taking these medications   Bevespi Aerosphere 9-4.8 MCG/ACT Aero Generic drug: Glycopyrrolate-Formoterol   nicotine polacrilex 4 MG gum Commonly known as: NICORETTE   predniSONE 20 MG tablet Commonly known as: DELTASONE     TAKE these medications   acetaminophen 500 MG tablet Commonly known as: TYLENOL Take 500 mg by mouth every 6 (six) hours as needed for mild pain, fever or headache.   albuterol 108 (90 Base)  MCG/ACT inhaler Commonly known as: VENTOLIN HFA TAKE 2 PUFFS BY MOUTH EVERY 6 HOURS AS NEEDED FOR WHEEZE OR SHORTNESS OF BREATH What changed: See the new instructions.   amoxicillin-clavulanate 875-125 MG tablet Commonly known as: AUGMENTIN Take 1 tablet by mouth every 12 (twelve) hours.   arformoterol 15 MCG/2ML Nebu Commonly known as: BROVANA Take 2 mLs (15 mcg total) by nebulization 2 (two) times daily.   aspirin 81 MG EC tablet Take 1 tablet (81 mg total) by mouth daily. Swallow whole. What changed:   medication strength  how much to take  additional instructions   atorvastatin 80 MG tablet Commonly known as: LIPITOR Take 1 tablet (80 mg total) by mouth daily.   benzonatate 100 MG capsule Commonly known as: TESSALON Take 1 capsule (100 mg total) by mouth every 8 (eight) hours.   budesonide 0.5 MG/2ML nebulizer solution Commonly known as: PULMICORT TAKE 2 MLS (0.5 MG TOTAL) BY NEBULIZATION 2 (TWO) TIMES DAILY.   feeding supplement (OSMOLITE 1.2 CAL) Liqd Place 1,000 mLs into feeding  tube continuous.   feeding supplement (PROSource TF) liquid Place 45 mLs into feeding tube daily.   guaiFENesin 600 MG 12 hr tablet Commonly known as: MUCINEX Take 600 mg by mouth 2 (two) times daily as needed for cough. What changed: Another medication with the same name was removed. Continue taking this medication, and follow the directions you see here.   hydrOXYzine 25 MG tablet Commonly known as: ATARAX/VISTARIL Take 1 tablet (25 mg total) by mouth 3 (three) times daily as needed for anxiety.   ipratropium-albuterol 0.5-2.5 (3) MG/3ML Soln Commonly known as: DUONEB Take 3 mLs by nebulization every 6 (six) hours as needed. What changed: reasons to take this   nicotine 14 mg/24hr patch Commonly known as: NICODERM CQ - dosed in mg/24 hours Place 1 patch (14 mg total) onto the skin daily. What changed: additional instructions   pantoprazole 40 MG tablet Commonly known as:  PROTONIX Take 1 tablet (40 mg total) by mouth 2 (two) times daily.   prednisoLONE 5 MG Tabs tablet 2 tablets by mouth daily 11/11--- 11/15.  Then 11/16--- 11/21 tablet by mouth daily.  Then stop   QUEtiapine 25 MG tablet Commonly known as: SEROQUEL Take 1 tablet (25 mg total) by mouth 2 (two) times daily.   revefenacin 175 MCG/3ML nebulizer solution Commonly known as: YUPELRI Take 3 mLs (175 mcg total) by nebulization daily.   scopolamine 1 MG/3DAYS Commonly known as: TRANSDERM-SCOP Place 1 patch (1.5 mg total) onto the skin every 3 (three) days.   senna-docusate 8.6-50 MG tablet Commonly known as: Senokot-S Take 1 tablet by mouth at bedtime as needed for moderate constipation.   tamsulosin 0.4 MG Caps capsule Commonly known as: FLOMAX Take 1 capsule (0.4 mg total) by mouth 2 (two) times daily. What changed: when to take this   ticagrelor 60 MG Tabs tablet Commonly known as: BRILINTA Take 0.5 tablets (30 mg total) by mouth 2 (two) times daily.      Allergies  Allergen Reactions  . Chantix [Varenicline] Other (See Comments)    Weird dreams    Contact information for follow-up providers    Luanne Bras, MD Follow up in 4 week(s).   Specialties: Interventional Radiology, Radiology Why: Please follow-up with Dr. Estanislado Pandy in clinic 4 weeks after discharge. Our office will call you to set up this appointment. Contact information: [address] [place] [address] [phone]            Contact information for after-discharge care    Destination    [facility] .   Service: Skilled Nursing Contact information: [place] [phone]                   The results of significant diagnostics from this hospitalization (including imaging, microbiology, ancillary and laboratory) are listed below for reference.    Significant Diagnostic Studies: CT ABDOMEN PELVIS WO CONTRAST  Result Date:  12/07/2019 CLINICAL DATA:  69 year old male status post bilateral femoral approach carotid stenting 12/06/2019 with decreasing hemoglobin overnight. Assess for retroperitoneal hemorrhage. EXAM: CT ABDOMEN AND PELVIS WITHOUT CONTRAST TECHNIQUE: Multidetector CT imaging of the abdomen and pelvis was performed following the standard protocol without IV contrast. COMPARISON:  CT abdomen/pelvis 10/28/2019; PET-CT 03/30/2019 FINDINGS: Lower chest: Chronic elevation of the left hemidiaphragm patchy ground-glass attenuation airspace opacities in the dependent portions of both the right and left lower lobe distributed in a peribronchovascular distribution. Diffuse mild bronchial wall thickening. Trace left pleural effusion. The  visualized cardiac structures are within normal limits for size. Hepatobiliary: No focal liver abnormality is seen. No gallstones, gallbladder wall thickening, or biliary dilatation. Pancreas: Unremarkable. No pancreatic ductal dilatation or surrounding inflammatory changes. Spleen: Normal in size without focal abnormality. Adrenals/Urinary Tract: Limited evaluation in the absence of intravenous contrast. No hydronephrosis or nephrolithiasis. Normal adrenal glands. A Foley catheter is present within the bladder. Stomach/Bowel: Stomach is within normal limits. Appendix appears normal. No evidence of bowel wall thickening, distention, or inflammatory changes. Gastric tube present with the tip in the stomach. Vascular/Lymphatic: Limited evaluation in the absence of intravenous contrast. Atherosclerotic calcifications present along the abdominal aorta and branch vessels. Progressive left para-aortic lymphadenopathy. Index lymph node measures up to 1.1 cm in short axis (image 43 series 3) compared to 0.7 cm previously. Reproductive: Massive prostatomegaly. The prostate gland measures 6.5 x 6.6 x 6.7 cm (volume = 150 cm^3). Other: No evidence of groin or retroperitoneal hematoma. Musculoskeletal: No acute  fracture or aggressive appearing lytic or blastic osseous lesion. IMPRESSION: 1. No evidence of groin or retroperitoneal hemorrhage. 2. Ground-glass attenuation airspace opacities and bronchial wall thickening in the dependent portions of both lower lungs. Imaging findings are suggestive of aspiration. 3. Progressive left para-aortic lymphadenopathy compared to prior imaging from 10/28/2019 and 03/30/2019. Given known history of non-small cell lung cancer, these findings are concerning for metastatic lymphadenopathy. Additional considerations include lymphoma. Consider further evaluation with PET-CT. 4. Marked prostatomegaly. 5. Additional ancillary findings as above. Electronically Signed   By: Jacqulynn Cadet M.D.   On: 12/07/2019 13:44   CT Code Stroke CTA Head W/WO contrast  Addendum Date: 11/21/2019   ADDENDUM REPORT: 11/21/2019 22:59 ADDENDUM: Study discussed by telephone with Dr. Vanita Panda in the ED on 11/21/2019 at 2252 hours. Electronically Signed   By: Genevie Ann M.D.   On: 11/21/2019 22:59   Result Date: 11/21/2019 CLINICAL DATA:  69 year old male with confusion, left upper extremity weakness. Plain head CT raising the possibility of small left parietal infarct. History of lung cancer EXAM: CT ANGIOGRAPHY HEAD AND NECK TECHNIQUE: Multidetector CT imaging of the head and neck was performed using the standard protocol during bolus administration of intravenous contrast. Multiplanar CT image reconstructions and MIPs were obtained to evaluate the vascular anatomy. Carotid stenosis measurements (when applicable) are obtained utilizing NASCET criteria, using the distal internal carotid diameter as the denominator. CONTRAST:  84m OMNIPAQUE IOHEXOL 350 MG/ML SOLN COMPARISON:  Plain head CT 1650 hours today. CTA head and neck 02/16/2017. Recent CTA chest 11/16/2019. FINDINGS: CTA NECK Skeleton: Stable visualized osseous structures. Degenerative appearing heterogeneous sclerosis in the cervical spine.  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 Continued patchy opacity in the visible superior segment of the left lower lobe (series 6, image 157) from CTA 5 days ago. Other neck: Limited by extensive paravertebral and other bilateral neck venous contrast reflux. No neck mass or lymphadenopathy is evident. Aortic arch: Stable aortic arch since 2019 with mild atherosclerosis. Three vessel arch configuration. Right carotid system: Patent right CCA origin but high-grade right CCA stenosis in the lower neck at the level of of of the thyroid over a segment of about 14 mm, see series 9, image 158. Radiographic string sign stenosis on series 6, image 132 this is chronic but progressed since 2019. The right CCA remains patent. Negative right carotid bifurcation. Right ICA is patent without significant plaque or stenosis. Left carotid system: Patent left CCA origin but  similar abrupt radiographic string sign stenosis of the left CCA in the lower neck (series 6, image 135 and series 9, image 148 over a segment of about 15 mm. Despite this the left CCA remains patent. This stenosis has not significantly changed since 2019. Negative left carotid bifurcation.  No cervical left ICA stenosis. Vertebral arteries: Limited cervical vertebral artery detail related to abundant paravertebral venous contrast, especially on the right where the vertebral is non dominant and diminutive. The right vertebral does remain patent to the skull base. Proximal left subclavian artery and left vertebral origin are within normal limits. Tortuous left V1. Dominant left vertebral artery is patent to the skull base without visible stenosis. CTA HEAD Posterior circulation: Dominant left vertebral primarily supplies the basilar. Patent left PICA origin. Diminutive right vertebral artery is patent to the vertebrobasilar junction with patent PICA origin,  stable since 2019. Patent basilar artery without stenosis. Patent SCA and PCA origins. Posterior communicating arteries are diminutive or absent. Bilateral PCA branches are within normal limits. Anterior circulation: Both ICA siphons are patent. There is moderate supraclinoid segment atherosclerosis on the left resulting in mild supraclinoid stenosis. Cavernous and supraclinoid plaque and irregularity on the right also with mild stenosis. Patent carotid termini. Patent MCA and ACA origins. Dominant right ACA A1 as before. Anterior communicating artery and bilateral ACA branches are stable and within normal limits. Left MCA M1 segment and bifurcation are patent without stenosis. Left MCA branches are stable and within normal limits. Right MCA M1 segment and bifurcation are patent without stenosis. Right MCA branches are stable and within normal limits. Venous sinuses: Intracranial venous structures are patent. Left IJ venous reflux extends into the head as far as the torcula. Left upper extremity contrast injection with extensive paravertebral and other bilateral neck venous contrast reflux. 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 In particular, there is a suspicious pattern of contrast reflux into the left IJ, with the round central filling defect within the vein (series 6, image 100) which seems to abate at the angle of the mandible. Anatomic variants: Dominant left vertebral artery primarily supplies the basilar. Dominant right ACA A1. Review of the MIP images confirms the above findings IMPRESSION: 1. Negative for large vessel occlusion. 2. 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 Bilateral CCA RADIOGRAPHIC STRING SIGNS, stable on the left but progressed on the right since a 2019 CTA. Recommend Vascular Surgery consultation. 3. Positive also for a pattern of venous reflux into the left internal jugular  vein suspicious for LEFT IJ THROMBOSIS below the mandible. Left neck Venous Doppler Ultrasound should be confirmatory. 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 4. No carotid bifurcation plaque or stenosis. Bilateral ICA siphon plaque with only mild stenosis. Otherwise intracranial anterior and posterior circulation within normal limits. 5. 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 Continued abnormal opacity in the superior segment of the left lower lobe from the Chest CTA 5 days ago. Electronically Signed: By: Genevie Ann M.D. On: 11/21/2019 22:42   DG Abd 1 View  Result Date: 11/30/2019 CLINICAL DATA:  Feeding tube placement EXAM: ABDOMEN - 1 VIEW COMPARISON:  None. FINDINGS: Feeding tube passes through the stomach into the duodenum. Feeding tube tip in the second portion duodenum.  Water-soluble contrast injected into the tube filling the duodenum and stomach. IMPRESSION: Feeding tube tip second portion of duodenum. Electronically Signed   By: Franchot Gallo M.D.   On: 11/30/2019 14:50   CT HEAD WO CONTRAST  Result Date: 11/21/2019 CLINICAL DATA:  Nystagmus.  Left upper arm weakness.  Confusion. EXAM: CT HEAD WITHOUT CONTRAST TECHNIQUE: Contiguous axial images were obtained from the base of the skull through the vertex without intravenous contrast. COMPARISON:  10/28/2019 FINDINGS: Brain: Area of low-density noted in the left parietal region not seen on prior study compatible with acute to subacute infarction. No additional acute intracranial abnormality. No hemorrhage or hydrocephalus. Vascular: No hyperdense vessel or unexpected calcification. Skull: No acute calvarial abnormality. Sinuses/Orbits: No acute findings Other: None IMPRESSION: Small area of low-density in the left parietal lobe  compatible with acute to subacute infarction. Electronically Signed   By: Rolm Baptise M.D.   On: 11/21/2019 18:41   CT Code Stroke CTA Neck W/WO contrast  Addendum Date: 11/21/2019   ADDENDUM REPORT: 11/21/2019 22:59 ADDENDUM: Study discussed by telephone with Dr. Vanita Panda in the ED on 11/21/2019 at 2252 hours. Electronically Signed   By: Genevie Ann M.D.   On: 11/21/2019 22:59   Result Date: 11/21/2019 CLINICAL DATA:  69 year old male with confusion, left upper extremity weakness. Plain head CT raising the possibility of small left parietal infarct. History of lung cancer EXAM: CT ANGIOGRAPHY HEAD AND NECK TECHNIQUE: Multidetector CT imaging of the head and neck was performed using the standard protocol during bolus administration of intravenous contrast. Multiplanar CT image reconstructions and MIPs were obtained to evaluate the vascular anatomy. Carotid stenosis measurements (when applicable) are obtained utilizing NASCET criteria, using the distal internal carotid diameter as the denominator. CONTRAST:  26m OMNIPAQUE IOHEXOL 350 MG/ML SOLN COMPARISON:  Plain head CT 1650 hours today. CTA head and neck 02/16/2017. Recent CTA chest 11/16/2019. FINDINGS: CTA NECK Skeleton: Stable visualized osseous structures. Degenerative appearing heterogeneous sclerosis in the cervical spine. 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 Continued patchy opacity in the visible superior segment of the left lower lobe (series 6, image 157) from CTA 5 days ago. Other neck: Limited by extensive paravertebral and other bilateral neck venous contrast reflux. No neck mass or lymphadenopathy is evident. Aortic arch: Stable aortic arch since 2019 with mild atherosclerosis. Three vessel arch configuration. Right carotid system: Patent right CCA origin but high-grade right CCA stenosis in the lower neck at the level of of of the  thyroid over a segment of about 14 mm, see series 9, image 158. Radiographic string sign stenosis on series 6, image 132 this is chronic but progressed since 2019. The right CCA remains patent. Negative right carotid bifurcation. Right ICA is patent without significant plaque or stenosis. Left carotid system: Patent left CCA origin but similar abrupt radiographic string sign stenosis of the left CCA in the lower neck (series 6, image 135 and series 9, image 148 over a segment of about 15 mm. Despite this the left CCA remains patent. This stenosis has not significantly changed since 2019. Negative left carotid bifurcation.  No cervical left ICA stenosis. Vertebral arteries: Limited cervical vertebral artery detail related to abundant paravertebral venous contrast, especially on the right where the vertebral is non dominant and diminutive. The right vertebral does remain patent to the skull base. Proximal left subclavian artery and left vertebral origin are within normal limits. Tortuous left V1. Dominant left  vertebral artery is patent to the skull base without visible stenosis. CTA HEAD Posterior circulation: Dominant left vertebral primarily supplies the basilar. Patent left PICA origin. Diminutive right vertebral artery is patent to the vertebrobasilar junction with patent PICA origin, stable since 2019. Patent basilar artery without stenosis. Patent SCA and PCA origins. Posterior communicating arteries are diminutive or absent. Bilateral PCA branches are within normal limits. Anterior circulation: Both ICA siphons are patent. There is moderate supraclinoid segment atherosclerosis on the left resulting in mild supraclinoid stenosis. Cavernous and supraclinoid plaque and irregularity on the right also with mild stenosis. Patent carotid termini. Patent MCA and ACA origins. Dominant right ACA A1 as before. Anterior communicating artery and bilateral ACA branches are stable and within normal limits. Left MCA M1  segment and bifurcation are patent without stenosis. Left MCA branches are stable and within normal limits. Right MCA M1 segment and bifurcation are patent without stenosis. Right MCA branches are stable and within normal limits. Venous sinuses: Intracranial venous structures are patent. Left IJ venous reflux extends into the head as far as the torcula. Left upper extremity contrast injection with extensive paravertebral and other bilateral neck venous contrast reflux. 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 In particular, there is a suspicious pattern of contrast reflux into the left IJ, with the round central filling defect within the vein (series 6, image 100) which seems to abate at the angle of the mandible. Anatomic variants: Dominant left vertebral artery primarily supplies the basilar. Dominant right ACA A1. Review of the MIP images confirms the above findings IMPRESSION: 1. Negative for large vessel occlusion. 2. 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 Bilateral CCA RADIOGRAPHIC STRING SIGNS, stable on the left but progressed on the right since a 2019 CTA. Recommend Vascular Surgery consultation. 3. 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 Left neck Venous Doppler Ultrasound should be confirmatory. 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 4. No carotid bifurcation plaque or stenosis. Bilateral ICA siphon plaque with only mild stenosis. Otherwise intracranial anterior and posterior circulation within normal limits. 5. 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 Continued abnormal opacity in the  superior segment of the left lower lobe from the Chest CTA 5 days ago. Electronically Signed: By: Genevie Ann M.D. On: 11/21/2019 22:42   CT ANGIO CHEST PE W OR WO CONTRAST  Result Date: 11/16/2019 CLINICAL DATA:  Shortness of breath EXAM: CT ANGIOGRAPHY CHEST WITH CONTRAST TECHNIQUE: Multidetector CT imaging of the chest was performed using the standard protocol during bolus administration of intravenous contrast. Multiplanar CT image reconstructions and MIPs were obtained to evaluate the vascular anatomy. CONTRAST:  127m OMNIPAQUE IOHEXOL 350 MG/ML SOLN COMPARISON:  Chest x-ray from earlier in the same day, CT from 10/28/2019. FINDINGS: Cardiovascular: Thoracic aorta demonstrates atherosclerotic calcification without aneurysmal dilatation. No cardiac enlargement is seen. The pulmonary artery shows a normal branching pattern bilaterally. Some attenuation of the left upper lobe arterial branches is seen related to post radiation changes stable from the prior exam. Similar changes are noted in the upper lobe branches on the right related to post radiation change. No definitive filling defects to suggest pulmonary embolism  are noted. No coronary calcifications are seen. Thoracic inlet is within normal limits. Mediastinum/Nodes: Thoracic inlet is within normal limits. There is again noted increased soft tissue density along the mediastinum bilaterally related to post radiation change. The hilar fullness seen on the right on the prior exam is less prominent on today's study. The esophagus is within normal limits. Lungs/Pleura: Lungs are well aerated bilaterally. Stable scarring in the lungs is noted along the mediastinum consistent with post radiation change. Persistent airspace change in the left lower lobe superior segment is noted similar changes are noted in the right lower lobe laterally and stable. These may also represent areas of scarring given their long-term stability. No sizable effusion or acute infiltrate  is seen. Emphysematous changes are again seen. Upper Abdomen: Visualized upper abdomen is within normal limits. Musculoskeletal: No acute bony abnormality is seen. Review of the MIP images confirms the above findings. IMPRESSION: No evidence of pulmonary emboli. Overall stable appearance of scarring in the paramediastinal regions bilaterally. This attenuates some of the pulmonary arterial branches although no emboli are seen. Patchy airspace disease in the superior segment of the left lower lobe as well as the right lower lobe stable from the prior exam. Persistent infiltrate could not be totally excluded although these changes may represent scarring. Aortic Atherosclerosis (ICD10-I70.0) and Emphysema (ICD10-J43.9). Electronically Signed   By: Inez Catalina M.D.   On: 11/16/2019 18:02   MR ANGIO HEAD WO CONTRAST  Result Date: 11/22/2019 CLINICAL DATA:  Stroke, follow-up. Brain mass or lesion, history of small cell lung cancer, rule out metastases. EXAM: MRI HEAD WITH CONTRAST MRA HEAD WITHOUT CONTRAST TECHNIQUE: Multiplanar, multiecho pulse sequences of the brain and surrounding structures were obtained without and with intravenous contrast. Angiographic images of the head were obtained using MRA technique without contrast. CONTRAST:  65m GADAVIST GADOBUTROL 1 MMOL/ML IV SOLN COMPARISON:  Noncontrast head CT 11/21/2019, noncontrast brain MRI 11/21/2019, CT angiogram head/neck 11/21/2019. Brain MRI 08/29/2018. FINDINGS: MRI HEAD FINDINGS The examination is markedly motion degraded with severe or moderate/severe motion degradation of all post-contrast sequences. This precludes adequate evaluation for intracranial metastatic disease. There is predominantly gyriform enhancement within the left temporal occipital lobes (series 9, image 8). No other definite sites of abnormal intracranial enhancement are identified. MRA HEAD FINDINGS The examination is moderate to severely motion degraded. This precludes adequate  evaluation for intracranial arterial stenoses and limits evaluation for aneurysms. No intracranial large vessel occlusion is identified. Faint flow related signal is seen arising from the intracranial right vertebral artery. In correlating with the prior CTA of 11/21/2019, this vessel is developmentally diminutive. IMPRESSION: MRI brain: 1. Markedly motion degraded examination with severe or moderate/severe motion degradation of all acquired post-contrast sequences. This precludes adequate evaluation for intracranial metastatic disease, and a repeat examination is recommended when the patient is better able to tolerate study. 2. Predominantly gyriform enhancement within the left temporal occipital lobes. This may be related to the known acute/early subacute infarct at this site. However, attention is recommended on follow-up. 3. No other definite sites of abnormal intracranial enhancement are identified. MRA head: 1. Moderate to severely motion degraded examination, precluding adequate evaluation for intracranial arterial stenoses and limiting evaluation for aneurysms. 2. No intracranial large vessel occlusion is identified. Electronically Signed   By: KKellie SimmeringDO   On: 11/22/2019 07:59   MR BRAIN WO CONTRAST  Result Date: 11/21/2019 CLINICAL DATA:  Initial evaluation for neuro deficit, stroke suspected. EXAM: MRI HEAD WITHOUT CONTRAST TECHNIQUE: Multiplanar, multiecho pulse  sequences of the brain and surrounding structures were obtained without intravenous contrast. COMPARISON:  Prior CT from earlier the same day. FINDINGS: Brain: Examination degraded by motion artifact. Cerebral volume within normal limits for age. Single tiny remote left cerebellar infarct noted. Patchy and confluent area of restricted diffusion seen involving the left temporal occipital region, consistent with an acute ischemic infarct (series 5, image 84). Associated minimal petechial hemorrhage without hemorrhagic transformation or  significant mass effect. Additional scattered predominantly subcentimeter cortical and subcortical ischemic infarcts seen involving the frontal and parietal lobes bilaterally, somewhat linear in watershed in distribution (series 5, images 98, 92, 89). No associated hemorrhage or mass effect. No infratentorial ischemic changes. No other foci of susceptibility artifact to suggest acute or chronic intracranial hemorrhage. No mass lesion, midline shift or mass effect. No hydrocephalus or extra-axial fluid collection. Pituitary gland suprasellar region within normal limits. Midline structures intact. Vascular: Major intracranial vascular flow voids are grossly maintained. Skull and upper cervical spine: Craniocervical junction within normal limits. Bone marrow signal intensity normal. No scalp soft tissue abnormality. Sinuses/Orbits: Globes and orbital soft tissues within normal limits. Mild mucosal thickening noted within the maxillary sinuses. Paranasal sinuses are otherwise clear. No mastoid effusion. Other: None. IMPRESSION: 1. Acute ischemic infarct involving the left temporoccipital region, corresponding with abnormality on prior CT, and consistent with an acute ischemic infarct. Additional scattered cortical and subcortical infarcts involving the frontal and parietal lobes bilaterally, watershed in distribution. Constellation of findings suggest a recent hypotensive episode/hypoperfusion injury. Associated minimal petechial hemorrhage at the left temporoccipital infarct. No other associated hemorrhage or mass effect. 2. Underlying tiny remote left cerebellar infarct. Otherwise negative brain MRI for age. Electronically Signed   By: Jeannine Boga M.D.   On: 11/21/2019 20:16   MR BRAIN W CONTRAST  Result Date: 11/22/2019 CLINICAL DATA:  Stroke, follow-up. Brain mass or lesion, history of small cell lung cancer, rule out metastases. EXAM: MRI HEAD WITH CONTRAST MRA HEAD WITHOUT CONTRAST TECHNIQUE:  Multiplanar, multiecho pulse sequences of the brain and surrounding structures were obtained without and with intravenous contrast. Angiographic images of the head were obtained using MRA technique without contrast. CONTRAST:  54m GADAVIST GADOBUTROL 1 MMOL/ML IV SOLN COMPARISON:  Noncontrast head CT 11/21/2019, noncontrast brain MRI 11/21/2019, CT angiogram head/neck 11/21/2019. Brain MRI 08/29/2018. FINDINGS: MRI HEAD FINDINGS The examination is markedly motion degraded with severe or moderate/severe motion degradation of all post-contrast sequences. This precludes adequate evaluation for intracranial metastatic disease. There is predominantly gyriform enhancement within the left temporal occipital lobes (series 9, image 8). No other definite sites of abnormal intracranial enhancement are identified. MRA HEAD FINDINGS The examination is moderate to severely motion degraded. This precludes adequate evaluation for intracranial arterial stenoses and limits evaluation for aneurysms. No intracranial large vessel occlusion is identified. Faint flow related signal is seen arising from the intracranial right vertebral artery. In correlating with the prior CTA of 11/21/2019, this vessel is developmentally diminutive. IMPRESSION: MRI brain: 1. Markedly motion degraded examination with severe or moderate/severe motion degradation of all acquired post-contrast sequences. This precludes adequate evaluation for intracranial metastatic disease, and a repeat examination is recommended when the patient is better able to tolerate study. 2. Predominantly gyriform enhancement within the left temporal occipital lobes. This may be related to the known acute/early subacute infarct at this site. However, attention is recommended on follow-up. 3. No other definite sites of abnormal intracranial enhancement are identified. MRA head: 1. Moderate to severely motion degraded examination, precluding adequate evaluation for  intracranial  arterial stenoses and limiting evaluation for aneurysms. 2. No intracranial large vessel occlusion is identified. Electronically Signed   By: Kellie Simmering DO   On: 11/22/2019 07:59   IR INTRAVSC STENT CERV CAROTID W/O EMB-PROT MOD SED  Result Date: 12/07/2019 CLINICAL DATA:  Patient with right cerebral hemisphere stroke secondary to severe right common carotid artery stenosis. Also history of left common carotid artery stenosis. EXAM: INTRAVSC STENT CERV CAROTID W/O EMB-PROT COMPARISON:  Diagnostic catheter arteriogram of November 23, 2019. MEDICATIONS: Heparin 3,500 units IV. Ancef 2 g IV antibiotic was administered within 1 hour of the procedure. ANESTHESIA/SEDATION: Mac anesthesia as per anesthesia service. CONTRAST:  Isovue 300 approximately 120 mL FLUOROSCOPY TIME:  Fluoroscopy Time: 25 minutes 42 seconds (5579 mGy). COMPLICATIONS: None immediate. TECHNIQUE: Informed written consent was obtained from the patient after a thorough discussion of the procedural risks, benefits and alternatives. All questions were addressed. Maximal Sterile Barrier Technique was utilized including caps, mask, sterile gowns, sterile gloves, sterile drape, hand hygiene and skin antiseptic. A timeout was performed prior to the initiation of the procedure. The left groin was prepped and draped in the usual sterile fashion. Thereafter using modified Seldinger technique, transfemoral access into the left common femoral artery was obtained without difficulty. Over a 0.035 inch guidewire, a 5 French Pinnacle sheath was inserted. Through this, and also over 0.035 inch guidewire, a 5 [place] JB 1 catheter was advanced to the aortic arch region and selectively positioned in the innominate artery, and the right common carotid artery. FINDINGS: The innominate arteriogram demonstrates the origin of the right subclavian artery to be widely patent. The right common carotid artery in its proximal 1/3 is widely patent. Distal to this there is  severe smooth tapered narrowing with more distal right common carotid artery proximal to the bifurcation to be widely patent. The right internal carotid artery at the bulb to the cranial skull base is also widely patent. Partial opacification of the right external carotid artery origin is seen. More distally the right internal carotid artery is seen to opacify widely to the cranial skull base. The petrous, the cavernous and the supraclinoid segments are widely patent. The right middle cerebral artery and the right anterior cerebral artery opacify into the capillary and venous phases. Prompt opacification via the anterior communicating artery of the left anterior cerebral A2 segment and distally is seen. ENDOVASCULAR REVASCULARIZATION OF SYMPTOMATIC SEVERE RIGHT COMMON CAROTID ARTERY STENOSIS WITH STENT ASSISTED ANGIOPLASTY The 0.035 inch Roadrunner guidewire was then gently advanced without any difficulty to the right common carotid bifurcation followed by the JB 1 catheter. The wire was removed. Good aspiration obtained from the hub of the 5 French diagnostic catheter. A gentle control arteriogram through this demonstrates safe positioning of the tip of the diagnostic catheter. The diagnostic catheter was then exchanged for an 014 inch 300 cm BMW micro guidewire. The micro guidewire tip was engaged in the right external carotid artery. The 5 French Pinnacle sheath was then replaced with an 8 [place] short Pinnacle sheath. This was then connected to continuous heparinized saline infusion. Over the exchange micro guidewire, an 80 cm 6 French Cook shuttle sheath was advanced and positioned just proximal to the severe right common carotid stenosis. A gentle control arteriogram performed through this demonstrated safe position of the tip of the [employer] sheath. Measurements were performed of the right common carotid artery distal to the severe stenosis and proximally. It was decided to proceed with placement of a 6/8  x 40 mm Xact stent. This was prepped and purged with heparinized saline infusion in its housing. Using the rapid exchange technique, the delivery system of the stent was advanced without difficulty and positioned distal and proximally in the chosen positions. Once there, the stent was deployed in the usual manner without any difficulty. The delivery system was then retrieved and removed. A control arteriogram performed through the [facility] shuttle sheath in the proximal right common carotid artery demonstrated excellent apposition distally and proximally with the presence of a waist in the mid segment. This prompted the advancement of a 5 mm x 30 mm Viatrac 14 balloon which had been prepped and purged with 50% contrast and 50% heparinized saline infusion. Again this was using the rapid exchange technique. The proximal and the distal markers were positioned adequate distant from the site of the intra stent stenosis. Control angioplasty was then performed using the micro inflation syringe device via micro tubing. Balloon was inflated to 10 atmospheres where it was maintained for approximately 10 seconds deflated and retrieved and removed. During this time the patient did not exhibit any change in his neurological status. The balloon was retrieved and removed. A control arteriogram performed medially and subsequently at 50 minutes later continued to demonstrate excellent flow through the stented segment without evidence of intraluminal filling defects. More distally, no evidence of intracranial filling defects or of occlusion was seen. There was an appreciably improved hemodynamic flow into the intracranial compartment with brisk flow into the mid MCA and ACA distributions and also into the contralateral left anterior cerebral artery. Under constant fluoroscopic guidance, the exchange micro guidewire was removed. A final control arteriogram performed through the [facility] shuttle sheath in the right common carotid artery  demonstrated continued improved flow extra cranially and intracranially. [facility] shuttle sheath was removed. The 8 French Pinnacle sheath was removed with manual compression used to perform hemostasis over about 30 minutes. Distal pulses on the left side remained Dopplerable unchanged. Neurologically the patient was stable. On pronation there was improved power in the left upper extremity with excellent hand grip, and dorsal and plantar flexion of the left foot. The patient denied any headaches, nausea, vomiting or visual changes. He was then transferred to the neuro ICU for post revascularization management. IMPRESSION: Status post endovascular revascularization of symptomatic severe right common carotid artery stenosis with stent assisted angioplasty. PLAN: Follow-up in the clinic 2 to 3 weeks post discharge. Electronically Signed   By: Luanne Bras M.D.   On: 12/06/2019 17:24   IR US Guide Vasc Access Right  Result Date: 11/26/2019 CLINICAL DATA:  Left-sided weakness arm greater than leg. CT angiogram of the head and neck demonstrating high-grade stenosis of the common carotid arteries bilaterally. EXAM: BILATERAL COMMON CAROTID AND INNOMINATE ANGIOGRAPHY COMPARISON:  CT angiogram of the head and neck of November 30, 2019. MEDICATIONS: Heparin 2000 units IA. No antibiotic was administered within 1 hour of the procedure. ANESTHESIA/SEDATION: Versed 0.5 mg IV; Fentanyl 12.5 mcg IV Moderate Sedation Time:  35 minutes The patient was continuously monitored during the procedure by the interventional radiology nurse under my direct supervision. CONTRAST:  Omnipaque 300 approximately 65 mL FLUOROSCOPY TIME:  Fluoroscopy Time: 10 minutes 36 seconds (530 mGy). COMPLICATIONS: None immediate. TECHNIQUE: Informed written consent was obtained from the patient after a thorough discussion of the procedural risks, benefits and alternatives. All questions were addressed. Maximal Sterile Barrier Technique was utilized  including caps, mask, sterile gowns, sterile gloves, sterile drape, hand hygiene and skin  antiseptic. A timeout was performed prior to the initiation of the procedure. The right forearm to the wrist was prepped and draped in the usual sterile manner. The right radial artery was then identified with ultrasound, and its morphology documented. A dorsal palmar anastomosis was verified to be present. Using ultrasound guidance, radial access was obtained using a micropuncture set over a 0.018 inch micro guidewire. A 4/5 French radial sheath was then inserted. The micro guidewire, and the obturator were removed. Good aspiration obtained from the side port of the radial sheath. A cocktail of 1000 units of heparin, and 200 mcg of nitroglycerin was then infused through the radial sheath without event. A radial arteriogram was performed. Over a 0.035 inch Roadrunner guidewire, a Simmons 2 diagnostic catheter was then advanced to the aortic arch region, and selectively positioned in the right common carotid artery, the left common carotid artery, the left vertebral artery and the right subclavian artery. Following the procedure, hemostasis at the right radial puncture site was achieved with a wrist band. Distal right radial pulse was verified to be present. FINDINGS: The right common carotid arteriogram demonstrates segmental high-grade stenosis of 90% of the right common carotid artery at the junction of the middle and proximal 1/3 is seen. Distal to this opacification of the right common carotid artery and the right internal carotid artery is seen. There is a mild stenosis at the origin of the right internal carotid artery. More distally the petrous, the cavernous and the supraclinoid segments are widely patent. The right middle cerebral artery and the right anterior cerebral artery opacify into the capillary and venous phases. Cross-filling via the anterior communicating artery of the left anterior cerebral A2 segment and  distally is seen. No opacification of the right external carotid artery at its origin is seen. The right subclavian arteriogram demonstrates hypoplastic right vertebral artery with flow seen to the cranial skull base. Retrograde opacification of the right external carotid artery territory is seen via the ipsilateral right occipital artery. The left common carotid arteriogram demonstrates severe segmental stenosis of 90% plus at the junction of the middle and proximal third of the left common carotid artery. More distally post stenotic dilatation is seen. Patency is seen of the left internal carotid artery at the bulb to the cranial skull base. The petrous, the cavernous and the supraclinoid segments are widely patent. The left middle cerebral artery and the left anterior cerebral artery opacify into the capillary and venous phases. Non opacification of the left external carotid artery is seen. The dominant left vertebral artery origin is widely patent. The vessel opacifies to the cranial skull base. Patency is seen of the left vertebrobasilar junction, and the left posterior-inferior cerebellar artery. There is retrograde opacification of the left external carotid artery distribution via multiple musculoskeletal collaterals arising from the distal left vertebral artery. More distally the left vertebrobasilar junction, the basilar artery, the posterior cerebral arteries, the superior cerebellar arteries and the anterior-inferior cerebellar arteries is seen into the capillary and venous phases. Retrograde opacification of the right vertebrobasilar junction proximal to the right posterior-inferior cerebellar artery is seen. Partial retrograde opacification via the left posterior communicating artery of the left middle cerebral artery distribution is seen. 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 PLAN: Findings reviewed  with the patient and referring neurologist. Given the symptomatic nature of the right common carotid artery stenosis, endovascular revascularization scheduled for early next week. Electronically Signed   By: Luanne Bras M.D.   On: 11/23/2019 16:25   DG CHEST PORT 1 VIEW  Result Date: 12/13/2019 CLINICAL DATA:  Respiratory distress EXAM: PORTABLE CHEST 1 VIEW COMPARISON:  12/12/2019 FINDINGS: Elevation of the left hemidiaphragm and left-sided volume loss persists. There is resultant vascular crowding within the left lung. Right lung is clear. No pneumothorax or pleural effusion. Right carotid stent again noted. Cardiac size within normal limits. Paramediastinal soft tissue may relate to prior radiation therapy and is stable. Stable superior retraction of the hila bilaterally. No acute bone abnormality. IMPRESSION: Stable examination with probable paramediastinal post radiation changes at the thoracic inlet and elevation of the left hemidiaphragm. No acute abnormality identified. Electronically Signed   By: Fidela Salisbury MD   On: 12/13/2019 01:21   DG CHEST PORT 1 VIEW  Result Date: 12/12/2019 CLINICAL DATA:  Shortness of breath; history lung cancer EXAM: PORTABLE CHEST 1 VIEW COMPARISON:  12/07/2019 FINDINGS: Normal heart size and pulmonary vascularity. RIGHT upper lobe volume loss and scarring likely related to lung cancer therapy, stable. Accentuated perihilar markings again seen. Elevation of LEFT diaphragm, slightly increased. No definite acute infiltrate, pleural effusion or pneumothorax. RIGHT carotid stent. IMPRESSION: Chronic accentuation of perihilar markings with RIGHT upper lobe volume loss/scarring likely related to prior cancer treatment. Increased elevation of LEFT diaphragm. No additional acute abnormalities.  Electronically Signed   By: Lavonia Dana M.D.   On: 12/12/2019 14:56   DG CHEST PORT 1 VIEW  Result Date: 12/07/2019 CLINICAL DATA:  Dyspnea. EXAM: PORTABLE CHEST 1 VIEW COMPARISON:  December 06, 2019. FINDINGS: The heart size and mediastinal contours are within normal limits. Feeding tube is seen entering stomach. No pneumothorax or pleural effusion is noted. Left lung is clear. Ill-defined nodular opacity seen in right midlung is again noted and may represent focal inflammation. The visualized skeletal structures are unremarkable. IMPRESSION: Ill-defined nodular opacity seen in right midlung is again noted and may represent focal inflammation. Electronically Signed   By: Marijo Conception M.D.   On: 12/07/2019 08:40   DG Chest Port 1 View  Result Date: 12/06/2019 CLINICAL DATA:  Fluid overload.  Hypotension. EXAM: PORTABLE CHEST 1 VIEW COMPARISON:  Radiograph 11/28/2019.  CT 11/16/2019. FINDINGS: Enteric tube in place with tip and side-port below the diaphragm in the stomach. The heart is normal in size. Stable mediastinal contours with right hilar prominence corresponding to ill-defined soft tissue on CT. Widening of the upper mediastinal contours corresponds to post radiation change. Vascular stent in the region of the right carotid. Chronic volume loss in the left hemithorax. Slight increase in interstitial markings which may represent pulmonary edema. No significant pleural effusion. No pneumothorax. Nonspecific vague nodular opacity in the right mid lung. The bones are under mineralized. IMPRESSION: 1. Enteric tube in place with tip and side-port below the diaphragm in the stomach. 2. Slight increase in interstitial markings which may represent pulmonary edema given history of fluid overload. 3. Nonspecific ill-defined nodular opacity in the right mid lung. 4. Stable radiographic appearance of post treatment related changes. Electronically Signed   By: Keith Rake M.D.   On: 12/06/2019 17:15   DG  Chest Port 1 View  Result Date: 11/28/2019 CLINICAL DATA:  Dyspnea EXAM: PORTABLE CHEST 1 VIEW COMPARISON:  Prior chest x-ray 11/25/2019 FINDINGS: Stable chronic atelectasis of the right  upper lobe resulting in hyperinflation of the right lower and right middle lobes. Similar appearance of mild patchy airspace opacities in the right lung base. No significant interval progression. Cardiac and mediastinal contours remain unchanged. Chronic bronchitic changes are unchanged. No pleural effusion or pneumothorax. No acute osseous abnormality. IMPRESSION: Stable chest x-ray without significant interval change compared to 11/25/2019. Electronically Signed   By: Jacqulynn Cadet M.D.   On: 11/28/2019 09:08   DG CHEST PORT 1 VIEW  Result Date: 11/25/2019 CLINICAL DATA:  Pneumonia, history of lung cancer. EXAM: PORTABLE CHEST 1 VIEW COMPARISON:  November 22, 2019. FINDINGS: Stable cardiomediastinal silhouette. No pneumothorax or pleural effusion is noted. Left lung is clear. Slightly increased right basilar opacity is noted concerning for possible atelectasis or infiltrate. Stable bilateral hilar findings consistent with retraction or fibrosis. Bony thorax is unremarkable. IMPRESSION: Slightly increased right basilar opacity is noted concerning for possible atelectasis or infiltrate. Electronically Signed   By: Marijo Conception M.D.   On: 11/25/2019 12:24   DG CHEST PORT 1 VIEW  Result Date: 11/22/2019 CLINICAL DATA:  Dyspnea. EXAM: PORTABLE CHEST 1 VIEW COMPARISON:  November 21, 2019. FINDINGS: The heart size and mediastinal contours are within normal limits. No pneumothorax or pleural effusion is noted. Stable findings consistent with bilateral hilar retraction. Right apical pleural thickening is noted which is unchanged. No acute pulmonary disease is noted. The visualized skeletal structures are unremarkable. IMPRESSION: No active disease. Electronically Signed   By: Marijo Conception M.D.   On: 11/22/2019 19:57    DG Chest Port 1 View  Result Date: 11/21/2019 CLINICAL DATA:  Shortness of breath EXAM: PORTABLE CHEST 1 VIEW COMPARISON:  11/16/2019, CT 11/16/2019, PET CT 03/30/2019 FINDINGS: No acute consolidation or effusion. Bilateral hilar retraction with bilateral upper paramediastinal opacity without change. Stable cardiac size. No pneumothorax IMPRESSION: No active disease. Similar appearance of bilateral hilar retraction and upper paramediastinal opacity. Electronically Signed   By: Donavan Foil M.D.   On: 11/21/2019 20:54   DG Swallowing Func-Speech Pathology  Result Date: 11/25/2019 Objective Swallowing Evaluation: Type of Study: MBS-Modified Barium Swallow Study  Patient Details Name: MANAS HICKLING MRN: [id number] Date of Birth: 1950-06-13 Today's Date: 11/25/2019 Time: SLP Start Time (ACUTE ONLY): 1100 -SLP Stop Time (ACUTE ONLY): 1130 SLP Time Calculation (min) (ACUTE ONLY): 30 min Past Medical History: Past Medical History: Diagnosis Date . Anxiety  . Complication of anesthesia   " sometimes I wake up during surgery " . Depression  . Left renal mass 07/19/2016 . Lung cancer ([place]) dx'd 2010   NSCL CA right . Lung cancer ([place]) dx'd 2017  left Past Surgical History: Past Surgical History: Procedure Laterality Date . AMPUTATION Right 05/21/2012  Procedure: AMPUTATION BELOW KNEE;  Surgeon: Newt Minion, MD;  Location: [facility];  Service: Orthopedics;  Laterality: Right; . AMPUTATION Right 06/21/2012  Procedure: Revision AMPUTATION BELOW KNEE Right;  Surgeon: Newt Minion, MD;  Location: [facility];  Service: Orthopedics;  Laterality: Right;  Revision right Below Knee Amputation . COLONOSCOPY W/ BIOPSIES AND POLYPECTOMY   . FOOT AMPUTATION Right   traumatic right lower extremity . UPPER JAW    SURGERY FOR INFECTION HPI: Evan [name] is a 69 y.o. 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 Now with worsening dysphagia over past 24-48 hours.  Subjective: alert but not very interactive Assessment / Plan / Recommendation CHL IP CLINICAL IMPRESSIONS 11/25/2019 Clinical Impression Patient presents with a moderate oropharyngeal dysphagia without aspiration but with incident of very trace penetration during swallow with thin liquids. Oral phase is characterized by weak oral control and movement of boluses, mastication was decreased and resulted in prolonged oral phase with regular solids. He exhibited premature spillage of thin and nectar liquids, delay in swallow initiation to level of vallecular sinus with puree solids and pyriform sinus with thin liquids. Inital swallows of puree solids, regular solids resulted in moderate vallecular residuals and mild pharyngeal residuals. Patient independently initiated swallow which helped clear residuals and sips of thin liquids helped clear residuals in pharynx to trace to minimal in amount. Overall, patient's airway is well protected during swallow with all tested consistencies and he is safe to initiate oral diet at at this time. SLP Visit Diagnosis Dysphagia, oropharyngeal phase (R13.12) Attention and concentration deficit following -- Frontal lobe and executive function deficit following -- Impact on safety and function Mild aspiration risk   CHL IP TREATMENT RECOMMENDATION 11/25/2019 Treatment Recommendations Therapy as outlined in treatment plan below   Prognosis 11/25/2019 Prognosis for Safe Diet Advancement Good Barriers to Reach Goals -- Barriers/Prognosis Comment -- CHL IP DIET RECOMMENDATION 11/25/2019 SLP Diet Recommendations Thin liquid;Dysphagia 1 (Puree) solids Liquid Administration via Cup;Straw  Medication Administration Whole meds with puree Compensations Minimize environmental distractions;Slow rate;Small sips/bites;Follow solids with liquid Postural Changes Remain semi-upright after after feeds/meals (Comment);Seated upright at 90 degrees   CHL IP OTHER RECOMMENDATIONS 11/25/2019 Recommended Consults -- Oral Care Recommendations Oral care BID Other Recommendations --   CHL IP FOLLOW UP RECOMMENDATIONS 11/25/2019 Follow up Recommendations 24 hour supervision/assistance;Skilled Nursing facility;Home health SLP   CHL IP FREQUENCY AND DURATION 11/25/2019 Speech Therapy Frequency (ACUTE ONLY) min 2x/week Treatment Duration --      CHL IP ORAL PHASE 11/25/2019 Oral Phase Impaired Oral - Pudding Teaspoon -- Oral - Pudding Cup -- Oral - Honey Teaspoon -- Oral - Honey Cup -- Oral - Nectar Teaspoon -- Oral - Nectar Cup Weak lingual manipulation;Premature spillage Oral - Nectar Straw -- Oral - Thin Teaspoon -- Oral - Thin Cup Premature spillage Oral - Thin Straw Premature spillage Oral - Puree Premature spillage Oral - Mech Soft -- Oral - Regular Delayed oral transit;Weak lingual manipulation Oral - Multi-Consistency -- Oral - Pill Impaired mastication;Weak lingual manipulation;Delayed oral transit Oral Phase - Comment --  CHL IP PHARYNGEAL PHASE 11/25/2019 Pharyngeal Phase Impaired Pharyngeal- Pudding Teaspoon -- Pharyngeal -- Pharyngeal- Pudding Cup -- Pharyngeal -- Pharyngeal- Honey Teaspoon -- Pharyngeal -- Pharyngeal- Honey Cup -- Pharyngeal -- Pharyngeal- Nectar Teaspoon -- Pharyngeal -- Pharyngeal- Nectar Cup Delayed swallow initiation-vallecula;Pharyngeal residue - valleculae;Pharyngeal residue - pyriform Pharyngeal -- Pharyngeal- Nectar Straw -- Pharyngeal -- Pharyngeal- Thin Teaspoon -- Pharyngeal -- Pharyngeal- Thin Cup Delayed swallow initiation-vallecula;Delayed swallow initiation-pyriform sinuses;Penetration/Aspiration during swallow;Pharyngeal residue - valleculae;Pharyngeal residue - pyriform  Pharyngeal Material enters airway, remains ABOVE vocal cords then ejected out Pharyngeal- Thin Straw Delayed swallow initiation-pyriform sinuses;Delayed swallow initiation-vallecula Pharyngeal -- Pharyngeal- Puree Delayed swallow initiation-vallecula;Pharyngeal residue - valleculae Pharyngeal -- Pharyngeal- Mechanical Soft -- Pharyngeal -- Pharyngeal- Regular Pharyngeal residue - valleculae Pharyngeal -- Pharyngeal- Multi-consistency -- Pharyngeal -- Pharyngeal- Pill WFL Pharyngeal -- Pharyngeal Comment --  CHL IP CERVICAL ESOPHAGEAL PHASE 11/25/2019 Cervical Esophageal Phase WFL Pudding Teaspoon -- Pudding Cup --  Honey Teaspoon -- Honey Cup -- Nectar Teaspoon -- Nectar Cup -- Nectar Straw -- Thin Teaspoon -- Thin Cup -- Thin Straw -- Puree -- Mechanical Soft -- Regular -- Multi-consistency -- Pill -- Cervical Esophageal Comment -- Sonia Baller, MA, CCC-SLP Speech Therapy [facility]             ECHOCARDIOGRAM COMPLETE  Result Date: 11/22/2019    ECHOCARDIOGRAM REPORT   Patient Name:   AAYANSH CODISPOTI Date of Exam: 11/22/2019 Medical Rec #:  [id number]       Height:       65.0 in Accession #:    [id number]      Weight:       106.0 lb Date of Birth:  September 19, 1950       BSA:          1.510 m Patient Age:    53 years        BP:           117/76 mmHg Patient Gender: M               HR:           106 bpm. Exam Location:  Inpatient Procedure: 2D Echo, Cardiac Doppler and Color Doppler Indications:    Stroke 434.91 / I163.9  History:        Patient has no prior history of Echocardiogram examinations.                 COPD; Risk Factors:Current Smoker and Dyslipidemia.  Sonographer:    Bernadene Person RDCS Referring Phys: [place]  1. Left ventricular ejection fraction, by estimation, is 55 to 60%. The left ventricle has normal function. The left ventricle has no regional wall motion abnormalities. Left ventricular diastolic parameters are consistent with Grade I diastolic dysfunction  (impaired relaxation). Elevated left atrial pressure.  2. Right ventricular systolic function is normal. The right ventricular size is normal.  3. The mitral valve is normal in structure. Trivial mitral valve regurgitation. No evidence of mitral stenosis.  4. The aortic valve is normal in structure. Aortic valve regurgitation is not visualized. No aortic stenosis is present.  5. The inferior vena cava is normal in size with greater than 50% respiratory variability, suggesting right atrial pressure of 3 mmHg. Comparison(s): No prior Echocardiogram. FINDINGS  Left Ventricle: Left ventricular ejection fraction, by estimation, is 55 to 60%. The left ventricle has normal function. The left ventricle has no regional wall motion abnormalities. The left ventricular internal cavity size was normal in size. There is  no left ventricular hypertrophy. Left ventricular diastolic parameters are consistent with Grade I diastolic dysfunction (impaired relaxation). Elevated left atrial pressure. Right Ventricle: The right ventricular size is normal. No increase in right ventricular wall thickness. Right ventricular systolic function is normal. Left Atrium: Left atrial size was normal in size. Right Atrium: Right atrial size was normal in size. Pericardium: There is no evidence of pericardial effusion. Mitral Valve: The mitral valve is normal in structure. Trivial mitral valve regurgitation. No evidence of mitral valve stenosis. Tricuspid Valve: The tricuspid valve is normal in structure. Tricuspid valve regurgitation is trivial. No evidence of tricuspid stenosis. Aortic Valve: The aortic valve is normal in structure. Aortic valve regurgitation is not visualized. No aortic stenosis is present. Pulmonic Valve: The pulmonic valve was normal in structure. Pulmonic valve regurgitation is not visualized. No evidence of pulmonic stenosis. Aorta: The aortic root is normal in size and  structure. Venous: The inferior vena cava is normal in  size with greater than 50% respiratory variability, suggesting right atrial pressure of 3 mmHg. IAS/Shunts: No atrial level shunt detected by color flow Doppler.  LEFT VENTRICLE PLAX 2D LVIDd:         3.70 cm  Diastology LVIDs:         2.10 cm  LV e' medial:    3.92 cm/s LV PW:         0.80 cm  LV E/e' medial:  14.5 LV IVS:        0.80 cm  LV e' lateral:   9.67 cm/s LVOT diam:     2.10 cm  LV E/e' lateral: 5.9 LV SV:         68 LV SV Index:   45 LVOT Area:     3.46 cm  RIGHT VENTRICLE RV S prime:     12.00 cm/s TAPSE (M-mode): 1.9 cm LEFT ATRIUM             Index      RIGHT ATRIUM          Index LA diam:        1.90 cm 1.26 cm/m RA Area:     7.97 cm LA Vol (A2C):   12.0 ml 7.95 ml/m RA Volume:   14.60 ml 9.67 ml/m LA Vol (A4C):   12.9 ml 8.54 ml/m LA Biplane Vol: 12.6 ml 8.34 ml/m  AORTIC VALVE LVOT Vmax:   108.00 cm/s LVOT Vmean:  70.900 cm/s LVOT VTI:    0.196 m  AORTA Ao Root diam: 3.50 cm MITRAL VALVE MV Area (PHT): 2.37 cm    SHUNTS MV Decel Time: 320 msec    Systemic VTI:  0.20 m MV E velocity: 56.90 cm/s  Systemic Diam: 2.10 cm MV A velocity: 67.20 cm/s MV E/A ratio:  0.85 Ena Dawley MD Electronically signed by Ena Dawley MD Signature Date/Time: 11/22/2019/6:53:37 PM    Final    VAS [place] UPPER EXTREMITY VENOUS DUPLEX  Result Date: 12/07/2019 UPPER VENOUS STUDY  Indications: Swelling, Edema, and History of LUE DVT on 11/22/19 Other Indications: Status post right common carotid artery stenting on 12/06/2019. Risk Factors: Cancer Lung COPD, pneumonia. Comparison Study: 11/22/19 - LUE venous. Performing Technologist: Oda Cogan RDMS, RVT  Examination Guidelines: A complete evaluation includes B-mode imaging, spectral Doppler, color Doppler, and power Doppler as needed of all accessible portions of each vessel. Bilateral testing is considered an integral part of a complete examination. Limited examinations for reoccurring indications may be performed as noted.  Right Findings:  +----------+------------+---------+-----------+----------+-------+ RIGHT     CompressiblePhasicitySpontaneousPropertiesSummary +----------+------------+---------+-----------+----------+-------+ Subclavian               Yes       Yes                      +----------+------------+---------+-----------+----------+-------+  Left Findings: +----------+------------+---------+-----------+----------+-------+ LEFT      CompressiblePhasicitySpontaneousPropertiesSummary +----------+------------+---------+-----------+----------+-------+ IJV           None       No        No                Acute  +----------+------------+---------+-----------+----------+-------+ Subclavian    None       No        No                Acute  +----------+------------+---------+-----------+----------+-------+ Axillary      None  No        No                Acute  +----------+------------+---------+-----------+----------+-------+ Brachial    Partial                                  Acute  +----------+------------+---------+-----------+----------+-------+ Radial        Full                                          +----------+------------+---------+-----------+----------+-------+ Ulnar         Full                                          +----------+------------+---------+-----------+----------+-------+ Cephalic      Full                                          +----------+------------+---------+-----------+----------+-------+ Basilic       None                                   Acute  +----------+------------+---------+-----------+----------+-------+  Summary:  Right: No evidence of thrombosis in the subclavian.  Left: Findings consistent with acute deep vein thrombosis involving the left internal jugular vein, left subclavian vein, left axillary vein and left brachial veins. Findings consistent with acute superficial vein thrombosis involving the left basilic vein.  Findings for deep vein thrombosis appear unchanged from previous study.  *See table(s) above for measurements and observations.  Diagnosing physician: Servando Snare MD Electronically signed by Servando Snare MD on 12/07/2019 at 4:03:30 PM.    Final    VAS [place] UPPER EXTREMITY VENOUS DUPLEX  Result Date: 11/22/2019 UPPER VENOUS STUDY  Indications: Swelling, and Pain Risk Factors: Cancer Lung. Performing Technologist: Griffin Basil RCT RDMS  Examination Guidelines: A complete evaluation includes B-mode imaging, spectral Doppler, color Doppler, and power Doppler as needed of all accessible portions of each vessel. Bilateral testing is considered an integral part of a complete examination. Limited examinations for reoccurring indications may be performed as noted.  Left Findings: +----------+------------+---------+-----------+----------+-------+ LEFT      CompressiblePhasicitySpontaneousPropertiesSummary +----------+------------+---------+-----------+----------+-------+ IJV           None       No        No                       +----------+------------+---------+-----------+----------+-------+ Subclavian    None       No        No                       +----------+------------+---------+-----------+----------+-------+ Axillary      None       No        No                       +----------+------------+---------+-----------+----------+-------+ Brachial    Partial      Yes       Yes                      +----------+------------+---------+-----------+----------+-------+  Radial        Full                                          +----------+------------+---------+-----------+----------+-------+ Ulnar         Full                                          +----------+------------+---------+-----------+----------+-------+ Cephalic      Full                                          +----------+------------+---------+-----------+----------+-------+ Basilic       Full                                           +----------+------------+---------+-----------+----------+-------+  Summary:  Right: No evidence of thrombosis in the subclavian.  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 Previous Cat Scan Yesterday.  *See table(s) above for measurements and observations.  Diagnosing physician: Servando Snare MD Electronically signed by Servando Snare MD on 11/22/2019 at 4:11:26 PM.    Final    IR ANGIO INTRA EXTRACRAN SEL COM CAROTID INNOMINATE BILAT MOD SED  Result Date: 11/26/2019 CLINICAL DATA:  Left-sided weakness arm greater than leg. CT angiogram of the head and neck demonstrating high-grade stenosis of the common carotid arteries bilaterally. EXAM: BILATERAL COMMON CAROTID AND INNOMINATE ANGIOGRAPHY COMPARISON:  CT angiogram of the head and neck of November 30, 2019. MEDICATIONS: Heparin 2000 units IA. No antibiotic was administered within 1 hour of the procedure. ANESTHESIA/SEDATION: Versed 0.5 mg IV; Fentanyl 12.5 mcg IV Moderate Sedation Time:  35 minutes The patient was continuously monitored during the procedure by the interventional radiology nurse under my direct supervision. CONTRAST:  Omnipaque 300 approximately 65 mL FLUOROSCOPY TIME:  Fluoroscopy Time: 10 minutes 36 seconds (530 mGy). COMPLICATIONS: None immediate. TECHNIQUE: Informed written consent was obtained from the patient after a thorough discussion of the procedural risks, benefits and alternatives. All questions were addressed. Maximal Sterile Barrier Technique was utilized including caps, mask, sterile gowns, sterile gloves, sterile drape, hand hygiene and skin antiseptic. A timeout was performed prior to the initiation of the procedure. The right forearm to the wrist was prepped and draped in the usual sterile manner. The right radial artery was then identified with  ultrasound, and its morphology documented. A dorsal palmar anastomosis was verified to be present. Using ultrasound guidance, radial access was obtained using a micropuncture set over a 0.018 inch micro guidewire. A 4/5 French radial sheath was then inserted. The micro guidewire, and the obturator were removed. Good aspiration obtained from the side port of the radial sheath. A cocktail of 1000 units of heparin, and 200 mcg of nitroglycerin was then infused through the radial sheath without event. A radial arteriogram was performed. Over a 0.035 inch Roadrunner guidewire, a Simmons 2 diagnostic catheter was then advanced to the aortic arch region, and selectively positioned in the right common carotid artery, the left common carotid artery,  the left vertebral artery and the right subclavian artery. Following the procedure, hemostasis at the right radial puncture site was achieved with a wrist band. Distal right radial pulse was verified to be present. FINDINGS: The right common carotid arteriogram demonstrates segmental high-grade stenosis of 90% of the right common carotid artery at the junction of the middle and proximal 1/3 is seen. Distal to this opacification of the right common carotid artery and the right internal carotid artery is seen. There is a mild stenosis at the origin of the right internal carotid artery. More distally the petrous, the cavernous and the supraclinoid segments are widely patent. The right middle cerebral artery and the right anterior cerebral artery opacify into the capillary and venous phases. Cross-filling via the anterior communicating artery of the left anterior cerebral A2 segment and distally is seen. No opacification of the right external carotid artery at its origin is seen. The right subclavian arteriogram demonstrates hypoplastic right vertebral artery with flow seen to the cranial skull base. Retrograde opacification of the right external carotid artery territory is seen via  the ipsilateral right occipital artery. The left common carotid arteriogram demonstrates severe segmental stenosis of 90% plus at the junction of the middle and proximal third of the left common carotid artery. More distally post stenotic dilatation is seen. Patency is seen of the left internal carotid artery at the bulb to the cranial skull base. The petrous, the cavernous and the supraclinoid segments are widely patent. The left middle cerebral artery and the left anterior cerebral artery opacify into the capillary and venous phases. Non opacification of the left external carotid artery is seen. The dominant left vertebral artery origin is widely patent. The vessel opacifies to the cranial skull base. Patency is seen of the left vertebrobasilar junction, and the left posterior-inferior cerebellar artery. There is retrograde opacification of the left external carotid artery distribution via multiple musculoskeletal collaterals arising from the distal left vertebral artery. More distally the left vertebrobasilar junction, the basilar artery, the posterior cerebral arteries, the superior cerebellar arteries and the anterior-inferior cerebellar arteries is seen into the capillary and venous phases. Retrograde opacification of the right vertebrobasilar junction proximal to the right posterior-inferior cerebellar artery is seen. Partial retrograde opacification via the left posterior communicating artery of the left middle cerebral artery distribution is seen. 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 PLAN: Findings reviewed with the patient and referring neurologist. Given the symptomatic nature of the right common carotid artery stenosis,  endovascular revascularization scheduled for early next week. Electronically Signed   By: Luanne Bras M.D.   On: 11/23/2019 16:25   IR ANGIO VERTEBRAL SEL VERTEBRAL UNI L MOD SED  Result Date: 11/26/2019 CLINICAL DATA:  Left-sided weakness arm greater than leg. CT angiogram of the head and neck demonstrating high-grade stenosis of the common carotid arteries bilaterally. EXAM: BILATERAL COMMON CAROTID AND INNOMINATE ANGIOGRAPHY COMPARISON:  CT angiogram of the head and neck of November 30, 2019. MEDICATIONS: Heparin 2000 units IA. No antibiotic was administered within 1 hour of the procedure. ANESTHESIA/SEDATION: Versed 0.5 mg IV; Fentanyl 12.5 mcg IV Moderate Sedation Time:  35 minutes The patient was continuously monitored during the procedure by the interventional radiology nurse under my direct supervision. CONTRAST:  Omnipaque 300 approximately 65 mL  FLUOROSCOPY TIME:  Fluoroscopy Time: 10 minutes 36 seconds (530 mGy). COMPLICATIONS: None immediate. TECHNIQUE: Informed written consent was obtained from the patient after a thorough discussion of the procedural risks, benefits and alternatives. All questions were addressed. Maximal Sterile Barrier Technique was utilized including caps, mask, sterile gowns, sterile gloves, sterile drape, hand hygiene and skin antiseptic. A timeout was performed prior to the initiation of the procedure. The right forearm to the wrist was prepped and draped in the usual sterile manner. The right radial artery was then identified with ultrasound, and its morphology documented. A dorsal palmar anastomosis was verified to be present. Using ultrasound guidance, radial access was obtained using a micropuncture set over a 0.018 inch micro guidewire. A 4/5 French radial sheath was then inserted. The micro guidewire, and the obturator were removed. Good aspiration obtained from the side port of the radial sheath. A cocktail of 1000 units of heparin, and 200 mcg of nitroglycerin  was then infused through the radial sheath without event. A radial arteriogram was performed. Over a 0.035 inch Roadrunner guidewire, a Simmons 2 diagnostic catheter was then advanced to the aortic arch region, and selectively positioned in the right common carotid artery, the left common carotid artery, the left vertebral artery and the right subclavian artery. Following the procedure, hemostasis at the right radial puncture site was achieved with a wrist band. Distal right radial pulse was verified to be present. FINDINGS: The right common carotid arteriogram demonstrates segmental high-grade stenosis of 90% of the right common carotid artery at the junction of the middle and proximal 1/3 is seen. Distal to this opacification of the right common carotid artery and the right internal carotid artery is seen. There is a mild stenosis at the origin of the right internal carotid artery. More distally the petrous, the cavernous and the supraclinoid segments are widely patent. The right middle cerebral artery and the right anterior cerebral artery opacify into the capillary and venous phases. Cross-filling via the anterior communicating artery of the left anterior cerebral A2 segment and distally is seen. No opacification of the right external carotid artery at its origin is seen. The right subclavian arteriogram demonstrates hypoplastic right vertebral artery with flow seen to the cranial skull base. Retrograde opacification of the right external carotid artery territory is seen via the ipsilateral right occipital artery. The left common carotid arteriogram demonstrates severe segmental stenosis of 90% plus at the junction of the middle and proximal third of the left common carotid artery. More distally post stenotic dilatation is seen. Patency is seen of the left internal carotid artery at the bulb to the cranial skull base. The petrous, the cavernous and the supraclinoid segments are widely patent. The left middle  cerebral artery and the left anterior cerebral artery opacify into the capillary and venous phases. Non opacification of the left external carotid artery is seen. The dominant left vertebral artery origin is widely patent. The vessel opacifies to the cranial skull base. Patency is seen of the left vertebrobasilar junction, and the left posterior-inferior cerebellar artery. There is retrograde opacification of the left external carotid artery distribution via multiple musculoskeletal collaterals arising from the distal left vertebral artery. More distally the left vertebrobasilar junction, the basilar artery, the posterior cerebral arteries, the superior cerebellar arteries and the anterior-inferior cerebellar arteries is seen into the capillary and venous phases. Retrograde opacification of the right vertebrobasilar junction proximal to the right posterior-inferior cerebellar artery is seen. Partial retrograde opacification via the left posterior communicating artery of  the left middle cerebral artery distribution is seen. 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 PLAN: Findings reviewed with the patient and referring neurologist. Given the symptomatic nature of the right common carotid artery stenosis, endovascular revascularization scheduled for early next week. Electronically Signed   By: Luanne Bras M.D.   On: 11/23/2019 16:25    Microbiology: Recent Results (from the past 240 hour(s))  Respiratory Panel by RT PCR (Flu A&B, Covid) - Nasopharyngeal Swab     Status: None   Collection Time: 12/10/19  4:19 PM   Specimen: Nasopharyngeal Swab  Result Value Ref Range Status   SARS Coronavirus 2 by RT PCR NEGATIVE NEGATIVE Final     Comment: (NOTE) SARS-CoV-2 target nucleic acids are NOT DETECTED.  The SARS-CoV-2 RNA is generally detectable in upper respiratoy specimens during the acute phase of infection. The lowest concentration of SARS-CoV-2 viral copies this assay can detect is 131 copies/mL. A negative result does not preclude SARS-Cov-2 infection and should not be used as the sole basis for treatment or other patient management decisions. A negative result may occur with  improper specimen collection/handling, submission of specimen other than nasopharyngeal swab, presence of viral mutation(s) within the areas targeted by this assay, and inadequate number of viral copies (<131 copies/mL). A negative result must be combined with clinical observations, patient history, and epidemiological information. The expected result is Negative.  Fact Sheet for Patients:  PinkCheek.be  Fact Sheet for Healthcare Providers:  GravelBags.it  This test is no t yet approved or cleared by the [place] FDA and  has been authorized for detection and/or diagnosis of SARS-CoV-2 by FDA under an Emergency Use Authorization (EUA). This EUA will remain  in effect (meaning this test can be used) for the duration of the COVID-19 declaration under Section 564(b)(1) of the Act, 21 U.S.C. section 360bbb-3(b)(1), unless the authorization is terminated or revoked sooner.     Influenza A by PCR NEGATIVE NEGATIVE Final   Influenza B by PCR NEGATIVE NEGATIVE Final    Comment: (NOTE) The Xpert Xpress SARS-CoV-2/FLU/RSV assay is intended as an aid in  the diagnosis of influenza from Nasopharyngeal swab specimens and  should not be used as a sole basis for treatment. Nasal washings and  aspirates are unacceptable for Xpert Xpress SARS-CoV-2/FLU/RSV  testing.  Fact Sheet for Patients: PinkCheek.be  Fact Sheet for Healthcare  Providers: GravelBags.it  This test is not yet approved or cleared by the [place] FDA and  has been authorized for detection and/or diagnosis of SARS-CoV-2 by  FDA under an Emergency Use Authorization (EUA). This EUA will remain  in effect (meaning this test can be used) for the duration of the  Covid-19 declaration under Section 564(b)(1) of the Act, 21  U.S.C. section 360bbb-3(b)(1), unless the authorization is  terminated or revoked. Performed at [facility], [place] [address] [place], [place] [address]      Labs: Basic Metabolic Panel: Recent Labs  Lab 12/10/19 0649 12/11/19 0348 12/12/19 1522 12/13/19 0111 12/13/19 0500  NA 140 140 136 137 138  K 3.8 4.1 4.1 4.7 4.9  CL 104 104 98  --  102  CO2 _0 --  27  GLUCOSE 114* 147* 142*  --  139*  BUN _0 --  14  CREATININE 0.64 0.59* 0.66  --  0.61  CALCIUM 8.2* 8.4* 8.7*  --  8.6*  MG  --   --  2.0  --  2.0  PHOS  --   --   --   --  4.4   Liver Function Tests: Recent Labs  Lab 12/12/19 1522 12/13/19 0500  AST 25 18  ALT 43 32  ALKPHOS 102 86  BILITOT 0.6 0.5  PROT 5.7* 5.5*  ALBUMIN 2.5* 2.4*   No results for input(s): LIPASE, AMYLASE in the last 168 hours. No results for input(s): AMMONIA in the last 168 hours. CBC: Recent Labs  Lab 12/10/19 0649 12/10/19 0649 12/11/19 0348 12/12/19 0031 12/12/19 1522 12/13/19 0111 12/13/19 0500  WBC 13.1*  --  12.9* 15.7* 21.7*  --  21.6*  NEUTROABS 11.4*  --  11.4*  --  19.9*  --   --   HGB 10.9*   < > 10.7* 11.6* 12.3* 12.2* 10.9*  HCT 34.1*   < > 33.6* 37.1* 39.8 36.0* 35.0*  MCV 86.3  --  88.4 87.5 88.1  --  88.2  PLT 467*  --  435* 427* 441*  --  348   < > = values in this interval not displayed.   Cardiac Enzymes: No results for input(s): CKTOTAL, CKMB, CKMBINDEX, TROPONINI in the last 168 hours. BNP: BNP (last 3 results) Recent Labs    11/21/19 1841 11/28/19 1016 12/13/19 0500  BNP 75.9 106.4* 177.6*     ProBNP (last 3 results) No results for input(s): PROBNP in the last 8760 hours.  CBG: Recent Labs  Lab 12/12/19 0809 12/12/19 1148 12/12/19 1636 12/12/19 2013 12/13/19 0818  GLUCAP 115* 119* 117* 122* 124*       Signed:  Dia Crawford, MD [facility] [phone] pager

## 2019-12-21 DIAGNOSIS — J449 Chronic obstructive pulmonary disease, unspecified: Secondary | ICD-10-CM | POA: Diagnosis not present

## 2019-12-21 DIAGNOSIS — R531 Weakness: Secondary | ICD-10-CM | POA: Diagnosis not present

## 2020-01-02 DEATH — deceased

## 2021-08-17 IMAGING — CT CT ANGIO NECK
2 of 7 series · 8 of 33 positions shown · IV contrast (omnipaque)
Comparison: Plain head CT 7249 hours today. CTA head and neck
02/16/2017. Recent CTA chest 11/16/2019.
COMPARISON: Plain head CT 7249 hours today. CTA head and neck
02/16/2017. Recent CTA chest 11/16/2019.

Addendum:
CLINICAL DATA: 69-year-old male with confusion, left upper
extremity weakness. Plain head CT raising the possibility of small
left parietal infarct. History of lung cancer

EXAM:
CT ANGIOGRAPHY HEAD AND NECK
TECHNIQUE: Multidetector CT imaging of the head and neck was performed using
the standard protocol during bolus administration of intravenous
contrast. Multiplanar CT image reconstructions and MIPs were
obtained to evaluate the vascular anatomy. Carotid stenosis
measurements (when applicable) are obtained utilizing NASCET
criteria, using the distal internal carotid diameter as the
denominator.
CONTRAST:  75mL OMNIPAQUE IOHEXOL 350 MG/ML SOLN

[Series 6: cta head neck · axial · 0.47mm/px · z∈[+1152,+1260]mm · 2 of 162 slices shown]
[im 54/162  soft-tissue]
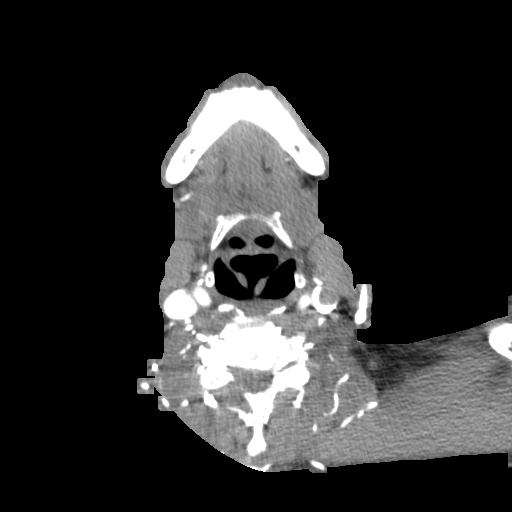
[im 108/162  soft-tissue]
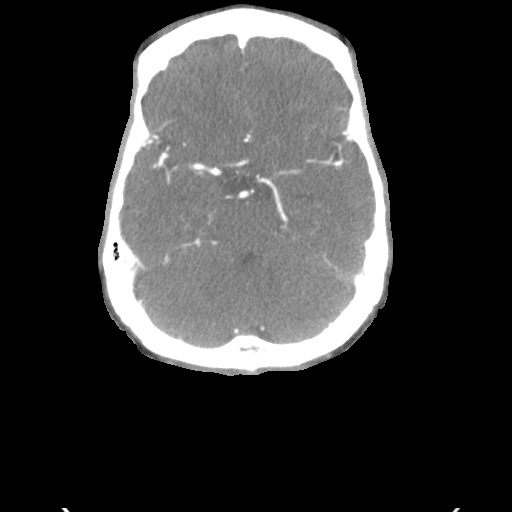

[Series 8: ax thin · axial · 0.37mm/px · z∈[+1091,+1319]mm · 6 of 320 slices shown]
[im 46/320  soft-tissue]
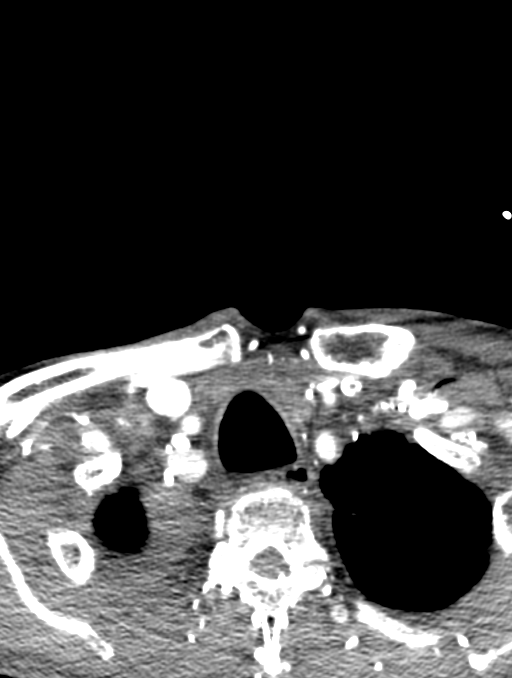
[im 92/320  bone]
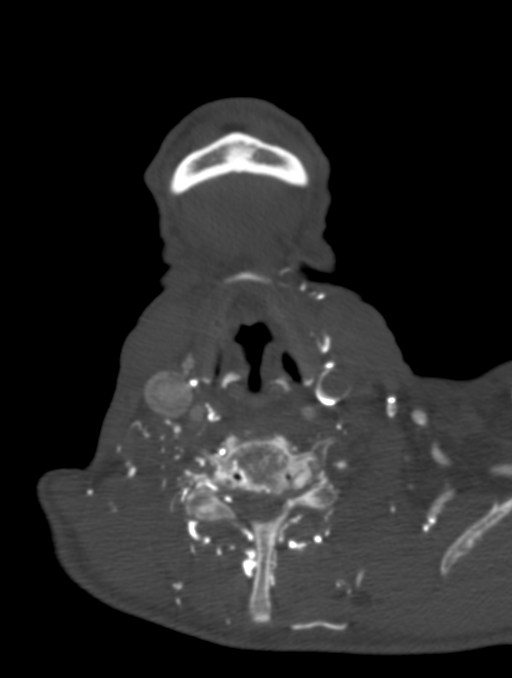
[im 137/320  soft-tissue]
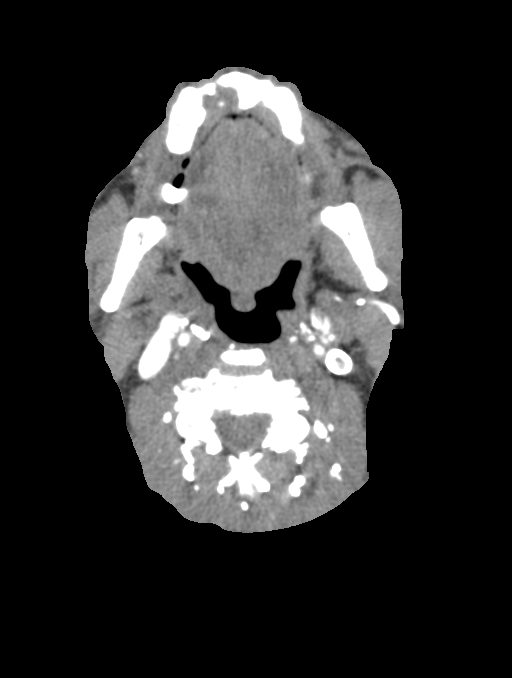
[im 183/320  bone]
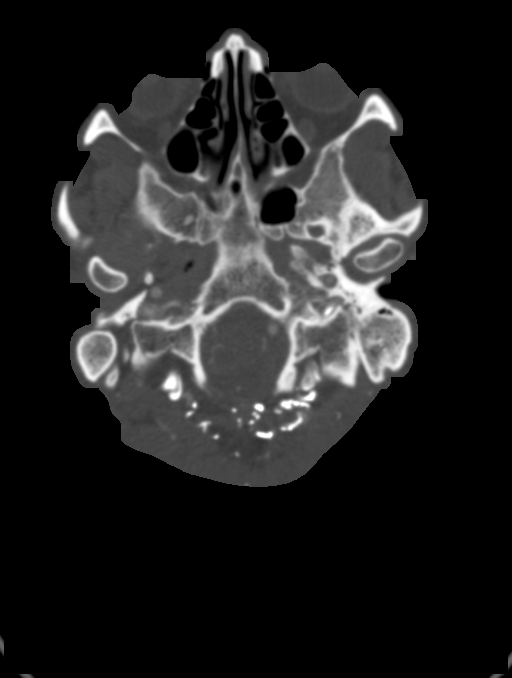
[im 228/320  soft-tissue]
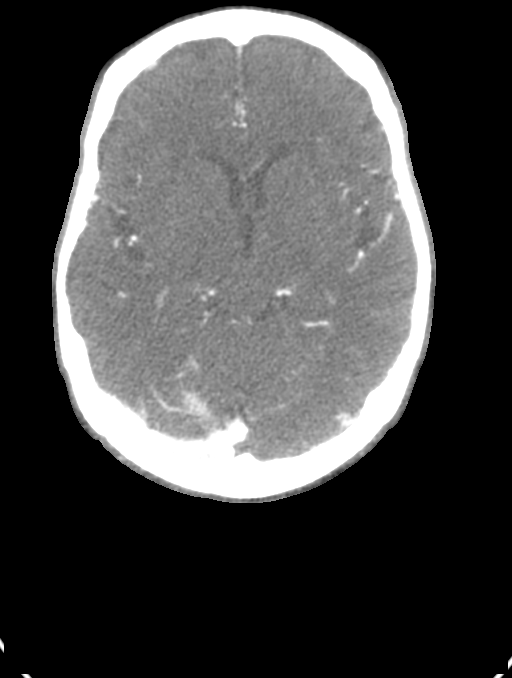
[im 274/320  bone]
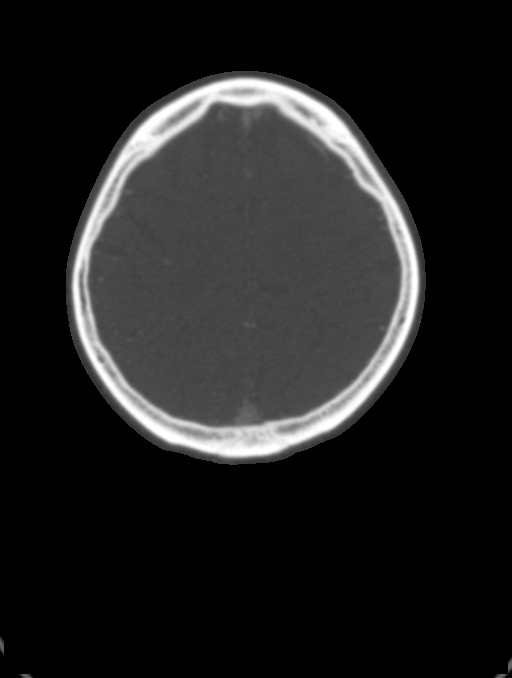

[8 of 33 positions shown; findings below may reference images not displayed]

FINDINGS: CTA NECK

Skeleton: Stable visualized osseous structures. Degenerative
appearing heterogeneous sclerosis in the cervical spine. Probable
chronic radiation osteonecrosis of the right manubrium.

Upper chest: Post radiation pneumonitis related medial upper lung
consolidation. Continued patchy opacity in the visible superior
segment of the left lower lobe (series 6, image 157) from CTA 5 days
ago.

Other neck: Limited by extensive paravertebral and other bilateral
neck venous contrast reflux. No neck mass or lymphadenopathy is
evident.

Aortic arch: Stable aortic arch since 3123 with mild
atherosclerosis. Three vessel arch configuration.

Right carotid system: Patent right CCA origin but high-grade right
CCA stenosis in the lower neck at the level of of of the thyroid
over a segment of about 14 mm, see series 9, image 158. Radiographic
string sign stenosis on series 6, image 132 this is chronic but
progressed since 3123. The right CCA remains patent. Negative right
carotid bifurcation. Right ICA is patent without significant plaque
or stenosis.

Left carotid system: Patent left CCA origin but similar abrupt
radiographic string sign stenosis of the left CCA in the lower neck
(series 6, image 135 and series 9, image 148 over a segment of about
15 mm. Despite this the left CCA remains patent. This stenosis has
not significantly changed since 3123.

Negative left carotid bifurcation.  No cervical left ICA stenosis.

Vertebral arteries:
Limited cervical vertebral artery detail related to abundant
paravertebral venous contrast, especially on the right where the
vertebral is non dominant and diminutive. The right vertebral does
remain patent to the skull base. Proximal left subclavian artery and
left vertebral origin are within normal limits. Tortuous left V1.
Dominant left vertebral artery is patent to the skull base without
visible stenosis.

CTA HEAD

Posterior circulation: Dominant left vertebral primarily supplies
the basilar. Patent left PICA origin. Diminutive right vertebral
artery is patent to the vertebrobasilar junction with patent PICA
origin, stable since 3123. Patent basilar artery without stenosis.
Patent SCA and PCA origins. Posterior communicating arteries are
diminutive or absent. Bilateral PCA branches are within normal
limits.

Anterior circulation: Both ICA siphons are patent. There is moderate
supraclinoid segment atherosclerosis on the left resulting in mild
supraclinoid stenosis. Cavernous and supraclinoid plaque and
irregularity on the right also with mild stenosis. Patent carotid
termini. Patent MCA and ACA origins. Dominant right ACA A1 as
before. Anterior communicating artery and bilateral ACA branches are
stable and within normal limits. Left MCA M1 segment and bifurcation
are patent without stenosis. Left MCA branches are stable and within
normal limits. Right MCA M1 segment and bifurcation are patent
without stenosis. Right MCA branches are stable and within normal
limits.

Venous sinuses: Intracranial venous structures are patent. Left IJ
venous reflux extends into the head as far as the torcula.

Left upper extremity contrast injection with extensive paravertebral
and other bilateral neck venous contrast reflux. This appears
related to severe stenosis of the left innominate vein. In
particular, there is a suspicious pattern of contrast reflux into
the left IJ, with the round central filling defect within the vein
(series 6, image 100) which seems to Adelita at the angle of the
mandible.

Anatomic variants: Dominant left vertebral artery primarily supplies
the basilar. Dominant right ACA A1.

Review of the MIP images confirms the above findings
IMPRESSION: 1. Negative for large vessel occlusion.

2. But positive for chronic Severe bilateral Common Carotid Artery
stenosis in the lower neck, likely post radiation related.
Bilateral CCA RADIOGRAPHIC STRING SIGNS, stable on the left but
progressed on the right since a 3123 CTA. Recommend Vascular Surgery
consultation.

3. Positive also for a pattern of venous reflux into the left
internal jugular vein suspicious for LEFT IJ THROMBOSIS below the
mandible.
Left neck Venous Doppler Ultrasound should be confirmatory.
Extensive reflux into venous collaterals throughout the neck appears
related to left upper extremity contrast injection in the setting of
severe stenosis or occlusion of the left innominate vein.

4. No carotid bifurcation plaque or stenosis. Bilateral ICA siphon
plaque with only mild stenosis. Otherwise intracranial anterior and
posterior circulation within normal limits.

5. Chronic radiation osteonecrosis of the right manubrium and post
radiation changes to the upper lungs.
Continued abnormal opacity in the superior segment of the left lower
lobe from the Chest CTA 5 days ago.

ADDENDUM:
Study discussed by telephone with Dr. Ajim in the ED on
11/21/2019 at 7747 hours.

*** End of Addendum ***
FINDINGS: CTA NECK

Skeleton: Stable visualized osseous structures. Degenerative
appearing heterogeneous sclerosis in the cervical spine. Probable
chronic radiation osteonecrosis of the right manubrium.

Upper chest: Post radiation pneumonitis related medial upper lung
consolidation. Continued patchy opacity in the visible superior
segment of the left lower lobe (series 6, image 157) from CTA 5 days
ago.

Other neck: Limited by extensive paravertebral and other bilateral
neck venous contrast reflux. No neck mass or lymphadenopathy is
evident.

Aortic arch: Stable aortic arch since 3123 with mild
atherosclerosis. Three vessel arch configuration.

Right carotid system: Patent right CCA origin but high-grade right
CCA stenosis in the lower neck at the level of of of the thyroid
over a segment of about 14 mm, see series 9, image 158. Radiographic
string sign stenosis on series 6, image 132 this is chronic but
progressed since 3123. The right CCA remains patent. Negative right
carotid bifurcation. Right ICA is patent without significant plaque
or stenosis.

Left carotid system: Patent left CCA origin but similar abrupt
radiographic string sign stenosis of the left CCA in the lower neck
(series 6, image 135 and series 9, image 148 over a segment of about
15 mm. Despite this the left CCA remains patent. This stenosis has
not significantly changed since 3123.

Negative left carotid bifurcation.  No cervical left ICA stenosis.

Vertebral arteries:
Limited cervical vertebral artery detail related to abundant
paravertebral venous contrast, especially on the right where the
vertebral is non dominant and diminutive. The right vertebral does
remain patent to the skull base. Proximal left subclavian artery and
left vertebral origin are within normal limits. Tortuous left V1.
Dominant left vertebral artery is patent to the skull base without
visible stenosis.

CTA HEAD

Posterior circulation: Dominant left vertebral primarily supplies
the basilar. Patent left PICA origin. Diminutive right vertebral
artery is patent to the vertebrobasilar junction with patent PICA
origin, stable since 3123. Patent basilar artery without stenosis.
Patent SCA and PCA origins. Posterior communicating arteries are
diminutive or absent. Bilateral PCA branches are within normal
limits.

Anterior circulation: Both ICA siphons are patent. There is moderate
supraclinoid segment atherosclerosis on the left resulting in mild
supraclinoid stenosis. Cavernous and supraclinoid plaque and
irregularity on the right also with mild stenosis. Patent carotid
termini. Patent MCA and ACA origins. Dominant right ACA A1 as
before. Anterior communicating artery and bilateral ACA branches are
stable and within normal limits. Left MCA M1 segment and bifurcation
are patent without stenosis. Left MCA branches are stable and within
normal limits. Right MCA M1 segment and bifurcation are patent
without stenosis. Right MCA branches are stable and within normal
limits.

Venous sinuses: Intracranial venous structures are patent. Left IJ
venous reflux extends into the head as far as the torcula.

Left upper extremity contrast injection with extensive paravertebral
and other bilateral neck venous contrast reflux. This appears
related to severe stenosis of the left innominate vein. In
particular, there is a suspicious pattern of contrast reflux into
the left IJ, with the round central filling defect within the vein
(series 6, image 100) which seems to Adelita at the angle of the
mandible.

Anatomic variants: Dominant left vertebral artery primarily supplies
the basilar. Dominant right ACA A1.

Review of the MIP images confirms the above findings
IMPRESSION: 1. Negative for large vessel occlusion.

2. But positive for chronic Severe bilateral Common Carotid Artery
stenosis in the lower neck, likely post radiation related.
Bilateral CCA RADIOGRAPHIC STRING SIGNS, stable on the left but
progressed on the right since a 3123 CTA. Recommend Vascular Surgery
consultation.

3. Positive also for a pattern of venous reflux into the left
internal jugular vein suspicious for LEFT IJ THROMBOSIS below the
mandible.
Left neck Venous Doppler Ultrasound should be confirmatory.
Extensive reflux into venous collaterals throughout the neck appears
related to left upper extremity contrast injection in the setting of
severe stenosis or occlusion of the left innominate vein.

4. No carotid bifurcation plaque or stenosis. Bilateral ICA siphon
plaque with only mild stenosis. Otherwise intracranial anterior and
posterior circulation within normal limits.

5. Chronic radiation osteonecrosis of the right manubrium and post
radiation changes to the upper lungs.
Continued abnormal opacity in the superior segment of the left lower
lobe from the Chest CTA 5 days ago.

## 2021-08-17 IMAGING — MR MR HEAD W/O CM
11 series · 47 of 48 positions shown · non-contrast
Comparison: Prior CT from earlier the same day.

CLINICAL DATA: Initial evaluation for neuro deficit, stroke
suspected.

EXAM:
MRI HEAD WITHOUT CONTRAST
TECHNIQUE: Multiplanar, multiecho pulse sequences of the brain and surrounding
structures were obtained without intravenous contrast.

[Series 5: DWI · axial · 3.0mm · 1.36mm/px · z∈[-32,+126]mm · 11 of 108 slices shown (1 of 4)]
[im 1/108]
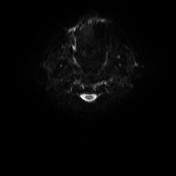
[im 11/108]
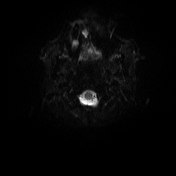
[im 22/108]
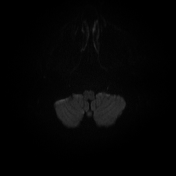
[im 33/108]
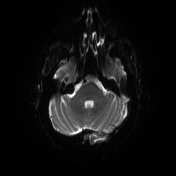
[im 43/108]
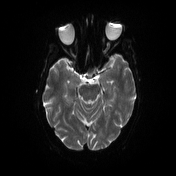
[im 54/108]
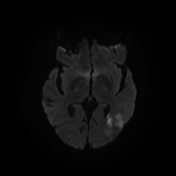
[im 65/108]
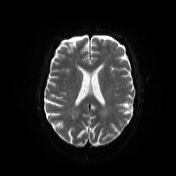
[im 75/108]
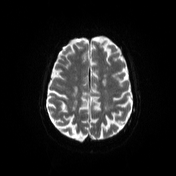
[im 86/108]
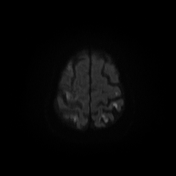
[im 97/108]
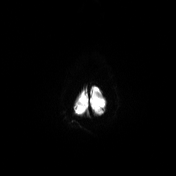
[im 108/108]
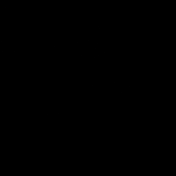

[Series 6: DWI · axial · 3.0mm · 1.36mm/px · z∈[-32,+120]mm · 5 of 52 slices shown (2 of 4)]
[im 1/52]
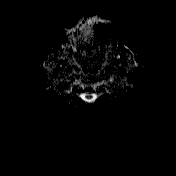
[im 13/52]
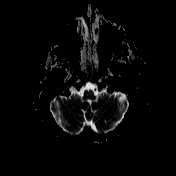
[im 26/52]
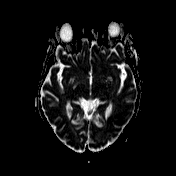
[im 39/52]
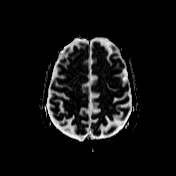
[im 52/52]
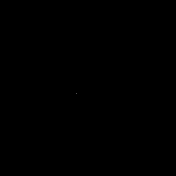

[Series 7: T1 · sagittal · 5.0mm · 0.75mm/px · 2 of 25 slices shown (1 of 2)]
[im 1/25]
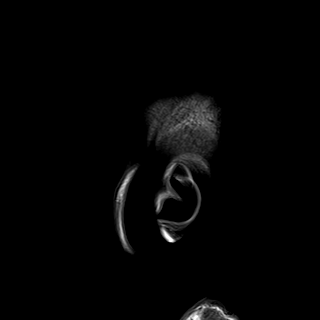
[im 25/25]
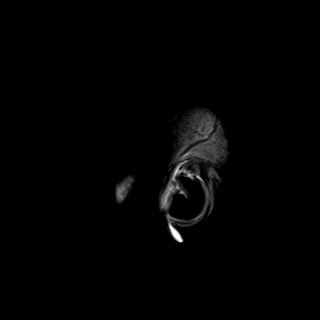

[Series 9: T2 · sagittal · 5.0mm · 0.47mm/px · 2 of 25 slices shown (1 of 3)]
[im 1/25]
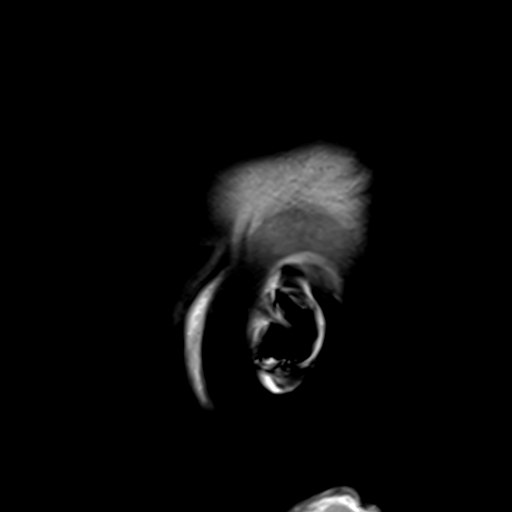
[im 25/25]
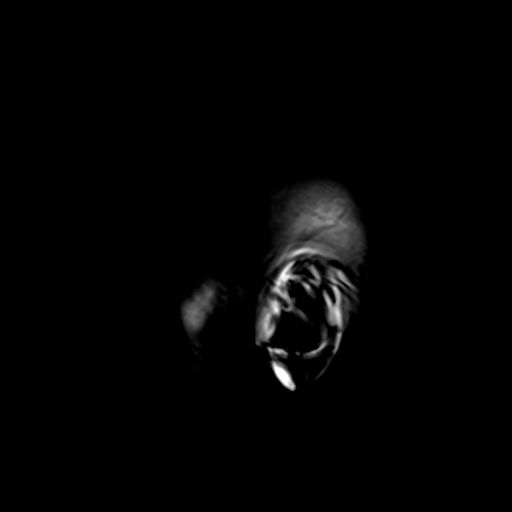

[Series 10: T2 · axial · 5.0mm · 0.45mm/px · z∈[-29,+120]mm · 2 of 24 slices shown (2 of 3)]
[im 1/24]
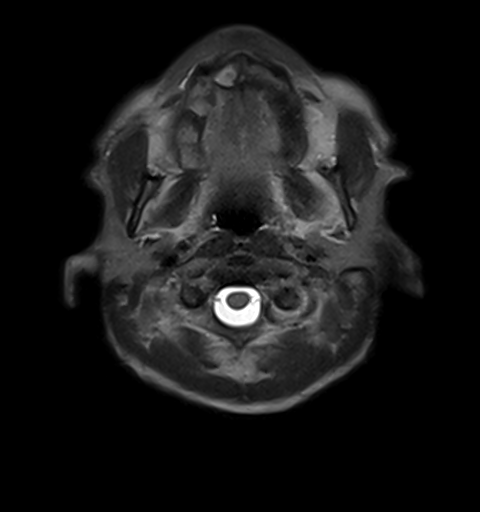
[im 24/24]
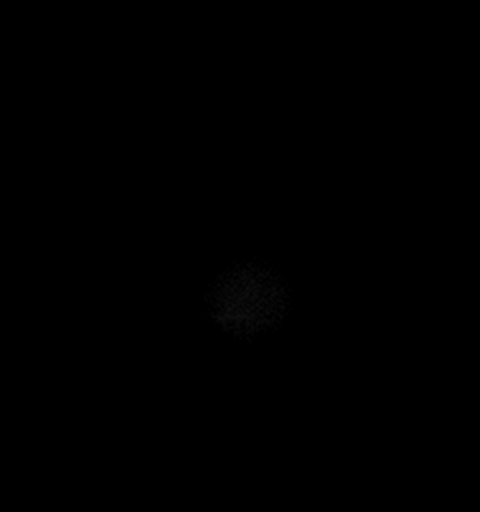

[Series 11: GRE · axial · 3.0mm · 0.45mm/px · z∈[-29,+117]mm · 5 of 50 slices shown]
[im 1/50]
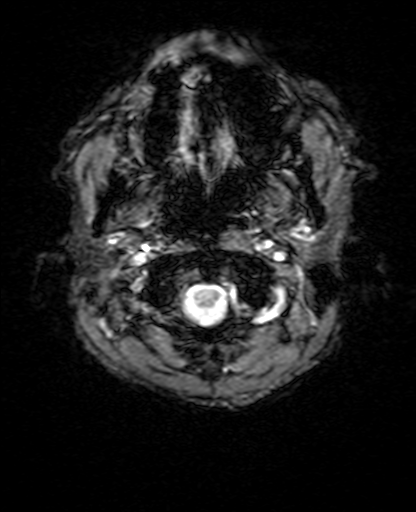
[im 13/50]
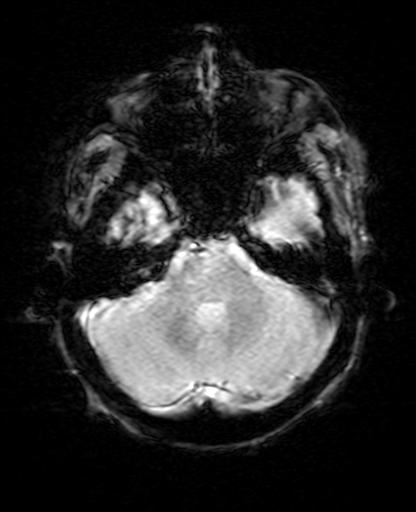
[im 25/50]
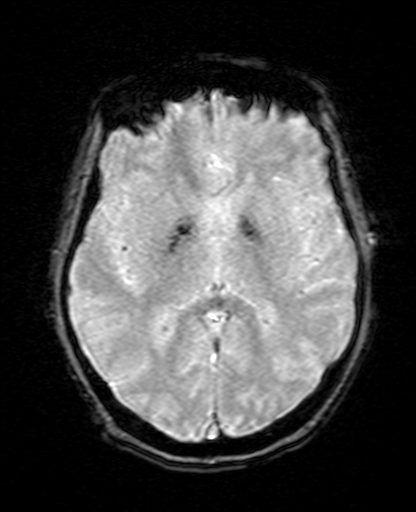
[im 37/50]
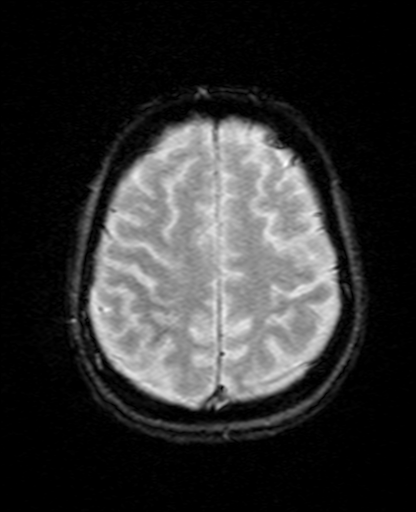
[im 50/50]
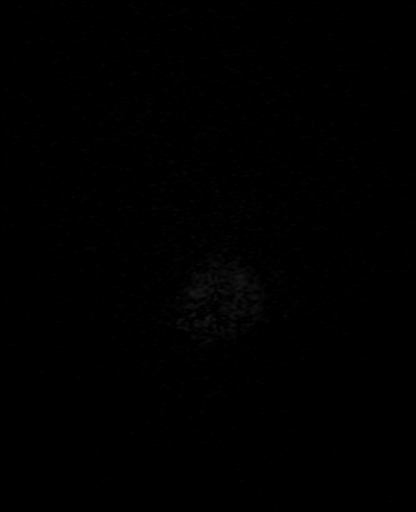

[Series 12: FLAIR · axial · 3.0mm · 0.86mm/px · z∈[-31,+118]mm · 5 of 51 slices shown]
[im 1/51]
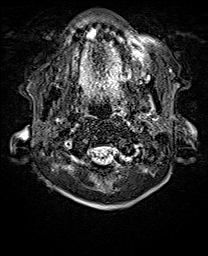
[im 13/51]
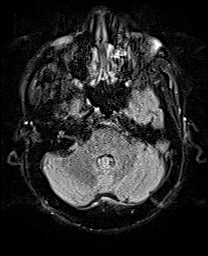
[im 26/51]
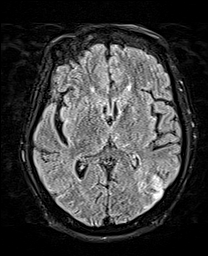
[im 38/51]
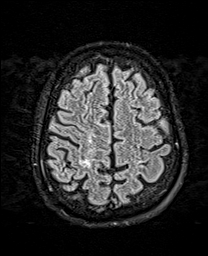
[im 51/51]
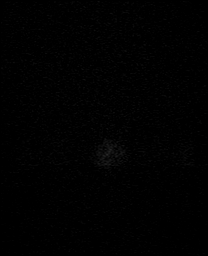

[Series 13: DWI · coronal · 5.0mm · 1.31mm/px · 5 of 56 slices shown (3 of 4)]
[im 1/56]
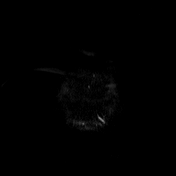
[im 14/56]
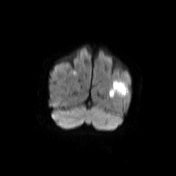
[im 28/56]
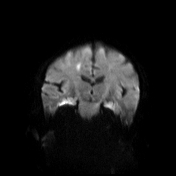
[im 42/56]
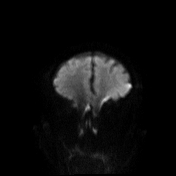
[im 56/56]
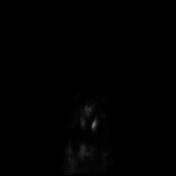

[Series 14: DWI · coronal · 5.0mm · 1.31mm/px · 3 of 28 slices shown (4 of 4)]
[im 1/28]
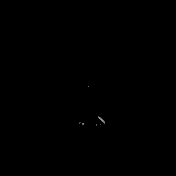
[im 14/28]
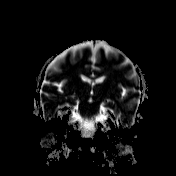
[im 28/28]
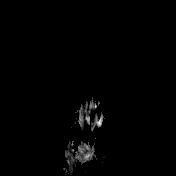

[Series 15: T1 · axial · 3.0mm · 0.45mm/px · z∈[-32,+81]mm · 4 of 52 slices shown (2 of 2)]
[im 1/52]
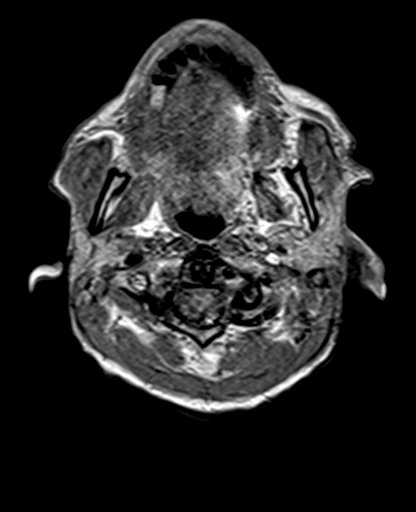
[im 13/52]
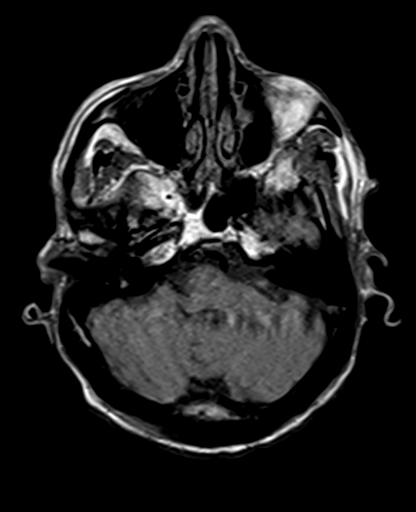
[im 26/52]
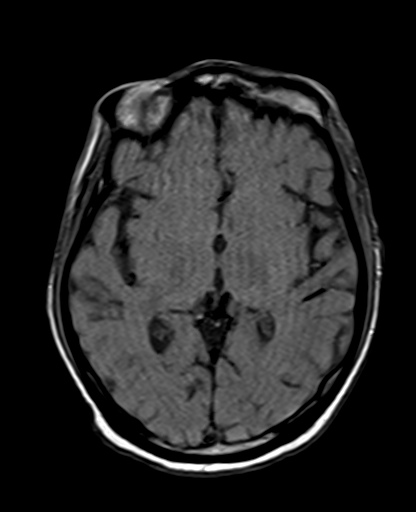
[im 39/52]
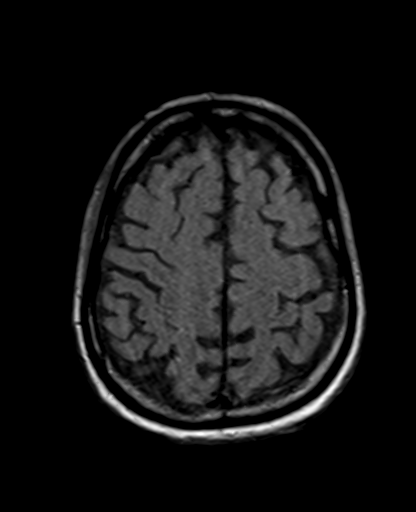

[Series 16: T2 · coronal · 5.0mm · 0.86mm/px · 3 of 28 slices shown (3 of 3)]
[im 1/28]
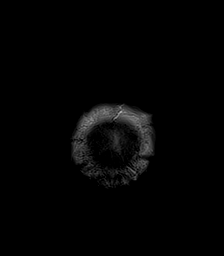
[im 14/28]
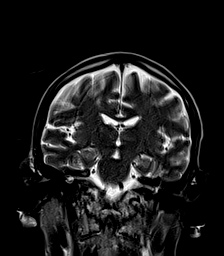
[im 28/28]
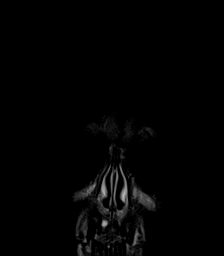

[47 of 48 positions shown; findings below may reference images not displayed]

FINDINGS: Brain: Examination degraded by motion artifact.

Cerebral volume within normal limits for age. Single tiny remote
left cerebellar infarct noted.

Patchy and confluent area of restricted diffusion seen involving the
left temporal occipital region, consistent with an acute ischemic
infarct (series 5, image 84). Associated minimal petechial
hemorrhage without hemorrhagic transformation or significant mass
effect. Additional scattered predominantly subcentimeter cortical
and subcortical ischemic infarcts seen involving the frontal and
parietal lobes bilaterally, somewhat linear in watershed in
distribution (series 5, images 98, 92, 89). No associated hemorrhage
or mass effect. No infratentorial ischemic changes. No other foci of
susceptibility artifact to suggest acute or chronic intracranial
hemorrhage.

No mass lesion, midline shift or mass effect. No hydrocephalus or
extra-axial fluid collection. Pituitary gland suprasellar region
within normal limits. Midline structures intact.

Vascular: Major intracranial vascular flow voids are grossly
maintained.

Skull and upper cervical spine: Craniocervical junction within
normal limits. Bone marrow signal intensity normal. No scalp soft
tissue abnormality.

Sinuses/Orbits: Globes and orbital soft tissues within normal
limits. Mild mucosal thickening noted within the maxillary sinuses.
Paranasal sinuses are otherwise clear. No mastoid effusion.

Other: None.
IMPRESSION: 1. Acute ischemic infarct involving the left temporoccipital region,
corresponding with abnormality on prior CT, and consistent with an
acute ischemic infarct. Additional scattered cortical and
subcortical infarcts involving the frontal and parietal lobes
bilaterally, watershed in distribution. Constellation of findings
suggest a recent hypotensive episode/hypoperfusion injury.
Associated minimal petechial hemorrhage at the left temporoccipital
infarct. No other associated hemorrhage or mass effect.
2. Underlying tiny remote left cerebellar infarct. Otherwise
negative brain MRI for age.

## 2021-09-01 IMAGING — DX DG CHEST 1V PORT
1 series · 1 of 1 positions shown · non-contrast
Comparison: Radiograph 11/28/2019.  CT 11/16/2019.

CLINICAL DATA: Fluid overload.  Hypotension.

EXAM:
PORTABLE CHEST 1 VIEW

[chest]
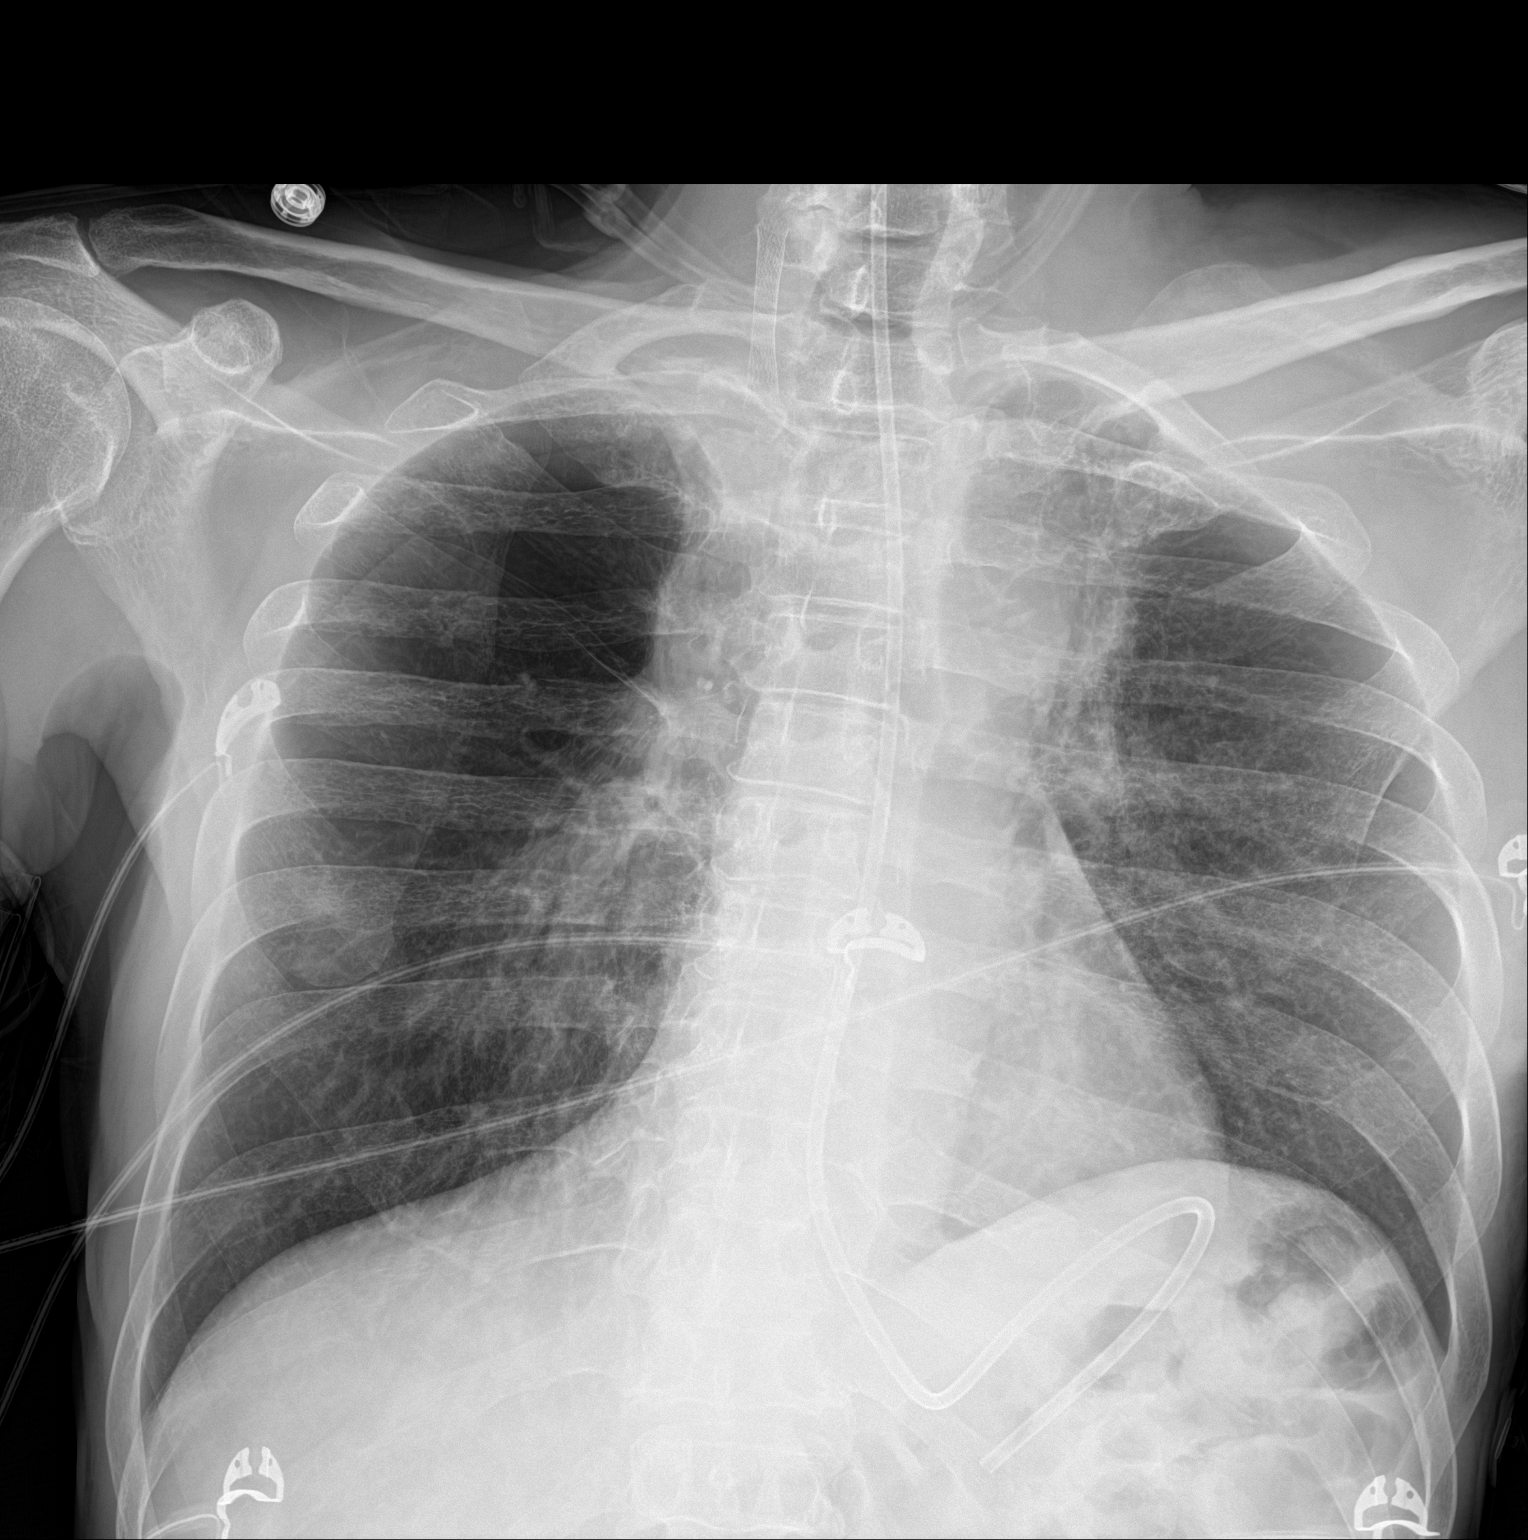

[1 of 1 positions shown; findings below may reference images not displayed]

FINDINGS: Enteric tube in place with tip and side-port below the diaphragm in
the stomach. The heart is normal in size. Stable mediastinal
contours with right hilar prominence corresponding to ill-defined
soft tissue on CT. Widening of the upper mediastinal contours
corresponds to post radiation change. Vascular stent in the region
of the right carotid. Chronic volume loss in the left hemithorax.
Slight increase in interstitial markings which may represent
pulmonary edema. No significant pleural effusion. No pneumothorax.
Nonspecific vague nodular opacity in the right mid lung. The bones
are under mineralized.
IMPRESSION: 1. Enteric tube in place with tip and side-port below the diaphragm
in the stomach.
2. Slight increase in interstitial markings which may represent
pulmonary edema given history of fluid overload.
3. Nonspecific ill-defined nodular opacity in the right mid lung.
4. Stable radiographic appearance of post treatment related changes.

## 2021-09-08 IMAGING — DX DG CHEST 1V PORT
1 series · 1 of 1 positions shown · non-contrast
Comparison: 12/12/2019

CLINICAL DATA: Respiratory distress

EXAM:
PORTABLE CHEST 1 VIEW

[chest ap]
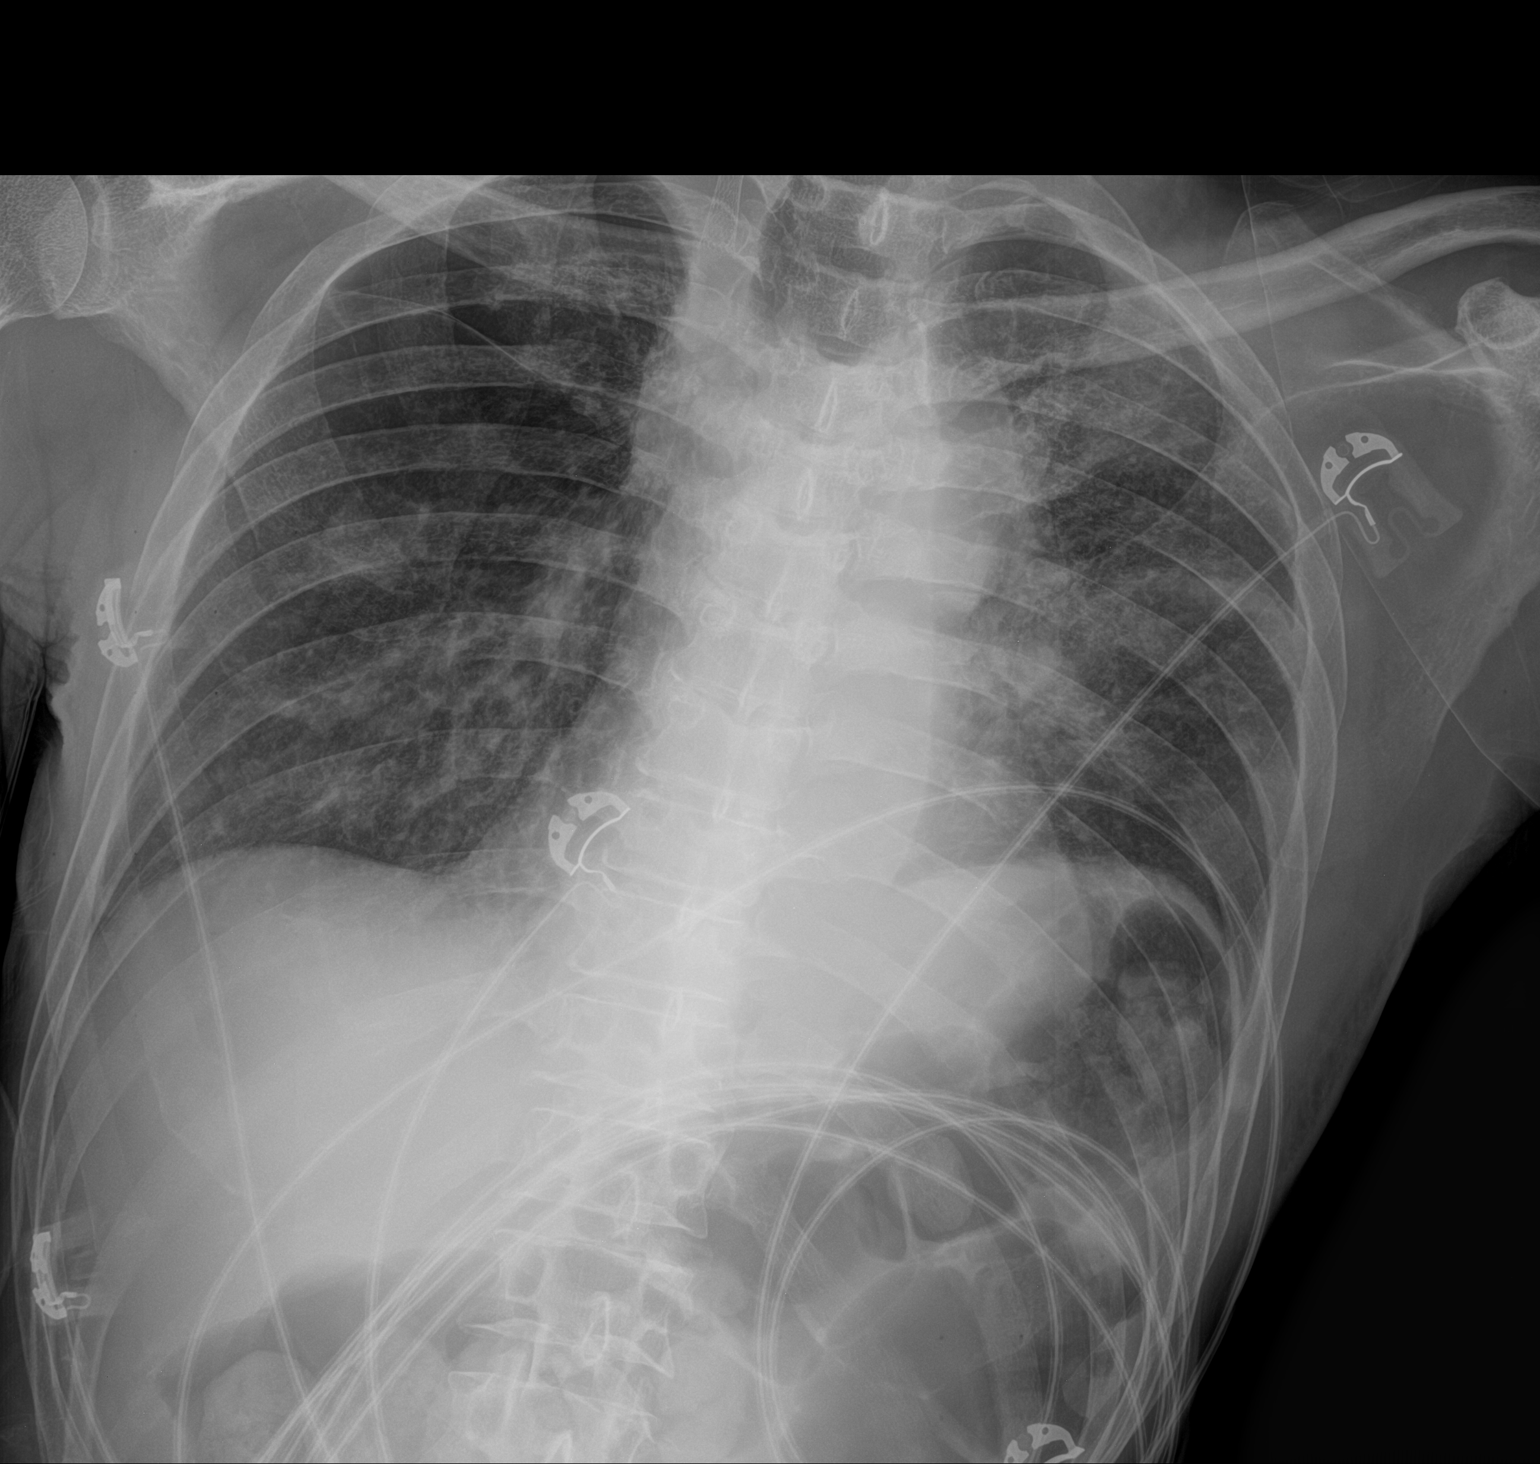

[1 of 1 positions shown; findings below may reference images not displayed]

FINDINGS: Elevation of the left hemidiaphragm and left-sided volume loss
persists. There is resultant vascular crowding within the left lung.
Right lung is clear. No pneumothorax or pleural effusion. Right
carotid stent again noted. Cardiac size within normal limits.
Paramediastinal soft tissue may relate to prior radiation therapy
and is stable. Stable superior retraction of the hila bilaterally.
No acute bone abnormality.
IMPRESSION: Stable examination with probable paramediastinal post radiation
changes at the thoracic inlet and elevation of the left
hemidiaphragm. No acute abnormality identified.
